# Patient Record
Sex: Male | Born: 1935 | Race: White | Hispanic: No | Smoking: Never smoker
Health system: Southern US, Community
[De-identification: ages and names within clinical notes are randomized; demographics above are authoritative.]

## PROBLEM LIST (undated history)

## (undated) DIAGNOSIS — I499 Cardiac arrhythmia, unspecified: Secondary | ICD-10-CM

## (undated) DIAGNOSIS — K219 Gastro-esophageal reflux disease without esophagitis: Secondary | ICD-10-CM

## (undated) DIAGNOSIS — M1990 Unspecified osteoarthritis, unspecified site: Secondary | ICD-10-CM

## (undated) DIAGNOSIS — H04123 Dry eye syndrome of bilateral lacrimal glands: Secondary | ICD-10-CM

## (undated) DIAGNOSIS — E119 Type 2 diabetes mellitus without complications: Secondary | ICD-10-CM

## (undated) DIAGNOSIS — N390 Urinary tract infection, site not specified: Secondary | ICD-10-CM

## (undated) DIAGNOSIS — R011 Cardiac murmur, unspecified: Secondary | ICD-10-CM

## (undated) DIAGNOSIS — Z9221 Personal history of antineoplastic chemotherapy: Secondary | ICD-10-CM

## (undated) DIAGNOSIS — N400 Enlarged prostate without lower urinary tract symptoms: Secondary | ICD-10-CM

## (undated) DIAGNOSIS — R682 Dry mouth, unspecified: Secondary | ICD-10-CM

## (undated) DIAGNOSIS — K4090 Unilateral inguinal hernia, without obstruction or gangrene, not specified as recurrent: Secondary | ICD-10-CM

## (undated) DIAGNOSIS — H9319 Tinnitus, unspecified ear: Secondary | ICD-10-CM

## (undated) DIAGNOSIS — I2510 Atherosclerotic heart disease of native coronary artery without angina pectoris: Secondary | ICD-10-CM

## (undated) DIAGNOSIS — K117 Disturbances of salivary secretion: Secondary | ICD-10-CM

## (undated) DIAGNOSIS — C4490 Unspecified malignant neoplasm of skin, unspecified: Secondary | ICD-10-CM

## (undated) DIAGNOSIS — I4891 Unspecified atrial fibrillation: Secondary | ICD-10-CM

## (undated) DIAGNOSIS — E785 Hyperlipidemia, unspecified: Secondary | ICD-10-CM

## (undated) DIAGNOSIS — T7840XA Allergy, unspecified, initial encounter: Secondary | ICD-10-CM

## (undated) DIAGNOSIS — I639 Cerebral infarction, unspecified: Secondary | ICD-10-CM

## (undated) DIAGNOSIS — H9190 Unspecified hearing loss, unspecified ear: Secondary | ICD-10-CM

## (undated) DIAGNOSIS — I10 Essential (primary) hypertension: Secondary | ICD-10-CM

## (undated) DIAGNOSIS — Z923 Personal history of irradiation: Secondary | ICD-10-CM

## (undated) DIAGNOSIS — C679 Malignant neoplasm of bladder, unspecified: Secondary | ICD-10-CM

## (undated) DIAGNOSIS — Z950 Presence of cardiac pacemaker: Secondary | ICD-10-CM

## (undated) DIAGNOSIS — J189 Pneumonia, unspecified organism: Secondary | ICD-10-CM

## (undated) DIAGNOSIS — N189 Chronic kidney disease, unspecified: Secondary | ICD-10-CM

## (undated) DIAGNOSIS — C801 Malignant (primary) neoplasm, unspecified: Secondary | ICD-10-CM

## (undated) HISTORY — DX: Dry mouth, unspecified: R68.2

## (undated) HISTORY — DX: Allergy, unspecified, initial encounter: T78.40XA

## (undated) HISTORY — DX: Cardiac murmur, unspecified: R01.1

## (undated) HISTORY — DX: Malignant (primary) neoplasm, unspecified: C80.1

## (undated) HISTORY — DX: Personal history of irradiation: Z92.3

## (undated) HISTORY — DX: Unspecified atrial fibrillation: I48.91

## (undated) HISTORY — PX: BACK SURGERY: SHX140

## (undated) HISTORY — DX: Disturbances of salivary secretion: K11.7

## (undated) HISTORY — DX: Personal history of antineoplastic chemotherapy: Z92.21

## (undated) HISTORY — DX: Hyperlipidemia, unspecified: E78.5

## (undated) HISTORY — DX: Unspecified osteoarthritis, unspecified site: M19.90

## (undated) HISTORY — PX: PAROTIDECTOMY: SUR1003

## (undated) HISTORY — DX: Essential (primary) hypertension: I10

## (undated) HISTORY — DX: Malignant neoplasm of bladder, unspecified: C67.9

## (undated) HISTORY — DX: Benign prostatic hyperplasia without lower urinary tract symptoms: N40.0

---

## 1978-06-22 HISTORY — PX: NEUROFIBROMA EXCISION: SHX2089

## 1988-06-22 DIAGNOSIS — I4891 Unspecified atrial fibrillation: Secondary | ICD-10-CM

## 1988-06-22 HISTORY — DX: Unspecified atrial fibrillation: I48.91

## 1988-06-22 HISTORY — PX: PACEMAKER INSERTION: SHX728

## 1997-10-19 ENCOUNTER — Emergency Department (HOSPITAL_COMMUNITY): Admission: EM | Admit: 1997-10-19 | Discharge: 1997-10-19 | Payer: Self-pay | Admitting: Emergency Medicine

## 1998-04-04 ENCOUNTER — Emergency Department (HOSPITAL_COMMUNITY): Admission: EM | Admit: 1998-04-04 | Discharge: 1998-04-04 | Payer: Self-pay | Admitting: *Deleted

## 1998-08-30 ENCOUNTER — Other Ambulatory Visit: Admission: RE | Admit: 1998-08-30 | Discharge: 1998-08-30 | Payer: Self-pay | Admitting: Urology

## 1999-04-11 ENCOUNTER — Encounter: Admission: RE | Admit: 1999-04-11 | Discharge: 1999-04-11 | Payer: Self-pay | Admitting: *Deleted

## 2000-05-04 ENCOUNTER — Ambulatory Visit (HOSPITAL_COMMUNITY): Admission: RE | Admit: 2000-05-04 | Discharge: 2000-05-04 | Payer: Self-pay | Admitting: Gastroenterology

## 2000-10-21 ENCOUNTER — Encounter: Payer: Self-pay | Admitting: Cardiovascular Disease

## 2000-10-21 ENCOUNTER — Ambulatory Visit (HOSPITAL_COMMUNITY): Admission: RE | Admit: 2000-10-21 | Discharge: 2000-10-21 | Payer: Self-pay | Admitting: Cardiovascular Disease

## 2001-06-22 DIAGNOSIS — I639 Cerebral infarction, unspecified: Secondary | ICD-10-CM

## 2001-06-22 HISTORY — DX: Cerebral infarction, unspecified: I63.9

## 2001-08-03 ENCOUNTER — Encounter: Payer: Self-pay | Admitting: Emergency Medicine

## 2001-08-04 ENCOUNTER — Inpatient Hospital Stay (HOSPITAL_COMMUNITY): Admission: EM | Admit: 2001-08-04 | Discharge: 2001-08-05 | Payer: Self-pay | Admitting: Emergency Medicine

## 2001-10-04 ENCOUNTER — Inpatient Hospital Stay (HOSPITAL_COMMUNITY): Admission: EM | Admit: 2001-10-04 | Discharge: 2001-10-07 | Payer: Self-pay | Admitting: Emergency Medicine

## 2001-10-04 ENCOUNTER — Encounter: Payer: Self-pay | Admitting: Emergency Medicine

## 2001-10-18 ENCOUNTER — Ambulatory Visit (HOSPITAL_COMMUNITY): Admission: RE | Admit: 2001-10-18 | Discharge: 2001-10-18 | Payer: Self-pay | Admitting: Neurosurgery

## 2001-11-10 ENCOUNTER — Encounter: Admission: RE | Admit: 2001-11-10 | Discharge: 2001-11-10 | Payer: Self-pay | Admitting: Neurosurgery

## 2002-04-20 ENCOUNTER — Ambulatory Visit (HOSPITAL_COMMUNITY): Admission: RE | Admit: 2002-04-20 | Discharge: 2002-04-20 | Payer: Self-pay | Admitting: Gastroenterology

## 2002-05-23 ENCOUNTER — Ambulatory Visit (HOSPITAL_COMMUNITY): Admission: RE | Admit: 2002-05-23 | Discharge: 2002-05-24 | Payer: Self-pay | Admitting: Cardiovascular Disease

## 2002-05-23 ENCOUNTER — Encounter: Payer: Self-pay | Admitting: Cardiovascular Disease

## 2002-05-23 HISTORY — PX: PACEMAKER GENERATOR CHANGE: SHX5998

## 2002-05-24 ENCOUNTER — Encounter: Payer: Self-pay | Admitting: Cardiovascular Disease

## 2003-10-10 ENCOUNTER — Emergency Department (HOSPITAL_COMMUNITY): Admission: EM | Admit: 2003-10-10 | Discharge: 2003-10-10 | Payer: Self-pay | Admitting: Family Medicine

## 2006-12-08 ENCOUNTER — Encounter: Admission: RE | Admit: 2006-12-08 | Discharge: 2006-12-08 | Payer: Self-pay | Admitting: Otolaryngology

## 2007-01-12 ENCOUNTER — Ambulatory Visit (HOSPITAL_COMMUNITY): Admission: RE | Admit: 2007-01-12 | Discharge: 2007-01-13 | Payer: Self-pay | Admitting: Otolaryngology

## 2007-01-12 ENCOUNTER — Encounter (INDEPENDENT_AMBULATORY_CARE_PROVIDER_SITE_OTHER): Payer: Self-pay | Admitting: Otolaryngology

## 2007-01-26 ENCOUNTER — Ambulatory Visit: Payer: Self-pay | Admitting: Oncology

## 2007-02-08 ENCOUNTER — Ambulatory Visit: Admission: RE | Admit: 2007-02-08 | Discharge: 2007-03-15 | Payer: Self-pay | Admitting: Radiation Oncology

## 2007-02-09 LAB — COMPREHENSIVE METABOLIC PANEL
Albumin: 4.1 g/dL (ref 3.5–5.2)
BUN: 14 mg/dL (ref 6–23)
CO2: 29 mEq/L (ref 19–32)
Calcium: 9.6 mg/dL (ref 8.4–10.5)
Chloride: 100 mEq/L (ref 96–112)
Glucose, Bld: 109 mg/dL — ABNORMAL HIGH (ref 70–99)
Potassium: 4.9 mEq/L (ref 3.5–5.3)

## 2007-02-09 LAB — CBC WITH DIFFERENTIAL/PLATELET
Basophils Absolute: 0 10*3/uL (ref 0.0–0.1)
Eosinophils Absolute: 0.1 10*3/uL (ref 0.0–0.5)
HGB: 14.2 g/dL (ref 13.0–17.1)
MCV: 99.3 fL — ABNORMAL HIGH (ref 81.6–98.0)
MONO#: 0.7 10*3/uL (ref 0.1–0.9)
NEUT#: 4.6 10*3/uL (ref 1.5–6.5)
RDW: 15.1 % — ABNORMAL HIGH (ref 11.2–14.6)
lymph#: 1.7 10*3/uL (ref 0.9–3.3)

## 2007-02-11 ENCOUNTER — Ambulatory Visit (HOSPITAL_COMMUNITY): Admission: RE | Admit: 2007-02-11 | Discharge: 2007-02-11 | Payer: Self-pay | Admitting: Oncology

## 2007-02-14 ENCOUNTER — Encounter: Admission: RE | Admit: 2007-02-14 | Discharge: 2007-02-14 | Payer: Self-pay | Admitting: Dentistry

## 2007-02-14 ENCOUNTER — Ambulatory Visit: Payer: Self-pay | Admitting: Dentistry

## 2007-02-16 ENCOUNTER — Ambulatory Visit: Payer: Self-pay | Admitting: Dentistry

## 2007-02-17 ENCOUNTER — Ambulatory Visit (HOSPITAL_COMMUNITY): Admission: RE | Admit: 2007-02-17 | Discharge: 2007-02-17 | Payer: Self-pay | Admitting: Oncology

## 2007-02-18 ENCOUNTER — Ambulatory Visit (HOSPITAL_COMMUNITY): Admission: RE | Admit: 2007-02-18 | Discharge: 2007-02-18 | Payer: Self-pay | Admitting: Oncology

## 2007-02-18 LAB — CBC WITH DIFFERENTIAL/PLATELET
BASO%: 0.5 % (ref 0.0–2.0)
Eosinophils Absolute: 0.1 10*3/uL (ref 0.0–0.5)
HCT: 36.7 % — ABNORMAL LOW (ref 38.7–49.9)
LYMPH%: 27.2 % (ref 14.0–48.0)
MCHC: 35.1 g/dL (ref 32.0–35.9)
MONO#: 0.3 10*3/uL (ref 0.1–0.9)
NEUT%: 64.2 % (ref 40.0–75.0)
Platelets: 165 10*3/uL (ref 145–400)
WBC: 5.3 10*3/uL (ref 4.0–10.0)

## 2007-02-25 LAB — CBC WITH DIFFERENTIAL/PLATELET
Basophils Absolute: 0 10*3/uL (ref 0.0–0.1)
Eosinophils Absolute: 0 10*3/uL (ref 0.0–0.5)
HGB: 13.9 g/dL (ref 13.0–17.1)
MCV: 98.5 fL — ABNORMAL HIGH (ref 81.6–98.0)
MONO#: 0.6 10*3/uL (ref 0.1–0.9)
MONO%: 6.3 % (ref 0.0–13.0)
NEUT#: 7.7 10*3/uL — ABNORMAL HIGH (ref 1.5–6.5)
RBC: 3.95 10*6/uL — ABNORMAL LOW (ref 4.20–5.71)
RDW: 14.2 % (ref 11.2–14.6)
WBC: 10.2 10*3/uL — ABNORMAL HIGH (ref 4.0–10.0)
lymph#: 1.8 10*3/uL (ref 0.9–3.3)

## 2007-03-04 LAB — CBC WITH DIFFERENTIAL/PLATELET
Basophils Absolute: 0 10*3/uL (ref 0.0–0.1)
Eosinophils Absolute: 0 10*3/uL (ref 0.0–0.5)
HCT: 37.5 % — ABNORMAL LOW (ref 38.7–49.9)
HGB: 13.4 g/dL (ref 13.0–17.1)
LYMPH%: 23.7 % (ref 14.0–48.0)
MCV: 97.2 fL (ref 81.6–98.0)
MONO#: 0.5 10*3/uL (ref 0.1–0.9)
MONO%: 6.1 % (ref 0.0–13.0)
NEUT#: 5.9 10*3/uL (ref 1.5–6.5)
NEUT%: 70.2 % (ref 40.0–75.0)
Platelets: 163 10*3/uL (ref 145–400)
RBC: 3.85 10*6/uL — ABNORMAL LOW (ref 4.20–5.71)
WBC: 8.4 10*3/uL (ref 4.0–10.0)

## 2007-03-10 LAB — CBC WITH DIFFERENTIAL/PLATELET
BASO%: 0.2 % (ref 0.0–2.0)
HCT: 36.9 % — ABNORMAL LOW (ref 38.7–49.9)
LYMPH%: 32.6 % (ref 14.0–48.0)
MCHC: 35.1 g/dL (ref 32.0–35.9)
MCV: 98.5 fL — ABNORMAL HIGH (ref 81.6–98.0)
MONO#: 0.7 10*3/uL (ref 0.1–0.9)
MONO%: 12.1 % (ref 0.0–13.0)
NEUT%: 55 % (ref 40.0–75.0)
Platelets: 233 10*3/uL (ref 145–400)
RBC: 3.75 10*6/uL — ABNORMAL LOW (ref 4.20–5.71)
WBC: 6 10*3/uL (ref 4.0–10.0)

## 2007-03-10 LAB — COMPREHENSIVE METABOLIC PANEL
Alkaline Phosphatase: 83 U/L (ref 39–117)
BUN: 12 mg/dL (ref 6–23)
CO2: 26 mEq/L (ref 19–32)
Creatinine, Ser: 0.79 mg/dL (ref 0.40–1.50)
Glucose, Bld: 104 mg/dL — ABNORMAL HIGH (ref 70–99)
Total Bilirubin: 0.5 mg/dL (ref 0.3–1.2)

## 2007-03-10 LAB — LACTATE DEHYDROGENASE: LDH: 153 U/L (ref 94–250)

## 2007-03-13 ENCOUNTER — Ambulatory Visit: Payer: Self-pay | Admitting: Oncology

## 2007-03-17 LAB — CBC WITH DIFFERENTIAL/PLATELET
BASO%: 0 % (ref 0.0–2.0)
Eosinophils Absolute: 0.1 10*3/uL (ref 0.0–0.5)
HCT: 35.6 % — ABNORMAL LOW (ref 38.7–49.9)
HGB: 12.6 g/dL — ABNORMAL LOW (ref 13.0–17.1)
MCHC: 35.4 g/dL (ref 32.0–35.9)
MONO#: 0.9 10*3/uL (ref 0.1–0.9)
NEUT#: 15.5 10*3/uL — ABNORMAL HIGH (ref 1.5–6.5)
NEUT%: 83.4 % — ABNORMAL HIGH (ref 40.0–75.0)
WBC: 18.6 10*3/uL — ABNORMAL HIGH (ref 4.0–10.0)
lymph#: 2.1 10*3/uL (ref 0.9–3.3)

## 2007-03-24 LAB — CBC WITH DIFFERENTIAL/PLATELET
Basophils Absolute: 0.1 10*3/uL (ref 0.0–0.1)
EOS%: 1 % (ref 0.0–7.0)
HCT: 34.1 % — ABNORMAL LOW (ref 38.7–49.9)
HGB: 12.2 g/dL — ABNORMAL LOW (ref 13.0–17.1)
MCH: 34.8 pg — ABNORMAL HIGH (ref 28.0–33.4)
MONO#: 0.4 10*3/uL (ref 0.1–0.9)
NEUT%: 62.6 % (ref 40.0–75.0)
Platelets: 131 10*3/uL — ABNORMAL LOW (ref 145–400)
lymph#: 1.8 10*3/uL (ref 0.9–3.3)

## 2007-04-01 LAB — CBC WITH DIFFERENTIAL/PLATELET
BASO%: 0.1 % (ref 0.0–2.0)
EOS%: 0.6 % (ref 0.0–7.0)
HCT: 33.9 % — ABNORMAL LOW (ref 38.7–49.9)
LYMPH%: 26.8 % (ref 14.0–48.0)
MCH: 35.3 pg — ABNORMAL HIGH (ref 28.0–33.4)
MCHC: 35.3 g/dL (ref 32.0–35.9)
MONO%: 8.5 % (ref 0.0–13.0)
NEUT%: 64 % (ref 40.0–75.0)
Platelets: 177 10*3/uL (ref 145–400)
RBC: 3.39 10*6/uL — ABNORMAL LOW (ref 4.20–5.71)
WBC: 5.7 10*3/uL (ref 4.0–10.0)

## 2007-04-01 LAB — COMPREHENSIVE METABOLIC PANEL
ALT: 13 U/L (ref 0–53)
AST: 18 U/L (ref 0–37)
Alkaline Phosphatase: 88 U/L (ref 39–117)
Creatinine, Ser: 0.8 mg/dL (ref 0.40–1.50)
Sodium: 140 mEq/L (ref 135–145)
Total Bilirubin: 0.8 mg/dL (ref 0.3–1.2)
Total Protein: 6.7 g/dL (ref 6.0–8.3)

## 2007-04-01 LAB — LACTATE DEHYDROGENASE: LDH: 159 U/L (ref 94–250)

## 2007-04-05 ENCOUNTER — Ambulatory Visit: Admission: RE | Admit: 2007-04-05 | Discharge: 2007-06-21 | Payer: Self-pay | Admitting: Radiation Oncology

## 2007-04-07 LAB — CBC WITH DIFFERENTIAL/PLATELET
Basophils Absolute: 0 10*3/uL (ref 0.0–0.1)
EOS%: 0.3 % (ref 0.0–7.0)
HCT: 32.9 % — ABNORMAL LOW (ref 38.7–49.9)
HGB: 11.6 g/dL — ABNORMAL LOW (ref 13.0–17.1)
MCH: 35.3 pg — ABNORMAL HIGH (ref 28.0–33.4)
MCV: 99.9 fL — ABNORMAL HIGH (ref 81.6–98.0)
MONO%: 1 % (ref 0.0–13.0)
NEUT%: 83.1 % — ABNORMAL HIGH (ref 40.0–75.0)

## 2007-04-14 LAB — CBC WITH DIFFERENTIAL/PLATELET
Eosinophils Absolute: 0 10*3/uL (ref 0.0–0.5)
HCT: 31.7 % — ABNORMAL LOW (ref 38.7–49.9)
LYMPH%: 36.9 % (ref 14.0–48.0)
MCHC: 34.9 g/dL (ref 32.0–35.9)
MCV: 100.3 fL — ABNORMAL HIGH (ref 81.6–98.0)
MONO#: 0.3 10*3/uL (ref 0.1–0.9)
MONO%: 7 % (ref 0.0–13.0)
NEUT%: 55.8 % (ref 40.0–75.0)
Platelets: 147 10*3/uL (ref 145–400)
RBC: 3.16 10*6/uL — ABNORMAL LOW (ref 4.20–5.71)
WBC: 5 10*3/uL (ref 4.0–10.0)

## 2007-04-22 LAB — CBC WITH DIFFERENTIAL/PLATELET
Basophils Absolute: 0 10*3/uL (ref 0.0–0.1)
EOS%: 0.3 % (ref 0.0–7.0)
HCT: 30.1 % — ABNORMAL LOW (ref 38.7–49.9)
HGB: 10.9 g/dL — ABNORMAL LOW (ref 13.0–17.1)
LYMPH%: 39.5 % (ref 14.0–48.0)
MCH: 36.7 pg — ABNORMAL HIGH (ref 28.0–33.4)
MCV: 101.6 fL — ABNORMAL HIGH (ref 81.6–98.0)
NEUT%: 52.7 % (ref 40.0–75.0)
Platelets: 107 10*3/uL — ABNORMAL LOW (ref 145–400)
lymph#: 1.9 10*3/uL (ref 0.9–3.3)

## 2007-04-22 LAB — COMPREHENSIVE METABOLIC PANEL
AST: 17 U/L (ref 0–37)
BUN: 15 mg/dL (ref 6–23)
Calcium: 8.9 mg/dL (ref 8.4–10.5)
Chloride: 104 mEq/L (ref 96–112)
Creatinine, Ser: 0.82 mg/dL (ref 0.40–1.50)
Total Bilirubin: 0.7 mg/dL (ref 0.3–1.2)

## 2007-04-28 ENCOUNTER — Ambulatory Visit: Payer: Self-pay | Admitting: Oncology

## 2007-04-28 LAB — CBC WITH DIFFERENTIAL/PLATELET
Basophils Absolute: 0 10*3/uL (ref 0.0–0.1)
Eosinophils Absolute: 0 10*3/uL (ref 0.0–0.5)
HCT: 31.2 % — ABNORMAL LOW (ref 38.7–49.9)
HGB: 11 g/dL — ABNORMAL LOW (ref 13.0–17.1)
LYMPH%: 30.9 % (ref 14.0–48.0)
MCV: 103 fL — ABNORMAL HIGH (ref 81.6–98.0)
MONO#: 0.5 10*3/uL (ref 0.1–0.9)
NEUT#: 2.7 10*3/uL (ref 1.5–6.5)
Platelets: 312 10*3/uL (ref 145–400)
RBC: 3.03 10*6/uL — ABNORMAL LOW (ref 4.20–5.71)
WBC: 4.7 10*3/uL (ref 4.0–10.0)

## 2007-05-04 LAB — COMPREHENSIVE METABOLIC PANEL
CO2: 27 mEq/L (ref 19–32)
Creatinine, Ser: 0.76 mg/dL (ref 0.40–1.50)
Glucose, Bld: 95 mg/dL (ref 70–99)
Total Bilirubin: 1.3 mg/dL — ABNORMAL HIGH (ref 0.3–1.2)

## 2007-05-04 LAB — CBC WITH DIFFERENTIAL/PLATELET
BASO%: 0.3 % (ref 0.0–2.0)
Eosinophils Absolute: 0 10*3/uL (ref 0.0–0.5)
HCT: 30.9 % — ABNORMAL LOW (ref 38.7–49.9)
LYMPH%: 21.1 % (ref 14.0–48.0)
MCHC: 35.4 g/dL (ref 32.0–35.9)
MCV: 105 fL — ABNORMAL HIGH (ref 81.6–98.0)
MONO#: 0.5 10*3/uL (ref 0.1–0.9)
MONO%: 11.2 % (ref 0.0–13.0)
NEUT%: 67 % (ref 40.0–75.0)
Platelets: 310 10*3/uL (ref 145–400)
WBC: 4.9 10*3/uL (ref 4.0–10.0)

## 2007-05-04 LAB — LACTATE DEHYDROGENASE: LDH: 172 U/L (ref 94–250)

## 2007-05-04 LAB — VITAMIN B12: Vitamin B-12: 1365 pg/mL — ABNORMAL HIGH (ref 211–911)

## 2007-05-12 LAB — CBC WITH DIFFERENTIAL/PLATELET
BASO%: 0.5 % (ref 0.0–2.0)
HCT: 33 % — ABNORMAL LOW (ref 38.7–49.9)
HGB: 11.8 g/dL — ABNORMAL LOW (ref 13.0–17.1)
MCHC: 35.7 g/dL (ref 32.0–35.9)
MONO#: 0.5 10*3/uL (ref 0.1–0.9)
NEUT%: 65.5 % (ref 40.0–75.0)
RDW: 20.5 % — ABNORMAL HIGH (ref 11.2–14.6)
WBC: 4.1 10*3/uL (ref 4.0–10.0)
lymph#: 0.9 10*3/uL (ref 0.9–3.3)

## 2007-05-23 LAB — COMPREHENSIVE METABOLIC PANEL
Albumin: 4.2 g/dL (ref 3.5–5.2)
BUN: 16 mg/dL (ref 6–23)
CO2: 26 mEq/L (ref 19–32)
Calcium: 9.6 mg/dL (ref 8.4–10.5)
Chloride: 102 mEq/L (ref 96–112)
Creatinine, Ser: 0.78 mg/dL (ref 0.40–1.50)
Glucose, Bld: 107 mg/dL — ABNORMAL HIGH (ref 70–99)
Potassium: 4.4 mEq/L (ref 3.5–5.3)

## 2007-05-23 LAB — CBC WITH DIFFERENTIAL/PLATELET
Basophils Absolute: 0 10*3/uL (ref 0.0–0.1)
EOS%: 0.4 % (ref 0.0–7.0)
Eosinophils Absolute: 0 10*3/uL (ref 0.0–0.5)
HCT: 31.3 % — ABNORMAL LOW (ref 38.7–49.9)
HGB: 11.2 g/dL — ABNORMAL LOW (ref 13.0–17.1)
MCH: 38.8 pg — ABNORMAL HIGH (ref 28.0–33.4)
MONO#: 0.4 10*3/uL (ref 0.1–0.9)
NEUT#: 2.8 10*3/uL (ref 1.5–6.5)
NEUT%: 70.2 % (ref 40.0–75.0)
lymph#: 0.7 10*3/uL — ABNORMAL LOW (ref 0.9–3.3)

## 2007-05-23 LAB — LACTATE DEHYDROGENASE: LDH: 157 U/L (ref 94–250)

## 2007-05-31 LAB — CBC WITH DIFFERENTIAL/PLATELET
Basophils Absolute: 0 10*3/uL (ref 0.0–0.1)
Eosinophils Absolute: 0 10*3/uL (ref 0.0–0.5)
HGB: 11.3 g/dL — ABNORMAL LOW (ref 13.0–17.1)
NEUT#: 2.4 10*3/uL (ref 1.5–6.5)
RBC: 2.85 10*6/uL — ABNORMAL LOW (ref 4.20–5.71)
RDW: 17.2 % — ABNORMAL HIGH (ref 11.2–14.6)
WBC: 3.5 10*3/uL — ABNORMAL LOW (ref 4.0–10.0)
lymph#: 0.7 10*3/uL — ABNORMAL LOW (ref 0.9–3.3)

## 2007-06-08 LAB — CBC WITH DIFFERENTIAL/PLATELET
BASO%: 0.2 % (ref 0.0–2.0)
EOS%: 1.2 % (ref 0.0–7.0)
HCT: 30 % — ABNORMAL LOW (ref 38.7–49.9)
HGB: 10.7 g/dL — ABNORMAL LOW (ref 13.0–17.1)
MCH: 39.8 pg — ABNORMAL HIGH (ref 28.0–33.4)
MCHC: 35.8 g/dL (ref 32.0–35.9)
MONO#: 0.2 10*3/uL (ref 0.1–0.9)
NEUT%: 66.7 % (ref 40.0–75.0)
RDW: 15.9 % — ABNORMAL HIGH (ref 11.2–14.6)
WBC: 2.7 10*3/uL — ABNORMAL LOW (ref 4.0–10.0)
lymph#: 0.6 10*3/uL — ABNORMAL LOW (ref 0.9–3.3)

## 2007-06-15 LAB — CBC WITH DIFFERENTIAL/PLATELET
Basophils Absolute: 0 10*3/uL (ref 0.0–0.1)
EOS%: 0.7 % (ref 0.0–7.0)
Eosinophils Absolute: 0 10*3/uL (ref 0.0–0.5)
HCT: 31.9 % — ABNORMAL LOW (ref 38.7–49.9)
HGB: 11.4 g/dL — ABNORMAL LOW (ref 13.0–17.1)
MCH: 39.7 pg — ABNORMAL HIGH (ref 28.0–33.4)
MONO#: 0.3 10*3/uL (ref 0.1–0.9)
NEUT#: 2 10*3/uL (ref 1.5–6.5)
NEUT%: 64.2 % (ref 40.0–75.0)
RDW: 15.9 % — ABNORMAL HIGH (ref 11.2–14.6)
lymph#: 0.7 10*3/uL — ABNORMAL LOW (ref 0.9–3.3)

## 2007-07-06 ENCOUNTER — Ambulatory Visit: Payer: Self-pay | Admitting: Oncology

## 2007-07-08 LAB — CBC WITH DIFFERENTIAL/PLATELET
Basophils Absolute: 0 10*3/uL (ref 0.0–0.1)
EOS%: 1.1 % (ref 0.0–7.0)
Eosinophils Absolute: 0 10*3/uL (ref 0.0–0.5)
HCT: 31.5 % — ABNORMAL LOW (ref 38.7–49.9)
HGB: 10.7 g/dL — ABNORMAL LOW (ref 13.0–17.1)
LYMPH%: 20.8 % (ref 14.0–48.0)
MCH: 37.7 pg — ABNORMAL HIGH (ref 28.0–33.4)
MCV: 110.9 fL — ABNORMAL HIGH (ref 81.6–98.0)
MONO%: 13 % (ref 0.0–13.0)
NEUT#: 2.4 10*3/uL (ref 1.5–6.5)
NEUT%: 64.9 % (ref 40.0–75.0)
Platelets: 127 10*3/uL — ABNORMAL LOW (ref 145–400)
RDW: 15.1 % — ABNORMAL HIGH (ref 11.2–14.6)

## 2007-07-08 LAB — COMPREHENSIVE METABOLIC PANEL
AST: 17 U/L (ref 0–37)
Albumin: 3.9 g/dL (ref 3.5–5.2)
Alkaline Phosphatase: 69 U/L (ref 39–117)
BUN: 15 mg/dL (ref 6–23)
Creatinine, Ser: 0.86 mg/dL (ref 0.40–1.50)
Glucose, Bld: 103 mg/dL — ABNORMAL HIGH (ref 70–99)
Potassium: 4.2 mEq/L (ref 3.5–5.3)

## 2007-08-03 ENCOUNTER — Ambulatory Visit (HOSPITAL_COMMUNITY): Admission: RE | Admit: 2007-08-03 | Discharge: 2007-08-03 | Payer: Self-pay | Admitting: Oncology

## 2007-08-08 LAB — COMPREHENSIVE METABOLIC PANEL
AST: 18 U/L (ref 0–37)
BUN: 16 mg/dL (ref 6–23)
Calcium: 9.3 mg/dL (ref 8.4–10.5)
Chloride: 102 mEq/L (ref 96–112)
Creatinine, Ser: 0.86 mg/dL (ref 0.40–1.50)

## 2007-08-08 LAB — CBC WITH DIFFERENTIAL/PLATELET
Basophils Absolute: 0 10*3/uL (ref 0.0–0.1)
EOS%: 1.2 % (ref 0.0–7.0)
HCT: 36.1 % — ABNORMAL LOW (ref 38.7–49.9)
HGB: 11.6 g/dL — ABNORMAL LOW (ref 13.0–17.1)
MCH: 35.3 pg — ABNORMAL HIGH (ref 28.0–33.4)
MCV: 109.8 fL — ABNORMAL HIGH (ref 81.6–98.0)
NEUT%: 66.5 % (ref 40.0–75.0)
Platelets: 186 10*3/uL (ref 145–400)
lymph#: 0.9 10*3/uL (ref 0.9–3.3)

## 2007-08-08 LAB — LACTATE DEHYDROGENASE: LDH: 163 U/L (ref 94–250)

## 2007-09-28 ENCOUNTER — Ambulatory Visit: Payer: Self-pay | Admitting: Oncology

## 2007-10-03 LAB — CBC WITH DIFFERENTIAL/PLATELET
BASO%: 0.1 % (ref 0.0–2.0)
Basophils Absolute: 0 10*3/uL (ref 0.0–0.1)
EOS%: 2 % (ref 0.0–7.0)
HCT: 34.1 % — ABNORMAL LOW (ref 38.7–49.9)
LYMPH%: 34 % (ref 14.0–48.0)
MCH: 37.8 pg — ABNORMAL HIGH (ref 28.0–33.4)
MCHC: 35.6 g/dL (ref 32.0–35.9)
MCV: 106.1 fL — ABNORMAL HIGH (ref 81.6–98.0)
MONO%: 8.3 % (ref 0.0–13.0)
NEUT%: 55.6 % (ref 40.0–75.0)
Platelets: 152 10*3/uL (ref 145–400)
lymph#: 1.2 10*3/uL (ref 0.9–3.3)

## 2007-10-03 LAB — COMPREHENSIVE METABOLIC PANEL
ALT: 13 U/L (ref 0–53)
AST: 16 U/L (ref 0–37)
BUN: 11 mg/dL (ref 6–23)
Creatinine, Ser: 0.78 mg/dL (ref 0.40–1.50)
Total Bilirubin: 0.6 mg/dL (ref 0.3–1.2)

## 2007-11-23 ENCOUNTER — Ambulatory Visit: Payer: Self-pay | Admitting: Oncology

## 2007-11-25 LAB — LACTATE DEHYDROGENASE: LDH: 154 U/L (ref 94–250)

## 2007-11-25 LAB — CBC WITH DIFFERENTIAL/PLATELET
BASO%: 0.2 % (ref 0.0–2.0)
HCT: 34.4 % — ABNORMAL LOW (ref 38.7–49.9)
LYMPH%: 27.4 % (ref 14.0–48.0)
MCH: 36.6 pg — ABNORMAL HIGH (ref 28.0–33.4)
MCHC: 35.3 g/dL (ref 32.0–35.9)
MONO#: 0.3 10*3/uL (ref 0.1–0.9)
NEUT%: 62.8 % (ref 40.0–75.0)
Platelets: 147 10*3/uL (ref 145–400)
WBC: 3.8 10*3/uL — ABNORMAL LOW (ref 4.0–10.0)

## 2007-11-25 LAB — COMPREHENSIVE METABOLIC PANEL
ALT: 12 U/L (ref 0–53)
AST: 21 U/L (ref 0–37)
CO2: 27 mEq/L (ref 19–32)
Sodium: 141 mEq/L (ref 135–145)
Total Bilirubin: 1 mg/dL (ref 0.3–1.2)
Total Protein: 7 g/dL (ref 6.0–8.3)

## 2008-01-26 ENCOUNTER — Ambulatory Visit: Payer: Self-pay | Admitting: Oncology

## 2008-01-30 LAB — CBC WITH DIFFERENTIAL/PLATELET
BASO%: 0.2 % (ref 0.0–2.0)
EOS%: 1.8 % (ref 0.0–7.0)
LYMPH%: 29 % (ref 14.0–48.0)
MCHC: 35 g/dL (ref 32.0–35.9)
MCV: 105 fL — ABNORMAL HIGH (ref 81.6–98.0)
MONO%: 11.6 % (ref 0.0–13.0)
Platelets: 152 10*3/uL (ref 145–400)
RBC: 3.57 10*6/uL — ABNORMAL LOW (ref 4.20–5.71)

## 2008-01-30 LAB — COMPREHENSIVE METABOLIC PANEL
ALT: 15 U/L (ref 0–53)
AST: 19 U/L (ref 0–37)
Alkaline Phosphatase: 72 U/L (ref 39–117)
Sodium: 140 mEq/L (ref 135–145)
Total Bilirubin: 1 mg/dL (ref 0.3–1.2)
Total Protein: 7 g/dL (ref 6.0–8.3)

## 2008-03-28 ENCOUNTER — Ambulatory Visit: Payer: Self-pay | Admitting: Oncology

## 2008-03-30 LAB — COMPREHENSIVE METABOLIC PANEL
Albumin: 4.2 g/dL (ref 3.5–5.2)
CO2: 23 mEq/L (ref 19–32)
Calcium: 9.1 mg/dL (ref 8.4–10.5)
Glucose, Bld: 95 mg/dL (ref 70–99)
Potassium: 3.8 mEq/L (ref 3.5–5.3)
Sodium: 138 mEq/L (ref 135–145)
Total Protein: 7 g/dL (ref 6.0–8.3)

## 2008-03-30 LAB — CBC WITH DIFFERENTIAL/PLATELET
Eosinophils Absolute: 0.1 10*3/uL (ref 0.0–0.5)
HGB: 12.9 g/dL — ABNORMAL LOW (ref 13.0–17.1)
MONO#: 0.5 10*3/uL (ref 0.1–0.9)
NEUT#: 4.5 10*3/uL (ref 1.5–6.5)
Platelets: 162 10*3/uL (ref 145–400)
RBC: 3.57 10*6/uL — ABNORMAL LOW (ref 4.20–5.71)
RDW: 14.2 % (ref 11.2–14.6)
WBC: 6.4 10*3/uL (ref 4.0–10.0)

## 2008-05-10 ENCOUNTER — Ambulatory Visit: Admission: RE | Admit: 2008-05-10 | Discharge: 2008-05-10 | Payer: Self-pay | Admitting: Radiation Oncology

## 2008-05-14 ENCOUNTER — Ambulatory Visit (HOSPITAL_BASED_OUTPATIENT_CLINIC_OR_DEPARTMENT_OTHER): Admission: RE | Admit: 2008-05-14 | Discharge: 2008-05-15 | Payer: Self-pay | Admitting: Urology

## 2008-05-14 ENCOUNTER — Encounter (INDEPENDENT_AMBULATORY_CARE_PROVIDER_SITE_OTHER): Payer: Self-pay | Admitting: Urology

## 2008-05-17 ENCOUNTER — Emergency Department (HOSPITAL_COMMUNITY): Admission: EM | Admit: 2008-05-17 | Discharge: 2008-05-17 | Payer: Self-pay | Admitting: *Deleted

## 2008-05-17 ENCOUNTER — Emergency Department (HOSPITAL_COMMUNITY): Admission: EM | Admit: 2008-05-17 | Discharge: 2008-05-17 | Payer: Self-pay | Admitting: Emergency Medicine

## 2008-05-24 ENCOUNTER — Ambulatory Visit: Payer: Self-pay | Admitting: Oncology

## 2008-06-18 ENCOUNTER — Encounter (INDEPENDENT_AMBULATORY_CARE_PROVIDER_SITE_OTHER): Payer: Self-pay | Admitting: Urology

## 2008-06-18 ENCOUNTER — Ambulatory Visit (HOSPITAL_BASED_OUTPATIENT_CLINIC_OR_DEPARTMENT_OTHER): Admission: RE | Admit: 2008-06-18 | Discharge: 2008-06-19 | Payer: Self-pay | Admitting: Urology

## 2008-06-22 DIAGNOSIS — C679 Malignant neoplasm of bladder, unspecified: Secondary | ICD-10-CM

## 2008-06-22 HISTORY — DX: Malignant neoplasm of bladder, unspecified: C67.9

## 2008-08-01 ENCOUNTER — Ambulatory Visit: Payer: Self-pay | Admitting: Oncology

## 2008-08-03 LAB — CBC WITH DIFFERENTIAL/PLATELET
Basophils Absolute: 0 10*3/uL (ref 0.0–0.1)
Eosinophils Absolute: 0.1 10*3/uL (ref 0.0–0.5)
HCT: 37.3 % — ABNORMAL LOW (ref 38.7–49.9)
HGB: 12.6 g/dL — ABNORMAL LOW (ref 13.0–17.1)
MCH: 33.9 pg — ABNORMAL HIGH (ref 28.0–33.4)
MCV: 100.2 fL — ABNORMAL HIGH (ref 81.6–98.0)
MONO%: 9.1 % (ref 0.0–13.0)
NEUT#: 3.1 10*3/uL (ref 1.5–6.5)
NEUT%: 61.4 % (ref 40.0–75.0)
Platelets: 175 10*3/uL (ref 145–400)
RDW: 14.9 % — ABNORMAL HIGH (ref 11.2–14.6)

## 2008-08-03 LAB — COMPREHENSIVE METABOLIC PANEL
Albumin: 4.4 g/dL (ref 3.5–5.2)
Alkaline Phosphatase: 97 U/L (ref 39–117)
BUN: 17 mg/dL (ref 6–23)
Calcium: 9.7 mg/dL (ref 8.4–10.5)
Creatinine, Ser: 0.96 mg/dL (ref 0.40–1.50)
Glucose, Bld: 90 mg/dL (ref 70–99)
Potassium: 4.6 mEq/L (ref 3.5–5.3)

## 2008-08-08 ENCOUNTER — Ambulatory Visit (HOSPITAL_COMMUNITY): Admission: RE | Admit: 2008-08-08 | Discharge: 2008-08-08 | Payer: Self-pay | Admitting: Oncology

## 2008-09-27 ENCOUNTER — Ambulatory Visit: Payer: Self-pay | Admitting: Oncology

## 2008-10-01 LAB — COMPREHENSIVE METABOLIC PANEL
AST: 20 U/L (ref 0–37)
Albumin: 4.1 g/dL (ref 3.5–5.2)
Alkaline Phosphatase: 116 U/L (ref 39–117)
BUN: 18 mg/dL (ref 6–23)
Glucose, Bld: 97 mg/dL (ref 70–99)
Potassium: 4.8 mEq/L (ref 3.5–5.3)
Total Bilirubin: 0.9 mg/dL (ref 0.3–1.2)

## 2008-10-01 LAB — CBC WITH DIFFERENTIAL/PLATELET
Basophils Absolute: 0 10*3/uL (ref 0.0–0.1)
EOS%: 3 % (ref 0.0–7.0)
Eosinophils Absolute: 0.2 10*3/uL (ref 0.0–0.5)
HGB: 13 g/dL (ref 13.0–17.1)
LYMPH%: 26 % (ref 14.0–49.0)
MCH: 33.1 pg (ref 27.2–33.4)
MCV: 97.6 fL (ref 79.3–98.0)
MONO%: 10.2 % (ref 0.0–14.0)
Platelets: 180 10*3/uL (ref 140–400)
RBC: 3.94 10*6/uL — ABNORMAL LOW (ref 4.20–5.82)
RDW: 16.9 % — ABNORMAL HIGH (ref 11.0–14.6)

## 2008-11-02 ENCOUNTER — Encounter (INDEPENDENT_AMBULATORY_CARE_PROVIDER_SITE_OTHER): Payer: Self-pay | Admitting: Urology

## 2008-11-02 ENCOUNTER — Ambulatory Visit (HOSPITAL_BASED_OUTPATIENT_CLINIC_OR_DEPARTMENT_OTHER): Admission: RE | Admit: 2008-11-02 | Discharge: 2008-11-02 | Payer: Self-pay | Admitting: Urology

## 2008-11-22 ENCOUNTER — Ambulatory Visit: Payer: Self-pay | Admitting: Oncology

## 2008-11-26 LAB — COMPREHENSIVE METABOLIC PANEL
ALT: 15 U/L (ref 0–53)
AST: 22 U/L (ref 0–37)
Albumin: 4.1 g/dL (ref 3.5–5.2)
Alkaline Phosphatase: 97 U/L (ref 39–117)
Calcium: 9.6 mg/dL (ref 8.4–10.5)
Chloride: 103 mEq/L (ref 96–112)
Potassium: 4.3 mEq/L (ref 3.5–5.3)

## 2008-11-26 LAB — CBC WITH DIFFERENTIAL/PLATELET
BASO%: 0.5 % (ref 0.0–2.0)
EOS%: 4.3 % (ref 0.0–7.0)
MCH: 33.6 pg — ABNORMAL HIGH (ref 27.2–33.4)
MCHC: 34.7 g/dL (ref 32.0–36.0)
MONO%: 12.4 % (ref 0.0–14.0)
RBC: 3.96 10*6/uL — ABNORMAL LOW (ref 4.20–5.82)
RDW: 15.5 % — ABNORMAL HIGH (ref 11.0–14.6)
lymph#: 1.5 10*3/uL (ref 0.9–3.3)

## 2009-01-23 ENCOUNTER — Ambulatory Visit: Payer: Self-pay | Admitting: Oncology

## 2009-03-21 ENCOUNTER — Ambulatory Visit: Payer: Self-pay | Admitting: Oncology

## 2009-03-25 LAB — COMPREHENSIVE METABOLIC PANEL
ALT: 9 U/L (ref 0–53)
Albumin: 4.1 g/dL (ref 3.5–5.2)
CO2: 27 mEq/L (ref 19–32)
Calcium: 9.4 mg/dL (ref 8.4–10.5)
Chloride: 101 mEq/L (ref 96–112)
Creatinine, Ser: 0.96 mg/dL (ref 0.40–1.50)
Potassium: 4.6 mEq/L (ref 3.5–5.3)
Sodium: 139 mEq/L (ref 135–145)
Total Protein: 6.5 g/dL (ref 6.0–8.3)

## 2009-03-25 LAB — CBC WITH DIFFERENTIAL/PLATELET
BASO%: 0.3 % (ref 0.0–2.0)
HCT: 38.2 % — ABNORMAL LOW (ref 38.4–49.9)
HGB: 13.2 g/dL (ref 13.0–17.1)
MCHC: 34.6 g/dL (ref 32.0–36.0)
MONO#: 0.6 10*3/uL (ref 0.1–0.9)
NEUT%: 65 % (ref 39.0–75.0)
WBC: 5.4 10*3/uL (ref 4.0–10.3)
lymph#: 1.2 10*3/uL (ref 0.9–3.3)

## 2009-03-25 LAB — LACTATE DEHYDROGENASE: LDH: 146 U/L (ref 94–250)

## 2009-05-14 ENCOUNTER — Ambulatory Visit: Payer: Self-pay | Admitting: Oncology

## 2009-06-05 ENCOUNTER — Ambulatory Visit: Admission: RE | Admit: 2009-06-05 | Discharge: 2009-06-05 | Payer: Self-pay | Admitting: Radiation Oncology

## 2009-06-05 LAB — TSH: TSH: 7.092 u[IU]/mL — ABNORMAL HIGH (ref 0.350–4.500)

## 2009-07-11 ENCOUNTER — Ambulatory Visit: Payer: Self-pay | Admitting: Oncology

## 2009-07-15 LAB — COMPREHENSIVE METABOLIC PANEL
ALT: 17 U/L (ref 0–53)
AST: 24 U/L (ref 0–37)
CO2: 25 mEq/L (ref 19–32)
Creatinine, Ser: 0.84 mg/dL (ref 0.40–1.50)
Total Bilirubin: 1.2 mg/dL (ref 0.3–1.2)

## 2009-07-15 LAB — CBC WITH DIFFERENTIAL/PLATELET
BASO%: 0.2 % (ref 0.0–2.0)
EOS%: 2 % (ref 0.0–7.0)
HCT: 38.8 % (ref 38.4–49.9)
LYMPH%: 29.2 % (ref 14.0–49.0)
MCH: 34.8 pg — ABNORMAL HIGH (ref 27.2–33.4)
MCHC: 33.9 g/dL (ref 32.0–36.0)
MCV: 102.6 fL — ABNORMAL HIGH (ref 79.3–98.0)
NEUT%: 58.3 % (ref 39.0–75.0)
Platelets: 168 10*3/uL (ref 140–400)

## 2009-07-15 LAB — LACTATE DEHYDROGENASE: LDH: 179 U/L (ref 94–250)

## 2009-09-05 HISTORY — PX: CARDIOVASCULAR STRESS TEST: SHX262

## 2009-09-09 ENCOUNTER — Ambulatory Visit (HOSPITAL_COMMUNITY): Admission: RE | Admit: 2009-09-09 | Discharge: 2009-09-09 | Payer: Self-pay | Admitting: Oncology

## 2009-09-10 ENCOUNTER — Ambulatory Visit: Payer: Self-pay | Admitting: Oncology

## 2009-09-12 LAB — CBC WITH DIFFERENTIAL/PLATELET
BASO%: 0.1 % (ref 0.0–2.0)
EOS%: 2 % (ref 0.0–7.0)
MCH: 34.8 pg — ABNORMAL HIGH (ref 27.2–33.4)
MCHC: 34.3 g/dL (ref 32.0–36.0)
MCV: 101.5 fL — ABNORMAL HIGH (ref 79.3–98.0)
MONO%: 9.6 % (ref 0.0–14.0)
RDW: 15 % — ABNORMAL HIGH (ref 11.0–14.6)
lymph#: 1.6 10*3/uL (ref 0.9–3.3)

## 2009-09-12 LAB — COMPREHENSIVE METABOLIC PANEL
AST: 21 U/L (ref 0–37)
Albumin: 4.3 g/dL (ref 3.5–5.2)
Alkaline Phosphatase: 133 U/L — ABNORMAL HIGH (ref 39–117)
BUN: 13 mg/dL (ref 6–23)
Potassium: 4.1 mEq/L (ref 3.5–5.3)
Sodium: 141 mEq/L (ref 135–145)
Total Bilirubin: 1.2 mg/dL (ref 0.3–1.2)
Total Protein: 7.3 g/dL (ref 6.0–8.3)

## 2009-11-06 ENCOUNTER — Ambulatory Visit: Payer: Self-pay | Admitting: Oncology

## 2009-12-31 ENCOUNTER — Ambulatory Visit: Payer: Self-pay | Admitting: Oncology

## 2010-01-02 LAB — COMPREHENSIVE METABOLIC PANEL
BUN: 12 mg/dL (ref 6–23)
CO2: 27 mEq/L (ref 19–32)
Calcium: 9 mg/dL (ref 8.4–10.5)
Chloride: 102 mEq/L (ref 96–112)
Creatinine, Ser: 0.96 mg/dL (ref 0.40–1.50)
Glucose, Bld: 94 mg/dL (ref 70–99)

## 2010-01-02 LAB — CBC WITH DIFFERENTIAL/PLATELET
Basophils Absolute: 0 10*3/uL (ref 0.0–0.1)
EOS%: 3 % (ref 0.0–7.0)
Eosinophils Absolute: 0.1 10*3/uL (ref 0.0–0.5)
HCT: 37.2 % — ABNORMAL LOW (ref 38.4–49.9)
HGB: 12.7 g/dL — ABNORMAL LOW (ref 13.0–17.1)
MCH: 34.6 pg — ABNORMAL HIGH (ref 27.2–33.4)
NEUT%: 56.9 % (ref 39.0–75.0)
lymph#: 1.2 10*3/uL (ref 0.9–3.3)

## 2010-01-02 LAB — T4: T4, Total: 5.3 ug/dL (ref 5.0–12.5)

## 2010-01-02 LAB — TSH: TSH: 4.181 u[IU]/mL (ref 0.350–4.500)

## 2010-01-02 LAB — LACTATE DEHYDROGENASE: LDH: 165 U/L (ref 94–250)

## 2010-01-02 LAB — T4, FREE: Free T4: 1.04 ng/dL (ref 0.80–1.80)

## 2010-02-28 ENCOUNTER — Ambulatory Visit: Payer: Self-pay | Admitting: Oncology

## 2010-05-01 ENCOUNTER — Ambulatory Visit: Payer: Self-pay | Admitting: Oncology

## 2010-05-05 LAB — COMPREHENSIVE METABOLIC PANEL
Alkaline Phosphatase: 103 U/L (ref 39–117)
BUN: 17 mg/dL (ref 6–23)
CO2: 26 mEq/L (ref 19–32)
Creatinine, Ser: 0.99 mg/dL (ref 0.40–1.50)
Glucose, Bld: 93 mg/dL (ref 70–99)
Sodium: 140 mEq/L (ref 135–145)
Total Bilirubin: 1.2 mg/dL (ref 0.3–1.2)

## 2010-05-05 LAB — CBC WITH DIFFERENTIAL/PLATELET
Basophils Absolute: 0 10*3/uL (ref 0.0–0.1)
EOS%: 2.7 % (ref 0.0–7.0)
Eosinophils Absolute: 0.1 10*3/uL (ref 0.0–0.5)
HGB: 13.1 g/dL (ref 13.0–17.1)
LYMPH%: 29.1 % (ref 14.0–49.0)
MCH: 34.7 pg — ABNORMAL HIGH (ref 27.2–33.4)
MCV: 100.3 fL — ABNORMAL HIGH (ref 79.3–98.0)
MONO%: 11 % (ref 0.0–14.0)
NEUT#: 2.3 10*3/uL (ref 1.5–6.5)
Platelets: 148 10*3/uL (ref 140–400)
RDW: 15.6 % — ABNORMAL HIGH (ref 11.0–14.6)

## 2010-05-05 LAB — TSH: TSH: 3.787 u[IU]/mL (ref 0.350–4.500)

## 2010-06-26 ENCOUNTER — Ambulatory Visit: Payer: Self-pay | Admitting: Oncology

## 2010-07-24 ENCOUNTER — Ambulatory Visit: Payer: Self-pay | Admitting: Radiation Oncology

## 2010-07-24 ENCOUNTER — Ambulatory Visit: Payer: Medicare Other | Attending: Radiation Oncology | Admitting: Radiation Oncology

## 2010-08-25 ENCOUNTER — Other Ambulatory Visit (HOSPITAL_COMMUNITY): Payer: Self-pay | Admitting: Oncology

## 2010-08-25 ENCOUNTER — Encounter (HOSPITAL_BASED_OUTPATIENT_CLINIC_OR_DEPARTMENT_OTHER): Payer: Medicare Other | Admitting: Oncology

## 2010-08-25 DIAGNOSIS — Z452 Encounter for adjustment and management of vascular access device: Secondary | ICD-10-CM

## 2010-08-25 DIAGNOSIS — C07 Malignant neoplasm of parotid gland: Secondary | ICD-10-CM

## 2010-08-25 LAB — CBC WITH DIFFERENTIAL/PLATELET
BASO%: 0.9 % (ref 0.0–2.0)
Eosinophils Absolute: 0.1 10*3/uL (ref 0.0–0.5)
HCT: 40.7 % (ref 38.4–49.9)
HGB: 13.9 g/dL (ref 13.0–17.1)
LYMPH%: 37.9 % (ref 14.0–49.0)
MCHC: 34.2 g/dL (ref 32.0–36.0)
MONO#: 0.5 10*3/uL (ref 0.1–0.9)
NEUT#: 2.2 10*3/uL (ref 1.5–6.5)
NEUT%: 47.5 % (ref 39.0–75.0)
Platelets: 144 10*3/uL (ref 140–400)
WBC: 4.6 10*3/uL (ref 4.0–10.3)
lymph#: 1.7 10*3/uL (ref 0.9–3.3)

## 2010-08-25 LAB — COMPREHENSIVE METABOLIC PANEL
ALT: 15 U/L (ref 0–53)
AST: 31 U/L (ref 0–37)
Albumin: 4.4 g/dL (ref 3.5–5.2)
BUN: 16 mg/dL (ref 6–23)
CO2: 21 mEq/L (ref 19–32)
Calcium: 9.2 mg/dL (ref 8.4–10.5)
Chloride: 100 mEq/L (ref 96–112)
Potassium: 3.9 mEq/L (ref 3.5–5.3)

## 2010-08-25 LAB — LACTATE DEHYDROGENASE: LDH: 226 U/L (ref 94–250)

## 2010-09-03 ENCOUNTER — Ambulatory Visit (HOSPITAL_COMMUNITY)
Admission: RE | Admit: 2010-09-03 | Discharge: 2010-09-03 | Disposition: A | Discharge status: Home / Self Care | Payer: Medicare Other | Source: Ambulatory Visit | Attending: Oncology | Admitting: Oncology

## 2010-09-03 ENCOUNTER — Other Ambulatory Visit (HOSPITAL_COMMUNITY): Payer: Self-pay | Admitting: Oncology

## 2010-09-03 DIAGNOSIS — Z950 Presence of cardiac pacemaker: Secondary | ICD-10-CM | POA: Insufficient documentation

## 2010-09-03 DIAGNOSIS — Z85819 Personal history of malignant neoplasm of unspecified site of lip, oral cavity, and pharynx: Secondary | ICD-10-CM | POA: Insufficient documentation

## 2010-09-03 DIAGNOSIS — K118 Other diseases of salivary glands: Secondary | ICD-10-CM

## 2010-09-03 DIAGNOSIS — I517 Cardiomegaly: Secondary | ICD-10-CM | POA: Insufficient documentation

## 2010-09-15 LAB — GLUCOSE, CAPILLARY: Glucose-Capillary: 119 mg/dL — ABNORMAL HIGH (ref 70–99)

## 2010-09-30 LAB — BASIC METABOLIC PANEL
BUN: 14 mg/dL (ref 6–23)
CO2: 30 mEq/L (ref 19–32)
Calcium: 9.5 mg/dL (ref 8.4–10.5)
Chloride: 102 mEq/L (ref 96–112)
Creatinine, Ser: 1 mg/dL (ref 0.4–1.5)
GFR calc Af Amer: >60 mL/min (ref >60)
GFR calc non Af Amer: >60 mL/min (ref >60)
Glucose, Bld: 99 mg/dL (ref 70–99)
Potassium: 4.5 mEq/L (ref 3.5–5.1)
Sodium: 139 mEq/L (ref 135–145)

## 2010-09-30 LAB — GLUCOSE, CAPILLARY
Glucose-Capillary: 80 mg/dL (ref 70–99)
Glucose-Capillary: 98 mg/dL (ref 70–99)

## 2010-09-30 LAB — POCT HEMOGLOBIN-HEMACUE: Hemoglobin: 14 g/dL (ref 13.0–17.0)

## 2010-10-07 LAB — GLUCOSE, CAPILLARY: Glucose-Capillary: 105 mg/dL — ABNORMAL HIGH (ref 70–99)

## 2010-10-30 ENCOUNTER — Encounter (HOSPITAL_BASED_OUTPATIENT_CLINIC_OR_DEPARTMENT_OTHER): Payer: Medicare Other | Admitting: Oncology

## 2010-10-30 DIAGNOSIS — Z452 Encounter for adjustment and management of vascular access device: Secondary | ICD-10-CM

## 2010-10-30 DIAGNOSIS — C07 Malignant neoplasm of parotid gland: Secondary | ICD-10-CM

## 2010-11-04 NOTE — Op Note (Signed)
NAMESAQUAN, [name]              ACCOUNT NO.:  [id number]   MEDICAL RECORD NO.:  [id number]          PATIENT TYPE:  AMB   LOCATION:  [facility]                         FACILITY:  [name]   PHYSICIAN:  [name], M.D.  DATE OF BIRTH:  [birth year]   DATE OF PROCEDURE:  06/18/2008  DATE OF DISCHARGE:                               OPERATIVE REPORT   PREOPERATIVE DIAGNOSES:  1. History of transitional cell carcinoma of the bladder.  2. 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  3. Benign prostatic hypertrophy with bladder outlet obstruction.   POSTOPERATIVE DIAGNOSES:  1. History of transitional cell carcinoma of the bladder.  2. 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  3. Benign prostatic hypertrophy with bladder outlet obstruction.   PROCEDURES:  1. Cystoscopy with bladder biopsy.  2. Transurethral resection of prostate.   SURGEON:  [name], M.D.   ANESTHESIA:  General.   DRAINS:  A 24-French three-way Foley catheter.   SPECIMENS:  1. Cold cup biopsy of left lateral wall of the bladder.  2. Resectional biopsy of the entire prostatic urethral mucosa.  3. The remainder of the resected prostate tissue all to pathology.   BLOOD LOSS:  Approximately 100 mL.   COMPLICATIONS:  None.   INDICATIONS:  The patient is a 77 year old male who I initially found  transitional cell carcinoma of the bladder and he underwent resection of  that in November 2009.  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  Because of the presence of transitional  cell carcinoma of the prostatic urethra, I discussed with the patient  the need to resect the prostatic urethra completely for both diagnostic  and therapeutic reasons which would also allow BCG entrance into the  prostatic urethral region if necessary.  We discussed the risks,  complications and alternatives.  He understands and has elected to  proceed.  He stopped his Plavix 10 days prior to the procedure.   DESCRIPTION OF OPERATION:  After informed consent, the patient brought  to the major OR, placed on the table, administered general anesthesia  and then moved to the dorsal lithotomy position.  His genitalia was  sterilely prepped and draped and I dilated the urethral meatus with Sissy Hoff sounds to 30-French.  I then passed a 28-French resectoscope  sheath with Timberlake obturator per urethra down into the bulbar  urethral region and removed the obturator and inserted the 12 degree  lens with the resectoscope element.  I noted the bulbar urethra appeared  normal, the sphincter intact and the prostatic urethra revealed  elongation with bilobar hypertrophy.  I then entered the bladder and  noted previous area of resection on the left wall.  The ureteral  orifices were of normal position and configuration and well away from  the bladder neck.  I began resecting first at the bladder neck at the 6  o'clock position and resected the prostatic urethral epithelium  circumferentially around at the level of the bladder  neck.  I then  resected from that level on back to the level of the veru and after  visually confirming that all the prostatic urethral mucosa had been  completely resected, I used the Ellik evacuator to then remove these  prostatic chips from the bladder and these were sent separately labeled  as prostatic urethral mucosa.   I then proceeded to resect the prostate in the standard fashion for a  TURP starting at the 6 o'clock position and resecting back to the veru,  first resecting the left lobe down to the prostatic capsule at the 6  o'clock position and then proceeding in a counterclockwise fashion from  the bladder neck back to the veru resecting all adenomatous tissue.  This was then performed on the right side of the prostate and then some   apical tissue was resected as well.  I then fulgurated all areas of  bleeding and used the Ellik evacuator to remove the remaining prostatic  chips.   I then inserted the cold cup biopsy forceps with the 12 degrees lens and  took a cold cup biopsy of the left wall of the bladder and sent this to  pathology separately.   I then reinserted the resectoscope and fulgurated the area of the biopsy  on the left wall of the bladder and then fulgurated any further bleeding  areas of the prostatic urethra which was now noted to be widely patent.  I therefore left the bladder full and removed the resectoscope and  inserted the Foley catheter filling the balloon to 30 mL and connecting  this to closed system drainage, as well as continuous irrigation.  The  patient was awakened and taken to the recovery room in stable  satisfactory condition.  He tolerated the procedure well.  There were no  intraoperative complications.  He will be observed overnight and  discharged home in the morning after his Foley catheter is removed and  he has voided.      [name], M.D.  Electronically Signed     MCO/MEDQ  D:  06/18/2008  T:  06/18/2008  Job:  [id number]

## 2010-11-04 NOTE — Op Note (Signed)
[name], [name]              ACCOUNT NO.:  [id number]   MEDICAL RECORD NO.:  [id number]          PATIENT TYPE:  AMB   LOCATION:  [facility]                          FACILITY:  [facility]   PHYSICIAN:  [name]. [name], M.D.DATE OF BIRTH:  05-10-1930   DATE OF PROCEDURE:  01/12/2007  DATE OF DISCHARGE:                               OPERATIVE REPORT   PRE AND POSTOPERATIVE DIAGNOSIS AND INDICATIONS FOR SURGERY:  Right  parotid mass.   SURGICAL PROCEDURES:  Right parotidectomy with nerve integrity  monitoring (NIMS)   SURGEON:  [name]. [name], M.D.   ASSISTANT:  Dr. Brynda Peon.   ANESTHESIA:  General endotracheal.   COMPLICATIONS:  No complications.   BLOOD LOSS:  Approximately 100 mL.   The patient transferred from the operating room to recovery room in  stable condition.   BRIEF HISTORY:  [name] is a 77 year old white male who is referred  by Dr. Barbette Or, an oral surgeon in [place] for evaluation of a  right parotid mass.  The patient had an approximately 47-month history of  swelling in the right periparotid area with pain and discomfort along  the jaw line was referred to Dr. Raiford Noble for evaluation and biopsy was  undertaken in the office under local anesthesia and a pathologic  diagnosis showed normal appearing parotid tissue.  Patient was referred  to our office for evaluation and CT scan was obtained which showed a  large intraparotid mass involving the superior aspect of the parotid  gland.  It was adjacent to the zygoma and TMJ and was heterogeneous in  nature.  Given the patient's history and examination, I recommended  right parotidectomy.  Previous biopsy most likely missed the actual  pathologic aspect of the tumor and no further biopsy was undertaken at  the patient's request.  Given his history and examination, I recommended  that we undertake parotidectomy under general anesthesia.  Risks,  benefits and possible complications of procedure discussed  in detail,  particularly risks of recurrent tumor, infection and possible injury to  the facial nerve on the right-hand side.  The patient understood and  concurred with our plan for surgery which scheduled as above.   PROCEDURE:  The patient brought to the operating room on 01/12/2007,  placed in supine position on the operating table.  General endotracheal  anesthesia was established without difficulty.  The patient adequately  anesthetized he was positioned on the operating table, prepped and  draped in sterile fashion and injected with 5 mL of 1% lidocaine  1:100,000 solution epinephrine injected along the proposed incision site  in the preauricular sulcus.  The [facility] monitor was placed at the  orbicularis oris and orbicularis oculi muscles for intraoperative  monitoring and was used throughout the case.  With the patient prepped  and draped in position, surgical procedure begun by creating a  curvilinear incision in the anterior preauricular sulcus.  This was  carried around the earlobe and the immediate postauricular area and the  upper neck in a preexisting skin crease.  Incision was carried through  the skin underlying subcutaneous tissue and  a periparotid subcutaneous  flap was then elevated along the superficial aspect of the parotid  gland.  The overlying skin was densely adherent to the parotid gland and  dissection was carried out with both monopolar and bipolar  electrocautery and blunt and sharp dissection.  The anterior aspect of  the sternocleidomastoid muscle was the identified.  Dissection was  carried from superior to inferior, elevating the parotid gland  anteriorly.  The preauricular incision was followed from superficial to  deep along the cartilaginous tragus.  The main trunk of the facial nerve  was identified as it exited the stylomastoid foramen and dissection was  carried out through the substance of the parotid gland, identifying the  upper lower  divisions and then dissect each of the individual branches.  The patient had a very fibrous parotid gland with a moderate amount of  scar tissue and bleeding inferiorly and superiorly, the patient had a  large deep mass which was adherent to the zygoma and dissection was  carried out along the superior branch preserving the individual branches  using bipolar cautery and dissecting the mass from the parotid wound  bed. The parotid specimen was sent to pathology for gross and  microscopic evaluation.  The patient's wound was thoroughly irrigated,  several areas of hemorrhage were cauterized with bipolar cautery.  At  conclusion of procedure the NIMS monitor was used to stimulate the nerve  branches, the inferior division stimulated very well, superior division  had minimal response at 0.50 mA.  The patient's wound was again  irrigated and a 7-mm Blake drain was placed at the depth of the incision  through a separate stab incision and sutured in position with 3-0  Ethilon suture.  The patient's wound was then closed with 4-0 and 5-0  Vicryl sutures in interrupted fashion and superficial skin closure  achieved with 5-0 with 6-0 Ethilon suture in running locked fashion.  The patient's wound was cleaned and dressed with bacitracin ointment.  He was awakened from his anesthetic, extubated, was transferred to the  operating room to recovery in stable condition.  No complications.  Blood loss less than 100 mL.           ______________________________  [name]. [name], M.D.     DLS/MEDQ  D:  16/03/9603  T:  01/13/2007  Job:  [id number]

## 2010-11-04 NOTE — Op Note (Signed)
NAMESEMAJ, COBURN              ACCOUNT NO.:  [id number]   MEDICAL RECORD NO.:  [id number]          PATIENT TYPE:  AMB   LOCATION:  [facility]                         FACILITY:  [facility]   PHYSICIAN:  Mark C. [name], M.D.  DATE OF BIRTH:  December 04, 1929   DATE OF PROCEDURE:  11/02/2008  DATE OF DISCHARGE:                               OPERATIVE REPORT   PREOPERATIVE DIAGNOSES:  History of transitional cell carcinoma of the  bladder, as well as carcinoma in situ.   POSTOPERATIVE DIAGNOSES:  History of transitional cell carcinoma of the  bladder, as well as carcinoma in situ.   PROCEDURES:  Cystoscopy and bladder biopsy.   ANESTHESIA:  General.   BLOOD LOSS:  Minimal.   SPECIMENS:  Cold cup bladder biopsies from the right wall, left wall,  posterior wall and bladder base to pathology.   DRAINS:  None.   COMPLICATIONS:  None.   INDICATIONS:  The patient is a 77 year old male with a history of  transitional cell carcinoma of the bladder, as well as carcinoma in  situ.  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  He underwent 6 weeks of intravesical BCG, and now has returned for  repeat bladder biopsy.  The risks, complications, alternatives and  limitations have been discussed.  The patient understands and has  elected to proceed.   DESCRIPTION OF OPERATION:  After informed consent, the patient was  brought to the major OR and placed on the table and administered general  anesthesia.  He was then moved to the  dorsal lithotomy position, and  his genitalia was sterilely prepped and draped.  An official time-out  was then performed.  A 21-French rigid cystoscope with 12-degree lens  was then passed per urethra under direct vision, and the urethra was  noted to be normal.  The prostatic urethra was entered, and had  evidence  of previous resectional biopsy; but had no lesions.  I then entered the  bladder and found no papillary tumors present within the bladder.  Ureteral orifices were of normal configuration and position.  The  bladder wall did have some mild, diffuse erythema; although I did not  appreciate any focal areas of worrisome erythema, compared to the  remainder of the bladder mucosa.   Cold cup biopsies were obtained at the above-noted locations, and then  these were fulgurated with the Bugbee electrode.  I then reinspected the  bladder and noted no perforation or bleeding.  Therefore, I drained the  bladder and the cystoscope was removed.  The specimens were sent to  pathology, and the patient was awakened and taken to recovery room in  stable and satisfactory condition.  He tolerated the procedure well,  with no intraoperative complications.   He will be given a prescription for 200 mg Pyridium, and follow-up in my  office in one week to discuss the biopsy results.  I told him he could  restart his  Plavix and aspirin in 2 days.      Mark C. [name], M.D.  Electronically Signed     MCO/MEDQ  D:  11/02/2008  T:  11/02/2008  Job:  [id number]

## 2010-11-04 NOTE — Op Note (Signed)
[name], [name]              ACCOUNT NO.:  [id number]   MEDICAL RECORD NO.:  [id number]          PATIENT TYPE:  AMB   LOCATION:  [facility]                         [facility]ACILITY:  [facility]   PHYSICIAN:  [name], M.D.  DATE OF BIRTH:  April 19, 1930   DATE OF PROCEDURE:  05/14/2008  DATE OF DISCHARGE:                               OPERATIVE REPORT   PREOPERATIVE DIAGNOSES:  1. Benign prostatic hypertrophy with bladder outlet obstruction.  2. Bladder tumors.   POSTOPERATIVE DIAGNOSES:  1. Benign prostatic hypertrophy with bladder outlet obstruction.  2. Bladder tumors.   PROCEDURE:  1. Transurethral resection of bladder tumors (aggregate size      approximately 1.5 cm).  2. Transurethral incision of the prostate.  3. Bladder biopsy.   SURGEON:  [name], M.D.   ANESTHESIA:  General.   BLOOD LOSS:  Minimal.   DRAINS:  22-French Foley catheter.   SPECIMENS:  1. Cold cup biopsy of the posterior left wall of the bladder.  2. Prostate and anterior bladder neck as well as resectional biopsies      from the left wall of the bladder all to pathology.   COMPLICATIONS:  None.   INDICATIONS:  The patient is a 77 year old who had noted gross hematuria  and passage of clots for several weeks.  He was also having some  dysuria, had been on some Cipro, but the hematuria persisted.  He was  found by cystoscopy to have bladder tumors within the bladder mainly on  the left wall but also when I retroflexed the flexible scope I noted  multiple bladder tumors involving the area near the bladder neck at the  12 o'clock position that extended up the bladder wall anteriorly and  laterally to the 2 and 11 o'clock positions.  None of these could be  seen easily as the scope was passed into the bladder initially.  There  was a lot of nodular inflammatory appearing tissue also involving the  left wall of the bladder.  I discussed transurethral resection of the  lesions with the patient as well as  some form of resection of the  prostate at the time and have gone over the risks and complications.  He  understands and has elected to proceed.   DESCRIPTION OF OPERATION:  The patient has been off his Plavix for 7  days and received preoperative antibiotics.  He was taken to the major  operating room where he was placed on the table and administered general  anesthesia.  He was then moved to the dorsal lithotomy position and his  genitalia was sterilely prepped and draped and an official time-out was  then performed.   Initially I passed the 17-French flexible scope per urethra and noted  the urethra to be normal, the sphincter to be intact and the prostatic  urethra to be elongated with bilobar hypertrophy.  No intraprostatic  lesions were identified.  As I passed the scope into the bladder, I  again noted over on the left wall of the bladder what appeared to be  superficial papillary lesions consistent with transitional cell  carcinoma.  There were also some inflammatory changes of the left wall  of the bladder that included the area of the left ureteral orifice and  extended posteriorly to the posterior wall of the bladder to the left of  midline.  Initially I could not see the bladder tumors at the bladder  neck anteriorly and I retroflexed the scope and was again able to  identify these.  I therefore removed the flexible cystoscope and  inserted the resectoscope.   The resectoscope with Timberlake obturator was then inserted into the  bladder and the obturator removed.  The 12 degree lens with resectoscope  loop element were inserted into the bladder.  I first again reinspected  the bladder.  I noted the area on the posterior left wall appeared to be  papillary and therefore wanted to obtain a biopsy of this and did so by  removing the resectoscope element and inserting the cold cup biopsy  forceps.  I was able to obtain a cold cup biopsy from this location.  This was sent to  pathology.  I then reinserted the resectoscope element  and fulgurated that area.  I then resected the bladder tumors that were  identified on the left wall and posterior wall of the bladder and then  also fulgurated areas around these.  This was performed until no further  bladder tumor could be identified.  I then resected some of what  appeared to be inflammatory tissue.  The bladder tumor appeared to  involve the area of the left ureteral orifice and I resected through  this area with pure cut in order to prevent scarring.  I then turned my  attention to the bladder neck and resected at the 12 o'clock position  some prostate tissue and bladder neck tissue.  It was very difficult to  visualize the tumors and what I ended up having to do was nearly empty  the bladder and apply suprapubic pressure which allowed me to visualize  the bladder neck and anterior bladder at the 12 o'clock position.  I was  then able to resect away and fulgurate all visible bladder tumors.  Once  completing this, I turned my attention to his obstructing prostate.   Using the loop at the 12 o'clock position, I incised from the bladder  neck back to near the veru and resected away some of the lateral lobe  tissue as well.  The area was fulgurated and all prostate and bladder  tumor specimens were removed from the bladder with the Microvasive  evacuator.  I then reinspected the bladder and noted no further lesions  present within the bladder, no specimens in the bladder and no  perforation of the bladder or other abnormality.  The prostate had a  nice incision decreasing his obstruction.  I left the bladder full and  removed the resectoscope and inserted the 22-French Foley catheter.  This was connected to closed system drainage.  The patient was awakened  and taken to the recovery room in stable satisfactory condition.  He  tolerated the procedure well.  There were no intraoperative  complications.  He will be  given a prescription for 36 Pyridium 200 mg,  Cipro 500 mg b.i.d. #5 and Vicodin #28.  He will be observed in the  recovery room and as long as his urine remains clear enough he will be  discharged home.  Otherwise I will keep an eye on him overnight and let  go home in the morning.  [name], M.D.  Electronically Signed     MCO/MEDQ  D:  05/14/2008  T:  05/14/2008  Job:  [id number]

## 2010-11-07 NOTE — H&P (Signed)
. [facility]  Patient:    [name], [name] Visit Number: [id number] MRN: [id number]          Service Type: MED Location: [phone] Attending Physician:  Cristi Loron Dictated by:   Cristi Loron, M.D. Admit Date:  10/04/2001 Discharge Date: 10/07/2001   CC:         [name], M.D.  Genene Churn. Love, M.D.   History and Physical  CHIEF COMPLAINT:  Numbness.  HISTORY OF PRESENT ILLNESS:  The patient is a 77 year old white male who was in his usual state of health until yesterday when he noticed some numbness in his right arm after lifting up a lawnmower.  The patient ignored this and had a couple more episodes today in which he had some numbness, sometimes in his right hand and sometimes in his left hand.  In any event, he mentioned this to his daughter and was brought to the emergency department where he was evaluated by Dr. Bruce Donath including a cranial CT scan as the patient had a history of TIAs.  The cranial CT scan demonstrated a left subdural hematoma and a neurosurgical consultation was requested.  Presently, the patient denies nausea, vomiting, seizures, numbness, tingling, weakness.  He does admit to some mild headache.  He did have some difficulty with speaking today which was brief as well.  PAST MEDICAL HISTORY:  Positive for a cardiac arrhythmia.  He had a pacemaker placed and his cardiologist is Dr. Gerlene Burdock A. Alanda Amass.  The patient has a history of transient ischemic attack in 1996 and saw Dr. Genene Churn. Love and he has been placed on Coumadin since that.  He also has a history of thoracic surgery by Dr. Alanson Aly. 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  History of hypercholesterolemia.  PAST SURGICAL HISTORY:  Placement of pacemaker.  MEDICATIONS: 1. Toprol XL 50 mg p.o. q.d. 2. Lanoxin 0.125 mg p.o. q.d. 3. Altace 10 mg p.o. b.i.d. 4. Lipitor 20 mg p.o. q.d. 5. Coumadin 5 mg p.o.  Monday, Wednesday, Friday and 2.5 mg on the other days.  ALLERGIES:  CODEINE causes blisters.  FAMILY HISTORY:  The patients mother died at age 29 of unclear causes.  The patients father died at age 51 of lung cancer.  SOCIAL HISTORY:  The patient is a widow.  He has three children.  He lives in [place].  He is retired.  He denies tobacco and drug use.  Drinks three to five alcohol drinks per day.  REVIEW OF SYSTEMS:  Negative except as above.  PHYSICAL EXAMINATION:  GENERAL:  A pleasant, mildly obese 77 year old white male in no apparent distress.  VITAL SIGNS:  Temperature 97.4 orally, heart rate 82, respiratory rate 20, blood pressure 127/69.  Oxygen saturation 97% on room air.  HEENT:  Normocephalic and atraumatic.  Pupils are equal, round and reactive to light.  Extraocular movements are intact.  Oropharynx benign.  Uvula midline.  NECK:  Supple.  There are no masses, deformities, tracheal deviation, jugular venous distension.  He has a normal cervical range of motion.  Spurlings test is negative.  Lhermittes sign was not present.  MUSCULOSKELETAL:  Thorax is symmetric.  LUNGS:  Clear to auscultation.  HEART:  Irregular rhythm.  ABDOMEN:  Soft and nontender.  Obese.  EXTREMITIES:  No obvious deformities.  SKIN:  Back excision is well healed.  No signs of infection.  NEUROLOGICAL:  The patient is alert and oriented x  3.  Glasgow coma scale of 15.  Cranial nerves II-XII are grossly intact bilaterally.  Vision and hearing are grossly normal bilaterally except for some mild presbyopia and presbycusis.  Motor strength is 5/5 in his deltoid, biceps, triceps, hand grip, wrist extensor, interosseous psoas, quadriceps, gastrocnemius, extensor hallucis longus.  Deep tendon reflexes are trace in his bilateral biceps, triceps, quadriceps.  There is no ankle clonus.  Sensory exam is intact to light touch in all tested dermatomes bilaterally.  Cerebellar exam is intact with  rapid alternating movements to the upper extremities bilaterally.  LABORATORY DATA:  I have reviewed the patients cranial CT scan performed without contrast at [facility] today.  It demonstrates a small left frontal subdural hematoma with both acute and chronic blood with some mild mass-effect.  ASSESSMENT/PLAN: 1. Left subdural hematoma:  I have discussed the situation with the patient    and his two daughters of the small subdural hematoma.  This will    necessitate him being admitted to the hospital.  We will have to give him    vitamin K to reverse his Coumadin and I will repeat his cranial CT scan in    the morning to monitor the subdural hematoma.  If it gets bigger, he may    need surgery. 2. History of transient ischemic attack, cardiac arrhythmia, etc. noted:  I am    going to put him in the ACU. Dictated by:   Cristi Loron, M.D. Attending Physician:  Tressie Stalker D DD:  10/04/01 TD:  10/04/01 Job: [id number]

## 2010-11-07 NOTE — Cardiovascular Report (Signed)
NAME:  JAMESROBERT, OHANESIAN                        ACCOUNT NO.:  [id number]   MEDICAL RECORD NO.:  [id number]                   PATIENT TYPE:  OIB   LOCATION:  NA                                   FACILITY:  [facility]   PHYSICIAN:  [name], M.D.          DATE OF BIRTH:  1930/06/19   DATE OF PROCEDURE:  05/23/2002  DATE OF DISCHARGE:                              CARDIAC CATHETERIZATION   PROCEDURES:  Explantation of end-of-life (EOL) Intermedics Number 294-03  pulse generator (implanted 08/19/1994), Serial Number [id number].  Implantation of  new VVIR Guidant Insignia One Plus SR, Model Number [id number]; Serial Number  [id number].  Using existing ventricular electrode, Intermedics Number 430-02;  Serial Number [id number] (Implant 09/26/1988).  Old atrial electrode Pacesetter  Number 1142T; Serial Number [id number] capped.   IMPLANTING PHYSICIAN:  [name], M.D.   COMPLICATIONS:  None.   ESTIMATED BLOOD LOSS:  Approximately 20 cc.   ANESTHESIA:  5 mg Valium p.o. premedication, 1% local Xylocaine, 4 mg Nubain  IV in divided doses.   Hp   PREOPERATIVE AND POSTOPERATIVE DIAGNOSES:  1. End-of-life pulse generator, Intermedics Relay DDDR implanted 08/19/1994.  2. Initial Pacemaker implant 09/26/1988, Cosmos II Intermedics for symptomatic     bradycardia with recurrent presyncope.  3. Intermittent CHB.  4. Remote normal coronary arteries in 1990.  5. Negative Cardiolite for ischemia in 1999.  6. History of transient ischemic attack x 2 in 1997 and 03/1998.  7. Spontaneous subdural hematoma on Coumadin 09/2001, treated conservatively     with resolution.  On chronic Plavix and aspirin.  8. Paroxysmal atrial fibrillation past direct current cardioversion 10/2000,     could not hold on Betapace therapy or amiodarone.  Amiodarone     discontinued as outpatient after episode of pneumonia.  9. Permanent atrial fibrillation.  10.      Normal left ventricular function.  11.      Recent blood in  stool with endoscopy by Dr. Randa Evens with hiatal     hernia, reflux, and small mucosal tear.  Colonoscopy and diverticulosis,     no recurrence.  12.      Defective atrial lead extraction with new atrial lead implantation     08/19/1994.   DESCRIPTION OF PROCEDURE:  The patient was brought to the second floor CP  lab in the postabsorptive state after 5 mg Valium p.o. medication and 1 g of  Ancef IV prophylactic antibiotic.  He was given 4 mg of Nubain during the  procedure in divided doses for sedation.  The left anterior chest was  prepped, draped in the usual manner, and 1% Xylocaine was used for local  anesthesia.  A transverse incision was performed above the level of the  previously placed incision.  Fluoroscopy showed good position of the  electrodes, no insulation defects, and the electrodes looped behind the end-  of-life generator.  Blunt dissection was used with careful electrocautery to  control hemostasis.  The fibrous capsule was opened, and the generator freed  up using blunt dissection.  The generator was freed up and delivered from  the pocket.  The ventricular electrodes were removed from the generator  after opening the slide lock on the header.  The atrial electrode was  identified as a Careers information officer [id number], Serial Number [id number].  This was  capped with a silicone cap and two #1 silk sutures.  It was then positioned  in the posterior aspect of the pocket.  Ventricular electrode was identified  and was an Intermedics Number 430-02 VS1, Serial Number [id number].  It  appeared intact with no insulation defects on inspection.  No blood on the  lead. Threshold testing was then performed bipolar and unipolar.   Bipolar threshold for capture 1.1 volts, impedance 704 ohms, R wave 26.7 mV.   Unipolar ventricular: Threshold 0.8 volts, resistance 608 ohms, R wave 25.6  mV.  R waves were biphasic on PSA testing.   There was no diaphragmatic stimulation with 10 volt output testing  through  the PSA.  The electrode was inserted into the generator header.  The two hex  nuts were tightened.  The generator was delivered into the pocket with the  electrode behind.   The subcutaneous tissue was closed with two separate running layers of Dexon  suture after irrigating the pocket with 50 cc of kanamycin solution.  The  pocket was dry.  Sponge count was correct.  The skin was closed with 5-0  subcuticular suture, and Steri-Strips were applied.  Fluoroscopy showed good  position of the electrodes and full insertion into the header.  The patient  was VVI pacing at this time and was transferred to the holding area for  pacer reprogramming post implant.  He tolerated the procedure well.   Magnet rate BOL equals 100, at ERI, magnet rate drops to 85 per minute.                                                [name], M.D.    RAW/MEDQ  D:  05/23/2002  T:  05/23/2002  Job:  [id number]   cc:   CP Lab   Genene Churn. Love, M.D.  [address]  [address]  [place]  [place] [address]  Fax: [phone]   Juluis Mire, M.D.  [address]  [place]  [place] [address]  Fax: [phone]   Stacie Glaze, M.D. [facility]   Mark C. [name], M.D.  [address]  [place]  [place] [address]  Fax: [phone]

## 2010-11-07 NOTE — Consult Note (Signed)
[place]. [facility]  Patient:    [name], [name] Visit Number: [id number] MRN: [id number]          Service Type: MED Location: [phone] Attending Physician:  [name] D Dictated by:   [name], M.D. Proc. Date: 10/06/01 Admit Date:  10/04/2001 Discharge Date: 10/07/2001   CC:         [name], M.D.  [name], M.D.   Consultation Report  DATE OF BIRTH:  March 16, 1930  CHIEF COMPLAINT:  TIAs, right and left arm.  I was asked by Dr. Delma [name] to see the patient. This is a 77 year old right-handed Caucasian gentleman who had 2 episodes of TIA-like behavior on Monday and Tuesday of this week. It first occurred when he was pulling a lawnmower into a shed. He experienced paresthesias involving his right arm, and the right arm felt heavy. The symptoms cleared within about 5 minutes or so.  The next day he was engaged in minimal physical activity and went to pick up something with his left hand and was unable to do so because there was a left wrist drop. The entire arm felt heavy. Symptoms again persisted for under 15 minutes. He was brought to the [facility] Emergency Room where he was evaluated by Dr. Bruce [name] and admitted by Dr. Tressie [name] for further evaluation and treatment.  The patient had a CT scan of the brain which showed evidence of a left frontal parietal subdural hematoma that had both chronic and acute characteristics and was no greater than 2 cm in thickness. There was minimal mass effect largely because the patient has moderate diffuse cortical atrophy and was able to take up some of the slack of the subdural which otherwise would not have been the case.  The patient has chronic atrial fibrillation and had a TIA event several years ago. At that time, the decision was made to place him on Coumadin. He was seen by my partner, Dr. Avie [name]. The patient has also had problems with  lumbar and cervical spondylosis. He was seen by Dr. Corlis [name] for the lumbar spondylosis and advised to not undergo a procedure because of his cardiovascular status. He was last seen in our office with complaints of his left arm which turned out to be a radiculopathy or pseudoradiculopathy which was evaluated by EMG nerve conduction. Plans again remain to manage this conservatively. The patient has been "living with these symptoms" and has been able to avoid significant discomfort largely by avoiding lifting heavy objects and riding in vehicles that would cause significant buffeting of his body.  We were unaware of any trauma that produced the patients subdural lesions on the left. The patient has had no known seizure activity. We were unaware of any prior stroke although most recent CT scan shows what appears to be a remote stroke in the left inferior frontal region which was not seen on the October 04, 2001, scan. Whether this was a matter of volume averaging, the angle at which the scan was taken is unclear to me. It is clear, however, that the lesion is a remote one rather than a subacute based on CT criteria.  PAST MEDICAL HISTORY:  Remarkable for hypertension, compensated congestive heart failure, dyslipidemia, and moderate daily alcohol use (3-4 shots of bourbon per day, sometimes more on weekends).  REVIEW OF SYSTEMS:  Remarkable for headache. Review of systems is otherwise unremarkable for intercurrent infection of the head, neck,  lungs, GI, GU, for fever, rash, anemia, bruisability, diabetes, or thyroid disease. No new neurological or psychiatric problems. The patient has not had problems with nausea, vomiting, or diarrhea, with other bleeding dyscrasias. He has had problems with spondylosis as noted above and with his heart which is also noted above.  PAST SURGICAL HISTORY:  Implanted pacemaker that is about 75 years old and has been replaced at least once. 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 I am unaware of any other surgical procedures that the patient has had.  CURRENT MEDICATIONS: 1. Toprol XL 50 mg per day. 2. Lanoxin 0.125 mg per day. 3. Altace 10 mg q.12h. 4. Zocor 40 mg at 6 p.m. 5. Thiamine 100 mg q.d. 6. Multivitamin 1 per day.  P.R.N. MEDICATIONS: 1. Tylenol. 2. Laxative of choice. 3. Ativan. 4. Hydrocodone.  ALLERGIES TO MEDICINES:  ________ causes blisters.  FAMILY HISTORY:  The patients mother died at age 53 of unknown causes. Father died at age 51 of lung cancer.  SOCIAL HISTORY:  The patient is widowed. He has 3 children. He does not use tobacco. His alcohol use is noted above. He does not use drugs. He has a girlfriend. He is a retired Warehouse manager from __________.  PHYSICAL EXAMINATION:  GENERAL:  This is a pleasant, well-developed, obese right-handed gentleman in no acute distress.  VITAL SIGNS:  Temperature 96.9, blood pressure 126/66, resting pulse 76, respirations 16, pulse oximetry 99%.  ENT:  No signs of infection.  NECK:  Supple. Full range of motion. No cranial or cervical bruits.  LUNGS:  Clear to auscultation.  HEART:  No murmurs. Pulse is normal.  ABDOMEN:  Soft, nontender. Bowel sounds are normal. No hepatosplenomegaly.  EXTREMITIES:  Normal.  NEUROLOGIC:  Examination of mental status:  Awake, alert, attentive, appropriate, no dysphasia or dystaxia. Cranial nerve examination:  Round, reactive pupils. Fundi were normal. Visual fields full to double simultaneous stimuli. Okay and responses equal. Symmetric facial strength. Midline tongue and uvula. Air conduction greater than bone conduction bilaterally. Motor examination:  Normal strength and tone. Good fine motor movements. No pronator drift. Sensory examination intact. Cold vibrations stereoagnosis and  proprioception. Cerebellar:  Good finger-to-nose, rapid repetitive movements. Gait:  Slightly  broadbased. The patient cannot tandem without losing his balance. He has shaky Romberg. He needs support to get up on his heels and toes but then has the strength to walk on heels and toes. Deep tendon reflexes are diminished and equal. The patient had bilateral flexor plantar responses.  IMPRESSION: 1. TIA events, right and left arm. Differential diagnosis would    include cardioembolic or aortic artery-to-artery embolism.    It is certainly possible that the patient could have significant    atherosclerotic lesions of both carotids or intracranial carotids or    distal intracranial vessel vascular disease. 2. The possibility of __________ seizures needs to be considered, at    least for the right-sided tingling events. I do not believe that a    seizure would cause the left wrist drop. Seizures _________ lesion    from the subdural would not be at all unusual. 4. The patient had a remote anterior/inferior cerebrovascular accident    that was seen on the October 05, 2001, study but not on the October 04 2001,    study. The characteristics of this make this appear to be old, and I    wonder if it did not occur at the time of the patients first  set    of "TIAs" several years ago. 5. The patient has chronic atrial fibrillation and is on a pacemaker.    Taking him off of Coumadin places him at great risk for recurrent    stroke. 6. Left frontal parietal subdural hematoma, both acute and chronic with    no precipitating factor.  PLAN: 1. Electroencephalogram. 2. Increase the activity to prevent deep venous thrombosis. 3. Carotid Doppler studies. 4. 2-D echocardiogram. ______ to read. 5. Serum fasting lipid panel, serum fasting homocysteine to look    for treatable causes of stroke. 6. Start aspirin. I would like to add Plavix as well.  COMMENT:  The patient is at risk for further strokes off of Coumadin, but he cannot remain on it since he is subdural. We cannot perform an MRI because  of his pacemaker. I see no reason at this time to recheck a CT scan unless he has clinical progression. I agree with conservative management of his subdural. I would not place him on any epileptic drugs at this time. If you have any questions about this or I can be of assistance, please do not hesitate to contact me. Dictated by:   [name], M.D. Attending Physician:  [name] D DD:  10/06/01 TD:  10/07/01 Job: [id number]

## 2010-11-07 NOTE — Discharge Summary (Signed)
[place]. [facility]  Patient:    [name], [name] Visit Number: [id number] MRN: [id number]          Service Type: MED Location: [phone] Attending Physician:  Cristi Loron Dictated by:   Mena Goes. Franky Macho, M.D. Admit Date:  10/04/2001 Discharge Date: 10/07/2001                             Discharge Summary  ADMITTING DIAGNOSIS:  Numbness and subdural hematoma.  INDICATIONS:  The patient is a 77 year old gentleman who presented to the hospital when he noticed numbness in his right arm after lifting a lawn mower. He came to the hospital and had a cranial CT scan which showed a left subdural hematoma which was small and did not need surgical decompression.  He had been taking Coumadin secondary to a cardiac arrhythmia.  He has a pacemaker and an active cardiologist.  He also has had a history of transvenous ischemic attacks.  He has been on Coumadin since 1996.  Secondary to the subdural hematoma he was given vitamin K and taken off the Coumadin.  He has had a full work-up while here in the hospital which has included Doppler studies of the carotid arteries which were negative.  He also had antegrade flow in the vertebral artery.  He also had a 2-D echocardiogram done which did not show any abnormalities.  He is being sent home with aspirin 325 mg today and plavix along with his admitting medications.  PHYSICAL EXAMINATION:  GENERAL:  Normal strength, no drift.  He has normal language abilities and in essence a normal neurologic examination.  He will be discharged home for repeat follow up study with Dr. Lovell Sheehan to assess his subdural.  Dictated by: Mena Goes. Franky Macho, M.D. Attending Physician:  Tressie Stalker D DD:  10/07/01 TD:  10/08/01 Job: [id number]

## 2010-11-07 NOTE — Procedures (Signed)
[facility]  Patient:    [name], [name]                     MRN: [id number] Proc. Date: 05/04/00 Adm. Date:  [id number] Attending:  Orland Mustard CC:         Juluis Mire, M.D.  [name], M.D.   Procedure Report  PROCEDURE:  Colonoscopy.  MEDICATIONS:  Fentanyl 100 mcg, Versed 7 mg IV.  SCOPE:  Olympus adult video colonoscope.  INDICATIONS FOR PROCEDURE:  A nice 77 year old gentleman with a previous history of adenomatous colon polyps.  DESCRIPTION OF PROCEDURE:  The procedure had been explained to the patient and consent obtained. With the patient in the left lateral decubitus position, the adult Olympus video colonoscope was inserted following the digital exam and advanced under direct vision. The prep was excellent. The scope was advanced. He did have diverticulosis and took some time to get to the sigmoid after that using abdominal pressure and position changes, we were able to advance to the ascending colon and finally down to the tip of the cecum and the appendiceal orifice and the ileocecal valve were seen. The scope was withdrawn and the cecum, ascending colon, hepatic flexure, transverse colon, splenic flexure, descending and sigmoid colon were seen well upon removal. No polyps or other lesions were seen other than diverticular disease of the sigmoid colon which had been noted on previous exams. The scope continued to be withdrawn. No other findings were seen and the rectum was free of lesions and small hemorrhoids. The patient tolerated the procedure well and was maintained on low flow oxygen and pulse oximeter throughout the procedure.  ASSESSMENT: 1. No evidence of further polyps. 2. Diverticulosis.  PLAN:  Will recommend repeating procedure in 3 years. He will resume his Coumadin today. DD:  05/04/00 TD:  05/04/00 Job: [id number]

## 2010-12-12 ENCOUNTER — Other Ambulatory Visit: Payer: Self-pay | Admitting: Urology

## 2010-12-12 ENCOUNTER — Ambulatory Visit (HOSPITAL_BASED_OUTPATIENT_CLINIC_OR_DEPARTMENT_OTHER)
Admission: RE | Admit: 2010-12-12 | Discharge: 2010-12-12 | Disposition: A | Discharge status: Home / Self Care | Payer: Medicare Other | Source: Ambulatory Visit | Attending: Urology | Admitting: Urology

## 2010-12-12 DIAGNOSIS — Z01812 Encounter for preprocedural laboratory examination: Secondary | ICD-10-CM | POA: Insufficient documentation

## 2010-12-12 DIAGNOSIS — Z8673 Personal history of transient ischemic attack (TIA), and cerebral infarction without residual deficits: Secondary | ICD-10-CM | POA: Insufficient documentation

## 2010-12-12 DIAGNOSIS — Z8551 Personal history of malignant neoplasm of bladder: Secondary | ICD-10-CM | POA: Insufficient documentation

## 2010-12-12 DIAGNOSIS — R82998 Other abnormal findings in urine: Secondary | ICD-10-CM | POA: Insufficient documentation

## 2010-12-12 DIAGNOSIS — I447 Left bundle-branch block, unspecified: Secondary | ICD-10-CM | POA: Insufficient documentation

## 2010-12-12 DIAGNOSIS — I10 Essential (primary) hypertension: Secondary | ICD-10-CM | POA: Insufficient documentation

## 2010-12-12 DIAGNOSIS — Z950 Presence of cardiac pacemaker: Secondary | ICD-10-CM | POA: Insufficient documentation

## 2010-12-12 DIAGNOSIS — I517 Cardiomegaly: Secondary | ICD-10-CM | POA: Insufficient documentation

## 2010-12-12 LAB — POCT I-STAT 4, (NA,K, GLUC, HGB,HCT)
Glucose, Bld: 83 mg/dL (ref 70–99)
HCT: 43 % (ref 39.0–52.0)
Hemoglobin: 14.6 g/dL (ref 13.0–17.0)
Potassium: 4.1 mEq/L (ref 3.5–5.1)
Sodium: 143 mEq/L (ref 135–145)

## 2010-12-17 NOTE — Op Note (Signed)
NAMEJAKEN, FREGIA              ACCOUNT NO.:  [id number]  MEDICAL RECORD NO.:  [id number]  LOCATION:                                 FACILITY:  PHYSICIAN:  Malakye Nolden C. [name], M.D.  DATE OF BIRTH:  [birth year]  DATE OF PROCEDURE:  12/12/2010 DATE OF DISCHARGE:                              OPERATIVE REPORT   PREOPERATIVE DIAGNOSIS:  History of transitional cell carcinoma of the bladder, now with positive cytology.  POSTOPERATIVE DIAGNOSIS:  History of transitional cell carcinoma of the bladder, now with positive cytology.  PROCEDURE: 1. Cystoscopy. 2. Barbotage of bladder. 3. Bladder biopsies. 4. Bilateral retrograde pyelograms with interpretation. 5. Bilateral ureteral catheterization with bilateral renal pelvis     barbotage for cytology.  SURGEON:  Florencia Zaccaro C. [name], M.D.  ANESTHESIA:  General.  SPECIMENS: 1. Bladder biopsies to pathology. 2. Barbotage urine from the bladder for cytology. 3. Barbotage specimen from right and left renal pelvis for cytology.  BLOOD LOSS:  Less than 5 mL.  COMPLICATIONS:  None.  INDICATIONS:  The patient is a 77 year old male with a history of transitional cell carcinoma with associated CIS, initially diagnosed in December 2009.  He was treated with BCG after CIS was identified within the bladder and this was completed in April 10.  Repeat cystoscopy with random bladder biopsies in May 10 was found to be negative for CIS or other abnormality and he has been followed with surveillance cystoscopy. I found no abnormality cystoscopically recently but urine was sent for cytology and the cytology returned positive.  I contacted the patient and discussed the full and thorough evaluation outlined below.  He understands and has elected to proceed with that.  DESCRIPTION OF OPERATION:  After informed consent, the patient was brought to the major OR, placed on table and administered general anesthesia, then moved to the dorsal lithotomy  position.  The genitalia was sterilely prepped and draped.  An official time-out was then performed.  The 22-French cystoscope was then passed under direct vision down the urethra which was noted to be normal.  The prostatic urethra was resected.  There was no obvious tumors or worrisome lesions within the prostatic urethra.  The bladder neck was widely patent.  The trigone appeared normal and ureteral orifices were of normal configuration and position.  The bladder was fully and systematically inspected and appeared to be absolutely normal, except for one area on the posterior wall that appeared slightly erythematous but this could possibly have been from cystoscope trauma, as I did obtain a barbotage specimen from the bladder prior to full inspection of the bladder.  The barbotage specimen from the bladder was sent for cytology.  The cold cup biopsy forceps were then used to obtain biopsies of the posterior wall of the bladder as well as from the prostatic urethra. This was sent to pathology.  I then used the Bugbee electrode to fulgurate these locations.  The slightly erythematous area in the posterior wall of the bladder also was completely fulgurated with the Bugbee.  I then performed retrograde pyelography.  First on right and then left sides.  This was performed by passing a 6-French open-ended catheter through the cystoscope and into  the right orifice initially and injecting full strength contrast under direct fluoroscopy.  I note the ureter is entirely normal throughout its course with no filling defects or mass effect and the intrarenal collecting system was completely normal.  This was photographed.  Identical procedure was then performed on the left-hand side with no abnormalities being noted there either.  I let of the contrast wash out of the collecting system and then catheterize the right ureter with a 6-French open-ended catheter and passed this all the way to the  level of the renal pelvis.  I then used sterile saline to barbotage the renal pelvis and this was sent to pathology.  I then removed the open-ended catheter, flushed it with saline and then passed it up the left ureter and barbotage the left renal pelvis in an identical fashion and sent this for cytology as well. The open-ended stent was then removed and the bladder was drained and the patient was awakened and taken to recovery room in stable and satisfactory condition.  He tolerated the procedure well with no intraoperative complications.     Nandita Mathenia C. [name], M.D.     MCO/MEDQ  D:  12/12/2010  T:  12/12/2010  Job:  [id number]  Electronically Signed by Ihor Gully M.D. on 12/17/2010 05:35:22 PM

## 2010-12-22 ENCOUNTER — Other Ambulatory Visit (HOSPITAL_COMMUNITY): Payer: Self-pay | Admitting: Oncology

## 2010-12-22 ENCOUNTER — Encounter (HOSPITAL_BASED_OUTPATIENT_CLINIC_OR_DEPARTMENT_OTHER): Payer: Medicare Other | Admitting: Oncology

## 2010-12-22 DIAGNOSIS — C07 Malignant neoplasm of parotid gland: Secondary | ICD-10-CM

## 2010-12-22 DIAGNOSIS — D649 Anemia, unspecified: Secondary | ICD-10-CM

## 2010-12-22 DIAGNOSIS — D090 Carcinoma in situ of bladder: Secondary | ICD-10-CM

## 2010-12-22 LAB — CBC WITH DIFFERENTIAL/PLATELET
Basophils Absolute: 0 10*3/uL (ref 0.0–0.1)
EOS%: 2.7 % (ref 0.0–7.0)
HCT: 38.6 % (ref 38.4–49.9)
HGB: 13.3 g/dL (ref 13.0–17.1)
LYMPH%: 25.7 % (ref 14.0–49.0)
MCH: 36.1 pg — ABNORMAL HIGH (ref 27.2–33.4)
MCV: 104.8 fL — ABNORMAL HIGH (ref 79.3–98.0)
MONO%: 7 % (ref 0.0–14.0)
NEUT%: 64.4 % (ref 39.0–75.0)

## 2010-12-22 LAB — COMPREHENSIVE METABOLIC PANEL
AST: 23 U/L (ref 0–37)
Alkaline Phosphatase: 90 U/L (ref 39–117)
BUN: 17 mg/dL (ref 6–23)
Calcium: 9.4 mg/dL (ref 8.4–10.5)
Creatinine, Ser: 0.93 mg/dL (ref 0.50–1.35)

## 2010-12-25 ENCOUNTER — Ambulatory Visit
Admission: RE | Admit: 2010-12-25 | Discharge: 2010-12-25 | Disposition: A | Discharge status: Home / Self Care | Payer: Medicare Other | Source: Ambulatory Visit | Attending: Radiation Oncology | Admitting: Radiation Oncology

## 2011-01-21 HISTORY — PX: CYSTOSCOPY: SUR368

## 2011-02-16 ENCOUNTER — Encounter (HOSPITAL_BASED_OUTPATIENT_CLINIC_OR_DEPARTMENT_OTHER): Payer: Medicare Other | Admitting: Oncology

## 2011-02-16 DIAGNOSIS — C07 Malignant neoplasm of parotid gland: Secondary | ICD-10-CM

## 2011-02-16 DIAGNOSIS — Z452 Encounter for adjustment and management of vascular access device: Secondary | ICD-10-CM

## 2011-03-24 LAB — POCT I-STAT, CHEM 8
Creatinine, Ser: 1.2
HCT: 33 — ABNORMAL LOW
Hemoglobin: 11.2 — ABNORMAL LOW
Potassium: 3.8
Sodium: 132 — ABNORMAL LOW
TCO2: 27

## 2011-03-24 LAB — POCT I-STAT 4, (NA,K, GLUC, HGB,HCT)
Glucose, Bld: 92
HCT: 39
Hemoglobin: 13.3
Potassium: 4
Sodium: 139

## 2011-03-24 LAB — URINE CULTURE
Colony Count: NO GROWTH
Culture: NO GROWTH

## 2011-03-24 LAB — DIFFERENTIAL
Basophils Absolute: 0
Basophils Relative: 0
Eosinophils Absolute: 0
Monocytes Relative: 7
Neutro Abs: 7.1
Neutrophils Relative %: 82 — ABNORMAL HIGH

## 2011-03-24 LAB — GLUCOSE, CAPILLARY
Glucose-Capillary: 124 — ABNORMAL HIGH
Glucose-Capillary: 145 — ABNORMAL HIGH
Glucose-Capillary: 170 — ABNORMAL HIGH
Glucose-Capillary: 192 — ABNORMAL HIGH

## 2011-03-24 LAB — URINALYSIS, ROUTINE W REFLEX MICROSCOPIC
Glucose, UA: NEGATIVE
Ketones, ur: 15 — AB
Nitrite: POSITIVE — AB
Protein, ur: 100 — AB
Urobilinogen, UA: 1

## 2011-03-24 LAB — CBC
MCHC: 33.3
MCV: 107.5 — ABNORMAL HIGH
Platelets: 161
RBC: 2.98 — ABNORMAL LOW

## 2011-03-24 LAB — URINE MICROSCOPIC-ADD ON

## 2011-03-24 LAB — PATHOLOGIST SMEAR REVIEW

## 2011-03-27 LAB — GLUCOSE, CAPILLARY
Glucose-Capillary: 104 mg/dL — ABNORMAL HIGH (ref 70–99)
Glucose-Capillary: 107 mg/dL — ABNORMAL HIGH (ref 70–99)
Glucose-Capillary: 88 mg/dL (ref 70–99)

## 2011-03-27 LAB — POCT I-STAT 4, (NA,K, GLUC, HGB,HCT)
Glucose, Bld: 105 mg/dL — ABNORMAL HIGH (ref 70–99)
HCT: 41 % (ref 39.0–52.0)
Hemoglobin: 13.9 g/dL (ref 13.0–17.0)
Potassium: 3.8 mEq/L (ref 3.5–5.1)
Sodium: 140 mEq/L (ref 135–145)

## 2011-04-06 LAB — HEPATIC FUNCTION PANEL
ALT: 16
Albumin: 3.7
Alkaline Phosphatase: 149 — ABNORMAL HIGH
Total Bilirubin: 1.2
Total Protein: 7.5

## 2011-04-06 LAB — BASIC METABOLIC PANEL
CO2: 29
Calcium: 9.3
Chloride: 103
Creatinine, Ser: 0.7
GFR calc Af Amer: >60
Sodium: 138

## 2011-04-06 LAB — CBC
Hemoglobin: 13.4
RBC: 3.98 — ABNORMAL LOW
WBC: 6.4

## 2011-04-30 ENCOUNTER — Other Ambulatory Visit: Payer: Self-pay | Admitting: Medical Oncology

## 2011-04-30 DIAGNOSIS — C07 Malignant neoplasm of parotid gland: Secondary | ICD-10-CM

## 2011-05-01 ENCOUNTER — Telehealth: Payer: Self-pay | Admitting: Oncology

## 2011-05-01 NOTE — Telephone Encounter (Signed)
gv pt appt schedule for 11/27 @ 4 pm.

## 2011-05-15 ENCOUNTER — Encounter: Payer: Self-pay | Admitting: Medical Oncology

## 2011-05-19 ENCOUNTER — Other Ambulatory Visit (HOSPITAL_BASED_OUTPATIENT_CLINIC_OR_DEPARTMENT_OTHER): Payer: Medicare Other

## 2011-05-19 ENCOUNTER — Encounter: Payer: Self-pay | Admitting: Oncology

## 2011-05-19 ENCOUNTER — Ambulatory Visit (HOSPITAL_BASED_OUTPATIENT_CLINIC_OR_DEPARTMENT_OTHER): Payer: Medicare Other | Admitting: Oncology

## 2011-05-19 VITALS — BP 188/86 | HR 72 | Temp 97.9°F | Ht 68.5 in | Wt 190.1 lb

## 2011-05-19 DIAGNOSIS — D0590 Unspecified type of carcinoma in situ of unspecified breast: Secondary | ICD-10-CM

## 2011-05-19 DIAGNOSIS — C07 Malignant neoplasm of parotid gland: Secondary | ICD-10-CM

## 2011-05-19 DIAGNOSIS — C677 Malignant neoplasm of urachus: Secondary | ICD-10-CM

## 2011-05-19 DIAGNOSIS — Z950 Presence of cardiac pacemaker: Secondary | ICD-10-CM

## 2011-05-19 DIAGNOSIS — E785 Hyperlipidemia, unspecified: Secondary | ICD-10-CM | POA: Insufficient documentation

## 2011-05-19 DIAGNOSIS — R972 Elevated prostate specific antigen [PSA]: Secondary | ICD-10-CM

## 2011-05-19 DIAGNOSIS — E119 Type 2 diabetes mellitus without complications: Secondary | ICD-10-CM | POA: Insufficient documentation

## 2011-05-19 DIAGNOSIS — C679 Malignant neoplasm of bladder, unspecified: Secondary | ICD-10-CM

## 2011-05-19 DIAGNOSIS — I10 Essential (primary) hypertension: Secondary | ICD-10-CM | POA: Insufficient documentation

## 2011-05-19 LAB — COMPREHENSIVE METABOLIC PANEL
AST: 52 U/L — ABNORMAL HIGH (ref 0–37)
Albumin: 3.6 g/dL (ref 3.5–5.2)
Alkaline Phosphatase: 91 U/L (ref 39–117)
BUN: 9 mg/dL (ref 6–23)
Glucose, Bld: 96 mg/dL (ref 70–99)
Potassium: 3.3 mEq/L — ABNORMAL LOW (ref 3.5–5.3)
Sodium: 137 mEq/L (ref 135–145)
Total Bilirubin: 0.8 mg/dL (ref 0.3–1.2)
Total Protein: 7 g/dL (ref 6.0–8.3)

## 2011-05-19 LAB — CBC WITH DIFFERENTIAL/PLATELET
EOS%: 1.4 % (ref 0.0–7.0)
LYMPH%: 28.2 % (ref 14.0–49.0)
MCH: 34.8 pg — ABNORMAL HIGH (ref 27.2–33.4)
MCV: 98.8 fL — ABNORMAL HIGH (ref 79.3–98.0)
MONO%: 9.9 % (ref 0.0–14.0)
Platelets: 180 10*3/uL (ref 140–400)
RBC: 4.17 10*6/uL — ABNORMAL LOW (ref 4.20–5.82)
RDW: 14.3 % (ref 11.0–14.6)

## 2011-05-19 MED ORDER — HEPARIN SOD (PORK) LOCK FLUSH 100 UNIT/ML IV SOLN
500.0000 [IU] | Freq: Once | INTRAVENOUS | Status: AC
  Administered 2011-05-19: 500 [IU] via INTRAVENOUS
  Filled 2011-05-19: qty 5

## 2011-05-19 MED ORDER — SODIUM CHLORIDE 0.9 % IJ SOLN
10.0000 mL | Freq: Once | INTRAMUSCULAR | Status: AC
  Administered 2011-05-19: 10 mL via INTRAVENOUS
  Filled 2011-05-19: qty 10

## 2011-05-19 NOTE — Progress Notes (Signed)
This office note has been dictated.  #[id number]

## 2011-05-19 NOTE — Progress Notes (Signed)
CC:   [name]. [name], M.D. [name], M.D. [name], M.D. [name]. [name], M.D. [name], M.D.  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  Details of the patient's treatment will be outlined below and have been outlined in previous notes.  In addition, the patient also is under treatment at the present time for bladder cancer. His original diagnosis of an invasive urothelial high-grade cancer of the bladder was in November of 2009.  The patient was treated with intravesical BCG by Dr. Ihor [name] during the spring of 2010.  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  I believe his last cystoscopy was carried out on 02/27/2011 and was negative.  Dr. Vernie [name] also follows [name] for an elevated PSA.  [name] has maintained his Port-A-Cath with heparin flushes every 2 months.  He has multiple medical problems, but so far he seems to be getting along fairly well. He is now 77 years old.  He is without any complaints today. Specifically no symptoms to suggest recurrence of any of his cancers. He denies any pain, shortness of breath, blood in his urine.  His worst problem is degenerative disease in his knees.  He has chronic edema of his legs.  PROBLEM LIST:  Reads as follows: 1. Primary mucoepidermoid carcinoma of the parotid gland, high-grade     T2 Nx status post right parotidectomy with nerve integrity     monitoring on 01/12/2007 by Dr. Osborn [name].  Margin was     positive.  There was perineural invasion.  The patient received     initial chemotherapy.  He initially received carboplatin, Taxotere     and Neulasta on 02/18/2007.  He developed a rash which we felt was     due to Taxotere.  Thereafter he was treated with carboplatin, 5-FU     and  Neulasta 2 cycles in mid September and in mid October.     Thereafter he received weekly carboplatin 6 treatments in     conjunction with radiation treatments to the right parotid gland     area from 04/28/2007 through 06/08/2007.  The patient has remained     in complete remission since that time.  As stated, we have     maintained his Port-A-Cath. 2. Invasive high-grade urothelial bladder cancer diagnosed November     2009 treated with intravesical BCG in the spring of 2010,     subsequently diagnosed with carcinoma in situ on 12/12/2010 by Dr.     Ihor [name] treated with 6 weekly treatments of intravesical BCG,     with most recent cystoscopy on 02/27/2011. 3. Elevated PSA. 4. Transvenous pacemaker in the left chest. 5. Diabetes mellitus. 6. Hypertension. 7. Dyslipidemia. 8. History of atrial fibrillation. 9. BPH. 10.Degenerative arthritis of the knees. 11.History of TIAs. 12.Palpable left axillary masses.  MEDICATIONS:  Reviewed and recorded.  The patient is on aspirin, Lipitor, Plavix, Lanoxin, multivitamins, Protonix, Altace and Ambien.  PHYSICAL EXAMINATION:  General:  [name] shows little change.  He is somewhat frail in appearance, but he is 77 years old, holding up fairly well.  Vital signs:  His weight today is 190 pounds, down 10 pounds since the spring of this year.  Height 5 feet 8-1/2 inches, body surface area 2.04 meters squared.  Blood  pressure 180/86.  The patient has not taken his blood pressure medicine in the last couple of days because he ran out.  He is going to get that refilled today.  Other vital signs are normal.  HEENT:  There is no scleral icterus.  Mouth and pharynx are benign.  No evidence for recurrent tumor in the region of the right parotid gland and neck.  No pain or tenderness.  The patient has a right- sided Port-A-Cath which was flushed with heparin today.  We are flushing this every 2 months.  He has a pacemaker on the left.  Lungs:   Clear to percussion and auscultation.  Cardiac:  Regular rhythm with systolic ejection murmur.  Left axilla continues to show some masses which could be lymphadenopathy.  We have not pursued this.  Of note is the fact that the patient did have a PET scan on 09/10/2009 and that exam was negative.  There were no hypermetabolic axillary lymph nodes.  The patient does have gallstones.  Abdomen:  Benign with no organomegaly, masses palpable.  Extremities:  1 to 2+ edema of both lower legs, right greater than left.  LABORATORY DATA:  Today white count is 5.9, ANC 3.5, hemoglobin 14.5, hematocrit 41.2 and platelets 180,000.  Chemistries from today were notable for a potassium of 3.3 and an AST of 52.  TSH on 12/22/2010 was 2.17.  I believe a TSH is pending today.  The patient did have a chest x- ray on 09/03/2010 that showed cardiomegaly without any acute disease.  IMPRESSION AND PLAN:  [name] appears to be disease-free from his right parotid tumor high-grade mucoepidermoid carcinoma which dates back now 4-1/2 years.  He remains under treatment and careful surveillance regarding his urothelial cancer of the bladder.  I believe he sees Dr. Vernie [name] every 3 months and has repeat cystoscopies.  The patient's other medical problems, particularly his cardiac problems appear to be under stable control and being monitored by Dr. Susa [name].  I asked [name] when he was last seen by Dr. Annalee [name].  Apparently he has not seen him since the beginning of the year.  I urged him to make an appointment with Dr. Annalee [name].  Patient's potassium today is 3.3 and we will forward those results to his primary physician, Dr. Guerry [name].  As stated above, the patient's last PET scan was carried out on 09/10/2009.  We will continue to flush the Port-A-Cath with heparin every 2 months. We will plan to see [name] again in 4 months at which time we will check CBC and chemistries.  Mr.  [name] will be due for a chest x-ray at that time as well.    ______________________________ [name], M.D. DSM/MEDQ  D:  05/19/2011  T:  05/19/2011  Job:  [id number]

## 2011-05-20 ENCOUNTER — Encounter: Payer: Self-pay | Admitting: Medical Oncology

## 2011-05-20 NOTE — Progress Notes (Signed)
Labs faxed from 05/19/11 faxed to Dr. Wylene Simmer

## 2011-06-12 ENCOUNTER — Encounter: Payer: Self-pay | Admitting: *Deleted

## 2011-06-12 DIAGNOSIS — C801 Malignant (primary) neoplasm, unspecified: Secondary | ICD-10-CM | POA: Insufficient documentation

## 2011-06-12 NOTE — Progress Notes (Signed)
Follow up  Mucoepidermoid carcinoma of right parotid gland treated with resection and adjuvant radiotherapy Radiation Therapy= 05/05/07-06/15/07   Hx chemotherapy carboplatin/67fu

## 2011-06-25 ENCOUNTER — Ambulatory Visit
Admission: RE | Admit: 2011-06-25 | Discharge: 2011-06-25 | Disposition: A | Discharge status: Home / Self Care | Payer: Medicare Other | Source: Ambulatory Visit | Attending: Radiation Oncology | Admitting: Radiation Oncology

## 2011-06-25 ENCOUNTER — Ambulatory Visit: Payer: Medicare Other | Admitting: Radiation Oncology

## 2011-06-25 ENCOUNTER — Encounter: Payer: Self-pay | Admitting: Radiation Oncology

## 2011-06-25 VITALS — BP 187/92 | HR 90 | Temp 97.6°F | Resp 20 | Wt 188.6 lb

## 2011-06-25 DIAGNOSIS — C07 Malignant neoplasm of parotid gland: Secondary | ICD-10-CM

## 2011-06-25 DIAGNOSIS — R52 Pain, unspecified: Secondary | ICD-10-CM

## 2011-06-25 MED ORDER — HYDROCODONE-ACETAMINOPHEN 7.5-500 MG PO TABS: 1.0000 | ORAL_TABLET | Freq: Four times a day (QID) | ORAL | Status: DC | PRN

## 2011-06-25 MED ORDER — HYDROCODONE-ACETAMINOPHEN 7.5-500 MG PO TABS: 1.0000 | ORAL_TABLET | Freq: Four times a day (QID) | ORAL | Status: AC | PRN

## 2011-06-25 NOTE — Progress Notes (Addendum)
   Department of Radiation Oncology  Phone:  [phone] Fax:        [phone]   CC: Samul Dada, M.D.  [name]. [name], M.D.  [name], M.D.  [name]. [name], M.D.   DIAGNOSIS:  This is a 78 year old gentleman with a history of high-grade 3.2-cm mucoepidermoid carcinoma of the right parotid gland treated with resection and adjuvant radiotherapy.  INTERVAL SINCE LAST RADIATION:  4 years.  NARRATIVE:  [name] returns today for routine followup assessment.  He is essentially without complaint related to his parotid/neck.  He reports very limited xerostomia.  Denies any TMJ symptoms on the right.  He fell and twisted his right ankle on 12/24, and has had pain/swelling there since.  PHYSICAL EXAMINATION:  The patient is in no acute distress today.  He is alert and oriented.   Filed Vitals:   06/25/11 1417  BP: 187/92  Pulse: 90  Temp: 97.6 F (36.4 C)  Resp: 20  The right angle of the mandible and zygomatic arch are prominent due to a soft tissue defect at the site of the parotid resection and radiotherapy.  Palpation of this area reveals no distinct nodularity.  The right neck and left neck are free of any appreciable lymphadenopathy.  There is some tenderness in the mid-back related to radiation fibrosis. The patient has ankle edema bilaterally with stasis skin changes/discoloration.  It is slightly asymetric with more edema on the right.  There are no palpable cords, negative homans sign.  IMPRESSION:  [name] currently exhibits no evidence of recurrence.  He does have some right ankle pain/swelling following a fall, but is able to bear weight.  I suspect this is a sprain.  It is improving.  PLAN:  The patient will follow up in our office for routine evaluation in 6 months.  He will contact Dr. Onnie Boer office if ankle swelling worsens.  ------------------------------------------------  Artist Pais Kathrynn Running, M.D.

## 2011-06-25 NOTE — Progress Notes (Signed)
Pt here for f/u, rad therapy txs=05/05/07-06/15/07 right parotid rith resection   Widowed Allergies:Codeine=blisters Right foot bluish tinted,swollen,tight, scaly skin, warm to touch on calf, c/o throbbing, no meds taken pt fell and   Twisted 06/15/11,

## 2011-06-25 NOTE — Progress Notes (Signed)
Encounter addended by: [name], MD on: 06/25/2011  2:43 PM<BR>     Documentation filed: Visit Diagnoses, Orders

## 2011-08-22 ENCOUNTER — Emergency Department (HOSPITAL_COMMUNITY): Payer: Medicare Other

## 2011-08-22 ENCOUNTER — Other Ambulatory Visit: Payer: Self-pay

## 2011-08-22 ENCOUNTER — Emergency Department (HOSPITAL_COMMUNITY)
Admission: EM | Admit: 2011-08-22 | Discharge: 2011-08-22 | Disposition: A | Discharge status: Home / Self Care | Payer: Medicare Other | Attending: Emergency Medicine | Admitting: Emergency Medicine

## 2011-08-22 ENCOUNTER — Encounter (HOSPITAL_COMMUNITY): Payer: Self-pay | Admitting: *Deleted

## 2011-08-22 DIAGNOSIS — M129 Arthropathy, unspecified: Secondary | ICD-10-CM | POA: Insufficient documentation

## 2011-08-22 DIAGNOSIS — E785 Hyperlipidemia, unspecified: Secondary | ICD-10-CM | POA: Insufficient documentation

## 2011-08-22 DIAGNOSIS — S0990XA Unspecified injury of head, initial encounter: Secondary | ICD-10-CM | POA: Insufficient documentation

## 2011-08-22 DIAGNOSIS — R296 Repeated falls: Secondary | ICD-10-CM | POA: Insufficient documentation

## 2011-08-22 DIAGNOSIS — Z7982 Long term (current) use of aspirin: Secondary | ICD-10-CM | POA: Insufficient documentation

## 2011-08-22 DIAGNOSIS — E119 Type 2 diabetes mellitus without complications: Secondary | ICD-10-CM | POA: Insufficient documentation

## 2011-08-22 DIAGNOSIS — I10 Essential (primary) hypertension: Secondary | ICD-10-CM | POA: Insufficient documentation

## 2011-08-22 DIAGNOSIS — R609 Edema, unspecified: Secondary | ICD-10-CM | POA: Insufficient documentation

## 2011-08-22 DIAGNOSIS — Z8551 Personal history of malignant neoplasm of bladder: Secondary | ICD-10-CM | POA: Insufficient documentation

## 2011-08-22 DIAGNOSIS — Z79899 Other long term (current) drug therapy: Secondary | ICD-10-CM | POA: Insufficient documentation

## 2011-08-22 DIAGNOSIS — S0101XA Laceration without foreign body of scalp, initial encounter: Secondary | ICD-10-CM

## 2011-08-22 DIAGNOSIS — F1010 Alcohol abuse, uncomplicated: Secondary | ICD-10-CM | POA: Insufficient documentation

## 2011-08-22 DIAGNOSIS — Y92009 Unspecified place in unspecified non-institutional (private) residence as the place of occurrence of the external cause: Secondary | ICD-10-CM | POA: Insufficient documentation

## 2011-08-22 DIAGNOSIS — S0100XA Unspecified open wound of scalp, initial encounter: Secondary | ICD-10-CM | POA: Insufficient documentation

## 2011-08-22 DIAGNOSIS — I4891 Unspecified atrial fibrillation: Secondary | ICD-10-CM | POA: Insufficient documentation

## 2011-08-22 DIAGNOSIS — I872 Venous insufficiency (chronic) (peripheral): Secondary | ICD-10-CM | POA: Insufficient documentation

## 2011-08-22 DIAGNOSIS — W19XXXA Unspecified fall, initial encounter: Secondary | ICD-10-CM

## 2011-08-22 DIAGNOSIS — Z950 Presence of cardiac pacemaker: Secondary | ICD-10-CM | POA: Insufficient documentation

## 2011-08-22 LAB — CBC
MCH: 36.8 pg — ABNORMAL HIGH (ref 26.0–34.0)
MCHC: 36.5 g/dL — ABNORMAL HIGH (ref 30.0–36.0)
Platelets: 142 10*3/uL — ABNORMAL LOW (ref 150–400)

## 2011-08-22 LAB — BASIC METABOLIC PANEL WITH GFR
BUN: 9 mg/dL (ref 6–23)
CO2: 26 meq/L (ref 19–32)
Calcium: 9.4 mg/dL (ref 8.4–10.5)
Chloride: 98 meq/L (ref 96–112)
Creatinine, Ser: 0.64 mg/dL (ref 0.50–1.35)
GFR calc Af Amer: >90 mL/min
GFR calc non Af Amer: >90 mL/min
Glucose, Bld: 113 mg/dL — ABNORMAL HIGH (ref 70–99)
Potassium: 3.3 meq/L — ABNORMAL LOW (ref 3.5–5.1)
Sodium: 140 meq/L (ref 135–145)

## 2011-08-22 LAB — ETHANOL: Alcohol, Ethyl (B): 171 mg/dL — ABNORMAL HIGH (ref 0–11)

## 2011-08-22 MED ORDER — TETANUS-DIPHTH-ACELL PERTUSSIS 5-2.5-18.5 LF-MCG/0.5 IM SUSP
0.5000 mL | Freq: Once | INTRAMUSCULAR | Status: AC
  Administered 2011-08-22: 0.5 mL via INTRAMUSCULAR
  Filled 2011-08-22: qty 0.5

## 2011-08-22 NOTE — ED Notes (Signed)
Pt has been drinking and has taken Ambien, pt does not remember falling. Lac to the back of his head. Immobilized on arrival. Denies neck/back pain. Pt has demand pacemaker, 100% paced at this time.

## 2011-08-22 NOTE — ED Provider Notes (Signed)
Medical screening examination/treatment/procedure(s) were conducted as a shared visit with non-physician practitioner(s) and myself.  I personally evaluated the patient during the encounter  Ethelda Chick, MD 08/22/11 [phone]

## 2011-08-22 NOTE — ED Provider Notes (Signed)
History     CSN: [id number]  Arrival date & time 08/22/11  0441   First MD Initiated Contact with Patient 08/22/11 [phone]      Chief Complaint  Patient presents with  . Fall    (Consider location/radiation/quality/duration/timing/severity/associated sxs/prior treatment) HPI Patient presents after a fall at his home. He was brought in by EMS after daughter called them do to patient falling. Patient endorses drinking alcohol tonight and has also taken Ambien for sleep. He remembers the fall and states he fell across a table. He denies loss of consciousness. He has a laceration to his posterior scalp. Bleeding was controlled by EMS prior to arrival. He does take Plavix. He denies vomiting or seizure activity. He denies pain in his chest abdomen back or extremities. He denies any palpitations or other systemic symptoms. There are no alleviating or modifying factors.  Past Medical History  Diagnosis Date  . Atrial fibrillation 1990  . Dyslipidemia   . Diabetes mellitus   . Prostate hypertrophy   . Arthritis     degenerative-knees  . Bladder cancer 2010  . 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 History of radiation therapy 05/05/07-06/15/07    right parotid through subclavicular region/ mid jugularlymph node chain  . Allergy   . Hypertension     labile  . Heart murmur     2/6 systolic  . History of chemotherapy      Hx carboplatin/79fu  . Xerostomia     limited    Past Surgical History  Procedure Date  . Pacemaker insertion 1990  . Cystoscopy 01/2011    neg    Family History  Problem Relation Age of Onset  . Heart disease Mother   . Cancer Father   . Emphysema Brother   . Coronary artery disease Son     History  Substance Use Topics  . Smoking status: Never Smoker   . Smokeless tobacco: Not on file  . Alcohol Use: 2.0 oz/week    4 drink(s) per week      Review of Systems ROS reviewed and otherwise negative except for mentioned in HPI  Allergies  Codeine and  Docetaxel  Home Medications   Current Outpatient Rx  Name Route Sig Dispense Refill  . ASPIRIN 81 MG PO TABS Oral Take 81 mg by mouth daily.     . ATORVASTATIN CALCIUM 40 MG PO TABS Oral Take 40 mg by mouth daily.     Marland Kitchen CLOPIDOGREL BISULFATE 75 MG PO TABS Oral Take 75 mg by mouth daily.     Marland Kitchen DIGOXIN 0.25 MG PO TABS Oral Take 125 mcg by mouth daily.    Marland Kitchen METOPROLOL SUCCINATE ER 25 MG PO TB24 Oral Take 25 mg by mouth daily.     . ADULT MULTIVITAMIN W/MINERALS CH Oral Take 1 tablet by mouth daily.    Marland Kitchen PANTOPRAZOLE SODIUM 40 MG PO TBEC Oral Take 40 mg by mouth daily.     Marland Kitchen PIOGLITAZONE HCL 15 MG PO TABS Oral Take 15 mg by mouth daily.    Marland Kitchen RAMIPRIL 10 MG PO TABS Oral Take 10 mg by mouth daily.     Marland Kitchen ZOLPIDEM TARTRATE 10 MG PO TABS Oral Take 10 mg by mouth at bedtime as needed. For sleep      BP 140/82  Pulse 90  Temp(Src) 98 F (36.7 C) (Oral)  Resp 18  SpO2 99% Vitals reviewed Physical Exam Physical Examination: General appearance - alert, well appearing, and in no  distress Mental status - alert, oriented to person, place, not to time Eyes - pupils equal and reactive, extraocular eye movements intact Ears - no hemotympanum Neck - c-collar in place- no midline cspine tenderness Chest - clear to auscultation, no wheezes, rales or rhonchi, symmetric air entry, no crepitus, nontender Heart - normal rate, regular rhythm, normal S1, S2, no murmurs, rubs, clicks or gallops Abdomen - soft, nontender, nondistended, no masses or organomegaly Back exam - no midline tenderness of c/t/l spine Neurological - alert, oriented, normal speech, no focal findings, strength/sensation intact in extremities x 4 Musculoskeletal - no joint tenderness, deformity or focal swelling Extremities - peripheral pulses normal, 2+ lower extremity edema with venous stasis changes,  no clubbing or cyanosis Skin - chronic venous stasis changes, multiple scabs/bruises in multiple stages of healing over forearms,  laceration to posterior scalp approx3-4cm- no active bleeding  ED Course  Procedures (including critical care time)  Date: 08/22/2011  Rate: 69  Rhythm: paced rhythm  QRS Axis: left  Intervals: paced rhythm  ST/T Wave abnormalities: indeterminate  Conduction Disutrbances:paced rhythm  Narrative Interpretation:   Old EKG Reviewed: none available    Labs Reviewed  CBC - Abnormal; Notable for the following:    RBC 4.00 (*)    MCV 100.8 (*)    MCH 36.8 (*)    MCHC 36.5 (*)    Platelets 142 (*)    All other components within normal limits  BASIC METABOLIC PANEL - Abnormal; Notable for the following:    Potassium 3.3 (*)    Glucose, Bld 113 (*)    All other components within normal limits  ETHANOL - Abnormal; Notable for the following:    Alcohol, Ethyl (B) 171 (*)    All other components within normal limits   Ct Head Wo Contrast  08/22/2011  *RADIOLOGY REPORT*  Clinical Data:  Status post fall; laceration to the back of the head.  Discern for cervical spine injury.  The patient does not remember falling.  On Plavix.  CT HEAD WITHOUT CONTRAST AND CT CERVICAL SPINE WITHOUT CONTRAST  Technique:  Multidetector CT imaging of the head and cervical spine was performed following the standard protocol without intravenous contrast.  Multiplanar CT image reconstructions of the cervical spine were also generated.  Comparison: CT of the neck performed 12/08/2006  CT HEAD  Findings: There is no evidence of acute infarction, mass lesion, or intra- or extra-axial hemorrhage on CT.  Diffuse prominence of the ventricles and sulci reflects moderate cortical volume loss.  Cerebellar atrophy is noted.  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  Scattered periventricular and subcortical white matter change likely reflects small vessel ischemic microangiopathy.  The brainstem and fourth ventricle are within normal limits.  The basal ganglia are unremarkable in  appearance.  The cerebral hemispheres demonstrate grossly normal gray-white differentiation. No mass effect or midline shift is seen.  There is no evidence of fracture; visualized osseous structures are unremarkable in appearance.  The orbits are within normal limits. There is complete opacification of the right mastoid air cells and diminutive sphenoid sinus, and partial opacification of the ethmoid air cells.  The remaining paranasal sinuses and mastoid air cells are well-aerated.  A prominent soft tissue hematoma and laceration is noted along the occiput, with scattered soft tissue air.  IMPRESSION:  1.  No evidence of traumatic intracranial injury or fracture. 2.  Prominent soft tissue hematoma and laceration along the occiput, with scattered soft tissue  air. 3.  Moderate cortical volume loss and scattered small vessel ischemic microangiopathy. 4.  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  CT CERVICAL SPINE  Findings: There is no evidence of fracture or subluxation. Vertebral bodies demonstrate normal height and alignment.  There is intervertebral disc space narrowing along the lower cervical spine, with associated anterior and posterior disc osteophyte complexes. Mild degenerative change is noted about the dens.  Facet disease is noted along the cervical spine.  Prevertebral soft tissues are within normal limits.  The visualized portions of the thyroid gland are unremarkable in appearance.  The minimally visualized lung apices are clear. Calcification is noted at the carotid bifurcations bilaterally.  IMPRESSION:  1.  No evidence of fracture or subluxation along the cervical spine. 2.  Mild degenerative change along the lower cervical spine. 3.  Calcification at the carotid bifurcations bilaterally.  Original Report Authenticated By: Tonia Ghent, M.D.   Ct Cervical Spine Wo Contrast  08/22/2011  *RADIOLOGY REPORT*  Clinical Data:  Status post fall; laceration to the back  of the head.  Discern for cervical spine injury.  The patient does not remember falling.  On Plavix.  CT HEAD WITHOUT CONTRAST AND CT CERVICAL SPINE WITHOUT CONTRAST  Technique:  Multidetector CT imaging of the head and cervical spine was performed following the standard protocol without intravenous contrast.  Multiplanar CT image reconstructions of the cervical spine were also generated.  Comparison: CT of the neck performed 12/08/2006  CT HEAD  Findings: There is no evidence of acute infarction, mass lesion, or intra- or extra-axial hemorrhage on CT.  Diffuse prominence of the ventricles and sulci reflects moderate cortical volume loss.  Cerebellar atrophy is noted.  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  Scattered periventricular and subcortical white matter change likely reflects small vessel ischemic microangiopathy.  The brainstem and fourth ventricle are within normal limits.  The basal ganglia are unremarkable in appearance.  The cerebral hemispheres demonstrate grossly normal gray-white differentiation. No mass effect or midline shift is seen.  There is no evidence of fracture; visualized osseous structures are unremarkable in appearance.  The orbits are within normal limits. There is complete opacification of the right mastoid air cells and diminutive sphenoid sinus, and partial opacification of the ethmoid air cells.  The remaining paranasal sinuses and mastoid air cells are well-aerated.  A prominent soft tissue hematoma and laceration is noted along the occiput, with scattered soft tissue air.  IMPRESSION:  1.  No evidence of traumatic intracranial injury or fracture. 2.  Prominent soft tissue hematoma and laceration along the occiput, with scattered soft tissue air. 3.  Moderate cortical volume loss and scattered small vessel ischemic microangiopathy. 4.  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  CT CERVICAL SPINE   Findings: There is no evidence of fracture or subluxation. Vertebral bodies demonstrate normal height and alignment.  There is intervertebral disc space narrowing along the lower cervical spine, with associated anterior and posterior disc osteophyte complexes. Mild degenerative change is noted about the dens.  Facet disease is noted along the cervical spine.  Prevertebral soft tissues are within normal limits.  The visualized portions of the thyroid gland are unremarkable in appearance.  The minimally visualized lung apices are clear. Calcification is noted at the carotid bifurcations bilaterally.  IMPRESSION:  1.  No evidence of fracture or subluxation along the cervical spine. 2.  Mild degenerative change along the lower cervical  spine. 3.  Calcification at the carotid bifurcations bilaterally.  Original Report Authenticated By: Tonia Ghent, M.D.     1. Fall   2. Alcohol abuse   3. Head injury   4. Scalp laceration       MDM  Patient presents after a fall at home tonight. He endorses drinking alcohol. He does have a pacemaker and his EKG shows a paced rhythm. He has a scalp laceration. His CT scan of his head and cervical spine were both reassuring with chronic changes on his head CT. His c-collar was cleared by me.  His tetanus was updated.  Scalp laceration repaired by PA.          Ethelda Chick, MD 08/22/11 [phone]

## 2011-08-22 NOTE — ED Notes (Signed)
Pt from home.  Lives with his daughter.  States that he had 3-4 drinks of [place] tonight.  Also reports taking an [employer].  Pt fell earlier tonight at his home, EMS was called and pt refused transport.  Pt fell again in the kitchen-hit head on kitchen table.  Pt does not remember fall.  Unknown LOC.  Pt has a 2 inch lac on the back of his head.  Bleeding controlled by EMS PTA.  Pt A/O x 4.  No distress noted. PERRLA

## 2011-08-22 NOTE — ED Notes (Signed)
Pt moving all extremities without difficulty.  Resting comfortably.  A/O x 4.

## 2011-08-22 NOTE — ED Notes (Signed)
I perforemed wound care on pt. Pt has 1 large lac on back of head and 1 small lac on back of pt's head. 7:31 am JG.

## 2011-08-22 NOTE — ED Notes (Signed)
Pt discharged home. Family member at bedside. Had no further questions at the time. Wheelchair to triage. No signs of distress. Pt and family member given resources for alcohol detox.

## 2011-08-22 NOTE — ED Notes (Signed)
EDP aware of need to remove pt from backboard to fully access laceration.  Pt is on Plavix.  Bleeding does appear to be controlled although it cannot be seen fully.

## 2011-08-22 NOTE — Discharge Instructions (Signed)
Return to the ED with any concerns including vomiting, seizure activity, increased pain redness or pus draining from her wound, palpitations, chest pain, decreased level of alertness or lethargy, or any other alarming symptoms.

## 2011-08-22 NOTE — ED Notes (Signed)
Pt resting quietly at the time. Wound care performed, pt tolerated without difficulty. Denies pain. Vital signs stable. No signs of distress noted. He remains alert and oriented x4.

## 2011-08-22 NOTE — ED Provider Notes (Signed)
Per Dr. Dellie Burns request, scalp laceration repair performed by me.    LACERATION REPAIR Performed by: Fayrene Helper Authorized byFayrene Helper Consent: Verbal consent obtained. Risks and benefits: risks, benefits and alternatives were discussed Consent given by: patient Patient identity confirmed: provided demographic data Prepped and Draped in normal sterile fashion Wound explored  Laceration Location: posterior scalp  Laceration Length: 3cm  No Foreign Bodies seen or palpated  Anesthesia: local infiltration  Local anesthetic: none  Anesthetic total: n/a  Irrigation method: syringe Amount of cleaning: standard  Skin closure: staple  Number of sutures: 3  Technique: application of staples  Patient tolerance: Patient tolerated the procedure well with no immediate complications.  Fayrene Helper, PA-C 08/22/11 0740

## 2011-09-17 ENCOUNTER — Other Ambulatory Visit: Payer: Medicare Other | Admitting: Lab

## 2011-09-17 ENCOUNTER — Ambulatory Visit: Payer: Medicare Other | Admitting: Oncology

## 2011-09-28 HISTORY — PX: OTHER SURGICAL HISTORY: SHX169

## 2011-10-02 ENCOUNTER — Emergency Department (HOSPITAL_COMMUNITY): Payer: Medicare Other

## 2011-10-02 ENCOUNTER — Encounter (HOSPITAL_COMMUNITY): Payer: Self-pay

## 2011-10-02 ENCOUNTER — Inpatient Hospital Stay (HOSPITAL_COMMUNITY)
Admission: EM | Admit: 2011-10-02 | Discharge: 2011-10-09 | DRG: 064 | Disposition: A | Discharge status: Skilled Nursing Facility | Payer: Medicare Other | Attending: Family Medicine | Admitting: Family Medicine

## 2011-10-02 DIAGNOSIS — Z8673 Personal history of transient ischemic attack (TIA), and cerebral infarction without residual deficits: Secondary | ICD-10-CM

## 2011-10-02 DIAGNOSIS — Z79899 Other long term (current) drug therapy: Secondary | ICD-10-CM

## 2011-10-02 DIAGNOSIS — M129 Arthropathy, unspecified: Secondary | ICD-10-CM | POA: Diagnosis present

## 2011-10-02 DIAGNOSIS — G459 Transient cerebral ischemic attack, unspecified: Secondary | ICD-10-CM

## 2011-10-02 DIAGNOSIS — Z8551 Personal history of malignant neoplasm of bladder: Secondary | ICD-10-CM

## 2011-10-02 DIAGNOSIS — I4821 Permanent atrial fibrillation: Secondary | ICD-10-CM | POA: Diagnosis present

## 2011-10-02 DIAGNOSIS — Z9181 History of falling: Secondary | ICD-10-CM

## 2011-10-02 DIAGNOSIS — N390 Urinary tract infection, site not specified: Secondary | ICD-10-CM | POA: Diagnosis present

## 2011-10-02 DIAGNOSIS — R7401 Elevation of levels of liver transaminase levels: Secondary | ICD-10-CM | POA: Diagnosis present

## 2011-10-02 DIAGNOSIS — Z7982 Long term (current) use of aspirin: Secondary | ICD-10-CM

## 2011-10-02 DIAGNOSIS — S065XAA Traumatic subdural hemorrhage with loss of consciousness status unknown, initial encounter: Secondary | ICD-10-CM

## 2011-10-02 DIAGNOSIS — R7402 Elevation of levels of lactic acid dehydrogenase (LDH): Secondary | ICD-10-CM | POA: Diagnosis present

## 2011-10-02 DIAGNOSIS — Z888 Allergy status to other drugs, medicaments and biological substances status: Secondary | ICD-10-CM

## 2011-10-02 DIAGNOSIS — I6200 Nontraumatic subdural hemorrhage, unspecified: Principal | ICD-10-CM | POA: Diagnosis present

## 2011-10-02 DIAGNOSIS — F1020 Alcohol dependence, uncomplicated: Secondary | ICD-10-CM | POA: Diagnosis present

## 2011-10-02 DIAGNOSIS — J810 Acute pulmonary edema: Secondary | ICD-10-CM

## 2011-10-02 DIAGNOSIS — Z950 Presence of cardiac pacemaker: Secondary | ICD-10-CM

## 2011-10-02 DIAGNOSIS — R4701 Aphasia: Secondary | ICD-10-CM | POA: Diagnosis present

## 2011-10-02 DIAGNOSIS — Z7902 Long term (current) use of antithrombotics/antiplatelets: Secondary | ICD-10-CM

## 2011-10-02 DIAGNOSIS — D696 Thrombocytopenia, unspecified: Secondary | ICD-10-CM | POA: Diagnosis present

## 2011-10-02 DIAGNOSIS — I4891 Unspecified atrial fibrillation: Secondary | ICD-10-CM

## 2011-10-02 DIAGNOSIS — E785 Hyperlipidemia, unspecified: Secondary | ICD-10-CM | POA: Diagnosis present

## 2011-10-02 DIAGNOSIS — I10 Essential (primary) hypertension: Secondary | ICD-10-CM

## 2011-10-02 DIAGNOSIS — E119 Type 2 diabetes mellitus without complications: Secondary | ICD-10-CM | POA: Diagnosis present

## 2011-10-02 DIAGNOSIS — F1010 Alcohol abuse, uncomplicated: Secondary | ICD-10-CM | POA: Diagnosis present

## 2011-10-02 DIAGNOSIS — G4700 Insomnia, unspecified: Secondary | ICD-10-CM | POA: Diagnosis not present

## 2011-10-02 DIAGNOSIS — E876 Hypokalemia: Secondary | ICD-10-CM | POA: Diagnosis not present

## 2011-10-02 DIAGNOSIS — S065X9A Traumatic subdural hemorrhage with loss of consciousness of unspecified duration, initial encounter: Secondary | ICD-10-CM

## 2011-10-02 DIAGNOSIS — M25569 Pain in unspecified knee: Secondary | ICD-10-CM | POA: Diagnosis not present

## 2011-10-02 DIAGNOSIS — R479 Unspecified speech disturbances: Secondary | ICD-10-CM

## 2011-10-02 DIAGNOSIS — I609 Nontraumatic subarachnoid hemorrhage, unspecified: Secondary | ICD-10-CM | POA: Diagnosis present

## 2011-10-02 DIAGNOSIS — F10931 Alcohol use, unspecified with withdrawal delirium: Secondary | ICD-10-CM

## 2011-10-02 DIAGNOSIS — F10231 Alcohol dependence with withdrawal delirium: Secondary | ICD-10-CM | POA: Diagnosis not present

## 2011-10-02 LAB — URINALYSIS, ROUTINE W REFLEX MICROSCOPIC
Nitrite: POSITIVE — AB
Protein, ur: 30 mg/dL — AB
Specific Gravity, Urine: 1.02 (ref 1.005–1.030)
Urobilinogen, UA: >8 mg/dL — ABNORMAL HIGH (ref 0.0–1.0)

## 2011-10-02 LAB — DIFFERENTIAL
Basophils Absolute: 0 10*3/uL (ref 0.0–0.1)
Basophils Relative: 0 % (ref 0–1)
Eosinophils Relative: 0 % (ref 0–5)
Lymphocytes Relative: 10 % — ABNORMAL LOW (ref 12–46)

## 2011-10-02 LAB — COMPREHENSIVE METABOLIC PANEL
ALT: 21 U/L (ref 0–53)
AST: 48 U/L — ABNORMAL HIGH (ref 0–37)
Albumin: 3.6 g/dL (ref 3.5–5.2)
CO2: 25 mEq/L (ref 19–32)
Calcium: 8.4 mg/dL (ref 8.4–10.5)
Creatinine, Ser: 0.74 mg/dL (ref 0.50–1.35)
GFR calc non Af Amer: 85 mL/min — ABNORMAL LOW (ref >90)
Sodium: 135 mEq/L (ref 135–145)
Total Protein: 6.8 g/dL (ref 6.0–8.3)

## 2011-10-02 LAB — CBC
MCHC: 35.6 g/dL (ref 30.0–36.0)
MCV: 100.8 fL — ABNORMAL HIGH (ref 78.0–100.0)
Platelets: 125 10*3/uL — ABNORMAL LOW (ref 150–400)
RDW: 13.4 % (ref 11.5–15.5)
WBC: 5.5 10*3/uL (ref 4.0–10.5)

## 2011-10-02 LAB — LACTIC ACID, PLASMA: Lactic Acid, Venous: 1.6 mmol/L (ref 0.5–2.2)

## 2011-10-02 LAB — SALICYLATE LEVEL: Salicylate Lvl: <2 mg/dL — ABNORMAL LOW (ref 2.8–20.0)

## 2011-10-02 LAB — RAPID URINE DRUG SCREEN, HOSP PERFORMED
Amphetamines: NOT DETECTED
Barbiturates: NOT DETECTED
Benzodiazepines: NOT DETECTED
Cocaine: NOT DETECTED

## 2011-10-02 LAB — URINE MICROSCOPIC-ADD ON

## 2011-10-02 LAB — ETHANOL: Alcohol, Ethyl (B): <11 mg/dL (ref 0–11)

## 2011-10-02 LAB — PROTIME-INR: INR: 1.13 (ref 0.00–1.49)

## 2011-10-02 MED ORDER — LORAZEPAM 2 MG/ML IJ SOLN
INTRAMUSCULAR | Status: AC
  Administered 2011-10-02: 1 mg via INTRAVENOUS
  Filled 2011-10-02: qty 1

## 2011-10-02 MED ORDER — LORAZEPAM 2 MG/ML IJ SOLN
1.0000 mg | Freq: Once | INTRAMUSCULAR | Status: AC
  Administered 2011-10-02: 1 mg via INTRAVENOUS

## 2011-10-02 MED ORDER — DEXTROSE 5 % IV SOLN
1.0000 g | Freq: Once | INTRAVENOUS | Status: AC
  Administered 2011-10-02: 1 g via INTRAVENOUS
  Filled 2011-10-02: qty 10

## 2011-10-02 MED ORDER — SODIUM CHLORIDE 0.9 % IV BOLUS (SEPSIS)
1000.0000 mL | Freq: Once | INTRAVENOUS | Status: AC
  Administered 2011-10-02: 1000 mL via INTRAVENOUS

## 2011-10-02 MED ORDER — SODIUM CHLORIDE 0.9 % IV SOLN
Freq: Once | INTRAVENOUS | Status: AC
  Administered 2011-10-02: 23:00:00 via INTRAVENOUS

## 2011-10-02 NOTE — ED Notes (Signed)
Admitting MD at bedside.

## 2011-10-02 NOTE — ED Provider Notes (Signed)
History     CSN: [id number]  Arrival date & time 10/02/11  2952   First MD Initiated Contact with Patient 10/02/11 1942      Chief Complaint  Patient presents with  . Altered Mental Status   Patient is a 78 y.o. male presenting with altered mental status. The history is provided by a relative and the patient. No language interpreter was used.  Altered Mental Status This is a new problem. The current episode started today. The problem occurs rarely. The problem has been unchanged. Pertinent negatives include no abdominal pain, chest pain, chills, coughing, fever, headaches, nausea, neck pain, numbness, rash, vomiting or weakness. Associated symptoms comments: Pt denies all complaints. Exacerbated by: Pt unable to give me answer. He has tried nothing for the symptoms.    Past Medical History  Diagnosis Date  . Atrial fibrillation 1990  . Dyslipidemia   . Diabetes mellitus   . Prostate hypertrophy   . Arthritis     degenerative-knees  . Bladder cancer 2010  . 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 History of radiation therapy 05/05/07-06/15/07    right parotid through subclavicular region/ mid jugularlymph node chain  . Allergy   . Hypertension     labile  . Heart murmur     2/6 systolic  . History of chemotherapy      Hx carboplatin/41fu  . Xerostomia     limited    Past Surgical History  Procedure Date  . Pacemaker insertion 1990  . Cystoscopy 01/2011    neg    Family History  Problem Relation Age of Onset  . Heart disease Mother   . Cancer Father   . Emphysema Brother   . Coronary artery disease Son     History  Substance Use Topics  . Smoking status: Never Smoker   . Smokeless tobacco: Not on file  . Alcohol Use: 2.0 oz/week    4 drink(s) per week      Review of Systems  Constitutional: Negative for fever, chills, activity change and appetite change.  HENT: Negative for nosebleeds, neck pain and neck stiffness.   Eyes: Negative for visual disturbance.    Respiratory: Negative for cough, chest tightness and shortness of breath.   Cardiovascular: Negative for chest pain, palpitations and leg swelling.       Chronic and unchanged RLE swelling.   Gastrointestinal: Negative for nausea, vomiting, abdominal pain, diarrhea, constipation, blood in stool and abdominal distention.  Genitourinary: Positive for frequency. Negative for dysuria, urgency, hematuria and difficulty urinating.  Musculoskeletal: Positive for gait problem. Negative for back pain.       Chronic difficulty ambulating-unchanged.   Skin: Negative for rash.  Neurological: Positive for speech difficulty. Negative for dizziness, tremors, seizures, syncope, facial asymmetry, weakness, light-headedness, numbness and headaches.  Hematological: Negative for adenopathy. Does not bruise/bleed easily.  Psychiatric/Behavioral: Positive for confusion and altered mental status. Negative for suicidal ideas, hallucinations, behavioral problems, self-injury and agitation.    Allergies  Codeine and Docetaxel  Home Medications   Current Outpatient Rx  Name Route Sig Dispense Refill  . ASPIRIN 81 MG PO TABS Oral Take 81 mg by mouth daily.     . ATORVASTATIN CALCIUM 40 MG PO TABS Oral Take 40 mg by mouth daily.     Marland Kitchen CLOPIDOGREL BISULFATE 75 MG PO TABS Oral Take 75 mg by mouth daily.     Marland Kitchen DIGOXIN 0.25 MG PO TABS Oral Take 125 mcg by mouth daily.    Marland Kitchen  METOPROLOL SUCCINATE ER 25 MG PO TB24 Oral Take 25 mg by mouth daily.     . ADULT MULTIVITAMIN W/MINERALS CH Oral Take 1 tablet by mouth daily.    Marland Kitchen PANTOPRAZOLE SODIUM 40 MG PO TBEC Oral Take 40 mg by mouth daily.     Marland Kitchen PIOGLITAZONE HCL 15 MG PO TABS Oral Take 15 mg by mouth daily.    Marland Kitchen RAMIPRIL 10 MG PO TABS Oral Take 10 mg by mouth daily.     Marland Kitchen ZOLPIDEM TARTRATE 10 MG PO TABS Oral Take 10 mg by mouth at bedtime as needed. For sleep      BP 174/130  Pulse 76  Temp(Src) 99.2 F (37.3 C) (Oral)  Resp 20  SpO2 100%  Physical Exam   Constitutional: He appears well-developed and well-nourished. No distress.  HENT:  Head: Normocephalic and atraumatic.  Eyes: Conjunctivae and EOM are normal. Pupils are equal, round, and reactive to light. Right eye exhibits no discharge. Left eye exhibits no discharge.  Neck: Normal range of motion. Neck supple.  Cardiovascular: Normal rate, regular rhythm, normal heart sounds and intact distal pulses.   No murmur heard. Pulmonary/Chest: Effort normal and breath sounds normal. No stridor. No respiratory distress. He has no wheezes. He has no rales. He exhibits no tenderness.  Abdominal: Soft. Bowel sounds are normal. He exhibits no distension and no mass. There is no tenderness. There is no rebound and no guarding.  Musculoskeletal: Normal range of motion. He exhibits edema. He exhibits no tenderness.       RLE 1+ pitting edema which family states is chronic and unchanged.   Neurological: He is alert. He has normal strength. He is disoriented. No cranial nerve deficit or sensory deficit. Coordination normal. GCS eye subscore is 4. GCS verbal subscore is 5. GCS motor subscore is 6.  Skin: Skin is warm, dry and intact. Bruising noted. No abrasion and no laceration noted. He is not diaphoretic.          Numerous areas of ecchymosis over R FA without signs of pain on exam.   Psychiatric: He has a normal mood and affect.    ED Course  Procedures (including critical care time)  Labs Reviewed  CBC - Abnormal; Notable for the following:    RBC 3.71 (*)    HCT 37.4 (*)    MCV 100.8 (*)    MCH 35.8 (*)    Platelets 125 (*)    All other components within normal limits  DIFFERENTIAL - Abnormal; Notable for the following:    Neutrophils Relative 85 (*)    Lymphocytes Relative 10 (*)    Lymphs Abs 0.6 (*)    All other components within normal limits  COMPREHENSIVE METABOLIC PANEL - Abnormal; Notable for the following:    Chloride 93 (*)    Glucose, Bld 125 (*)    AST 48 (*)    Total  Bilirubin 2.5 (*)    GFR calc non Af Amer 85 (*)    All other components within normal limits  URINALYSIS, ROUTINE W REFLEX MICROSCOPIC - Abnormal; Notable for the following:    Color, Urine AMBER (*) BIOCHEMICALS MAY BE AFFECTED BY COLOR   Bilirubin Urine MODERATE (*)    Ketones, ur 40 (*)    Protein, ur 30 (*)    Urobilinogen, UA >8.0 (*)    Nitrite POSITIVE (*)    Leukocytes, UA SMALL (*)    All other components within normal limits  URINE RAPID DRUG SCREEN (  HOSP PERFORMED) - Abnormal; Notable for the following:    Opiates POSITIVE (*)    All other components within normal limits  SALICYLATE LEVEL - Abnormal; Notable for the following:    Salicylate Lvl <2.0 (*)    All other components within normal limits  GLUCOSE, CAPILLARY - Abnormal; Notable for the following:    Glucose-Capillary 128 (*)    All other components within normal limits  URINE MICROSCOPIC-ADD ON - Abnormal; Notable for the following:    Bacteria, UA MANY (*)    All other components within normal limits  PROTIME-INR  ETHANOL  ACETAMINOPHEN LEVEL  TROPONIN I  LACTIC ACID, PLASMA  TSH  URINE CULTURE  CULTURE, BLOOD (ROUTINE X 2)  CULTURE, BLOOD (ROUTINE X 2)  GLUCOSE, POCT (MANUAL RESULT ENTRY)  GLUCOSE, POCT (MANUAL RESULT ENTRY)  GLUCOSE, POCT (MANUAL RESULT ENTRY)  GLUCOSE, POCT (MANUAL RESULT ENTRY)  HEMOGLOBIN A1C  CARDIAC PANEL(CRET KIN+CKTOT+MB+TROPI)  LIPID PANEL  DIGOXIN LEVEL   No results found.   1. Speech abnormality     Date: 10/02/2011  Rate: 73  Rhythm: Paced  QRS Axis: left  Intervals: QT prolonged  ST/T Wave abnormalities: nonspecific T wave changes  Conduction Disutrbances:none  Narrative Interpretation: V-paced rhythm  Old EKG Reviewed: Native beats in present EKG show TWI. No native beats present on past EKG's for comparison.  CT Head Wo Contrast (Final result)   Result time:10/02/11 2059    Final result by Rad Results In Interface (10/02/11 20:59:51)    Narrative:    *RADIOLOGY REPORT*  Clinical Data: 78 year old male with multiple falls and altered mental status.  CT HEAD WITHOUT CONTRAST  Technique: Contiguous axial images were obtained from the base of the skull through the vertex without contrast.  Comparison: 08/22/2011  Findings: A left frontoparietal subdural hematoma is identified, measuring 6 mm in the frontal region and 4 mm in the posterior parietal region.  There is no evidence of midline shift or mass effect.  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  There is no evidence of hydrocephalus, acute infarction or mass lesion.  There is no evidence of acute fracture. A right mastoid effusion and fluid in the right middle ear again noted.  IMPRESSION: Left frontoparietal subdural hematoma without evidence of mass effect or midline shift. This measures 6 mm maximally in the frontal region.  Atrophy, chronic small vessel white matter ischemic changes and remote left frontal infarct.  Unchanged right mastoid effusion and fluid in the right middle ear.  Critical Value/emergent results were called by telephone at the time of interpretation on 10/02/2011 at 8:55 p.m. to Dr. Radford Pax, who verbally acknowledged these results.  Original Report Authenticated By: Rosendo Gros, M.D.            DG Chest 1 View (Final result)   Result time:10/02/11 2033    Final result by Rad Results In Interface (10/02/11 20:33:21)    Narrative:   *RADIOLOGY REPORT*  Clinical Data: Altered mental status  CHEST - 1 VIEW  Comparison: Chest radiograph 08/24/2010  Findings: Left-sided pacemaker overlies stable cardiac silhouette. Right port in place. There is increased central venous congestion compared to prior. No focal consolidation. No pneumothorax. Degenerative osteophytosis of the thoracic spine.  IMPRESSION: Increase central venous pulmonary congestion. Mild  interstitial edema.  Original Report Authenticated By: Genevive Bi, M.D.   MDM  Pt is a 78yo heavy alcoholic M on plavix/aspirin with hx of numerous falls  who presents with altered mental status since this am. Family at [facility] providing hx. Daughter stays with pt but left pt alone last night and when she returned this am his speech was garbled. Per RN and EMS pt has had intermittent garbled speech since arriving in ED. Garbled speech present now. The only words I can understand are yes and no. No obvious s/s of trauma on head to toe exam. No focal deficits on exam other than speech. VSS. AF. NAD. Pt denies complaints now. Blood glucose 109 by EMS. AMS w/u pending.   CT head shows small L SDH deemed non-op by [facility]. Neuro consulted for garbled speech and recommends admission. Unable to MRI given PPM. Possible UTI-rocephin given. IVF given. Blood cultures pending. Given head bleed on plavix the intensivist was consulted who recommends admission to [facility]. Admitted to [facility] in stable condition.        Consuello Masse, MD 10/04/11 1610  Consuello Masse, MD 10/15/11 [phone]

## 2011-10-02 NOTE — Consult Note (Signed)
Referring Physician: Dr Radford Pax    Chief Complaint:  Acute expressive aphasia; acute small left subdural hematoma.  HPI: [name] is an 78 y.o. male history of alcohol abuse, atrial fibrillation on Coumadin diabetes mellitus, hypertension, hyperlipidemia and bladder cancer, presenting with new onset marked speech difficulty with nonsensical speech output with frequent errors. Patient has not demonstrated any difficulty with understanding what was being said to him. His previous history of left frontal stroke. CT scan of his head showed a 6 mm left frontal subdural hematoma without mass effect. Patient has a pacemaker and could not undergo evaluation with MRI. His NIH stroke score was 6. Patient was not a candidate for TPA because he was out of it time window for treatment at the time he presented to the emergency room.  LSN: Approximately 8 PM on 10/01/2011 tPA Given: No: Beyond time window for treatment MRankin: 2  Past Medical History  Diagnosis Date  . Atrial fibrillation 1990  . Dyslipidemia   . Diabetes mellitus   . Prostate hypertrophy   . Arthritis     degenerative-knees  . Bladder cancer 2010  . 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 History of radiation therapy 05/05/07-06/15/07    right parotid through subclavicular region/ mid jugularlymph node chain  . Allergy   . Hypertension     labile  . Heart murmur     2/6 systolic  . History of chemotherapy      Hx carboplatin/71fu  . Xerostomia     limited    Family History  Problem Relation Age of Onset  . Heart disease Mother   . Cancer Father   . Emphysema Brother   . Coronary artery disease Son      Medications: Prior to Admission:  Aspirin 81 mg per day Lipitor 40 mg per day Plavix 75 mg per day Lanoxin 0.25 mg one half tablet per day Toprol-XL 25 mg per day Multivitamin 1 per day Protonix 40 mg per day Actos 15 mg per day Altace 10 mg per day Ambien 10 mg at bedtime when necessary  Physical  Examination: Blood pressure 140/59, pulse 69, temperature 100.5 F (38.1 C), temperature source Rectal, resp. rate 18, SpO2 98.00%.  Neurologic Examination: Mental Status: Alert, oriented to person and place but not to time.  Marked word finding difficulty was noted along with frequent paraphasic errors. Able to follow commands without difficulty. Cranial Nerves: II-Visual fields testing showed dense right homonymous hemianopsia. III/IV/VI-Pupils were equal and reacted. Extraocular movements were full and conjugate.    V/VII-no facial numbness and no facial weakness. VIII-normal. X-moderately dysarthric speech and symmetrical palatal movement. XII-midline tongue extension Motor: 5/5 bilaterally in upper extremities and slight proximal weakness of both lower extremities which was symmetrical. Muscle tone was flaccid throughout. Sensory: Normal throughout. Deep Tendon Reflexes: Hypoactive and symmetric. Plantars: Flexor bilaterally Cerebellar: Normal finger-to-nose testing. Carotid auscultation: Normal   Dg Chest 1 View  10/02/2011  *RADIOLOGY REPORT*  Clinical Data: Altered mental status  CHEST - 1 VIEW  Comparison:  Chest radiograph 08/24/2010  Findings: Left-sided pacemaker overlies stable cardiac silhouette. Right port in place.  There is increased central venous congestion compared to prior.  No focal consolidation.  No pneumothorax. Degenerative osteophytosis of the thoracic spine.  IMPRESSION: Increase central venous pulmonary congestion.  Mild interstitial edema.  Original Report Authenticated By: Genevive Bi, M.D.   Ct Head Wo Contrast  10/02/2011  *RADIOLOGY REPORT*  Clinical Data: 78 year old male with multiple falls and  altered mental status.  CT HEAD WITHOUT CONTRAST  Technique:  Contiguous axial images were obtained from the base of the skull through the vertex without contrast.  Comparison: 08/22/2011  Findings: A left frontoparietal subdural hematoma is identified,  measuring 6 mm in the frontal region and 4 mm in the posterior parietal region.  There is no evidence of midline shift or mass effect.  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  There is no evidence of hydrocephalus, acute infarction or mass lesion.  There is no evidence of acute fracture. A right mastoid effusion and fluid in the right middle ear again noted.  IMPRESSION: Left frontoparietal subdural hematoma without evidence of mass effect or midline shift.  This measures 6 mm maximally in the frontal region.  Atrophy, chronic small vessel white matter ischemic changes and remote left frontal infarct.  Unchanged right mastoid effusion and fluid in the right middle ear.  Critical Value/emergent results were called by telephone at the time of interpretation on 10/02/2011  at 8:55 p.m.  to  Dr. Radford Pax, who verbally acknowledged these results.  Original Report Authenticated By: Rosendo Gros, M.D.    Assessment: 78 y.o. male presenting with new onset moderately severe expressive aphasia as well as right visual field defect. Although patient has a small left frontal subdural hematoma, it is likely that he has experienced a recurrent acute stroke involving the left middle cerebral artery territory.  Stroke Risk Factors - atrial fibrillation, diabetes mellitus, hyperlipidemia and hypertension  Plan: 1. HgbA1c, fasting lipid panel 2. PT consult, OT consult, Speech consult 3. Echocardiogram 4. CT angiogram of the head and neck with contrast 6. Prophylactic therapy-None (acute subdural hematoma) 7. Risk factor modification 8. Telemetry monitoring  C.R. Roseanne Reno, MD [facility] [phone]  10/02/2011, 9:33 PM

## 2011-10-02 NOTE — ED Notes (Signed)
Patient was found by daughter to be confused with garbled speech. Unknown for how long. Per EMS, stroke exam negative. CBG 109. Patient denies pain and has no complaints. Speech clear at the moment with intermittent confusion. 20g LAC-SL. VSS. Paced rhythm on monitor.

## 2011-10-02 NOTE — ED Notes (Signed)
Dr. Lovell Sheehan, neurosurgery, at bedside.

## 2011-10-02 NOTE — ED Notes (Signed)
One family member let back to see patient; two family members now in room.

## 2011-10-02 NOTE — ED Notes (Signed)
Patient returned from X-ray 

## 2011-10-02 NOTE — H&P (Signed)
PCP: Dr. Guerry Bruin.  ONC:  [facility]: Dr. [name] Cardiology: Dr. Susa Griffins.   Chief Complaint:   Garbled speech  HPI: [name] is a 78 y.o. male   has a past medical history of Atrial fibrillation (1990); Dyslipidemia; Diabetes mellitus; Prostate hypertrophy; Arthritis; Bladder cancer (2010); Cancer (2008); History of radiation therapy (05/05/07-06/15/07); Allergy; Hypertension; Heart murmur; History of chemotherapy; and Xerostomia.   Presented with  Unknown duration of difficult to understand speech. The patient has history of alcohol abuse and was here in emergency department about 2 weeks ago with head injury and laceration sutured. He is taking aspirin and Plavix. He is likely suffered repeated falls due to alcohol and had one yesterday. His daughter this morning and found him with garbled speech. This afternoon he was brought in to emergency department. CT scan of his head showed small left subdural hematoma. He was seen in consult by neurology who recommended admission to medicine. He was also seen by neurosurgery that he was not an operative case but they did feel that he probably should be monitored in a unit. Of note patient has history of TIAs which is likely why he is on Plavix he also has history of atrial fibrillation and in the past was on Coumadin but this has been discontinued his INR within normal limits. Currently no family at bedside and patient is unable to provide any history. History is obtained from the chart.   Review of Systems:  Unable to obtain   Past Medical History: Past Medical History  Diagnosis Date  . Atrial fibrillation 1990  . Dyslipidemia   . Diabetes mellitus   . Prostate hypertrophy   . Arthritis     degenerative-knees  . Bladder cancer 2010  . 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 History of radiation therapy 05/05/07-06/15/07    right parotid through subclavicular region/ mid jugularlymph node chain  . Allergy   .  Hypertension     labile  . Heart murmur     2/6 systolic  . History of chemotherapy      Hx carboplatin/54fu  . Xerostomia     limited   Past Surgical History  Procedure Date  . Pacemaker insertion 1990  . Cystoscopy 01/2011    neg     Medications: Prior to Admission medications   Medication Sig Start Date End Date Taking? Authorizing Provider  aspirin 81 MG tablet Take 81 mg by mouth daily.    Yes Historical Provider, MD  atorvastatin (LIPITOR) 40 MG tablet Take 40 mg by mouth daily.    Yes Historical Provider, MD  clopidogrel (PLAVIX) 75 MG tablet Take 75 mg by mouth daily.    Yes Historical Provider, MD  digoxin (LANOXIN) 0.25 MG tablet Take 125 mcg by mouth daily.   Yes Historical Provider, MD  metoprolol succinate (TOPROL-XL) 25 MG 24 hr tablet Take 25 mg by mouth daily.    Yes Historical Provider, MD  Multiple Vitamin (MULITIVITAMIN WITH MINERALS) TABS Take 1 tablet by mouth daily.   Yes Historical Provider, MD  pantoprazole (PROTONIX) 40 MG tablet Take 40 mg by mouth daily.    Yes Historical Provider, MD  pioglitazone (ACTOS) 15 MG tablet Take 15 mg by mouth daily.   Yes Historical Provider, MD  ramipril (ALTACE) 10 MG tablet Take 10 mg by mouth daily.    Yes Historical Provider, MD  zolpidem (AMBIEN) 10 MG tablet Take 10 mg by mouth at bedtime as needed. For sleep  Yes Historical Provider, MD    Allergies:   Allergies  Allergen Reactions  . Codeine     Blisters between fingers  . Docetaxel Rash    Social History:  Ambulatory independently  Lives at  Home with family    reports that he has never smoked. He does not have any smokeless tobacco history on file. He reports that he drinks about 2 ounces of alcohol per week.   Family History: family history includes Cancer in his father; Coronary artery disease in his son; Emphysema in his brother; and Heart disease in his mother.    Physical Exam: Patient Vitals for the past 24 hrs:  BP Temp Temp src Pulse Resp  SpO2  10/02/11 2140 - 99.1 F (37.3 C) - - - -  10/02/11 2120 140/59 mmHg - - 69  18  98 %  10/02/11 2011 - 100.5 F (38.1 C) Rectal - - -  10/02/11 2002 174/130 mmHg - - 76  - 100 %  10/02/11 1928 140/120 mmHg 99.2 F (37.3 C) Oral 91  20  100 %  10/02/11 1923 - - - - - 98 %    1. General:  in No Acute distress 2. Psychological: Alert but not Oriented 3. Head/ENT:    Dry Mucous Membranes                          Head Non traumatic, neck supple                           Poor Dentition 4. SKIN:decreased Skin turgor,  Skin clean Dry and intact no rash 5. Heart: Regular rate and rhythm with an ejection Murmur, Rub or gallop 6. Lungs: Clear to auscultation bilaterally, no wheezes or crackles   7. Abdomen: Soft, non-tender, Non distended 8. Lower extremities: no clubbing, cyanosis, or edema 9. Neurologically Grossly intact, moving all 4 extremities equally. Strength 5 out of 5 in all 4 extremities. Speech difficult to understand 10. MSK: Normal range of motion  body mass index is unknown because there is no height or weight on file.   Labs on Admission:   [facility] 10/02/11 2044  NA 135  K 3.5  CL 93*  CO2 25  GLUCOSE 125*  BUN 13  CREATININE 0.74  CALCIUM 8.4  MG --  PHOS --    Basename 10/02/11 2044  AST 48*  ALT 21  ALKPHOS 113  BILITOT 2.5*  PROT 6.8  ALBUMIN 3.6   No results found for this basename: LIPASE:2,AMYLASE:2 in the last 72 hours  Basename 10/02/11 2044  WBC 5.5  NEUTROABS 4.7  HGB 13.3  HCT 37.4*  MCV 100.8*  PLT 125*    Basename 10/02/11 2044  CKTOTAL --  CKMB --  CKMBINDEX --  TROPONINI <0.30   No results found for this basename: TSH,T4TOTAL,FREET3,T3FREE,THYROIDAB in the last 72 hours No results found for this basename: VITAMINB12:2,FOLATE:2,FERRITIN:2,TIBC:2,IRON:2,RETICCTPCT:2 in the last 72 hours No results found for this basename: HGBA1C    The CrCl is unknown because both a height and weight (above a minimum accepted value) are  required for this calculation. ABG    Component Value Date/Time   TCO2 27 05/17/2008 0226     No results found for this basename: DDIMER     Other results:  I have pearsonaly reviewed this: ECG REPORT  Rate: 73  Rhythm: Paced  ST&T Change N/A  UA WBC 7-10  Cultures:    Component Value Date/Time   SDES URINE, RANDOM 05/17/2008 0203   SPECREQUEST NONE 05/17/2008 0203   CULT NO GROWTH 05/17/2008 0203   REPTSTATUS 05/18/2008 FINAL 05/17/2008 0203       Radiological Exams on Admission: Dg Chest 1 View  10/02/2011  *RADIOLOGY REPORT*  Clinical Data: Altered mental status  CHEST - 1 VIEW  Comparison:  Chest radiograph 08/24/2010  Findings: Left-sided pacemaker overlies stable cardiac silhouette. Right port in place.  There is increased central venous congestion compared to prior.  No focal consolidation.  No pneumothorax. Degenerative osteophytosis of the thoracic spine.  IMPRESSION: Increase central venous pulmonary congestion.  Mild interstitial edema.  Original Report Authenticated By: Genevive Bi, M.D.   Ct Head Wo Contrast  10/02/2011  *RADIOLOGY REPORT*  Clinical Data: 78 year old male with multiple falls and altered mental status.  CT HEAD WITHOUT CONTRAST  Technique:  Contiguous axial images were obtained from the base of the skull through the vertex without contrast.  Comparison: 08/22/2011  Findings: A left frontoparietal subdural hematoma is identified, measuring 6 mm in the frontal region and 4 mm in the posterior parietal region.  There is no evidence of midline shift or mass effect.  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  There is no evidence of hydrocephalus, acute infarction or mass lesion.  There is no evidence of acute fracture. A right mastoid effusion and fluid in the right middle ear again noted.  IMPRESSION: Left frontoparietal subdural hematoma without evidence of mass effect or midline  shift.  This measures 6 mm maximally in the frontal region.  Atrophy, chronic small vessel white matter ischemic changes and remote left frontal infarct.  Unchanged right mastoid effusion and fluid in the right middle ear.  Critical Value/emergent results were called by telephone at the time of interpretation on 10/02/2011  at 8:55 p.m.  to  Dr. Radford Pax, who verbally acknowledged these results.  Original Report Authenticated By: Rosendo Gros, M.D.    Assessment/Plan   78 year old gentleman small subdural hematoma in the setting of repeated falls due to alcohol use currently on aspirin and Plavix for history of TIAs   Present on Admission:  .Subdural hematoma - will watch carefully and step down with frequent neuro checks. Stop aspirin and Plavix. During her surgery patient is not a operative candidate secondary to small lesion is any evidence of midline shift  .Diabetes mellitus - hold by mouth medications put on sliding scale insulin  .Hypertension - continue home medication   .ETOH abuse - watch for signs of withdrawal initiate CIWA  Protocol, patient per family drink very large amount of EtOH per day. Will write for SW consult.  .Atrial fibrillation  - not a Coumadin candidate currently appears to be paced  .UTI (lower urinary tract infection) - will treat with Rocephin await results of urine culture  .Speech abnormality - speech pathology evaluation    Prophylaxis: SCD  Protonix  CODE STATUS: Family is not decided that at this point did state that if he required intubation to "go for it" for now patient is to be full code patient family will discuss this further   I have spent a total of 65 min on this admission, time spent with the family discussion   Lowen Barringer 10/02/2011, 10:56 PM

## 2011-10-02 NOTE — Consult Note (Signed)
Reason for Consult: Subdural hematoma, aphasia Referring Physician: The ER doctor  [name] is an 78 y.o. male.  HPI: The patient is a 78 year old alcoholic white male who has a history of TIAs. The patient lives with his handicapped daughter and evidently the patient was in his usual state of health yesterday. The patient's daughter when out for the evening. She returned this evening and found the patient to be dysphasic. The patient was brought to [facility] the EMS was evaluated by the emergency room physician. Evaluation included a head scan which demonstrated a small left subdural hematoma. A neurosurgical consultation was requested.  Presently the patient is accompanied by his son and daughter. He is alert but has a motor aphasia. There is no history of seizures. The patient takes frequent falls and less fell yesterday. He is on Plavix for his TIAs and cardiac history. The patient denies neck pain and back pain. According to the patient's daughter the patient is a very heavy consumer of alcohol.  Past Medical History  Diagnosis Date  . Atrial fibrillation 1990  . Dyslipidemia   . Diabetes mellitus   . Prostate hypertrophy   . Arthritis     degenerative-knees  . Bladder cancer 2010  . 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 History of radiation therapy 05/05/07-06/15/07    right parotid through subclavicular region/ mid jugularlymph node chain  . Allergy   . Hypertension     labile  . Heart murmur     2/6 systolic  . History of chemotherapy      Hx carboplatin/81fu  . Xerostomia     limited    Past Surgical History  Procedure Date  . Pacemaker insertion 1990  . Cystoscopy 01/2011    neg    Family History  Problem Relation Age of Onset  . Heart disease Mother   . Cancer Father   . Emphysema Brother   . Coronary artery disease Son     Social History:  reports that he has never smoked. He does not have any smokeless tobacco history on file. He reports that he  drinks about 2 ounces of alcohol per week. His drug history not on file.  Allergies:  Allergies  Allergen Reactions  . Codeine     Blisters between fingers  . Docetaxel Rash    Medications:  Prior to Admission:  (Not in a hospital admission) Scheduled:   Continuous:   PRN:   Anti-infectives    None      Results for orders placed during the hospital encounter of 10/02/11 (from the past 48 hour(s))  URINALYSIS, ROUTINE W REFLEX MICROSCOPIC     Status: Abnormal   Collection Time   10/02/11  8:07 PM      Component Value Range Comment   Color, Urine AMBER (*) YELLOW  BIOCHEMICALS MAY BE AFFECTED BY COLOR   APPearance CLEAR  CLEAR     Specific Gravity, Urine 1.020  1.005 - 1.030     pH 7.0  5.0 - 8.0     Glucose, UA NEGATIVE  NEGATIVE (mg/dL)    Hgb urine dipstick NEGATIVE  NEGATIVE     Bilirubin Urine MODERATE (*) NEGATIVE     Ketones, ur 40 (*) NEGATIVE (mg/dL)    Protein, ur 30 (*) NEGATIVE (mg/dL)    Urobilinogen, UA >1.6 (*) 0.0 - 1.0 (mg/dL)    Nitrite POSITIVE (*) NEGATIVE     Leukocytes, UA SMALL (*) NEGATIVE    URINE  RAPID DRUG SCREEN (HOSP PERFORMED)     Status: Abnormal   Collection Time   10/02/11  8:07 PM      Component Value Range Comment   Opiates POSITIVE (*) NONE DETECTED     Cocaine NONE DETECTED  NONE DETECTED     Benzodiazepines NONE DETECTED  NONE DETECTED     Amphetamines NONE DETECTED  NONE DETECTED     Tetrahydrocannabinol NONE DETECTED  NONE DETECTED     Barbiturates NONE DETECTED  NONE DETECTED    URINE MICROSCOPIC-ADD ON     Status: Abnormal   Collection Time   10/02/11  8:07 PM      Component Value Range Comment   Squamous Epithelial / LPF RARE  RARE     WBC, UA 7-10  <3 (WBC/hpf)    RBC / HPF 0-2  <3 (RBC/hpf)    Bacteria, UA MANY (*) RARE    GLUCOSE, CAPILLARY     Status: Abnormal   Collection Time   10/02/11  8:14 PM      Component Value Range Comment   Glucose-Capillary 128 (*) 70 - 99 (mg/dL)   CBC     Status: Abnormal    Collection Time   10/02/11  8:44 PM      Component Value Range Comment   WBC 5.5  4.0 - 10.5 (K/uL)    RBC 3.71 (*) 4.22 - 5.81 (MIL/uL)    Hemoglobin 13.3  13.0 - 17.0 (g/dL)    HCT 19.1 (*) 47.8 - 52.0 (%)    MCV 100.8 (*) 78.0 - 100.0 (fL)    MCH 35.8 (*) 26.0 - 34.0 (pg)    MCHC 35.6  30.0 - 36.0 (g/dL)    RDW 29.5  62.1 - 30.8 (%)    Platelets 125 (*) 150 - 400 (K/uL)   DIFFERENTIAL     Status: Abnormal   Collection Time   10/02/11  8:44 PM      Component Value Range Comment   Neutrophils Relative 85 (*) 43 - 77 (%)    Neutro Abs 4.7  1.7 - 7.7 (K/uL)    Lymphocytes Relative 10 (*) 12 - 46 (%)    Lymphs Abs 0.6 (*) 0.7 - 4.0 (K/uL)    Monocytes Relative 5  3 - 12 (%)    Monocytes Absolute 0.3  0.1 - 1.0 (K/uL)    Eosinophils Relative 0  0 - 5 (%)    Eosinophils Absolute 0.0  0.0 - 0.7 (K/uL)    Basophils Relative 0  0 - 1 (%)    Basophils Absolute 0.0  0.0 - 0.1 (K/uL)   COMPREHENSIVE METABOLIC PANEL     Status: Abnormal   Collection Time   10/02/11  8:44 PM      Component Value Range Comment   Sodium 135  135 - 145 (mEq/L)    Potassium 3.5  3.5 - 5.1 (mEq/L)    Chloride 93 (*) 96 - 112 (mEq/L)    CO2 25  19 - 32 (mEq/L)    Glucose, Bld 125 (*) 70 - 99 (mg/dL)    BUN 13  6 - 23 (mg/dL)    Creatinine, Ser 6.57  0.50 - 1.35 (mg/dL)    Calcium 8.4  8.4 - 10.5 (mg/dL)    Total Protein 6.8  6.0 - 8.3 (g/dL)    Albumin 3.6  3.5 - 5.2 (g/dL)    AST 48 (*) 0 - 37 (U/L)    ALT 21  0 - 53 (  U/L)    Alkaline Phosphatase 113  39 - 117 (U/L)    Total Bilirubin 2.5 (*) 0.3 - 1.2 (mg/dL)    GFR calc non Af Amer 85 (*) >90 (mL/min)    GFR calc Af Amer >90  >90 (mL/min)   PROTIME-INR     Status: Normal   Collection Time   10/02/11  8:44 PM      Component Value Range Comment   Prothrombin Time 14.7  11.6 - 15.2 (seconds)    INR 1.13  0.00 - 1.49    ETHANOL     Status: Normal   Collection Time   10/02/11  8:44 PM      Component Value Range Comment   Alcohol, Ethyl (B) <11  0 - 11  (mg/dL)   SALICYLATE LEVEL     Status: Abnormal   Collection Time   10/02/11  8:44 PM      Component Value Range Comment   Salicylate Lvl <2.0 (*) 2.8 - 20.0 (mg/dL)   ACETAMINOPHEN LEVEL     Status: Normal   Collection Time   10/02/11  8:44 PM      Component Value Range Comment   Acetaminophen (Tylenol), Serum <15.0  10 - 30 (ug/mL)   TROPONIN I     Status: Normal   Collection Time   10/02/11  8:44 PM      Component Value Range Comment   Troponin I <0.30  <0.30 (ng/mL)     Dg Chest 1 View  10/02/2011  *RADIOLOGY REPORT*  Clinical Data: Altered mental status  CHEST - 1 VIEW  Comparison:  Chest radiograph 08/24/2010  Findings: Left-sided pacemaker overlies stable cardiac silhouette. Right port in place.  There is increased central venous congestion compared to prior.  No focal consolidation.  No pneumothorax. Degenerative osteophytosis of the thoracic spine.  IMPRESSION: Increase central venous pulmonary congestion.  Mild interstitial edema.  Original Report Authenticated By: Genevive Bi, M.D.   Ct Head Wo Contrast  10/02/2011  *RADIOLOGY REPORT*  Clinical Data: 78 year old male with multiple falls and altered mental status.  CT HEAD WITHOUT CONTRAST  Technique:  Contiguous axial images were obtained from the base of the skull through the vertex without contrast.  Comparison: 08/22/2011  Findings: A left frontoparietal subdural hematoma is identified, measuring 6 mm in the frontal region and 4 mm in the posterior parietal region.  There is no evidence of midline shift or mass effect.  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  There is no evidence of hydrocephalus, acute infarction or mass lesion.  There is no evidence of acute fracture. A right mastoid effusion and fluid in the right middle ear again noted.  IMPRESSION: Left frontoparietal subdural hematoma without evidence of mass effect or midline shift.  This measures 6  mm maximally in the frontal region.  Atrophy, chronic small vessel white matter ischemic changes and remote left frontal infarct.  Unchanged right mastoid effusion and fluid in the right middle ear.  Critical Value/emergent results were called by telephone at the time of interpretation on 10/02/2011  at 8:55 p.m.  to  Dr. Radford Pax, who verbally acknowledged these results.  Original Report Authenticated By: Rosendo Gros, M.D.    Review of systems is presently unobtainable. However the patient is able to answer some simple questions reliably with yes or no.  Blood pressure 140/59, pulse 69, temperature 99.1 F (37.3 C), temperature source Rectal, resp. rate 18,  SpO2 98.00%. Physical exam:  Gen.: An alert 78 year old white male in no apparent stress without obvious signs of trauma. He does have diffuse ecchymosis particularly in his arms.  Neck: Supple without masses or deformities he has limited cervical range of motion which is age-appropriate.  Thorax: Symmetric  Heart: Irregular rhythm  Abdomen: Soft  Extremities: No obvious deformities  Back exam: Unremarkable  Neurologic exam: The patient is alert but has a motor aphasia. Glasgow Coma Scale 12([id number]) cranial nerves II through XII were examined bilaterally and grossly normal. The patient's motor strength is 5 over 5 his bilateral hand grip bicep tricep gastrocnemius and dorsiflexors . Sensory functions intact to light touch sensation all tested dermatomes bilaterally. Cerebellar functions intact to rapid alternating movements of the upper extremities bilaterally. Deep tendon reflexes are diminished but symmetric  Imaging studies: I reviewed the patient's head CT performed without contrast at [facility] tonight. It demonstrates the patient has bilateral subdural hygromas. He has a small left frontal and occipital acute subdural hematoma. There is no significant mass effect. He said and old left frontal  stroke.  Assessment/Plan: Small left subdural hematoma: This is not creating any significant mass effect. I recommend ICU observation given the fact that the patient is on Plavix. I will plan to repeat his CAT scan tomorrow. This hematoma is unlikely to require surgery. Obviously we are going to need to stop the anticoagulation/Plavix. He is going to be admitted by the medical service given his medical issues.  Aphasia: This could be secondary to a seizure/Todd's paralysis. Alternatively, the patient may have had another TIA. I've spoken to the ER doctor and recommend that neurology be consult. Unfortunately we can't get an MRI scan of his brain because he has a pacemaker.  Alcoholism: I discussed this with the patient and his children. I recommended the patient quit drinking alcohol. He will need DT prophylaxis.    Janmarie Smoot D 10/02/2011, 10:12 PM

## 2011-10-03 ENCOUNTER — Inpatient Hospital Stay (HOSPITAL_COMMUNITY): Payer: Medicare Other

## 2011-10-03 DIAGNOSIS — G459 Transient cerebral ischemic attack, unspecified: Secondary | ICD-10-CM | POA: Diagnosis present

## 2011-10-03 LAB — LIPID PANEL
HDL: 55 mg/dL (ref >39)
LDL Cholesterol: 47 mg/dL (ref 0–99)
Total CHOL/HDL Ratio: 2.1 RATIO
VLDL: 13 mg/dL (ref 0–40)

## 2011-10-03 LAB — GLUCOSE, CAPILLARY
Glucose-Capillary: 91 mg/dL (ref 70–99)
Glucose-Capillary: 104 mg/dL — ABNORMAL HIGH (ref 70–99)

## 2011-10-03 LAB — CARDIAC PANEL(CRET KIN+CKTOT+MB+TROPI)
Relative Index: 0.6 (ref 0.0–2.5)
Total CK: 469 U/L — ABNORMAL HIGH (ref 7–232)

## 2011-10-03 LAB — TSH: TSH: 2.075 u[IU]/mL (ref 0.350–4.500)

## 2011-10-03 LAB — HEMOGLOBIN A1C
Hgb A1c MFr Bld: 5.6 % (ref <5.7)
Mean Plasma Glucose: 114 mg/dL (ref <117)

## 2011-10-03 MED ORDER — ACETAMINOPHEN 325 MG PO TABS
650.0000 mg | ORAL_TABLET | ORAL | Status: DC | PRN
  Administered 2011-10-06 – 2011-10-09 (×6): 650 mg via ORAL
  Filled 2011-10-03 (×6): qty 2

## 2011-10-03 MED ORDER — RAMIPRIL 10 MG PO CAPS
10.0000 mg | ORAL_CAPSULE | Freq: Every day | ORAL | Status: DC
  Administered 2011-10-03: 10 mg via ORAL
  Filled 2011-10-03 (×2): qty 1

## 2011-10-03 MED ORDER — VITAMIN B-1 100 MG PO TABS
100.0000 mg | ORAL_TABLET | Freq: Every day | ORAL | Status: DC
  Administered 2011-10-03: 100 mg via ORAL
  Filled 2011-10-03 (×2): qty 1

## 2011-10-03 MED ORDER — DEXTROSE 5 % IV SOLN
1.0000 g | INTRAVENOUS | Status: DC
  Administered 2011-10-03: 1 g via INTRAVENOUS
  Filled 2011-10-03 (×2): qty 10

## 2011-10-03 MED ORDER — DEXTROSE 5 % IV SOLN
1.0000 g | INTRAVENOUS | Status: DC
  Filled 2011-10-03: qty 10

## 2011-10-03 MED ORDER — LORAZEPAM 1 MG PO TABS
1.0000 mg | ORAL_TABLET | Freq: Four times a day (QID) | ORAL | Status: DC | PRN
  Administered 2011-10-03: 1 mg via ORAL
  Filled 2011-10-03: qty 1

## 2011-10-03 MED ORDER — METOPROLOL SUCCINATE ER 25 MG PO TB24
25.0000 mg | ORAL_TABLET | Freq: Every day | ORAL | Status: DC
  Administered 2011-10-03 – 2011-10-04 (×2): 25 mg via ORAL
  Filled 2011-10-03 (×2): qty 1

## 2011-10-03 MED ORDER — ADULT MULTIVITAMIN W/MINERALS CH
1.0000 | ORAL_TABLET | Freq: Every day | ORAL | Status: DC
  Administered 2011-10-03 – 2011-10-09 (×6): 1 via ORAL
  Filled 2011-10-03 (×7): qty 1

## 2011-10-03 MED ORDER — ATORVASTATIN CALCIUM 40 MG PO TABS
40.0000 mg | ORAL_TABLET | Freq: Every day | ORAL | Status: DC
  Administered 2011-10-03 – 2011-10-09 (×6): 40 mg via ORAL
  Filled 2011-10-03 (×7): qty 1

## 2011-10-03 MED ORDER — LORAZEPAM 2 MG/ML IJ SOLN
1.0000 mg | Freq: Four times a day (QID) | INTRAMUSCULAR | Status: DC | PRN
  Filled 2011-10-03: qty 1

## 2011-10-03 MED ORDER — PANTOPRAZOLE SODIUM 40 MG PO TBEC
40.0000 mg | DELAYED_RELEASE_TABLET | Freq: Every day | ORAL | Status: DC
  Administered 2011-10-03 – 2011-10-09 (×6): 40 mg via ORAL
  Filled 2011-10-03 (×6): qty 1

## 2011-10-03 MED ORDER — INSULIN ASPART 100 UNIT/ML ~~LOC~~ SOLN
0.0000 [IU] | Freq: Three times a day (TID) | SUBCUTANEOUS | Status: DC
  Administered 2011-10-04: 1 [IU] via SUBCUTANEOUS

## 2011-10-03 MED ORDER — INSULIN ASPART 100 UNIT/ML ~~LOC~~ SOLN: 0.0000 [IU] | SUBCUTANEOUS | Status: DC

## 2011-10-03 MED ORDER — LORAZEPAM 2 MG/ML IJ SOLN
0.0000 mg | Freq: Four times a day (QID) | INTRAMUSCULAR | Status: DC
  Administered 2011-10-03 (×4): 1 mg via INTRAVENOUS
  Administered 2011-10-04: 4 mg via INTRAVENOUS
  Filled 2011-10-03 (×3): qty 1
  Filled 2011-10-03: qty 2

## 2011-10-03 MED ORDER — DIGOXIN 125 MCG PO TABS
125.0000 ug | ORAL_TABLET | Freq: Every day | ORAL | Status: DC
  Administered 2011-10-03 – 2011-10-09 (×7): 125 ug via ORAL
  Filled 2011-10-03 (×7): qty 1

## 2011-10-03 MED ORDER — LORAZEPAM 2 MG/ML IJ SOLN
0.0000 mg | Freq: Two times a day (BID) | INTRAMUSCULAR | Status: DC
  Filled 2011-10-03 (×2): qty 1

## 2011-10-03 MED ORDER — THIAMINE HCL 100 MG/ML IJ SOLN
100.0000 mg | Freq: Every day | INTRAMUSCULAR | Status: DC
Start: 2011-10-03 — End: 2011-10-03
  Filled 2011-10-03: qty 1

## 2011-10-03 MED ORDER — FOLIC ACID 1 MG PO TABS
1.0000 mg | ORAL_TABLET | Freq: Every day | ORAL | Status: DC
  Administered 2011-10-03: 1 mg via ORAL
  Filled 2011-10-03 (×2): qty 1

## 2011-10-03 MED ORDER — SODIUM CHLORIDE 0.9 % IV SOLN
INTRAVENOUS | Status: AC
  Administered 2011-10-03: 01:00:00 via INTRAVENOUS

## 2011-10-03 NOTE — Progress Notes (Signed)
[facility]  Subjective: 78 y/o who presented to the ER for slurred speech which has resolved. He has a h/o frequent falls and was in the ER 2 wks ago with a head injury and was found incidentally on CT head to have a subdural hematoma.  We are working up a TIA. Neuro is following.   The patient's speech is back to normal. He has no complaints. He admits to having 3-4 drinks of [place] daily  Objective: Blood pressure 125/62, pulse 70, temperature 99.3 F (37.4 C), temperature source Oral, resp. rate 20, height 6\' 1"  (1.854 m), weight 80.5 kg (177 lb 7.5 oz), SpO2 92.00%. Weight change:   Intake/Output Summary (Last 24 hours) at 10/03/11 1607 Last data filed at 10/03/11 1030  Gross per 24 hour  Intake    525 ml  Output    525 ml  Net      0 ml    Physical Exam: General appearance: alert and cooperative Throat: lips, mucosa, and tongue normal Lungs: clear to auscultation bilaterally Heart: regular rate and rhythm, S1, S2 normal, no murmur, click, rub or gallop Abdomen: soft, non-tender; bowel sounds normal; no masses,  no organomegaly Extremities: extremities normal, atraumatic, no cyanosis or edema Neurologic: Grossly normal  Lab Results:  [facility] 10/02/11 2044  NA 135  K 3.5  CL 93*  CO2 25  GLUCOSE 125*  BUN 13  CREATININE 0.74  CALCIUM 8.4  MG --  PHOS --    Basename 10/02/11 2044  AST 48*  ALT 21  ALKPHOS 113  BILITOT 2.5*  PROT 6.8  ALBUMIN 3.6   No results found for this basename: LIPASE:2,AMYLASE:2 in the last 72 hours  Basename 10/02/11 2044  WBC 5.5  NEUTROABS 4.7  HGB 13.3  HCT 37.4*  MCV 100.8*  PLT 125*    Basename 10/03/11 0031 10/02/11 2044  CKTOTAL 469* --  CKMB 3.0 --  CKMBINDEX -- --  TROPONINI <0.30 <0.30   No components found with this basename: POCBNP:3 No results found for this basename: DDIMER:2 in the last 72 hours  Basename 10/03/11 0031  HGBA1C 5.6    Basename 10/03/11 0600  CHOL 115  HDL 55  LDLCALC 47    TRIG 63  CHOLHDL 2.1  LDLDIRECT --    Basename 10/02/11 2044  TSH 2.075  T4TOTAL --  T3FREE --  THYROIDAB --   No results found for this basename: VITAMINB12:2,FOLATE:2,FERRITIN:2,TIBC:2,IRON:2,RETICCTPCT:2 in the last 72 hours  Micro Results: No results found for this or any previous visit (from the past 240 hour(s)).  Studies/Results: Dg Chest 1 View  10/02/2011  *RADIOLOGY REPORT*  Clinical Data: Altered mental status  CHEST - 1 VIEW  Comparison:  Chest radiograph 08/24/2010  Findings: Left-sided pacemaker overlies stable cardiac silhouette. Right port in place.  There is increased central venous congestion compared to prior.  No focal consolidation.  No pneumothorax. Degenerative osteophytosis of the thoracic spine.  IMPRESSION: Increase central venous pulmonary congestion.  Mild interstitial edema.  Original Report Authenticated By: Genevive Bi, M.D.   Ct Head Wo Contrast  10/03/2011  *RADIOLOGY REPORT*  Clinical Data: Follow-up subdural hematoma.  CT HEAD WITHOUT CONTRAST  Technique:  Contiguous axial images were obtained from the base of the skull through the vertex without contrast.  Comparison: 10/02/2011  Findings: Again noted is a left frontal subdural hematoma and a left parietal-occipital subdural hematoma.  The size and distribution of the hematomas have not significantly changed. There is probably a small amount of blood  along the left posterior falx which is unchanged.  Again noted is cerebral atrophy and evidence for encephalomalacia in the lower left frontal lobe. There is no significant midline shift or hydrocephalus.  Stable opacification and probably fluid involving the right mastoid air cells.  There is mild mucosal disease in the left ethmoid air cells.  Mild mucosal disease in the left maxillary sinus.  No acute bony abnormality.  IMPRESSION: Stable size and appearance of the left subdural hematomas.  No significant mass effect or hydrocephalus.  Stable  opacification of the right mastoid air cells as described.  Cerebral atrophy and evidence for encephalomalacia in the left frontal lobe.  Original Report Authenticated By: Richarda Overlie, M.D.   Ct Head Wo Contrast  10/02/2011  *RADIOLOGY REPORT*  Clinical Data: 78 year old male with multiple falls and altered mental status.  CT HEAD WITHOUT CONTRAST  Technique:  Contiguous axial images were obtained from the base of the skull through the vertex without contrast.  Comparison: 08/22/2011  Findings: A left frontoparietal subdural hematoma is identified, measuring 6 mm in the frontal region and 4 mm in the posterior parietal region.  There is no evidence of midline shift or mass effect.  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  There is no evidence of hydrocephalus, acute infarction or mass lesion.  There is no evidence of acute fracture. A right mastoid effusion and fluid in the right middle ear again noted.  IMPRESSION: Left frontoparietal subdural hematoma without evidence of mass effect or midline shift.  This measures 6 mm maximally in the frontal region.  Atrophy, chronic small vessel white matter ischemic changes and remote left frontal infarct.  Unchanged right mastoid effusion and fluid in the right middle ear.  Critical Value/emergent results were called by telephone at the time of interpretation on 10/02/2011  at 8:55 p.m.  to  Dr. Radford Pax, who verbally acknowledged these results.  Original Report Authenticated By: Rosendo Gros, M.D.    Medications: Scheduled Meds:    . sodium chloride   Intravenous Once  . atorvastatin  40 mg Oral Daily  . cefTRIAXone (ROCEPHIN)  IV  1 g Intravenous Once  . cefTRIAXone (ROCEPHIN) IVPB 1 gram/50 mL D5W  1 g Intravenous Q24H  . digoxin  125 mcg Oral Daily  . folic acid  1 mg Oral Daily  . insulin aspart  0-9 Units Subcutaneous TID WC  . LORazepam  0-4 mg Intravenous Q6H   Followed by  .  LORazepam  0-4 mg Intravenous Q12H  . LORazepam  1 mg Intravenous Once  . metoprolol succinate  25 mg Oral Daily  . mulitivitamin with minerals  1 tablet Oral Daily  . pantoprazole  40 mg Oral Daily  . ramipril  10 mg Oral Daily  . sodium chloride  1,000 mL Intravenous Once  . thiamine  100 mg Oral Daily  . DISCONTD: cefTRIAXone (ROCEPHIN) IVPB 1 gram/50 mL D5W  1 g Intravenous Q24H  . DISCONTD: insulin aspart  0-9 Units Subcutaneous Q4H  . DISCONTD: thiamine  100 mg Intravenous Daily   Continuous Infusions:    . sodium chloride 75 mL/hr at 10/03/11 0700   PRN Meds:.acetaminophen, LORazepam, LORazepam  Assessment/Plan: Principal Problem:  *TIA (transient ischemic attack)/ CVA? - unfortunately due to subdural hematoma and falls, Plavix has been stopped.  - PT/OT-   Subdural hematoma NS has evaluated- f/u CT is stable. NS signed off.    UTI (lower  urinary tract infection) Rocephin - f/u culture  Frequent falls May need SNF - f/u PT eval    Atrial fibrillation/Pacemaker V- paced   Diabetes mellitus Sugars stable, on sliding scale   Hypertension BP stable   ETOH abuse ETOH level was low on admission; on CIWA for withdrawl    LOS: 1 day   [facility] [phone] 10/03/2011, 4:07 PM

## 2011-10-03 NOTE — Progress Notes (Signed)
Patient ID: [name], male   DOB: [birth year], 78 y.o.   MRN: [id number] Subjective:  The patient is alert and pleasant. His speech is much better today.  Objective: Vital signs in last 24 hours: Temp:  [98.6 F (37 C)-100.5 F (38.1 C)] 99.3 F (37.4 C) (04/13 0730) Pulse Rate:  [69-91] 69  (04/13 0730) Resp:  [16-20] 20  (04/13 0400) BP: (129-174)/(59-130) 154/62 mmHg (04/13 0730) SpO2:  [92 %-100 %] 92 % (04/13 0400) Weight:  [80.5 kg (177 lb 7.5 oz)] 80.5 kg (177 lb 7.5 oz) (04/12 2320)  Intake/Output from previous day: 04/12 0701 - 04/13 0700 In: 525 [I.V.:525] Out: 350 [Urine:350] Intake/Output this shift:    Physical exam the patient is alert and oriented x3. Glasgow Coma Scale 15. He is moving all 4 extremities well. His speech is nearly normal.  Lab Results:  [facility] 10/02/11 2044  WBC 5.5  HGB 13.3  HCT 37.4*  PLT 125*   BMET  Basename 10/02/11 2044  NA 135  K 3.5  CL 93*  CO2 25  GLUCOSE 125*  BUN 13  CREATININE 0.74  CALCIUM 8.4    Studies/Results: Dg Chest 1 View  10/02/2011  *RADIOLOGY REPORT*  Clinical Data: Altered mental status  CHEST - 1 VIEW  Comparison:  Chest radiograph 08/24/2010  Findings: Left-sided pacemaker overlies stable cardiac silhouette. Right port in place.  There is increased central venous congestion compared to prior.  No focal consolidation.  No pneumothorax. Degenerative osteophytosis of the thoracic spine.  IMPRESSION: Increase central venous pulmonary congestion.  Mild interstitial edema.  Original Report Authenticated By: Genevive Bi, M.D.   Ct Head Wo Contrast  10/03/2011  *RADIOLOGY REPORT*  Clinical Data: Follow-up subdural hematoma.  CT HEAD WITHOUT CONTRAST  Technique:  Contiguous axial images were obtained from the base of the skull through the vertex without contrast.  Comparison: 10/02/2011  Findings: Again noted is a left frontal subdural hematoma and a left parietal-occipital subdural hematoma.  The size  and distribution of the hematomas have not significantly changed. There is probably a small amount of blood along the left posterior falx which is unchanged.  Again noted is cerebral atrophy and evidence for encephalomalacia in the lower left frontal lobe. There is no significant midline shift or hydrocephalus.  Stable opacification and probably fluid involving the right mastoid air cells.  There is mild mucosal disease in the left ethmoid air cells.  Mild mucosal disease in the left maxillary sinus.  No acute bony abnormality.  IMPRESSION: Stable size and appearance of the left subdural hematomas.  No significant mass effect or hydrocephalus.  Stable opacification of the right mastoid air cells as described.  Cerebral atrophy and evidence for encephalomalacia in the left frontal lobe.  Original Report Authenticated By: Richarda Overlie, M.D.   Ct Head Wo Contrast  10/02/2011  *RADIOLOGY REPORT*  Clinical Data: 78 year old male with multiple falls and altered mental status.  CT HEAD WITHOUT CONTRAST  Technique:  Contiguous axial images were obtained from the base of the skull through the vertex without contrast.  Comparison: 08/22/2011  Findings: A left frontoparietal subdural hematoma is identified, measuring 6 mm in the frontal region and 4 mm in the posterior parietal region.  There is no evidence of midline shift or mass effect.  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  There is no evidence of hydrocephalus, acute infarction or mass lesion.  There is no evidence  of acute fracture. A right mastoid effusion and fluid in the right middle ear again noted.  IMPRESSION: Left frontoparietal subdural hematoma without evidence of mass effect or midline shift.  This measures 6 mm maximally in the frontal region.  Atrophy, chronic small vessel white matter ischemic changes and remote left frontal infarct.  Unchanged right mastoid effusion and fluid in the  right middle ear.  Critical Value/emergent results were called by telephone at the time of interpretation on 10/02/2011  at 8:55 p.m.  to  Dr. Radford Pax, who verbally acknowledged these results.  Original Report Authenticated By: Rosendo Gros, M.D.    Assessment/Plan: Left small subdural hematomas. The patient's followup scan is stable today. There is no significant mass effect or additional bleeding. He will not need surgery. I'll sign off. Please call if I can help further.  Dysphasia: The patient's speech is much better today. His speech difficulty may have come from a  seizure versus a TIA. I recommend the neurologist see the patient.  LOS: 1 day     Dorleen Kissel D 10/03/2011, 9:24 AM

## 2011-10-03 NOTE — Progress Notes (Signed)
Patient ID: [name], male   DOB: March 13, 1930, 78 y.o.   MRN: [id number] Stroke Team Progress Note  HISTORY [name] is an 78 y.o. male history of alcohol abuse, atrial fibrillation on Coumadin diabetes mellitus, hypertension, hyperlipidemia and bladder cancer, presenting with new onset marked speech difficulty with nonsensical speech output with frequent errors. Patient has not demonstrated any difficulty with understanding what was being said to him. His previous history of left frontal stroke. CT scan of his head showed a 6 mm left frontal subdural hematoma without mass effect. Patient has a pacemaker and could not undergo evaluation with MRI. His NIH stroke score was 6. Patient was not a candidate for TPA because he was out of it time window for treatment at the time he presented to the emergency room.  SUBJECTIVE Patient resting in his room this am. Repeat CT head today demonstrates stable appearance of his SDH. Speech is improved but not back to baseline.  OBJECTIVE Most recent Vital Signs: Temp: 99.3 F (37.4 C) (04/13 0730) Temp src: Oral (04/13 0730) BP: 154/62 mmHg (04/13 0730) Pulse Rate: 69  (04/13 0730) Respiratory Rate: 20 O2 Saturation: 92%  CBG (last 3)  Basename 10/03/11 0815 10/03/11 0446 10/03/11 0030  GLUCAP 91 84 107*   Intake/Output from previous day: 04/12 0701 - 04/13 0700 In: 525 [I.V.:525] Out: 350 [Urine:350]  IV Fluid Intake:     . sodium chloride 75 mL/hr at 10/03/11 0040   Medications    . sodium chloride   Intravenous Once  . atorvastatin  40 mg Oral Daily  . cefTRIAXone (ROCEPHIN)  IV  1 g Intravenous Once  . cefTRIAXone (ROCEPHIN) IVPB 1 gram/50 mL D5W  1 g Intravenous Q24H  . digoxin  125 mcg Oral Daily  . folic acid  1 mg Oral Daily  . insulin aspart  0-9 Units Subcutaneous Q4H  . LORazepam  0-4 mg Intravenous Q6H   Followed by  . LORazepam  0-4 mg Intravenous Q12H  . LORazepam  1 mg Intravenous Once  . metoprolol succinate   25 mg Oral Daily  . mulitivitamin with minerals  1 tablet Oral Daily  . pantoprazole  40 mg Oral Daily  . ramipril  10 mg Oral Daily  . sodium chloride  1,000 mL Intravenous Once  . thiamine  100 mg Oral Daily   Or  . thiamine  100 mg Intravenous Daily  . DISCONTD: cefTRIAXone (ROCEPHIN) IVPB 1 gram/50 mL D5W  1 g Intravenous Q24H  PRN acetaminophen, LORazepam, LORazepam  Diet:    NPO liquids Activity:  Bathroom privileges; Up with assistance; DVT Prophylaxis:  SCDs due to SDH  Studies: CBC    Component Value Date/Time   WBC 5.5 10/02/2011 2044   WBC 5.9 05/19/2011 1548   RBC 3.71* 10/02/2011 2044   RBC 4.17* 05/19/2011 1548   HGB 13.3 10/02/2011 2044   HGB 14.5 05/19/2011 1548   HCT 37.4* 10/02/2011 2044   HCT 41.2 05/19/2011 1548   PLT 125* 10/02/2011 2044   PLT 180 05/19/2011 1548   MCV 100.8* 10/02/2011 2044   MCV 98.8* 05/19/2011 1548   MCH 35.8* 10/02/2011 2044   MCH 34.8* 05/19/2011 1548   MCHC 35.6 10/02/2011 2044   MCHC 35.2 05/19/2011 1548   RDW 13.4 10/02/2011 2044   RDW 14.3 05/19/2011 1548   LYMPHSABS 0.6* 10/02/2011 2044   LYMPHSABS 1.7 05/19/2011 1548   MONOABS 0.3 10/02/2011 2044   MONOABS 0.6 05/19/2011 1548   EOSABS  0.0 10/02/2011 2044   EOSABS 0.1 05/19/2011 1548   BASOSABS 0.0 10/02/2011 2044   BASOSABS 0.0 05/19/2011 1548   CMP    Component Value Date/Time   NA 135 10/02/2011 2044   K 3.5 10/02/2011 2044   CL 93* 10/02/2011 2044   CO2 25 10/02/2011 2044   GLUCOSE 125* 10/02/2011 2044   BUN 13 10/02/2011 2044   CREATININE 0.74 10/02/2011 2044   CALCIUM 8.4 10/02/2011 2044   PROT 6.8 10/02/2011 2044   ALBUMIN 3.6 10/02/2011 2044   AST 48* 10/02/2011 2044   ALT 21 10/02/2011 2044   ALKPHOS 113 10/02/2011 2044   BILITOT 2.5* 10/02/2011 2044   GFRNONAA 85* 10/02/2011 2044   GFRAA >90 10/02/2011 2044   COAGS Lab Results  Component Value Date   INR 1.13 10/02/2011   INR 1.1 01/11/2007   Lipid Panel    Component Value Date/Time   CHOL 115 10/03/2011 0600    TRIG 63 10/03/2011 0600   HDL 55 10/03/2011 0600   CHOLHDL 2.1 10/03/2011 0600   VLDL 13 10/03/2011 0600   LDLCALC 47 10/03/2011 0600   HgbA1C  Lab Results  Component Value Date   HGBA1C 5.6 10/03/2011   Urine Drug Screen    Component Value Date/Time   LABOPIA POSITIVE* 10/02/2011 2007   COCAINSCRNUR NONE DETECTED 10/02/2011 2007   LABBENZ NONE DETECTED 10/02/2011 2007   AMPHETMU NONE DETECTED 10/02/2011 2007   THCU NONE DETECTED 10/02/2011 2007   LABBARB NONE DETECTED 10/02/2011 2007    Alcohol Level    Component Value Date/Time   [facility] <11 10/02/2011 2044     Results for orders placed during the hospital encounter of 10/02/11 (from the past 24 hour(s))  URINALYSIS, ROUTINE W REFLEX MICROSCOPIC     Status: Abnormal   Collection Time   10/02/11  8:07 PM      Component Value Range   Color, Urine AMBER (*) YELLOW    APPearance CLEAR  CLEAR    Specific Gravity, Urine 1.020  1.005 - 1.030    pH 7.0  5.0 - 8.0    Glucose, UA NEGATIVE  NEGATIVE (mg/dL)   Hgb urine dipstick NEGATIVE  NEGATIVE    Bilirubin Urine MODERATE (*) NEGATIVE    Ketones, ur 40 (*) NEGATIVE (mg/dL)   Protein, ur 30 (*) NEGATIVE (mg/dL)   Urobilinogen, UA >1.6 (*) 0.0 - 1.0 (mg/dL)   Nitrite POSITIVE (*) NEGATIVE    Leukocytes, UA SMALL (*) NEGATIVE   URINE RAPID DRUG SCREEN (HOSP PERFORMED)     Status: Abnormal   Collection Time   10/02/11  8:07 PM      Component Value Range   Opiates POSITIVE (*) NONE DETECTED    Cocaine NONE DETECTED  NONE DETECTED    Benzodiazepines NONE DETECTED  NONE DETECTED    Amphetamines NONE DETECTED  NONE DETECTED    Tetrahydrocannabinol NONE DETECTED  NONE DETECTED    Barbiturates NONE DETECTED  NONE DETECTED   URINE MICROSCOPIC-ADD ON     Status: Abnormal   Collection Time   10/02/11  8:07 PM      Component Value Range   Squamous Epithelial / LPF RARE  RARE    WBC, UA 7-10  <3 (WBC/hpf)   RBC / HPF 0-2  <3 (RBC/hpf)   Bacteria, UA MANY (*) RARE   GLUCOSE, CAPILLARY     Status:  Abnormal   Collection Time   10/02/11  8:14 PM      Component Value  Range   Glucose-Capillary 128 (*) 70 - 99 (mg/dL)  CBC     Status: Abnormal   Collection Time   10/02/11  8:44 PM      Component Value Range   WBC 5.5  4.0 - 10.5 (K/uL)   RBC 3.71 (*) 4.22 - 5.81 (MIL/uL)   Hemoglobin 13.3  13.0 - 17.0 (g/dL)   HCT 16.1 (*) 09.6 - 52.0 (%)   MCV 100.8 (*) 78.0 - 100.0 (fL)   MCH 35.8 (*) 26.0 - 34.0 (pg)   MCHC 35.6  30.0 - 36.0 (g/dL)   RDW 04.5  40.9 - 81.1 (%)   Platelets 125 (*) 150 - 400 (K/uL)  DIFFERENTIAL     Status: Abnormal   Collection Time   10/02/11  8:44 PM      Component Value Range   Neutrophils Relative 85 (*) 43 - 77 (%)   Neutro Abs 4.7  1.7 - 7.7 (K/uL)   Lymphocytes Relative 10 (*) 12 - 46 (%)   Lymphs Abs 0.6 (*) 0.7 - 4.0 (K/uL)   Monocytes Relative 5  3 - 12 (%)   Monocytes Absolute 0.3  0.1 - 1.0 (K/uL)   Eosinophils Relative 0  0 - 5 (%)   Eosinophils Absolute 0.0  0.0 - 0.7 (K/uL)   Basophils Relative 0  0 - 1 (%)   Basophils Absolute 0.0  0.0 - 0.1 (K/uL)  COMPREHENSIVE METABOLIC PANEL     Status: Abnormal   Collection Time   10/02/11  8:44 PM      Component Value Range   Sodium 135  135 - 145 (mEq/L)   Potassium 3.5  3.5 - 5.1 (mEq/L)   Chloride 93 (*) 96 - 112 (mEq/L)   CO2 25  19 - 32 (mEq/L)   Glucose, Bld 125 (*) 70 - 99 (mg/dL)   BUN 13  6 - 23 (mg/dL)   Creatinine, Ser 9.14  0.50 - 1.35 (mg/dL)   Calcium 8.4  8.4 - 78.2 (mg/dL)   Total Protein 6.8  6.0 - 8.3 (g/dL)   Albumin 3.6  3.5 - 5.2 (g/dL)   AST 48 (*) 0 - 37 (U/L)   ALT 21  0 - 53 (U/L)   Alkaline Phosphatase 113  39 - 117 (U/L)   Total Bilirubin 2.5 (*) 0.3 - 1.2 (mg/dL)   GFR calc non Af Amer 85 (*) >90 (mL/min)   GFR calc Af Amer >90  >90 (mL/min)  PROTIME-INR     Status: Normal   Collection Time   10/02/11  8:44 PM      Component Value Range   Prothrombin Time 14.7  11.6 - 15.2 (seconds)   INR 1.13  0.00 - 1.49   ETHANOL     Status: Normal   Collection Time    10/02/11  8:44 PM      Component Value Range   Alcohol, Ethyl (B) <11  0 - 11 (mg/dL)  TSH     Status: Normal   Collection Time   10/02/11  8:44 PM      Component Value Range   TSH 2.075  0.350 - 4.500 (uIU/mL)  SALICYLATE LEVEL     Status: Abnormal   Collection Time   10/02/11  8:44 PM      Component Value Range   Salicylate Lvl <2.0 (*) 2.8 - 20.0 (mg/dL)  ACETAMINOPHEN LEVEL     Status: Normal   Collection Time   10/02/11  8:44 PM  Component Value Range   Acetaminophen (Tylenol), Serum <15.0  10 - 30 (ug/mL)  TROPONIN I     Status: Normal   Collection Time   10/02/11  8:44 PM      Component Value Range   Troponin I <0.30  <0.30 (ng/mL)  LACTIC ACID, PLASMA     Status: Normal   Collection Time   10/02/11  9:48 PM      Component Value Range   Lactic Acid, Venous 1.6  0.5 - 2.2 (mmol/L)  GLUCOSE, CAPILLARY     Status: Abnormal   Collection Time   10/03/11 12:30 AM      Component Value Range   Glucose-Capillary 107 (*) 70 - 99 (mg/dL)  HEMOGLOBIN Z3Y     Status: Normal   Collection Time   10/03/11 12:31 AM      Component Value Range   Hemoglobin A1C 5.6  <5.7 (%)   Mean Plasma Glucose 114  <117 (mg/dL)  CARDIAC PANEL(CRET KIN+CKTOT+MB+TROPI)     Status: Abnormal   Collection Time   10/03/11 12:31 AM      Component Value Range   Total CK 469 (*) 7 - 232 (U/L)   CK, MB 3.0  0.3 - 4.0 (ng/mL)   Troponin I <0.30  <0.30 (ng/mL)   Relative Index 0.6  0.0 - 2.5   DIGOXIN LEVEL     Status: Abnormal   Collection Time   10/03/11 12:31 AM      Component Value Range   Digoxin Level 0.5 (*) 0.8 - 2.0 (ng/mL)  GLUCOSE, CAPILLARY     Status: Normal   Collection Time   10/03/11  4:46 AM      Component Value Range   Glucose-Capillary 84  70 - 99 (mg/dL)  LIPID PANEL     Status: Normal   Collection Time   10/03/11  6:00 AM      Component Value Range   Cholesterol 115  0 - 200 (mg/dL)   Triglycerides 63  <865 (mg/dL)   HDL 55  >78 (mg/dL)   Total CHOL/HDL Ratio 2.1     VLDL 13  0  - 40 (mg/dL)   LDL Cholesterol 47  0 - 99 (mg/dL)  GLUCOSE, CAPILLARY     Status: Normal   Collection Time   10/03/11  8:15 AM      Component Value Range   Glucose-Capillary 91  70 - 99 (mg/dL)   Comment 1 Notify RN      Dg Chest 1 View  10/02/2011  *RADIOLOGY REPORT*  Clinical Data: Altered mental status  CHEST - 1 VIEW  Comparison:  Chest radiograph 08/24/2010  Findings: Left-sided pacemaker overlies stable cardiac silhouette. Right port in place.  There is increased central venous congestion compared to prior.  No focal consolidation.  No pneumothorax. Degenerative osteophytosis of the thoracic spine.  IMPRESSION: Increase central venous pulmonary congestion.  Mild interstitial edema.  Original Report Authenticated By: Genevive Bi, M.D.   Ct Head Wo Contrast  10/03/2011  *RADIOLOGY REPORT*  Clinical Data: Follow-up subdural hematoma.  CT HEAD WITHOUT CONTRAST  Technique:  Contiguous axial images were obtained from the base of the skull through the vertex without contrast.  Comparison: 10/02/2011  Findings: Again noted is a left frontal subdural hematoma and a left parietal-occipital subdural hematoma.  The size and distribution of the hematomas have not significantly changed. There is probably a small amount of blood along the left posterior falx which is unchanged.  Again noted is  cerebral atrophy and evidence for encephalomalacia in the lower left frontal lobe. There is no significant midline shift or hydrocephalus.  Stable opacification and probably fluid involving the right mastoid air cells.  There is mild mucosal disease in the left ethmoid air cells.  Mild mucosal disease in the left maxillary sinus.  No acute bony abnormality.  IMPRESSION: Stable size and appearance of the left subdural hematomas.  No significant mass effect or hydrocephalus.  Stable opacification of the right mastoid air cells as described.  Cerebral atrophy and evidence for encephalomalacia in the left frontal lobe.   Original Report Authenticated By: Richarda Overlie, M.D.   Ct Head Wo Contrast  10/02/2011  *RADIOLOGY REPORT*  Clinical Data: 78 year old male with multiple falls and altered mental status.  CT HEAD WITHOUT CONTRAST  Technique:  Contiguous axial images were obtained from the base of the skull through the vertex without contrast.  Comparison: 08/22/2011  Findings: A left frontoparietal subdural hematoma is identified, measuring 6 mm in the frontal region and 4 mm in the posterior parietal region.  There is no evidence of midline shift or mass effect.  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  There is no evidence of hydrocephalus, acute infarction or mass lesion.  There is no evidence of acute fracture. A right mastoid effusion and fluid in the right middle ear again noted.  IMPRESSION: Left frontoparietal subdural hematoma without evidence of mass effect or midline shift.  This measures 6 mm maximally in the frontal region.  Atrophy, chronic small vessel white matter ischemic changes and remote left frontal infarct.  Unchanged right mastoid effusion and fluid in the right middle ear.  Critical Value/emergent results were called by telephone at the time of interpretation on 10/02/2011  at 8:55 p.m.  to  Dr. Radford Pax, who verbally acknowledged these results.  Original Report Authenticated By: Rosendo Gros, M.D.   MRI of the brain  Unable to obtain due pacer.  2D Echocardiogram  Performed with results pending.  Carotid Doppler/TCD  Performed with results pending.  Physical Exam   GENERAL:   Well nourished, well hydrated, no acute distress.   CARDIOVASCULAR:   Irregularly irregular, no thrills or palpable murmurs, S1, S2, no murmur, no rubs or gallops.      Carotid arteries: No carotid bruits.   RESPIRATORY:  Clear to auscultation bilaterally, no wheezes, rhonci or rales  MENTAL STATUS EXAM:    Orientation:  Alert and oriented to person,  place and time.      Memory:  Cooperative, follows commands well.  Recent and remote memory normal.      Attention, concentration:  Attention span and concentration are normal.      Language:  Speech is mildly dysarthric; he has some word finding difficulty but otherwise intact speech now.   CRANIAL NERVES:     CN 2 (Optic):  R homonymous hemianopsia, funduscopic examination without optic disk pallor or edema, retinal vessels are normal.      CN 3,4,6 (EOM):  Pupils equal and reactive to light and near full eye movement without nystagmus.      CN 5 (Trigeminal):  Facial sensation is normal, no weakness of masticatory muscles.      CN 7 (Facial):  No facial weakness or asymmetry.      CN 8 (Auditory):  Auditory acuity grossly normal.      CN 9,10 (Glossophar):  The uvula is midline, the palate elevates symmetrically.  CN 11 (spinal access):  Normal sternocleidomastoid and trapezius strength.      CN 12 (Hypoglossal):  The tongue is midline. No atrophy or fasciculations.   MOTOR:    Deltoids:            (R): 5  (L): 5      Biceps:                       (R): 5  (L): 5      Triceps:                       (R): 5  (L): 5      Wrist Extensors:         (R): 5  (L): 5      Wrist Flexors:      (R): 5  (L): 5      Flexor Pollicis Longus:      (R): 5  (L): 5      Adductor Digiti Minimi:       (R): 5  (L): 5      Abductor Pollicis Brevis:   (R): 5  (L): 5      Hip Flexors:                       (R): 5  (L): 5      Quadriceps:                       (R): 5  (L): 5      Hamstrings:                       (R): 5  (L): 5      Tibialis Anterior:                 (R): 5  (L): 5      Medial Gastrocnemius:     (R): 5  (L): 5      Extensor Hallucis              (R): 5  (L): 5  Muscle Tone: Tone and muscle bulk are normal in the upper and lower extremities.  REFLEXES:     Triceps:                 (R): 2+  (L): 2+      Biceps:                  (R): 2+  (L): 2+      Brachioradialis:     (R): 2+  (L): 2+         Patellar:                 (R): 2+  (L): 2+      Achilles:                 (R): 2+  (L): 2+      Hoffman:    (R): absent  (L): absent      Babinski:    (R): absent  (L): absent   COORDINATION:     Intact finger-to-nose, heel-to-shin, and rapid alternating movements. No tremor.   SENSATION:     Intact to light touch.   GAIT:     Deferred.  ASSESSMENT 78 y.o. male presenting with new onset moderately severe expressive aphasia as well as right visual field defect.  Although patient has a small left frontal subdural hematoma, it is likely that he has experienced a recurrent acute stroke involving the left middle cerebral artery territory. He is unable to get an MRI due to his pacer. Symptoms appear improved today but still not back to baseline.  Stroke Risk Factors - atrial fibrillation, diabetes mellitus, hyperlipidemia and hypertension  Hospital day # 1  TREATMENT/PLAN -Would recommend long-term treatment with aspirin and Plavix. Will hold these due to acute SDH until this appears isodense. -Patient is not a good candidate for anticoagulation due to his excessive EtOH use and frequent falls. -Awaiting final results from TTE and CUS. -DVT ppx with SCDs due to SDH.  Kipp Laurence, MD [facility] Pager: [phone] 10/03/2011 9:50 AM

## 2011-10-03 NOTE — Evaluation (Signed)
Physical Therapy Evaluation Patient Details Name: [name] MRN: [id number] DOB: Feb 03, 1930 Today's Date: 10/03/2011  Problem List:  Patient Active Problem List  Diagnoses  . Primary mucoepidermoid carcinoma of parotid gland  . Bladder cancer  . Elevated PSA  . Pacemaker  . Diabetes mellitus  . Hypertension  . Dyslipidemia  . Cancer  . Subdural hematoma  . ETOH abuse  . Atrial fibrillation  . UTI (lower urinary tract infection)  . TIA (transient ischemic attack)    Past Medical History:  Past Medical History  Diagnosis Date  . Atrial fibrillation 1990  . Dyslipidemia   . Diabetes mellitus   . Prostate hypertrophy   . Arthritis     degenerative-knees  . Bladder cancer 2010  . 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 History of radiation therapy 05/05/07-06/15/07    right parotid through subclavicular region/ mid jugularlymph node chain  . Allergy   . Hypertension     labile  . Heart murmur     2/6 systolic  . History of chemotherapy      Hx carboplatin/5fu  . Xerostomia     limited   Past Surgical History:  Past Surgical History  Procedure Date  . Pacemaker insertion 1990  . Cystoscopy 01/2011    neg    PT Assessment/Plan/Recommendation PT Assessment Clinical Impression Statement: Pt presents with a medical diagnosis of SDH s/p fall. Pt has had numerous falls secondary to alcohol. Pt presents with decreased balance and stability during ambulation requiring increased assistance as well as decreased cognition with slow processing. Pt will benefit from skilled PT in the acute care setting in order to maximize functional mobility and safety prior to d/c PT Recommendation/Assessment: Patient will need skilled PT in the acute care venue PT Problem List: Decreased activity tolerance;Decreased balance;Decreased mobility;Decreased knowledge of use of DME;Decreased safety awareness PT Therapy Diagnosis : Difficulty walking PT Plan PT Frequency: Min 4X/week PT  Treatment/Interventions: DME instruction;Gait training;Stair training;Functional mobility training;Therapeutic activities;Therapeutic exercise;Balance training;Neuromuscular re-education;Patient/family education PT Recommendation Follow Up Recommendations: Home health PT;Supervision/Assistance - 24 hour (HH TBD pending progress) Equipment Recommended: Rolling walker with 5" wheels (pending progress) PT Goals  Acute Rehab PT Goals PT Goal Formulation: With patient/family Time For Goal Achievement: 2 weeks Pt will go Sit to Stand: with modified independence PT Goal: Sit to Stand - Progress: Goal set today Pt will go Stand to Sit: with modified independence PT Goal: Stand to Sit - Progress: Goal set today Pt will Transfer Bed to Chair/Chair to Bed: with supervision PT Transfer Goal: Bed to Chair/Chair to Bed - Progress: Goal set today Pt will Ambulate: >150 feet;with supervision;with least restrictive assistive device PT Goal: Ambulate - Progress: Goal set today Pt will Go Up / Down Stairs: 1-2 stairs;with supervision;with rail(s) PT Goal: Up/Down Stairs - Progress: Goal set today  PT Evaluation Precautions/Restrictions  Precautions Precautions: Fall Restrictions Weight Bearing Restrictions: No Prior Functioning  Home Living Lives With: Daughter Available Help at Discharge: Family Type of Home: House Home Access: Stairs to enter Secretary/administrator of Steps: 2 Entrance Stairs-Rails: Right Home Layout: One level Bathroom Shower/Tub: Forensic scientist: Handicapped height Bathroom Accessibility: Yes How Accessible: Accessible via walker Home Adaptive Equipment: Walker - rolling;Straight cane Prior Function Level of Independence: Independent Able to Take Stairs?: Yes Driving: Yes Vocation: Retired Financial risk analyst Arousal/Alertness: Awake/alert Overall Cognitive Status: Appears within functional limits for tasks assessed Orientation Level: Oriented  to person;Oriented to place;Oriented to time Sensation/Coordination Educational psychologist  Movements are Fluid and Coordinated: Yes Extremity Assessment RLE Assessment RLE Assessment: Within Functional Limits LLE Assessment LLE Assessment: Within Functional Limits Mobility (including Balance) Bed Mobility Bed Mobility: Yes Supine to Sit: 5: Supervision Supine to Sit Details (indicate cue type and reason): VC for sequencing, pt required increased time to complete Sitting - Scoot to Edge of Bed: 6: Modified independent (Device/Increase time) Transfers Transfers: Yes Sit to Stand: 4: Min assist;With upper extremity assist;From bed;From toilet Sit to Stand Details (indicate cue type and reason): Min assist for stability. VC for hand placement for safety and anterior translation Stand to Sit: 4: Min assist;With upper extremity assist;To toilet;To chair/3-in-1 Stand to Sit Details: VC for hand placement. Pt able to control descent with min assist Ambulation/Gait Ambulation/Gait: Yes Ambulation/Gait Assistance: 3: Mod assist Ambulation/Gait Assistance Details (indicate cue type and reason): Mod assist for stability secondary to imbalance during ambulation. Attempted 1 hand held assist, pt reaching out for objects. Recommend RW next session for safety and to assess ambulation to gain independence Ambulation Distance (Feet): 50 Feet Assistive device: 2 person hand held assist Gait Pattern: Step-to pattern;Decreased stride length;Decreased hip/knee flexion - right;Decreased hip/knee flexion - left;Trunk flexed Gait velocity: decreased gait speed Stairs: No    Exercise    End of Session PT - End of Session Equipment Utilized During Treatment: Gait belt Activity Tolerance: Patient tolerated treatment well Patient left: in chair;with call bell in reach;with family/visitor present Nurse Communication: Mobility status for transfers;Mobility status for ambulation General Behavior During  Session: [facility] for tasks performed Cognition: Impaired Cognitive Impairment: decreased safety awareness, awareness of deficits as well as slow to process information and cueing Patient Class: Inpatient Milana Kidney 10/03/2011, 5:24 PM  10/03/2011 Milana Kidney DPT PAGER: [phone] OFFICE: [phone]

## 2011-10-03 NOTE — Progress Notes (Signed)
VASCULAR LAB PRELIMINARY  PRELIMINARY  PRELIMINARY  PRELIMINARY  Carotid duplex  completed.    Preliminary report:  Bilateral:  No evidence of hemodynamically significant internal carotid artery stenosis.   Vertebral artery flow is antegrade.      Terance Hart, RVT 10/03/2011, 10:11 AM

## 2011-10-04 ENCOUNTER — Inpatient Hospital Stay (HOSPITAL_COMMUNITY): Payer: Medicare Other

## 2011-10-04 DIAGNOSIS — J810 Acute pulmonary edema: Secondary | ICD-10-CM

## 2011-10-04 DIAGNOSIS — F10931 Alcohol use, unspecified with withdrawal delirium: Secondary | ICD-10-CM

## 2011-10-04 DIAGNOSIS — I6200 Nontraumatic subdural hemorrhage, unspecified: Principal | ICD-10-CM

## 2011-10-04 DIAGNOSIS — G459 Transient cerebral ischemic attack, unspecified: Secondary | ICD-10-CM

## 2011-10-04 DIAGNOSIS — I4891 Unspecified atrial fibrillation: Secondary | ICD-10-CM

## 2011-10-04 DIAGNOSIS — F10231 Alcohol dependence with withdrawal delirium: Secondary | ICD-10-CM

## 2011-10-04 LAB — URINE CULTURE: Culture  Setup Time: 201304122112

## 2011-10-04 LAB — GLUCOSE, CAPILLARY
Glucose-Capillary: 52 mg/dL — ABNORMAL LOW (ref 70–99)
Glucose-Capillary: 158 mg/dL — ABNORMAL HIGH (ref 70–99)

## 2011-10-04 MED ORDER — METOPROLOL SUCCINATE ER 50 MG PO TB24
50.0000 mg | ORAL_TABLET | Freq: Every day | ORAL | Status: DC
  Administered 2011-10-05 – 2011-10-09 (×5): 50 mg via ORAL
  Filled 2011-10-04 (×5): qty 1

## 2011-10-04 MED ORDER — METOPROLOL TARTRATE 1 MG/ML IV SOLN
5.0000 mg | Freq: Once | INTRAVENOUS | Status: AC
  Administered 2011-10-04: 5 mg via INTRAVENOUS

## 2011-10-04 MED ORDER — CHLORDIAZEPOXIDE HCL 5 MG PO CAPS: 25.0000 mg | ORAL_CAPSULE | Freq: Three times a day (TID) | ORAL | Status: DC

## 2011-10-04 MED ORDER — INSULIN ASPART 100 UNIT/ML ~~LOC~~ SOLN
0.0000 [IU] | SUBCUTANEOUS | Status: DC
  Administered 2011-10-04: 1 [IU] via SUBCUTANEOUS

## 2011-10-04 MED ORDER — FUROSEMIDE 10 MG/ML IJ SOLN
40.0000 mg | Freq: Once | INTRAMUSCULAR | Status: AC
  Administered 2011-10-04: 40 mg via INTRAVENOUS
  Filled 2011-10-04: qty 4

## 2011-10-04 MED ORDER — LORAZEPAM 1 MG PO TABS: 1.0000 mg | ORAL_TABLET | ORAL | Status: DC | PRN

## 2011-10-04 MED ORDER — THIAMINE HCL 100 MG/ML IJ SOLN
100.0000 mg | Freq: Every day | INTRAMUSCULAR | Status: AC
  Administered 2011-10-04: 100 mg via INTRAVENOUS
  Filled 2011-10-04 (×2): qty 1

## 2011-10-04 MED ORDER — LORAZEPAM 2 MG/ML IJ SOLN
1.0000 mg | INTRAMUSCULAR | Status: DC | PRN
  Filled 2011-10-04: qty 1

## 2011-10-04 MED ORDER — SODIUM CHLORIDE 0.9 % IV SOLN
INTRAVENOUS | Status: DC
  Administered 2011-10-04 – 2011-10-08 (×2): 20 mL/h via INTRAVENOUS

## 2011-10-04 MED ORDER — BIOTENE DRY MOUTH MT LIQD
15.0000 mL | Freq: Two times a day (BID) | OROMUCOSAL | Status: DC
  Administered 2011-10-04 – 2011-10-09 (×7): 15 mL via OROMUCOSAL

## 2011-10-04 MED ORDER — LORAZEPAM 2 MG/ML IJ SOLN
2.0000 mg | Freq: Once | INTRAMUSCULAR | Status: AC
Start: 2011-10-04 — End: 2011-10-04
  Administered 2011-10-04: 2 mg via INTRAVENOUS

## 2011-10-04 MED ORDER — VITAMIN B-1 100 MG PO TABS
100.0000 mg | ORAL_TABLET | Freq: Every day | ORAL | Status: DC
  Administered 2011-10-05 – 2011-10-09 (×5): 100 mg via ORAL
  Filled 2011-10-04 (×5): qty 1

## 2011-10-04 MED ORDER — SODIUM CHLORIDE 0.9 % IV SOLN: 1.0000 mg | Freq: Once | INTRAVENOUS | Status: DC

## 2011-10-04 MED ORDER — LORAZEPAM 2 MG/ML IJ SOLN
1.0000 mg | INTRAMUSCULAR | Status: DC | PRN
  Administered 2011-10-04: 1 mg via INTRAVENOUS
  Filled 2011-10-04: qty 1

## 2011-10-04 MED ORDER — METOPROLOL TARTRATE 1 MG/ML IV SOLN
INTRAVENOUS | Status: AC
  Administered 2011-10-04: 5 mg via INTRAVENOUS
  Filled 2011-10-04: qty 5

## 2011-10-04 MED ORDER — LORAZEPAM 2 MG/ML IJ SOLN
1.0000 mg | INTRAMUSCULAR | Status: DC | PRN
  Administered 2011-10-06: 1 mg via INTRAVENOUS
  Filled 2011-10-04: qty 1

## 2011-10-04 MED ORDER — LORAZEPAM 2 MG/ML IJ SOLN
2.0000 mg | Freq: Once | INTRAMUSCULAR | Status: AC
  Administered 2011-10-04: 2 mg via INTRAVENOUS

## 2011-10-04 MED ORDER — SODIUM CHLORIDE 0.9 % IV SOLN
0.2000 ug/kg/h | INTRAVENOUS | Status: DC
  Administered 2011-10-04: 0.2 ug/kg/h via INTRAVENOUS
  Administered 2011-10-04: 0.3 ug/kg/h via INTRAVENOUS
  Filled 2011-10-04 (×4): qty 2

## 2011-10-04 MED ORDER — FOLIC ACID 5 MG/ML IJ SOLN
1.0000 mg | Freq: Once | INTRAMUSCULAR | Status: AC
  Administered 2011-10-04: 1 mg via INTRAVENOUS
  Filled 2011-10-04 (×2): qty 0.2

## 2011-10-04 MED ORDER — DEXTROSE 50 % IV SOLN
25.0000 mL | Freq: Once | INTRAVENOUS | Status: AC | PRN
  Administered 2011-10-04: 25 mL via INTRAVENOUS

## 2011-10-04 MED ORDER — LORAZEPAM 2 MG/ML IJ SOLN
2.0000 mg | INTRAMUSCULAR | Status: DC | PRN
  Administered 2011-10-04: 2 mg via INTRAVENOUS
  Filled 2011-10-04: qty 1

## 2011-10-04 MED ORDER — LORAZEPAM 2 MG/ML IJ SOLN
1.0000 mg | Freq: Once | INTRAMUSCULAR | Status: AC
  Administered 2011-10-04: 1 mg via INTRAVENOUS

## 2011-10-04 NOTE — Progress Notes (Signed)
Have notified Dr. Reuben Likes in regards to patient's CIWA levell which is 26. Pt. Being transferred to 2108 in bed. Phone report has been called to [place], Charity fundraiser. Pt. Did receive 5mg  IV Lopressor per CCM9 Dr. Delford Field). Will notify family of transfer.

## 2011-10-04 NOTE — Progress Notes (Signed)
Pt verbally abusive, trying to get out of bed, pulling at lines and interfering with care; Daphane Shepherd, NP notified; new orders for soft restraints received; will continue to monitor

## 2011-10-04 NOTE — Progress Notes (Signed)
Accessed Rt power port with 19ga HPN two attempts.  Unable to get blood return.  Flushed multiple times without difficulties.  No infiltrate or swelling.  Needle feels to be in port.  IV fluids infusing without difficulties.  Will continue to monitor.

## 2011-10-04 NOTE — Progress Notes (Signed)
[facility]  Subjective: 78 y/o who presented to the ER for slurred speech which has resolved. He has a h/o frequent falls and was in the ER 2 wks ago with a head injury and was found incidentally on CT head to have a subdural hematoma.  We are working up a TIA. Neuro is following.   Patient evaluated this AM- quite confused and agitated. I asked RN to give 2 more mg Ativan after which he was still alert but not as agitated. I increased his PRn ativan from 1 mg Q2 to 2mg  Q2. However, about 30 min later i was called by Rn that he was very agitated again- yelling, HR and BP high. I asked for a PCCm consult at this point and gave Dr Delford Field a brief explanation on this patient.   Objective: Blood pressure 149/112, pulse 100, temperature 97.9 F (36.6 C), temperature source Oral, resp. rate 49, height 6\' 1"  (1.854 m), weight 80.5 kg (177 lb 7.5 oz), SpO2 97.00%. Weight change:   Intake/Output Summary (Last 24 hours) at 10/04/11 1132 Last data filed at 10/04/11 0800  Gross per 24 hour  Intake      0 ml  Output   1050 ml  Net  -1050 ml    Physical Exam: General appearance: alert, confused Lungs: clear to auscultation bilaterally Heart: RRR, tachycardic Abdomen: soft, non-tender; bowel sounds normal; no masses,  no organomegaly Extremities: extremities normal, atraumatic, no cyanosis or edema Neurologic: Grossly normal  Lab Results:  [facility] 10/02/11 2044  NA 135  K 3.5  CL 93*  CO2 25  GLUCOSE 125*  BUN 13  CREATININE 0.74  CALCIUM 8.4  MG --  PHOS --    Basename 10/02/11 2044  AST 48*  ALT 21  ALKPHOS 113  BILITOT 2.5*  PROT 6.8  ALBUMIN 3.6   No results found for this basename: LIPASE:2,AMYLASE:2 in the last 72 hours  Basename 10/02/11 2044  WBC 5.5  NEUTROABS 4.7  HGB 13.3  HCT 37.4*  MCV 100.8*  PLT 125*    Basename 10/03/11 0031 10/02/11 2044  CKTOTAL 469* --  CKMB 3.0 --  CKMBINDEX -- --  TROPONINI <0.30 <0.30   No components found with this  basename: POCBNP:3 No results found for this basename: DDIMER:2 in the last 72 hours  Basename 10/03/11 0031  HGBA1C 5.6    Basename 10/03/11 0600  CHOL 115  HDL 55  LDLCALC 47  TRIG 63  CHOLHDL 2.1  LDLDIRECT --    Basename 10/02/11 2044  TSH 2.075  T4TOTAL --  T3FREE --  THYROIDAB --   No results found for this basename: VITAMINB12:2,FOLATE:2,FERRITIN:2,TIBC:2,IRON:2,RETICCTPCT:2 in the last 72 hours  Micro Results: Recent Results (from the past 240 hour(s))  URINE CULTURE     Status: Normal   Collection Time   10/02/11  8:07 PM      Component Value Range Status Comment   Specimen Description URINE, RANDOM   Final    Special Requests NONE   Final    Culture  Setup Time [id number]   Final    Colony Count 20,OOO COLONIES/ML   Final    Culture     Final    Value: Multiple bacterial morphotypes present, none predominant. Suggest appropriate recollection if clinically indicated.   Report Status 10/04/2011 FINAL   Final   CULTURE, BLOOD (ROUTINE X 2)     Status: Normal (Preliminary result)   Collection Time   10/03/11 12:25 AM  Component Value Range Status Comment   Specimen Description BLOOD RIGHT ARM   Final    Special Requests BOTTLES DRAWN AEROBIC AND ANAEROBIC 10CC EACH   Final    Culture  Setup Time [id number]   Final    Culture     Final    Value:        BLOOD CULTURE RECEIVED NO GROWTH TO DATE CULTURE WILL BE HELD FOR 5 DAYS BEFORE ISSUING A FINAL NEGATIVE REPORT   Report Status PENDING   Incomplete   CULTURE, BLOOD (ROUTINE X 2)     Status: Normal (Preliminary result)   Collection Time   10/03/11 12:35 AM      Component Value Range Status Comment   Specimen Description BLOOD LEFT HAND   Final    Special Requests BOTTLES DRAWN AEROBIC AND ANAEROBIC 10CC EACH   Final    Culture  Setup Time [id number]   Final    Culture     Final    Value:        BLOOD CULTURE RECEIVED NO GROWTH TO DATE CULTURE WILL BE HELD FOR 5 DAYS BEFORE ISSUING A FINAL NEGATIVE  REPORT   Report Status PENDING   Incomplete     Studies/Results: Dg Chest 1 View  10/02/2011  *RADIOLOGY REPORT*  Clinical Data: Altered mental status  CHEST - 1 VIEW  Comparison:  Chest radiograph 08/24/2010  Findings: Left-sided pacemaker overlies stable cardiac silhouette. Right port in place.  There is increased central venous congestion compared to prior.  No focal consolidation.  No pneumothorax. Degenerative osteophytosis of the thoracic spine.  IMPRESSION: Increase central venous pulmonary congestion.  Mild interstitial edema.  Original Report Authenticated By: Genevive Bi, M.D.   Ct Head Wo Contrast  10/03/2011  *RADIOLOGY REPORT*  Clinical Data: Follow-up subdural hematoma.  CT HEAD WITHOUT CONTRAST  Technique:  Contiguous axial images were obtained from the base of the skull through the vertex without contrast.  Comparison: 10/02/2011  Findings: Again noted is a left frontal subdural hematoma and a left parietal-occipital subdural hematoma.  The size and distribution of the hematomas have not significantly changed. There is probably a small amount of blood along the left posterior falx which is unchanged.  Again noted is cerebral atrophy and evidence for encephalomalacia in the lower left frontal lobe. There is no significant midline shift or hydrocephalus.  Stable opacification and probably fluid involving the right mastoid air cells.  There is mild mucosal disease in the left ethmoid air cells.  Mild mucosal disease in the left maxillary sinus.  No acute bony abnormality.  IMPRESSION: Stable size and appearance of the left subdural hematomas.  No significant mass effect or hydrocephalus.  Stable opacification of the right mastoid air cells as described.  Cerebral atrophy and evidence for encephalomalacia in the left frontal lobe.  Original Report Authenticated By: Richarda Overlie, M.D.   Ct Head Wo Contrast  10/02/2011  *RADIOLOGY REPORT*  Clinical Data: 78 year old male with multiple falls  and altered mental status.  CT HEAD WITHOUT CONTRAST  Technique:  Contiguous axial images were obtained from the base of the skull through the vertex without contrast.  Comparison: 08/22/2011  Findings: A left frontoparietal subdural hematoma is identified, measuring 6 mm in the frontal region and 4 mm in the posterior parietal region.  There is no evidence of midline shift or mass effect.  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  There is no  evidence of hydrocephalus, acute infarction or mass lesion.  There is no evidence of acute fracture. A right mastoid effusion and fluid in the right middle ear again noted.  IMPRESSION: Left frontoparietal subdural hematoma without evidence of mass effect or midline shift.  This measures 6 mm maximally in the frontal region.  Atrophy, chronic small vessel white matter ischemic changes and remote left frontal infarct.  Unchanged right mastoid effusion and fluid in the right middle ear.  Critical Value/emergent results were called by telephone at the time of interpretation on 10/02/2011  at 8:55 p.m.  to  Dr. Radford Pax, who verbally acknowledged these results.  Original Report Authenticated By: Rosendo Gros, M.D.    Medications: Scheduled Meds:    . atorvastatin  40 mg Oral Daily  . cefTRIAXone (ROCEPHIN) IVPB 1 gram/50 mL D5W  1 g Intravenous Q24H  . digoxin  125 mcg Oral Daily  . folic acid  1 mg Intravenous Once  . furosemide  40 mg Intravenous Once  . insulin aspart  0-9 Units Subcutaneous Q4H  . LORazepam  1 mg Intravenous Once  . LORazepam  2 mg Intravenous Once  . LORazepam  2 mg Intravenous Once  . LORazepam  2 mg Intravenous Once  . metoprolol  5 mg Intravenous Once  . metoprolol succinate  50 mg Oral Daily  . mulitivitamin with minerals  1 tablet Oral Daily  . pantoprazole  40 mg Oral Daily  . thiamine  100 mg Intravenous Daily  . thiamine  100 mg Oral Daily  . DISCONTD:  chlordiazePOXIDE  25 mg Oral TID  . DISCONTD: folic acid  1 mg Oral Daily  . DISCONTD: folic acid (FOLVITE) IVPB  1 mg Intravenous Once  . DISCONTD: insulin aspart  0-9 Units Subcutaneous Q4H  . DISCONTD: insulin aspart  0-9 Units Subcutaneous TID WC  . DISCONTD: LORazepam  0-4 mg Intravenous Q6H  . DISCONTD: LORazepam  0-4 mg Intravenous Q12H  . DISCONTD: metoprolol succinate  25 mg Oral Daily  . DISCONTD: ramipril  10 mg Oral Daily  . DISCONTD: thiamine  100 mg Intravenous Daily  . DISCONTD: thiamine  100 mg Oral Daily   Continuous Infusions:    . sodium chloride 100 mL/hr at 10/04/11 0900  . dexmedetomidine (PRECEDEX) IV infusion     PRN Meds:.acetaminophen, LORazepam, DISCONTD: LORazepam, DISCONTD: LORazepam, DISCONTD: LORazepam, DISCONTD: LORazepam, DISCONTD: LORazepam, DISCONTD: LORazepam, DISCONTD: LORazepam  Assessment/Plan: Principal Problem:  *TIA (transient ischemic attack)/ CVA? - unfortunately due to subdural hematoma and falls, Plavix has been stopped.  - carotid doppler wo significant stenosis ECHO-Mild dyskinetic septal motiofn. 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 Underlying AF w VVIR pacing. EF approx. 50%. No significant valvular disease. No LA or LV clots seen. If indicated TEE may be helpful.    Subdural hematoma NS has evaluated- f/u CT is stable. NS signed off.    UTI (lower urinary tract infection) Rocephin - f/u culture has < 20,000 colonies of multiple mophotypes D/c Rocephin  Frequent falls May need SNF - f/u PT eval    Atrial fibrillation/Pacemaker V- paced- cont  BB and Dig - can change to IV if too somnolent from Precedex.    Diabetes mellitus Sugars stable, on sliding scale- holding actos   Hypertension BP stable   ETOH abuse in severe withdrawal  Appreciate PCCM taking over care of this patient for his DTs. We will pick back up when DTs resolved.     LOS: 2 days   [facility] [phone]  10/04/2011, 11:32 AM

## 2011-10-04 NOTE — Consult Note (Addendum)
Name: [name] MRN: [id number] DOB: 11/04/1929    LOS: 2  PCCM CONSULT  NOTE  History of Present Illness: 78 y/o who presented to the ER for slurred speech which has resolved. He has a h/o frequent falls and was in the ER 2 wks ago with a head injury and was found incidentally on CT head to have a subdural hematoma.  PCCM called 10/03/12 for worsening agitation and ETOH WD. SDH are stable on CT 10/02/12.   Lines / Drains: none  Cultures: BCx 2 4/13>>> UC 4/13>>>mult org  Antibiotics: Rocephin 4/12 (UTI)>>  Tests / Events: CT HEad 4/13>>>SDH stable    Past Medical History  Diagnosis Date  . Atrial fibrillation 1990  . Dyslipidemia   . Diabetes mellitus   . Prostate hypertrophy   . Arthritis     degenerative-knees  . Bladder cancer 2010  . 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 History of radiation therapy 05/05/07-06/15/07    right parotid through subclavicular region/ mid jugularlymph node chain  . Allergy   . Hypertension     labile  . Heart murmur     2/6 systolic  . History of chemotherapy      Hx carboplatin/58fu  . Xerostomia     limited   Past Surgical History  Procedure Date  . Pacemaker insertion 1990  . Cystoscopy 01/2011    neg   Prior to Admission medications   Medication Sig Start Date End Date Taking? Authorizing Provider  aspirin 81 MG tablet Take 81 mg by mouth daily.    Yes Historical Provider, MD  atorvastatin (LIPITOR) 40 MG tablet Take 40 mg by mouth daily.    Yes Historical Provider, MD  clopidogrel (PLAVIX) 75 MG tablet Take 75 mg by mouth daily.    Yes Historical Provider, MD  digoxin (LANOXIN) 0.25 MG tablet Take 125 mcg by mouth daily.   Yes Historical Provider, MD  metoprolol succinate (TOPROL-XL) 25 MG 24 hr tablet Take 25 mg by mouth daily.    Yes Historical Provider, MD  Multiple Vitamin (MULITIVITAMIN WITH MINERALS) TABS Take 1 tablet by mouth daily.   Yes Historical Provider, MD  pantoprazole (PROTONIX) 40 MG tablet Take 40 mg  by mouth daily.    Yes Historical Provider, MD  pioglitazone (ACTOS) 15 MG tablet Take 15 mg by mouth daily.   Yes Historical Provider, MD  ramipril (ALTACE) 10 MG tablet Take 10 mg by mouth daily.    Yes Historical Provider, MD  zolpidem (AMBIEN) 10 MG tablet Take 10 mg by mouth at bedtime as needed. For sleep   Yes Historical Provider, MD   Allergies Allergies  Allergen Reactions  . Codeine     Blisters between fingers  . Docetaxel Rash    Family History Family History  Problem Relation Age of Onset  . Heart disease Mother   . Cancer Father   . Emphysema Brother   . Coronary artery disease Son     Social History  reports that he has never smoked. He does not have any smokeless tobacco history on file. He reports that he drinks about 2 ounces of alcohol per week. He reports that he does not use illicit drugs.  Review Of Systems  Unable to obtain d/t agitation   Vital Signs: Temp:  [97.9 F (36.6 C)-99 F (37.2 C)] 97.9 F (36.6 C) (04/14 0700) Pulse Rate:  [70-113] 100  (04/14 0941) Resp:  [18-49] 49  (04/14 0300) BP: (104-149)/(60-112) 149/112  mmHg (04/14 0939) SpO2:  [97 %-99 %] 97 % (04/13 2343) I/O last 3 completed shifts: In: 525 [I.V.:525] Out: 1275 [Urine:1275]  Physical Examination: General:  Agitated delirium Neuro:  Moves all 4s,  agitation   HEENT:  Dry mucus membranes Neck:  Supple, no jvd.  No tmg   Cardiovascular:  Tachy, nl s1/s2  No s3/s4  No m r h g Lungs:  rales Abdomen:  Soft NT bsa Musculoskeletal:  from Skin:  Mult skin tears, bruising  Ventilator settings:  On nasal oxygen, not intubated    Labs and Imaging:  Reviewed.  Please refer to the Assessment and Plan section for relevant results. Dg Chest 1 View  10/02/2011  *RADIOLOGY REPORT*  Clinical Data: Altered mental status  CHEST - 1 VIEW  Comparison:  Chest radiograph 08/24/2010  Findings: Left-sided pacemaker overlies stable cardiac silhouette. Right port in place.  There is increased  central venous congestion compared to prior.  No focal consolidation.  No pneumothorax. Degenerative osteophytosis of the thoracic spine.  IMPRESSION: Increase central venous pulmonary congestion.  Mild interstitial edema.  Original Report Authenticated By: Genevive Bi, M.D.   Ct Head Wo Contrast  10/03/2011  *RADIOLOGY REPORT*  Clinical Data: Follow-up subdural hematoma.  CT HEAD WITHOUT CONTRAST  Technique:  Contiguous axial images were obtained from the base of the skull through the vertex without contrast.  Comparison: 10/02/2011  Findings: Again noted is a left frontal subdural hematoma and a left parietal-occipital subdural hematoma.  The size and distribution of the hematomas have not significantly changed. There is probably a small amount of blood along the left posterior falx which is unchanged.  Again noted is cerebral atrophy and evidence for encephalomalacia in the lower left frontal lobe. There is no significant midline shift or hydrocephalus.  Stable opacification and probably fluid involving the right mastoid air cells.  There is mild mucosal disease in the left ethmoid air cells.  Mild mucosal disease in the left maxillary sinus.  No acute bony abnormality.  IMPRESSION: Stable size and appearance of the left subdural hematomas.  No significant mass effect or hydrocephalus.  Stable opacification of the right mastoid air cells as described.  Cerebral atrophy and evidence for encephalomalacia in the left frontal lobe.  Original Report Authenticated By: Richarda Overlie, M.D.   Ct Head Wo Contrast  10/02/2011  *RADIOLOGY REPORT*  Clinical Data: 78 year old male with multiple falls and altered mental status.  CT HEAD WITHOUT CONTRAST  Technique:  Contiguous axial images were obtained from the base of the skull through the vertex without contrast.  Comparison: 08/22/2011  Findings: A left frontoparietal subdural hematoma is identified, measuring 6 mm in the frontal region and 4 mm in the posterior  parietal region.  There is no evidence of midline shift or mass effect.  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  There is no evidence of hydrocephalus, acute infarction or mass lesion.  There is no evidence of acute fracture. A right mastoid effusion and fluid in the right middle ear again noted.  IMPRESSION: Left frontoparietal subdural hematoma without evidence of mass effect or midline shift.  This measures 6 mm maximally in the frontal region.  Atrophy, chronic small vessel white matter ischemic changes and remote left frontal infarct.  Unchanged right mastoid effusion and fluid in the right middle ear.  Critical Value/emergent results were called by telephone at the time of interpretation on 10/02/2011  at 8:55 p.m.  to  Dr. Radford Pax, who verbally acknowledged these results.  Original Report Authenticated By: Rosendo Gros, M.D.    Lab 10/02/11 2044  NA 135  K 3.5  CL 93*  CO2 25  GLUCOSE 125*  BUN 13  CREATININE 0.74  CALCIUM 8.4  MG --  PHOS --    Lab 10/02/11 2044  HGB 13.3  HCT 37.4*  WBC 5.5  PLT 125*    Lab 10/02/11 2044  AST 48*  ALT 21  ALKPHOS 113  BILITOT 2.5*  PROT 6.8  ALBUMIN 3.6  INR 1.13    Assessment and Plan: Patient Active Hospital Problem List: TIA (transient ischemic attack) (10/03/2011)   Assessment: Will need neuro assessment per neurosurgery   Plan: call neurology  Pacemaker (05/19/2011)   Assessment: stable   Plan: monitor  Diabetes mellitus (05/19/2011)   Assessment: CBG stable  Lab 10/04/11 0751 10/03/11 2201 10/03/11 1655 10/03/11 1211 10/03/11 0815  GLUCAP 121* 117* 104* 93 91   Plan:  Cont SSI  Hypertension (05/19/2011)   Assessment: htnive   Plan: IV lopressor, lasix  Subdural hematoma (10/02/2011)   Assessment: stable on CT 4/13   Plan: per neuro  ETOH abuse (10/02/2011)   Assessment: agitated delirium, CIWA 24.   Plan: start dexametomidine  Atrial  fibrillation (10/02/2011)   Assessment: HR elevated   Plan: give more BB and lasix  UTI (lower urinary tract infection) (10/02/2011)   Assessment: on rocephin   Plan: cont same abx   Acute pulmonary edema Assessment: d/t to IVF and cardiac dysfunction Plan F/u bnp, echo, give lasix   Best practices / Disposition: -->ICU status under PCCM -->full code -->Heparin for DVT Px -->Protonix for GI Px -->diet  NPO   The patient is critically ill with multiple organ systems failure and requires high complexity decision making for assessment and support, frequent evaluation and titration of therapies, application of advanced monitoring technologies and extensive interpretation of multiple databases. Critical Care Time devoted to patient care services described in this note is 40 minutes.  Shan Levans  M.D. Pulmonary and Critical Care Medicine [facility] Cell: [phone]   Pager: [phone] 10/04/2011, 10:44 AM

## 2011-10-04 NOTE — Progress Notes (Signed)
Patient ID: [name], male   DOB: [birth year], 78 y.o.   MRN: [id number] Stroke Team Progress Note  HISTORY [name] is an 78 y.o. male history of alcohol abuse, atrial fibrillation on Coumadin diabetes mellitus, hypertension, hyperlipidemia and bladder cancer, presenting with new onset marked speech difficulty with nonsensical speech output with frequent errors. Patient has not demonstrated any difficulty with understanding what was being said to him. His previous history of left frontal stroke. CT scan of his head showed a 6 mm left frontal subdural hematoma without mass effect. Patient has a pacemaker and could not undergo evaluation with MRI. His NIH stroke score was 6. Patient was not a candidate for TPA because he was out of it time window for treatment at the time he presented to the emergency room.  SUBJECTIVE Patient with some agitation overnight, now restrained with sitter at bedside. Speech is unchanged today.  OBJECTIVE Most recent Vital Signs: Temp: 97.9 F (36.6 C) (04/14 0700) Temp src: Oral (04/14 0700) BP: 104/61 mmHg (04/14 0700) Pulse Rate: 94  (04/14 0600) Respiratory Rate: 49 O2 Saturation: 97%  CBG (last 3)   Basename 10/04/11 0751 10/03/11 2201 10/03/11 1655  GLUCAP 121* 117* 104*   Intake/Output from previous day: 04/13 0701 - 04/14 0700 In: -  Out: 925 [Urine:925]  IV Fluid Intake:      . sodium chloride 75 mL/hr at 10/03/11 0700   Medications     . atorvastatin  40 mg Oral Daily  . cefTRIAXone (ROCEPHIN) IVPB 1 gram/50 mL D5W  1 g Intravenous Q24H  . chlordiazePOXIDE  25 mg Oral TID  . digoxin  125 mcg Oral Daily  . folic acid  1 mg Oral Daily  . insulin aspart  0-9 Units Subcutaneous TID WC  . LORazepam  0-4 mg Intravenous Q6H   Followed by  . LORazepam  0-4 mg Intravenous Q12H  . LORazepam  1 mg Intravenous Once  . LORazepam  2 mg Intravenous Once  . LORazepam  2 mg Intravenous Once  . metoprolol succinate  25 mg Oral Daily  .  mulitivitamin with minerals  1 tablet Oral Daily  . pantoprazole  40 mg Oral Daily  . ramipril  10 mg Oral Daily  . thiamine  100 mg Oral Daily  . DISCONTD: insulin aspart  0-9 Units Subcutaneous Q4H  . DISCONTD: thiamine  100 mg Intravenous Daily  PRN acetaminophen, LORazepam, LORazepam, DISCONTD: LORazepam, DISCONTD: LORazepam, DISCONTD: LORazepam, DISCONTD: LORazepam  Diet:  Carb Control  Activity:  Bathroom privileges; Up with assistance; DVT Prophylaxis:  SCDs due to SDH  Studies: CBC    Component Value Date/Time   WBC 5.5 10/02/2011 2044   WBC 5.9 05/19/2011 1548   RBC 3.71* 10/02/2011 2044   RBC 4.17* 05/19/2011 1548   HGB 13.3 10/02/2011 2044   HGB 14.5 05/19/2011 1548   HCT 37.4* 10/02/2011 2044   HCT 41.2 05/19/2011 1548   PLT 125* 10/02/2011 2044   PLT 180 05/19/2011 1548   MCV 100.8* 10/02/2011 2044   MCV 98.8* 05/19/2011 1548   MCH 35.8* 10/02/2011 2044   MCH 34.8* 05/19/2011 1548   MCHC 35.6 10/02/2011 2044   MCHC 35.2 05/19/2011 1548   RDW 13.4 10/02/2011 2044   RDW 14.3 05/19/2011 1548   LYMPHSABS 0.6* 10/02/2011 2044   LYMPHSABS 1.7 05/19/2011 1548   MONOABS 0.3 10/02/2011 2044   MONOABS 0.6 05/19/2011 1548   EOSABS 0.0 10/02/2011 2044   EOSABS 0.1 05/19/2011 1548  BASOSABS 0.0 10/02/2011 2044   BASOSABS 0.0 05/19/2011 1548   CMP    Component Value Date/Time   NA 135 10/02/2011 2044   K 3.5 10/02/2011 2044   CL 93* 10/02/2011 2044   CO2 25 10/02/2011 2044   GLUCOSE 125* 10/02/2011 2044   BUN 13 10/02/2011 2044   CREATININE 0.74 10/02/2011 2044   CALCIUM 8.4 10/02/2011 2044   PROT 6.8 10/02/2011 2044   ALBUMIN 3.6 10/02/2011 2044   AST 48* 10/02/2011 2044   ALT 21 10/02/2011 2044   ALKPHOS 113 10/02/2011 2044   BILITOT 2.5* 10/02/2011 2044   GFRNONAA 85* 10/02/2011 2044   GFRAA >90 10/02/2011 2044   COAGS Lab Results  Component Value Date   INR 1.13 10/02/2011   INR 1.1 01/11/2007   Lipid Panel    Component Value Date/Time   CHOL 115 10/03/2011 0600   TRIG 63  10/03/2011 0600   HDL 55 10/03/2011 0600   CHOLHDL 2.1 10/03/2011 0600   VLDL 13 10/03/2011 0600   LDLCALC 47 10/03/2011 0600   HgbA1C  Lab Results  Component Value Date   HGBA1C 5.6 10/03/2011   Urine Drug Screen    Component Value Date/Time   LABOPIA POSITIVE* 10/02/2011 2007   COCAINSCRNUR NONE DETECTED 10/02/2011 2007   LABBENZ NONE DETECTED 10/02/2011 2007   AMPHETMU NONE DETECTED 10/02/2011 2007   THCU NONE DETECTED 10/02/2011 2007   LABBARB NONE DETECTED 10/02/2011 2007    Alcohol Level    Component Value Date/Time   [facility] <11 10/02/2011 2044     Results for orders placed during the hospital encounter of 10/02/11 (from the past 24 hour(s))  GLUCOSE, CAPILLARY     Status: Normal   Collection Time   10/03/11  8:15 AM      Component Value Range   Glucose-Capillary 91  70 - 99 (mg/dL)   Comment 1 Notify RN    GLUCOSE, CAPILLARY     Status: Normal   Collection Time   10/03/11 12:11 PM      Component Value Range   Glucose-Capillary 93  70 - 99 (mg/dL)   Comment 1 Notify RN    GLUCOSE, CAPILLARY     Status: Abnormal   Collection Time   10/03/11  4:55 PM      Component Value Range   Glucose-Capillary 104 (*) 70 - 99 (mg/dL)  GLUCOSE, CAPILLARY     Status: Abnormal   Collection Time   10/03/11 10:01 PM      Component Value Range   Glucose-Capillary 117 (*) 70 - 99 (mg/dL)   Comment 1 Documented in Chart     Comment 2 Notify RN    GLUCOSE, CAPILLARY     Status: Abnormal   Collection Time   10/04/11  7:51 AM      Component Value Range   Glucose-Capillary 121 (*) 70 - 99 (mg/dL)   Comment 1 Notify RN      Dg Chest 1 View  10/02/2011  *RADIOLOGY REPORT*  Clinical Data: Altered mental status  CHEST - 1 VIEW  Comparison:  Chest radiograph 08/24/2010  Findings: Left-sided pacemaker overlies stable cardiac silhouette. Right port in place.  There is increased central venous congestion compared to prior.  No focal consolidation.  No pneumothorax. Degenerative osteophytosis of the thoracic  spine.  IMPRESSION: Increase central venous pulmonary congestion.  Mild interstitial edema.  Original Report Authenticated By: Genevive Bi, M.D.   Ct Head Wo Contrast  10/03/2011  *RADIOLOGY REPORT*  Clinical  Data: Follow-up subdural hematoma.  CT HEAD WITHOUT CONTRAST  Technique:  Contiguous axial images were obtained from the base of the skull through the vertex without contrast.  Comparison: 10/02/2011  Findings: Again noted is a left frontal subdural hematoma and a left parietal-occipital subdural hematoma.  The size and distribution of the hematomas have not significantly changed. There is probably a small amount of blood along the left posterior falx which is unchanged.  Again noted is cerebral atrophy and evidence for encephalomalacia in the lower left frontal lobe. There is no significant midline shift or hydrocephalus.  Stable opacification and probably fluid involving the right mastoid air cells.  There is mild mucosal disease in the left ethmoid air cells.  Mild mucosal disease in the left maxillary sinus.  No acute bony abnormality.  IMPRESSION: Stable size and appearance of the left subdural hematomas.  No significant mass effect or hydrocephalus.  Stable opacification of the right mastoid air cells as described.  Cerebral atrophy and evidence for encephalomalacia in the left frontal lobe.  Original Report Authenticated By: Richarda Overlie, M.D.   Ct Head Wo Contrast  10/02/2011  *RADIOLOGY REPORT*  Clinical Data: 77 year old male with multiple falls and altered mental status.  CT HEAD WITHOUT CONTRAST  Technique:  Contiguous axial images were obtained from the base of the skull through the vertex without contrast.  Comparison: 08/22/2011  Findings: A left frontoparietal subdural hematoma is identified, measuring 6 mm in the frontal region and 4 mm in the posterior parietal region.  There is no evidence of midline shift or mass effect.  Atrophy, mild chronic small vessel white matter ischemic  changes and a remote left frontal infarct are present.  There is no evidence of hydrocephalus, acute infarction or mass lesion.  There is no evidence of acute fracture. A right mastoid effusion and fluid in the right middle ear again noted.  IMPRESSION: Left frontoparietal subdural hematoma without evidence of mass effect or midline shift.  This measures 6 mm maximally in the frontal region.  Atrophy, chronic small vessel white matter ischemic changes and remote left frontal infarct.  Unchanged right mastoid effusion and fluid in the right middle ear.  Critical Value/emergent results were called by telephone at the time of interpretation on 10/02/2011  at 8:55 p.m.  to  Dr. Radford Pax, who verbally acknowledged these results.  Original Report Authenticated By: Rosendo Gros, M.D.   MRI of the brain  Unable to obtain due pacer.  2D Echocardiogram  Performed with results pending.  Carotid Doppler/TCD  Prelim with normal appearance of carotids and good flow in verts.  Physical Exam   GENERAL:   Well nourished, well hydrated, no acute distress.   CARDIOVASCULAR:   Irregularly irregular, no thrills or palpable murmurs, S1, S2, no murmur, no rubs or gallops.      Carotid arteries: No carotid bruits.   MENTAL STATUS EXAM:    Orientation:  Alert, mildly agitated.      Language:  Speech is mildly dysarthric; he has some word finding difficulty but otherwise intact speech now.   CRANIAL NERVES:     CN 2 (Optic):  R homonymous hemianopsia.     CN 3,4,6 (EOM):  Pupils equal and reactive to light and near full eye movement without nystagmus.      CN 7 (Facial):  No facial weakness or asymmetry.      CN 8 (Auditory):  Auditory acuity grossly normal.      CN 12 (Hypoglossal):  The tongue  is midline. No atrophy or fasciculations.   MOTOR:    Deltoids:            (R): 5  (L): 5      Biceps:                       (R): 5  (L): 5      Triceps:                       (R): 5  (L): 5      Wrist Extensors:         (R):  5  (L): 5      Wrist Flexors:      (R): 5  (L): 5      Hip Flexors:                       (R): 5  (L): 5      Quadriceps:                       (R): 5  (L): 5      Hamstrings:                       (R): 5  (L): 5      Tibialis Anterior:                 (R): 5  (L): 5      Medial Gastrocnemius:     (R): 5  (L): 5  Muscle Tone: Tone and muscle bulk are normal in the upper and lower extremities.  REFLEXES:     Triceps:                 (R): 2+  (L): 2+      Biceps:                  (R): 2+  (L): 2+      Brachioradialis:     (R): 2+  (L): 2+      Patellar:                 (R): 2+  (L): 2+      Achilles:                 (R): 2+  (L): 2+      Babinski:    (R): absent  (L): absent   COORDINATION:     Not following instructions today.  SENSATION:     Intact to light touch.   GAIT:     Deferred due to restraints and agitation.  ASSESSMENT 78 y.o. male presenting with new onset moderately severe expressive aphasia as well as right visual field defect. Although patient has a small left frontal subdural hematoma, it is likely that he has experienced a recurrent acute stroke involving the left middle cerebral artery territory. He is unable to get an MRI due to his pacer. Symptoms appear improved today but still not back to baseline.  Stroke Risk Factors - atrial fibrillation, diabetes mellitus, hyperlipidemia and hypertension  Hospital day # 2  TREATMENT/PLAN -Would recommend long-term treatment with aspirin and Plavix. Will hold these due to acute SDH until this appears isodense. -Patient is not a good candidate for anticoagulation due to his excessive EtOH use and frequent falls. -Awaiting final results from TTE and CUS. -DVT ppx with SCDs due  to SDH. -Primary team treating for EtOH withdrawal.  Kipp Laurence, MD [facility] Pager: [phone] 10/04/2011 8:04 AM

## 2011-10-04 NOTE — Progress Notes (Signed)
CSW received referral.  Will f/u when Pt is less agitated and able to participate in interview. Milus Banister MSW,LCSW w/e Coverage [phone]

## 2011-10-04 NOTE — Significant Event (Signed)
Subjective: Interval History: continues agitated after scheduled Ativan and extra prn doses.  Objective: Vital signs in last 24 hours: Temp:  [98.1 F (36.7 C)-99.3 F (37.4 C)] 98.7 F (37.1 C) (04/13 2343) Pulse Rate:  [69-113] 113  (04/14 0430) Resp:  [18-49] 49  (04/14 0300) BP: (113-154)/(60-92) 113/92 mmHg (04/14 0300) SpO2:  [97 %-99 %] 97 % (04/13 2343)  Intake/Output from previous day: 04/13 0701 - 04/14 0700 In: -  Out: 925 [Urine:925] Intake/Output this shift: Total I/O In: -  Out: 750 [Urine:750]  BP 113/92  Pulse 94  Temp(Src) 98.7 F (37.1 C) (Oral)  Resp 49  Ht 6\' 1"  (1.854 m)  Wt 80.5 kg (177 lb 7.5 oz)  BMI 23.41 kg/m2  SpO2 97% General appearance: combative and uncooperative Lungs: clear to auscultation bilaterally Heart: tachycardic Neurologic: Mental status: agitated, confused, trying to get out of bed  Results for orders placed during the hospital encounter of 10/02/11 (from the past 24 hour(s))  GLUCOSE, CAPILLARY     Status: Normal   Collection Time   10/03/11  8:15 AM      Component Value Range   Glucose-Capillary 91  70 - 99 (mg/dL)   Comment 1 Notify RN    GLUCOSE, CAPILLARY     Status: Normal   Collection Time   10/03/11 12:11 PM      Component Value Range   Glucose-Capillary 93  70 - 99 (mg/dL)   Comment 1 Notify RN    GLUCOSE, CAPILLARY     Status: Abnormal   Collection Time   10/03/11  4:55 PM      Component Value Range   Glucose-Capillary 104 (*) 70 - 99 (mg/dL)  GLUCOSE, CAPILLARY     Status: Abnormal   Collection Time   10/03/11 10:01 PM      Component Value Range   Glucose-Capillary 117 (*) 70 - 99 (mg/dL)   Comment 1 Documented in Chart     Comment 2 Notify RN      Studies/Results: Dg Chest 1 View  10/02/2011  *RADIOLOGY REPORT*  Clinical Data: Altered mental status  CHEST - 1 VIEW  Comparison:  Chest radiograph 08/24/2010  Findings: Left-sided pacemaker overlies stable cardiac silhouette. Right port in place.  There is  increased central venous congestion compared to prior.  No focal consolidation.  No pneumothorax. Degenerative osteophytosis of the thoracic spine.  IMPRESSION: Increase central venous pulmonary congestion.  Mild interstitial edema.  Original Report Authenticated By: Genevive Bi, M.D.   Ct Head Wo Contrast  10/03/2011  *RADIOLOGY REPORT*  Clinical Data: Follow-up subdural hematoma.  CT HEAD WITHOUT CONTRAST  Technique:  Contiguous axial images were obtained from the base of the skull through the vertex without contrast.  Comparison: 10/02/2011  Findings: Again noted is a left frontal subdural hematoma and a left parietal-occipital subdural hematoma.  The size and distribution of the hematomas have not significantly changed. There is probably a small amount of blood along the left posterior falx which is unchanged.  Again noted is cerebral atrophy and evidence for encephalomalacia in the lower left frontal lobe. There is no significant midline shift or hydrocephalus.  Stable opacification and probably fluid involving the right mastoid air cells.  There is mild mucosal disease in the left ethmoid air cells.  Mild mucosal disease in the left maxillary sinus.  No acute bony abnormality.  IMPRESSION: Stable size and appearance of the left subdural hematomas.  No significant mass effect or hydrocephalus.  Stable opacification of  the right mastoid air cells as described.  Cerebral atrophy and evidence for encephalomalacia in the left frontal lobe.  Original Report Authenticated By: Richarda Overlie, M.D.   Ct Head Wo Contrast  10/02/2011  *RADIOLOGY REPORT*  Clinical Data: 78 year old male with multiple falls and altered mental status.  CT HEAD WITHOUT CONTRAST  Technique:  Contiguous axial images were obtained from the base of the skull through the vertex without contrast.  Comparison: 08/22/2011  Findings: A left frontoparietal subdural hematoma is identified, measuring 6 mm in the frontal region and 4 mm in the  posterior parietal region.  There is no evidence of midline shift or mass effect.  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  There is no evidence of hydrocephalus, acute infarction or mass lesion.  There is no evidence of acute fracture. A right mastoid effusion and fluid in the right middle ear again noted.  IMPRESSION: Left frontoparietal subdural hematoma without evidence of mass effect or midline shift.  This measures 6 mm maximally in the frontal region.  Atrophy, chronic small vessel white matter ischemic changes and remote left frontal infarct.  Unchanged right mastoid effusion and fluid in the right middle ear.  Critical Value/emergent results were called by telephone at the time of interpretation on 10/02/2011  at 8:55 p.m.  to  Dr. Radford Pax, who verbally acknowledged these results.  Original Report Authenticated By: Rosendo Gros, M.D.    Scheduled Meds:   . atorvastatin  40 mg Oral Daily  . cefTRIAXone (ROCEPHIN) IVPB 1 gram/50 mL D5W  1 g Intravenous Q24H  . digoxin  125 mcg Oral Daily  . folic acid  1 mg Oral Daily  . insulin aspart  0-9 Units Subcutaneous TID WC  . LORazepam  0-4 mg Intravenous Q6H   Followed by  . LORazepam  0-4 mg Intravenous Q12H  . LORazepam  1 mg Intravenous Once  . LORazepam  2 mg Intravenous Once  . LORazepam  2 mg Intravenous Once  . metoprolol succinate  25 mg Oral Daily  . mulitivitamin with minerals  1 tablet Oral Daily  . pantoprazole  40 mg Oral Daily  . ramipril  10 mg Oral Daily  . thiamine  100 mg Oral Daily  . DISCONTD: insulin aspart  0-9 Units Subcutaneous Q4H  . DISCONTD: thiamine  100 mg Intravenous Daily   Continuous Infusions:   . sodium chloride 75 mL/hr at 10/03/11 0700   PRN Meds:acetaminophen, LORazepam, LORazepam, DISCONTD: LORazepam, DISCONTD: LORazepam, DISCONTD: LORazepam, DISCONTD: LORazepam  Assessment/Plan: Alcohol withdrawal: patient restless, trying to  get out of bed. He has had at total of 10 mg of Ativan since 8pm on 10/03/2011. Continue Ativan per CIWA protocol with prn every 2 hours as needed. Added schedule Librium if patient will take po medications.   LOS: 2 days   Olukemi Panchal A.

## 2011-10-04 NOTE — Progress Notes (Signed)
Pt verbally abusive, agitated, CIWA score 24; M. Lynch, NP notified; scheduled Lorazepam of 4 mg given per verbal order; will continue to monitor

## 2011-10-05 ENCOUNTER — Inpatient Hospital Stay (HOSPITAL_COMMUNITY): Payer: Medicare Other

## 2011-10-05 DIAGNOSIS — R4789 Other speech disturbances: Secondary | ICD-10-CM

## 2011-10-05 LAB — GLUCOSE, CAPILLARY

## 2011-10-05 LAB — CBC
MCH: 35.2 pg — ABNORMAL HIGH (ref 26.0–34.0)
MCHC: 35 g/dL (ref 30.0–36.0)
Platelets: 106 10*3/uL — ABNORMAL LOW (ref 150–400)
RBC: 3.35 MIL/uL — ABNORMAL LOW (ref 4.22–5.81)
RDW: 13.3 % (ref 11.5–15.5)

## 2011-10-05 LAB — COMPREHENSIVE METABOLIC PANEL
ALT: 104 U/L — ABNORMAL HIGH (ref 0–53)
AST: 338 U/L — ABNORMAL HIGH (ref 0–37)
Alkaline Phosphatase: 91 U/L (ref 39–117)
CO2: 25 mEq/L (ref 19–32)
Chloride: 99 mEq/L (ref 96–112)
Creatinine, Ser: 0.99 mg/dL (ref 0.50–1.35)
GFR calc non Af Amer: 76 mL/min — ABNORMAL LOW (ref >90)
Potassium: 3 mEq/L — ABNORMAL LOW (ref 3.5–5.1)
Total Bilirubin: 1.8 mg/dL — ABNORMAL HIGH (ref 0.3–1.2)

## 2011-10-05 MED ORDER — IOHEXOL 350 MG/ML SOLN
80.0000 mL | Freq: Once | INTRAVENOUS | Status: AC | PRN
  Administered 2011-10-05: 80 mL via INTRAVENOUS

## 2011-10-05 MED ORDER — POTASSIUM CHLORIDE 10 MEQ/100ML IV SOLN
10.0000 meq | INTRAVENOUS | Status: DC
  Filled 2011-10-05: qty 100

## 2011-10-05 MED ORDER — POTASSIUM CHLORIDE 10 MEQ/50ML IV SOLN
INTRAVENOUS | Status: AC
  Administered 2011-10-05: 10 meq via INTRAVENOUS
  Filled 2011-10-05: qty 50

## 2011-10-05 MED ORDER — POTASSIUM CHLORIDE CRYS ER 20 MEQ PO TBCR
EXTENDED_RELEASE_TABLET | ORAL | Status: AC
  Administered 2011-10-05: 40 meq
  Filled 2011-10-05: qty 2

## 2011-10-05 MED ORDER — PHENOL 1.4 % MT LIQD
1.0000 | OROMUCOSAL | Status: DC | PRN
  Filled 2011-10-05: qty 177

## 2011-10-05 MED ORDER — INSULIN ASPART 100 UNIT/ML ~~LOC~~ SOLN
0.0000 [IU] | Freq: Three times a day (TID) | SUBCUTANEOUS | Status: DC
  Administered 2011-10-05: 1 [IU] via SUBCUTANEOUS
  Administered 2011-10-06: 2 [IU] via SUBCUTANEOUS
  Administered 2011-10-07: 1 [IU] via SUBCUTANEOUS
  Administered 2011-10-08: 2 [IU] via SUBCUTANEOUS
  Administered 2011-10-08 – 2011-10-09 (×2): 1 [IU] via SUBCUTANEOUS

## 2011-10-05 MED ORDER — POTASSIUM CHLORIDE 10 MEQ/50ML IV SOLN
10.0000 meq | INTRAVENOUS | Status: AC
  Administered 2011-10-05 (×3): 10 meq via INTRAVENOUS
  Filled 2011-10-05 (×3): qty 50

## 2011-10-05 NOTE — Progress Notes (Signed)
  Echocardiogram 2D Echocardiogram has been performed.  Cathie Beams Deneen 10/05/2011, 1:06 PM

## 2011-10-05 NOTE — Evaluation (Signed)
Speech Language Pathology Evaluation Patient Details Name: ZERIC BARANOWSKI MRN: [id number] DOB: 12-03-1929 Today's Date: 10/05/2011  Problem List:  Patient Active Problem List  Diagnoses  . Primary mucoepidermoid carcinoma of parotid gland  . Bladder cancer  . Elevated PSA  . Pacemaker  . Diabetes mellitus  . Hypertension  . Dyslipidemia  . Cancer  . Subdural hematoma  . ETOH abuse  . Atrial fibrillation  . UTI (lower urinary tract infection)  . TIA (transient ischemic attack)  . Acute pulmonary edema  . Alcohol withdrawal delirium   Past Medical History:  Past Medical History  Diagnosis Date  . Atrial fibrillation 1990  . Dyslipidemia   . Diabetes mellitus   . Prostate hypertrophy   . Arthritis     degenerative-knees  . Bladder cancer 2010  . 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 History of radiation therapy 05/05/07-06/15/07    right parotid through subclavicular region/ mid jugularlymph node chain  . Allergy   . Hypertension     labile  . Heart murmur     2/6 systolic  . History of chemotherapy      Hx carboplatin/3fu  . Xerostomia     limited   Past Surgical History:  Past Surgical History  Procedure Date  . Pacemaker insertion 1990  . Cystoscopy 01/2011    neg    SLP Assessment/Plan/Recommendation  Patient presents with cognitive deficts primarily impacting higher level problem solving, judgement, and safety awareness. Suspect that cognition has improved some since evaluation initially ordered. SLP will continue to f/u for improvements in cognitive function and potential needs after d/c.   SLP Recommendation/Assessment: Patient will need skilled Speech Lanaguage Pathology Services in the acute care venue to address identified deficits Problem List: Attention;Problem Solving;Reasoning;Executive Functioning Therapy Diagnosis: Cognitive Impairments Plan Speech Therapy Frequency: min 2x/week Duration: 2 weeks Treatment/Interventions: Environmental  controls;Cueing hierarchy;Cognitive reorganization;Internal/external aids;Functional tasks;SLP instruction and feedback;Compensatory strategies;Patient/family education Potential to Achieve Goals: Good SLP Recommendations Follow up Recommendations: Other (comment) (none for swallowing function. to be determined for cognition) Equipment Recommended: Defer to next venue  SLP Goals  SLP Goals Potential to Achieve Goals: Good SLP Goal #1: Pt will demonstrate intact problem solving for mildly complex situations with min assist SLP Goal #1 - Progress: Met SLP Goal #2: Patient will demonstrate intact safety awareness during functional ADLs with min assist.  SLP Goal #2 - Progress: Not met  Ferdinand Lango MA, CCC-SLP [phone]  Ferdinand Lango Meryl 10/05/2011, 4:22 PM

## 2011-10-05 NOTE — Progress Notes (Signed)
Physical Therapy Treatment Patient Details Name: [name] MRN: [id number] DOB: Feb 07, 1930 Today's Date: 10/05/2011  PT Assessment/Plan  PT - Assessment/Plan Comments on Treatment Session: Pt ambulated about 30 feet with VC to stand closer to walker.  At that time, he began to have an abnormal cardiac rhythm, as per nursing.  Therefore, we had him sit in the chair to return to the room. PT Plan: Discharge plan needs to be updated;Frequency needs to be updated PT Frequency: Min 3X/week Follow Up Recommendations: Skilled nursing facility;Supervision/Assistance - 24 hour Equipment Recommended: Defer to next venue PT Goals  Acute Rehab PT Goals PT Goal Formulation: With patient/family Time For Goal Achievement: 2 weeks Pt will go Supine/Side to Sit: with supervision PT Goal: Supine/Side to Sit - Progress: Goal set today Pt will go Sit to Supine/Side: with modified independence PT Goal: Sit to Supine/Side - Progress: Goal set today Pt will go Sit to Stand: with modified independence PT Goal: Sit to Stand - Progress: Progressing toward goal Pt will go Stand to Sit: with modified independence PT Goal: Stand to Sit - Progress: Progressing toward goal Pt will Ambulate: >150 feet;with supervision;with least restrictive assistive device PT Goal: Ambulate - Progress: Progressing toward goal  PT Treatment Precautions/Restrictions  Precautions Precautions: Fall Restrictions Weight Bearing Restrictions: No Mobility (including Balance) Bed Mobility Supine to Sit: 1: +2 Total assist;Patient percentage (comment) (pt = 50%) Supine to Sit Details (indicate cue type and reason): Pt was able to attain sidelying with min assist.  However he required hand held assist and assist at the shoulders to attain sitting at EOB. Sitting - Scoot to Edge of Bed: 4: Min assist Sitting - Scoot to [place] of Bed Details (indicate cue type and reason): Pt required min assist to maintain balance and scoot R hip to  EOB. Transfers Transfers: Yes Sit to Stand: 1: +2 Total assist;Patient percentage (comment);From bed;With upper extremity assist (Pt = 40%) Sit to Stand Details (indicate cue type and reason): Pt required one person assist to initiate trasnfer but then required additional assistance to ontain upright standing position due to LE weakness.  Pt required verbal and manual cues for hand placement with RW. Stand to Sit: 1: +2 Total assist;Patient percentage (comment);To chair/3-in-1;With upper extremity assist;With armrests (Pt = 50%) Stand to Sit Details: Pt required +2 assist in order to control descent into chair.  Pt required verbal and manual cues for hand placement with RW. Ambulation/Gait Ambulation/Gait: Yes Ambulation/Gait Assistance: 1: +2 Total assist;Patient percentage (comment) (pt = 75%) Ambulation/Gait Assistance Details (indicate cue type and reason): Pt required +2 assist for safety due to LE weakness and imbalance.   Ambulation Distance (Feet): 30 Feet Assistive device: Rolling walker Gait Pattern: Step-to pattern;Decreased stride length;Decreased hip/knee flexion - right;Decreased hip/knee flexion - left;Trunk flexed Gait velocity: decreased gait speed Stairs: No  Posture/Postural Control Posture/Postural Control: No significant limitations Balance Balance Assessed: No Exercise    End of Session PT - End of Session Equipment Utilized During Treatment: Gait belt Activity Tolerance: Treatment limited secondary to medical complications (Comment) (Abnormal cardiac rhythm) Patient left: in chair;with call bell in reach;Other (comment) (With sitter) General Behavior During Session: [facility] for tasks performed Cognitive Impairment: Decreased safety awareness and awareness of deficits.  Slow to process information and cueing.  Ezzard Standing SPT 10/05/2011, 12:48 PM

## 2011-10-05 NOTE — Evaluation (Signed)
Occupational Therapy Evaluation Patient Details Name: [name] MRN: [id number] DOB: 1930-03-05 Today's Date: 10/05/2011  Problem List:  Patient Active Problem List  Diagnoses  . Primary mucoepidermoid carcinoma of parotid gland  . Bladder cancer  . Elevated PSA  . Pacemaker  . Diabetes mellitus  . Hypertension  . Dyslipidemia  . Cancer  . Subdural hematoma  . ETOH abuse  . Atrial fibrillation  . UTI (lower urinary tract infection)  . TIA (transient ischemic attack)  . Acute pulmonary edema  . Alcohol withdrawal delirium    Past Medical History:  Past Medical History  Diagnosis Date  . Atrial fibrillation 1990  . Dyslipidemia   . Diabetes mellitus   . Prostate hypertrophy   . Arthritis     degenerative-knees  . Bladder cancer 2010  . 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 History of radiation therapy 05/05/07-06/15/07    right parotid through subclavicular region/ mid jugularlymph node chain  . Allergy   . Hypertension     labile  . Heart murmur     2/6 systolic  . History of chemotherapy      Hx carboplatin/45fu  . Xerostomia     limited   Past Surgical History:  Past Surgical History  Procedure Date  . Pacemaker insertion 1990  . Cystoscopy 01/2011    neg    OT Assessment/Plan/Recommendation OT Assessment Clinical Impression Statement: Pt admitted with fall and SDH and presents with below problem list. Will benefit from skilled OT in the acute setting to maximize I with ADL and ADL mobility to Min-I level upon d/c OT Recommendation/Assessment: Patient will need skilled OT in the acute care venue OT Problem List: Decreased strength;Decreased activity tolerance;Impaired balance (sitting and/or standing);Decreased knowledge of use of DME or AE;Cardiopulmonary status limiting activity OT Therapy Diagnosis : Generalized weakness OT Plan OT Frequency: Min 1X/week OT Treatment/Interventions: Self-care/ADL training;DME and/or AE instruction;Balance  training;Patient/family education;Therapeutic activities OT Recommendation Follow Up Recommendations: Skilled nursing facility Equipment Recommended: Defer to next venue Individuals Consulted Consulted and Agree with Results and Recommendations: Patient OT Goals Acute Rehab OT Goals OT Goal Formulation: With patient Time For Goal Achievement: 2 weeks ADL Goals Pt Will Perform Grooming: with modified independence;Standing at sink ADL Goal: Grooming - Progress: Goal set today Pt Will Perform Upper Body Dressing: with modified independence;Independently;Sitting, bed;Sitting, chair ADL Goal: Upper Body Dressing - Progress: Goal set today Pt Will Perform Lower Body Dressing: with supervision;Sit to stand from bed;Sit to stand from chair ADL Goal: Lower Body Dressing - Progress: Goal set today Pt Will Transfer to Toilet: with modified independence;Ambulation;with DME;3-in-1 ADL Goal: Toilet Transfer - Progress: Goal set today Pt Will Perform Toileting - Clothing Manipulation: Standing;with modified independence ADL Goal: Toileting - Clothing Manipulation - Progress: Goal set today Pt Will Perform Toileting - Hygiene: with modified independence;Standing at 3-in-1/toilet;Sit to stand from 3-in-1/toilet ADL Goal: Toileting - Hygiene - Progress: Goal set today Pt Will Perform Tub/Shower Transfer: Tub transfer;with min assist;Ambulation;with DME ADL Goal: Tub/Shower Transfer - Progress: Goal set today Additional ADL Goal #1: Pt will be I with bed mobility as precursor to ADLs ADL Goal: Additional Goal #1 - Progress: Goal set today  OT Evaluation Precautions/Restrictions  Precautions Precautions: Fall Restrictions Weight Bearing Restrictions: No Prior Functioning Home Living Lives With: Daughter Available Help at Discharge: Family Type of Home: House Home Access: Stairs to enter Secretary/administrator of Steps: 2 Entrance Stairs-Rails: Right Home Layout: One level Bathroom Shower/Tub:  Tub/shower unit;Curtain Bathroom  Toilet: Handicapped height Bathroom Accessibility: Yes How Accessible: Accessible via walker Home Adaptive Equipment: Walker - rolling;Straight cane Prior Function Level of Independence: Independent Able to Take Stairs?: Yes Driving: Yes Vocation: Retired  ADL ADL Eating/Feeding: Performed;Set up Where Assessed - Eating/Feeding: Chair Grooming: Simulated;Minimal assistance Where Assessed - Grooming: Standing at sink Upper Body Bathing: Simulated;Minimal assistance Where Assessed - Upper Body Bathing: Sitting, bed Lower Body Bathing: Simulated;Moderate assistance Where Assessed - Lower Body Bathing: Sit to stand from bed Upper Body Dressing: Minimal assistance;Performed Upper Body Dressing Details (indicate cue type and reason): gown Where Assessed - Upper Body Dressing: Sitting, bed Lower Body Dressing: Simulated;Moderate assistance Where Assessed - Lower Body Dressing: Sit to stand from bed Toilet Transfer: Simulated;+2 Total assistance (pt=50%) Toilet Transfer Details (indicate cue type and reason): simulated EOB to chair- sit to stand from EOB Toileting - Clothing Manipulation: Simulated;Moderate assistance Where Assessed - Toileting Clothing Manipulation: Standing Toileting - Hygiene: Simulated;Maximal assistance Where Assessed - Toileting Hygiene: Standing Tub/Shower Transfer: Not assessed Equipment Used: Rolling walker Ambulation Related to ADLs: +2total (pt=75%) RW ambulation. Pt has pacemaker, but (per RN) pt with possible runs of V-tach with extra beats vs. artifact. Pt asymptomatic, but sat pt down and returned to room via chair.  Vision/Perception  Vision - History Patient Visual Report: No change from baseline Cognition Cognition Overall Cognitive Status: Appears within functional limits for tasks assessed Orientation Level: Oriented to person;Oriented to place Sensation/Coordination Sensation Light Touch: Appears  Intact Coordination Gross Motor Movements are Fluid and Coordinated: Yes Fine Motor Movements are Fluid and Coordinated: Yes Extremity Assessment RUE Assessment RUE Assessment: Within Functional Limits LUE Assessment LUE Assessment: Within Functional Limits Mobility  Bed Mobility Supine to Sit: 1: +2 Total assist (pt=50%) Supine to Sit Details (indicate cue type and reason): Pt was able to attain sidelying with min assist.  However he required hand held assist and assist at the shoulders to attain sitting at EOB. Sitting - Scoot to Edge of Bed: 4: Min assist Sitting - Scoot to [place] of Bed Details (indicate cue type and reason): Pt required min assist to maintain balance and scoot R hip to EOB. Transfers Sit to Stand: 1: +2 Total assist (pt=40%) Sit to Stand Details (indicate cue type and reason): Pt required one person assist to initiate trasnfer but then required additional assistance to ontain upright standing position due to LE weakness.  Pt required verbal and manual cues for hand placement with RW. Stand to Sit: 1: +2 Total assist (pt=50%) Stand to Sit Details: Pt required +2 assist in order to control descent into chair.  Pt required verbal and manual cues for hand placement with RW. End of Session OT - End of Session Equipment Utilized During Treatment: Gait belt Activity Tolerance: Patient tolerated treatment well (limited by cardio status) General Behavior During Session: [facility] for tasks performed Cognitive Impairment: Decreased safety awareness and awareness of deficits.  Slow to process information and cueing.   Tawsha Terrero 10/05/2011, 1:05 PM

## 2011-10-05 NOTE — Progress Notes (Signed)
[name], PT [phone]  

## 2011-10-05 NOTE — Discharge Instructions (Signed)
STROKE/TIA DISCHARGE INSTRUCTIONS SMOKING Cigarette smoking nearly doubles your risk of having a stroke & is the single most alterable risk factor  If you smoke or have smoked in the last 12 months, you are advised to quit smoking for your health.  Most of the excess cardiovascular risk related to smoking disappears within a year of stopping.  Ask you doctor about anti-smoking medications  [place] Quit Line: 1-800-QUIT NOW  Free Smoking Cessation Classes ([phone]  CHOLESTEROL Know your levels; limit fat & cholesterol in your diet  Lipid Panel     Component Value Date/Time   CHOL 115 10/03/2011 0600   TRIG 63 10/03/2011 0600   HDL 55 10/03/2011 0600   CHOLHDL 2.1 10/03/2011 0600   VLDL 13 10/03/2011 0600   LDLCALC 47 10/03/2011 0600      Many patients benefit from treatment even if their cholesterol is at goal.  Goal: Total Cholesterol (CHOL) less than 160  Goal:  Triglycerides (TRIG) less than 150  Goal:  HDL greater than 40  Goal:  LDL (LDLCALC) less than 100   BLOOD PRESSURE American Stroke Association blood pressure target is less that 120/80 mm/Hg  Your discharge blood pressure is:  BP: 143/83 mmHg  Monitor your blood pressure  Limit your salt and alcohol intake  Many individuals will require more than one medication for high blood pressure  DIABETES (A1c is a blood sugar average for last 3 months) Goal HGBA1c is under 7% (HBGA1c is blood sugar average for last 3 months)  Diabetes: {STROKE DC DIABETES:22357}    Lab Results  Component Value Date   HGBA1C 5.6 10/03/2011     Your HGBA1c can be lowered with medications, healthy diet, and exercise.  Check your blood sugar as directed by your physician  Call your physician if you experience unexplained or low blood sugars.  PHYSICAL ACTIVITY/REHABILITATION Goal is 30 minutes at least 4 days per week    {STROKE DC ACTIVITY/REHAB:22359}  Activity decreases your risk of heart attack and stroke and makes your heart  stronger.  It helps control your weight and blood pressure; helps you relax and can improve your mood.  Participate in a regular exercise program.  Talk with your doctor about the best form of exercise for you (dancing, walking, swimming, cycling).  DIET/WEIGHT Goal is to maintain a healthy weight  Your discharge diet is: Full Liquid *** liquids Your height is:  Height: 6\' 1"  (185.4 cm) Your current weight is: Weight: 81.2 kg (179 lb 0.2 oz) Your Body Mass Index (BMI) is:  BMI (Calculated): 23.5   Following the type of diet specifically designed for you will help prevent another stroke.  Your goal weight range is:  ***  Your goal Body Mass Index (BMI) is 19-24.  Healthy food habits can help reduce 3 risk factors for stroke:  High cholesterol, hypertension, and excess weight.  RESOURCES Stroke/Support Group:  Call [phone]  they meet the 3rd Sunday of the month on the Rehab Unit at [facility], [place] ( no meetings June, July & Aug).  STROKE EDUCATION PROVIDED/REVIEWED AND GIVEN TO PATIENT Stroke warning signs and symptoms How to activate emergency medical system (call 911). Medications prescribed at discharge. Need for follow-up after discharge. Personal risk factors for stroke. Pneumonia vaccine given:   {STROKE DC YES/NO/DATE:22363} Flu vaccine given:   {STROKE DC YES/NO/DATE:22363} My questions have been answered, the writing is legible, and I understand these instructions.  I will adhere to these goals & educational materials that  have been provided to me after my discharge from the hospital.

## 2011-10-05 NOTE — Progress Notes (Signed)
UR Completed.  Lissa Rowles Jane [phone] 10/05/2011  

## 2011-10-05 NOTE — Progress Notes (Signed)
Name: [name] MRN: [id number] DOB: [birth year]    LOS: 3  PCCM ADMISSION NOTE  History of Present Illness: 78 y/o who presented to the ER for slurred speech which has resolved. He has a h/o frequent falls and was in the ER 2 wks ago with a head injury and was found incidentally on CT head to have a subdural hematoma.  PCCM called 10/03/12 for worsening agitation and ETOH WD. SDH are stable on CT 10/02/12.  Lines / Drains: none  Cultures: 4/13: blood culture: no growth so far 4/12:  urine culture: 20, 000 colonies/ml  Antibiotics: Rocephin  Tests / Events: 4/14: CXR showed Central vascular congestion without convincing pulmonary edema.   4/13: CT-head: stable SDH 4/15: ambulated   Vital Signs: Temp:  [97.1 F (36.2 C)-97.7 F (36.5 C)] 97.7 F (36.5 C) (04/15 0839) Pulse Rate:  [69-78] 69  (04/15 1000) Resp:  [15-44] 16  (04/15 1000) BP: (84-163)/(50-101) 123/73 mmHg (04/15 1000) SpO2:  [89 %-100 %] 94 % (04/15 1000) Weight:  [179 lb 0.2 oz (81.2 kg)] 179 lb 0.2 oz (81.2 kg) (04/15 0400) I/O last 3 completed shifts: In: 424 [I.V.:424] Out: 2410 [Urine:2410]  Physical Examination:  General: resting in bed, not in acute distress HEENT: PERRL, EOMI, no scleral icterus Cardiac: S1/S2, RRR, No murmurs, gallops or rubs Pulm: Good air movement bilaterally, Clear to auscultation bilaterally, No rales, wheezing, rhonchi or rubs. Abd: Soft,  nondistended, nontender, no rebound pain, no organomegaly, BS present Ext: No rashes or edema, 2+DP/PT pulse bilaterally Neuro: alert and oriented X3, cranial nerves II-XII grossly intact, muscle strength 5/5 in all extremeties,  sensation to light touch intact.   Ventilator settings: not on ventilator   Labs and Imaging:  Reviewed.  Please refer to the Assessment and Plan section for relevant results.  ASSESSMENT AND PLAN  NEUROLOGIC A: patient had TIA (transient ischemic attack) (10/03/2011). Neurology was consulted, suggested  Stroke Risk Factors - atrial fibrillation, diabetes mellitus, hyperlipidemia and hypertension. recommended ASA plavix, and EEG. Patient's LDL is normal. Patient is not the candidate for MRI due to pacemaker. His SDH is stable on CT  P: --> Appreciate neurology's help in managing our patient.  --> pending CTA-head and neck --> consider ASA and Plavix after further assessment on fall risk, could start with asa in time --> consider EEG -Will avoid CIWA if remains in ICU thiamine , folic, MVI Sitter  PULMONARY A: Pulmonary congestion on CXR. likely due to IVF vs. and cardiac dysfunction. currently his O2Sat is 99%.   P:   -F/u bnp, echo, follow up rt base hazzines, at risk asp  CARDIOVASCULAR  Lab 10/04/11 1331 10/03/11 0031 10/02/11 2148 10/02/11 2044  TROPONINI -- <0.30 -- <0.30  LATICACIDVEN -- -- 1.6 --  PROBNP 10316.0* -- -- --   A:  HTN. Current bp is 143/83.  On Metoprolol for A. Fib. Also on digoxin for A. Fib. Heat rate is well controled.  P: --> will observe closely. No anticoagulation with fall risk  RENAL  Lab 10/05/11 0452 10/02/11 2044  NA 137 135  K 3.0* 3.5  CL 99 93*  CO2 25 25  BUN 18 13  CREATININE 0.99 0.74  CALCIUM 7.5* 8.4  MG -- --  PHOS 4.7* --   A:  Hypokalemia.  P: -->  Replete K, lyts in am   GASTROINTESTINAL  Lab 10/05/11 0452 10/02/11 2044  AST 338* 48*  ALT 104* 21  ALKPHOS 91 113  BILITOT 1.8*  2.5*  PROT 5.8* 6.8  ALBUMIN 2.9* 3.6   A:  No issue P: advance diet, assess swallow fxn, rt base infiltrate, bilat sdh, high risk dyspahgia  HEMATOLOGIC  Lab 10/05/11 0452 10/02/11 2044  HGB 11.8* 13.3  HCT 33.7* 37.4*  PLT 106* 125*  INR -- 1.13  APTT -- --   A:  slightly decreased Hgb, likely ICU-related. P: -->  Follow up CBC in AM, scd  INFECTIOUS  Lab 10/05/11 0452 10/02/11 2044  WBC 8.7 5.5  PROCALCITON -- --   A:  Afebrile, no leukocytosis.  P: -->  No abx currently, UA unimpressive, Urine culture unimpressive,  follow clinically  ENDOCRINE  Lab 10/05/11 0346 10/04/11 2357 10/04/11 1939 10/04/11 1747 10/04/11 1543  GLUCAP 80 92 141* 158* 52*   A:  P: -->  CBG checks / SSI, to AC, HS   BEST PRACTICE / DISPOSITION -->  ICU status under PCCM -->  Full code -->  NPO -->  Heparin [place] for DVT Px -->  Protonix IV for GI Px  Lorretta Harp, MD PGY1, Internal Medicine Teaching Service Pager: [phone]   10/05/2011, 11:06 AM  To tele, back to traid  I have fully examined this patient and agree with above findings.    And edited.  Mcarthur Rossetti. Tyson Alias, MD, FACP Pgr: [phone] [facility]

## 2011-10-05 NOTE — Progress Notes (Signed)
Patient ID: [name], male   DOB: 03-28-1930, 78 y.o.   MRN: [id number] Stroke Team Progress Note  HISTORY [name] is an 78 y.o. male history of alcohol abuse, atrial fibrillation on Coumadin diabetes mellitus, hypertension, hyperlipidemia and bladder cancer, presenting with new onset marked speech difficulty with nonsensical speech output with frequent errors. Patient has not demonstrated any difficulty with understanding what was being said to him. His previous history of left frontal stroke. CT scan of his head showed a 6 mm left frontal subdural hematoma without mass effect. Patient has a pacemaker and could not undergo evaluation with MRI. His NIH stroke score was 6. Patient was not a candidate for TPA because he was out of it time window for treatment at the time he presented to the emergency room.  SUBJECTIVE Patient working with PT currently. Reported stroke sx are improved.Speech is back to baseline per patient. Denies any prior h/o seizures.  OBJECTIVE Most recent Vital Signs: Temp: 97.3 F (36.3 C) (04/15 0400) Temp src: Oral (04/15 0400) BP: 140/100 mmHg (04/15 0800) Pulse Rate: 69  (04/15 0800) Respiratory Rate: 18 O2 Saturation: 100%  CBG (last 3)  Basename 10/05/11 0346 10/04/11 2357 10/04/11 1939  GLUCAP 80 92 141*   Intake/Output from previous day: 04/14 0701 - 04/15 0700 In: 424 [I.V.:424] Out: 1660 [Urine:1660]  IV Fluid Intake:     . sodium chloride 20 mL/hr (10/04/11 1200)  . dexmedetomidine (PRECEDEX) IV infusion 0.1 mcg/kg/hr (10/05/11 0800)   Medications    . antiseptic oral rinse  15 mL Mouth Rinse BID  . atorvastatin  40 mg Oral Daily  . digoxin  125 mcg Oral Daily  . folic acid  1 mg Intravenous Once  . furosemide  40 mg Intravenous Once  . insulin aspart  0-9 Units Subcutaneous Q4H  . metoprolol  5 mg Intravenous Once  . metoprolol succinate  50 mg Oral Daily  . mulitivitamin with minerals  1 tablet Oral Daily  . pantoprazole  40  mg Oral Daily  . potassium chloride  10 mEq Intravenous Q1 Hr x 5  . thiamine  100 mg Intravenous Daily  . thiamine  100 mg Oral Daily  . DISCONTD: cefTRIAXone (ROCEPHIN) IVPB 1 gram/50 mL D5W  1 g Intravenous Q24H  . DISCONTD: folic acid  1 mg Oral Daily  . DISCONTD: folic acid (FOLVITE) IVPB  1 mg Intravenous Once  . DISCONTD: LORazepam  0-4 mg Intravenous Q6H  . DISCONTD: LORazepam  0-4 mg Intravenous Q12H  . DISCONTD: metoprolol succinate  25 mg Oral Daily  . DISCONTD: potassium chloride  10 mEq Intravenous Q1 Hr x 5  . DISCONTD: thiamine  100 mg Oral Daily  PRN acetaminophen, dextrose, LORazepam, DISCONTD: LORazepam  Diet:  Full Liquid thin Activity:  Bathroom privileges; Up with assistance; DVT Prophylaxis:  SCDs due to SDH  Studies: CBC    Component Value Date/Time   WBC 8.7 10/05/2011 0452   WBC 5.9 05/19/2011 1548   RBC 3.35* 10/05/2011 0452   RBC 4.17* 05/19/2011 1548   HGB 11.8* 10/05/2011 0452   HGB 14.5 05/19/2011 1548   HCT 33.7* 10/05/2011 0452   HCT 41.2 05/19/2011 1548   PLT 106* 10/05/2011 0452   PLT 180 05/19/2011 1548   MCV 100.6* 10/05/2011 0452   MCV 98.8* 05/19/2011 1548   MCH 35.2* 10/05/2011 0452   MCH 34.8* 05/19/2011 1548   MCHC 35.0 10/05/2011 0452   MCHC 35.2 05/19/2011 1548   RDW  13.3 10/05/2011 0452   RDW 14.3 05/19/2011 1548   LYMPHSABS 0.6* 10/02/2011 2044   LYMPHSABS 1.7 05/19/2011 1548   MONOABS 0.3 10/02/2011 2044   MONOABS 0.6 05/19/2011 1548   EOSABS 0.0 10/02/2011 2044   EOSABS 0.1 05/19/2011 1548   BASOSABS 0.0 10/02/2011 2044   BASOSABS 0.0 05/19/2011 1548   CMP    Component Value Date/Time   NA 137 10/05/2011 0452   K 3.0* 10/05/2011 0452   CL 99 10/05/2011 0452   CO2 25 10/05/2011 0452   GLUCOSE 85 10/05/2011 0452   BUN 18 10/05/2011 0452   CREATININE 0.99 10/05/2011 0452   CALCIUM 7.5* 10/05/2011 0452   PROT 5.8* 10/05/2011 0452   ALBUMIN 2.9* 10/05/2011 0452   AST 338* 10/05/2011 0452   ALT 104* 10/05/2011 0452   ALKPHOS 91 10/05/2011  0452   BILITOT 1.8* 10/05/2011 0452   GFRNONAA 76* 10/05/2011 0452   GFRAA 88* 10/05/2011 0452   COAGS Lab Results  Component Value Date   INR 1.13 10/02/2011   INR 1.1 01/11/2007   Lipid Panel    Component Value Date/Time   CHOL 115 10/03/2011 0600   TRIG 63 10/03/2011 0600   HDL 55 10/03/2011 0600   CHOLHDL 2.1 10/03/2011 0600   VLDL 13 10/03/2011 0600   LDLCALC 47 10/03/2011 0600   HgbA1C  Lab Results  Component Value Date   HGBA1C 5.6 10/03/2011   Urine Drug Screen    Component Value Date/Time   LABOPIA POSITIVE* 10/02/2011 2007   COCAINSCRNUR NONE DETECTED 10/02/2011 2007   LABBENZ NONE DETECTED 10/02/2011 2007   AMPHETMU NONE DETECTED 10/02/2011 2007   THCU NONE DETECTED 10/02/2011 2007   LABBARB NONE DETECTED 10/02/2011 2007    Alcohol Level    Component Value Date/Time   [facility] <11 10/02/2011 2044   CT of the head 10/03/2011 Stable size and appearance of the left subdural hematomas. No significant mass effect or hydrocephalus. Stable opacification of the right mastoid air cells as described. Cerebral atrophy and evidence for encephalomalacia in the left frontal lobe.  10/02/2011 Left frontoparietal subdural hematoma without evidence of mass effect or midline shift. This measures 6  mm maximally in the frontal region. Atrophy, chronic small vessel white matter ischemic changes and remote left frontal infarct. Unchanged right mastoid effusion and fluid in the right middle ear.  MRI of the brain  Unable to obtain due pacer.  2D Echocardiogram    Carotid Doppler  Prelim Bilateral: No evidence of hemodynamically significant internal carotid artery stenosis. Vertebral artery flow is antegrade.   CXR  10/05/2011   Central vascular congestion without convincing pulmonary edema. Stable right Port-A-Cath position.  Hazy right basilar atelectasis or early infiltrate.  10/04/2011   Cardiomegaly and mild pulmonary vascular congestion without edema.  10/02/2011 Increase central venous pulmonary  congestion. Mild interstitial  edema.  Physical Exam    Awake alert. Afebrile. Head is nontraumatic. Neck is supple without bruit. Hearing is normal. Cardiac exam no murmur or gallop. Lungs are clear to auscultation. Distal pulses are well felt.   Neurological exam ; Awake  Alert oriented x 3. Normal speech and language except mild increase reaction time to answers..eye movements full without nystagmus.Full visual fields to bedside confrontational testing. Face symmetric. Tongue midline. Normal strength, tone, reflexes and coordination. Normal sensation. Gait deferred.  ASSESSMENT 78 y.o. male presenting with new onset moderately severe expressive aphasia as well as right visual field defect. Although patient has a small left frontal subdural hematoma,  he may have exprienced a recurrent acute stroke involving the left middle cerebral artery territory or more likely seizure. He is unable to get an MRI due to his pacer. Symptoms appear improved back to baseline now.  Stroke Risk Factors - atrial fibrillation, diabetes mellitus, hyperlipidemia and hypertension  Hospital day # 3  TREATMENT/PLAN -Would recommend long-term treatment with aspirin and Plavix for atrial fibrillation as not a candidate for anticoagulation due to his excessive EtOH use and frequent falls. Will hold these due to acute SDH until blood appears isodense. -check EEG -follow up 2D  -Transfer out of ICU to floor bed.  Joaquin Music, ANP-BC, GNP-BC [facility] Pager: [phone] 10/05/2011 8:29 AM  Dr. Delia Heady, Stroke Center Medical Director, has personally reviewed chart, pertinent data, examined the patient and developed the plan of care.

## 2011-10-05 NOTE — Evaluation (Signed)
Clinical/Bedside Swallow Evaluation Patient Details  Name: [name] MRN: [id number] DOB: 10/04/1929 Today's Date: 10/05/2011  Past Medical History:  Past Medical History  Diagnosis Date  . Atrial fibrillation 1990  . Dyslipidemia   . Diabetes mellitus   . Prostate hypertrophy   . Arthritis     degenerative-knees  . Bladder cancer 2010  . 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 History of radiation therapy 05/05/07-06/15/07    right parotid through subclavicular region/ mid jugularlymph node chain  . Allergy   . Hypertension     labile  . Heart murmur     2/6 systolic  . History of chemotherapy      Hx carboplatin/80fu  . Xerostomia     limited   Past Surgical History:  Past Surgical History  Procedure Date  . Pacemaker insertion 1990  . Cystoscopy 01/2011    neg   HPI:  78 y/o who presented to the ER for slurred speech which has resolved. He has a h/o frequent falls and was in the ER 2 wks ago with a head injury and was found incidentally on CT head to have a subdural hematoma.  SLP consulted for swallow evaluation given new RLL hazy infiltrate on CXR   Assessment/Recommendations/Treatment Plan    Patient presents with what appears to be a safe and functional oropharyngeal swallow at bedside without indication of a significant dysphagia or s/s of aspiration. Given neuro diagnosis as well as new possible RLL infiltrate on CXR, will continue to monitor diet tolerance at bedside. If worsening in lung sounds or indication of infection, may need to proceed with objective evaluation to r/o silent aspiration.  Risk for Aspiration: Mild  Swallow Evaluation Recommendations Diet Recommendations: Regular;Thin liquid Liquid Administration via: Cup;Straw Medication Administration: Whole meds with liquid Supervision: Patient able to self feed;Intermittent supervision to cue for compensatory strategies Compensations: Slow rate;Small sips/bites Postural Changes and/or Swallow  Maneuvers: Seated upright 90 degrees Oral Care Recommendations: Oral care BID   Swallowing Goals  SLP Swallowing Goals Patient will consume recommended diet without observed clinical signs of aspiration with: Supervision/safety Swallow Study Goal #1 - Progress: Not Met Patient will utilize recommended strategies during swallow to increase swallowing safety with: Supervision/safety Swallow Study Goal #2 - Progress: Not met   Ferdinand Lango MA, CCC-SLP [phone]  Juanetta Negash Meryl 10/05/2011,4:15 PM

## 2011-10-06 ENCOUNTER — Inpatient Hospital Stay (HOSPITAL_COMMUNITY): Payer: Medicare Other

## 2011-10-06 LAB — GLUCOSE, CAPILLARY
Glucose-Capillary: 108 mg/dL — ABNORMAL HIGH (ref 70–99)
Glucose-Capillary: 136 mg/dL — ABNORMAL HIGH (ref 70–99)

## 2011-10-06 LAB — CBC
HCT: 33.2 % — ABNORMAL LOW (ref 39.0–52.0)
Hemoglobin: 11.5 g/dL — ABNORMAL LOW (ref 13.0–17.0)
MCH: 35.8 pg — ABNORMAL HIGH (ref 26.0–34.0)
MCV: 103.4 fL — ABNORMAL HIGH (ref 78.0–100.0)
RBC: 3.21 MIL/uL — ABNORMAL LOW (ref 4.22–5.81)
WBC: 7.8 10*3/uL (ref 4.0–10.5)

## 2011-10-06 LAB — COMPREHENSIVE METABOLIC PANEL
BUN: 18 mg/dL (ref 6–23)
CO2: 26 mEq/L (ref 19–32)
Calcium: 7.5 mg/dL — ABNORMAL LOW (ref 8.4–10.5)
Chloride: 102 mEq/L (ref 96–112)
Creatinine, Ser: 0.79 mg/dL (ref 0.50–1.35)
GFR calc Af Amer: >90 mL/min (ref >90)
GFR calc non Af Amer: 83 mL/min — ABNORMAL LOW (ref >90)
Glucose, Bld: 87 mg/dL (ref 70–99)
Total Bilirubin: 1.8 mg/dL — ABNORMAL HIGH (ref 0.3–1.2)

## 2011-10-06 LAB — MAGNESIUM: Magnesium: 0.9 mg/dL — CL (ref 1.5–2.5)

## 2011-10-06 MED ORDER — MAGNESIUM SULFATE IN D5W 10-5 MG/ML-% IV SOLN
1.0000 g | Freq: Once | INTRAVENOUS | Status: AC
  Administered 2011-10-06: 1 g via INTRAVENOUS
  Filled 2011-10-06: qty 100

## 2011-10-06 MED ORDER — POTASSIUM CHLORIDE CRYS ER 20 MEQ PO TBCR
20.0000 meq | EXTENDED_RELEASE_TABLET | Freq: Once | ORAL | Status: AC
  Administered 2011-10-06: 20 meq via ORAL
  Filled 2011-10-06: qty 1

## 2011-10-06 MED ORDER — RAMIPRIL 1.25 MG PO CAPS
1.2500 mg | ORAL_CAPSULE | Freq: Every day | ORAL | Status: DC
  Administered 2011-10-06 – 2011-10-09 (×4): 1.25 mg via ORAL
  Filled 2011-10-06 (×4): qty 1

## 2011-10-06 MED ORDER — ZOLPIDEM TARTRATE 5 MG PO TABS
5.0000 mg | ORAL_TABLET | Freq: Every evening | ORAL | Status: DC | PRN
  Administered 2011-10-06: 5 mg via ORAL
  Filled 2011-10-06: qty 1

## 2011-10-06 MED ORDER — SODIUM CHLORIDE 0.9 % IJ SOLN
10.0000 mL | INTRAMUSCULAR | Status: DC | PRN
  Administered 2011-10-06 – 2011-10-09 (×2): 10 mL

## 2011-10-06 MED ORDER — RAMIPRIL 10 MG PO CAPS: 10.0000 mg | ORAL_CAPSULE | Freq: Every day | ORAL | Status: DC

## 2011-10-06 NOTE — Progress Notes (Signed)
Patient ID: DODD SCHMID, male   DOB: Feb 23, 1930, 78 y.o.   MRN: [id number] Stroke Team Progress Note  HISTORY [name] is an 78 y.o. male history of alcohol abuse, atrial fibrillation on Coumadin diabetes mellitus, hypertension, hyperlipidemia and bladder cancer, presenting with new onset marked speech difficulty with nonsensical speech output with frequent errors. Patient has not demonstrated any difficulty with understanding what was being said to him. His previous history of left frontal stroke. CT scan of his head showed a 6 mm left frontal subdural hematoma without mass effect. Patient has a pacemaker and could not undergo evaluation with MRI. His NIH stroke score was 6. Patient was not a candidate for TPA because he was out of it time window for treatment at the time he presented to the emergency room.  SUBJECTIVE Complains of throbbing knee pain. Also reports insomnia. Requests ambien.  OBJECTIVE Most recent Vital Signs: Temp: 98.4 F (36.9 C) (04/16 0409) Temp src: Oral (04/16 0409) BP: 142/85 mmHg (04/16 0409) Pulse Rate: 69  (04/16 0409) Respiratory Rate: 20 O2 Saturation: 99%  CBG (last 3)  Basename 10/06/11 0628 10/05/11 2059 10/05/11 1620  GLUCAP 87 113* 144*   Intake/Output from previous day: 04/15 0701 - 04/16 0700 In: 1794 [P.O.:1420; I.V.:224; IV Piggyback:150] Out: 720 [Urine:720]  IV Fluid Intake:     . sodium chloride 20 mL/hr (10/04/11 1200)  . DISCONTD: dexmedetomidine (PRECEDEX) IV infusion Stopped (10/05/11 0835)   Medications    . antiseptic oral rinse  15 mL Mouth Rinse BID  . atorvastatin  40 mg Oral Daily  . digoxin  125 mcg Oral Daily  . insulin aspart  0-9 Units Subcutaneous TID WC  . magnesium sulfate 1 - 4 g bolus IVPB  1 g Intravenous Once  . metoprolol succinate  50 mg Oral Daily  . mulitivitamin with minerals  1 tablet Oral Daily  . pantoprazole  40 mg Oral Daily  . potassium chloride  10 mEq Intravenous Q1 Hr x 5  . potassium  chloride SA      . potassium chloride  20 mEq Oral Once  . ramipril  1.25 mg Oral Daily  . thiamine  100 mg Oral Daily  . DISCONTD: insulin aspart  0-9 Units Subcutaneous Q4H  . DISCONTD: ramipril  10 mg Oral Daily  PRN acetaminophen, iohexol, LORazepam, phenol, sodium chloride  Diet:  Full Liquid thin Activity:  Bathroom privileges; Up with assistance; DVT Prophylaxis:  SCDs due to SDH  Studies: CBC    Component Value Date/Time   WBC 7.8 10/06/2011 0500   WBC 5.9 05/19/2011 1548   RBC 3.21* 10/06/2011 0500   RBC 4.17* 05/19/2011 1548   HGB 11.5* 10/06/2011 0500   HGB 14.5 05/19/2011 1548   HCT 33.2* 10/06/2011 0500   HCT 41.2 05/19/2011 1548   PLT 118* 10/06/2011 0500   PLT 180 05/19/2011 1548   MCV 103.4* 10/06/2011 0500   MCV 98.8* 05/19/2011 1548   MCH 35.8* 10/06/2011 0500   MCH 34.8* 05/19/2011 1548   MCHC 34.6 10/06/2011 0500   MCHC 35.2 05/19/2011 1548   RDW 13.9 10/06/2011 0500   RDW 14.3 05/19/2011 1548   LYMPHSABS 0.6* 10/02/2011 2044   LYMPHSABS 1.7 05/19/2011 1548   MONOABS 0.3 10/02/2011 2044   MONOABS 0.6 05/19/2011 1548   EOSABS 0.0 10/02/2011 2044   EOSABS 0.1 05/19/2011 1548   BASOSABS 0.0 10/02/2011 2044   BASOSABS 0.0 05/19/2011 1548   CMP    Component Value Date/Time  NA 138 10/06/2011 0500   K 3.3* 10/06/2011 0500   CL 102 10/06/2011 0500   CO2 26 10/06/2011 0500   GLUCOSE 87 10/06/2011 0500   BUN 18 10/06/2011 0500   CREATININE 0.79 10/06/2011 0500   CALCIUM 7.5* 10/06/2011 0500   PROT 5.3* 10/06/2011 0500   ALBUMIN 2.6* 10/06/2011 0500   AST 247* 10/06/2011 0500   ALT 124* 10/06/2011 0500   ALKPHOS 96 10/06/2011 0500   BILITOT 1.8* 10/06/2011 0500   GFRNONAA 83* 10/06/2011 0500   GFRAA >90 10/06/2011 0500   COAGS Lab Results  Component Value Date   INR 1.13 10/02/2011   INR 1.1 01/11/2007   Lipid Panel    Component Value Date/Time   CHOL 115 10/03/2011 0600   TRIG 63 10/03/2011 0600   HDL 55 10/03/2011 0600   CHOLHDL 2.1 10/03/2011 0600   VLDL 13  10/03/2011 0600   LDLCALC 47 10/03/2011 0600   HgbA1C  Lab Results  Component Value Date   HGBA1C 5.6 10/03/2011   Urine Drug Screen    Component Value Date/Time   LABOPIA POSITIVE* 10/02/2011 2007   COCAINSCRNUR NONE DETECTED 10/02/2011 2007   LABBENZ NONE DETECTED 10/02/2011 2007   AMPHETMU NONE DETECTED 10/02/2011 2007   THCU NONE DETECTED 10/02/2011 2007   LABBARB NONE DETECTED 10/02/2011 2007    Alcohol Level    Component Value Date/Time   [facility] <11 10/02/2011 2044   CT of the head 10/03/2011 Stable size and appearance of the left subdural hematomas. No significant mass effect or hydrocephalus. Stable opacification of the right mastoid air cells as described. Cerebral atrophy and evidence for encephalomalacia in the left frontal lobe.  10/02/2011 Left frontoparietal subdural hematoma without evidence of mass effect or midline shift. This measures 6  mm maximally in the frontal region. Atrophy, chronic small vessel white matter ischemic changes and remote left frontal infarct. Unchanged right mastoid effusion and fluid in the right middle ear.  CT angio of the head  1.  Mixed density left subdural hematoma with slight increased size and progression of rightward midline shift (3 mm) since 10/03/2011. Trace left subarachnoid hemorrhage. 2.  Low density right subdural hematoma/hygroma is stable since 08/22/2011. 3.  Negative intracranial CTA except for ICA siphon and right MCA bifurcation atherosclerosis.    CT angio of the neck 1.  Mild for age neck atherosclerosis.  No significant arterial stenosis in the neck. 2.  Intracranial findings are below. 3.  Inflammatory changes in the right tympanic cavity and mastoids with only minimal improvement since 08/22/2011.  ENT follow-up may be valuable. 4.  Layering pleural effusions.   MRI of the brain  Unable to obtain due pacer.  2D Echocardiogram  EF 55-60% with no source of embolus. pacer wire right ventricle  Carotid Doppler  Prelim Bilateral: No  evidence of hemodynamically significant internal carotid artery stenosis. Vertebral artery flow is antegrade.   CXR  10/05/2011   Central vascular congestion without convincing pulmonary edema. Stable right Port-A-Cath position.  Hazy right basilar atelectasis or early infiltrate.  10/04/2011   Cardiomegaly and mild pulmonary vascular congestion without edema.  10/02/2011 Increase central venous pulmonary congestion. Mild interstitial  Edema.  EEG  Physical Exam    Awake alert. Afebrile. Head is nontraumatic. Neck is supple without bruit. Hearing is normal. Cardiac exam no murmur or gallop. Lungs are clear to auscultation. Distal pulses are well felt.   Neurological exam ; Awake  Alert oriented x 3. Normal speech and language except  mild increase reaction time to answers..eye movements full without nystagmus.Full visual fields to bedside confrontational testing. Face symmetric. Tongue midline. Normal strength, tone, reflexes and coordination. Normal sensation. Gait deferred.   ASSESSMENT 78 y.o. male presenting with new onset moderately severe expressive aphasia as well as right visual field defect. Patient has a small left frontal subdural hematoma. He is not felt to have had an acute stroke.. Most likely had a seizure. He is unable to get an MRI due to his pacer. Symptoms appear improved back to baseline.  -knee pain. Requests tylenol. -insomnia. Requests ambien.  Stroke Risk Factors - atrial fibrillation, diabetes mellitus, hyperlipidemia and hypertension  Hospital day # 4  TREATMENT/PLAN -Would recommend long-term treatment with aspirin and Plavix for atrial fibrillation as not a candidate for anticoagulation due to his excessive EtOH use and frequent falls. Will hold these due to acute SDH until blood appears isodense. -check EEG -ambien ok for sleep. Have added.   Joaquin Music, ANP-BC, GNP-BC [facility] Pager: [phone] 10/06/2011 10:00 AM  Dr. Delia Heady, Stroke Center Medical Director, has personally reviewed chart, pertinent data, examined the patient and developed the plan of care.

## 2011-10-06 NOTE — Progress Notes (Signed)
Physical Therapy Treatment Patient Details Name: [name] MRN: [id number] DOB: 1930/02/03 Today's Date: 10/06/2011  PT Assessment/Plan  PT - Assessment/Plan Comments on Treatment Session: VSS throughout tx today, but pt remains limited with mobility by L knee pain. Pt able to walk 120' with RW and required less physical assist with mobility.  PT Plan: Discharge plan remains appropriate PT Frequency: Min 3X/week Follow Up Recommendations: Skilled nursing facility;Supervision/Assistance - 24 hour Equipment Recommended: Defer to next venue PT Goals  Acute Rehab PT Goals PT Goal: Supine/Side to Sit - Progress: Progressing toward goal PT Goal: Sit to Stand - Progress: Progressing toward goal PT Goal: Stand to Sit - Progress: Progressing toward goal PT Goal: Ambulate - Progress: Progressing toward goal  PT Treatment Precautions/Restrictions  Precautions Precautions: Fall Restrictions Weight Bearing Restrictions: No Mobility (including Balance) Bed Mobility Supine to Sit: 4: Min assist Supine to Sit Details (indicate cue type and reason): Pt required encouragement, increased time, and sequencing cues for upine>sit Sitting - Scoot to Edge of Bed: 4: Min assist Sitting - Scoot to [place] of Bed Details (indicate cue type and reason): Min A for advanging hips, cues for hand placement Transfers Sit to Stand: 3: Mod assist;With upper extremity assist;From elevated surface;From bed Sit to Stand Details (indicate cue type and reason): Mod A for lifting and anterior weight-shift, cues for safe hand placement.  Stand to Sit: 4: Min assist;With upper extremity assist;With armrests;To chair/3-in-1 Stand to Sit Details: Cues for hand placement, but with difficulty controlling descent Ambulation/Gait Ambulation/Gait: Yes Ambulation/Gait Assistance: 4: Min assist Ambulation/Gait Assistance Details (indicate cue type and reason): Min A for steadying only. Pt with decrease pace and tending to  keep RW far ahead with short steps. Pt somewhat able to modify with cues, but difficulty maintaining. Gait limited by L knee pain/arthritis per pt.  Ambulation Distance (Feet): 120 Feet Assistive device: Rolling walker Gait Pattern: Step-to pattern;Decreased stride length;Trunk flexed;Shuffle Gait velocity: 0.29 ft/sec Stairs: No    Exercise  General Exercises - Lower Extremity Ankle Circles/Pumps: Seated;20 reps;Both;Strengthening;AROM Long Arc Quad: Seated;10 reps;Both;Strengthening;AROM (Decr L knee ROM) Hip Flexion/Marching: Seated;10 reps;Both;Strengthening;AROM End of Session PT - End of Session Equipment Utilized During Treatment: Gait belt Activity Tolerance: Patient tolerated treatment well;Patient limited by pain Patient left: in chair;with call bell in reach Nurse Communication: Mobility status for transfers;Mobility status for ambulation General Behavior During Session: [facility] for tasks performed Cognition: Impaired Cognitive Impairment: Decreased safety awareness and awareness of situation  Virl Cagey, [place] [phone] 10/06/2011, 12:42 PM

## 2011-10-06 NOTE — Progress Notes (Signed)
Speech Language Pathology Dysphagia Treatment  Patient Details Name: [name] MRN: [id number] DOB: 1929/06/28 Today's Date: 10/06/2011  SLP Assessment/Plan/Recommendation Assessment / Recommendations / Plan Clinical Impression Statement: Skilled observation of pos revealed what continues to appear like a functional oropharyngeal swallow with no overt s/s of aspiration. Min verbal cues provided to remind patient to take small bite and sips as a general safe swallowing precaution. Overall, patient appears to be tolerating diet however will f/u in 2-3 days to ensure continued tolerance given CXR results indicative of potential RLL infiltrate.  Continue with Current Diet: Regular;Thin liquid Liquids provided via: Cup;Straw Medication Administration: Whole meds with liquid Supervision: Patient able to self feed;Intermittent supervision to cue for compensatory strategies Compensations: Slow rate;Small sips/bites Postural Changes and/or Swallow Maneuvers: Seated upright 90 degrees Oral Care Recommendations: Oral care BID Plan: Continue with current plan of care Swallowing Goals  SLP Swallowing Goals Patient will consume recommended diet without observed clinical signs of aspiration with: Supervision/safety Swallow Study Goal #1 - Progress: Progressing toward goal Patient will utilize recommended strategies during swallow to increase swallowing safety with: Supervision/safety Swallow Study Goal #2 - Progress: Progressing toward goal  General Temperature Spikes Noted: No Respiratory Status: Room air Behavior/Cognition: Alert;Cooperative;Pleasant mood Oral Cavity - Dentition: Adequate natural dentition Patient Positioning: Upright in bed  Oral Cavity - Oral Hygiene Does patient have any of the following "at risk" factors?: None of the above   Dysphagia Treatment Treatment focused on: Skilled observation of diet tolerance;Patient/family/caregiver education Treatment  Methods/Modalities: Skilled observation Patient observed directly with PO's: Yes Type of PO's observed: Regular;Thin liquids Feeding: Able to feed self Liquids provided via: Straw Type of cueing: Verbal Amount of cueing: Minimal   [name] [name] 10/06/2011, 3:56 PM

## 2011-10-06 NOTE — Progress Notes (Signed)
   CARE MANAGEMENT NOTE 10/06/2011  Patient:  [name], [name]   Account Number:  [id number]  Date Initiated:  10/05/2011  Documentation initiated by:  Avie Arenas  Subjective/Objective Assessment:   ETOH abuse - freq faller - SDH, now in ICU on precedex drip for agitation.  Daughter lives with him , [name] [phone].     Action/Plan:   Anticipated DC Date:  10/09/2011   Anticipated DC Plan:  SKILLED NURSING FACILITY  In-house referral  Clinical Social Worker      DC Planning Services  CM consult      Choice offered to / List presented to:             Status of service:  In process, will continue to follow Medicare Important Message given?   (If response is "NO", the following Medicare IM given date fields will be blank) Date Medicare IM given:   Date Additional Medicare IM given:    Discharge Disposition:    Per UR Regulation:  Reviewed for med. necessity/level of care/duration of stay  If discussed at Long Length of Stay Meetings, dates discussed:    Comments:  10/06/11 15:09 Letha Cape RN, BSN [phone] pateint 's daughter, [name], lives with him, she has a broken leg at this time still on cructches.  Per physical therapy recs snf, spoke with patient he states this will be ok , informed him that CSW will come and speak with him tomorrow.  Informed CSW of snf referral.

## 2011-10-06 NOTE — Consult Note (Signed)
EEG = normal awake and drowsy record.   Carmell Austria, MD

## 2011-10-06 NOTE — Progress Notes (Signed)
MD notified of Mg level, told to pass it on to next nurse, so they can tell the doctors coming on at 0700. Will continue to monitor.

## 2011-10-06 NOTE — Progress Notes (Addendum)
78 y/o who presented to the ER for slurred speech which has resolved. He has a h/o frequent falls and was in the ER 2 wks ago with a head injury and was found incidentally on CT head to have a subdural hematoma.  We are working up a TIA. Neuro is following.    Subjective:  -pateint feels much better concern about his walking ability.  Objective: Blood pressure 142/85, pulse 69, temperature 98.4 F (36.9 C), temperature source Oral, resp. rate 20, height 6\' 1"  (1.854 m), weight 81.2 kg (179 lb 0.2 oz), SpO2 99.00%. Weight change:   Intake/Output Summary (Last 24 hours) at 10/06/11 0908 Last data filed at 10/06/11 0100  Gross per 24 hour  Intake   1190 ml  Output    660 ml  Net    530 ml    Physical Exam: General appearance: alert and cooperative Throat: lips, mucosa, and tongue normal Lungs: clear to auscultation bilaterally Heart: regular rate and rhythm, S1, S2 normal, no murmur, click, rub or gallop Abdomen: soft, non-tender; bowel sounds normal; no masses,  no organomegaly Extremities: extremities normal, atraumatic, no cyanosis or edema Neurologic: Grossly normal, except lowe extremity strength which is decrease.  Lab Results:  Basename 10/06/11 0500 10/05/11 0452  NA 138 137  K 3.3* 3.0*  CL 102 99  CO2 26 25  GLUCOSE 87 85  BUN 18 18  CREATININE 0.79 0.99  CALCIUM 7.5* 7.5*  MG 0.9* --  PHOS -- 4.7*    Basename 10/06/11 0500 10/05/11 0452  AST 247* 338*  ALT 124* 104*  ALKPHOS 96 91  BILITOT 1.8* 1.8*  PROT 5.3* 5.8*  ALBUMIN 2.6* 2.9*   No results found for this basename: LIPASE:2,AMYLASE:2 in the last 72 hours  Basename 10/06/11 0500 10/05/11 0452  WBC 7.8 8.7  NEUTROABS -- --  HGB 11.5* 11.8*  HCT 33.2* 33.7*  MCV 103.4* 100.6*  PLT 118* 106*   No results found for this basename: CKTOTAL:3,CKMB:3,CKMBINDEX:3,TROPONINI:3 in the last 72 hours No components found with this basename: POCBNP:3 No results found for this basename: DDIMER:2 in the  last 72 hours No results found for this basename: HGBA1C:2 in the last 72 hours No results found for this basename: CHOL:2,HDL:2,LDLCALC:2,TRIG:2,CHOLHDL:2,LDLDIRECT:2 in the last 72 hours No results found for this basename: TSH,T4TOTAL,FREET3,T3FREE,THYROIDAB in the last 72 hours No results found for this basename: VITAMINB12:2,FOLATE:2,FERRITIN:2,TIBC:2,IRON:2,RETICCTPCT:2 in the last 72 hours  Micro Results: Recent Results (from the past 240 hour(s))  URINE CULTURE     Status: Normal   Collection Time   10/02/11  8:07 PM      Component Value Range Status Comment   Specimen Description URINE, RANDOM   Final    Special Requests NONE   Final    Culture  Setup Time [id number]   Final    Colony Count 20,OOO COLONIES/ML   Final    Culture     Final    Value: Multiple bacterial morphotypes present, none predominant. Suggest appropriate recollection if clinically indicated.   Report Status 10/04/2011 FINAL   Final   CULTURE, BLOOD (ROUTINE X 2)     Status: Normal (Preliminary result)   Collection Time   10/03/11 12:25 AM      Component Value Range Status Comment   Specimen Description BLOOD RIGHT ARM   Final    Special Requests BOTTLES DRAWN AEROBIC AND ANAEROBIC [facility]   Final    Culture  Setup Time [id number]   Final    Culture  Final    Value:        BLOOD CULTURE RECEIVED NO GROWTH TO DATE CULTURE WILL BE HELD FOR 5 DAYS BEFORE ISSUING A FINAL NEGATIVE REPORT   Report Status PENDING   Incomplete   CULTURE, BLOOD (ROUTINE X 2)     Status: Normal (Preliminary result)   Collection Time   10/03/11 12:35 AM      Component Value Range Status Comment   Specimen Description BLOOD LEFT HAND   Final    Special Requests BOTTLES DRAWN AEROBIC AND ANAEROBIC 10CC EACH   Final    Culture  Setup Time [id number]   Final    Culture     Final    Value:        BLOOD CULTURE RECEIVED NO GROWTH TO DATE CULTURE WILL BE HELD FOR 5 DAYS BEFORE ISSUING A FINAL NEGATIVE REPORT   Report Status  PENDING   Incomplete     Studies/Results: Dg Chest 1 View  10/02/2011  *RADIOLOGY REPORT*  Clinical Data: Altered mental status  CHEST - 1 VIEW  Comparison:  Chest radiograph 08/24/2010  Findings: Left-sided pacemaker overlies stable cardiac silhouette. Right port in place.  There is increased central venous congestion compared to prior.  No focal consolidation.  No pneumothorax. Degenerative osteophytosis of the thoracic spine.  IMPRESSION: Increase central venous pulmonary congestion.  Mild interstitial edema.  Original Report Authenticated By: Genevive Bi, M.D.   Ct Head Wo Contrast  10/03/2011  *RADIOLOGY REPORT*  Clinical Data: Follow-up subdural hematoma.  CT HEAD WITHOUT CONTRAST  Technique:  Contiguous axial images were obtained from the base of the skull through the vertex without contrast.  Comparison: 10/02/2011  Findings: Again noted is a left frontal subdural hematoma and a left parietal-occipital subdural hematoma.  The size and distribution of the hematomas have not significantly changed. There is probably a small amount of blood along the left posterior falx which is unchanged.  Again noted is cerebral atrophy and evidence for encephalomalacia in the lower left frontal lobe. There is no significant midline shift or hydrocephalus.  Stable opacification and probably fluid involving the right mastoid air cells.  There is mild mucosal disease in the left ethmoid air cells.  Mild mucosal disease in the left maxillary sinus.  No acute bony abnormality.  IMPRESSION: Stable size and appearance of the left subdural hematomas.  No significant mass effect or hydrocephalus.  Stable opacification of the right mastoid air cells as described.  Cerebral atrophy and evidence for encephalomalacia in the left frontal lobe.  Original Report Authenticated By: Richarda Overlie, M.D.   Ct Head Wo Contrast  10/02/2011  *RADIOLOGY REPORT*  Clinical Data: 78 year old male with multiple falls and altered mental status.   CT HEAD WITHOUT CONTRAST  Technique:  Contiguous axial images were obtained from the base of the skull through the vertex without contrast.  Comparison: 08/22/2011  Findings: A left frontoparietal subdural hematoma is identified, measuring 6 mm in the frontal region and 4 mm in the posterior parietal region.  There is no evidence of midline shift or mass effect.  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  There is no evidence of hydrocephalus, acute infarction or mass lesion.  There is no evidence of acute fracture. A right mastoid effusion and fluid in the right middle ear again noted.  IMPRESSION: Left frontoparietal subdural hematoma without evidence of mass effect or midline shift.  This measures 6 mm maximally  in the frontal region.  Atrophy, chronic small vessel white matter ischemic changes and remote left frontal infarct.  Unchanged right mastoid effusion and fluid in the right middle ear.  Critical Value/emergent results were called by telephone at the time of interpretation on 10/02/2011  at 8:55 p.m.  to  Dr. Radford Pax, who verbally acknowledged these results.  Original Report Authenticated By: Rosendo Gros, M.D.    Medications: Scheduled Meds:    . antiseptic oral rinse  15 mL Mouth Rinse BID  . atorvastatin  40 mg Oral Daily  . digoxin  125 mcg Oral Daily  . insulin aspart  0-9 Units Subcutaneous TID WC  . magnesium sulfate 1 - 4 g bolus IVPB  1 g Intravenous Once  . metoprolol succinate  50 mg Oral Daily  . mulitivitamin with minerals  1 tablet Oral Daily  . pantoprazole  40 mg Oral Daily  . potassium chloride  10 mEq Intravenous Q1 Hr x 5  . potassium chloride SA      . potassium chloride  20 mEq Oral Once  . thiamine  100 mg Oral Daily  . DISCONTD: insulin aspart  0-9 Units Subcutaneous Q4H   Continuous Infusions:    . sodium chloride 20 mL/hr (10/04/11 1200)  . DISCONTD: dexmedetomidine (PRECEDEX) IV  infusion Stopped (10/05/11 0835)   PRN Meds:.acetaminophen, iohexol, LORazepam, phenol, sodium chloride  Assessment/Plan: Principal Problem:  *TIA (transient ischemic attack)/ CVA? -Neuro recommended ASA and plavix for afib will be resume once SDH appears isodense. ? When to repeat imaging, to evaluate for improvement of SDH. -ejection fraction was in the range of 55% to 60% -carodi doppler: Bilateral: No evidence of hemodynamically significant internal carotid artery stenosis   Subdural hematoma -NS has evaluated- f/u CT is stable. NS signed off.  -? When to repeat imaging.   UTI (lower urinary tract infection) Culture negative d/c rocephin  Frequent falls -PT evaluation recommend SNF.    Atrial fibrillation/Pacemaker -V- paced -d/c telemetry   Diabetes mellitus -Sugars stable, on sliding scale   Hypertension -High today resume at lower dose home dose is 10mg    ETOH abuse -ETOH level was low on admission; on CIWA for withdrawl    LOS: 4 days   Marinda Elk [phone] 10/06/2011, 9:08 AM

## 2011-10-06 NOTE — Progress Notes (Signed)
CRITICAL VALUE ALERT  Critical value received:  Magnesium 0.9  Date of notification:  10/06/11  Time of notification:  0635  Critical value read back:yes  Nurse who received alert:  France Ravens  MD notified (1st page):  MD on floor Craige Cotta  Time of first page:  [phone]  MD notified (2nd page):  Time of second page:  Responding MD:  Craige Cotta  Time MD responded:  MD already on floor

## 2011-10-07 LAB — GLUCOSE, CAPILLARY
Glucose-Capillary: 130 mg/dL — ABNORMAL HIGH (ref 70–99)
Glucose-Capillary: 105 mg/dL — ABNORMAL HIGH (ref 70–99)
Glucose-Capillary: 130 mg/dL — ABNORMAL HIGH (ref 70–99)

## 2011-10-07 MED ORDER — MAGNESIUM OXIDE 400 MG PO TABS
400.0000 mg | ORAL_TABLET | Freq: Two times a day (BID) | ORAL | Status: AC
  Administered 2011-10-07 – 2011-10-08 (×3): 400 mg via ORAL
  Filled 2011-10-07 (×3): qty 1

## 2011-10-07 NOTE — Progress Notes (Signed)
Patient ID: [name], male   DOB: January 04, 1930, 78 y.o.   MRN: [id number] Stroke Team Progress Note  HISTORY [name] is an 78 y.o. male history of alcohol abuse, atrial fibrillation on Coumadin diabetes mellitus, hypertension, hyperlipidemia and bladder cancer, presenting with new onset marked speech difficulty with nonsensical speech output with frequent errors. Patient has not demonstrated any difficulty with understanding what was being said to him. His previous history of left frontal stroke. CT scan of his head showed a 6 mm left frontal subdural hematoma without mass effect. Patient has a pacemaker and could not undergo evaluation with MRI. His NIH stroke score was 6. Patient was not a candidate for TPA because he was out of it time window for treatment at the time he presented to the emergency room.  SUBJECTIVE Patient sleeping in bed. Easily awakened.   OBJECTIVE Most recent Vital Signs: Temp: 98.5 F (36.9 C) (04/17 0404) Temp src: Oral (04/17 0404) BP: 152/90 mmHg (04/17 0410) Pulse Rate: 69  (04/17 0404) Respiratory Rate: 19 O2 Saturation: 98%  CBG (last 3)  Basename 10/07/11 0533 10/06/11 2123 10/06/11 1617  GLUCAP 130* 136* 108*   Intake/Output from previous day: 04/16 0701 - 04/17 0700 In: 840 [P.O.:480; I.V.:360] Out: -   IV Fluid Intake:     . sodium chloride 20 mL/hr (10/04/11 1200)   Medications    . antiseptic oral rinse  15 mL Mouth Rinse BID  . atorvastatin  40 mg Oral Daily  . digoxin  125 mcg Oral Daily  . insulin aspart  0-9 Units Subcutaneous TID WC  . metoprolol succinate  50 mg Oral Daily  . mulitivitamin with minerals  1 tablet Oral Daily  . pantoprazole  40 mg Oral Daily  . ramipril  1.25 mg Oral Daily  . thiamine  100 mg Oral Daily  . DISCONTD: ramipril  10 mg Oral Daily  PRN acetaminophen, LORazepam, phenol, sodium chloride, zolpidem  Diet:  Cardiac thin liquids Activity:  Bathroom privileges; Up with assistance; DVT  Prophylaxis:  SCDs due to SDH  Studies: CBC    Component Value Date/Time   WBC 7.8 10/06/2011 0500   WBC 5.9 05/19/2011 1548   RBC 3.21* 10/06/2011 0500   RBC 4.17* 05/19/2011 1548   HGB 11.5* 10/06/2011 0500   HGB 14.5 05/19/2011 1548   HCT 33.2* 10/06/2011 0500   HCT 41.2 05/19/2011 1548   PLT 118* 10/06/2011 0500   PLT 180 05/19/2011 1548   MCV 103.4* 10/06/2011 0500   MCV 98.8* 05/19/2011 1548   MCH 35.8* 10/06/2011 0500   MCH 34.8* 05/19/2011 1548   MCHC 34.6 10/06/2011 0500   MCHC 35.2 05/19/2011 1548   RDW 13.9 10/06/2011 0500   RDW 14.3 05/19/2011 1548   LYMPHSABS 0.6* 10/02/2011 2044   LYMPHSABS 1.7 05/19/2011 1548   MONOABS 0.3 10/02/2011 2044   MONOABS 0.6 05/19/2011 1548   EOSABS 0.0 10/02/2011 2044   EOSABS 0.1 05/19/2011 1548   BASOSABS 0.0 10/02/2011 2044   BASOSABS 0.0 05/19/2011 1548   CMP    Component Value Date/Time   NA 138 10/06/2011 0500   K 3.9 10/06/2011 1211   CL 102 10/06/2011 0500   CO2 26 10/06/2011 0500   GLUCOSE 87 10/06/2011 0500   BUN 18 10/06/2011 0500   CREATININE 0.79 10/06/2011 0500   CALCIUM 7.5* 10/06/2011 0500   PROT 5.3* 10/06/2011 0500   ALBUMIN 2.6* 10/06/2011 0500   AST 247* 10/06/2011 0500   ALT 124*  10/06/2011 0500   ALKPHOS 96 10/06/2011 0500   BILITOT 1.8* 10/06/2011 0500   GFRNONAA 83* 10/06/2011 0500   GFRAA >90 10/06/2011 0500   COAGS Lab Results  Component Value Date   INR 1.13 10/02/2011   INR 1.1 01/11/2007   Lipid Panel    Component Value Date/Time   CHOL 115 10/03/2011 0600   TRIG 63 10/03/2011 0600   HDL 55 10/03/2011 0600   CHOLHDL 2.1 10/03/2011 0600   VLDL 13 10/03/2011 0600   LDLCALC 47 10/03/2011 0600   HgbA1C  Lab Results  Component Value Date   HGBA1C 5.6 10/03/2011   Urine Drug Screen    Component Value Date/Time   LABOPIA POSITIVE* 10/02/2011 2007   COCAINSCRNUR NONE DETECTED 10/02/2011 2007   LABBENZ NONE DETECTED 10/02/2011 2007   AMPHETMU NONE DETECTED 10/02/2011 2007   THCU NONE DETECTED 10/02/2011 2007    LABBARB NONE DETECTED 10/02/2011 2007    Alcohol Level    Component Value Date/Time   [facility] <11 10/02/2011 2044   CT of the head 10/03/2011 Stable size and appearance of the left subdural hematomas. No significant mass effect or hydrocephalus. Stable opacification of the right mastoid air cells as described. Cerebral atrophy and evidence for encephalomalacia in the left frontal lobe.  10/02/2011 Left frontoparietal subdural hematoma without evidence of mass effect or midline shift. This measures 6  mm maximally in the frontal region. Atrophy, chronic small vessel white matter ischemic changes and remote left frontal infarct. Unchanged right mastoid effusion and fluid in the right middle ear.  CT angio of the head  1.  Mixed density left subdural hematoma with slight increased size and progression of rightward midline shift (3 mm) since 10/03/2011. Trace left subarachnoid hemorrhage. 2.  Low density right subdural hematoma/hygroma is stable since 08/22/2011. 3.  Negative intracranial CTA except for ICA siphon and right MCA bifurcation atherosclerosis.    CT angio of the neck 1.  Mild for age neck atherosclerosis.  No significant arterial stenosis in the neck. 2.  Intracranial findings are below. 3.  Inflammatory changes in the right tympanic cavity and mastoids with only minimal improvement since 08/22/2011.  ENT follow-up may be valuable. 4.  Layering pleural effusions.   MRI of the brain  Unable to obtain due pacer.  2D Echocardiogram  EF 55-60% with no source of embolus. pacer wire right ventricle  Carotid Doppler  Prelim Bilateral: No evidence of hemodynamically significant internal carotid artery stenosis. Vertebral artery flow is antegrade.   CXR  10/05/2011   Central vascular congestion without convincing pulmonary edema. Stable right Port-A-Cath position.  Hazy right basilar atelectasis or early infiltrate.  10/04/2011   Cardiomegaly and mild pulmonary vascular congestion without edema.    10/02/2011 Increase central venous pulmonary congestion. Mild interstitial  Edema.  EEG This was a normal awake and drowsy EEG. A normal EEG does not rule out the clinical diagnosis of epilepsy, if clinically warranted, a repeat extended EEG or ambulatory requiring may be obtained for prolonged recording times and sleep capture, which may increase diagnostic yield. Clinical correlation was suggested.  Physical Exam    Awake alert. Afebrile. Head is nontraumatic. Neck is supple without bruit. Hearing is normal. Cardiac exam no murmur or gallop. Lungs are clear to auscultation. Distal pulses are well felt.   Neurological exam ; Awake  Alert oriented x 3. Normal speech and language except mild increase reaction time to answers..eye movements full without nystagmus.Full visual fields to bedside confrontational testing. Face symmetric.  Tongue midline. Normal strength, tone, reflexes and coordination. Normal sensation. Gait deferred.   ASSESSMENT 78 y.o. male presenting with new onset moderately severe expressive aphasia as well as right visual field defect. Patient has a small left frontal subdural hematoma. He is not felt to have had an acute stroke.. Most likely had a seizure. He is unable to get an MRI due to his pacer. Symptoms appear improved back to baseline.  -knee pain. Requests tylenol. -insomnia. Requests ambien.  Stroke Risk Factors - atrial fibrillation, diabetes mellitus, hyperlipidemia and hypertension  Hospital day # 5  TREATMENT/PLAN -Would recommend long-term treatment with aspirin and Plavix for atrial fibrillation as not a candidate for anticoagulation due to his excessive ETOH use and frequent falls. Will hold these due to acute SDH until blood appears isodense. -Stroke Service will sign off. Follow up with neurology as needed.  Joaquin Music, ANP-BC, GNP-BC [facility] Pager: [phone] 10/07/2011 8:57 AM  Dr. Delia Heady, Stroke Center Medical  Director, has personally reviewed chart, pertinent data, examined the patient and developed the plan of care.

## 2011-10-07 NOTE — Procedures (Signed)
EEG NUMBER:  [id number].  HISTORY:  This is a 78 years old man with slurred speech.  MEDICATIONS:  No anticonvulsant medications.  CONDITION OF RECORDING:  This 16-lead EEG was recorded with the patient in awake and drowsy states.  Background rhythm: background patterns in wakefulness were well organized with a well-sustained posterior dominant rhythm of 9.5 Hz, symmetrical and reactive to eye opening and closing. Drowsiness was associated with mild attenuation of voltage and slowing Of frequencies.  Abnormal potentials: no epileptiform activity or focal slowing was noted.  ACTIVATION PROCEDURES:  Hyperventilation was not performed.  Photic stimulation did not activate tracing.  EKG:  Single-channel of EKG monitoring was unremarkable.  IMPRESSION:  This was a normal awake and drowsy EEG.  A normal EEG does not rule out the clinical diagnosis of epilepsy. If clinically warranted, a repeat extended EEG or ambulatory recording may be obtained for prolonged recording times and sleep capture, which may increase the diagnostic yield.  Clinical correlation was suggested.          ______________________________ Carmell Austria, MD    [id number] D:  10/06/2011 14:38:29  T:  10/06/2011 22:16:50  Job #:  [id number]

## 2011-10-07 NOTE — Progress Notes (Signed)
Patient ID: [name], male   DOB: 06-Oct-1929, 78 y.o.   MRN: [id number] Subjective: I was asked by the medical doctors to see the patient again secondary to his CTA demonstrated a slight enlargement of his left subdural hematoma. Presently the patient is alert and pleasant he denies headaches. His speech is normal. He looks much better.  Objective: Vital signs in last 24 hours: Temp:  [97.6 F (36.4 C)-98.5 F (36.9 C)] 97.7 F (36.5 C) (04/17 1307) Pulse Rate:  [69-70] 70  (04/17 1307) Resp:  [18-19] 18  (04/17 1307) BP: (140-165)/(67-90) 165/85 mmHg (04/17 1307) SpO2:  [98 %-99 %] 98 % (04/17 1307)  Intake/Output from previous day: 04/16 0701 - 04/17 0700 In: 840 [P.O.:480; I.V.:360] Out: -  Intake/Output this shift: Total I/O In: 860 [P.O.:480; I.V.:380] Out: 1000 [Urine:1000]  Physical exam the patient is alert and oriented x3. Glasgow Coma Scale 15. His speech is normal. His pupils are equal round reactive light. His motor strength is normal in all 4 extremities.  The patient CTA done yesterday demonstrates his left abnormal, is slightly larger and there is minimal midline shift  Lab Results:  Basename 10/06/11 0500 10/05/11 0452  WBC 7.8 8.7  HGB 11.5* 11.8*  HCT 33.2* 33.7*  PLT 118* 106*   BMET  Basename 10/06/11 1211 10/06/11 0500 10/05/11 0452  NA -- 138 137  K 3.9 3.3* --  CL -- 102 99  CO2 -- 26 25  GLUCOSE -- 87 85  BUN -- 18 18  CREATININE -- 0.79 0.99  CALCIUM -- 7.5* 7.5*    Studies/Results: Ct Angio Head W/cm &/or Wo Cm  10/06/2011  *RADIOLOGY REPORT*  Clinical Data:  78 year old male with altered mental status and multiple falls found have subdural hematoma.  CT ANGIOGRAPHY HEAD AND NECK  Technique:  Multidetector CT imaging of the head and neck was performed using the standard protocol during bolus administration of intravenous contrast.  Multiplanar CT image reconstructions including MIPs were obtained to evaluate the vascular anatomy.  Carotid stenosis measurements (when applicable) are obtained utilizing NASCET criteria, using the distal internal carotid diameter as the denominator.  Contrast: 80mL OMNIPAQUE IOHEXOL 350 MG/ML SOLN  Comparison:  Head CTs 10/03/2011 and earlier.  CTA NECK  Findings:  Left chest transvenous cardiac pacemaker.  Right chest Port-A-Cath.  Bilateral layering pleural effusions are partially visible.  Lung apices otherwise are clear.  Negative thyroid, larynx, pharynx, parapharyngeal spaces, retropharyngeal space, sublingual space and visualized orbit soft tissues.  The right parotid gland may be surgically absent.  The right submandibular gland is fatty atrophied.  The left parotid and submandibular glands have a more normal appearance.  No cervical lymphadenopathy. Advanced degenerative changes throughout the cervical spine.  Mild paranasal sinus mucosal thickening.  Confluent opacity in the right tympanic cavity and mastoid air cells.  No soft tissue inflammatory changes about the mastoids.  Some mastoid septations may have been eroded near the semicircular canals.  Vascular Findings: Visualized major mediastinal vascular structures appear patent.  Three-vessel arch configuration with mild arch atherosclerosis.  Normal right common carotid artery origin.  Calcified plaque at the right carotid bifurcation.  Mild plaque in the right ICA bulb.  No right ICA origin or bolt stenosis occurs.  The cervical right ICA otherwise is normal.  Mild calcified plaque at the right vertebral artery origin with no significant stenosis.  Normal cervical right vertebral artery otherwise.  Normal left common carotid artery origin.  Calcified plaque at the left carotid  bifurcation.  Some involvement of the left ICA origin and bulb, but no left ICA stenosis occurs, and the downstream cervical left ICA is within normal limits.  Calcified plaque at the left vertebral artery origin resulting in mild stenosis.  Mild tortuosity of the distal left  vertebral artery without stenosis.   Review of the MIP images confirms the above findings.  IMPRESSION: 1.  Mild for age neck atherosclerosis.  No significant arterial stenosis in the neck. 2.  Intracranial findings are below. 3.  Inflammatory changes in the right tympanic cavity and mastoids with only minimal improvement since 08/22/2011.  ENT follow-up may be valuable. 4.  Layering pleural effusions.  CTA HEAD  Findings:  Low density right extra-axial collection measures up to 7 mm in thickness and is stable since 08/22/2011.  Extend to the left subdural hematoma has hyperdense blood products posteriorly measuring up to 6 mm in thickness and a predominately low density component laterally measuring up to 9 mm in thickness.  There is a small volume of subdural hemorrhage along the interhemispheric fissure posteriorly.  3 mm of rightward midline shift has slightly increased since 10/02/2011.  A trace amount of subarachnoid hemorrhage may also be present on the left (series 3 image 24).  No intraventricular hemorrhage.  No ventriculomegaly.  Chronic left inferior frontal gyrus encephalomalacia.  Stable posterior fossa structures since 08/22/2011. No evidence of cortically based acute infarction identified.  No abnormal enhancement identified.  Stable visualized osseous structures.  Vascular Findings: Major intracranial venous structures are enhancing.  Distal vertebral arteries are patent with only mild irregularity. Normal bilateral PICA vessels.  Normal vertebrobasilar junction. Mild irregularity and tapering of the distal basilar.  Normal superior cerebellar arteries and PCA origins.  Posterior communicating arteries are diminutive or absent.  Bilateral PCA branches are within normal limits.  Both ICA siphons are calcified.  Despite this, in stenosis of less than 50 % with respect to the distal vessel.  Ophthalmic artery origins are within normal limits.  Carotid termini are patent. Dominant right ACA A1 segment.   Anterior communicating artery is within normal limits.  A dominant median artery of the corpus callosum is present.  Otherwise ACA branches are within normal limits.  There is calcified plaque at the right MCA bifurcation. Otherwise the bilateral MCA branches are within normal limits.   Review of the MIP images confirms the above findings.  IMPRESSION: 1.  Mixed density left subdural hematoma with slight increased size and progression of rightward midline shift (3 mm) since 10/03/2011. Trace left subarachnoid hemorrhage. 2.  Low density right subdural hematoma/hygroma is stable since 08/22/2011. 3.  Negative intracranial CTA except for ICA siphon and right MCA bifurcation atherosclerosis.  Original Report Authenticated By: Harley Hallmark, M.D.   Ct Angio Neck W/cm &/or Wo/cm  10/06/2011  *RADIOLOGY REPORT*  Clinical Data:  78 year old male with altered mental status and multiple falls found have subdural hematoma.  CT ANGIOGRAPHY HEAD AND NECK  Technique:  Multidetector CT imaging of the head and neck was performed using the standard protocol during bolus administration of intravenous contrast.  Multiplanar CT image reconstructions including MIPs were obtained to evaluate the vascular anatomy. Carotid stenosis measurements (when applicable) are obtained utilizing NASCET criteria, using the distal internal carotid diameter as the denominator.  Contrast: 80mL OMNIPAQUE IOHEXOL 350 MG/ML SOLN  Comparison:  Head CTs 10/03/2011 and earlier.  CTA NECK  Findings:  Left chest transvenous cardiac pacemaker.  Right chest Port-A-Cath.  Bilateral layering pleural effusions are  partially visible.  Lung apices otherwise are clear.  Negative thyroid, larynx, pharynx, parapharyngeal spaces, retropharyngeal space, sublingual space and visualized orbit soft tissues.  The right parotid gland may be surgically absent.  The right submandibular gland is fatty atrophied.  The left parotid and submandibular glands have a more normal  appearance.  No cervical lymphadenopathy. Advanced degenerative changes throughout the cervical spine.  Mild paranasal sinus mucosal thickening.  Confluent opacity in the right tympanic cavity and mastoid air cells.  No soft tissue inflammatory changes about the mastoids.  Some mastoid septations may have been eroded near the semicircular canals.  Vascular Findings: Visualized major mediastinal vascular structures appear patent.  Three-vessel arch configuration with mild arch atherosclerosis.  Normal right common carotid artery origin.  Calcified plaque at the right carotid bifurcation.  Mild plaque in the right ICA bulb.  No right ICA origin or bolt stenosis occurs.  The cervical right ICA otherwise is normal.  Mild calcified plaque at the right vertebral artery origin with no significant stenosis.  Normal cervical right vertebral artery otherwise.  Normal left common carotid artery origin.  Calcified plaque at the left carotid bifurcation.  Some involvement of the left ICA origin and bulb, but no left ICA stenosis occurs, and the downstream cervical left ICA is within normal limits.  Calcified plaque at the left vertebral artery origin resulting in mild stenosis.  Mild tortuosity of the distal left vertebral artery without stenosis.   Review of the MIP images confirms the above findings.  IMPRESSION: 1.  Mild for age neck atherosclerosis.  No significant arterial stenosis in the neck. 2.  Intracranial findings are below. 3.  Inflammatory changes in the right tympanic cavity and mastoids with only minimal improvement since 08/22/2011.  ENT follow-up may be valuable. 4.  Layering pleural effusions.  CTA HEAD  Findings:  Low density right extra-axial collection measures up to 7 mm in thickness and is stable since 08/22/2011.  Extend to the left subdural hematoma has hyperdense blood products posteriorly measuring up to 6 mm in thickness and a predominately low density component laterally measuring up to 9 mm in  thickness.  There is a small volume of subdural hemorrhage along the interhemispheric fissure posteriorly.  3 mm of rightward midline shift has slightly increased since 10/02/2011.  A trace amount of subarachnoid hemorrhage may also be present on the left (series 3 image 24).  No intraventricular hemorrhage.  No ventriculomegaly.  Chronic left inferior frontal gyrus encephalomalacia.  Stable posterior fossa structures since 08/22/2011. No evidence of cortically based acute infarction identified.  No abnormal enhancement identified.  Stable visualized osseous structures.  Vascular Findings: Major intracranial venous structures are enhancing.  Distal vertebral arteries are patent with only mild irregularity. Normal bilateral PICA vessels.  Normal vertebrobasilar junction. Mild irregularity and tapering of the distal basilar.  Normal superior cerebellar arteries and PCA origins.  Posterior communicating arteries are diminutive or absent.  Bilateral PCA branches are within normal limits.  Both ICA siphons are calcified.  Despite this, in stenosis of less than 50 % with respect to the distal vessel.  Ophthalmic artery origins are within normal limits.  Carotid termini are patent. Dominant right ACA A1 segment.  Anterior communicating artery is within normal limits.  A dominant median artery of the corpus callosum is present.  Otherwise ACA branches are within normal limits.  There is calcified plaque at the right MCA bifurcation. Otherwise the bilateral MCA branches are within normal limits.   Review of the  MIP images confirms the above findings.  IMPRESSION: 1.  Mixed density left subdural hematoma with slight increased size and progression of rightward midline shift (3 mm) since 10/03/2011. Trace left subarachnoid hemorrhage. 2.  Low density right subdural hematoma/hygroma is stable since 08/22/2011. 3.  Negative intracranial CTA except for ICA siphon and right MCA bifurcation atherosclerosis.  Original Report  Authenticated By: Harley Hallmark, M.D.    Assessment/Plan: Slight enlargement of the patient's left subdural hematoma: The patient is presently asymptomatic from this. He does not require any intervention presently. Please have the patient followup with me office and follow him with serial CT scans. Please call [phone] if I can be of further assistance.  LOS: 5 days     Jesseca Marsch D 10/07/2011, 2:08 PM

## 2011-10-07 NOTE — Progress Notes (Signed)
[facility] PROGRESS NOTE  ESAW KNIPPEL ZOX:[id number] DOB: [birth year] DOA: 10/02/2011 PCP: No primary provider on file. Oncologist: D. Murinson, MD  Assessment/Plan: 1. Confusion: Resolved.Thought to be secondary to Ambien. Discontinue Ambien. Restraints discontinued. 2. Delirium tremens: Resolved. Treated with Precedex.  3. Subdural hematoma: Observed per neurosurgery. Aspirin and Plavix discontinued. Repeat CT scan April 15 showed shows slight increase in size with progression of rightward midline shift of 3 mm. Neurosurgery will further evaluate today. Discussed with Dr. Lovell Sheehan. History of closed head injury approximately 2 weeks prior to admission and laceration repair. 4. Severe expressive aphasia/right visual field defect: Suspected seizure versus acute stroke left MCA per neurology. Long-term treatment with aspirin and Plavix, currently on hold because his subdural hematoma. 5. Possible UTI: Urine culture on revealing. Empiric Rocephin discontinued. 6. Hypomagnesemia: Replete. 7. Transaminitis: Most likely secondary to alcoholic hepatitis. Followup as outpatient. 8. Thrombocytopenia: Stable. Probably secondary to alcohol. Follow up as an outpatient. 9. Diabetes mellitus type 2: Stable. Continue sliding scale insulin. Hemoglobin A1c 5.6. 10. History of atrial fibrillation: Not a warfarin candidate secondary to alcoholism and frequent falls as well as subdural hematoma. 11. History pacemaker placement 12. Alcoholism: Recommend cessation 13. Frequent falls 14. History of right parotid surgery and inflammatory changes: Tympanic cavity and mastoid inflammatory changes again noted. Follow up with ENT as an outpatient.  Code Status: Full code Family Communication: None at bedside. Disposition Plan: Skilled nursing facility when    Brendia Sacks, MD  [facility] Pager [phone] 8AM-8PM  If 8PM-8AM, please contact  floor/night-coverage [email or web address] Password [email or web address] 10/07/2011, 10:04 AM    LOS: 5 days   Brief narrative: 78 year old man presented to the emergency department with confusion and garbled speech. CT of the head revealed a small subdural hematoma and neurosurgery and neurology consultations were obtained. Subsequent CT demonstrates stability of the subdural hematoma and neurosurgery signed off. No intervention was worth it except for the discontinuation of aspirin and Plavix. This patient per neurology suspected to have had a stroke. He developed alcohol withdrawal approximately 48 hours into admission and was transferred briefly to the critical care service and placed on a Precedex.  Past medical history: Alcoholism, frequent falls, transitional cell carcinoma of the bladder, carcinoma right parotid gland, pacemaker implantation 4 history of intermittent complete heart block, TIA, spontaneous subdural hematoma, paroxysmal atrial fibrillation unable to tolerate Betapace or amiodarone, diabetes mellitus  Consultants:  Neurology  Neurosurgery  Pulmonary critical care medicine  Physical therapy: Skilled nursing facility Rolling walker with 5 inch wheels.  Occupational therapy: Skilled nursing facility  Speech therapy: Regular diet. And liquids.  Procedures:  2-D echocardiogram: Left ventricular ejection fraction 55-60%.  Bilateral carotid ultrasound: No significant internal carotid artery stenosis. Vertebral artery flow antegrade.  EEG: Normal awake and drowsy EEG.  Interim History: Chart reviewed in detail and summarized as above. Required restraints overnight secondary to agitation and combativeness. Afebrile, vital signs stable. Discussed with nurse: No issues this morning. Restraints removed.  Subjective: No complaints.  Objective: Filed Vitals:   10/06/11 2120 10/07/11 0000 10/07/11 0404 10/07/11 0410  BP: 140/83  165/67 152/90  Pulse: 70  69   Temp:  97.6 F (36.4 C)  98.5 F (36.9 C)   TempSrc: Oral Oral Oral   Resp: 19  19   Height:      Weight:      SpO2: 99%  98%     Intake/Output Summary (Last 24 hours) at 10/07/11 1004 Last data filed at 10/07/11 8295  Gross per 24 hour  Intake    600 ml  Output   1000 ml  Net   -400 ml    Exam:   General:  Calm and comfortable.  Eyes: Pupils round and react to light. Left pupil 4 mm. Right pupil 3 mm.  Cardiovascular: Regular rate and rhythm. No murmur, rub, gallop. No lower extremity edema.  Telemetry: Paced rhythm.  Respiratory: Clear to auscultation bilaterally. No wheezes, rales, rhonchi. Normal respiratory effort.  Musculoskeletal: Grossly normal tone and strength upper and lower extremities bilaterally.  Psychiatric: Grossly normal mood and affect. Alert and oriented to self, month, year, present. Confused to location.  Neurologic: Cranial nerves appear to be intact except as noted above. Neurology exam nonfocal.  Data Reviewed: Basic Metabolic Panel:  Lab 10/06/11 1610 10/06/11 0500 10/05/11 0452 10/02/11 2044  NA -- 138 137 135  K 3.9 3.3* -- --  CL -- 102 99 93*  CO2 -- 26 25 25   GLUCOSE -- 87 85 125*  BUN -- 18 18 13   CREATININE -- 0.79 0.99 0.74  CALCIUM -- 7.5* 7.5* 8.4  MG 1.3* 0.9* -- --  PHOS -- -- 4.7* --   Liver Function Tests:  Lab 10/06/11 0500 10/05/11 0452 10/02/11 2044  AST 247* 338* 48*  ALT 124* 104* 21  ALKPHOS 96 91 113  BILITOT 1.8* 1.8* 2.5*  PROT 5.3* 5.8* 6.8  ALBUMIN 2.6* 2.9* 3.6   CBC:  Lab 10/06/11 0500 10/05/11 0452 10/02/11 2044  WBC 7.8 8.7 5.5  NEUTROABS -- -- 4.7  HGB 11.5* 11.8* 13.3  HCT 33.2* 33.7* 37.4*  MCV 103.4* 100.6* 100.8*  PLT 118* 106* 125*   Cardiac Enzymes:  Lab 10/03/11 0031 10/02/11 2044  CKTOTAL 469* --  CKMB 3.0 --  CKMBINDEX -- --  TROPONINI <0.30 <0.30   CBG:  Lab 10/07/11 0533 10/06/11 2123 10/06/11 1617 10/06/11 1110 10/06/11 0628  GLUCAP 130* 136* 108* 161* 87    Recent Results (from the past  240 hour(s))  URINE CULTURE     Status: Normal   Collection Time   10/02/11  8:07 PM      Component Value Range Status Comment   Specimen Description URINE, RANDOM   Final    Special Requests NONE   Final    Culture  Setup Time [id number]   Final    Colony Count 20,OOO COLONIES/ML   Final    Culture     Final    Value: Multiple bacterial morphotypes present, none predominant. Suggest appropriate recollection if clinically indicated.   Report Status 10/04/2011 FINAL   Final   CULTURE, BLOOD (ROUTINE X 2)     Status: Normal (Preliminary result)   Collection Time   10/03/11 12:25 AM      Component Value Range Status Comment   Specimen Description BLOOD RIGHT ARM   Final    Special Requests BOTTLES DRAWN AEROBIC AND ANAEROBIC 10CC EACH   Final    Culture  Setup Time [id number]   Final    Culture     Final    Value:        BLOOD CULTURE RECEIVED NO GROWTH TO DATE CULTURE WILL BE HELD FOR 5 DAYS BEFORE ISSUING A FINAL NEGATIVE REPORT   Report Status PENDING   Incomplete   CULTURE, BLOOD (ROUTINE X 2)     Status: Normal (Preliminary result)   Collection Time   10/03/11 12:35 AM      Component Value Range Status Comment  Specimen Description BLOOD LEFT HAND   Final    Special Requests BOTTLES DRAWN AEROBIC AND ANAEROBIC 10CC EACH   Final    Culture  Setup Time [id number]   Final    Culture     Final    Value:        BLOOD CULTURE RECEIVED NO GROWTH TO DATE CULTURE WILL BE HELD FOR 5 DAYS BEFORE ISSUING A FINAL NEGATIVE REPORT   Report Status PENDING   Incomplete      Studies: Ct Angio Head W/cm &/or Wo Cm  10/06/2011  *RADIOLOGY REPORT*  Clinical Data:  78 year old male with altered mental status and multiple falls found have subdural hematoma.  CT ANGIOGRAPHY HEAD AND NECK   IMPRESSION: 1.  Mild for age neck atherosclerosis.  No significant arterial stenosis in the neck. 2.  Intracranial findings are below. 3.  Inflammatory changes in the right tympanic cavity and mastoids with only  minimal improvement since 08/22/2011.  ENT follow-up may be valuable. 4.  Layering pleural effusions.   CTA HEAD  IMPRESSION: 1.  Mixed density left subdural hematoma with slight increased size and progression of rightward midline shift (3 mm) since 10/03/2011. Trace left subarachnoid hemorrhage. 2.  Low density right subdural hematoma/hygroma is stable since 08/22/2011. 3.  Negative intracranial CTA except for ICA siphon and right MCA bifurcation atherosclerosis.  Original Report Authenticated By: Harley Hallmark, M.D.   Scheduled Meds:   . antiseptic oral rinse  15 mL Mouth Rinse BID  . atorvastatin  40 mg Oral Daily  . digoxin  125 mcg Oral Daily  . insulin aspart  0-9 Units Subcutaneous TID WC  . metoprolol succinate  50 mg Oral Daily  . mulitivitamin with minerals  1 tablet Oral Daily  . pantoprazole  40 mg Oral Daily  . ramipril  1.25 mg Oral Daily  . thiamine  100 mg Oral Daily   Continuous Infusions:   . sodium chloride 20 mL/hr (10/04/11 1200)

## 2011-10-07 NOTE — Progress Notes (Signed)
Pt highly agitated and combative, trying to kick staff. Pt trying to get legs out of bed with wrist restraints on. Took one restraint off to change pt bed, pt stood up and would not sit down. Had to call security, pt having delusions and does not know he is in hospital. Security got pt back in bed, and wrist restraints were applied to patient. Pt now resting in bed. Will continue to monitor.

## 2011-10-07 NOTE — Progress Notes (Signed)
Pt. Trying to get out of bed, and being combative with staff. Tried alternative measures to keep pt saft, but pt would still not calm down. MD called and gave order to place pt in nonviolent medical restraints. Restraints placed at 0015, will continue to monitor pt.

## 2011-10-08 LAB — GLUCOSE, CAPILLARY
Glucose-Capillary: 124 mg/dL — ABNORMAL HIGH (ref 70–99)
Glucose-Capillary: 153 mg/dL — ABNORMAL HIGH (ref 70–99)
Glucose-Capillary: 108 mg/dL — ABNORMAL HIGH (ref 70–99)

## 2011-10-08 NOTE — Progress Notes (Signed)
Utilization review complete 

## 2011-10-08 NOTE — Progress Notes (Signed)
Physical Therapy Treatment Patient Details Name: CAP MASSI MRN: [id number] DOB: 1930/02/06 Today's Date: 10/08/2011  PT Assessment/Plan  PT - Assessment/Plan Comments on Treatment Session: Patient s/p Left SDH with decr mobility secondary to decr balance and decr endurance.  Will benefit from continued PT.  Needs NHP.   PT Plan: Discharge plan remains appropriate;Frequency remains appropriate PT Frequency: Min 3X/week Follow Up Recommendations: Skilled nursing facility;Supervision/Assistance - 24 hour Equipment Recommended: Defer to next venue PT Goals  Acute Rehab PT Goals PT Goal: Sit to Stand - Progress: Progressing toward goal PT Goal: Stand to Sit - Progress: Progressing toward goal PT Transfer Goal: Bed to Chair/Chair to Bed - Progress: Progressing toward goal PT Goal: Ambulate - Progress: Progressing toward goal  PT Treatment Precautions/Restrictions  Precautions Precautions: Fall Restrictions Weight Bearing Restrictions: No Mobility (including Balance) Bed Mobility Supine to Sit: Not tested (comment) Sitting - Scoot to Edge of Bed: Not tested (comment) Transfers Sit to Stand: 1: +2 Total assist;Patient percentage (comment);With upper extremity assist;From chair/3-in-1;With armrests (pt = 50%) Sit to Stand Details (indicate cue type and reason): cues for hand placement Stand to Sit: 4: Min assist;With upper extremity assist;With armrests;To chair/3-in-1 Stand to Sit Details: cues for hand placement and assist to control descent Ambulation/Gait Ambulation/Gait Assistance: 4: Min assist Ambulation/Gait Assistance Details (indicate cue type and reason): min for steadyting assist only.  Needed cues for posture and to stay close to RW.   Ambulation Distance (Feet): 250 Feet Assistive device: Rolling walker Gait Pattern: Step-to pattern;Decreased stride length;Trunk flexed;Shuffle Stairs: No Corporate treasurer: No  Posture/Postural  Control Posture/Postural Control: No significant limitations Balance Balance Assessed: No    End of Session PT - End of Session Equipment Utilized During Treatment: Gait belt Activity Tolerance: Patient tolerated treatment well;Patient limited by fatigue Patient left: in chair;with call bell in reach Nurse Communication: Mobility status for transfers;Mobility status for ambulation General Behavior During Session: [facility] for tasks performed Cognition: Impaired Cognitive Impairment: decr safety awareness  INGOLD,Ova Meegan 10/08/2011, 11:51 AMDawn Ingold,PT Acute Rehabilitation [phone] (pager)

## 2011-10-08 NOTE — Progress Notes (Signed)
[facility] PROGRESS NOTE  [name] ZOX:[id number] DOB: 1929-11-17 DOA: 10/02/2011 PCP: No primary provider on file. Oncologist: D. Murinson, MD  Assessment/Plan: 1. Confusion: Resolved.Thought to be secondary to Ambien. Ambien has been discontinued.  2. Delirium tremens: Resolved. Treated with Precedex.  3. Subdural hematoma: No further inpatient evaluation per neurosurgery. Followup with neurosurgery as an outpatient with serial head CT scans. Aspirin and Plavix discontinued. History of closed head injury approximately 2 weeks prior to admission and laceration repair. 4. Severe expressive aphasia/right visual field defect: Resolved. Suspected seizure versus acute stroke left MCA per neurology. Long-term treatment with aspirin and Plavix, currently on hold because his subdural hematoma. 5. Possible UTI: Urine culture unrevealing. Empiric Rocephin discontinued. 6. Hypomagnesemia: Repleted. 7. Transaminitis: Most likely secondary to alcoholic hepatitis. Followup as outpatient. 8. Thrombocytopenia: Stable. Probably secondary to alcohol. Follow up as an outpatient. 9. Diabetes mellitus type 2: Stable. Continue sliding scale insulin. Hemoglobin A1c 5.6. 10. History of atrial fibrillation: Not a warfarin candidate secondary to alcoholism and frequent falls as well as subdural hematoma. 11. History pacemaker placement 12. Alcoholism: Recommend cessation 13. Frequent falls 14. History of right parotid surgery and inflammatory changes: Tympanic cavity and mastoid inflammatory changes again noted. Follow up with ENT as an outpatient.  Code Status: Full code Family Communication: None at bedside. Disposition Plan: Skilled nursing facility when bed available. Medically stable for discharge.   Brendia Sacks, MD  [facility] Pager [phone] 8AM-8PM  If 8PM-8AM, please contact floor/night-coverage [email or web address] Password [email or web address] 10/08/2011, 1:27 PM  LOS: 6 days     Brief narrative: 78 year old man presented to the emergency department with confusion and garbled speech. CT of the head revealed a small subdural hematoma and neurosurgery and neurology consultations were obtained. Subsequent CT demonstrates stability of the subdural hematoma and neurosurgery signed off. No intervention was worth it except for the discontinuation of aspirin and Plavix. This patient per neurology suspected to have had a stroke. He developed alcohol withdrawal approximately 48 hours into admission and was transferred briefly to the critical care service and placed on a Precedex.  Past medical history: Alcoholism, frequent falls, transitional cell carcinoma of the bladder, carcinoma right parotid gland, pacemaker implantation 4 history of intermittent complete heart block, TIA, spontaneous subdural hematoma, paroxysmal atrial fibrillation unable to tolerate Betapace or amiodarone, diabetes mellitus  Consultants:  Neurology  Neurosurgery  Pulmonary critical care medicine  Physical therapy: Skilled nursing facility Rolling walker with 5 inch wheels.  Occupational therapy: Skilled nursing facility  Speech therapy: Regular diet. And liquids.  Procedures:  2-D echocardiogram: Left ventricular ejection fraction 55-60%.  Bilateral carotid ultrasound: No significant internal carotid artery stenosis. Vertebral artery flow antegrade.  EEG: Normal awake and drowsy EEG.  Interim History: Interval documentation review. Appreciate neurosurgery evaluation and recommendations.  Subjective: No complaints.  Objective: Filed Vitals:   10/07/11 0410 10/07/11 1307 10/07/11 2146 10/08/11 0420  BP: 152/90 165/85 153/86 139/83  Pulse:  70 69 69  Temp:  97.7 F (36.5 C) 98.2 F (36.8 C) 99 F (37.2 C)  TempSrc:  Oral Oral Oral  Resp:  18 18 18   Height:      Weight:      SpO2:  98% 97% 96%    Intake/Output Summary (Last 24 hours) at 10/08/11 1327 Last data filed at  10/08/11 0900  Gross per 24 hour  Intake   1170 ml  Output   2700 ml  Net  -1530 ml    Exam:  General:  Calm and comfortable.  Cardiovascular: Regular rate and rhythm. No murmur, rub, gallop.   Respiratory: Clear to auscultation bilaterally. No wheezes, rales, rhonchi. Normal respiratory effort.  Psychiatric: Grossly normal mood and affect.  Data Reviewed: Basic Metabolic Panel:  Lab 10/06/11 1478 10/06/11 0500 10/05/11 0452 10/02/11 2044  NA -- 138 137 135  K 3.9 3.3* -- --  CL -- 102 99 93*  CO2 -- 26 25 25   GLUCOSE -- 87 85 125*  BUN -- 18 18 13   CREATININE -- 0.79 0.99 0.74  CALCIUM -- 7.5* 7.5* 8.4  MG 1.3* 0.9* -- --  PHOS -- -- 4.7* --   Liver Function Tests:  Lab 10/06/11 0500 10/05/11 0452 10/02/11 2044  AST 247* 338* 48*  ALT 124* 104* 21  ALKPHOS 96 91 113  BILITOT 1.8* 1.8* 2.5*  PROT 5.3* 5.8* 6.8  ALBUMIN 2.6* 2.9* 3.6   CBC:  Lab 10/06/11 0500 10/05/11 0452 10/02/11 2044  WBC 7.8 8.7 5.5  NEUTROABS -- -- 4.7  HGB 11.5* 11.8* 13.3  HCT 33.2* 33.7* 37.4*  MCV 103.4* 100.6* 100.8*  PLT 118* 106* 125*   Cardiac Enzymes:  Lab 10/03/11 0031 10/02/11 2044  CKTOTAL 469* --  CKMB 3.0 --  CKMBINDEX -- --  TROPONINI <0.30 <0.30   CBG:  Lab 10/08/11 1137 10/08/11 0609 10/07/11 2142 10/07/11 1633 10/07/11 1108  GLUCAP 124* 111* 170* 130* 105*    Recent Results (from the past 240 hour(s))  URINE CULTURE     Status: Normal   Collection Time   10/02/11  8:07 PM      Component Value Range Status Comment   Specimen Description URINE, RANDOM   Final    Special Requests NONE   Final    Culture  Setup Time [id number]   Final    Colony Count 20,OOO COLONIES/ML   Final    Culture     Final    Value: Multiple bacterial morphotypes present, none predominant. Suggest appropriate recollection if clinically indicated.   Report Status 10/04/2011 FINAL   Final   CULTURE, BLOOD (ROUTINE X 2)     Status: Normal (Preliminary result)   Collection Time    10/03/11 12:25 AM      Component Value Range Status Comment   Specimen Description BLOOD RIGHT ARM   Final    Special Requests BOTTLES DRAWN AEROBIC AND ANAEROBIC 10CC EACH   Final    Culture  Setup Time [id number]   Final    Culture     Final    Value:        BLOOD CULTURE RECEIVED NO GROWTH TO DATE CULTURE WILL BE HELD FOR 5 DAYS BEFORE ISSUING A FINAL NEGATIVE REPORT   Report Status PENDING   Incomplete   CULTURE, BLOOD (ROUTINE X 2)     Status: Normal (Preliminary result)   Collection Time   10/03/11 12:35 AM      Component Value Range Status Comment   Specimen Description BLOOD LEFT HAND   Final    Special Requests BOTTLES DRAWN AEROBIC AND ANAEROBIC 10CC EACH   Final    Culture  Setup Time [id number]   Final    Culture     Final    Value:        BLOOD CULTURE RECEIVED NO GROWTH TO DATE CULTURE WILL BE HELD FOR 5 DAYS BEFORE ISSUING A FINAL NEGATIVE REPORT   Report Status PENDING   Incomplete      Studies: Ct  Angio Head W/cm &/or Wo Cm  10/06/2011  *RADIOLOGY REPORT*  Clinical Data:  78 year old male with altered mental status and multiple falls found have subdural hematoma.  CT ANGIOGRAPHY HEAD AND NECK   IMPRESSION: 1.  Mild for age neck atherosclerosis.  No significant arterial stenosis in the neck. 2.  Intracranial findings are below. 3.  Inflammatory changes in the right tympanic cavity and mastoids with only minimal improvement since 08/22/2011.  ENT follow-up may be valuable. 4.  Layering pleural effusions.   CTA HEAD  IMPRESSION: 1.  Mixed density left subdural hematoma with slight increased size and progression of rightward midline shift (3 mm) since 10/03/2011. Trace left subarachnoid hemorrhage. 2.  Low density right subdural hematoma/hygroma is stable since 08/22/2011. 3.  Negative intracranial CTA except for ICA siphon and right MCA bifurcation atherosclerosis.  Original Report Authenticated By: Harley Hallmark, M.D.   Scheduled Meds:    . antiseptic oral rinse  15 mL  Mouth Rinse BID  . atorvastatin  40 mg Oral Daily  . digoxin  125 mcg Oral Daily  . insulin aspart  0-9 Units Subcutaneous TID WC  . magnesium oxide  400 mg Oral BID  . metoprolol succinate  50 mg Oral Daily  . mulitivitamin with minerals  1 tablet Oral Daily  . pantoprazole  40 mg Oral Daily  . ramipril  1.25 mg Oral Daily  . thiamine  100 mg Oral Daily   Continuous Infusions:    . sodium chloride 20 mL/hr (10/08/11 1117)

## 2011-10-09 LAB — CULTURE, BLOOD (ROUTINE X 2)
Culture  Setup Time: 201304130351
Culture: NO GROWTH

## 2011-10-09 LAB — GLUCOSE, CAPILLARY
Glucose-Capillary: 132 mg/dL — ABNORMAL HIGH (ref 70–99)
Glucose-Capillary: 99 mg/dL (ref 70–99)

## 2011-10-09 MED ORDER — HEPARIN SOD (PORK) LOCK FLUSH 100 UNIT/ML IV SOLN
500.0000 [IU] | INTRAVENOUS | Status: DC | PRN
  Filled 2011-10-09: qty 5

## 2011-10-09 MED ORDER — RAMIPRIL 1.25 MG PO CAPS: 1.2500 mg | ORAL_CAPSULE | Freq: Every day | ORAL | Status: DC

## 2011-10-09 MED ORDER — METOPROLOL SUCCINATE ER 25 MG PO TB24: 50.0000 mg | ORAL_TABLET | Freq: Every day | ORAL | Status: DC

## 2011-10-09 MED ORDER — HEPARIN SOD (PORK) LOCK FLUSH 100 UNIT/ML IV SOLN
500.0000 [IU] | INTRAVENOUS | Status: DC
  Administered 2011-10-09: 500 [IU]
  Filled 2011-10-09: qty 5

## 2011-10-09 NOTE — Clinical Social Work Note (Addendum)
[name] is medically ready for discharge to [facility] today. Patient's daughter chose facility and has completed admissions paperwork. Discharge information forwarded to [facility]. CSW will facilitate transportation to facility by ambulance. Family aware of discharge.  Genelle Bal, MSW, LCSW [phone]

## 2011-10-09 NOTE — Clinical Social Work Placement (Signed)
Clinical Social Work Department CLINICAL SOCIAL WORK PLACEMENT NOTE 10/09/2011  Patient:  [name], [name]  Account Number:  [id number] Admit date:  10/02/2011  Clinical Social Worker:  Genelle Bal, LCSW  Date/time:  10/09/2011 01:35 PM  Clinical Social Work is seeking post-discharge placement for this patient at the following level of care:   SKILLED NURSING   (*CSW will update this form in Epic as items are completed)   10/08/2011  Patient/family provided with [facility] Department of Clinical Social Work's list of facilities offering this level of care within the geographic area requested by the patient (or if unable, by the patient's family).  10/08/2011  Patient/family informed of their freedom to choose among providers that offer the needed level of care, that participate in Medicare, Medicaid or managed care program needed by the patient, have an available bed and are willing to accept the patient.    Patient/family informed of [facility]' ownership interest in [facility], as well as of the fact that they are under no obligation to receive care at this facility.  PASARR submitted to EDS on 10/08/2011 PASARR number received from [employer] on 10/08/2011  FL2 transmitted to all facilities in geographic area requested by pt/family on  10/08/2011 FL2 transmitted to all facilities within larger geographic area on   Patient informed that his/her managed care company has contracts with or will negotiate with  certain facilities, including the following:     Patient/family informed of bed offers received:  10/09/2011 Patient chooses bed at [facility] Physician recommends and patient chooses bed at    Patient to be transferred to [facility] on  10/09/2011 Patient to be transferred to facility by ambulance  The following physician request were entered in Epic:   Additional Comments: Daughter has completed paperwork at [facility]

## 2011-10-09 NOTE — Progress Notes (Addendum)
Clinical Social Work Department BRIEF PSYCHOSOCIAL ASSESSMENT 10/09/2011  Patient:  [name], [name]     Account Number:  [id number]     Admit date:  10/02/2011  Clinical Social Worker:  Delmer Islam  Date/Time:  10/08/2011 10:00 AM  Referred by:  Physician  Date Referred:  10/07/2011 Referred for  SNF Placement   Other Referral:   Interview type:   Other interview type:   Also talked with daughter Coolidge Breeze ([phone])    PSYCHOSOCIAL DATA Living Status:  FAMILY Admitted from facility:   Level of care:   Primary support name:  Coolidge Breeze Primary support relationship to patient:  CHILD, ADULT Degree of support available:   Patient also has a daughter Selena Batten. Patient has good support from his daughters. He lives with his daughter Cordelia Pen.    CURRENT CONCERNS Current Concerns  Post-Acute Placement   Other Concerns:   ETOH abuse    SOCIAL WORK ASSESSMENT / PLAN On 4/18 CSW talked with patient and later daughter (by phone) regarding SNF placement for short-term rehab. Patient was initially in agreement, then later while talking with daughter was adamant that he was going home. Daughters understand that patient is unsafe to go home at this time and daughter is checking on facilities for patient. Daughter's preference was [facility] and she and sister visited facility on 4/18, but [facility] did not extend a bed offer. Daughter given other bed offers on 4/19 and advised that MD has discharged patient. Daughter is visiting facilities to make a decision.   Assessment/plan status:  Psychosocial Support/Ongoing Assessment of Needs Other assessment/ plan:   Information/referral to community resources:   Patient given SNF listand daughter given facilities by phone.    PATIENT'S/FAMILY'S RESPONSE TO PLAN OF CARE: Patient open to talking with CSW but wants to go home to get paperwork out of his gun safe. Daughter is aware and does not intend to take him home before going to a SNF as  she knows that he will not leave the home once he gets there.

## 2011-10-09 NOTE — Discharge Summary (Addendum)
Physician Discharge Summary  [name]:[id number] DOB: Nov 06, 1929 DOA: 10/02/2011  PCP: [name], MD, MD Neurosurgeon: Tressie Stalker, M.D. Oncologist: Dr. Arline Asp  Cardiologist: Dr. Susa Griffins. ENT: Dr. Kathreen Devoid  Admit date: 10/02/2011 Discharge date: 10/09/2011  Discharge Diagnoses:  1. Subdural hematoma, trace subarachnoid hemorrhage 2. Severe expressive aphasia/right visual field defect, resolved 3. Delirium tremens, resolved  4. Transaminitis 5. Thrombocytopenia 6. Alcoholism 7. Frequent falls as an outpatient 8. Diabetes mellitus type 2, stable  Discharge Condition: Improved  Disposition: Skilled nursing facility for rehabilitation  History of present illness:  78 year old man presented to the emergency department with confusion and garbled speech. CT of the head revealed a small subdural hematoma and neurosurgery and neurology consultations were obtained.   Hospital Course:  Mr. [name] was admitted and followed by neurosurgery and neurology. Subsequent CT demonstrates stability of the subdural hematoma and neurosurgery signed off--recommended outpatient followup with serial head CTs. No intervention recommended except for the discontinuation of aspirin and Plavix. Patient was noted initially to have severe expressive aphasia and a right visual field defect which has subsequently resolved and per neurology the etiology is suspected to be seizure versus acute stroke. In the long run aspirin and Plavix should be considered but at this time are deferred because of his subdural hematoma. This hospitalization was complicated by alcohol withdrawal approximately 48 hours into admission and he was transferred briefly to the critical care service and placed on a Precedex. Condition quickly improved. At this point the patient is stableto transfer to skilled nursing facility for short-term rehabilitation. No anticoagulants or antiplatelet agents for  now. 1. Subdural hematoma: No further inpatient evaluation per neurosurgery. Followup with neurosurgery as an outpatient with serial head CT scans. Aspirin and Plavix discontinued.  2. Severe expressive aphasia/right visual field defect: Resolved. Suspected seizure versus acute stroke left MCA per neurology. Long-term treatment with aspirin and Plavix, currently on hold because his subdural hematoma. Resume once cleared by neurosurgery.  3. Delirium tremens: Resolved. Treated with Precedex.   4. Possible UTI: Urine culture unrevealing. Empiric Rocephin discontinued.  5. Hypomagnesemia: Repleted.  6. Transaminitis: Most likely secondary to alcoholic hepatitis. Followup as outpatient.  7. Thrombocytopenia: Stable. Probably secondary to alcohol. Follow up as an outpatient.  8. Diabetes mellitus type 2: Stable. Continue sliding scale insulin. Hemoglobin A1c 5.6.  9. History of atrial fibrillation: Not a warfarin candidate secondary to alcoholism and frequent falls as well as subdural hematoma. Continue metoprolol.  10. History pacemaker placement  11. Alcoholism: Recommend cessation  12. Frequent falls: Skilled nursing facility for rehabilitation.  13. History of right parotid surgery and inflammatory changes: Tympanic cavity and mastoid inflammatory changes again noted. Follow up with ENT as an outpatient as needed.  Consultants:  Neurology   Neurosurgery   Pulmonary critical care medicine   Physical therapy: Skilled nursing facility Rolling walker with 5 inch wheels.   Occupational therapy: Skilled nursing facility   Speech therapy: Regular diet. Then liquids.  Procedures:  2-D echocardiogram: Left ventricular ejection fraction 55-60%.   Bilateral carotid ultrasound: No significant internal carotid artery stenosis. Vertebral artery flow antegrade.   EEG: Normal awake and drowsy EEG.  Discharge Instructions  Discharge Orders    Future Appointments: Provider: Department: Dept  Phone: Center:   01/07/2012 11:15 AM [name], MD [facility] [phone] None     Future Orders Please Complete By Expires   Diet - low sodium heart healthy      Increase activity slowly  Discharge instructions      Comments:   No anticoagulants or antiplatelet agents until cleared by neurosurgeon.     Medication List  As of 10/09/2011  1:18 PM   STOP taking these medications         aspirin 81 MG tablet      clopidogrel 75 MG tablet      pioglitazone 15 MG tablet      ramipril 10 MG tablet      zolpidem 10 MG tablet         TAKE these medications         atorvastatin 40 MG tablet   Commonly known as: LIPITOR   Take 40 mg by mouth daily.      digoxin 0.25 MG tablet   Commonly known as: LANOXIN   Take 125 mcg by mouth daily.      metoprolol succinate 25 MG 24 hr tablet   Commonly known as: TOPROL-XL   Take 2 tablets (50 mg total) by mouth daily.      mulitivitamin with minerals Tabs   Take 1 tablet by mouth daily.      pantoprazole 40 MG tablet   Commonly known as: PROTONIX   Take 40 mg by mouth daily.      ramipril 1.25 MG capsule   Commonly known as: ALTACE   Take 1 capsule (1.25 mg total) by mouth daily.           Follow-up Information    Follow up with [name], MD in 2 weeks.   Contact information:   [address] [facility] [employer], [place]. [facility] [name] [address] [phone]       Follow up with Cristi Loron, MD. Schedule an appointment as soon as possible for a visit in 1 week.   Contact information:   [address], [address] [name] [address] [phone]       Follow up with [name], DAVID, MD. Schedule an appointment as soon as possible for a visit in 2 weeks.   Contact information:   [address], [address] [name] [address] [phone]          The results of significant diagnostics from this  hospitalization (including imaging, microbiology, ancillary and laboratory) are listed below for reference.    Significant Diagnostic Studies: Ct Angio Head W/cm &/or Wo Cm  10/06/2011  *RADIOLOGY REPORT*  Clinical Data:  78 year old male with altered mental status and multiple falls found have subdural hematoma.  CT ANGIOGRAPHY HEAD AND NECK  Technique:  Multidetector CT imaging of the head and neck was performed using the standard protocol during bolus administration of intravenous contrast.  Multiplanar CT image reconstructions including MIPs were obtained to evaluate the vascular anatomy. Carotid stenosis measurements (when applicable) are obtained utilizing NASCET criteria, using the distal internal carotid diameter as the denominator.  Contrast: 80mL OMNIPAQUE IOHEXOL 350 MG/ML SOLN  Comparison:  Head CTs 10/03/2011 and earlier.  CTA NECK  Findings:  Left chest transvenous cardiac pacemaker.  Right chest Port-A-Cath.  Bilateral layering pleural effusions are partially visible.  Lung apices otherwise are clear.  Negative thyroid, larynx, pharynx, parapharyngeal spaces, retropharyngeal space, sublingual space and visualized orbit soft tissues.  The right parotid gland may be surgically absent.  The right submandibular gland is fatty atrophied.  The left parotid and submandibular glands have a more normal appearance.  No cervical lymphadenopathy. Advanced degenerative changes throughout the cervical spine.  Mild paranasal sinus mucosal thickening.  Confluent opacity in the right tympanic cavity and mastoid air cells.  No soft tissue inflammatory changes about the mastoids.  Some mastoid septations may have been eroded near the semicircular canals.  Vascular Findings: Visualized major mediastinal vascular structures appear patent.  Three-vessel arch configuration with mild arch atherosclerosis.  Normal right common carotid artery origin.  Calcified plaque at the right carotid bifurcation.  Mild plaque in the  right ICA bulb.  No right ICA origin or bolt stenosis occurs.  The cervical right ICA otherwise is normal.  Mild calcified plaque at the right vertebral artery origin with no significant stenosis.  Normal cervical right vertebral artery otherwise.  Normal left common carotid artery origin.  Calcified plaque at the left carotid bifurcation.  Some involvement of the left ICA origin and bulb, but no left ICA stenosis occurs, and the downstream cervical left ICA is within normal limits.  Calcified plaque at the left vertebral artery origin resulting in mild stenosis.  Mild tortuosity of the distal left vertebral artery without stenosis.   Review of the MIP images confirms the above findings.  IMPRESSION: 1.  Mild for age neck atherosclerosis.  No significant arterial stenosis in the neck. 2.  Intracranial findings are below. 3.  Inflammatory changes in the right tympanic cavity and mastoids with only minimal improvement since 08/22/2011.  ENT follow-up may be valuable. 4.  Layering pleural effusions.  CTA HEAD  Findings:  Low density right extra-axial collection measures up to 7 mm in thickness and is stable since 08/22/2011.  Extend to the left subdural hematoma has hyperdense blood products posteriorly measuring up to 6 mm in thickness and a predominately low density component laterally measuring up to 9 mm in thickness.  There is a small volume of subdural hemorrhage along the interhemispheric fissure posteriorly.  3 mm of rightward midline shift has slightly increased since 10/02/2011.  A trace amount of subarachnoid hemorrhage may also be present on the left (series 3 image 24).  No intraventricular hemorrhage.  No ventriculomegaly.  Chronic left inferior frontal gyrus encephalomalacia.  Stable posterior fossa structures since 08/22/2011. No evidence of cortically based acute infarction identified.  No abnormal enhancement identified.  Stable visualized osseous structures.  Vascular Findings: Major intracranial  venous structures are enhancing.  Distal vertebral arteries are patent with only mild irregularity. Normal bilateral PICA vessels.  Normal vertebrobasilar junction. Mild irregularity and tapering of the distal basilar.  Normal superior cerebellar arteries and PCA origins.  Posterior communicating arteries are diminutive or absent.  Bilateral PCA branches are within normal limits.  Both ICA siphons are calcified.  Despite this, in stenosis of less than 50 % with respect to the distal vessel.  Ophthalmic artery origins are within normal limits.  Carotid termini are patent. Dominant right ACA A1 segment.  Anterior communicating artery is within normal limits.  A dominant median artery of the corpus callosum is present.  Otherwise ACA branches are within normal limits.  There is calcified plaque at the right MCA bifurcation. Otherwise the bilateral MCA branches are within normal limits.   Review of the MIP images confirms the above findings.  IMPRESSION: 1.  Mixed density left subdural hematoma with slight increased size and progression of rightward midline shift (3 mm) since 10/03/2011. Trace left subarachnoid hemorrhage. 2.  Low density right subdural hematoma/hygroma is stable since 08/22/2011. 3.  Negative intracranial CTA except for ICA siphon and right MCA bifurcation atherosclerosis.  Original Report Authenticated By: Ulla Potash  III, M.D.   Dg Chest Port 1 View  10/05/2011  *RADIOLOGY REPORT*  Clinical Data: Pulmonary edema  PORTABLE CHEST - 1 VIEW  Comparison: 10/04/2011  Findings: Cardiomediastinal silhouette is stable.  Central vascular congestion and without convincing pulmonary edema.  Dual lead cardiac pacemaker is unchanged in position.  Stable right Port-A- Cath position.  Hazy right basilar atelectasis or early infiltrate.  IMPRESSION: Central vascular congestion without convincing pulmonary edema. Stable right Port-A-Cath position.  Hazy right basilar atelectasis or early infiltrate.  Original Report  Authenticated By: Natasha Mead, M.D.   Microbiology: Recent Results (from the past 240 hour(s))  URINE CULTURE     Status: Normal   Collection Time   10/02/11  8:07 PM      Component Value Range Status Comment   Specimen Description URINE, RANDOM   Final    Special Requests NONE   Final    Culture  Setup Time [id number]   Final    Colony Count 20,OOO COLONIES/ML   Final    Culture     Final    Value: Multiple bacterial morphotypes present, none predominant. Suggest appropriate recollection if clinically indicated.   Report Status 10/04/2011 FINAL   Final   CULTURE, BLOOD (ROUTINE X 2)     Status: Normal   Collection Time   10/03/11 12:25 AM      Component Value Range Status Comment   Specimen Description BLOOD RIGHT ARM   Final    Special Requests BOTTLES DRAWN AEROBIC AND ANAEROBIC [facility]   Final    Culture  Setup Time [id number]   Final    Culture NO GROWTH 5 DAYS   Final    Report Status 10/09/2011 FINAL   Final   CULTURE, BLOOD (ROUTINE X 2)     Status: Normal   Collection Time   10/03/11 12:35 AM      Component Value Range Status Comment   Specimen Description BLOOD LEFT HAND   Final    Special Requests BOTTLES DRAWN AEROBIC AND ANAEROBIC [facility]   Final    Culture  Setup Time [id number]   Final    Culture NO GROWTH 5 DAYS   Final    Report Status 10/09/2011 FINAL   Final     Labs: Basic Metabolic Panel:  Lab 10/06/11 4401 10/06/11 0500 10/05/11 0452 10/02/11 2044  NA -- 138 137 135  K 3.9 3.3* -- --  CL -- 102 99 93*  CO2 -- 26 25 25   GLUCOSE -- 87 85 125*  BUN -- 18 18 13   CREATININE -- 0.79 0.99 0.74  CALCIUM -- 7.5* 7.5* 8.4  MG 1.3* 0.9* -- --  PHOS -- -- 4.7* --   Liver Function Tests:  Lab 10/06/11 0500 10/05/11 0452 10/02/11 2044  AST 247* 338* 48*  ALT 124* 104* 21  ALKPHOS 96 91 113  BILITOT 1.8* 1.8* 2.5*  PROT 5.3* 5.8* 6.8  ALBUMIN 2.6* 2.9* 3.6   CBC:  Lab 10/06/11 0500 10/05/11 0452 10/02/11 2044  WBC 7.8 8.7 5.5  NEUTROABS -- --  4.7  HGB 11.5* 11.8* 13.3  HCT 33.2* 33.7* 37.4*  MCV 103.4* 100.6* 100.8*  PLT 118* 106* 125*   Cardiac Enzymes:  Lab 10/03/11 0031 10/02/11 2044  CKTOTAL 469* --  CKMB 3.0 --  CKMBINDEX -- --  TROPONINI <0.30 <0.30   CBG:  Lab 10/09/11 1113 10/09/11 0649 10/08/11 2008 10/08/11 1710 10/08/11 1137  GLUCAP 132* 104* 108* 153* 124*  Time coordinating discharge: 25 minutes.  Signed:  Brendia Sacks, MD  [facility] 10/09/2011, 1:18 PM

## 2011-10-09 NOTE — Progress Notes (Signed)
[facility] PROGRESS NOTE  LEVANTE SIMONES AVW:[id number] DOB: Apr 14, 1930 DOA: 10/02/2011 PCP: [name], MD, MD Oncologist: Dr. Arline Asp  Cardiologist: Dr. Susa Griffins.  Assessment/Plan: 1. Subdural hematoma: No further inpatient evaluation per neurosurgery. Followup with neurosurgery as an outpatient with serial head CT scans. Aspirin and Plavix discontinued. History of closed head injury approximately 2 weeks prior to admission with laceration repair. 2. Severe expressive aphasia/right visual field defect: Resolved. Suspected seizure versus acute stroke left MCA per neurology. Long-term treatment with aspirin and Plavix, currently on hold because his subdural hematoma. Resume once cleared by neurosurgery. 3. Delirium tremens: Resolved. Treated with Precedex.  4. Possible UTI: Urine culture unrevealing. Empiric Rocephin discontinued. 5. Hypomagnesemia: Repleted. 6. Transaminitis: Most likely secondary to alcoholic hepatitis. Followup as outpatient. 7. Thrombocytopenia: Stable. Probably secondary to alcohol. Follow up as an outpatient. 8. Diabetes mellitus type 2: Stable. Continue sliding scale insulin. Hemoglobin A1c 5.6. 9. History of atrial fibrillation: Not a warfarin candidate secondary to alcoholism and frequent falls as well as subdural hematoma. Continue metoprolol. 10. History pacemaker placement 11. Alcoholism: Recommend cessation 12. Frequent falls: Skilled nursing facility for rehabilitation. 13. History of right parotid surgery and inflammatory changes: Tympanic cavity and mastoid inflammatory changes again noted. Follow up with ENT as an outpatient as needed.  Code Status: Full code Family Communication: None at bedside. Disposition Plan: Skilled nursing facility when bed available. Medically stable for discharge.  Brendia Sacks, MD  [facility] Pager [phone] 8AM-8PM  If 8PM-8AM, please contact  floor/night-coverage [email or web address] Password [email or web address] 10/09/2011, 12:54 PM  LOS: 7 days   Brief narrative: 78 year old man presented to the emergency department with confusion and garbled speech. CT of the head revealed a small subdural hematoma and neurosurgery and neurology consultations were obtained. Subsequent CT demonstrates stability of the subdural hematoma and neurosurgery signed off. No intervention was worth it except for the discontinuation of aspirin and Plavix. This patient per neurology suspected to have had a stroke. He developed alcohol withdrawal approximately 48 hours into admission and was transferred briefly to the critical care service and placed on a Precedex.  Past medical history: Alcoholism, frequent falls, transitional cell carcinoma of the bladder, carcinoma right parotid gland, pacemaker implantation 4 history of intermittent complete heart block, TIA, spontaneous subdural hematoma, paroxysmal atrial fibrillation unable to tolerate Betapace or amiodarone, diabetes mellitus  Consultants:  Neurology  Neurosurgery  Pulmonary critical care medicine  Physical therapy: Skilled nursing facility Rolling walker with 5 inch wheels.  Occupational therapy: Skilled nursing facility  Speech therapy: Regular diet. And liquids.  Procedures:  2-D echocardiogram: Left ventricular ejection fraction 55-60%.  Bilateral carotid ultrasound: No significant internal carotid artery stenosis. Vertebral artery flow antegrade.  EEG: Normal awake and drowsy EEG.  Interim History: Interval documentation reviewed.  Subjective: No complaints.  Objective: Filed Vitals:   10/08/11 0420 10/08/11 1415 10/08/11 2011 10/09/11 0619  BP: 139/83 134/75 146/86 171/94  Pulse: 69 69 69 69  Temp: 99 F (37.2 C)  99 F (37.2 C) 98.5 F (36.9 C)  TempSrc: Oral  Oral Oral  Resp: 18 18 18 18   Height:      Weight:      SpO2: 96% 98% 98% 97%    Intake/Output Summary (Last 24 hours) at  10/09/11 1254 Last data filed at 10/09/11 0600  Gross per 24 hour  Intake    660 ml  Output   2950 ml  Net  -2290 ml    Exam:   General:  Calm and comfortable.  Cardiovascular: Regular rate and rhythm. No murmur, rub, gallop.   Telemetry: Paced rhythm.  Respiratory: Clear to auscultation bilaterally. No wheezes, rales, rhonchi. Normal respiratory effort.  Psychiatric: Grossly normal mood and affect.  Data Reviewed: Basic Metabolic Panel:  Lab 10/06/11 1191 10/06/11 0500 10/05/11 0452 10/02/11 2044  NA -- 138 137 135  K 3.9 3.3* -- --  CL -- 102 99 93*  CO2 -- 26 25 25   GLUCOSE -- 87 85 125*  BUN -- 18 18 13   CREATININE -- 0.79 0.99 0.74  CALCIUM -- 7.5* 7.5* 8.4  MG 1.3* 0.9* -- --  PHOS -- -- 4.7* --   Liver Function Tests:  Lab 10/06/11 0500 10/05/11 0452 10/02/11 2044  AST 247* 338* 48*  ALT 124* 104* 21  ALKPHOS 96 91 113  BILITOT 1.8* 1.8* 2.5*  PROT 5.3* 5.8* 6.8  ALBUMIN 2.6* 2.9* 3.6   CBC:  Lab 10/06/11 0500 10/05/11 0452 10/02/11 2044  WBC 7.8 8.7 5.5  NEUTROABS -- -- 4.7  HGB 11.5* 11.8* 13.3  HCT 33.2* 33.7* 37.4*  MCV 103.4* 100.6* 100.8*  PLT 118* 106* 125*   Cardiac Enzymes:  Lab 10/03/11 0031 10/02/11 2044  CKTOTAL 469* --  CKMB 3.0 --  CKMBINDEX -- --  TROPONINI <0.30 <0.30   CBG:  Lab 10/09/11 1113 10/09/11 0649 10/08/11 2008 10/08/11 1710 10/08/11 1137  GLUCAP 132* 104* 108* 153* 124*    Recent Results (from the past 240 hour(s))  URINE CULTURE     Status: Normal   Collection Time   10/02/11  8:07 PM      Component Value Range Status Comment   Specimen Description URINE, RANDOM   Final    Special Requests NONE   Final    Culture  Setup Time [id number]   Final    Colony Count 20,OOO COLONIES/ML   Final    Culture     Final    Value: Multiple bacterial morphotypes present, none predominant. Suggest appropriate recollection if clinically indicated.   Report Status 10/04/2011 FINAL   Final   CULTURE, BLOOD (ROUTINE X 2)      Status: Normal   Collection Time   10/03/11 12:25 AM      Component Value Range Status Comment   Specimen Description BLOOD RIGHT ARM   Final    Special Requests BOTTLES DRAWN AEROBIC AND ANAEROBIC [facility]   Final    Culture  Setup Time [id number]   Final    Culture NO GROWTH 5 DAYS   Final    Report Status 10/09/2011 FINAL   Final   CULTURE, BLOOD (ROUTINE X 2)     Status: Normal   Collection Time   10/03/11 12:35 AM      Component Value Range Status Comment   Specimen Description BLOOD LEFT HAND   Final    Special Requests BOTTLES DRAWN AEROBIC AND ANAEROBIC [facility]   Final    Culture  Setup Time [id number]   Final    Culture NO GROWTH 5 DAYS   Final    Report Status 10/09/2011 FINAL   Final      Studies: Ct Angio Head W/cm &/or Wo Cm  10/06/2011  *RADIOLOGY REPORT*  Clinical Data:  78 year old male with altered mental status and multiple falls found have subdural hematoma.  CT ANGIOGRAPHY HEAD AND NECK   IMPRESSION: 1.  Mild for age neck atherosclerosis.  No significant arterial stenosis in the neck. 2.  Intracranial findings  are below. 3.  Inflammatory changes in the right tympanic cavity and mastoids with only minimal improvement since 08/22/2011.  ENT follow-up may be valuable. 4.  Layering pleural effusions.   CTA HEAD  IMPRESSION: 1.  Mixed density left subdural hematoma with slight increased size and progression of rightward midline shift (3 mm) since 10/03/2011. Trace left subarachnoid hemorrhage. 2.  Low density right subdural hematoma/hygroma is stable since 08/22/2011. 3.  Negative intracranial CTA except for ICA siphon and right MCA bifurcation atherosclerosis.  Original Report Authenticated By: Harley Hallmark, M.D.   Scheduled Meds:    . antiseptic oral rinse  15 mL Mouth Rinse BID  . atorvastatin  40 mg Oral Daily  . digoxin  125 mcg Oral Daily  . insulin aspart  0-9 Units Subcutaneous TID WC  . metoprolol succinate  50 mg Oral Daily  . mulitivitamin with  minerals  1 tablet Oral Daily  . pantoprazole  40 mg Oral Daily  . ramipril  1.25 mg Oral Daily  . thiamine  100 mg Oral Daily   Continuous Infusions:    . sodium chloride 20 mL/hr (10/08/11 1117)

## 2011-10-15 NOTE — ED Provider Notes (Signed)
I saw and evaluated the patient, reviewed the resident's note and I agree with the findings and plan.   .Face to face Exam:   HEENT:  Atraumatic Resp:  Normal effort Abd:  Nondistended Neuro:No focal weakness Lymph: No adenopathy   Nelia Shi, MD 10/15/11 [phone]

## 2011-11-04 ENCOUNTER — Other Ambulatory Visit: Payer: Self-pay | Admitting: Neurosurgery

## 2011-11-04 DIAGNOSIS — M5410 Radiculopathy, site unspecified: Secondary | ICD-10-CM

## 2011-11-04 DIAGNOSIS — M542 Cervicalgia: Secondary | ICD-10-CM

## 2011-11-04 DIAGNOSIS — S065X9A Traumatic subdural hemorrhage with loss of consciousness of unspecified duration, initial encounter: Secondary | ICD-10-CM

## 2011-11-04 DIAGNOSIS — M549 Dorsalgia, unspecified: Secondary | ICD-10-CM

## 2011-11-09 ENCOUNTER — Other Ambulatory Visit: Payer: Medicare Other

## 2011-11-10 ENCOUNTER — Ambulatory Visit
Admission: RE | Admit: 2011-11-10 | Discharge: 2011-11-10 | Disposition: A | Discharge status: Home / Self Care | Payer: Medicare Other | Source: Ambulatory Visit | Attending: Neurosurgery | Admitting: Neurosurgery

## 2011-11-10 VITALS — BP 137/60 | HR 68

## 2011-11-10 DIAGNOSIS — M542 Cervicalgia: Secondary | ICD-10-CM

## 2011-11-10 DIAGNOSIS — S065XAA Traumatic subdural hemorrhage with loss of consciousness status unknown, initial encounter: Secondary | ICD-10-CM

## 2011-11-10 DIAGNOSIS — M5410 Radiculopathy, site unspecified: Secondary | ICD-10-CM

## 2011-11-10 DIAGNOSIS — M549 Dorsalgia, unspecified: Secondary | ICD-10-CM

## 2011-11-10 DIAGNOSIS — S065X9A Traumatic subdural hemorrhage with loss of consciousness of unspecified duration, initial encounter: Secondary | ICD-10-CM

## 2011-11-10 MED ORDER — DIAZEPAM 5 MG PO TABS
5.0000 mg | ORAL_TABLET | Freq: Once | ORAL | Status: AC
  Administered 2011-11-10: 5 mg via ORAL

## 2011-11-10 NOTE — Discharge Instructions (Signed)

## 2011-11-10 NOTE — Progress Notes (Signed)
CT myelo cancelled due to results of the pt's CT of the head. Dr. Deanne Coffer in to speak to pt and daughter about why exam was cancelled. Pt will return to [facility] for further evaluation.D [email or web address]

## 2011-11-19 ENCOUNTER — Other Ambulatory Visit: Payer: Self-pay | Admitting: Neurosurgery

## 2011-11-19 ENCOUNTER — Ambulatory Visit
Admission: RE | Admit: 2011-11-19 | Discharge: 2011-11-19 | Disposition: A | Discharge status: Home / Self Care | Payer: Medicare Other | Source: Ambulatory Visit | Attending: Neurosurgery | Admitting: Neurosurgery

## 2011-11-19 DIAGNOSIS — S065X9A Traumatic subdural hemorrhage with loss of consciousness of unspecified duration, initial encounter: Secondary | ICD-10-CM

## 2011-11-19 DIAGNOSIS — S065XAA Traumatic subdural hemorrhage with loss of consciousness status unknown, initial encounter: Secondary | ICD-10-CM

## 2011-12-02 ENCOUNTER — Other Ambulatory Visit: Payer: Self-pay | Admitting: Neurosurgery

## 2011-12-02 DIAGNOSIS — I6200 Nontraumatic subdural hemorrhage, unspecified: Secondary | ICD-10-CM

## 2011-12-07 ENCOUNTER — Ambulatory Visit
Admission: RE | Admit: 2011-12-07 | Discharge: 2011-12-07 | Disposition: A | Discharge status: Home / Self Care | Payer: Medicare Other | Source: Ambulatory Visit | Attending: Neurosurgery | Admitting: Neurosurgery

## 2011-12-07 DIAGNOSIS — I6200 Nontraumatic subdural hemorrhage, unspecified: Secondary | ICD-10-CM

## 2011-12-09 ENCOUNTER — Other Ambulatory Visit: Payer: Self-pay | Admitting: Neurosurgery

## 2011-12-11 ENCOUNTER — Encounter (HOSPITAL_COMMUNITY)
Admission: RE | Admit: 2011-12-11 | Discharge: 2011-12-11 | Disposition: A | Discharge status: Home / Self Care | Payer: Medicare Other | Source: Ambulatory Visit | Attending: Neurosurgery | Admitting: Neurosurgery

## 2011-12-11 ENCOUNTER — Encounter (HOSPITAL_COMMUNITY): Payer: Self-pay

## 2011-12-11 HISTORY — DX: Presence of cardiac pacemaker: Z95.0

## 2011-12-11 HISTORY — DX: Unspecified hearing loss, unspecified ear: H91.90

## 2011-12-11 HISTORY — DX: Cerebral infarction, unspecified: I63.9

## 2011-12-11 LAB — SURGICAL PCR SCREEN
MRSA, PCR: NEGATIVE
Staphylococcus aureus: POSITIVE — AB

## 2011-12-11 LAB — TYPE AND SCREEN: Antibody Screen: NEGATIVE

## 2011-12-11 LAB — CBC
Hemoglobin: 12.8 g/dL — ABNORMAL LOW (ref 13.0–17.0)
MCH: 33 pg (ref 26.0–34.0)
Platelets: 178 10*3/uL (ref 150–400)
RBC: 3.88 MIL/uL — ABNORMAL LOW (ref 4.22–5.81)

## 2011-12-11 LAB — BASIC METABOLIC PANEL
Calcium: 9.2 mg/dL (ref 8.4–10.5)
GFR calc Af Amer: >90 mL/min (ref >90)
GFR calc non Af Amer: 85 mL/min — ABNORMAL LOW (ref >90)
Glucose, Bld: 101 mg/dL — ABNORMAL HIGH (ref 70–99)
Potassium: 3.6 mEq/L (ref 3.5–5.1)
Sodium: 142 mEq/L (ref 135–145)

## 2011-12-11 NOTE — Pre-Procedure Instructions (Signed)
[name]  12/11/2011   Your procedure is scheduled on:  Monday, June 24th.  Report to [facility] at AM.  Call this number if you have problems the morning of surgery: [phone]   Remember:   Do not eat food or drink any liquid:After Midnight.      Take these medicines the morning of surgery with A SIP OF WATER: Digoxin (Lanoxin),  Metoprolol (Toprol -XL), Pantoprazole (Protonix). Do not take any Coumadin, Aspirin, NSAIDS, Plavix ,Effient or herbal medications.  Do not wear jewelry, make-up or nail polish.  Do not wear lotions, powders, or perfumes. You may wear deodorant.  Do not shave 48 hours prior to surgery. Men may shave face and neck.  Do not bring valuables to the hospital.  Contacts, dentures or bridgework may not be worn into surgery.  Leave suitcase in the car. After surgery it may be brought to your room.  For patients admitted to the hospital, checkout time is 11:00 AM the day of discharge.   Patients discharged the day of surgery will not be allowed to drive home.  Name and phone number of your driver: NA  Special Instructions: CHG Shower Use Special Wash: 1/2 bottle night before surgery and 1/2 bottle morning of surgery.   Please read over the following fact sheets that you were given: Pain Booklet, Coughing and Deep Breathing, Blood Transfusion Information, MRSA Information and Surgical Site Infection Prevention

## 2011-12-13 MED ORDER — CEFAZOLIN SODIUM 1-5 GM-% IV SOLN
1.0000 g | INTRAVENOUS | Status: AC
  Administered 2011-12-14 (×2): 1 g via INTRAVENOUS
  Filled 2011-12-13: qty 50

## 2011-12-14 ENCOUNTER — Encounter (HOSPITAL_COMMUNITY): Payer: Self-pay | Admitting: *Deleted

## 2011-12-14 ENCOUNTER — Ambulatory Visit (HOSPITAL_COMMUNITY): Payer: Medicare Other | Admitting: Anesthesiology

## 2011-12-14 ENCOUNTER — Encounter (HOSPITAL_COMMUNITY): Payer: Self-pay | Admitting: Anesthesiology

## 2011-12-14 ENCOUNTER — Inpatient Hospital Stay (HOSPITAL_COMMUNITY)
Admission: RE | Admit: 2011-12-14 | Discharge: 2011-12-22 | DRG: 026 | Disposition: A | Discharge status: Home / Self Care | Payer: Medicare Other | Source: Ambulatory Visit | Attending: Neurosurgery | Admitting: Neurosurgery

## 2011-12-14 ENCOUNTER — Inpatient Hospital Stay (HOSPITAL_COMMUNITY): Payer: Medicare Other

## 2011-12-14 ENCOUNTER — Ambulatory Visit (HOSPITAL_COMMUNITY): Payer: Medicare Other

## 2011-12-14 ENCOUNTER — Encounter (HOSPITAL_COMMUNITY)
Admission: RE | Disposition: A | Discharge status: Home / Self Care | Payer: Self-pay | Source: Ambulatory Visit | Attending: Neurosurgery

## 2011-12-14 DIAGNOSIS — F1010 Alcohol abuse, uncomplicated: Secondary | ICD-10-CM | POA: Diagnosis present

## 2011-12-14 DIAGNOSIS — Z79899 Other long term (current) drug therapy: Secondary | ICD-10-CM

## 2011-12-14 DIAGNOSIS — Z950 Presence of cardiac pacemaker: Secondary | ICD-10-CM

## 2011-12-14 DIAGNOSIS — H9190 Unspecified hearing loss, unspecified ear: Secondary | ICD-10-CM | POA: Diagnosis present

## 2011-12-14 DIAGNOSIS — Z8673 Personal history of transient ischemic attack (TIA), and cerebral infarction without residual deficits: Secondary | ICD-10-CM

## 2011-12-14 DIAGNOSIS — I6200 Nontraumatic subdural hemorrhage, unspecified: Principal | ICD-10-CM | POA: Diagnosis present

## 2011-12-14 DIAGNOSIS — Z923 Personal history of irradiation: Secondary | ICD-10-CM

## 2011-12-14 DIAGNOSIS — E871 Hypo-osmolality and hyponatremia: Secondary | ICD-10-CM | POA: Diagnosis not present

## 2011-12-14 DIAGNOSIS — Z9221 Personal history of antineoplastic chemotherapy: Secondary | ICD-10-CM

## 2011-12-14 DIAGNOSIS — K5900 Constipation, unspecified: Secondary | ICD-10-CM | POA: Diagnosis not present

## 2011-12-14 DIAGNOSIS — E785 Hyperlipidemia, unspecified: Secondary | ICD-10-CM | POA: Diagnosis present

## 2011-12-14 DIAGNOSIS — Z8249 Family history of ischemic heart disease and other diseases of the circulatory system: Secondary | ICD-10-CM

## 2011-12-14 DIAGNOSIS — E876 Hypokalemia: Secondary | ICD-10-CM | POA: Diagnosis not present

## 2011-12-14 DIAGNOSIS — I10 Essential (primary) hypertension: Secondary | ICD-10-CM | POA: Diagnosis present

## 2011-12-14 DIAGNOSIS — I4891 Unspecified atrial fibrillation: Secondary | ICD-10-CM | POA: Diagnosis present

## 2011-12-14 DIAGNOSIS — E119 Type 2 diabetes mellitus without complications: Secondary | ICD-10-CM | POA: Diagnosis present

## 2011-12-14 DIAGNOSIS — M1710 Unilateral primary osteoarthritis, unspecified knee: Secondary | ICD-10-CM | POA: Diagnosis present

## 2011-12-14 HISTORY — PX: CRANIOTOMY: SHX93

## 2011-12-14 LAB — GLUCOSE, CAPILLARY

## 2011-12-14 SURGERY — CRANIOTOMY HEMATOMA EVACUATION SUBDURAL
Anesthesia: General | Laterality: Left | Wound class: Clean

## 2011-12-14 MED ORDER — BACITRACIN ZINC 500 UNIT/GM EX OINT
TOPICAL_OINTMENT | CUTANEOUS | Status: DC | PRN
  Administered 2011-12-14: 1 via TOPICAL

## 2011-12-14 MED ORDER — ONDANSETRON HCL 4 MG PO TABS: 4.0000 mg | ORAL_TABLET | ORAL | Status: DC | PRN

## 2011-12-14 MED ORDER — GLYCOPYRROLATE 0.2 MG/ML IJ SOLN
INTRAMUSCULAR | Status: DC | PRN
  Administered 2011-12-14: 0.4 mg via INTRAVENOUS

## 2011-12-14 MED ORDER — ZOLPIDEM TARTRATE 5 MG PO TABS
5.0000 mg | ORAL_TABLET | Freq: Every evening | ORAL | Status: DC | PRN
  Administered 2011-12-14 – 2011-12-17 (×4): 5 mg via ORAL
  Filled 2011-12-14 (×4): qty 1

## 2011-12-14 MED ORDER — SODIUM CHLORIDE 0.9 % IV SOLN
INTRAVENOUS | Status: DC | PRN
  Administered 2011-12-14: 12:00:00 via INTRAVENOUS

## 2011-12-14 MED ORDER — PROMETHAZINE HCL 12.5 MG PO TABS
12.5000 mg | ORAL_TABLET | ORAL | Status: DC | PRN
  Filled 2011-12-14: qty 2

## 2011-12-14 MED ORDER — FENTANYL CITRATE 0.05 MG/ML IJ SOLN
INTRAMUSCULAR | Status: DC | PRN
  Administered 2011-12-14: 50 ug via INTRAVENOUS
  Administered 2011-12-14: 150 ug via INTRAVENOUS
  Administered 2011-12-14: 50 ug via INTRAVENOUS

## 2011-12-14 MED ORDER — MIDAZOLAM HCL 5 MG/5ML IJ SOLN
INTRAMUSCULAR | Status: DC | PRN
  Administered 2011-12-14: 1 mg via INTRAVENOUS

## 2011-12-14 MED ORDER — 0.9 % SODIUM CHLORIDE (POUR BTL) OPTIME
TOPICAL | Status: DC | PRN
  Administered 2011-12-14: 1000 mL

## 2011-12-14 MED ORDER — SURGIFOAM 100 EX MISC
CUTANEOUS | Status: DC | PRN
  Administered 2011-12-14: 10:00:00 via TOPICAL

## 2011-12-14 MED ORDER — ACETAMINOPHEN 10 MG/ML IV SOLN
INTRAVENOUS | Status: AC
  Administered 2011-12-14: 1000 mg via INTRAVENOUS
  Filled 2011-12-14: qty 100

## 2011-12-14 MED ORDER — RAMIPRIL 10 MG PO CAPS
10.0000 mg | ORAL_CAPSULE | Freq: Every day | ORAL | Status: DC
  Administered 2011-12-14 – 2011-12-22 (×7): 10 mg via ORAL
  Filled 2011-12-14 (×9): qty 1

## 2011-12-14 MED ORDER — LIDOCAINE HCL (CARDIAC) 20 MG/ML IV SOLN
INTRAVENOUS | Status: DC | PRN
  Administered 2011-12-14: 80 mg via INTRAVENOUS

## 2011-12-14 MED ORDER — LEVETIRACETAM 500 MG/5ML IV SOLN
500.0000 mg | Freq: Two times a day (BID) | INTRAVENOUS | Status: DC
  Administered 2011-12-14 (×2): 500 mg via INTRAVENOUS
  Filled 2011-12-14 (×4): qty 5

## 2011-12-14 MED ORDER — BUPIVACAINE-EPINEPHRINE 0.5% -1:200000 IJ SOLN
INTRAMUSCULAR | Status: DC | PRN
  Administered 2011-12-14: 10 mL

## 2011-12-14 MED ORDER — CEFAZOLIN SODIUM 1-5 GM-% IV SOLN
INTRAVENOUS | Status: AC
  Filled 2011-12-14: qty 50

## 2011-12-14 MED ORDER — ROCURONIUM BROMIDE 100 MG/10ML IV SOLN
INTRAVENOUS | Status: DC | PRN
  Administered 2011-12-14: 50 mg via INTRAVENOUS

## 2011-12-14 MED ORDER — DEXTROSE 5 % IV SOLN
INTRAVENOUS | Status: DC | PRN
  Administered 2011-12-14 (×3): via INTRAVENOUS

## 2011-12-14 MED ORDER — ADULT MULTIVITAMIN W/MINERALS CH
1.0000 | ORAL_TABLET | Freq: Every day | ORAL | Status: DC
  Administered 2011-12-14 – 2011-12-22 (×7): 1 via ORAL
  Filled 2011-12-14 (×9): qty 1

## 2011-12-14 MED ORDER — ATORVASTATIN CALCIUM 40 MG PO TABS
40.0000 mg | ORAL_TABLET | Freq: Every day | ORAL | Status: DC
  Administered 2011-12-14 – 2011-12-21 (×7): 40 mg via ORAL
  Filled 2011-12-14 (×9): qty 1

## 2011-12-14 MED ORDER — METOPROLOL SUCCINATE ER 50 MG PO TB24: 50.0000 mg | ORAL_TABLET | Freq: Every day | ORAL | Status: DC

## 2011-12-14 MED ORDER — ONDANSETRON HCL 4 MG/2ML IJ SOLN: 4.0000 mg | INTRAMUSCULAR | Status: DC | PRN

## 2011-12-14 MED ORDER — ACETAMINOPHEN 650 MG RE SUPP: 650.0000 mg | RECTAL | Status: DC | PRN

## 2011-12-14 MED ORDER — FOLIC ACID 1 MG PO TABS
1.0000 mg | ORAL_TABLET | Freq: Every day | ORAL | Status: DC
  Administered 2011-12-14 – 2011-12-17 (×4): 1 mg via ORAL
  Filled 2011-12-14 (×5): qty 1

## 2011-12-14 MED ORDER — ONDANSETRON HCL 4 MG/2ML IJ SOLN
INTRAMUSCULAR | Status: DC | PRN
  Administered 2011-12-14: 4 mg via INTRAVENOUS

## 2011-12-14 MED ORDER — HYDROCODONE-ACETAMINOPHEN 5-325 MG PO TABS
1.0000 | ORAL_TABLET | ORAL | Status: DC | PRN
  Administered 2011-12-14 – 2011-12-15 (×4): 1 via ORAL
  Filled 2011-12-14 (×4): qty 1

## 2011-12-14 MED ORDER — PROPOFOL 10 MG/ML IV EMUL
INTRAVENOUS | Status: DC | PRN
  Administered 2011-12-14: 100 mg via INTRAVENOUS
  Administered 2011-12-14: 35 mg via INTRAVENOUS
  Administered 2011-12-14: 165 mg via INTRAVENOUS

## 2011-12-14 MED ORDER — OXYCODONE-ACETAMINOPHEN 5-325 MG PO TABS
1.0000 | ORAL_TABLET | ORAL | Status: DC | PRN
  Administered 2011-12-14 – 2011-12-18 (×7): 1 via ORAL
  Filled 2011-12-14 (×9): qty 1

## 2011-12-14 MED ORDER — METOPROLOL SUCCINATE ER 50 MG PO TB24
50.0000 mg | ORAL_TABLET | ORAL | Status: DC
  Filled 2011-12-14: qty 1

## 2011-12-14 MED ORDER — THIAMINE HCL 100 MG/ML IJ SOLN
100.0000 mg | Freq: Every day | INTRAMUSCULAR | Status: DC
  Filled 2011-12-14 (×4): qty 1

## 2011-12-14 MED ORDER — FOLIC ACID 400 MCG PO TABS: 400.0000 ug | ORAL_TABLET | Freq: Every day | ORAL | Status: DC

## 2011-12-14 MED ORDER — DIGOXIN 125 MCG PO TABS
125.0000 ug | ORAL_TABLET | Freq: Every day | ORAL | Status: DC
  Administered 2011-12-14 – 2011-12-17 (×4): 125 ug via ORAL
  Filled 2011-12-14 (×5): qty 1

## 2011-12-14 MED ORDER — PANTOPRAZOLE SODIUM 40 MG PO TBEC
40.0000 mg | DELAYED_RELEASE_TABLET | Freq: Every day | ORAL | Status: DC
  Administered 2011-12-14 – 2011-12-17 (×4): 40 mg via ORAL
  Filled 2011-12-14 (×4): qty 1

## 2011-12-14 MED ORDER — HEMOSTATIC AGENTS (NO CHARGE) OPTIME
TOPICAL | Status: DC | PRN
  Administered 2011-12-14: 1 via TOPICAL

## 2011-12-14 MED ORDER — LORAZEPAM 1 MG PO TABS
1.0000 mg | ORAL_TABLET | Freq: Four times a day (QID) | ORAL | Status: AC | PRN
  Administered 2011-12-15 – 2011-12-17 (×3): 1 mg via ORAL
  Filled 2011-12-14 (×5): qty 1

## 2011-12-14 MED ORDER — ADULT MULTIVITAMIN W/MINERALS CH: 1.0000 | ORAL_TABLET | Freq: Every day | ORAL | Status: DC

## 2011-12-14 MED ORDER — ACETAMINOPHEN 325 MG PO TABS
650.0000 mg | ORAL_TABLET | ORAL | Status: DC | PRN
  Administered 2011-12-19 – 2011-12-21 (×4): 650 mg via ORAL
  Filled 2011-12-14 (×4): qty 2

## 2011-12-14 MED ORDER — LACTATED RINGERS IV SOLN
INTRAVENOUS | Status: DC
  Administered 2011-12-14 – 2011-12-15 (×2): via INTRAVENOUS

## 2011-12-14 MED ORDER — BACITRACIN 50000 UNITS IM SOLR
INTRAMUSCULAR | Status: AC
  Filled 2011-12-14: qty 1

## 2011-12-14 MED ORDER — LABETALOL HCL 5 MG/ML IV SOLN
10.0000 mg | INTRAVENOUS | Status: DC | PRN
  Administered 2011-12-14: 40 mg via INTRAVENOUS
  Administered 2011-12-18: 20 mg via INTRAVENOUS
  Filled 2011-12-14 (×2): qty 4

## 2011-12-14 MED ORDER — NEOSTIGMINE METHYLSULFATE 1 MG/ML IJ SOLN
INTRAMUSCULAR | Status: DC | PRN
  Administered 2011-12-14: 3 mg via INTRAVENOUS

## 2011-12-14 MED ORDER — PHENYLEPHRINE HCL 10 MG/ML IJ SOLN
20.0000 mg | INTRAVENOUS | Status: DC | PRN
  Administered 2011-12-14: 25 ug/min via INTRAVENOUS

## 2011-12-14 MED ORDER — LIDOCAINE HCL 4 % MT SOLN
OROMUCOSAL | Status: DC | PRN
  Administered 2011-12-14: 4 mL via TOPICAL

## 2011-12-14 MED ORDER — LACTATED RINGERS IV SOLN
INTRAVENOUS | Status: DC | PRN
  Administered 2011-12-14: 10:00:00 via INTRAVENOUS

## 2011-12-14 MED ORDER — DOCUSATE SODIUM 100 MG PO CAPS
100.0000 mg | ORAL_CAPSULE | Freq: Two times a day (BID) | ORAL | Status: DC
  Administered 2011-12-14 – 2011-12-22 (×15): 100 mg via ORAL
  Filled 2011-12-14 (×14): qty 1

## 2011-12-14 MED ORDER — METOPROLOL SUCCINATE ER 25 MG PO TB24
25.0000 mg | ORAL_TABLET | Freq: Every day | ORAL | Status: DC
  Administered 2011-12-15 – 2011-12-17 (×3): 25 mg via ORAL
  Filled 2011-12-14 (×4): qty 1

## 2011-12-14 MED ORDER — SODIUM CHLORIDE 0.9 % IV SOLN
INTRAVENOUS | Status: AC
  Filled 2011-12-14: qty 500

## 2011-12-14 MED ORDER — METOPROLOL SUCCINATE ER 25 MG PO TB24
25.0000 mg | ORAL_TABLET | Freq: Every day | ORAL | Status: DC
  Administered 2011-12-14: 25 mg via ORAL
  Filled 2011-12-14 (×2): qty 1

## 2011-12-14 MED ORDER — VITAMIN B-1 100 MG PO TABS
100.0000 mg | ORAL_TABLET | Freq: Every day | ORAL | Status: DC
  Administered 2011-12-14 – 2011-12-17 (×4): 100 mg via ORAL
  Filled 2011-12-14 (×5): qty 1

## 2011-12-14 MED ORDER — PANTOPRAZOLE SODIUM 40 MG IV SOLR: 40.0000 mg | Freq: Every day | INTRAVENOUS | Status: DC

## 2011-12-14 MED ORDER — LORAZEPAM 2 MG/ML IJ SOLN
1.0000 mg | Freq: Four times a day (QID) | INTRAMUSCULAR | Status: AC | PRN
  Filled 2011-12-14: qty 1

## 2011-12-14 MED ORDER — CEFAZOLIN SODIUM-DEXTROSE 2-3 GM-% IV SOLR
2.0000 g | Freq: Three times a day (TID) | INTRAVENOUS | Status: AC
  Administered 2011-12-14 – 2011-12-15 (×2): 2 g via INTRAVENOUS
  Filled 2011-12-14 (×2): qty 50

## 2011-12-14 SURGICAL SUPPLY — 70 items
BAG DECANTER FOR FLEXI CONT (MISCELLANEOUS) ×1 IMPLANT
BIT DRILL WIRE PASS 1.3MM (BIT) IMPLANT
BLADE CLIPPER SURG NEURO (BLADE) ×2 IMPLANT
BRUSH SCRUB EZ 1% IODOPHOR (MISCELLANEOUS) ×2 IMPLANT
BRUSH SCRUB EZ PLAIN DRY (MISCELLANEOUS) ×2 IMPLANT
BUR ACORN 6.0 PRECISION (BURR) ×2 IMPLANT
BUR ROUTER D-58 CRANI (BURR) ×1 IMPLANT
CANISTER SUCTION 2500CC (MISCELLANEOUS) ×2 IMPLANT
CLIP TI MEDIUM 6 (CLIP) IMPLANT
CLOTH BEACON ORANGE TIMEOUT ST (SAFETY) ×2 IMPLANT
CONT SPEC 4OZ CLIKSEAL STRL BL (MISCELLANEOUS) ×2 IMPLANT
CORDS BIPOLAR (ELECTRODE) ×2 IMPLANT
COVER TABLE BACK 60X90 (DRAPES) IMPLANT
DRAIN JACKSON PRATT 10MM FLAT (MISCELLANEOUS) ×1 IMPLANT
DRAIN SNY WOU 7FLT (WOUND CARE) IMPLANT
DRAPE NEUROLOGICAL W/INCISE (DRAPES) ×2 IMPLANT
DRAPE SURG 17X23 STRL (DRAPES) IMPLANT
DRAPE WARM FLUID 44X44 (DRAPE) ×2 IMPLANT
DRESSING TELFA 8X3 (GAUZE/BANDAGES/DRESSINGS) ×2 IMPLANT
DRILL WIRE PASS 1.3MM (BIT)
ELECT CAUTERY BLADE 6.4 (BLADE) ×2 IMPLANT
ELECT REM PT RETURN 9FT ADLT (ELECTROSURGICAL) ×2
ELECTRODE REM PT RTRN 9FT ADLT (ELECTROSURGICAL) ×1 IMPLANT
EVACUATOR 1/8 PVC DRAIN (DRAIN) ×1 IMPLANT
EVACUATOR SILICONE 100CC (DRAIN) IMPLANT
GAUZE SPONGE 4X4 16PLY XRAY LF (GAUZE/BANDAGES/DRESSINGS) IMPLANT
GLOVE BIO SURGEON STRL SZ8.5 (GLOVE) ×2 IMPLANT
GLOVE BIOGEL PI IND STRL 6.5 (GLOVE) IMPLANT
GLOVE BIOGEL PI INDICATOR 6.5 (GLOVE) ×2
GLOVE EXAM NITRILE LRG STRL (GLOVE) IMPLANT
GLOVE EXAM NITRILE MD LF STRL (GLOVE) IMPLANT
GLOVE EXAM NITRILE XL STR (GLOVE) IMPLANT
GLOVE EXAM NITRILE XS STR PU (GLOVE) IMPLANT
GLOVE INDICATOR 7.0 STRL GRN (GLOVE) ×1 IMPLANT
GLOVE SS BIOGEL STRL SZ 8 (GLOVE) ×1 IMPLANT
GLOVE SUPERSENSE BIOGEL SZ 8 (GLOVE) ×1
GOWN BRE IMP SLV AUR LG STRL (GOWN DISPOSABLE) IMPLANT
GOWN BRE IMP SLV AUR XL STRL (GOWN DISPOSABLE) ×2 IMPLANT
KIT BASIN OR (CUSTOM PROCEDURE TRAY) ×2 IMPLANT
KIT ROOM TURNOVER OR (KITS) ×2 IMPLANT
MARKER SKIN DUAL TIP RULER LAB (MISCELLANEOUS) IMPLANT
NEEDLE HYPO 22GX1.5 SAFETY (NEEDLE) ×2 IMPLANT
NS IRRIG 1000ML POUR BTL (IV SOLUTION) ×4 IMPLANT
PACK CRANIOTOMY (CUSTOM PROCEDURE TRAY) ×2 IMPLANT
PAD ARMBOARD 7.5X6 YLW CONV (MISCELLANEOUS) ×2 IMPLANT
PATTIES SURGICAL .25X.25 (GAUZE/BANDAGES/DRESSINGS) IMPLANT
PATTIES SURGICAL .5 X.5 (GAUZE/BANDAGES/DRESSINGS) IMPLANT
PATTIES SURGICAL .5 X3 (DISPOSABLE) IMPLANT
PATTIES SURGICAL 1X1 (DISPOSABLE) IMPLANT
PIN MAYFIELD SKULL DISP (PIN) IMPLANT
PLATE 1.5  2HOLE MED NEURO (Plate) ×3 IMPLANT
PLATE 1.5 2HOLE MED NEURO (Plate) IMPLANT
RUBBERBAND STERILE (MISCELLANEOUS) ×4 IMPLANT
SCREW SELF DRILL HT 1.5/4MM (Screw) ×6 IMPLANT
SPONGE GAUZE 4X4 12PLY (GAUZE/BANDAGES/DRESSINGS) ×1 IMPLANT
SPONGE NEURO XRAY DETECT 1X3 (DISPOSABLE) IMPLANT
STAPLER SKIN PROX WIDE 3.9 (STAPLE) ×2 IMPLANT
SUT ETHILON 3 0 FSL (SUTURE) ×1 IMPLANT
SUT NURALON 4 0 TR CR/8 (SUTURE) ×3 IMPLANT
SUT PROLENE 6 0 BV (SUTURE) IMPLANT
SUT VIC AB 2-0 CP2 18 (SUTURE) ×2 IMPLANT
SUT VIC AB 3-0 FS2 27 (SUTURE) ×2 IMPLANT
SUT VICRYL 4-0 PS2 18IN ABS (SUTURE) IMPLANT
SYR 20ML ECCENTRIC (SYRINGE) ×2 IMPLANT
SYR CONTROL 10ML LL (SYRINGE) ×2 IMPLANT
TOWEL OR 17X24 6PK STRL BLUE (TOWEL DISPOSABLE) ×2 IMPLANT
TOWEL OR 17X26 10 PK STRL BLUE (TOWEL DISPOSABLE) ×2 IMPLANT
TRAY FOLEY CATH 14FRSI W/METER (CATHETERS) ×1 IMPLANT
UNDERPAD 30X30 INCONTINENT (UNDERPADS AND DIAPERS) IMPLANT
WATER STERILE IRR 1000ML POUR (IV SOLUTION) ×2 IMPLANT

## 2011-12-14 NOTE — Op Note (Signed)
Brief history: The patient is a 78 year old white male who I follow for several months secondary to a chronic subdural hematoma. This hematoma has enlarged over several serial CT scans. I discussed the various treatment options with the patient including surgery. I recommend he undergo a left craniotomy to evacuate the subdural hematoma. The patient has weighed the risks, benefits, and alternatives surgery and decided proceed with an operation.  Preop diagnosis: Left subacute/chronic subdural hematoma  Postop diagnosis: The same  Procedure: Left craniotomy for evacuation of subdural hematoma  Surgeon: Dr. Delma Officer  Assistant: Dr. Orbie Hurst  Anesthesia: Gen. endotracheal  Estimated blood loss: 75 cc  Specimens: None  Drains: One subdural Jackson-Pratt drain  Complications: None  Description of procedure: The patient was brought to the operating room by the anesthesia team. General endotracheal anesthesia was induced. The patient remained in the supine position. A roll was placed under his left shoulder. The patient's calvarium was supported in the Mayfield 3 point headrest. The patient's head was turned to the right exposing his left scalp. His left scalp was then shaved with clippers and prepared with Betadine scrub and Betadine solution. Sterile drapes were applied. I then injected the area to be incised with Marcaine with epinephrine solution. I then used a scalpel to make a linear midline incision over the patient's temporoparietal scalp. I used Raney clips for wound edge hemostasis. I inserted the cerebellar retractor for exposure. I then used a high-speed drill to create burr hole. I then used a foot plate device to create small temporal parietal craniotomy flap. I then elevated the craniotomy flap with Nicholos Johns #1 and Penfield #3. This exposed the underlying dura. The dura was somewhat tense. I then incised the dura with a 15 blade scalpel in a cruciate fashion. We encountered a  subdural membrane. I coagulated the membrane with bipolar cautery. I then incised the membrane with a 15 blade scalpel. This release subdural fluid under high pressure. It was typical motor oil appearing fluid. I then used suction and irrigation to remove the subdural, from the subdural space. We encountered a deeper subdural membrane just above the arachnoid. I coagulated the deeper membrane and incised it with a scalpel  communicating the subdural spaces. I obtained hemostasis using bipolar cautery. We treated the subdural space out copiously with saline solution. When the irrigating came back crystal clear we placed a 10 over for flat Jackson-Pratt drain in the subdural space. We tunneled out through separate stab wound. Secured the drain at the exit site with 3-0 nylon suture. I then reapproximated the patient's dura with 4-0 nylon sutures. We then laid a large piece of Gelfoam over the exposed dura and then replaced the craniotomy flap with titanium plates and screws. In the process of doing this we lost one of the screws. We searched for it but could not find it. We then removed the cerebellar retractor and reapproximated patient's galea with interrupted 2-0 Vicryl suture. We reapproximated patient's skin with stainless steel staples.  In order to satisfied the hospital protocol for the lost screw we obtained a skull x-ray. In order to get a meaningful x-ray we had to remove the drapes and removed the Mayfield 3 point headrest. On the x-ray we could see the screw. At this point the patient was light from his anesthesia, the Mayfield have been removed, and the drapes were removed. I do not think he was in his best interest to replace the Mayfield, reprepped the skin, retract the patient, reintroduced anesthesia,  to remove the screw in his scalp.  The patient's wound is then coated with bacitracin ointment. A sterile dressing was applied. The drapes were removed. This patient was subsequently extubated by  the anesthesia team and transported to the post anesthesia care unit in stable condition. All sponge and needle counts were reportedly correct at the end of this case.

## 2011-12-14 NOTE — Progress Notes (Signed)
40 mg labetalol given for BP

## 2011-12-14 NOTE — Progress Notes (Signed)
UR complete 

## 2011-12-14 NOTE — Anesthesia Postprocedure Evaluation (Signed)
  Anesthesia Post-op Note  Patient: [name]  Procedure(s) Performed: Procedure(s) (LRB): CRANIOTOMY HEMATOMA EVACUATION SUBDURAL (Left)  Patient Location: PACU  Anesthesia Type: General  Level of Consciousness: awake  Airway and Oxygen Therapy: Patient Spontanous Breathing  Post-op Pain: mild  Post-op Assessment: Post-op Vital signs reviewed  Post-op Vital Signs: Reviewed  Complications: No apparent anesthesia complications

## 2011-12-14 NOTE — Progress Notes (Signed)
Patient ID: [name], male   DOB: [birth year], 78 y.o.   MRN: [id number] Subjective:  The patient is alert and pleasant. He looks well. He is in no apparent distress. And has no complaints.  Objective: Vital signs in last 24 hours: Temp:  [98 F (36.7 C)-98.1 F (36.7 C)] 98 F (36.7 C) (06/24 1215) Pulse Rate:  [62-83] 83  (06/24 1215) Resp:  [12-17] 12  (06/24 1215) BP: (140-163)/(68-83) 140/68 mmHg (06/24 1215) SpO2:  [96 %-100 %] 96 % (06/24 1215) Arterial Line BP: (169)/(75) 169/75 mmHg (06/24 1215)  Intake/Output from previous day:   Intake/Output this shift: Total I/O In: 1275 [I.V.:1275] Out: 150 [Drains:50; Blood:100]  Physical exam the patient is alert and oriented. He is moving all 4 extremities well.  Lab Results:  [facility] 12/11/11 1552  WBC 6.6  HGB 12.8*  HCT 38.2*  PLT 178   BMET  Basename 12/11/11 1552  NA 142  K 3.6  CL 103  CO2 29  GLUCOSE 101*  BUN 10  CREATININE 0.76  CALCIUM 9.2    Studies/Results: Dg Skull 1-3 Views  12/14/2011  *RADIOLOGY REPORT*  Clinical Data: Left craniotomy for hematoma.  Lost needle.  SKULL - 1-3 VIEW  Comparison: 12/07/2011  Findings: There is an extra screw that is visualized along the posterior aspect of the craniotomy defect. On this single lateral projection, this projects chest inferior to the posterior craniotomy fixation hardware.  No additional unexpected radiopaque foreign bodies.  IMPRESSION: Small extra screw along the posterior aspect of the craniotomy defect.  These results were called to the OR and Dr. Lovell Sheehan at the time of interpretation.  Original Report Authenticated By: Cyndie Chime, M.D.    Assessment/Plan: The patient is doing well status post his craniotomy.  History of alcohol abuse: The patient is on DT prophylaxis.  LOS: 0 days     Kensy Blizard D 12/14/2011, 12:47 PM

## 2011-12-14 NOTE — Transfer of Care (Signed)
Immediate Anesthesia Transfer of Care Note  Patient: [name]  Procedure(s) Performed: Procedure(s) (LRB): CRANIOTOMY HEMATOMA EVACUATION SUBDURAL (Left)  Patient Location: PACU  Anesthesia Type: General  Level of Consciousness: awake, alert , oriented and patient cooperative  Airway & Oxygen Therapy: Patient Spontanous Breathing and Patient connected to nasal cannula oxygen  Post-op Assessment: Report given to PACU RN and Post -op Vital signs reviewed and stable  Post vital signs: Reviewed and stable  Complications: No apparent anesthesia complications

## 2011-12-14 NOTE — Preoperative (Addendum)
Beta Blockers   Metoprolol 25 mg taken @ 7AM

## 2011-12-14 NOTE — Plan of Care (Signed)
Problem: Consults Goal: Diagnosis - Craniotomy Subdural hematoma     

## 2011-12-14 NOTE — Progress Notes (Signed)
Lunch relief report to Schuyler Amor, RN

## 2011-12-14 NOTE — H&P (Signed)
Subjective: The patient is a 78 year old white male who I have been following for months with a left chronic subdural hematoma. This hematoma seemed to be getting smaller initially but has progressively enlarged on serial CT scans. I discussed situation with the patient and his daughter. I recommend he undergo a left craniotomy for evacuation of the subdural hematoma. The patient has weighed the risks, benefits, and alternatives surgery decided proceed with the operation.   Past Medical History  Diagnosis Date  . Atrial fibrillation 1990  . Dyslipidemia   . Prostate hypertrophy   . Arthritis     degenerative-knees  . History of radiation therapy 05/05/07-06/15/07    right parotid through subclavicular region/ mid jugularlymph node chain  . Allergy   . Hypertension     labile  . Heart murmur     2/6 systolic  . History of chemotherapy      Hx carboplatin/56fu  . Xerostomia     limited  . Pacemaker     2003  . Diabetes mellitus     Not on any medications  . Stroke 2003    TIA  . Sleep apnea     Not diagnosied  . Bladder cancer 2010  . 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 HOH (hard of hearing)     Past Surgical History  Procedure Date  . Pacemaker insertion 1990    last changed 2003  . Cystoscopy 01/2011    neg  . Back surgery   . Parotidectomy     Allergies  Allergen Reactions  . Codeine     Blisters between fingers  . Docetaxel Rash    History  Substance Use Topics  . Smoking status: Never Smoker   . Smokeless tobacco: Not on file  . Alcohol Use: 6.0 oz/week    12 drink(s) per week     he drinks almost a galon every 2 days of hard liquur    Family History  Problem Relation Age of Onset  . Heart disease Mother   . Cancer Father   . Emphysema Brother   . Coronary artery disease Son    Prior to Admission medications   Medication Sig Start Date End Date Taking? Authorizing Provider  atorvastatin (LIPITOR) 40 MG tablet Take 40 mg by mouth daily.    Yes  Historical Provider, MD  digoxin (LANOXIN) 0.25 MG tablet Take 125 mcg by mouth daily.   Yes Historical Provider, MD  folic acid (FOLVITE) 400 MCG tablet Take 400 mcg by mouth daily.   Yes Historical Provider, MD  metoprolol succinate (TOPROL-XL) 25 MG 24 hr tablet Take 25 mg by mouth daily.   Yes Historical Provider, MD  Multiple Vitamin (MULITIVITAMIN WITH MINERALS) TABS Take 1 tablet by mouth daily.   Yes Historical Provider, MD  pantoprazole (PROTONIX) 40 MG tablet Take 40 mg by mouth daily.    Yes Historical Provider, MD  ramipril (ALTACE) 10 MG capsule Take 10 mg by mouth daily.   Yes Historical Provider, MD  zolpidem (AMBIEN) 10 MG tablet Take 10 mg by mouth at bedtime as needed. For sleep   Yes Historical Provider, MD     Review of Systems  Positive ROS: As above  All other systems have been reviewed and were otherwise negative with the exception of those mentioned in the HPI and as above.  Objective: Vital signs in last 24 hours: Temp:  [98.1 F (36.7 C)] 98.1 F (36.7 C) (06/24 0742) Pulse Rate:  [62] 62  (  06/24 0742) Resp:  [17] 17  (06/24 0742) BP: (163)/(83) 163/83 mmHg (06/24 0742) SpO2:  [100 %] 100 % (06/24 0742)  General Appearance: Alert, cooperative, no distress, appears stated age Head: Normocephalic, without obvious abnormality, atraumatic Eyes: PERRL, conjunctiva/corneas clear, EOM's intact, fundi benign, both eyes      Ears: Normal TM's and external ear canals, both ears Throat: Lips, mucosa, and tongue normal; teeth and gums normal Neck: Supple, symmetrical, trachea midline, no adenopathy; thyroid: No enlargement/tenderness/nodules; no carotid bruit or JVD Back: Symmetric, no curvature, ROM normal, no CVA tenderness Lungs: Clear to auscultation bilaterally, respirations unlabored Heart: Regular rate and rhythm, S1 and S2 normal, no murmur, rub or gallop Abdomen: Soft, non-tender, bowel sounds active all four quadrants, no masses, no  organomegaly Extremities: Extremities normal, atraumatic, no cyanosis or edema Pulses: 2+ and symmetric all extremities Skin: Skin color, texture, turgor normal, no rashes or lesions  NEUROLOGIC:   Mental status: alert and oriented, no aphasia, good attention span, Fund of knowledge/ memory ok Motor Exam - grossly normal Sensory Exam - grossly normal Reflexes:  Coordination - grossly normal Gait - grossly normal Balance - grossly normal Cranial Nerves: I: smell Not tested  II: visual acuity  OS: Normal    OD: Normal   II: visual fields Full to confrontation  II: pupils Equal, round, reactive to light  III,VII: ptosis None  III,IV,VI: extraocular muscles  Full ROM  V: mastication Normal  V: facial light touch sensation  Normal  V,VII: corneal reflex  Present  VII: facial muscle function - upper  Normal  VII: facial muscle function - lower Normal  VIII: hearing Not tested  IX: soft palate elevation  Normal  IX,X: gag reflex Present  XI: trapezius strength  5/5  XI: sternocleidomastoid strength 5/5  XI: neck flexion strength  5/5  XII: tongue strength  Normal    Data Review Lab Results  Component Value Date   WBC 6.6 12/11/2011   HGB 12.8* 12/11/2011   HCT 38.2* 12/11/2011   MCV 98.5 12/11/2011   PLT 178 12/11/2011   Lab Results  Component Value Date   NA 142 12/11/2011   K 3.6 12/11/2011   CL 103 12/11/2011   CO2 29 12/11/2011   BUN 10 12/11/2011   CREATININE 0.76 12/11/2011   GLUCOSE 101* 12/11/2011   Lab Results  Component Value Date   INR 1.13 10/02/2011    Assessment/Plan: Left chronic subdural hematoma: I discussed situation with the patient and his daughter. I reviewed the CAT scans with them and pointed out the abnormalities. We have discussed the various treatment options including surgery. I described the surgical option of a left craniotomy for evacuation of the subdural hematoma. We have discussed the risks, benefits, alternatives and likelihood of achieving  our goals with surgery. I've answered all their questions. The patient would like to proceed with the operation.   Kalil Woessner D 12/14/2011 9:32 AM

## 2011-12-14 NOTE — Anesthesia Procedure Notes (Addendum)
Date/Time: 12/14/2011 10:58 AM Performed by: Helen Hashimoto F   Procedure Name: Intubation Date/Time: 12/14/2011 10:24 AM Performed by: Tyrone Nine Pre-anesthesia Checklist: Emergency Drugs available, Suction available, Patient being monitored, Patient identified and Timeout performed Patient Re-evaluated:Patient Re-evaluated prior to inductionOxygen Delivery Method: Circle system utilized Preoxygenation: Pre-oxygenation with 100% oxygen Intubation Type: IV induction Ventilation: Mask ventilation without difficulty Laryngoscope Size: Mac and 3 Grade View: Grade I Tube type: Oral Tube size: 8.5 mm Number of attempts: 1 Airway Equipment and Method: Stylet Placement Confirmation: positive ETCO2,  CO2 detector,  ETT inserted through vocal cords under direct vision and breath sounds checked- equal and bilateral Secured at: 23 cm Tube secured with: Tape Dental Injury: Teeth and Oropharynx as per pre-operative assessment  Comments: PT. WITH LIMITED NECK MOBILITY

## 2011-12-14 NOTE — Anesthesia Preprocedure Evaluation (Addendum)
Anesthesia Evaluation  Patient identified by MRN, date of birth, ID band Patient awake    Reviewed: Allergy & Precautions, H&P , NPO status , Patient's Chart, lab work & pertinent test results  Airway Mallampati: II      Dental  (+) Teeth Intact   Pulmonary sleep apnea ,  breath sounds clear to auscultation        Cardiovascular hypertension, + dysrhythmias Atrial Fibrillation + pacemaker - Valvular Problems/MurmursRhythm:Regular Rate:Normal     Neuro/Psych TIACVA    GI/Hepatic negative GI ROS, Neg liver ROS,   Endo/Other  Diabetes mellitus-, Type 2  Renal/GU negative Renal ROS     Musculoskeletal   Abdominal   Peds  Hematology negative hematology ROS (+)   Anesthesia Other Findings   Reproductive/Obstetrics                         Anesthesia Physical Anesthesia Plan  ASA: IV  Anesthesia Plan: General   Post-op Pain Management:    Induction: Intravenous  Airway Management Planned: Oral ETT  Additional Equipment: Arterial line  Intra-op Plan:   Post-operative Plan: Possible Post-op intubation/ventilation  Informed Consent:   Plan Discussed with: CRNA  Anesthesia Plan Comments:        Anesthesia Quick Evaluation

## 2011-12-15 LAB — CBC
MCH: 33.3 pg (ref 26.0–34.0)
Platelets: 138 10*3/uL — ABNORMAL LOW (ref 150–400)
RBC: 3.42 MIL/uL — ABNORMAL LOW (ref 4.22–5.81)

## 2011-12-15 LAB — BASIC METABOLIC PANEL
Calcium: 8.1 mg/dL — ABNORMAL LOW (ref 8.4–10.5)
GFR calc non Af Amer: 87 mL/min — ABNORMAL LOW (ref >90)
Glucose, Bld: 112 mg/dL — ABNORMAL HIGH (ref 70–99)
Sodium: 134 mEq/L — ABNORMAL LOW (ref 135–145)

## 2011-12-15 MED ORDER — HALOPERIDOL LACTATE 5 MG/ML IJ SOLN
1.0000 mg | Freq: Once | INTRAMUSCULAR | Status: AC
  Administered 2011-12-16: 1 mg via INTRAMUSCULAR
  Filled 2011-12-15: qty 1

## 2011-12-15 MED ORDER — LEVETIRACETAM 500 MG PO TABS
500.0000 mg | ORAL_TABLET | Freq: Two times a day (BID) | ORAL | Status: DC
  Administered 2011-12-15 – 2011-12-17 (×5): 500 mg via ORAL
  Filled 2011-12-15 (×8): qty 1

## 2011-12-15 NOTE — Progress Notes (Signed)
Patient ID: [name], male   DOB: 08-15-1929, 78 y.o.   MRN: [id number] Subjective:  The patient is alert and pleasant. He looks well. He wants to go home.  Objective: Vital signs in last 24 hours: Temp:  [97 F (36.1 C)-98.5 F (36.9 C)] 98.5 F (36.9 C) (06/25 0400) Pulse Rate:  [62-83] 69  (06/25 0700) Resp:  [11-17] 13  (06/25 0700) BP: (118-165)/(58-94) 138/71 mmHg (06/25 0700) SpO2:  [91 %-100 %] 97 % (06/25 0700) Arterial Line BP: (169-177)/(75-85) 177/85 mmHg (06/24 1313) Weight:  [81.8 kg (180 lb 5.4 oz)-85.7 kg (188 lb 15 oz)] 85.7 kg (188 lb 15 oz) (06/25 0400)  Intake/Output from previous day: 06/24 0701 - 06/25 0700 In: 3485 [P.O.:480; I.V.:2700; IV Piggyback:305] Out: 710 [Urine:300; Drains:310; Blood:100] Intake/Output this shift:    Physical exam the patient is alert and oriented x3. He is moving all 4 extremities well. There is no weakness. His speech is normal.  Lab Results:  Basename 12/15/11 0530  WBC 7.7  HGB 11.4*  HCT 33.2*  PLT 138*   BMET  Basename 12/15/11 0530  NA 134*  K 3.4*  CL 100  CO2 25  GLUCOSE 112*  BUN 10  CREATININE 0.70  CALCIUM 8.1*    Studies/Results: Dg Skull 1-3 Views  12/14/2011  *RADIOLOGY REPORT*  Clinical Data: Left craniotomy for hematoma.  Lost needle.  SKULL - 1-3 VIEW  Comparison: 12/07/2011  Findings: There is an extra screw that is visualized along the posterior aspect of the craniotomy defect. On this single lateral projection, this projects chest inferior to the posterior craniotomy fixation hardware.  No additional unexpected radiopaque foreign bodies.  IMPRESSION: Small extra screw along the posterior aspect of the craniotomy defect.  These results were called to the OR and Dr. Lovell Sheehan at the time of interpretation.  Original Report Authenticated By: Cyndie Chime, M.D.   Ct Head Wo Contrast  12/14/2011  *RADIOLOGY REPORT*  Clinical Data: Postop subdural evacuation  CT HEAD WITHOUT CONTRAST  Technique:   Contiguous axial images were obtained from the base of the skull through the vertex without contrast.  Comparison: 12/07/2011  Findings: The patient is status post left frontal craniotomy for subdural evacuation.    A subdural drain is in place. The subdural hematoma appears improved compared with the preoperative appearance on 12/07/11, although moderate left frontal extra-axial pneumocephalus does exert slight mass effect on the left frontal lobe.  There is a mixed attenuation right subdural hematoma which appears slightly increased from priors.  Maximal thickness as measured on image 28 is 19 mm; the more dependent component of this right subdural appears increasingly hyperdense, suggesting recent hemorrhage, as seen on image 28, measuring 10 mm thick.   No intra-axial hematoma or stroke.  Moderate atrophy with chronic microvascular ischemic change.   Post craniotomy changes noted left frontal region. A small screw projects below the more posterior titanium plate (image 24, series 3) within the scalp.  IMPRESSION: Status post left frontal craniotomy for subdural evacuation, overall improved. Moderate left frontal pneumocephalus does exert mild mass effect on the left frontal lobe.  Slight worsening right subdural hematoma.  Original Report Authenticated By: Elsie Stain, M.D.    Assessment/Plan: Postop day #1: The patient is doing well clinically. His followup scan looked good. We will mobilize him and DC his a line et Karie Soda.  LOS: 1 day     Kindred Reidinger D 12/15/2011, 7:41 AM

## 2011-12-15 NOTE — Progress Notes (Addendum)
Pt agitated, got out of bed. toileting offered. Pt used urinal. Pt oriented, but became combative and verbally abusive to staff despite staff's emotional support and attempts to de-escalate situation. Security called and arrived to bedside, spoke with pt. Pt stated he would not cooperate with staff, but is currently sitting in chair. Refuses to be attached to monitors. States "I just want to be left alone. That's exactly what I want." Pt stated he wanted to go home. Neurosurgeon paged. Will continue to monitor pt closely.   Holly Bodily

## 2011-12-16 MED ORDER — LORAZEPAM 2 MG/ML IJ SOLN
0.5000 mg | INTRAMUSCULAR | Status: DC | PRN
  Administered 2011-12-18 (×2): 0.5 mg via INTRAVENOUS
  Filled 2011-12-16 (×2): qty 1

## 2011-12-16 MED ORDER — HALOPERIDOL LACTATE 5 MG/ML IJ SOLN
1.0000 mg | Freq: Once | INTRAMUSCULAR | Status: AC
  Administered 2011-12-16: 1 mg via INTRAMUSCULAR

## 2011-12-16 MED ORDER — HALOPERIDOL LACTATE 5 MG/ML IJ SOLN
INTRAMUSCULAR | Status: AC
  Filled 2011-12-16: qty 1

## 2011-12-16 NOTE — Progress Notes (Signed)
Patient ID: [name], male   DOB: 1929-06-29, 78 y.o.   MRN: [id number] Subjective:  The patient is mildly somnolent but easily arousable. He is pleasant but confused. He is in no apparent distress  Objective: Vital signs in last 24 hours: Temp:  [97.6 F (36.4 C)-99.4 F (37.4 C)] 97.6 F (36.4 C) (06/26 0800) Pulse Rate:  [69-122] 70  (06/26 1000) Resp:  [10-21] 17  (06/26 1000) BP: (116-163)/(48-99) 151/56 mmHg (06/26 1000) SpO2:  [92 %-100 %] 95 % (06/26 1000)  Intake/Output from previous day: 06/25 0701 - 06/26 0700 In: 1035 [P.O.:960; I.V.:75] Out: 1328 [Urine:1275; Drains:53] Intake/Output this shift: Total I/O In: 60 [P.O.:60] Out: 1050 [Urine:1050]  Physical exam the patient is alert and pleasant as above. He is oriented only to person. His strength is normal in all 4 extremities. His pupils are equal reactive. His wound is healing well without signs of infection. I removed the JP drain as it was draining largely spinal fluid at this point.  Lab Results:  Basename 12/15/11 0530  WBC 7.7  HGB 11.4*  HCT 33.2*  PLT 138*   BMET  Basename 12/15/11 0530  NA 134*  K 3.4*  CL 100  CO2 25  GLUCOSE 112*  BUN 10  CREATININE 0.70  CALCIUM 8.1*    Studies/Results: Dg Skull 1-3 Views  12/14/2011  *RADIOLOGY REPORT*  Clinical Data: Left craniotomy for hematoma.  Lost needle.  SKULL - 1-3 VIEW  Comparison: 12/07/2011  Findings: There is an extra screw that is visualized along the posterior aspect of the craniotomy defect. On this single lateral projection, this projects chest inferior to the posterior craniotomy fixation hardware.  No additional unexpected radiopaque foreign bodies.  IMPRESSION: Small extra screw along the posterior aspect of the craniotomy defect.  These results were called to the OR and Dr. Lovell Sheehan at the time of interpretation.  Original Report Authenticated By: Cyndie Chime, M.D.   Ct Head Wo Contrast  12/14/2011  *RADIOLOGY REPORT*  Clinical  Data: Postop subdural evacuation  CT HEAD WITHOUT CONTRAST  Technique:  Contiguous axial images were obtained from the base of the skull through the vertex without contrast.  Comparison: 12/07/2011  Findings: The patient is status post left frontal craniotomy for subdural evacuation.    A subdural drain is in place. The subdural hematoma appears improved compared with the preoperative appearance on 12/07/11, although moderate left frontal extra-axial pneumocephalus does exert slight mass effect on the left frontal lobe.  There is a mixed attenuation right subdural hematoma which appears slightly increased from priors.  Maximal thickness as measured on image 28 is 19 mm; the more dependent component of this right subdural appears increasingly hyperdense, suggesting recent hemorrhage, as seen on image 28, measuring 10 mm thick.   No intra-axial hematoma or stroke.  Moderate atrophy with chronic microvascular ischemic change.   Post craniotomy changes noted left frontal region. A small screw projects below the more posterior titanium plate (image 24, series 3) within the scalp.  IMPRESSION: Status post left frontal craniotomy for subdural evacuation, overall improved. Moderate left frontal pneumocephalus does exert mild mass effect on the left frontal lobe.  Slight worsening right subdural hematoma.  Original Report Authenticated By: Elsie Stain, M.D.    Assessment/Plan: Postop day #2: The patient is a bit confused. I suspect this is multifactorial. The patient does have a history of alcohol abuse and is on the DT prophylaxis. I will repeat his sodium and CAT scan tomorrow.  LOS: 2 days     Camreigh Michie D 12/16/2011, 11:49 AM

## 2011-12-17 ENCOUNTER — Encounter (HOSPITAL_COMMUNITY): Payer: Self-pay | Admitting: Neurosurgery

## 2011-12-17 ENCOUNTER — Inpatient Hospital Stay (HOSPITAL_COMMUNITY): Payer: Medicare Other

## 2011-12-17 MED ORDER — POTASSIUM CHLORIDE CRYS ER 20 MEQ PO TBCR
40.0000 meq | EXTENDED_RELEASE_TABLET | Freq: Three times a day (TID) | ORAL | Status: AC
  Administered 2011-12-17 – 2011-12-18 (×3): 40 meq via ORAL
  Filled 2011-12-17 (×6): qty 2

## 2011-12-17 NOTE — Progress Notes (Addendum)
Patient began slurring speech and having a hard time making words around 12:45pm when talking with daughter. Pupils equal and reactive, Patient moves all extremeties with equal strength. Patient can state name, month, and year with encouragement. Patient seems to go in and out of this speech pattern, from slurred frustrated speech to clear sentences.  Dr. Lovell Sheehan called and stat CT of head performed. Will continue to monitor closely.  Dr. Venetia Maxon notified at 1310 that CT was completed and results were ready. Dr. Venetia Maxon stated he would relay information to Dr. Lovell Sheehan in OR. Will continue to monitor closely.

## 2011-12-17 NOTE — Progress Notes (Signed)
Report given to [facility].  Patient resting, VSS, no distress noted.  Sitter at bedside.  Continue to monitor.

## 2011-12-17 NOTE — Progress Notes (Signed)
Patient ID: [name], male   DOB: 1930/04/26, 78 y.o.   MRN: [id number] Subjective:  The patient is alert and pleasant. He looks much better today he has no complaints except that he wants to go home.  Objective: Vital signs in last 24 hours: Temp:  [98.3 F (36.8 C)-99.2 F (37.3 C)] 98.6 F (37 C) (06/27 0400) Pulse Rate:  [69-75] 69  (06/27 1100) Resp:  [11-20] 14  (06/27 1100) BP: (104-151)/(49-87) 109/55 mmHg (06/27 1100) SpO2:  [90 %-100 %] 96 % (06/27 1100)  Intake/Output from previous day: 06/26 0701 - 06/27 0700 In: 565 [P.O.:540] Out: 1050 [Urine:1050] Intake/Output this shift:    Physical exam the patient is alert and oriented x3. His motor strength is grossly normal. His speech is normal. His pupils are equal reactive. His incision is healing well without signs of infection.  Lab Results:  Basename 12/15/11 0530  WBC 7.7  HGB 11.4*  HCT 33.2*  PLT 138*   BMET  Basename 12/15/11 0530  NA 134*  K 3.4*  CL 100  CO2 25  GLUCOSE 112*  BUN 10  CREATININE 0.70  CALCIUM 8.1*    Studies/Results: Ct Head Wo Contrast  12/17/2011  *RADIOLOGY REPORT*  Clinical Data: Craniotomy for subdural hematoma  CT HEAD WITHOUT CONTRAST  Technique:  Contiguous axial images were obtained from the base of the skull through the vertex without contrast.  Comparison: CT 12/14/2011  Findings: Left frontal craniotomy for subdural drainage. Pneumocephalus on the left has improved, now measuring 18 mm compared with 22 mm.  Mixed density subdural hematoma on the left is unchanged from the  prior CT.  Mixed density subdural hematoma over the convexity on the right measures 12 mm.  It is slightly smaller and lower density compared with the prior CT.  Ventricle size is normal.  2 mm midline shift to the right is unchanged.  No acute infarct.  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  No mass lesion.  IMPRESSION: Pneumocephalus on the left has improved.  There remains a small mixed  density subdural hematoma on the left.  Mixed density subdural hematoma on the right shows interval improvement.  Original Report Authenticated By: Camelia Phenes, M.D.    Assessment/Plan: Postop day #3: The patient's postoperative scan looked good. He still well clinically. I will transfer him to 3000.  Hyponatremia: This is mild and asymptomatic.  Hypokalemia: Will give him some potassium. And repeat his BMP tomorrow.  LOS: 3 days     Chelsea Pedretti D 12/17/2011, 11:12 AM

## 2011-12-18 LAB — BASIC METABOLIC PANEL
CO2: 26 mEq/L (ref 19–32)
Calcium: 9.3 mg/dL (ref 8.4–10.5)
GFR calc Af Amer: >90 mL/min (ref >90)
GFR calc non Af Amer: 87 mL/min — ABNORMAL LOW (ref >90)
Sodium: 135 mEq/L (ref 135–145)

## 2011-12-18 MED ORDER — HALOPERIDOL LACTATE 5 MG/ML IJ SOLN
INTRAMUSCULAR | Status: AC
  Filled 2011-12-18: qty 1

## 2011-12-18 MED ORDER — PANTOPRAZOLE SODIUM 40 MG PO TBEC: 40.0000 mg | DELAYED_RELEASE_TABLET | Freq: Every day | ORAL | Status: DC

## 2011-12-18 MED ORDER — PANTOPRAZOLE SODIUM 40 MG IV SOLR
40.0000 mg | INTRAVENOUS | Status: DC
  Filled 2011-12-18: qty 40

## 2011-12-18 MED ORDER — LABETALOL HCL 5 MG/ML IV SOLN
10.0000 mg | INTRAVENOUS | Status: DC | PRN
  Administered 2011-12-18: 10 mg via INTRAVENOUS
  Filled 2011-12-18: qty 4

## 2011-12-18 MED ORDER — DIGOXIN 0.25 MG/ML IJ SOLN
0.1250 mg | Freq: Every day | INTRAMUSCULAR | Status: DC
  Administered 2011-12-18 – 2011-12-21 (×4): 0.125 mg via INTRAVENOUS
  Filled 2011-12-18 (×7): qty 0.5

## 2011-12-18 MED ORDER — THIAMINE HCL 100 MG/ML IJ SOLN
100.0000 mg | Freq: Every day | INTRAMUSCULAR | Status: DC
  Administered 2011-12-18 – 2011-12-21 (×4): 100 mg via INTRAVENOUS
  Filled 2011-12-18 (×5): qty 1

## 2011-12-18 MED ORDER — SODIUM CHLORIDE 0.9 % IV SOLN
500.0000 mg | Freq: Two times a day (BID) | INTRAVENOUS | Status: DC
  Administered 2011-12-18 – 2011-12-21 (×7): 500 mg via INTRAVENOUS
  Filled 2011-12-18 (×8): qty 5

## 2011-12-18 MED ORDER — HALOPERIDOL LACTATE 5 MG/ML IJ SOLN
5.0000 mg | Freq: Four times a day (QID) | INTRAMUSCULAR | Status: DC | PRN
  Administered 2011-12-18 (×2): 5 mg via INTRAVENOUS
  Filled 2011-12-18: qty 1

## 2011-12-18 MED ORDER — FOLIC ACID 5 MG/ML IJ SOLN
1.0000 mg | Freq: Every day | INTRAMUSCULAR | Status: DC
  Administered 2011-12-18 – 2011-12-21 (×3): 1 mg via INTRAVENOUS
  Filled 2011-12-18 (×6): qty 0.2

## 2011-12-18 MED ORDER — LACTATED RINGERS IV SOLN
INTRAVENOUS | Status: DC
  Administered 2011-12-18: 1000 mL via INTRAVENOUS
  Administered 2011-12-18: via INTRAVENOUS

## 2011-12-18 MED ORDER — PANTOPRAZOLE SODIUM 40 MG IV SOLR
40.0000 mg | Freq: Every day | INTRAVENOUS | Status: DC
  Administered 2011-12-18 – 2011-12-20 (×3): 40 mg via INTRAVENOUS
  Filled 2011-12-18 (×4): qty 40

## 2011-12-18 MED ORDER — HALOPERIDOL LACTATE 5 MG/ML IJ SOLN
1.0000 mg | Freq: Once | INTRAMUSCULAR | Status: AC
  Administered 2011-12-18: 1 mg via INTRAVENOUS
  Filled 2011-12-18: qty 1

## 2011-12-18 NOTE — Progress Notes (Signed)
Pt extremely agitated at 0300 hrs . Dr Venetia Maxon on call notified and I recommended restraints(belt) that were ordered after consulting with fellow RNs on floor. Haldol also added to help with agitation per Dr Venetia Maxon.

## 2011-12-18 NOTE — Progress Notes (Signed)
Report called to receiving nurse on 3000. Pt t/f to 3011.   [name]

## 2011-12-19 NOTE — Progress Notes (Signed)
Subjective: Sleeping - arousable - follows simple commands -   Objective: Vital signs in last 24 hours: Temp:  [97.8 F (36.6 C)-97.9 F (36.6 C)] 97.8 F (36.6 C) (06/29 0558) Pulse Rate:  [69-75] 69  (06/29 0856) Resp:  [16-22] 20  (06/28 1819) BP: (121-182)/(58-97) 138/75 mmHg (06/29 0856) SpO2:  [92 %-96 %] 96 % (06/29 0558)  Intake/Output from previous day: 06/28 0701 - 06/29 0700 In: 30 [P.O.:30] Out: 1275 [Urine:1275] Intake/Output this shift:    moves all 4,-  incision - c/d/i  Lab Results: No results found for this basename: WBC:2,HGB:2,HCT:2,PLT:2 in the last 72 hours BMET  [facility] 12/18/11 0410  NA 135  K 3.9  CL 97  CO2 26  GLUCOSE 135*  BUN 7  CREATININE 0.70  CALCIUM 9.3    Studies/Results: Ct Head Wo Contrast  12/17/2011  *RADIOLOGY REPORT*  Clinical Data: Slurred speech.  Altered mental status.  CT HEAD WITHOUT CONTRAST  Technique:  Contiguous axial images were obtained from the base of the skull through the vertex without contrast.  Comparison: Head CT 12/17/2011.  Findings: There is again a complex left sided subdural collection of gas and fluid.  This measures up to 18 mm in width (unchanged). The fluid has mixed attenuation with areas of low attenuation and high attenuation, suggesting blood products in different phases of degradation.  There is also a small right-sided subdural fluid collection that contains heterogeneous attenuation, which also likely represents a mixed phase subdural hematoma.  This too is unchanged in thickness (up to 12 mm).  Both of these collections exerts some mass effect on the underlying brain parenchyma, which is unchanged.  There is an area of low attenuation in the left frontal lobe white matter, compatible with some encephalomalacia (unchanged).  No definite acute intraparenchymal hemorrhage, no intra ventricular hemorrhage, and it no evidence of hydrocephalous. Mild cerebral and cerebellar atrophy are again noted.  Postoperative changes of the left frontal craniotomy are again noted.  Visualized paranasal sinuses and mastoids are generally well pneumatized, with exception of a right mastoid effusion.  IMPRESSION: 1.  Overall, the appearance the brain is unchanged compared to the CT scan from earlier today, as detailed above. 2.  Right mastoid effusion is unchanged.  Original Report Authenticated By: Florencia Reasons, M.D.    Assessment/Plan: CPM  LOS: 5 days     Aiven Kampe R, MD 12/19/2011, 9:36 AM

## 2011-12-20 MED ORDER — MAGNESIUM HYDROXIDE 400 MG/5ML PO SUSP
15.0000 mL | Freq: Every day | ORAL | Status: DC | PRN
  Administered 2011-12-20: 15 mL via ORAL
  Filled 2011-12-20: qty 30

## 2011-12-20 NOTE — Progress Notes (Signed)
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 Will benefit from acute OT services to address below problem list in prep for d/c to CIR vs SNF.      OT Assessment  Patient needs continued OT Services    Follow Up Recommendations  Inpatient Rehab;Skilled nursing facility    Barriers to Discharge      Equipment Recommendations  Defer to next venue    Recommendations for Other Services Rehab consult  Frequency  Min 2X/week    Precautions / Restrictions Precautions Precautions: Fall;Other (comment) (crani) Restrictions Weight Bearing Restrictions: No   Pertinent Vitals/Pain See vitals    ADL  Eating/Feeding: Performed;Independent Where Assessed - Eating/Feeding: Bed level Grooming: Performed;Wash/dry hands;Brushing hair;Supervision/safety Where Assessed - Grooming: Unsupported sitting Lower Body Dressing: Performed;+1 Total assistance Where Assessed - Lower Body Dressing: Supported sit to stand Toilet Transfer: Simulated;+2 Total assistance;Maximal assistance (see transfer/ambulation comments) Toilet Transfer: Patient Percentage: 40% Toilet Transfer Method: Stand pivot;Other (comment) (ambulating) Toilet Transfer Equipment: Other (comment) (chair) Equipment Used: Gait belt Transfers/Ambulation Related to ADLs: +2 assist with ambulation from bed to chair with 2 hand held assist for balance.  Pt requires increased time to obtain upright posture after standing.  Requires assist for weight shifting and manual facilitation to move RLE during ambulation.  Pt ambulated to straight back chair but then requesting to sit in recliner.  Max assist for stand pivot from straight back chair to recliner.  ADL Comments: Pt  attempted to don socks while sitting EOB.  Pt able to cross ankles over knees but with significant posterior lean so unable to reach feet.      OT Diagnosis: Generalized weakness;Cognitive deficits;Acute pain  OT Problem List: Decreased strength;Decreased activity tolerance;Impaired balance (sitting and/or standing);Decreased cognition;Decreased safety awareness;Decreased knowledge of use of DME or AE;Decreased knowledge of precautions;Pain OT Treatment Interventions: Self-care/ADL training;DME and/or AE instruction;Therapeutic activities;Cognitive remediation/compensation;Patient/family education;Balance training   OT Goals Acute Rehab OT Goals OT Goal Formulation: With patient Time For Goal Achievement: 12/27/11 Potential to Achieve Goals: Good ADL Goals Pt Will Perform Grooming: with modified independence;Standing at sink ADL Goal: Grooming - Progress: Goal set today Pt Will Perform Lower Body Bathing: with modified independence;Sit to stand from chair;Sit to stand from bed;with adaptive equipment ADL Goal: Lower Body Bathing - Progress: Goal set today Pt Will Perform Lower Body Dressing: with modified independence;Sit to stand from chair;Sit to stand from bed;with adaptive equipment ADL Goal: Lower Body Dressing - Progress: Goal set today Pt Will Transfer to Toilet: with modified independence;with DME;Ambulation;Comfort height toilet ADL Goal: Toilet Transfer - Progress: Goal set today Miscellaneous OT Goals Miscellaneous OT Goal #1: Pt will independently perform bil UE HEP to increase bil. UE strength to 4/5 in prep for functional transfers. OT Goal: Miscellaneous Goal #1 - Progress: Goal set today  Visit Information  Last OT Received On: 12/20/11 Assistance Needed: +2 PT/OT Co-Evaluation/Treatment: Yes    Subjective Data      Prior Functioning  Home Living Lives With: Daughter Available Help at Discharge: Family;Available 24 hours/day Type of Home: House Home Access: Stairs  to enter [employer] of Steps: 4 Entrance Stairs-Rails: Can reach both Home Layout: One level Bathroom Shower/Tub: Tub/shower unit;Door [employer] Toilet: Standard Bathroom Accessibility: Yes How Accessible: Accessible via walker Home Adaptive Equipment:  Shower chair with back;Walker - rolling;Straight cane;Bedside commode/3-in-1;Reacher;Sock aid Prior Function Level of Independence: Independent Able to Take Stairs?: Yes Driving: Yes 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 Following Commands: Follows one step commands consistently Safety/Judgement: Decreased safety judgement for tasks assessed Safety/Judgement - Other Comments: Attempting to reach for chair to sit down when still ~3 ft from chair. Cognition - Other Comments: pt had difficulty remembering information prior to surgery, although some memories intact    Extremity/Trunk Assessment Right Upper Extremity Assessment RUE ROM/Strength/Tone: [facility] for tasks assessed;Deficits RUE ROM/Strength/Tone Deficits: Generalized weakness 3+/5 throughout Left Upper Extremity Assessment LUE ROM/Strength/Tone: WFL for tasks assessed;Deficits LUE ROM/Strength/Tone Deficits: Generalized weakness 3+/5 throughout Right Lower Extremity Assessment RLE ROM/Strength/Tone: Deficits RLE ROM/Strength/Tone Deficits: Generalized weakness >/= 4/5 RLE Sensation: WFL - Light Touch Left Lower Extremity Assessment LLE ROM/Strength/Tone: Deficits LLE ROM/Strength/Tone Deficits: Generalized weakness >/= 4/5 LLE Sensation: WFL - Light Touch   Mobility Bed Mobility Bed Mobility: Supine to Sit;Sitting -  Scoot to Edge of Bed Supine to Sit: 4: Min assist;With rails;HOB elevated Sitting - Scoot to Edge of Bed: 6: Modified independent (Device/Increase time) Details for Bed Mobility Assistance: VC for sequencing. Assist through trunk for stability into sitting. Transfers Sit to Stand: 3: Mod assist;With upper extremity assist;From bed;From chair/3-in-1 Stand to Sit: 3: Mod assist;With upper extremity assist;To chair/3-in-1 Details for Transfer Assistance: VC for safe hand placement. SPT from straight back chair to recliner. With UE stability, pt was able to pivot using RLE advancement.   Exercise    Balance    End of Session OT - End of Session Equipment Utilized During Treatment: Gait belt Activity Tolerance: Patient tolerated treatment well Patient left: in chair;with call bell/phone within reach Nurse Communication: Mobility status  GO    12/20/2011 Cipriano Mile OTR/L Pager [phone] Office [phone]  Cipriano Mile 12/20/2011, 10:45 AM

## 2011-12-20 NOTE — Progress Notes (Addendum)
RN notified of abnormal BP

## 2011-12-20 NOTE — Progress Notes (Signed)
Patient ID: [name], male   DOB: 1930/01/28, 78 y.o.   MRN: [id number] Doing better. Awake, alert, conversant. Follows complex commands well. Will use laxative for constipation, but otherwise doing much better.

## 2011-12-20 NOTE — Evaluation (Signed)
Physical Therapy Evaluation Patient Details Name: [name] MRN: [id number] DOB: 1929-08-27 Today's Date: 12/20/2011 Time: 4098-1191 PT Time Calculation (min): 25 min  PT Assessment / Plan / Recommendation Clinical Impression  Pt s/p crani on 6/24. Pt has been agitated and confused, now oriented and aware. Pt with decreased functional mobility and strength. Pt will benefit from skilled PT in the acute care setting in order to maximize functional mobility and safety. Pt would benefit from additional rehab prior to d/c home with daughter.    PT Assessment  Patient needs continued PT services    Follow Up Recommendations  Inpatient Rehab    Barriers to Discharge        Equipment Recommendations  Defer to next venue    Recommendations for Other Services Rehab consult   Frequency Min 4X/week    Precautions / Restrictions Precautions Precautions: Fall;Other (comment) (crani) Restrictions Weight Bearing Restrictions: No         Mobility  Bed Mobility Bed Mobility: Supine to Sit;Sitting - Scoot to Edge of Bed Supine to Sit: 4: Min assist;With rails;HOB elevated Sitting - Scoot to Edge of Bed: 6: Modified independent (Device/Increase time) Details for Bed Mobility Assistance: VC for sequencing. Assist through trunk for stability into sitting. Transfers Transfers: Sit to Stand;Stand to [employer] Transfers Sit to Stand: 3: Mod assist;With upper extremity assist;From bed;From chair/3-in-1 Stand to Sit: 3: Mod assist;With upper extremity assist;To chair/3-in-1 Stand Pivot Transfers: 2: Max assist Details for Transfer Assistance: VC for safe hand placement. SPT from straight back chair to recliner. With UE stability, pt was able to pivot using RLE advancement. Ambulation/Gait Ambulation/Gait Assistance: 1: +2 Total assist Ambulation/Gait: Patient Percentage: 40% Ambulation Distance (Feet): 3 Feet Assistive device: 2 person hand held assist Ambulation/Gait Assistance  Details: VC for sequencing. Pt unable to advance RLE, required physical assist for weight shifting left to advance RLE. Will attempt RW next session. Gait Pattern: Step-to pattern;Decreased hip/knee flexion - left;Decreased hip/knee flexion - right;Decreased stride length;Decreased weight shift to left;Wide base of support    Exercises     PT Diagnosis: Difficulty walking  PT Problem List: Decreased strength;Decreased activity tolerance;Decreased mobility;Decreased knowledge of use of DME;Decreased cognition;Decreased safety awareness;Decreased knowledge of precautions PT Treatment Interventions: DME instruction;Gait training;Stair training;Functional mobility training;Neuromuscular re-education;Balance training;Therapeutic activities;Therapeutic exercise;Patient/family education   PT Goals Acute Rehab PT Goals PT Goal Formulation: With patient Time For Goal Achievement: 12/27/11 Potential to Achieve Goals: Good Pt will go Supine/Side to Sit: with modified independence PT Goal: Supine/Side to Sit - Progress: Goal set today Pt will go Sit to Supine/Side: with modified independence PT Goal: Sit to Supine/Side - Progress: Goal set today Pt will go Sit to Stand: with supervision PT Goal: Sit to Stand - Progress: Goal set today Pt will go Stand to Sit: with supervision PT Goal: Stand to Sit - Progress: Goal set today Pt will Transfer Bed to Chair/Chair to Bed: with min assist PT Transfer Goal: Bed to Chair/Chair to Bed - Progress: Goal set today Pt will Ambulate: 16 - 50 feet;with least restrictive assistive device;with min assist PT Goal: Ambulate - Progress: Goal set today  Visit Information  Last PT Received On: 12/20/11 Assistance Needed: +2 PT/OT Co-Evaluation/Treatment: Yes    Subjective Data      Prior Functioning  Home Living Lives With: Daughter Available Help at Discharge: Family;Available 24 hours/day Type of Home: House Home Access: Stairs to enter [employer] of Steps: 4 Entrance Stairs-Rails: Can reach both Home Layout: One  level Bathroom Shower/Tub: Tub/shower unit;Door [employer] Toilet: Standard Bathroom Accessibility: Yes How Accessible: Accessible via walker Home Adaptive Equipment: Shower chair with back;Walker - rolling;Straight cane;Bedside commode/3-in-1;Reacher;Sock aid Prior Function Level of Independence: Independent Able to Take Stairs?: Yes Driving: Yes Vocation: Retired Musician: No difficulties Dominant Hand: Right    Cognition  Overall Cognitive Status: No family/caregiver present to determine baseline cognitive functioning Arousal/Alertness: Awake/alert Orientation Level: Appears intact for tasks assessed Behavior During Session: [facility] for tasks performed Cognition - Other Comments: pt had difficulty remembering information prior to surgery, although some memories intact    Extremity/Trunk Assessment Right Lower Extremity Assessment RLE ROM/Strength/Tone: Deficits RLE ROM/Strength/Tone Deficits: Generalized weakness >/= 4/5 RLE Sensation: WFL - Light Touch Left Lower Extremity Assessment LLE ROM/Strength/Tone: Deficits LLE ROM/Strength/Tone Deficits: Generalized weakness >/= 4/5 LLE Sensation: WFL - Light Touch   Balance    End of Session PT - End of Session Equipment Utilized During Treatment: Gait belt Activity Tolerance: Patient tolerated treatment well Patient left: in chair;with call bell/phone within reach Nurse Communication: Mobility status    Milana Kidney 12/20/2011, 9:09 AM  12/20/2011 Milana Kidney DPT PAGER: [phone] OFFICE: [phone]

## 2011-12-20 NOTE — Progress Notes (Signed)
RN notified of abnormal BP

## 2011-12-20 NOTE — Progress Notes (Signed)
Pt is alert and oriented X3, he is very pleasant. When asked he sts he doesn't know why restraints were used and sts he doesn't remember anything from two days ago except dreaming about beach and boat. Considering improvement in his mental status this RN has decided to d/c restraints and keep monitoring pr closely.

## 2011-12-21 MED ORDER — PANTOPRAZOLE SODIUM 40 MG PO TBEC
40.0000 mg | DELAYED_RELEASE_TABLET | Freq: Every day | ORAL | Status: DC
  Administered 2011-12-21: 40 mg via ORAL
  Filled 2011-12-21: qty 1

## 2011-12-21 MED ORDER — LEVETIRACETAM 500 MG PO TABS
500.0000 mg | ORAL_TABLET | Freq: Two times a day (BID) | ORAL | Status: DC
  Administered 2011-12-21 – 2011-12-22 (×2): 500 mg via ORAL
  Filled 2011-12-21 (×3): qty 1

## 2011-12-21 MED ORDER — VITAMIN B-1 100 MG PO TABS
100.0000 mg | ORAL_TABLET | Freq: Every day | ORAL | Status: DC
  Administered 2011-12-22: 100 mg via ORAL
  Filled 2011-12-21: qty 1

## 2011-12-21 MED ORDER — DIGOXIN 125 MCG PO TABS
0.1250 mg | ORAL_TABLET | Freq: Every day | ORAL | Status: DC
  Administered 2011-12-22: 0.125 mg via ORAL
  Filled 2011-12-21: qty 1

## 2011-12-21 MED ORDER — FOLIC ACID 1 MG PO TABS
1.0000 mg | ORAL_TABLET | Freq: Every day | ORAL | Status: DC
  Administered 2011-12-22: 1 mg via ORAL
  Filled 2011-12-21: qty 1

## 2011-12-21 NOTE — Progress Notes (Signed)
Speech Pathology Cancellation Note  Treatment cancelled today due to patient receiving procedure or test.  Myra Rude, M.S.,CCC-SLP Pager [phone] 12/21/2011, 3:31 PM

## 2011-12-21 NOTE — Progress Notes (Signed)
Subjective: Patient reports no c/o  Objective: Vital signs in last 24 hours: Temp:  [97.5 F (36.4 C)-99.1 F (37.3 C)] 97.5 F (36.4 C) (07/01 0200) Pulse Rate:  [65-70] 65  (07/01 0200) Resp:  [18-20] 18  (07/01 0200) BP: (139-166)/(70-83) 159/77 mmHg (07/01 0200) SpO2:  [96 %-98 %] 98 % (07/01 0200)  Intake/Output from previous day: 06/30 0701 - 07/01 0700 In: 240 [P.O.:240] Out: 3400 [Urine:3400] Intake/Output this shift:    AAOx3 , up inchair - ambulating well - incision C/D/I  Lab Results: No results found for this basename: WBC:2,HGB:2,HCT:2,PLT:2 in the last 72 hours BMET No results found for this basename: NA:2,K:2,CL:2,CO2:2,GLUCOSE:2,BUN:2,CREATININE:2,CALCIUM:2 in the last 72 hours  Studies/Results: No results found.  Assessment/Plan: Much improved from sat - increase activity - plan d/c home in am  LOS: 7 days     Jamel Holzmann R, MD 12/21/2011, 9:39 AM

## 2011-12-21 NOTE — Progress Notes (Signed)
Physical Therapy Treatment Patient Details Name: [name] MRN: [id number] DOB: 07/30/1929 Today's Date: 12/21/2011 Time: 4098-1191 PT Time Calculation (min): 29 min  PT Assessment / Plan / Recommendation Comments on Treatment Session  Pt was able to increase ambulation in this session, ambulating down the hall.  Pt demonstates gait deviations related to difficulty to advancing RLE.  Pt was fatigued by ambulation, but eager to participate in therapy.  Continue with POC.      Follow Up Recommendations  Inpatient Rehab    Barriers to Discharge        Equipment Recommendations  Defer to next venue    Recommendations for Other Services Rehab consult  Frequency Min 4X/week   Plan Discharge plan remains appropriate;Frequency remains appropriate    Precautions / Restrictions Precautions Precautions: Fall;Other (comment) (crani) Restrictions Weight Bearing Restrictions: No   Pertinent Vitals/Pain     Mobility  Transfers Sit to Stand: 3: Mod assist;With upper extremity assist;From bed;From chair/3-in-1 Stand to Sit: 3: Mod assist;With upper extremity assist;To chair/3-in-1;With armrests Details for Transfer Assistance: Pt required VC for proper positioning/hand placement.  A required to allow for increased time for full knee extension and for inital balance after standing. VC and assist to slow descent into chair. Ambulation/Gait Ambulation/Gait Assistance: 3: Mod assist Ambulation Distance (Feet): 120 Feet Assistive device: Rolling walker Ambulation/Gait Assistance Details: VC for safe hand placement on RW.  Pt required A for balance during weight-shifting to advance RLE.  Pt required repeated VC for upright posture and to lift gaze from the floor.   Gait Pattern: Step-to pattern;Decreased stance time - right;Shuffle;Wide base of support;Trunk flexed;Decreased stride length;Lateral trunk lean to right Gait velocity: decreased gait speed General Gait Details: Pt demonstrated  flexed trunk and difficulty advancing RLE.  Pt was followed with chair.   Pt took 5 min seated rest break.     Exercises     PT Diagnosis:    PT Problem List:   PT Treatment Interventions:     PT Goals Acute Rehab PT Goals PT Goal: Sit to Stand - Progress: Progressing toward goal PT Goal: Stand to Sit - Progress: Progressing toward goal PT Goal: Ambulate - Progress: Progressing toward goal  Visit Information  Last PT Received On: 12/21/11    Subjective Data      Cognition  Overall Cognitive Status: Appears within functional limits for tasks assessed/performed Arousal/Alertness: Awake/alert Orientation Level: Appears intact for tasks assessed Behavior During Session: [facility] for tasks performed    Balance     End of Session PT - End of Session Equipment Utilized During Treatment: Gait belt Activity Tolerance: Patient tolerated treatment well Patient left: in chair;with call bell/phone within reach Nurse Communication: Mobility status   GP     Wakeelah Solan 12/21/2011, 1:42 PM

## 2011-12-21 NOTE — Progress Notes (Signed)
Agreed with above 12/21/2011 Taiwana Willison Elizabeth PTA [phone] pager [phone] office    

## 2011-12-21 NOTE — Progress Notes (Signed)
Occupational Therapy Treatment Patient Details Name: [name] MRN: [id number] DOB: 03/28/1930 Today's Date: 12/21/2011 Time: 2595-6387 OT Time Calculation (min): 46 min  OT Assessment / Plan / Recommendation Comments on Treatment Session Pt has made great progress since last OT session.  Now only needs supervision for selfcare tasks and functional transfers.  Feel he will be safe to discharge home without the need for OT follow-up as long as he has initial 24 hour supervision.    Follow Up Recommendations  No OT follow up       Equipment Recommendations  None recommended by OT       Frequency Min 2X/week   Plan Discharge plan needs to be updated    Precautions / Restrictions Precautions Precautions: Fall Restrictions Weight Bearing Restrictions: No   Pertinent Vitals/Pain Pt reports chronic knee pain  In the left knee with mobility   ADL  Grooming: Performed;Supervision/safety Where Assessed - Grooming: Unsupported standing Upper Body Bathing: Performed;Supervision/safety Where Assessed - Upper Body Bathing: Unsupported sit to stand Lower Body Bathing: Performed;Supervision/safety Where Assessed - Lower Body Bathing: Unsupported sit to stand Upper Body Dressing: Simulated;Set up Where Assessed - Upper Body Dressing: Unsupported sitting Lower Body Dressing: Simulated;Supervision/safety Where Assessed - Lower Body Dressing: Supported sit to stand Toilet Transfer: Buyer, retail Method: Stand pivot;Other (comment) (with Rw.) Toilet Transfer Equipment: Raised toilet seat with arms (or 3-in-1 over toilet) Toileting - Clothing Manipulation and Hygiene: Simulated;Supervision/safety Where Assessed - Toileting Clothing Manipulation and Hygiene: Sit to stand from 3-in-1 or toilet Tub/Shower Transfer Method: Not assessed Equipment Used: Rolling walker Transfers/Ambulation Related to ADLs: Pt overall supervision for selfcare tasks and functional  transfers with use of the RW.  Pt much improved since last visit. ADL Comments: Pt overall close supervision for selfcare tasks.  Able to mainatin standing for 20 mins without rest break during mobility, bathing, and dressing.        OT Goals ADL Goals ADL Goal: Grooming - Progress: Progressing toward goals ADL Goal: Lower Body Bathing - Progress: Progressing toward goals ADL Goal: Lower Body Dressing - Progress: Progressing toward goals ADL Goal: Toilet Transfer - Progress: Progressing toward goals  Visit Information  Last OT Received On: 12/21/11 Assistance Needed: +1    Subjective Data  Subjective: "My daughter has a seat for the shower but I didn't need to use it before" Patient Stated Goal: Pt wants to go home tomorrow hopefully.      Cognition  Overall Cognitive Status: Appears within functional limits for tasks assessed/performed Orientation Level: Appears intact for tasks assessed Behavior During Session: [facility] for tasks performed Following Commands: Follows multi-step commands consistently    Mobility Transfers Sit to Stand: 5: Supervision;With upper extremity assist;With armrests;From chair/3-in-1 Stand to Sit: 4: Min guard;Without upper extremity assist;With armrests;To chair/3-in-1 Details for Transfer Assistance: Pt collapsed back into the chair with transition from standing to sitting.         End of Session OT - End of Session Activity Tolerance: Patient tolerated treatment well Patient left: in chair;with call bell/phone within reach     [facility] OTR/L 12/21/2011, 3:40 PM Pager number [phone]

## 2011-12-22 MED ORDER — LEVETIRACETAM 500 MG PO TABS: 500.0000 mg | ORAL_TABLET | Freq: Two times a day (BID) | ORAL | Status: DC

## 2011-12-22 NOTE — Discharge Summary (Signed)
Physician Discharge Summary  Patient ID: DASHON MCINTIRE MRN: [id number] DOB/AGE: 76-Jan-1931 76 y.o.  Admit date: 12/14/2011 Discharge date: 12/22/2011  Admission Diagnoses:Left subacute/chronic subdural hematoma   Discharge Diagnoses: Left subacute/chronic subdural hematoma  Active Problems:  * No active hospital problems. *    Discharged Condition: good  Hospital Course: pt admitted day of surgery - had procedure below - watched in ICU then Transferred top floor - had some confusion for couple dayws  - but has improved - worked with therapies - up ambulating - doing well  Consults: None    Treatments: surgery:Left craniotomy for evacuation of subdural hematoma   Discharge Exam: Blood pressure 145/80, pulse 69, temperature 98.2 F (36.8 C), temperature source Oral, resp. rate 18, height 5\' 9"  (1.753 m), weight 85.7 kg (188 lb 15 oz), SpO2 94.00%. Wound:c/d/i  Disposition: home  Discharge Orders    Future Appointments: Provider: Department: Dept Phone: Center:   01/07/2012 11:15 AM [name], MD [facility] [phone] None     Medication List  As of 12/22/2011  8:33 AM   TAKE these medications         atorvastatin 40 MG tablet   Commonly known as: LIPITOR   Take 40 mg by mouth daily.      digoxin 0.25 MG tablet   Commonly known as: LANOXIN   Take 125 mcg by mouth daily.      folic acid 400 MCG tablet   Commonly known as: FOLVITE   Take 400 mcg by mouth daily.      levETIRAcetam 500 MG tablet   Commonly known as: KEPPRA   Take 1 tablet (500 mg total) by mouth 2 (two) times daily.      metoprolol succinate 25 MG 24 hr tablet   Commonly known as: TOPROL-XL   Take 25 mg by mouth daily.      multivitamin with minerals Tabs   Take 1 tablet by mouth daily.      pantoprazole 40 MG tablet   Commonly known as: PROTONIX   Take 40 mg by mouth daily.      ramipril 10 MG capsule   Commonly known as: ALTACE   Take 10 mg by mouth daily.     zolpidem 10 MG tablet   Commonly known as: AMBIEN   Take 10 mg by mouth at bedtime as needed. For sleep           f/u with dr Lovell Sheehan 1 week for staple removal  Signed: Clydene Fake, MD 12/22/2011, 8:33 AM

## 2011-12-22 NOTE — Care Management Note (Signed)
    Page 1 of 1   12/22/2011     11:41:51 AM   CARE MANAGEMENT NOTE 12/22/2011  Patient:  MAVIS, FICHERA   Account Number:  [id number]  Date Initiated:  12/14/2011  Documentation initiated by:  Carlyle Lipa  Subjective/Objective Assessment:   elective crani for chronic subdural hematoma     Action/Plan:   home when stable postop  PT eval-recommending HHPT   Anticipated DC Date:  12/22/2011   Anticipated DC Plan:  HOME W HOME HEALTH SERVICES      DC Planning Services  CM consult      Choice offered to / List presented to:  C-1 Patient        HH arranged  HH-2 PT      [facility] agency  [facility]   Status of service:  Completed, signed off Medicare Important Message given?   (If response is "NO", the following Medicare IM given date fields will be blank) Date Medicare IM given:   Date Additional Medicare IM given:    Discharge Disposition:  HOME W HOME HEALTH SERVICES  Per UR Regulation:  Reviewed for med. necessity/level of care/duration of stay  If discussed at Long Length of Stay Meetings, dates discussed:    Comments:  12/22/11 Spoke with patient about HHC. He has had HHC with [facility] in past and wants to work with them again. Contacted Debbie at [facility] and requested HHPT. Patient states that he has a rolling walker at home. His daughter lives with him and is with him most of the time. Jacquelynn Cree RN, BSN, CCM

## 2011-12-22 NOTE — Evaluation (Signed)
Speech Language Pathology Evaluation Patient Details Name: [name] MRN: [id number] DOB: 01-03-1930 Today's Date: 12/22/2011 Time: 4098-1191 SLP Time Calculation (min): 20 min  Problem List:  Patient Active Problem List  Diagnosis  . Primary mucoepidermoid carcinoma of parotid gland  . Bladder cancer  . Elevated PSA  . Pacemaker  . Diabetes mellitus  . Hypertension  . Dyslipidemia  . Cancer  . Subdural hematoma  . ETOH abuse  . Campath-induced atrial fibrillation  . UTI (lower urinary tract infection)  . TIA (transient ischemic attack)  . Acute pulmonary edema  . Alcohol withdrawal delirium   Past Medical History:  Past Medical History  Diagnosis Date  . Atrial fibrillation 1990  . Dyslipidemia   . Prostate hypertrophy   . Arthritis     degenerative-knees  . History of radiation therapy 05/05/07-06/15/07    right parotid through subclavicular region/ mid jugularlymph node chain  . Allergy   . Hypertension     labile  . Heart murmur     2/6 systolic  . History of chemotherapy      Hx carboplatin/76fu  . Xerostomia     limited  . Pacemaker     2003  . Diabetes mellitus     Not on any medications  . Stroke 2003    TIA  . Sleep apnea     Not diagnosied  . Bladder cancer 2010  . 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 HOH (hard of hearing)    Past Surgical History:  Past Surgical History  Procedure Date  . Pacemaker insertion 1990    last changed 2003  . Cystoscopy 01/2011    neg  . Back surgery   . Parotidectomy   . Craniotomy 12/14/2011    Procedure: CRANIOTOMY HEMATOMA EVACUATION SUBDURAL;  Surgeon: Cristi Loron, MD;  Location: [facility];  Service: Neurosurgery;  Laterality: Left;  LEFT Craniotomy for subdural    HPI:  78 year old white admitted 12/14/11 with a left chronic subdural hematoma, that has progressively enlarged on serial CT scans, s/p  left craniotomy for evacuation of the subdural hematoma on 12/14/11.   Assessment / Plan /  Recommendation Clinical Impression  Demonstrates overall functional cognitive-communicative abilities for both basic and complex information.  Patient reports that his daughter would "check on" his abilities to independently manage medications and money PTA due to chronic SDH and h/o cognitive changes with recovery.  Advise patient and his daughter to continue these checks & balances type system to promote his independence and accuracy.    SLP Assessment  Patient does not need any further Speech Lanaguage Pathology Services    Follow Up Recommendations  None    Pertinent Vitals/Pain n/a    SLP Evaluation Prior Functioning  Cognitive/Linguistic Baseline: Within functional limits (Patient reports, "my daughter would check on me.") Type of Home: House Lives With: Daughter Available Help at Discharge: Family;Available 24 hours/day Vocation: Retired   IT consultant  Overall Cognitive Status: Appears within functional limits for tasks assessed Arousal/Alertness: Awake/alert Orientation Level: Oriented X4 Attention: Alternating Alternating Attention: Appears intact Memory: Appears intact Awareness: Appears intact Problem Solving: Appears intact Safety/Judgment: Appears intact    Comprehension  Auditory Comprehension Overall Auditory Comprehension: Appears within functional limits for tasks assessed Visual Recognition/Discrimination Discrimination: Within Function Limits Reading Comprehension Reading Status: Within funtional limits    Expression Expression Primary Mode of Expression: Verbal Verbal Expression Overall Verbal Expression: Appears within functional limits for tasks assessed Written Expression Dominant Hand: Right Written  Expression: Within Functional Limits   Oral / Motor Oral Motor/Sensory Function Overall Oral Motor/Sensory Function: Impaired Labial ROM: Reduced right Labial Symmetry: Abnormal symmetry right Labial Strength: Within Functional Limits Labial  Sensation: Within Functional Limits Lingual ROM: Within Functional Limits Lingual Symmetry: Within Functional Limits Lingual Strength: Within Functional Limits Lingual Sensation: Within Functional Limits Facial ROM: Reduced right Facial Symmetry: Right droop (subtle/mild) Facial Strength: Within Functional Limits Facial Sensation: Within Functional Limits Velum: Within Functional Limits Mandible: Within Functional Limits Motor Speech Overall Motor Speech: Appears within functional limits for tasks assessed     Myra Rude, M.S.,CCC-SLP Pager [phone] 12/22/2011, 10:58 AM

## 2011-12-22 NOTE — Progress Notes (Signed)
Occupational Therapy Treatment Patient Details Name: [name] MRN: [id number] DOB: [birth year] Today's Date: 12/22/2011 Time: 0802-0822 OT Time Calculation (min): 20 min  OT Assessment / Plan / Recommendation Comments on Treatment Session Pt continues to progress well     Follow Up Recommendations  No OT follow up (recommend at least home safety eval)    Equipment Recommendations  None recommended by OT    Precautions / Restrictions Precautions Precautions: Fall Restrictions Weight Bearing Restrictions: No   Pertinent Vitals/Pain Pt with no c/o pain during session    ADL  Grooming: Performed;Wash/dry face;Wash/dry hands;Teeth care;Supervision/safety Where Assessed - Grooming: Unsupported standing Toilet Transfer: Performed;Supervision/safety Toilet Transfer Method:  (stand over toilet) Acupuncturist: Regular height toilet Toileting - Clothing Manipulation and Hygiene: Simulated;Supervision/safety Where Assessed - Engineer, mining and Hygiene: Standing Tub/Shower Transfer: Performed;Minimal assistance Tub/Shower Transfer Method: Ambulating (side entry/exit) Equipment Used: Rolling walker;Gait belt Transfers/Ambulation Related to ADLs: Continues to be at supervision level- progressing towards Mod I ADL Comments: Progressing towards Mod I with ADL activity However, pt unsafe with tub/shower transfer. Pt may do better with this in his home environment which he is used to, but still recommend supervision for initial tub transfer.    OT Diagnosis:    OT Problem List:   OT Treatment Interventions:     OT Goals ADL Goals ADL Goal: Grooming - Progress: Progressing toward goals ADL Goal: Lower Body Dressing - Progress: Progressing toward goals ADL Goal: Toilet Transfer - Progress: Progressing toward goals Pt Will Perform Tub/Shower Transfer: Tub transfer;with modified independence;Ambulation;Shower seat with back ADL Goal: Tub/Shower Transfer -  Progress: Goal set today  Visit Information  Last OT Received On: 12/22/11 Assistance Needed: +1    Subjective Data      Prior Functioning       Cognition  Overall Cognitive Status: Appears within functional limits for tasks assessed/performed Behavior During Session: [facility] for tasks performed    Mobility Transfers Sit to Stand: 5: Supervision;From bed Stand to Sit: 5: Supervision;To chair/3-in-1 Details for Transfer Assistance: VC to bring RW with as he squared up with chair and to control descent   Exercises    Balance    End of Session OT - End of Session Equipment Utilized During Treatment: Gait belt Activity Tolerance: Patient tolerated treatment well Patient left: in chair;with call bell/phone within reach  GO     [name] 12/22/2011, 9:40 AM

## 2011-12-22 NOTE — Progress Notes (Signed)
Agree with progress note.  Opha Mcghee, PT DPT [phone]  

## 2011-12-22 NOTE — Progress Notes (Signed)
Physical Therapy Treatment Patient Details Name: [name] MRN: [id number] DOB: 1930/05/18 Today's Date: 12/22/2011 Time: 4098-1191 PT Time Calculation (min): 36 min  PT Assessment / Plan / Recommendation Comments on Treatment Session  Patient continues to make good improvements with mobility and stair. Very eager to return home with daughters help    Follow Up Recommendations  Home health PT    Barriers to Discharge        Equipment Recommendations  Rolling walker with 5" wheels    Recommendations for Other Services    Frequency Min 4X/week   Plan Discharge plan remains appropriate;Frequency remains appropriate    Precautions / Restrictions Precautions Precautions: Fall   Pertinent Vitals/Pain     Mobility  Bed Mobility Supine to Sit: 6: Modified independent (Device/Increase time) Sitting - Scoot to Edge of Bed: 6: Modified independent (Device/Increase time) Transfers Sit to Stand: 4: Min guard;With upper extremity assist;From bed Stand to Sit: 4: Min guard;With upper extremity assist;To chair/3-in-1 Details for Transfer Assistance: Cues for safe technique Ambulation/Gait Ambulation/Gait Assistance: 4: Min guard Ambulation Distance (Feet): 225 Feet Assistive device: Rolling walker Ambulation/Gait Assistance Details: Cues for safety with RW and posture Gait Pattern: Decreased stride length;Trunk flexed;Step-through pattern Stairs: Yes Stairs Assistance: 4: Min guard Stair Management Technique: Two rails Number of Stairs: 10     Exercises     PT Diagnosis:    PT Problem List:   PT Treatment Interventions:     PT Goals Acute Rehab PT Goals PT Goal: Supine/Side to Sit - Progress: Met PT Goal: Sit to Stand - Progress: Progressing toward goal PT Goal: Stand to Sit - Progress: Progressing toward goal PT Transfer Goal: Bed to Chair/Chair to Bed - Progress: Progressing toward goal PT Goal: Ambulate - Progress: Progressing toward goal Pt will Go Up / Down  Stairs: 3-5 stairs;with supervision;with least restrictive assistive device PT Goal: Up/Down Stairs - Progress: Goal set today  Visit Information  Last PT Received On: 12/22/11 Assistance Needed: +1    Subjective Data      Cognition  Overall Cognitive Status: Appears within functional limits for tasks assessed/performed Arousal/Alertness: Awake/alert Orientation Level: Appears intact for tasks assessed Behavior During Session: [facility] for tasks performed    Balance     End of Session PT - End of Session Equipment Utilized During Treatment: Gait belt Activity Tolerance: Patient tolerated treatment well Patient left: in chair Nurse Communication: Mobility status   GP     Fredrich Birks 12/22/2011, 12:15 PM 12/22/2011 Fredrich Birks PTA [phone] pager [phone] office

## 2011-12-24 ENCOUNTER — Ambulatory Visit: Payer: Medicare Other | Admitting: Radiation Oncology

## 2011-12-30 ENCOUNTER — Other Ambulatory Visit: Payer: Self-pay | Admitting: Neurosurgery

## 2011-12-30 DIAGNOSIS — S065X9A Traumatic subdural hemorrhage with loss of consciousness of unspecified duration, initial encounter: Secondary | ICD-10-CM

## 2011-12-31 ENCOUNTER — Ambulatory Visit: Payer: Medicare Other | Admitting: Radiation Oncology

## 2012-01-06 ENCOUNTER — Encounter: Payer: Self-pay | Admitting: Radiation Oncology

## 2012-01-07 ENCOUNTER — Ambulatory Visit
Admission: RE | Admit: 2012-01-07 | Discharge: 2012-01-07 | Disposition: A | Discharge status: Home / Self Care | Payer: Medicare Other | Source: Ambulatory Visit | Attending: Radiation Oncology | Admitting: Radiation Oncology

## 2012-01-07 VITALS — BP 144/80 | HR 69 | Temp 97.3°F | Wt 175.5 lb

## 2012-01-07 DIAGNOSIS — IMO0002 Reserved for concepts with insufficient information to code with codable children: Secondary | ICD-10-CM

## 2012-01-07 DIAGNOSIS — C07 Malignant neoplasm of parotid gland: Secondary | ICD-10-CM

## 2012-01-07 DIAGNOSIS — T66XXXA Radiation sickness, unspecified, initial encounter: Secondary | ICD-10-CM

## 2012-01-07 DIAGNOSIS — R5383 Other fatigue: Secondary | ICD-10-CM

## 2012-01-07 NOTE — Progress Notes (Signed)
Quick Note:  Please call patient with normal result.  Thanks. MM ______ 

## 2012-01-07 NOTE — Progress Notes (Signed)
Routine follow up post right parotid radiation.Doing well from radiation standpoint but did under go left frontal subdural hematoma drainage by Dr.Jenkis. Scheduled for ct of head on 01/12/12 and follow up with Dr.Jenkins. New medications:keprra

## 2012-01-07 NOTE — Progress Notes (Signed)
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  Interval Since Last Radiation:  4.5 years  Narrative:  The patient returns today for routine follow-up.  He has had an eventful past few months. He developed a intracranial hematoma which was treated surgically by Dr. Lovell Sheehan. He will follow up with Dr. Lovell Sheehan in July 23 for head CT. He had a chest x-ray during his hospitalization April 15. He does not have seem to have a recent TSH.                              ALLERGIES:  is allergic to codeine and docetaxel.  Meds: Current Outpatient Prescriptions  Medication Sig Dispense Refill  . atorvastatin (LIPITOR) 40 MG tablet Take 40 mg by mouth daily.       . digoxin (LANOXIN) 0.25 MG tablet Take 125 mcg by mouth daily.      . folic acid (FOLVITE) 400 MCG tablet Take 400 mcg by mouth daily.      Marland Kitchen levETIRAcetam (KEPPRA) 500 MG tablet Take 1 tablet (500 mg total) by mouth 2 (two) times daily.  30 tablet  1  . metoprolol succinate (TOPROL-XL) 25 MG 24 hr tablet Take 25 mg by mouth daily.      . Multiple Vitamin (MULITIVITAMIN WITH MINERALS) TABS Take 1 tablet by mouth daily.      . pantoprazole (PROTONIX) 40 MG tablet Take 40 mg by mouth daily.       . ramipril (ALTACE) 10 MG capsule Take 10 mg by mouth daily.      Marland Kitchen zolpidem (AMBIEN) 10 MG tablet Take 10 mg by mouth at bedtime as needed. For sleep        Physical Findings: The patient is in no acute distress. Patient is alert and oriented.  weight is 175 lb 8 oz (79.606 kg). His temperature is 97.3 F (36.3 C). His blood pressure is 144/80 and his pulse is 69. . The patient's parotid  regions are carefully palpated bilaterally and there is no nodularity or abnormality noted. There is no appreciable lymphadenopathy in the facial nodes or neck or supraclavicular region. Cranial nerves are grossly intact. Oral cavity contains good dentition with no significant mucosal records. Within the right tonsil, there is some central nodularity measuring up to 1 cm in size with no overlying mucosal change suggestive of follicular hypertrophy. The remainder of the posterior oropharynx and larynx appear unremarkable on indirect meter or exam.  No significant changes.  Lab Findings: Lab Results  Component Value Date   WBC 7.7 12/15/2011   HGB 11.4* 12/15/2011   HCT 33.2* 12/15/2011   MCV 97.1 12/15/2011   PLT 138* 12/15/2011    Radiographic Findings: Dg Skull 1-3 Views  12/14/2011  *RADIOLOGY REPORT*  Clinical Data: Left craniotomy for hematoma.  Lost needle.  SKULL - 1-3 VIEW  Comparison: 12/07/2011  Findings: There is an extra screw that is visualized along the posterior aspect of the craniotomy defect. On this single lateral projection, this projects chest inferior to the posterior craniotomy fixation hardware.  No additional unexpected radiopaque foreign bodies.  IMPRESSION: Small extra screw along the posterior aspect of the craniotomy defect.  These results were called to the OR and Dr. Lovell Sheehan at the time of interpretation.  Original Report Authenticated By: Cyndie Chime, M.D.   Ct Head Wo Contrast  12/17/2011  *RADIOLOGY REPORT*  Clinical Data: Slurred speech.  Altered mental status.  CT HEAD WITHOUT CONTRAST  Technique:  Contiguous axial images were obtained from the base of the skull through the vertex without contrast.  Comparison: Head CT 12/17/2011.  Findings: There is again a complex left sided subdural collection of gas and fluid.  This measures up to 18 mm in width (unchanged). The fluid has mixed attenuation with areas of low attenuation and high attenuation, suggesting blood  products in different phases of degradation.  There is also a small right-sided subdural fluid collection that contains heterogeneous attenuation, which also likely represents a mixed phase subdural hematoma.  This too is unchanged in thickness (up to 12 mm).  Both of these collections exerts some mass effect on the underlying brain parenchyma, which is unchanged.  There is an area of low attenuation in the left frontal lobe white matter, compatible with some encephalomalacia (unchanged).  No definite acute intraparenchymal hemorrhage, no intra ventricular hemorrhage, and it no evidence of hydrocephalous. Mild cerebral and cerebellar atrophy are again noted. Postoperative changes of the left frontal craniotomy are again noted.  Visualized paranasal sinuses and mastoids are generally well pneumatized, with exception of a right mastoid effusion.  IMPRESSION: 1.  Overall, the appearance the brain is unchanged compared to the CT scan from earlier today, as detailed above. 2.  Right mastoid effusion is unchanged.  Original Report Authenticated By: Florencia Reasons, M.D.   Ct Head Wo Contrast  12/17/2011  *RADIOLOGY REPORT*  Clinical Data: Craniotomy for subdural hematoma  CT HEAD WITHOUT CONTRAST  Technique:  Contiguous axial images were obtained from the base of the skull through the vertex without contrast.  Comparison: CT 12/14/2011  Findings: Left frontal craniotomy for subdural drainage. Pneumocephalus on the left has improved, now measuring 18 mm compared with 22 mm.  Mixed density subdural hematoma on the left is unchanged from the  prior CT.  Mixed density subdural hematoma over the convexity on the right measures 12 mm.  It is slightly smaller and lower density compared with the prior CT.  Ventricle size is normal.  2 mm midline shift to the right is unchanged.  No acute infarct.  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  No mass lesion.  IMPRESSION: Pneumocephalus on the left has improved.  There  remains a small mixed density subdural hematoma on the left.  Mixed density subdural hematoma on the right shows interval improvement.  Original Report Authenticated By: Camelia Phenes, M.D.   Ct Head Wo Contrast  12/14/2011  *RADIOLOGY REPORT*  Clinical Data: Postop subdural evacuation  CT HEAD WITHOUT CONTRAST  Technique:  Contiguous axial images were obtained from the base of the skull through the vertex without contrast.  Comparison: 12/07/2011  Findings: The patient is status post left frontal craniotomy for subdural evacuation.    A subdural drain is in place. The subdural hematoma appears improved compared with the preoperative appearance on 12/07/11, although moderate left frontal extra-axial pneumocephalus does exert slight mass effect on the left frontal lobe.  There is a mixed attenuation right subdural hematoma which appears slightly increased from priors.  Maximal thickness as measured on image 28 is 19 mm; the more dependent component  of this right subdural appears increasingly hyperdense, suggesting recent hemorrhage, as seen on image 28, measuring 10 mm thick.   No intra-axial hematoma or stroke.  Moderate atrophy with chronic microvascular ischemic change.   Post craniotomy changes noted left frontal region. A small screw projects below the more posterior titanium plate (image 24, series 3) within the scalp.  IMPRESSION: Status post left frontal craniotomy for subdural evacuation, overall improved. Moderate left frontal pneumocephalus does exert mild mass effect on the left frontal lobe.  Slight worsening right subdural hematoma.  Original Report Authenticated By: Elsie Stain, M.D.    Impression:  The patient currently exhibits no evidence of recurrence. Does have some low-level xerostomia following radiation. He is due for TSH screening for postradiation hypothyroidism  Plan:  The patient will return to radiation oncology clinic for routine followup in 6 months.    _____________________________________  Artist Pais. Kathrynn Running, M.D.

## 2012-01-12 ENCOUNTER — Ambulatory Visit
Admission: RE | Admit: 2012-01-12 | Discharge: 2012-01-12 | Disposition: A | Discharge status: Home / Self Care | Payer: Medicare Other | Source: Ambulatory Visit | Attending: Neurosurgery | Admitting: Neurosurgery

## 2012-01-12 ENCOUNTER — Other Ambulatory Visit: Payer: Medicare Other

## 2012-01-12 DIAGNOSIS — S065X9A Traumatic subdural hemorrhage with loss of consciousness of unspecified duration, initial encounter: Secondary | ICD-10-CM

## 2012-02-15 ENCOUNTER — Other Ambulatory Visit: Payer: Self-pay | Admitting: Urology

## 2012-03-29 ENCOUNTER — Encounter (HOSPITAL_BASED_OUTPATIENT_CLINIC_OR_DEPARTMENT_OTHER): Payer: Self-pay | Admitting: *Deleted

## 2012-03-29 NOTE — Progress Notes (Signed)
Pt instructed npo p mn 10/10 x digoxin, keppra, toprol w sip of water.  To [facility] 10/11 @ 0845.  Needs istat on arrival.

## 2012-03-29 NOTE — Progress Notes (Addendum)
Requested last office visit notes and cardiac testing from Dr. Kandis Cocking office  REVIEWED CHART W/ DR ROSE MDA, INCLUDED OFFICE NOTE, OK TO PROCEED.

## 2012-03-31 NOTE — H&P (Signed)
History of Present Illness          CIS of the bladder: After an episode of gross hematuria cystoscopy was performed and revealed a bladder tumor. 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  On 06/18/08 he underwent re-resection of the prostate and bladder Bx which showed no evidence of malignancy in any of the prostate specimen but was pos. for high grade CIS in the bladder (left wall).  He underwent a 6 week course of intravesical BCG therapy which he completed in 4/10.  On 11/02/08 repeat cystoscopy with random bladder biopsies revealed no evidence of CIS or papillary tumor.  He was then found to have a positive cytology despite negative cystoscopy in 6/12. He therefore underwent barbotage of both right and left renal pelvis as well as the bladder including retrograde pyelography. No abnormality was noted of the upper tracts on retrograde however there were atypical cells noted from the barbotage of the left renal pelvis and CIS was again identified on the posterior wall of the bladder at the time of biopsy. He therefore underwent a second 6 week induction course of BCG.  He has a history of an elevated PSA and has been biopsied twice because of that.  In 09/1994, his PSA was 7.4.  Biopsy revealed BPH and chronic prostatitis.  I followed it along and then biopsied him again in 2000 and that showed BPH with chronic prostatitis only.  His PSA has fluctuated over the years, but remained relatively stable.     Organic erectile dysfunction: He wanted to try a vacuum erection device. He has received information about this device and asked if I thought it might be helpful for him so I gave him a prescription for the device.    Interval history: He reports he did not followup for his routine surveillance cystoscopy because he had to undergo a craniotomy  for a left subdural hematoma. He then had passage of 2 small clots and was experiencing some suprapubic pressure and thought he might have had a UTI.   Past Medical History Problems  1. History of  Acute Cystitis 595.0 2. History of  Acute Urinary Retention 788.20 3. History of  Arthritis V13.4 4. History of  Atrial Fibrillation 427.31 5. History of  Benign Prostatic Hypertrophy With Urinary Obstruction 600.01 6. Carcinoma In Situ Of The Bladder 233.7 7. History of  Cardiac Devices Pacemaker Present V45.01 8. 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 History of  Diabetes Mellitus 250.00 10. History of  Gross Hematuria 599.71 11. History of  Hypercholesterolemia 272.0 12. History of  Hypertension 401.9 13. History of  Ischemic Stroke 436 14. History of  Sick Sinus Syndrome 427.81 15. History of  Skin Cancer V10.83 16. History of  Taking High-risk Medication V58.69 17. 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 History of  Visit For: Exam Following High-risk Medication V67.51  Surgical History Problems  1. History of  Biopsy Of The Prostate Needle 2. History of  Biopsy Of The Prostate Needle 3. History of  Craniotomy Supratentorial Subdural Hemorrhage Removal 4. History of  Cystoscopy With Biopsy 5. History of  Cystoscopy With Biopsy 6. History of  Cystoscopy With Fulguration Minor Lesion (Under 5mm) 7. History of  Cystoscopy With Fulguration Small Lesion (5-39mm) 8. History of  Surgery Excision Of Parotid Tumor/Gland 9.  History of  Transurethral Incision Of Prostate 10. History of  Transurethral Resection Of Prostate (TURP)  Current Meds 1. Altace 10 MG TABS; Therapy: (Recorded:17Nov2008) to 2. Ambien 10 MG Oral Tablet; Therapy: (Recorded:15Nov2007) to 3. Aspirin 81 MG Oral Tablet; Therapy: (Recorded:15Nov2007) to 4. Digoxin 0.25 MG Oral Tablet; Therapy: (Recorded:01Mar2012) to 5. Furosemide 20 MG Oral Tablet; Therapy: 29May2012 to 6.  Hydrocodone-Acetaminophen 7.5-500 MG Oral Tablet; Therapy: 23Jan2013 to 7. Klor-Con M10 10 MEQ Oral Tablet Extended Release; Therapy: 02Jul2012 to 8. Lipitor 40 MG Oral Tablet; Therapy: (Recorded:15Nov2007) to 9. Mens One Daily TABS; Therapy: (Recorded:15Nov2007) to 10. Plavix 75 MG Oral Tablet; Therapy: (Recorded:15Nov2007) to 11. Protonix 40 MG Oral Tablet Delayed Release; Therapy: (Recorded:15Nov2007) to 12. Toprol XL 25 MG Oral Tablet Extended Release 24 Hour; Therapy: (Recorded:31May2011) to 13. Triamterene-HCTZ 37.5-25 MG Oral Capsule; Therapy: 13Aug2012 to  Allergies Medication  1. Codeine Derivatives 2. Doxy-Caps CAPS  Family History Problems  1. Family history of  Family Health Status - Father's Age 87-lung cancer 2. Family history of  Family Health Status - Mother's Age 29-stroke 3. Family history of  Family Health Status Number Of Children 1 son, 2 daughters  Social History Problems  1. Alcohol Use 4 drinks/day 2. Caffeine Use 1/day 3. Family history of  Death In The Family Father deceased age 25 4. Family history of  Death In The Family Mother deceased age 76 5. Marital History - Widowed 6. Never A Smoker 7. Occupation: retired Denied  8. History of  Tobacco Use V15.82  Review of Systems Genitourinary, constitutional and gastrointestinal system(s) were reviewed and pertinent findings if present are noted.  Genitourinary: erectile dysfunction, but no hematuria.  Gastrointestinal: no flank pain.  Constitutional: no fever.    Vitals Vital Signs  BMI Calculated: 25.2 BSA Calculated: 1.98 Weight: 176 lb  Blood Pressure: 132 / 46 Temperature: 97.6 F Heart Rate: 68  Physical Exam: General appearance: alert and appears stated age Head: Normocephalic, without obvious abnormality, atraumatic Eyes: conjunctivae/corneas clear. EOM's intact.  Oropharynx: moist mucous membranes Neck: supple, symmetrical, trachea midline Resp: normal respiratory  effort Cardio: regular rate and rhythm Back: symmetric, no curvature. ROM normal. No CVA tenderness. GI: soft, non-tender; bowel sounds normal; no masses,  no organomegaly Male genitalia: penis: normal male phallus, uncirc. with no lesions or discharge. Testes: bilaterally descended with no masses or tenderness. no hernias Extremities: extremities normal, atraumatic, no cyanosis or edema Skin: Skin color normal. No visible rashes or lesions Neurologic: Grossly normal  Assessment Assessed  1. Carcinoma In Situ Of The Bladder 233.7 2. History of  Transitional Cell Carcinoma Of The Bladder V10.51 3. Gross Hematuria 599.71   He had some edema of the bladder neck region mainly on the right-hand side but no papillary tumors. The prostatic urethra looked good with no evidence of lesion or active bleeding.   His urine cytology returned showing the presence of malignant urothelial cells. I therefore discussed the need for cystoscopy with bladder biopsies under anesthesia. We went over the procedure in detail including its risks and complications, limitations and alternatives as well as probabilities of success. He understands and has elected to proceed.  Plan: Outpatient cystoscopy with bladder biopsies.

## 2012-04-01 ENCOUNTER — Encounter (HOSPITAL_BASED_OUTPATIENT_CLINIC_OR_DEPARTMENT_OTHER): Payer: Self-pay | Admitting: Anesthesiology

## 2012-04-01 ENCOUNTER — Ambulatory Visit (HOSPITAL_BASED_OUTPATIENT_CLINIC_OR_DEPARTMENT_OTHER): Payer: Medicare Other | Admitting: Anesthesiology

## 2012-04-01 ENCOUNTER — Encounter (HOSPITAL_BASED_OUTPATIENT_CLINIC_OR_DEPARTMENT_OTHER)
Admission: RE | Disposition: A | Discharge status: Home / Self Care | Payer: Self-pay | Source: Ambulatory Visit | Attending: Urology

## 2012-04-01 ENCOUNTER — Encounter (HOSPITAL_BASED_OUTPATIENT_CLINIC_OR_DEPARTMENT_OTHER): Payer: Self-pay | Admitting: *Deleted

## 2012-04-01 ENCOUNTER — Ambulatory Visit (HOSPITAL_BASED_OUTPATIENT_CLINIC_OR_DEPARTMENT_OTHER)
Admission: RE | Admit: 2012-04-01 | Discharge: 2012-04-01 | Disposition: A | Discharge status: Home / Self Care | Payer: Medicare Other | Source: Ambulatory Visit | Attending: Urology | Admitting: Urology

## 2012-04-01 DIAGNOSIS — Z950 Presence of cardiac pacemaker: Secondary | ICD-10-CM

## 2012-04-01 DIAGNOSIS — Z79899 Other long term (current) drug therapy: Secondary | ICD-10-CM | POA: Insufficient documentation

## 2012-04-01 DIAGNOSIS — N3289 Other specified disorders of bladder: Secondary | ICD-10-CM | POA: Insufficient documentation

## 2012-04-01 DIAGNOSIS — I10 Essential (primary) hypertension: Secondary | ICD-10-CM | POA: Diagnosis present

## 2012-04-01 DIAGNOSIS — N3030 Trigonitis without hematuria: Secondary | ICD-10-CM | POA: Insufficient documentation

## 2012-04-01 DIAGNOSIS — Z7902 Long term (current) use of antithrombotics/antiplatelets: Secondary | ICD-10-CM | POA: Insufficient documentation

## 2012-04-01 DIAGNOSIS — J189 Pneumonia, unspecified organism: Secondary | ICD-10-CM | POA: Diagnosis present

## 2012-04-01 DIAGNOSIS — I679 Cerebrovascular disease, unspecified: Secondary | ICD-10-CM | POA: Diagnosis present

## 2012-04-01 DIAGNOSIS — N401 Enlarged prostate with lower urinary tract symptoms: Secondary | ICD-10-CM | POA: Insufficient documentation

## 2012-04-01 DIAGNOSIS — D0910 Carcinoma in situ of unspecified urinary organ: Secondary | ICD-10-CM | POA: Insufficient documentation

## 2012-04-01 DIAGNOSIS — E7800 Pure hypercholesterolemia, unspecified: Secondary | ICD-10-CM | POA: Insufficient documentation

## 2012-04-01 DIAGNOSIS — C07 Malignant neoplasm of parotid gland: Secondary | ICD-10-CM | POA: Diagnosis present

## 2012-04-01 DIAGNOSIS — I4891 Unspecified atrial fibrillation: Secondary | ICD-10-CM | POA: Insufficient documentation

## 2012-04-01 DIAGNOSIS — E119 Type 2 diabetes mellitus without complications: Secondary | ICD-10-CM | POA: Diagnosis present

## 2012-04-01 DIAGNOSIS — Z8673 Personal history of transient ischemic attack (TIA), and cerebral infarction without residual deficits: Secondary | ICD-10-CM | POA: Insufficient documentation

## 2012-04-01 DIAGNOSIS — N138 Other obstructive and reflux uropathy: Secondary | ICD-10-CM | POA: Insufficient documentation

## 2012-04-01 DIAGNOSIS — J690 Pneumonitis due to inhalation of food and vomit: Principal | ICD-10-CM | POA: Diagnosis present

## 2012-04-01 DIAGNOSIS — G4700 Insomnia, unspecified: Secondary | ICD-10-CM | POA: Diagnosis present

## 2012-04-01 DIAGNOSIS — C679 Malignant neoplasm of bladder, unspecified: Secondary | ICD-10-CM | POA: Diagnosis present

## 2012-04-01 DIAGNOSIS — E785 Hyperlipidemia, unspecified: Secondary | ICD-10-CM | POA: Diagnosis present

## 2012-04-01 HISTORY — PX: CYSTOSCOPY WITH BIOPSY: SHX5122

## 2012-04-01 HISTORY — DX: Type 2 diabetes mellitus without complications: E11.9

## 2012-04-01 HISTORY — PX: URETEROSCOPY: SHX842

## 2012-04-01 HISTORY — PX: CYSTOSCOPY W/ RETROGRADES: SHX1426

## 2012-04-01 HISTORY — DX: Chronic kidney disease, unspecified: N18.9

## 2012-04-01 HISTORY — DX: Dry eye syndrome of bilateral lacrimal glands: H04.123

## 2012-04-01 LAB — POCT I-STAT 4, (NA,K, GLUC, HGB,HCT)
Glucose, Bld: 102 mg/dL — ABNORMAL HIGH (ref 70–99)
HCT: 47 % (ref 39.0–52.0)
Hemoglobin: 16 g/dL (ref 13.0–17.0)
Potassium: 4.2 mEq/L (ref 3.5–5.1)
Sodium: 140 mEq/L (ref 135–145)

## 2012-04-01 SURGERY — CYSTOSCOPY, WITH BIOPSY
Anesthesia: General | Site: Ureter | Laterality: Right | Wound class: Clean Contaminated

## 2012-04-01 MED ORDER — PROMETHAZINE HCL 25 MG/ML IJ SOLN: 6.2500 mg | INTRAMUSCULAR | Status: DC | PRN

## 2012-04-01 MED ORDER — IOHEXOL 350 MG/ML SOLN
INTRAVENOUS | Status: DC | PRN
  Administered 2012-04-01: 50 mL

## 2012-04-01 MED ORDER — HYDROCODONE-ACETAMINOPHEN 7.5-325 MG PO TABS
1.0000 | ORAL_TABLET | Freq: Four times a day (QID) | ORAL | Status: DC | PRN
  Administered 2012-04-01: 1 via ORAL

## 2012-04-01 MED ORDER — PHENAZOPYRIDINE HCL 200 MG PO TABS
200.0000 mg | ORAL_TABLET | Freq: Three times a day (TID) | ORAL | Status: DC | PRN
Start: 2012-04-01 — End: 2012-07-07

## 2012-04-01 MED ORDER — FENTANYL CITRATE 0.05 MG/ML IJ SOLN
INTRAMUSCULAR | Status: DC | PRN
  Administered 2012-04-01: 25 ug via INTRAVENOUS
  Administered 2012-04-01: 50 ug via INTRAVENOUS
  Administered 2012-04-01 (×3): 25 ug via INTRAVENOUS
  Administered 2012-04-01: 50 ug via INTRAVENOUS

## 2012-04-01 MED ORDER — LACTATED RINGERS IV SOLN
INTRAVENOUS | Status: DC
  Administered 2012-04-01 (×3): via INTRAVENOUS

## 2012-04-01 MED ORDER — LACTATED RINGERS IV SOLN: INTRAVENOUS | Status: DC

## 2012-04-01 MED ORDER — PHENAZOPYRIDINE HCL 200 MG PO TABS
200.0000 mg | ORAL_TABLET | Freq: Three times a day (TID) | ORAL | Status: DC
  Administered 2012-04-01: 200 mg via ORAL

## 2012-04-01 MED ORDER — SODIUM CHLORIDE 0.9 % IR SOLN
Status: DC | PRN
  Administered 2012-04-01: 3000 mL via INTRAVESICAL

## 2012-04-01 MED ORDER — CIPROFLOXACIN IN D5W 400 MG/200ML IV SOLN: 400.0000 mg | INTRAVENOUS | Status: DC

## 2012-04-01 MED ORDER — STERILE WATER FOR IRRIGATION IR SOLN
Status: DC | PRN
  Administered 2012-04-01: 3000 mL

## 2012-04-01 MED ORDER — CIPROFLOXACIN IN D5W 200 MG/100ML IV SOLN
200.0000 mg | Freq: Once | INTRAVENOUS | Status: AC
  Administered 2012-04-01: 200 mg via INTRAVENOUS

## 2012-04-01 MED ORDER — DEXAMETHASONE SODIUM PHOSPHATE 4 MG/ML IJ SOLN
INTRAMUSCULAR | Status: DC | PRN
  Administered 2012-04-01: 4 mg via INTRAVENOUS

## 2012-04-01 MED ORDER — LIDOCAINE HCL (CARDIAC) 20 MG/ML IV SOLN
INTRAVENOUS | Status: DC | PRN
  Administered 2012-04-01: 70 mg via INTRAVENOUS

## 2012-04-01 MED ORDER — FENTANYL CITRATE 0.05 MG/ML IJ SOLN: 25.0000 ug | INTRAMUSCULAR | Status: DC | PRN

## 2012-04-01 MED ORDER — HYDROCODONE-ACETAMINOPHEN 7.5-325 MG PO TABS: 1.0000 | ORAL_TABLET | ORAL | Status: DC | PRN

## 2012-04-01 MED ORDER — ONDANSETRON HCL 4 MG/2ML IJ SOLN
INTRAMUSCULAR | Status: DC | PRN
  Administered 2012-04-01: 4 mg via INTRAVENOUS

## 2012-04-01 MED ORDER — PROPOFOL 10 MG/ML IV BOLUS
INTRAVENOUS | Status: DC | PRN
  Administered 2012-04-01: 150 mg via INTRAVENOUS

## 2012-04-01 MED ORDER — BELLADONNA ALKALOIDS-OPIUM 16.2-60 MG RE SUPP
1.0000 | Freq: Every day | RECTAL | Status: DC
  Administered 2012-04-01: 1 via RECTAL

## 2012-04-01 SURGICAL SUPPLY — 41 items
ADAPTER CATH URET PLST 4-6FR (CATHETERS) IMPLANT
ADPR CATH URET STRL DISP 4-6FR (CATHETERS)
BAG DRAIN URO-CYSTO SKYTR STRL (DRAIN) ×5 IMPLANT
BAG DRN UROCATH (DRAIN) ×4
BASKET LASER NITINOL 1.9FR (BASKET) IMPLANT
BASKET STNLS GEMINI 4WIRE 3FR (BASKET) IMPLANT
BASKET ZERO TIP NITINOL 2.4FR (BASKET) IMPLANT
BRUSH URET BIOPSY 3F (UROLOGICAL SUPPLIES) IMPLANT
BSKT STON RTRVL 120 1.9FR (BASKET)
BSKT STON RTRVL GEM 120X11 3FR (BASKET)
BSKT STON RTRVL ZERO TP 2.4FR (BASKET)
CANISTER SUCT LVC 12 LTR MEDI- (MISCELLANEOUS) ×2 IMPLANT
CATH CLEAR GEL 3F BACKSTOP (CATHETERS) IMPLANT
CATH INTERMIT  6FR 70CM (CATHETERS) ×4 IMPLANT
CATH URET 5FR 28IN CONE TIP (BALLOONS)
CATH URET 5FR 70CM CONE TIP (BALLOONS) IMPLANT
CLOTH BEACON ORANGE TIMEOUT ST (SAFETY) ×5 IMPLANT
CONT SPECI 4OZ STER CLIK (MISCELLANEOUS) ×2 IMPLANT
DRAPE CAMERA CLOSED 9X96 (DRAPES) ×5 IMPLANT
ELECT REM PT RETURN 9FT ADLT (ELECTROSURGICAL) ×5
ELECTRODE REM PT RTRN 9FT ADLT (ELECTROSURGICAL) ×4 IMPLANT
GLOVE BIO SURGEON STRL SZ8 (GLOVE) ×5 IMPLANT
GLOVE BIOGEL M 7.0 STRL (GLOVE) ×2 IMPLANT
GLOVE INDICATOR 7.0 STRL GRN (GLOVE) ×4 IMPLANT
GOWN PREVENTION PLUS LG XLONG (DISPOSABLE) ×5 IMPLANT
GOWN STRL REIN XL XLG (GOWN DISPOSABLE) ×5 IMPLANT
GUIDEWIRE 0.038 PTFE COATED (WIRE) IMPLANT
GUIDEWIRE ANG ZIPWIRE 038X150 (WIRE) IMPLANT
GUIDEWIRE STR DUAL SENSOR (WIRE) ×5 IMPLANT
IV NS IRRIG 3000ML ARTHROMATIC (IV SOLUTION) ×10 IMPLANT
KIT BALLIN UROMAX 15FX10 (LABEL) IMPLANT
KIT BALLN UROMAX 15FX4 (MISCELLANEOUS) IMPLANT
KIT BALLN UROMAX 26 75X4 (MISCELLANEOUS)
LASER FIBER DISP (UROLOGICAL SUPPLIES) IMPLANT
NEEDLE HYPO 22GX1.5 SAFETY (NEEDLE) IMPLANT
NS IRRIG 500ML POUR BTL (IV SOLUTION) IMPLANT
PACK CYSTOSCOPY (CUSTOM PROCEDURE TRAY) ×5 IMPLANT
SET HIGH PRES BAL DIL (LABEL)
SHEATH ACCESS URETERAL 38CM (SHEATH) IMPLANT
SHEATH ACCESS URETERAL 54CM (SHEATH) IMPLANT
WATER STERILE IRR 3000ML UROMA (IV SOLUTION) ×5 IMPLANT

## 2012-04-01 NOTE — Anesthesia Preprocedure Evaluation (Signed)
Anesthesia Evaluation  Patient identified by MRN, date of birth, ID band Patient awake    Reviewed: Allergy & Precautions, H&P , NPO status , Patient's Chart, lab work & pertinent test results  Airway Mallampati: II      Dental  (+) Teeth Intact and Dental Advisory Given   Pulmonary sleep apnea ,  breath sounds clear to auscultation        Cardiovascular hypertension, Pt. on medications and Pt. on home beta blockers + dysrhythmias Atrial Fibrillation + pacemaker + Valvular Problems/Murmurs Rhythm:Regular Rate:Normal + Systolic murmurs    Neuro/Psych PSYCHIATRIC DISORDERS TIACVA    GI/Hepatic negative GI ROS, Neg liver ROS, (+)     substance abuse  alcohol use,   Endo/Other  diabetes, Type 2  Renal/GU Renal diseasenegative Renal ROS     Musculoskeletal   Abdominal   Peds  Hematology negative hematology ROS (+)   Anesthesia Other Findings   Reproductive/Obstetrics                           Anesthesia Physical Anesthesia Plan  ASA: III  Anesthesia Plan: General   Post-op Pain Management:    Induction: Intravenous  Airway Management Planned: LMA  Additional Equipment:   Intra-op Plan:   Post-operative Plan: Extubation in OR  Informed Consent: I have reviewed the patients History and Physical, chart, labs and discussed the procedure including the risks, benefits and alternatives for the proposed anesthesia with the patient or authorized representative who has indicated his/her understanding and acceptance.   Dental advisory given  Plan Discussed with: CRNA  Anesthesia Plan Comments:         Anesthesia Quick Evaluation

## 2012-04-01 NOTE — Anesthesia Postprocedure Evaluation (Signed)
Anesthesia Post Note  Patient: [name]  Procedure(s) Performed: Procedure(s) (LRB): CYSTOSCOPY WITH BIOPSY (N/A) CYSTOSCOPY WITH RETROGRADE PYELOGRAM (Bilateral) URETEROSCOPY (Right)  Anesthesia type: General  Patient location: PACU  Post pain: Pain level controlled  Post assessment: Post-op Vital signs reviewed  Last Vitals:  Filed Vitals:   04/01/12 1140  BP: 158/75  Pulse: 69  Temp:   Resp: 13    Post vital signs: Reviewed  Level of consciousness: sedated  Complications: No apparent anesthesia complications

## 2012-04-01 NOTE — Interval H&P Note (Signed)
History and Physical Interval Note:  04/01/2012 9:23 AM  [name]  has presented today for surgery, with the diagnosis of POSITIVE CYTOLOGIES  The various methods of treatment have been discussed with the patient and family. After consideration of risks, benefits and other options for treatment, the patient has consented to  Procedure(s) (LRB) with comments: CYSTOSCOPY WITH RETROGRADE PYELOGRAM, URETEROSCOPY AND STENT PLACEMENT (Bilateral) - CYSTO WITH BLADDER BIOPSY, BIL RETROGRADES, POSSIBLE BIL URTEROSCOPY AND POSS STENT DIGITAL URETROSCOPE CAMERA FLORO/C-ARM PT IS DIABETIC PT HAS PACE MAKER CYSTOSCOPY WITH BIOPSY (N/A) - BLADDER BIOPSY as a surgical intervention .  The patient's history has been reviewed, patient examined, no change in status, stable for surgery.  I have reviewed the patient's chart and labs.  Questions were answered to the patient's satisfaction.     [place] Farm

## 2012-04-01 NOTE — Anesthesia Procedure Notes (Signed)
Procedure Name: LMA Insertion Date/Time: 04/01/2012 10:14 AM Performed by: Norva Pavlov Pre-anesthesia Checklist: Patient identified, Emergency Drugs available, Suction available and Patient being monitored Patient Re-evaluated:Patient Re-evaluated prior to inductionOxygen Delivery Method: Circle System Utilized Preoxygenation: Pre-oxygenation with 100% oxygen Intubation Type: IV induction Ventilation: Mask ventilation without difficulty LMA: LMA inserted LMA Size: 4.0 Number of attempts: 1 Airway Equipment and Method: bite block Placement Confirmation: positive ETCO2 Tube secured with: Tape Dental Injury: Teeth and Oropharynx as per pre-operative assessment

## 2012-04-01 NOTE — Discharge Instructions (Signed)
Post Bladder Surgery Instructions ° ° °General instructions: °   ° Your recent bladder surgery requires very little post hospital care but some definite precautions. ° °Despite the fact that no skin incisions were used, the area around the bladder incisions are raw and covered with scabs to promote healing and prevent bleeding. Certain precautions are needed to insure that the scabs are not disturbed over the next 2-4 weeks while the healing proceeds. ° °Because the raw surface inside your bladder and the irritating effects of urine you may expect frequency of urination and/or urgency (a stronger desire to urinate) and perhaps even getting up at night more often. This will usually resolve or improve slowly over the healing period. You may see some blood in your urine over the first 6 weeks. Do not be alarmed, even if the urine was clear for a while. Get off your feet and drink lots of fluids until clearing occurs. If you start to pass clots or don't improve call us. ° °Catheter: (If you are discharged with a catheter.) ° °1. Keep your catheter secured to your leg at all times with tape or the supplied strap. °2. You may experience leakage of urine around your catheter- as long as the  °catheter continues to drain, this is normal.  If your catheter stops draining  °go to the ER. °3. You may also have blood in your urine, even after it has been clear for  °several days; you may even pass some small blood clots or other material.  This  °is normal as well.  If this happens, sit down and drink plenty of water to help  °make urine to flush out your bladder.  If the blood in your urine becomes worse  °after doing this, contact our office or return to the ER. °4. You may use the leg bag (small bag) during the day, but use the large bag at  °night. ° °Diet: ° °You may return to your normal diet immediately. Because of the raw surface of your bladder, alcohol, spicy foods, foods high in acid and drinks with caffeine may  cause irritation or frequency and should be used in moderation. To keep your urine flowing freely and avoid constipation, drink plenty of fluids during the day (8-10 glasses). Tip: Avoid cranberry juice because it is very acidic. ° °Activity: ° °Your physical activity doesn't need to be restricted. However, if you are very active, you may see some blood in the urine. We suggest that you reduce your activity under the circumstances until the bleeding has stopped. ° °Bowels: ° °It is important to keep your bowels regular during the postoperative period. Straining with bowel movements can cause bleeding. A bowel movement every other day is reasonable. Use a mild laxative if needed, such as milk of magnesia 2-3 tablespoons, or 2 Dulcolax tablets. Call if you continue to have problems. If you had been taking narcotics for pain, before, during or after your surgery, you may be constipated. Take a laxative if necessary. ° ° ° °Medication: ° °You should resume your pre-surgery medications unless told not to. In addition you may be given an antibiotic to prevent or treat infection. Antibiotics are not always necessary. All medication should be taken as prescribed until the bottles are finished unless you are having an unusual reaction to one of the drugs. ° ° °Post Anesthesia Home Care Instructions ° °Activity: °Get plenty of rest for the remainder of the day. A responsible adult should stay with you for   24 hours following the procedure.  °For the next 24 hours, DO NOT: °-Drive a car °-Operate machinery °-Drink alcoholic beverages °-Take any medication unless instructed by your physician °-Make any legal decisions or sign important papers. ° °Meals: °Start with liquid foods such as gelatin or soup. Progress to regular foods as tolerated. Avoid greasy, spicy, heavy foods. If nausea and/or vomiting occur, drink only clear liquids until the nausea and/or vomiting subsides. Call your physician if vomiting continues. ° °Special  Instructions/Symptoms: °Your throat may feel dry or sore from the anesthesia or the breathing tube placed in your throat during surgery. If this causes discomfort, gargle with warm salt water. The discomfort should disappear within 24 hours. ° ° °

## 2012-04-01 NOTE — Op Note (Signed)
PATIENT:  [name]  PRE-OPERATIVE DIAGNOSIS:  1. History of high-grade urothelial carcinoma with associated CIS. 2. Cytology positive for malignant urothelial cells.  POST-OPERATIVE DIAGNOSIS:  Same  PROCEDURE:  Procedure(s): 1. Cystoscopy with bladder biopsy 2. Bilateral retrograde pyelograms with interpretation. 3. Right ureteroscopy 4. Barbotaged right and left renal pelvis and bladder.  SURGEON:  Garnett Farm  INDICATION: [name] is an 78 year old male who was initially found to have carcinoma in situ as well as high grade urothelial carcinoma in the area the prostatic urethra with no stromal invasion. Re\re resection and prostate biopsy as well as bladder biopsy showed no evidence of malignancy in the area of the prostate but CIS involving the left wall of the bladder. He was treated with a six-week induction course of BCG and repeat cystoscopy with bladder biopsies in 5/10 was negative for CIS. In 6/12 he had a positive cytology and underwent barbotage of the right and left renal pelvis as well as bladder and retrogrades with bladder biopsies but no abnormality upper tract was found but CIS was identified on the posterior wall the bladder and he underwent a second six-week induction course of BCG. He has since been free of recurrence however a recent cytology was positive for malignant cells. He is brought to the operating room for determination of the source.  ANESTHESIA:  General  EBL:  Minimal  DRAINS: None  LOCAL MEDICATIONS USED:  None  SPECIMEN:  1. Right and left renal pelvis barbotage for cytology 2. Bladder barbotage for cytology 3. Bladder biopsies to pathology   Description of procedure: After obtaining informed consent the patient was taken to the major or, placed on the table and administered general LMA anesthesia. He was then moved to the dorsal lithotomy position and his genitalia sterilely prepped and draped. An official timeout was then  performed.  The 22 French cystoscope was then passed under direct vision down the urethra which is noted be normal with a filamentous stricture in the area of the bulbar urethra the allowed easy passage of the scope. The prostatic urethra revealed surgical changes with a known shallow false passage in the floor of the prostatic urethra. The scope was then advanced beyond the bladder neck which revealed no abnormality and into the bladder. It was fully and systematically inspected with both the 12 and 70 lenses. No tumor stones or inflammatory lesions were identified. Multiple old biopsy scars were seen. I did note that the area of the right ureteral orifice and trigone seemed a little edematous relative to the left but no abnormality of the UOs was noted.  A 6 French open-ended ureteral catheter was then passed through the cystoscope and into the left ureteral orifice. I injected full-strength contrast to perform a left retrograde pyelogram under direct fluoroscopy. As the contrast passed up the left ureter I noted no abnormality and specifically no filling defects or mass effect. There is no hydronephrosis. The intrarenal collecting system was visualized and noted to be delicate and free of any filling defects or other abnormalities. I then passed a 0.038 inch floppy-tipped guidewire through the open ended catheter and up the left ureter into the area the renal pelvis. I passed the open-ended catheter into the area the renal pelvis and then removed the contrast that was present in the renal pelvis. Once this was complete I injected approximately 10 cc of sterile saline through the open ended catheter into the renal pelvis and then performed barbotage. The specimen was then sent  for cytology and the open-ended ureteral stent was then left in the left ureter.  I turned attention to the right ureter and performed a right retrograde pyelogram in an identical fashion noting no abnormality of the ureter or  intrarenal collecting system on the right side either. I then obtained a barbotage specimen from the right renal pelvis in an identical fashion as described above. I then left the open-ended catheter indwelling on that side in order to isolate both ureters from the specimen obtained from the bladder.  With both ureteral stents in place diverting any urine and any possible cells being flushed from the upper tract into the bladder I partially fill the bladder with sterile saline and then performed vigorous barbotage. This was sent to pathology as barbotaged specimen from the bladder. I then removed both of the ureteral catheters and performed bladder biopsies using the cold cup bladder biopsy forceps. I obtained a biopsy from the left posterior wall, the left wall, the trigonal region on the right-hand side, the right dome region and the bladder neck at the 6:00 position. I then used the Bugbee electrode to fulgurate all biopsy sites.  I then passed the open-ended catheter through the cystoscope and into the right ureteral orifice. I then passed a guidewire through the open ended catheter and left this in place. I elected to proceed with right ureteroscopy just to check the distal ureter since I didn't see what appeared to be some mild edema involving the right UO and expected course of the intramural ureter. The 6 French rigid ureteroscope was then passed under direct vision into the bladder and into the left ureteral orifice without need for any dilatation. I did not note any abnormality of the distal ureter. I therefore removed the ureteroscope and the guidewire. I then drained the bladder and the patient was awakened and taken to the recovery room in stable and satisfactory condition. He tolerated it well with no intraoperative complications.  PLAN OF CARE: Discharge to home after PACU  PATIENT DISPOSITION:  PACU - hemodynamically stable.

## 2012-04-01 NOTE — Transfer of Care (Signed)
Immediate Anesthesia Transfer of Care Note  Patient: [name]  Procedure(s) Performed: Procedure(s) (LRB): CYSTOSCOPY WITH BIOPSY (N/A) CYSTOSCOPY WITH RETROGRADE PYELOGRAM (Bilateral) URETEROSCOPY (Right)  Patient Location: PACU  Anesthesia Type: General  Level of Consciousness: awake, alert  and oriented  Airway & Oxygen Therapy: Patient Spontanous Breathing and Patient connected to face mask oxygen  Post-op Assessment: Report given to PACU RN and Post -op Vital signs reviewed and stable  Post vital signs: Reviewed and stable  Complications: No apparent anesthesia complications

## 2012-04-03 ENCOUNTER — Inpatient Hospital Stay (HOSPITAL_COMMUNITY)
Admission: EM | Admit: 2012-04-03 | Discharge: 2012-04-05 | DRG: 989 | Disposition: A | Discharge status: Home / Self Care | Payer: Medicare Other | Attending: Internal Medicine | Admitting: Internal Medicine

## 2012-04-03 ENCOUNTER — Emergency Department (HOSPITAL_COMMUNITY): Payer: Medicare Other

## 2012-04-03 ENCOUNTER — Encounter (HOSPITAL_COMMUNITY): Payer: Self-pay | Admitting: Emergency Medicine

## 2012-04-03 DIAGNOSIS — J189 Pneumonia, unspecified organism: Secondary | ICD-10-CM

## 2012-04-03 LAB — CBC
HCT: 36.6 % — ABNORMAL LOW (ref 39.0–52.0)
MCH: 31.5 pg (ref 26.0–34.0)
MCH: 31.7 pg (ref 26.0–34.0)
MCHC: 33.5 g/dL (ref 30.0–36.0)
MCHC: 33.9 g/dL (ref 30.0–36.0)
MCV: 93.6 fL (ref 78.0–100.0)
Platelets: 172 10*3/uL (ref 150–400)
Platelets: 152 10*3/uL (ref 150–400)
RDW: 14.2 % (ref 11.5–15.5)

## 2012-04-03 LAB — URINALYSIS, ROUTINE W REFLEX MICROSCOPIC
Protein, ur: 30 mg/dL — AB
Urobilinogen, UA: 1 mg/dL (ref 0.0–1.0)

## 2012-04-03 LAB — URINE MICROSCOPIC-ADD ON

## 2012-04-03 LAB — BASIC METABOLIC PANEL
Calcium: 9.1 mg/dL (ref 8.4–10.5)
GFR calc non Af Amer: 58 mL/min — ABNORMAL LOW (ref >90)
Sodium: 134 mEq/L — ABNORMAL LOW (ref 135–145)

## 2012-04-03 LAB — DIGOXIN LEVEL: Digoxin Level: 0.3 ng/mL — ABNORMAL LOW (ref 0.8–2.0)

## 2012-04-03 LAB — POCT I-STAT TROPONIN I: Troponin i, poc: 0.02 ng/mL (ref 0.00–0.08)

## 2012-04-03 MED ORDER — DEXTROSE 5 % IV SOLN
1.0000 g | INTRAVENOUS | Status: DC
  Administered 2012-04-03 – 2012-04-04 (×2): 1 g via INTRAVENOUS
  Filled 2012-04-03 (×3): qty 10

## 2012-04-03 MED ORDER — LEVETIRACETAM 250 MG PO TABS
250.0000 mg | ORAL_TABLET | Freq: Two times a day (BID) | ORAL | Status: DC
  Administered 2012-04-03 – 2012-04-05 (×4): 250 mg via ORAL
  Filled 2012-04-03 (×5): qty 1

## 2012-04-03 MED ORDER — ZOLPIDEM TARTRATE 5 MG PO TABS
5.0000 mg | ORAL_TABLET | Freq: Every evening | ORAL | Status: DC | PRN
  Administered 2012-04-03 – 2012-04-04 (×2): 5 mg via ORAL
  Filled 2012-04-03 (×2): qty 1

## 2012-04-03 MED ORDER — ONDANSETRON HCL 4 MG/2ML IJ SOLN: 4.0000 mg | Freq: Four times a day (QID) | INTRAMUSCULAR | Status: DC | PRN

## 2012-04-03 MED ORDER — ACETAMINOPHEN 650 MG RE SUPP: 650.0000 mg | Freq: Four times a day (QID) | RECTAL | Status: DC | PRN

## 2012-04-03 MED ORDER — FOLIC ACID 0.5 MG HALF TAB
0.5000 mg | ORAL_TABLET | Freq: Every day | ORAL | Status: DC
  Administered 2012-04-03 – 2012-04-05 (×3): 0.5 mg via ORAL
  Filled 2012-04-03 (×3): qty 1

## 2012-04-03 MED ORDER — ADULT MULTIVITAMIN W/MINERALS CH
1.0000 | ORAL_TABLET | Freq: Every day | ORAL | Status: DC
  Administered 2012-04-03 – 2012-04-05 (×3): 1 via ORAL
  Filled 2012-04-03 (×3): qty 1

## 2012-04-03 MED ORDER — RAMIPRIL 10 MG PO CAPS
10.0000 mg | ORAL_CAPSULE | Freq: Every day | ORAL | Status: DC
  Administered 2012-04-03 – 2012-04-05 (×3): 10 mg via ORAL
  Filled 2012-04-03 (×3): qty 1

## 2012-04-03 MED ORDER — SODIUM CHLORIDE 0.9 % IJ SOLN
3.0000 mL | Freq: Two times a day (BID) | INTRAMUSCULAR | Status: DC
  Administered 2012-04-03 – 2012-04-05 (×2): 3 mL via INTRAVENOUS

## 2012-04-03 MED ORDER — DEXTROSE 5 % IV SOLN
500.0000 mg | INTRAVENOUS | Status: DC
  Administered 2012-04-03 – 2012-04-04 (×2): 500 mg via INTRAVENOUS
  Filled 2012-04-03 (×3): qty 500

## 2012-04-03 MED ORDER — ATORVASTATIN CALCIUM 40 MG PO TABS
40.0000 mg | ORAL_TABLET | Freq: Every day | ORAL | Status: DC
Start: 2012-04-03 — End: 2012-04-05
  Administered 2012-04-03 – 2012-04-05 (×3): 40 mg via ORAL
  Filled 2012-04-03 (×3): qty 1

## 2012-04-03 MED ORDER — FOLIC ACID 400 MCG PO TABS: 400.0000 ug | ORAL_TABLET | Freq: Every day | ORAL | Status: DC

## 2012-04-03 MED ORDER — DOCUSATE SODIUM 100 MG PO CAPS
100.0000 mg | ORAL_CAPSULE | Freq: Two times a day (BID) | ORAL | Status: DC
  Administered 2012-04-03 – 2012-04-05 (×5): 100 mg via ORAL
  Filled 2012-04-03 (×6): qty 1

## 2012-04-03 MED ORDER — ALBUTEROL SULFATE (5 MG/ML) 0.5% IN NEBU: 2.5000 mg | INHALATION_SOLUTION | RESPIRATORY_TRACT | Status: DC | PRN

## 2012-04-03 MED ORDER — METOPROLOL SUCCINATE ER 25 MG PO TB24
25.0000 mg | ORAL_TABLET | Freq: Every day | ORAL | Status: DC
Start: 2012-04-03 — End: 2012-04-05
  Administered 2012-04-03 – 2012-04-05 (×3): 25 mg via ORAL
  Filled 2012-04-03 (×3): qty 1

## 2012-04-03 MED ORDER — ALUM & MAG HYDROXIDE-SIMETH 200-200-20 MG/5ML PO SUSP: 30.0000 mL | Freq: Four times a day (QID) | ORAL | Status: DC | PRN

## 2012-04-03 MED ORDER — INFLUENZA VIRUS VACC SPLIT PF IM SUSP
0.5000 mL | INTRAMUSCULAR | Status: AC
  Administered 2012-04-04: 0.5 mL via INTRAMUSCULAR
  Filled 2012-04-03: qty 0.5

## 2012-04-03 MED ORDER — PHENAZOPYRIDINE HCL 200 MG PO TABS
200.0000 mg | ORAL_TABLET | Freq: Three times a day (TID) | ORAL | Status: DC | PRN
Start: 2012-04-03 — End: 2012-04-05
  Filled 2012-04-03: qty 1

## 2012-04-03 MED ORDER — ONDANSETRON HCL 4 MG PO TABS: 4.0000 mg | ORAL_TABLET | Freq: Four times a day (QID) | ORAL | Status: DC | PRN

## 2012-04-03 MED ORDER — HYDROCODONE-ACETAMINOPHEN 7.5-325 MG PO TABS
1.0000 | ORAL_TABLET | ORAL | Status: DC | PRN
  Administered 2012-04-04: 2 via ORAL
  Filled 2012-04-03: qty 2

## 2012-04-03 MED ORDER — POTASSIUM CHLORIDE IN NACL 20-0.9 MEQ/L-% IV SOLN
INTRAVENOUS | Status: DC
  Administered 2012-04-03 (×2): via INTRAVENOUS
  Filled 2012-04-03 (×2): qty 1000

## 2012-04-03 MED ORDER — DIGOXIN 125 MCG PO TABS
0.1250 mg | ORAL_TABLET | Freq: Every day | ORAL | Status: DC
  Administered 2012-04-03 – 2012-04-05 (×3): 0.125 mg via ORAL
  Filled 2012-04-03 (×3): qty 1

## 2012-04-03 MED ORDER — ACETAMINOPHEN 325 MG PO TABS: 650.0000 mg | ORAL_TABLET | Freq: Four times a day (QID) | ORAL | Status: DC | PRN

## 2012-04-03 NOTE — ED Notes (Signed)
Pt presenting to ed with c/o coughing up blood onset 5:00am pt states he had bladder procedure x 2 days ago. Pt states he also had onset of chest pain earlier this morning pt denies chest pain at this time. Pt states positive nausea. Pt denies blood in his stool pt states he is still urinating blood since the procedure

## 2012-04-03 NOTE — H&P (Signed)
PCP:   [name], MD   Chief Complaint:  Cough and congestion  HPI: [name] is an 78 year old male that I have not seen in my office in some time as he has chosen to cancel his last 2 office visits, with his last being in 2012.  I did receive notice that he underwent a repat cystoscopy for bladder cancer surveillance earlier this week per Dr. [name].  SInce that time he has complained of cough and congestion, coming to the ER this morning.  He was found to have a RLL pneumonia.  Given his age and general health status, he requires admission.  Of note, he states that he had no symptoms of respiratory illness prior to his cystoscopy and intubation for procedure on Friday.  Review of Systems  General:       Denies fevers, chills, sweats.   Eyes:       Denies blurring, diplopia.   Ears/Nose/Throat:       Denies earache, ear discharge, nosebleeds, sore throat.   Cardiovascular:       Denies chest pains, palpitations.   Respiratory:       Positive cough,dyspnea.   Gastrointestinal:       Denies nausea, vomiting, diarrhea, constipation.   Musculoskeletal:       Complains of see HPI, joint pain.   Neurologic:       Denies weakness.   Psychiatric:       Denies depression, anxiety.    Family History (reviewed - no changes required): Father d 97 Lung CA Mother d 58, multiple CVA's. 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 Atrial fibrillation 1990  . Dyslipidemia   . Prostate hypertrophy   . Arthritis     degenerative-knees  . History of radiation therapy 05/05/07-06/15/07    right parotid through subclavicular region/ mid jugularlymph node chain  . Allergy   . Hypertension     labile  . Heart murmur     2/6 systolic  .  History of chemotherapy      Hx carboplatin/26fu  . Xerostomia     limited  . Stroke 2003    TIA  . Bladder cancer 2010  . Cancer 05/05/07-06/15/07    parotid gland/66.4 GY  . HOH (hard of hearing)   . Sleep apnea     Not diagnosied; pt denies 03/29/12  . Pacemaker     2003 vent paced , rate 69  . Chronic kidney disease     positive cytology  . Dry eyes   . Diabetes mellitus without complication     meds d/c  1 year ago   Past Surgical History  Procedure Date  . Pacemaker insertion 1990    last changed 2003  . Cystoscopy 01/2011    neg  . Back surgery   . Parotidectomy     right  . Craniotomy 12/14/2011    Procedure: CRANIOTOMY HEMATOMA EVACUATION SUBDURAL;  Surgeon: Cristi Loron, MD;  Location: [facility];  Service: Neurosurgery;  Laterality: Left;  LEFT Craniotomy for subdural   . Neurofibroma excision 1980    Medications: Prior to Admission medications   Medication Sig Start Date End Date Taking? Authorizing Provider  atorvastatin (LIPITOR) 40 MG tablet Take 40  mg by mouth daily.    Yes Historical Provider, MD  digoxin (LANOXIN) 0.25 MG tablet Take 0.125 mg by mouth daily. Takes 1/2 tab   Yes Historical Provider, MD  folic acid (FOLVITE) 400 MCG tablet Take 400 mcg by mouth daily.   Yes Historical Provider, MD  HYDROcodone-acetaminophen (NORCO) 7.5-325 MG per tablet Take 1-2 tablets by mouth every 4 (four) hours as needed for pain. 04/01/12  Yes Garnett Farm, MD  levETIRAcetam (KEPPRA) 500 MG tablet Take 250 mg by mouth 2 (two) times daily. Takes 1/2 tablet 12/22/11 12/21/12 Yes Clydene Fake, MD  metoprolol succinate (TOPROL-XL) 25 MG 24 hr tablet Take 25 mg by mouth daily.   Yes Historical Provider, MD  Multiple Vitamin (MULITIVITAMIN WITH MINERALS) TABS Take 1 tablet by mouth daily.   Yes Historical Provider, MD  phenazopyridine (PYRIDIUM) 200 MG tablet Take 1 tablet (200 mg total) by mouth 3 (three) times daily as needed for pain. 04/01/12  Yes Garnett Farm, MD   ramipril (ALTACE) 10 MG capsule Take 10 mg by mouth daily.   Yes Historical Provider, MD  zolpidem (AMBIEN) 10 MG tablet Take 10 mg by mouth at bedtime as needed. For sleep   Yes Historical Provider, MD    Allergies:   Allergies  Allergen Reactions  . Codeine     Blisters between fingers  . Docetaxel Rash    Physical Exam: Filed Vitals:   04/03/12 0835 04/03/12 0906 04/03/12 0922 04/03/12 0926  BP: 170/81   133/71  Pulse: 90  76   Temp: 98.6 F (37 C)     TempSrc: Oral     Resp: 20  18   SpO2: 94% 94% 89% 95%   General appearance: alert, cooperative and appears stated age Head: Normocephalic, without obvious abnormality, atraumatic Eyes: conjunctivae/corneas clear. PERRL, EOM's intact.  Nose: Nares normal. Septum midline. Mucosa normal. No drainage or sinus tenderness. Throat: lips, mucosa, and tongue normal; teeth and gums normal Neck: no adenopathy, no carotid bruit, no JVD and thyroid not enlarged, symmetric, no tenderness/mass/nodules Resp: diminished breath sounds RLL Cardio: regular rate and rhythm, S1, S2 normal, no murmur, click, rub or gallop GI: soft, non-tender; bowel sounds normal; no masses,  no organomegaly Extremities: extremities normal, atraumatic, no cyanosis or edema Pulses: 2+ and symmetric Lymph nodes: Cervical adenopathy: no cervical lymphadenopathy Neurologic: Alert and oriented X 3, normal strength and tone. Normal symmetric reflexes.     Labs on Admission:   [facility] 04/03/12 0925 04/01/12 0952  NA 134* 140  K 3.7 4.2  CL 96 --  CO2 28 --  GLUCOSE 109* 102*  BUN 21 --  CREATININE 1.16 --  CALCIUM 9.1 --  MG -- --  PHOS -- --   Basename 04/03/12 0925 04/01/12 0952  WBC 11.4* --  NEUTROABS -- --  HGB 13.4 16.0  HCT 40.0 47.0  MCV 93.9 --  PLT 172 --    Radiological Exams on Admission: Dg Chest 2 View  04/03/2012  *RADIOLOGY REPORT*  Clinical Data: Hemoptysis and chest pain.  CHEST - 2 VIEW  Comparison: Chest x-ray 10/05/2011.   Findings: Right internal jugular single lumen Port-A-Cath with tip terminating near the superior cavoatrial junction.  A left-sided pacemaker device in place with lead tips projecting over the expected location of the right atrial appendage and right ventricular apex.  Persistent right lower lobe airspace consolidation is similar.  No definite pleural effusion.  Mild pulmonary venous congestion without frank pulmonary edema.  Heart  size is upper limits of normal.  Mediastinal contours are unremarkable.  Atherosclerosis of the thoracic aorta.  IMPRESSION: 1.  Persistent right lower lobe airspace consolidation, concerning for pneumonia or sequela of aspiration. 2.  Pulmonary venous congestion without frank pulmonary edema. 3.  Atherosclerosis.   Original Report Authenticated By: Florencia Reasons, M.D.    EKG demonstrates ventricular pacing  Assessment/Plan 1) RLL Pneumonia- Rocephin/Azithromycin, oxygen, nebs prn, pulmonary toilet.  Possibly aspiration related from recent intubation. 2) Bladder cancer- expect some hematuria,TED hose for DVT prophylaxis 3) Parotid gland CA- S/P XRT, has had f/u with ENT earlier this year.  Notes that his port has not been flushed on schedule either 4) HTN- Controlled 5) Hyperlipidemia- Continue current meds 6) Cerebrovascular disease- Continue med management 7) Arrhythmia s/p pacer about 10 years ago. 8) Chronic insomnia 9) Will provide influenza vaccine for him as well as he has not had it yet this year.   [name] 04/03/2012, 11:24 AM

## 2012-04-03 NOTE — ED Provider Notes (Signed)
History     CSN: [id number]  Arrival date & time 04/03/12  4098   First MD Initiated Contact with Patient 04/03/12 [phone]      Chief Complaint  Patient presents with  . Hemoptysis  . Chest Pain    (Consider location/radiation/quality/duration/timing/severity/associated sxs/prior treatment) Patient is a 78 y.o. male presenting with chest pain. The history is provided by the patient.  Chest Pain    patient here with increased cough congestion x24 is. Denies any fever but did have some chills. Patient notes some blood-tinged sputum. Denies any orthopnea or dyspnea on exertion. Denies any anginal type chest pain. Recently had a urological procedure done he continues to have some hematuria.  Past Medical History  Diagnosis Date  . Atrial fibrillation 1990  . Dyslipidemia   . Prostate hypertrophy   . Arthritis     degenerative-knees  . History of radiation therapy 05/05/07-06/15/07    right parotid through subclavicular region/ mid jugularlymph node chain  . Allergy   . Hypertension     labile  . Heart murmur     2/6 systolic  . History of chemotherapy      Hx carboplatin/28fu  . Xerostomia     limited  . Stroke 2003    TIA  . Bladder cancer 2010  . Cancer 05/05/07-06/15/07    parotid gland/66.4 GY  . HOH (hard of hearing)   . Sleep apnea     Not diagnosied; pt denies 03/29/12  . Pacemaker     2003 vent paced , rate 69  . Chronic kidney disease     positive cytology  . Dry eyes   . Diabetes mellitus without complication     meds d/c  1 year ago    Past Surgical History  Procedure Date  . Pacemaker insertion 1990    last changed 2003  . Cystoscopy 01/2011    neg  . Back surgery   . Parotidectomy     right  . Craniotomy 12/14/2011    Procedure: CRANIOTOMY HEMATOMA EVACUATION SUBDURAL;  Surgeon: Cristi Loron, MD;  Location: [facility];  Service: Neurosurgery;  Laterality: Left;  LEFT Craniotomy for subdural   . Neurofibroma excision 1980    Family  History  Problem Relation Age of Onset  . Heart disease Mother   . Cancer Father   . Emphysema Brother   . Coronary artery disease Son     History  Substance Use Topics  . Smoking status: Never Smoker   . Smokeless tobacco: Not on file  . Alcohol Use: 6.0 oz/week    12 drink(s) per week     daily      Review of Systems  Cardiovascular: Positive for chest pain.  All other systems reviewed and are negative.    Allergies  Codeine and Docetaxel  Home Medications   Current Outpatient Rx  Name Route Sig Dispense Refill  . ATORVASTATIN CALCIUM 40 MG PO TABS Oral Take 40 mg by mouth daily.     Marland Kitchen DIGOXIN 0.25 MG PO TABS Oral Take 0.125 mg by mouth daily. Takes 1/2 tab    . FOLIC ACID 400 MCG PO TABS Oral Take 400 mcg by mouth daily.    Marland Kitchen HYDROCODONE-ACETAMINOPHEN 7.5-325 MG PO TABS Oral Take 1-2 tablets by mouth every 4 (four) hours as needed for pain. 40 tablet 0  . LEVETIRACETAM 500 MG PO TABS Oral Take 250 mg by mouth 2 (two) times daily. Takes 1/2 tablet    . METOPROLOL  SUCCINATE ER 25 MG PO TB24 Oral Take 25 mg by mouth daily.    . ADULT MULTIVITAMIN W/MINERALS CH Oral Take 1 tablet by mouth daily.    Marland Kitchen PHENAZOPYRIDINE HCL 200 MG PO TABS Oral Take 1 tablet (200 mg total) by mouth 3 (three) times daily as needed for pain. 30 tablet 0  . RAMIPRIL 10 MG PO CAPS Oral Take 10 mg by mouth daily.    Marland Kitchen ZOLPIDEM TARTRATE 10 MG PO TABS Oral Take 10 mg by mouth at bedtime as needed. For sleep      BP 170/81  Pulse 90  Temp 98.6 F (37 C) (Oral)  Resp 20  SpO2 94%  Physical Exam  Nursing note and vitals reviewed. Constitutional: He is oriented to person, place, and time. He appears well-developed and well-nourished.  Non-toxic appearance. No distress.  HENT:  Head: Normocephalic and atraumatic.  Eyes: Conjunctivae normal, EOM and lids are normal. Pupils are equal, round, and reactive to light.  Neck: Normal range of motion. Neck supple. No tracheal deviation present. No mass  present.  Cardiovascular: Normal rate, regular rhythm and normal heart sounds.  Exam reveals no gallop.   No murmur heard. Pulmonary/Chest: Effort normal. No stridor. No respiratory distress. He has decreased breath sounds. He has no wheezes. He has rhonchi. He has no rales.  Abdominal: Soft. Normal appearance and bowel sounds are normal. He exhibits no distension. There is no tenderness. There is no rebound and no CVA tenderness.  Musculoskeletal: Normal range of motion. He exhibits no edema and no tenderness.  Neurological: He is alert and oriented to person, place, and time. He has normal strength. No cranial nerve deficit or sensory deficit. GCS eye subscore is 4. GCS verbal subscore is 5. GCS motor subscore is 6.  Skin: Skin is warm and dry. No abrasion and no rash noted.  Psychiatric: He has a normal mood and affect. His speech is normal and behavior is normal.    ED Course  Procedures (including critical care time)   Labs Reviewed  CBC  BASIC METABOLIC PANEL  URINALYSIS, ROUTINE W REFLEX MICROSCOPIC  DIGOXIN LEVEL   Dg Chest 2 View  04/03/2012  *RADIOLOGY REPORT*  Clinical Data: Hemoptysis and chest pain.  CHEST - 2 VIEW  Comparison: Chest x-ray 10/05/2011.  Findings: Right internal jugular single lumen Port-A-Cath with tip terminating near the superior cavoatrial junction.  A left-sided pacemaker device in place with lead tips projecting over the expected location of the right atrial appendage and right ventricular apex.  Persistent right lower lobe airspace consolidation is similar.  No definite pleural effusion.  Mild pulmonary venous congestion without frank pulmonary edema.  Heart size is upper limits of normal.  Mediastinal contours are unremarkable.  Atherosclerosis of the thoracic aorta.  IMPRESSION: 1.  Persistent right lower lobe airspace consolidation, concerning for pneumonia or sequela of aspiration. 2.  Pulmonary venous congestion without frank pulmonary edema. 3.   Atherosclerosis.   Original Report Authenticated By: Florencia Reasons, M.D.      No diagnosis found.    MDM   Date: 04/02/2012  Rate: 73  Rhythm: Paced rhythm  QRS Axis: normal  Intervals: normal  ST/T Wave abnormalities: indeterminate  Conduction Disutrbances:nonspecific intraventricular conduction delay  Narrative Interpretation:   Old EKG Reviewed: unchanged  11:18 AM Patient started on antibiotics. Will be admitted for his pneumonia      Toy Baker, MD 04/03/12 1118

## 2012-04-03 NOTE — ED Notes (Signed)
Attempted to call report, RN was busy. Will call back for report.

## 2012-04-04 ENCOUNTER — Inpatient Hospital Stay (HOSPITAL_COMMUNITY): Payer: Medicare Other

## 2012-04-04 ENCOUNTER — Encounter (HOSPITAL_BASED_OUTPATIENT_CLINIC_OR_DEPARTMENT_OTHER): Payer: Self-pay | Admitting: Urology

## 2012-04-04 LAB — COMPREHENSIVE METABOLIC PANEL
ALT: 8 U/L (ref 0–53)
Alkaline Phosphatase: 60 U/L (ref 39–117)
CO2: 29 mEq/L (ref 19–32)
GFR calc Af Amer: >90 mL/min (ref >90)
GFR calc non Af Amer: 80 mL/min — ABNORMAL LOW (ref >90)
Glucose, Bld: 105 mg/dL — ABNORMAL HIGH (ref 70–99)
Potassium: 3.9 mEq/L (ref 3.5–5.1)
Sodium: 136 mEq/L (ref 135–145)
Total Bilirubin: 0.9 mg/dL (ref 0.3–1.2)

## 2012-04-04 LAB — CBC
Hemoglobin: 12 g/dL — ABNORMAL LOW (ref 13.0–17.0)
MCH: 31.7 pg (ref 26.0–34.0)
RBC: 3.79 MIL/uL — ABNORMAL LOW (ref 4.22–5.81)

## 2012-04-04 MED ORDER — POLYETHYLENE GLYCOL 3350 17 G PO PACK
17.0000 g | PACK | Freq: Two times a day (BID) | ORAL | Status: DC
  Administered 2012-04-04 – 2012-04-05 (×2): 17 g via ORAL
  Filled 2012-04-04 (×3): qty 1

## 2012-04-04 MED ORDER — SODIUM CHLORIDE 0.9 % IJ SOLN
10.0000 mL | INTRAMUSCULAR | Status: DC | PRN
  Administered 2012-04-04 – 2012-04-05 (×2): 10 mL

## 2012-04-04 MED ORDER — INFLUENZA VIRUS VACC SPLIT PF IM SUSP
0.5000 mL | INTRAMUSCULAR | Status: DC
  Filled 2012-04-04: qty 0.5

## 2012-04-04 NOTE — Progress Notes (Signed)
Subjective: Feels ok this morning.  Still has a bit of a cough.  Notes that his urine seems to be less frequent and less blood tinged than yesterday.  Objective: Vital signs in last 24 hours: Temp:  [97.9 F (36.6 C)-98.7 F (37.1 C)] 98.3 F (36.8 C) (10/14 0620) Pulse Rate:  [68-90] 69  (10/14 0620) Resp:  [12-24] 20  (10/14 0620) BP: (103-170)/(57-83) 141/83 mmHg (10/14 0620) SpO2:  [89 %-98 %] 95 % (10/14 0620) Weight:  [85.1 kg (187 lb 9.8 oz)] 85.1 kg (187 lb 9.8 oz) (10/13 1505) Weight change:  Last BM Date: 04/02/12  Intake/Output from previous day: 10/13 0701 - 10/14 0700 In: 370 [I.V.:370] Out: 1600 [Urine:1600] Intake/Output this shift:   General appearance: alert, cooperative and appears stated age  Resp: diminished breath sounds RLL  Cardio: regular rate and rhythm, S1, S2 normal, no murmur, click, rub or gallop  GI: soft, non-tender; bowel sounds normal; no masses, no organomegaly  Extremities: extremities normal, atraumatic, no cyanosis or edema  Pulses: 2+ and symmetric  Neurologic: Alert and oriented X 3, normal strength and tone. Normal symmetric reflexes.    Lab Results:  [facility] 04/04/12 0550 04/03/12 1628  WBC 6.7 9.9  HGB 12.0* 12.4*  HCT 35.7* 36.6*  PLT 144* 152   BMET  Basename 04/04/12 0550 04/03/12 1628 04/03/12 0925  NA 136 -- 134*  K 3.9 -- 3.7  CL 101 -- 96  CO2 29 -- 28  GLUCOSE 105* -- 109*  BUN 14 -- 21  CREATININE 0.85 0.93 --  CALCIUM 8.6 -- 9.1    Studies/Results: Dg Chest 2 View  04/03/2012  *RADIOLOGY REPORT*  Clinical Data: Hemoptysis and chest pain.  CHEST - 2 VIEW  Comparison: Chest x-ray 10/05/2011.  Findings: Right internal jugular single lumen Port-A-Cath with tip terminating near the superior cavoatrial junction.  A left-sided pacemaker device in place with lead tips projecting over the expected location of the right atrial appendage and right ventricular apex.  Persistent right lower lobe airspace consolidation is  similar.  No definite pleural effusion.  Mild pulmonary venous congestion without frank pulmonary edema.  Heart size is upper limits of normal.  Mediastinal contours are unremarkable.  Atherosclerosis of the thoracic aorta.  IMPRESSION: 1.  Persistent right lower lobe airspace consolidation, concerning for pneumonia or sequela of aspiration. 2.  Pulmonary venous congestion without frank pulmonary edema. 3.  Atherosclerosis.   Original Report Authenticated By: Florencia Reasons, M.D.     Medications:  I have reviewed the patient's current medications. Scheduled:   . atorvastatin  40 mg Oral Daily  . azithromycin  500 mg Intravenous Q24H  . cefTRIAXone (ROCEPHIN)  IV  1 g Intravenous Q24H  . digoxin  0.125 mg Oral Daily  . docusate sodium  100 mg Oral BID  . folic acid  0.5 mg Oral Daily  . influenza  inactive virus vaccine  0.5 mL Intramuscular Tomorrow-1000  . levETIRAcetam  250 mg Oral BID  . metoprolol succinate  25 mg Oral Daily  . multivitamin with minerals  1 tablet Oral Daily  . ramipril  10 mg Oral Daily  . sodium chloride  3 mL Intravenous Q12H  . DISCONTD: folic acid  400 mcg Oral Daily   Continuous:   . DISCONTD: 0.9 % NaCl with KCl 20 mEq / L 50 mL/hr at 04/03/12 1629   ZOX:[id number], acetaminophen, albuterol, alum & mag hydroxide-simeth, HYDROcodone-acetaminophen, ondansetron (ZOFRAN) IV, ondansetron, phenazopyridine, sodium chloride, zolpidem  Assessment/Plan: 1)  RLL Pneumonia- Rocephin/Azithromycin, oxygen, nebs prn, pulmonary toilet. Possibly aspiration related from recent intubation. Doing ok, but less than 24 hours from admission 2) Bladder cancer- expect some hematuria,TED hose for DVT prophylaxis  3) Parotid gland CA- S/P XRT, has had f/u with ENT earlier this year.Port was flushed yesterday. 4) HTN- Controlled  5) Hyperlipidemia- Continue current meds  6) Cerebrovascular disease- Continue med management  7) Arrhythmia s/p pacer about 10 years ago.  8)  Chronic insomnia  9) Will provide influenza vaccine for him as well as he has not had it yet this year.  PT eval today.  If does well in next 24 hours, will send home with oral antibiotics and close followup.  LOS: 1 day   TISOVEC,[name] 04/04/2012, 7:57 AM

## 2012-04-05 MED ORDER — CEFUROXIME AXETIL 500 MG PO TABS: 500.0000 mg | ORAL_TABLET | Freq: Two times a day (BID) | ORAL | Status: DC

## 2012-04-05 MED ORDER — HEPARIN SOD (PORK) LOCK FLUSH 100 UNIT/ML IV SOLN
500.0000 [IU] | INTRAVENOUS | Status: AC | PRN
  Administered 2012-04-05: 500 [IU]

## 2012-04-05 NOTE — Progress Notes (Signed)
Porta cath deaccessed. Flushed with 10cc NS followed by Heparin 5ml (100u/ml). No bleeding to site, band aid to site for comfort. [name] 

## 2012-04-05 NOTE — Discharge Summary (Signed)
DISCHARGE SUMMARY  [name]  MR#: [id number]  DOB:31-Jul-1929  Date of Admission: 04/03/2012 Date of Discharge: 04/05/2012  Attending Physician:Eisha Chatterjee W  Patient's JXB:JYNWGNF,AOZHYQM W, MD  Consults: None  Discharge Diagnoses: Pneumonia, probable aspiration vs hospital acquired Bladder CA s/p cystoscopy last week Parotid gland Cancer, s/p treatment HTN Hyperlipidemia Cerebrovascular disease Arrhythmia s/p pacer Chronic insomnia Diet controlled Diabetes Type 2  Discharge Medications:   Medication List     As of 04/05/2012  7:55 AM    TAKE these medications         atorvastatin 40 MG tablet   Commonly known as: LIPITOR   Take 40 mg by mouth daily.      cefUROXime 500 MG tablet   Commonly known as: CEFTIN   Take 1 tablet (500 mg total) by mouth 2 (two) times daily.      digoxin 0.25 MG tablet   Commonly known as: LANOXIN   Take 0.125 mg by mouth daily. Takes 1/2 tab      folic acid 400 MCG tablet   Commonly known as: FOLVITE   Take 400 mcg by mouth daily.      HYDROcodone-acetaminophen 7.5-325 MG per tablet   Commonly known as: NORCO   Take 1-2 tablets by mouth every 4 (four) hours as needed for pain.      levETIRAcetam 500 MG tablet   Commonly known as: KEPPRA   Take 250 mg by mouth 2 (two) times daily. Takes 1/2 tablet      metoprolol succinate 25 MG 24 hr tablet   Commonly known as: TOPROL-XL   Take 25 mg by mouth daily.      multivitamin with minerals Tabs   Take 1 tablet by mouth daily.      phenazopyridine 200 MG tablet   Commonly known as: PYRIDIUM   Take 1 tablet (200 mg total) by mouth 3 (three) times daily as needed for pain.      ramipril 10 MG capsule   Commonly known as: ALTACE   Take 10 mg by mouth daily.      zolpidem 10 MG tablet   Commonly known as: AMBIEN   Take 10 mg by mouth at bedtime as needed. For sleep        Hospital Procedures: X-ray Chest Pa And Lateral   04/04/2012  *RADIOLOGY REPORT*  Clinical  Data: Follow-up pneumonia.  CHEST - 2 VIEW  Comparison: 04/03/2012.  Findings: The cardiac silhouette remains borderline enlarged. Stable left subclavian pacemaker leads and right jugular porta- catheter.  No significant change in right lower lobe airspace opacity.  No acute bony abnormality.  IMPRESSION: Stable right lower lobe pneumonia.   Original Report Authenticated By: Darrol Angel, M.D.    Dg Chest 2 View  04/03/2012  *RADIOLOGY REPORT*  Clinical Data: Hemoptysis and chest pain.  CHEST - 2 VIEW  Comparison: Chest x-ray 10/05/2011.  Findings: Right internal jugular single lumen Port-A-Cath with tip terminating near the superior cavoatrial junction.  A left-sided pacemaker device in place with lead tips projecting over the expected location of the right atrial appendage and right ventricular apex.  Persistent right lower lobe airspace consolidation is similar.  No definite pleural effusion.  Mild pulmonary venous congestion without frank pulmonary edema.  Heart size is upper limits of normal.  Mediastinal contours are unremarkable.  Atherosclerosis of the thoracic aorta.  IMPRESSION: 1.  Persistent right lower lobe airspace consolidation, concerning for pneumonia or sequela of aspiration. 2.  Pulmonary venous congestion without frank  pulmonary edema. 3.  Atherosclerosis.   Original Report Authenticated By: Florencia Reasons, M.D.     History of Present Illness: Patient is an 77 year old male lost to primary care followup this year who came to the ER Sunday morning with new RLL following cystoscopy the preceeding Friday.  Hospital Course: [name] was admitted and started on empiric Rocephin and Azithromycin per the ER.  Elevated WBC count and RLL pneumonia seen on chest x-ray.  Initial need for oxygen.  Improved over the course of the following 48 hours.  Cough improved by day #2.  CXR done shortly after showed stability, not resolution, but clinically improved.  His other medical conditions  remained stable.  He is being discharged in good condition with followup next week to include CXR, and reestablishing with my office for Primary Care.  Day of Discharge Exam BP 151/81  Pulse 69  Temp 99.2 F (37.3 C) (Oral)  Resp 18  Ht 5' 9.5" (1.765 m)  Wt 82.4 kg (181 lb 10.5 oz)  BMI 26.44 kg/m2  SpO2 93%  Physical Exam: General appearance: alert, cooperative and appears stated age  Resp: improved breath sounds RLL, just slight crackle on inspiration remains Cardio: regular rate and rhythm, S1, S2 normal, no murmur, click, rub or gallop  GI: soft, non-tender; bowel sounds normal; no masses, no organomegaly  Extremities: extremities normal, atraumatic, no cyanosis or edema  Pulses: 2+ and symmetric  Neurologic: Alert and oriented X 3, normal strength and tone. Normal symmetric reflexes.   Discharge Labs:  [facility] 04/04/12 0550 04/03/12 1628 04/03/12 0925  NA 136 -- 134*  K 3.9 -- 3.7  CL 101 -- 96  CO2 29 -- 28  GLUCOSE 105* -- 109*  BUN 14 -- 21  CREATININE 0.85 0.93 --  CALCIUM 8.6 -- 9.1  MG -- -- --  PHOS -- -- --    Basename 04/04/12 0550  AST 14  ALT 8  ALKPHOS 60  BILITOT 0.9  PROT 5.9*  ALBUMIN 3.1*    Basename 04/04/12 0550 04/03/12 1628  WBC 6.7 9.9  NEUTROABS -- --  HGB 12.0* 12.4*  HCT 35.7* 36.6*  MCV 94.2 93.6  PLT 144* 152   Discharge instructions:     Discharge Orders    Future Appointments: Provider: Department: Dept Phone: Center:   07/07/2012 10:00 AM [name], MD [facility] [phone] None      Disposition: Home with PO antibiotics to compete 1 more week of care. Follow-up Appts: Follow-up with Dr. Wylene Simmer at [facility] on Wednesday October 23 at 11:15am.  Condition on Discharge: Improved   Signed: Ermel Verne W 04/05/2012, 7:55 AM

## 2012-04-05 NOTE — Progress Notes (Signed)
Jeronimo Greaves Core RN stated that she administered the flu vaccine in left arm 04/04/12.  Patient verifies that he received this injection.

## 2012-04-05 NOTE — Evaluation (Signed)
Physical Therapy Evaluation Patient Details Name: [name] MRN: [id number] DOB: [birth year] Today's Date: 04/05/2012 Time: 7829-5621 PT Time Calculation (min): 17 min  PT Assessment / Plan / Recommendation Clinical Impression  78 yo male admitted with Pna. Hx of bladder cancer. S/p bladder biopsy, bil pyelograms, R ureteroscopy 04/01/12. Pt has daughter that lives with him and is home "most of the time." Daughter broke her leg ~ 1 year ago and she has gait impairments as well. Encouraged pt to use assistive device (at the very least a cane) for ambulation. Recommend HHPT at discharge.     PT Assessment  Patient needs continued PT services    Follow Up Recommendations  Home health PT (if possible). Pt states he just recently used up all his home health days. Home safety eval if HHPT not an option.     Does the patient have the potential to tolerate intense rehabilitation      Barriers to Discharge        Equipment Recommendations  None recommended by PT    Recommendations for Other Services     Frequency Min 3X/week    Precautions / Restrictions Precautions Precautions: Fall Restrictions Weight Bearing Restrictions: No   Pertinent Vitals/Pain Abdomen (hernia)-unrated       Mobility  Bed Mobility Bed Mobility: Supine to Sit Supine to Sit: 7: Independent Transfers Transfers: Sit to Stand;Stand to Sit Sit to Stand: 5: Supervision;From bed Stand to Sit: 5: Supervision;To bed Ambulation/Gait Ambulation/Gait Assistance: 4: Min guard Ambulation Distance (Feet): 225 Feet Ambulation/Gait Assistance Details: Pushing IV pole. Antalgic gait pattern due to chronic knee issues. Encouraged pt to use cane/RW for ambulation at discharge.  Gait Pattern: Step-through pattern;Decreased stride length;Antalgic    Shoulder Instructions     Exercises     PT Diagnosis: Generalized weakness;Abnormality of gait;Difficulty walking  PT Problem List: Decreased strength;Decreased  knowledge of use of DME;Decreased mobility PT Treatment Interventions: DME instruction;Gait training;Stair training;Functional mobility training;Therapeutic activities;Therapeutic exercise;Patient/family education   PT Goals Acute Rehab PT Goals PT Goal Formulation: With patient Time For Goal Achievement: 04/12/12 Potential to Achieve Goals: Good Pt will go Sit to Stand: with modified independence PT Goal: Sit to Stand - Progress: Goal set today Pt will Ambulate: 51 - 150 feet;with modified independence;with least restrictive assistive device PT Goal: Ambulate - Progress: Goal set today Pt will Go Up / Down Stairs: 3-5 stairs;with rail(s) PT Goal: Up/Down Stairs - Progress: Goal set today  Visit Information  Last PT Received On: 04/05/12 Assistance Needed: +1    Subjective Data  Subjective: "I haven't been out of bed since Friday" Patient Stated Goal: Home today   Prior Functioning  Home Living Lives With: Daughter Available Help at Discharge: Family Type of Home: House Home Access: Stairs to enter Secretary/administrator of Steps: 2 + 5 Entrance Stairs-Rails: Right Home Layout: One level Home Adaptive Equipment: Straight cane;Walker - rolling;Shower chair with back Prior Function Level of Independence: Needs assistance Needs Assistance: Dressing;Meal Prep Dressing: Minimal Meal Prep: Minimal Comments: daughter occsionally helps with donning socks if pt doesn't use sock aid. Daughter also helps with grocery shopping and meals at times.  Communication Communication: No difficulties    Cognition  Overall Cognitive Status: Appears within functional limits for tasks assessed/performed Arousal/Alertness: Awake/alert Orientation Level: Appears intact for tasks assessed Behavior During Session: [facility] for tasks performed    Extremity/Trunk Assessment Right Lower Extremity Assessment RLE ROM/Strength/Tone: Deficits RLE ROM/Strength/Tone Deficits: Strength at least 4/5 Left  Lower Extremity Assessment  LLE ROM/Strength/Tone: Deficits LLE ROM/Strength/Tone Deficits: "no cartilage" in L knee. Strength at least 3+/5 Trunk Assessment Trunk Assessment: Normal   Balance    End of Session PT - End of Session Equipment Utilized During Treatment: Gait belt Activity Tolerance: Patient limited by pain Patient left: in chair;with call bell/phone within reach  GP     Rebeca Alert [facility] 04/05/2012, 9:19 AM [phone]

## 2012-04-07 ENCOUNTER — Encounter (HOSPITAL_BASED_OUTPATIENT_CLINIC_OR_DEPARTMENT_OTHER): Payer: Self-pay

## 2012-04-14 ENCOUNTER — Other Ambulatory Visit: Payer: Self-pay | Admitting: Urology

## 2012-04-28 ENCOUNTER — Encounter (HOSPITAL_BASED_OUTPATIENT_CLINIC_OR_DEPARTMENT_OTHER): Payer: Self-pay | Admitting: *Deleted

## 2012-04-28 NOTE — Progress Notes (Signed)
Pt instructed npo p mn 11/10 x lipitor,digoxin,protonix,keppra w sip of water.  To [facility] 11/11 @ 0615.  Needs istat on arrival.ekg, cxr w chart. Dr. Okey Dupre informed of recent pneumonia w last cxr showing stable pneumonia.  No new orders received.  Ok to proceed.

## 2012-04-29 NOTE — H&P (Signed)
History of Present Illness  CIS of the bladder: After an episode of gross hematuria cystoscopy was performed and revealed a bladder tumor. 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  On 06/18/08 he underwent re-resection of the prostate and bladder Bx which showed no evidence of malignancy in any of the prostate specimen but was pos. for high grade CIS in the bladder (left wall).  He underwent a 6 week course of intravesical BCG therapy which he completed in 4/10.  On 11/02/08 repeat cystoscopy with random bladder biopsies revealed no evidence of CIS or papillary tumor.  He was then found to have a positive cytology despite negative cystoscopy in 6/12. He therefore underwent barbotage of both right and left renal pelvis as well as the bladder including retrograde pyelography. No abnormality was noted of the upper tracts on retrograde however there were atypical cells noted from the barbotage of the left renal pelvis and CIS was again identified on the posterior wall of the bladder at the time of biopsy.  He therefore underwent a second 6 week induction course of BCG. He was found to have a positive urine cytology. He therefore underwent evaluation with cystoscopy, bilateral retrograde pyelography, bilateral renal washings, bladder barbotage and bladder biopsy on 04/01/12.  Pathology: Bladder barbotage - slight atypia felt to represent reactive. Bladder biopsy - chronic inflammation without dysplasia or malignancy on all biopsies. Left renal pelvis barbotage - atypia suspicious for malignancy. Right renal pelvis barbotage - marked atypia suspicious for malignancy.  He has a history of an elevated PSA and has been biopsied twice because of that. In 09/1994, his PSA was 7.4. Biopsy revealed BPH and chronic prostatitis. I followed it along and then  biopsied him again in 2000 and that showed BPH with chronic prostatitis only. His PSA has fluctuated over the years, but remained relatively stable.   Organic erectile dysfunction: He wanted to try a vacuum erection device. He has received information about this device and asked if I thought it might be helpful for him so I gave him a prescription for the device.    Past Medical History  Problems  1. History of Acute Cystitis 595.0  2. History of Acute Urinary Retention 788.20  3. History of Arthritis V13.4  4. History of Atrial Fibrillation 427.31  5. History of Benign Prostatic Hypertrophy With Urinary Obstruction 600.01  6. Carcinoma In Situ Of The Bladder 233.7  7. History of Cardiac Devices Pacemaker Present V45.01  8. 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 History of Diabetes Mellitus 250.00  10. History of Gross Hematuria 599.71  11. History of Hypercholesterolemia 272.0  12. History of Hypertension 401.9  13. History of Ischemic Stroke 436  14. History of Sick Sinus Syndrome 427.81  15. History of Skin Cancer V10.83  16. History of Taking High-risk Medication V58.69  17. History of Transitional Cell Carcinoma Of The Bladder V10.51  18. History of Visit For: Exam Following High-risk Medication V67.51  Surgical History  Problems  1. History of Biopsy Of The Prostate Needle  2. History of Biopsy Of The Prostate Needle  3. History of Craniotomy Supratentorial Subdural Hemorrhage Removal  4. History of Cystoscopy With Biopsy  5. History of Cystoscopy With Biopsy  6. History of Cystoscopy With Fulguration Minor Lesion (Under 5mm)  7. History of Cystoscopy With Fulguration Small Lesion (5-57mm)  8. History of Surgery Excision Of Parotid Tumor/Gland  9. History of Transurethral Incision Of Prostate  10. History of Transurethral Resection Of Prostate (TURP)  Current Meds  1. Altace 10 MG TABS; Therapy: (Recorded:17Nov2008) to  2. Ambien 10 MG Oral Tablet; Therapy:  (Recorded:15Nov2007) to  3. Aspirin 81 MG Oral Tablet; Therapy: (Recorded:15Nov2007) to  4. Digoxin 0.25 MG Oral Tablet; Therapy: (Recorded:01Mar2012) to  5. Furosemide 20 MG Oral Tablet; Therapy: 29May2012 to  6. Hydrocodone-Acetaminophen 7.5-500 MG Oral Tablet; Therapy: 23Jan2013 to  7. Klor-Con M10 10 MEQ Oral Tablet Extended Release; Therapy: 02Jul2012 to  8. Lipitor 40 MG Oral Tablet; Therapy: (Recorded:15Nov2007) to  9. Mens One Daily TABS; Therapy: (Recorded:15Nov2007) to  10. Plavix 75 MG Oral Tablet; Therapy: (Recorded:15Nov2007) to  11. Protonix 40 MG Oral Tablet Delayed Release; Therapy: (Recorded:15Nov2007) to  12. Toprol XL 25 MG Oral Tablet Extended Release 24 Hour; Therapy: (Recorded:31May2011) to  13. Triamterene-HCTZ 37.5-25 MG Oral Capsule; Therapy: 13Aug2012 to  Allergies  Medication  1. Codeine Derivatives  2. Doxy-Caps CAPS  Family History  Problems  1. Family history of Family Health Status - Father's Age  53-lung cancer  2. Family history of Family Health Status - Mother's Age  62-stroke  3. Family history of Family Health Status Number Of Children  1 son, 2 daughters  Social History  Problems  1. Alcohol Use  4 drinks/day  2. Caffeine Use  1/day  3. Family history of Death In The Family Father  deceased age 2  4. Family history of Death In The Family Mother  deceased age 87  5. Marital History - Widowed  6. Never A Smoker  7. Occupation:  retired  Denied  8. History of Tobacco Use V15.82  Review of Systems  Genitourinary, constitutional and gastrointestinal system(s) were reviewed and pertinent findings if present are noted.  Genitourinary: erectile dysfunction, but no hematuria.  Gastrointestinal: no flank pain.  Constitutional: no fever.  Vitals  Vital Signs  BMI Calculated: 25.2  BSA Calculated: 1.98  Weight: 176 lb  Blood Pressure: 132 / 46  Temperature: 97.6 F  Heart Rate: 68   Physical Exam:  General appearance: alert and appears  stated age  Head: Normocephalic, without obvious abnormality, atraumatic  Eyes: conjunctivae/corneas clear. EOM's intact.  Oropharynx: moist mucous membranes  Neck: supple, symmetrical, trachea midline  Resp: normal respiratory effort  Cardio: regular rate and rhythm  Back: symmetric, no curvature. ROM normal. No CVA tenderness.  GI: soft, non-tender; bowel sounds normal; no masses, no organomegaly  Male genitalia: penis: normal male phallus, uncirc. with no lesions or discharge.  Testes: bilaterally descended with no masses or tenderness. no hernias  Extremities: extremities normal, atraumatic, no cyanosis or edema  Skin: Skin color normal. No visible rashes or lesions  Neurologic: Grossly normal \  Assessment  Assessed  1. Carcinoma In Situ Of The Bladder 233.7  2. History of Transitional Cell Carcinoma Of The Bladder V10.51  3. Gross Hematuria 599.71  He had some edema of the bladder neck region mainly on the right-hand side but no papillary tumors. The prostatic urethra looked good with no evidence of lesion or active bleeding.  His urine cytology returned showing the presence of malignant urothelial cells. I therefore discussed the need for a repeat cystoscopy with bladder biopsies as well as ureteroscopy bilaterally under anesthesia. We went over the procedure in detail including its risks and complications, limitations and alternatives as well as probabilities of success. He understands and has elected to  proceed.   Plan:  Outpatient cystoscopy probable bladder biopsy, bilateral ureteroscopy and possible renal pelvic or ureteral biopsy as well.Marland Kitchen

## 2012-05-01 NOTE — Anesthesia Preprocedure Evaluation (Addendum)
Anesthesia Evaluation  Patient identified by MRN, date of birth, ID band Patient awake    Reviewed: Allergy & Precautions, H&P , NPO status , Patient's Chart, lab work & pertinent test results, reviewed documented beta blocker date and time   Airway Mallampati: II TM Distance: >3 FB Neck ROM: full    Dental No notable dental hx. (+) Teeth Intact and Dental Advisory Given   Pulmonary neg pulmonary ROS,  breath sounds clear to auscultation  Pulmonary exam normal       Cardiovascular Exercise Tolerance: Good hypertension, Pt. on home beta blockers negative cardio ROS  + dysrhythmias Atrial Fibrillation + pacemaker Rhythm:regular Rate:Normal + Systolic murmurs    Neuro/Psych TIACVA, No Residual Symptoms negative neurological ROS  negative psych ROS   GI/Hepatic negative GI ROS, Neg liver ROS, GERD-  Controlled,  Endo/Other  negative endocrine ROSdiabetes, Well Controlled, Type d/c'd 1 year ago  Renal/GU negative Renal ROS  negative genitourinary   Musculoskeletal   Abdominal   Peds  Hematology negative hematology ROS (+)   Anesthesia Other Findings   Reproductive/Obstetrics negative OB ROS                          Anesthesia Physical Anesthesia Plan  ASA: III  Anesthesia Plan: General   Post-op Pain Management:    Induction: Intravenous  Airway Management Planned: LMA  Additional Equipment:   Intra-op Plan:   Post-operative Plan:   Informed Consent: I have reviewed the patients History and Physical, chart, labs and discussed the procedure including the risks, benefits and alternatives for the proposed anesthesia with the patient or authorized representative who has indicated his/her understanding and acceptance.   Dental Advisory Given  Plan Discussed with: CRNA and Surgeon  Anesthesia Plan Comments:         Anesthesia Quick Evaluation

## 2012-05-02 ENCOUNTER — Encounter (HOSPITAL_BASED_OUTPATIENT_CLINIC_OR_DEPARTMENT_OTHER)
Admission: RE | Disposition: A | Discharge status: Home / Self Care | Payer: Self-pay | Source: Ambulatory Visit | Attending: Urology

## 2012-05-02 ENCOUNTER — Ambulatory Visit (HOSPITAL_BASED_OUTPATIENT_CLINIC_OR_DEPARTMENT_OTHER): Payer: Medicare Other | Admitting: Anesthesiology

## 2012-05-02 ENCOUNTER — Ambulatory Visit (HOSPITAL_BASED_OUTPATIENT_CLINIC_OR_DEPARTMENT_OTHER)
Admission: RE | Admit: 2012-05-02 | Discharge: 2012-05-02 | Disposition: A | Discharge status: Home / Self Care | Payer: Medicare Other | Source: Ambulatory Visit | Attending: Urology | Admitting: Urology

## 2012-05-02 ENCOUNTER — Encounter (HOSPITAL_BASED_OUTPATIENT_CLINIC_OR_DEPARTMENT_OTHER): Payer: Self-pay | Admitting: Anesthesiology

## 2012-05-02 ENCOUNTER — Encounter (HOSPITAL_BASED_OUTPATIENT_CLINIC_OR_DEPARTMENT_OTHER): Payer: Self-pay | Admitting: *Deleted

## 2012-05-02 DIAGNOSIS — E7800 Pure hypercholesterolemia, unspecified: Secondary | ICD-10-CM | POA: Insufficient documentation

## 2012-05-02 DIAGNOSIS — Z950 Presence of cardiac pacemaker: Secondary | ICD-10-CM | POA: Insufficient documentation

## 2012-05-02 DIAGNOSIS — Z7982 Long term (current) use of aspirin: Secondary | ICD-10-CM | POA: Insufficient documentation

## 2012-05-02 DIAGNOSIS — R82998 Other abnormal findings in urine: Secondary | ICD-10-CM | POA: Insufficient documentation

## 2012-05-02 DIAGNOSIS — N138 Other obstructive and reflux uropathy: Secondary | ICD-10-CM | POA: Insufficient documentation

## 2012-05-02 DIAGNOSIS — Z79899 Other long term (current) drug therapy: Secondary | ICD-10-CM | POA: Insufficient documentation

## 2012-05-02 DIAGNOSIS — Z85828 Personal history of other malignant neoplasm of skin: Secondary | ICD-10-CM | POA: Insufficient documentation

## 2012-05-02 DIAGNOSIS — E119 Type 2 diabetes mellitus without complications: Secondary | ICD-10-CM | POA: Insufficient documentation

## 2012-05-02 DIAGNOSIS — Z8673 Personal history of transient ischemic attack (TIA), and cerebral infarction without residual deficits: Secondary | ICD-10-CM | POA: Insufficient documentation

## 2012-05-02 DIAGNOSIS — D090 Carcinoma in situ of bladder: Secondary | ICD-10-CM | POA: Insufficient documentation

## 2012-05-02 DIAGNOSIS — I4891 Unspecified atrial fibrillation: Secondary | ICD-10-CM | POA: Insufficient documentation

## 2012-05-02 DIAGNOSIS — I10 Essential (primary) hypertension: Secondary | ICD-10-CM | POA: Insufficient documentation

## 2012-05-02 DIAGNOSIS — N401 Enlarged prostate with lower urinary tract symptoms: Secondary | ICD-10-CM | POA: Insufficient documentation

## 2012-05-02 DIAGNOSIS — R8289 Other abnormal findings on cytological and histological examination of urine: Secondary | ICD-10-CM | POA: Diagnosis present

## 2012-05-02 HISTORY — DX: Gastro-esophageal reflux disease without esophagitis: K21.9

## 2012-05-02 HISTORY — PX: CYSTOSCOPY WITH BIOPSY: SHX5122

## 2012-05-02 HISTORY — PX: CYSTOSCOPY WITH STENT PLACEMENT: SHX5790

## 2012-05-02 HISTORY — PX: CYSTOSCOPY/RETROGRADE/URETEROSCOPY: SHX5316

## 2012-05-02 LAB — POCT I-STAT 4, (NA,K, GLUC, HGB,HCT)
Glucose, Bld: 97 mg/dL (ref 70–99)
HCT: 43 % (ref 39.0–52.0)
Hemoglobin: 14.6 g/dL (ref 13.0–17.0)
Potassium: 3.9 mEq/L (ref 3.5–5.1)
Sodium: 141 mEq/L (ref 135–145)

## 2012-05-02 SURGERY — CYSTOSCOPY/RETROGRADE/URETEROSCOPY
Anesthesia: General | Site: Ureter | Wound class: Clean Contaminated

## 2012-05-02 MED ORDER — LACTATED RINGERS IV SOLN
INTRAVENOUS | Status: DC
  Filled 2012-05-02: qty 1000

## 2012-05-02 MED ORDER — FENTANYL CITRATE 0.05 MG/ML IJ SOLN
INTRAMUSCULAR | Status: DC | PRN
  Administered 2012-05-02 (×8): 25 ug via INTRAVENOUS

## 2012-05-02 MED ORDER — CIPROFLOXACIN IN D5W 200 MG/100ML IV SOLN
200.0000 mg | INTRAVENOUS | Status: AC
  Administered 2012-05-02: 200 mg via INTRAVENOUS
  Filled 2012-05-02: qty 100

## 2012-05-02 MED ORDER — HYDROCODONE-ACETAMINOPHEN 7.5-325 MG PO TABS
1.0000 | ORAL_TABLET | Freq: Four times a day (QID) | ORAL | Status: DC | PRN
  Administered 2012-05-02: 1 via ORAL
  Filled 2012-05-02: qty 1

## 2012-05-02 MED ORDER — LIDOCAINE HCL (CARDIAC) 20 MG/ML IV SOLN
INTRAVENOUS | Status: DC | PRN
  Administered 2012-05-02: 75 mg via INTRAVENOUS

## 2012-05-02 MED ORDER — STERILE WATER FOR IRRIGATION IR SOLN
Status: DC | PRN
  Administered 2012-05-02: 3000 mL

## 2012-05-02 MED ORDER — LACTATED RINGERS IV SOLN
INTRAVENOUS | Status: DC
  Administered 2012-05-02 (×3): via INTRAVENOUS
  Filled 2012-05-02: qty 1000

## 2012-05-02 MED ORDER — PHENAZOPYRIDINE HCL 200 MG PO TABS
200.0000 mg | ORAL_TABLET | Freq: Once | ORAL | Status: AC
  Administered 2012-05-02: 200 mg via ORAL
  Filled 2012-05-02: qty 1

## 2012-05-02 MED ORDER — BELLADONNA ALKALOIDS-OPIUM 16.2-60 MG RE SUPP
RECTAL | Status: DC | PRN
  Administered 2012-05-02: 1 via RECTAL

## 2012-05-02 MED ORDER — PROPOFOL 10 MG/ML IV BOLUS
INTRAVENOUS | Status: DC | PRN
  Administered 2012-05-02: 150 mg via INTRAVENOUS
  Administered 2012-05-02: 20 mg via INTRAVENOUS

## 2012-05-02 MED ORDER — LIDOCAINE HCL 2 % EX GEL
CUTANEOUS | Status: DC | PRN
  Administered 2012-05-02: 1

## 2012-05-02 MED ORDER — SODIUM CHLORIDE 0.9 % IR SOLN
Status: DC | PRN
  Administered 2012-05-02: 6000 mL

## 2012-05-02 MED ORDER — FENTANYL CITRATE 0.05 MG/ML IJ SOLN
25.0000 ug | INTRAMUSCULAR | Status: DC | PRN
  Filled 2012-05-02: qty 1

## 2012-05-02 MED ORDER — HYDROCODONE-ACETAMINOPHEN 7.5-325 MG PO TABS: 1.0000 | ORAL_TABLET | ORAL | Status: DC | PRN

## 2012-05-02 MED ORDER — ONDANSETRON HCL 4 MG/2ML IJ SOLN
INTRAMUSCULAR | Status: DC | PRN
  Administered 2012-05-02: 4 mg via INTRAVENOUS

## 2012-05-02 MED ORDER — IOHEXOL 350 MG/ML SOLN
INTRAVENOUS | Status: DC | PRN
  Administered 2012-05-02: 20 mL

## 2012-05-02 MED ORDER — PHENAZOPYRIDINE HCL 200 MG PO TABS: 200.0000 mg | ORAL_TABLET | Freq: Three times a day (TID) | ORAL | Status: DC | PRN

## 2012-05-02 SURGICAL SUPPLY — 46 items
ADAPTER CATH URET PLST 4-6FR (CATHETERS) IMPLANT
ADPR CATH URET STRL DISP 4-6FR (CATHETERS)
BAG DRAIN URO-CYSTO SKYTR STRL (DRAIN) ×3 IMPLANT
BAG DRN UROCATH (DRAIN) ×2
BASKET LASER NITINOL 1.9FR (BASKET) IMPLANT
BASKET SEGURA 3FR (UROLOGICAL SUPPLIES) IMPLANT
BASKET STNLS GEMINI 4WIRE 3FR (BASKET) IMPLANT
BASKET ZERO TIP NITINOL 2.4FR (BASKET) IMPLANT
BRUSH URET BIOPSY 3F (UROLOGICAL SUPPLIES) ×4 IMPLANT
BSKT STON RTRVL 120 1.9FR (BASKET)
BSKT STON RTRVL GEM 120X11 3FR (BASKET)
BSKT STON RTRVL ZERO TP 2.4FR (BASKET)
CANISTER SUCT LVC 12 LTR MEDI- (MISCELLANEOUS) ×1 IMPLANT
CATH INTERMIT  6FR 70CM (CATHETERS) ×2 IMPLANT
CATH URET 5FR 28IN CONE TIP (BALLOONS)
CATH URET 5FR 70CM CONE TIP (BALLOONS) IMPLANT
CLOTH BEACON ORANGE TIMEOUT ST (SAFETY) ×3 IMPLANT
CONT SPEC 4OZ CLIKSEAL STRL BL (MISCELLANEOUS) ×3 IMPLANT
DRAPE CAMERA CLOSED 9X96 (DRAPES) ×3 IMPLANT
ELECT REM PT RETURN 9FT ADLT (ELECTROSURGICAL)
ELECTRODE REM PT RTRN 9FT ADLT (ELECTROSURGICAL) IMPLANT
GLOVE BIO SURGEON STRL SZ8 (GLOVE) ×3 IMPLANT
GLOVE BIOGEL PI IND STRL 7.0 (GLOVE) IMPLANT
GLOVE BIOGEL PI INDICATOR 7.0 (GLOVE) ×2
GLOVE ECLIPSE 7.5 STRL STRAW (GLOVE) ×1 IMPLANT
GOWN STRL REIN XL XLG (GOWN DISPOSABLE) ×3 IMPLANT
GOWN XL W/COTTON TOWEL STD (GOWNS) ×3 IMPLANT
GUIDEWIRE 0.038 PTFE COATED (WIRE) ×3 IMPLANT
GUIDEWIRE ANG ZIPWIRE 038X150 (WIRE) IMPLANT
GUIDEWIRE STR DUAL SENSOR (WIRE) ×1 IMPLANT
IV NS IRRIG 3000ML ARTHROMATIC (IV SOLUTION) ×6 IMPLANT
KIT BALLIN UROMAX 15FX10 (LABEL) IMPLANT
KIT BALLN UROMAX 15FX4 (MISCELLANEOUS) IMPLANT
KIT BALLN UROMAX 26 75X4 (MISCELLANEOUS)
LASER FIBER DISP (UROLOGICAL SUPPLIES) IMPLANT
LASER FIBER DISP 1000U (UROLOGICAL SUPPLIES) IMPLANT
NEEDLE HYPO 22GX1.5 SAFETY (NEEDLE) ×1 IMPLANT
NS IRRIG 500ML POUR BTL (IV SOLUTION) ×1 IMPLANT
PACK CYSTOSCOPY (CUSTOM PROCEDURE TRAY) ×3 IMPLANT
SET HIGH PRES BAL DIL (LABEL)
SHEATH ACCESS URETERAL 38CM (SHEATH) ×1 IMPLANT
SHEATH ACCESS URETERAL 54CM (SHEATH) IMPLANT
STENT URET 6FRX24 CONTOUR (STENTS) ×2 IMPLANT
SYRINGE 10CC LL (SYRINGE) ×2 IMPLANT
SYRINGE IRR TOOMEY STRL 70CC (SYRINGE) ×1 IMPLANT
WATER STERILE IRR 3000ML UROMA (IV SOLUTION) ×1 IMPLANT

## 2012-05-02 NOTE — Interval H&P Note (Signed)
History and Physical Interval Note:  05/02/2012 6:59 AM  [name]  has presented today for surgery, with the diagnosis of ABNORMAL CYTOLOGY HX OF BLADDER   The various methods of treatment have been discussed with the patient and family. After consideration of risks, benefits and other options for treatment, the patient has consented to  Procedure(s) (LRB) with comments: CYSTOSCOPY/RETROGRADE/URETEROSCOPY (Bilateral) - CYSTOSCOPY BILATERAL RETROGRADE PYLOGRAM BILATERAL URETEROSCOPY AND BIOPSY  rad tech ok per rick  as a surgical intervention .  The patient's history has been reviewed, patient examined, no change in status, stable for surgery.  I have reviewed the patient's chart and labs.  Questions were answered to the patient's satisfaction.     Garnett Farm

## 2012-05-02 NOTE — Anesthesia Procedure Notes (Signed)
Procedure Name: LMA Insertion Date/Time: 05/02/2012 7:34 AM Performed by: Fran Lowes Pre-anesthesia Checklist: Patient identified, Emergency Drugs available, Suction available and Patient being monitored Patient Re-evaluated:Patient Re-evaluated prior to inductionOxygen Delivery Method: Circle System Utilized Preoxygenation: Pre-oxygenation with 100% oxygen Intubation Type: IV induction Ventilation: Mask ventilation without difficulty LMA: LMA inserted LMA Size: 4.0 Number of attempts: 1 Airway Equipment and Method: bite block Placement Confirmation: positive ETCO2 Tube secured with: Tape Dental Injury: Teeth and Oropharynx as per pre-operative assessment

## 2012-05-02 NOTE — Transfer of Care (Signed)
Immediate Anesthesia Transfer of Care Note  Patient: [name]  Procedure(s) Performed: Procedure(s) (LRB): CYSTOSCOPY/RETROGRADE/URETEROSCOPY (Bilateral) CYSTOSCOPY WITH BIOPSY (N/A) CYSTOSCOPY WITH STENT PLACEMENT (Bilateral)  Patient Location: Patient transported to PACU with oxygen via face mask at 4 Liters / Min  Anesthesia Type: General  Level of Consciousness: awake and alert   Airway & Oxygen Therapy: Patient Spontanous Breathing and Patient connected to face mask oxygen  Post-op Assessment: Report given to PACU RN and Post -op Vital signs reviewed and stable  Post vital signs: Reviewed and stable  Dentition: Teeth and oropharynx remain in pre-op condition  Complications: No apparent anesthesia complications

## 2012-05-02 NOTE — Op Note (Signed)
PATIENT:  [name]  PRE-OPERATIVE DIAGNOSIS: 1. Urine cytology positive for malignant urothelial cells. 2. History of CIS of the bladder.  POST-OPERATIVE DIAGNOSIS:  Same  PROCEDURE:  Procedure(s): 1. Cystoscopy with bladder biopsy 2. Left ureteroscopy and biopsy 3. Right ureteroscopy and biopsy 4. Bilateral double-J stent placement  SURGEON:  Garnett Farm  INDICATION: Mr. [name] is an 78 year old male who presented after undergoing a 6 week course of BCG for a history of CIS. His urine was sent for cytology and returned positive for malignant cells. I found no abnormality in his bladder at the time of his cystoscopy in 6/12. On 10/13 he underwent bladder biopsies, bladder barbotage, left and right renal pelvis barbotage and I found no abnormality on his retrograde pyelography on either side. His pathology revealed atypia suspicious for malignancy on the left side and marked atypia suspicious for malignancy on the right side with slight atypia and negative bladder biopsies. He is brought back to the operating room for further investigation of the source of his positive cytology.  ANESTHESIA:  General  EBL:  Minimal  DRAINS: 6 French, 24 cm double-J stent in the right and left ureter (right stent has double string and left stent has single string)  LOCAL MEDICATIONS USED:  Urojet local anesthetic in the urethra  SPECIMEN:  1. Bladder barbotage for cytology 2. Cold cup biopsy of the bladder 3. Brush biopsy of the left renal pelvis with barbotage 4. Brush biopsy of the right renal pelvis with barbotage 5. Brush biopsy of the left distal ureter with barbotage 6. Brush biopsy of the right distal ureter with barbotage 7. Biopsy of prostatic urethra  Description of procedure: After informed consent the patient was taken to the major or, placed the table Mr. general anesthesia and then moved the dorsal lithotomy position. His genitalia was sterilely prepped and draped and an  official timeout was then performed.  Cystoscopy was performed with the 21 French cystoscope and 12 lens. Ureter was noted be normal. The prostatic urethra revealed no lesions. The bladder was entered and multiple previous biopsy scars were noted. The left ureteral orifice appeared normal the right ureteral orifice had some associated edema. I used both 70 and 12 lenses to inspect the bladder and no tumors or worrisome areas were identified.  A 6 French open-ended ureteral catheter was passed through the cystoscope and into the left distal ureter. A 0.038 inch floppy-tipped guidewire was then passed into the area the renal pelvis and the open-ended catheter was advanced about three quarters of the way up the ureter and left in place. This was then performed on the right side in an identical fashion. With 6 [place] open-ended ureteral stents on both sides diverting the urine from the upper tract I irrigated the bladder with saline and then obtained a barbotage specimen of the bladder which were sent to pathology. I then obtained a cold cup biopsy of the bladder and this site was fulgurated.  Through the open-ended catheter I passed a guidewire up into the left kidney. I then removed the open-ended catheter and passed a ureteral access sheath inner portion to gently dilate the intramural ureter after which I was able to pass the ureteroscope over the guidewire and into the area the renal pelvis. The guidewire was removed and the renal pelvis and each calyx was individually visualized but no abnormality was noted. I used NBI light and again noted no significant abnormalities. There was some irritation of the renal pelvis from the  guidewire. I used a brush to obtain a brush specimen from the left renal pelvis. I then used saline and barbotaged in the left renal pelvis and the barbotage specimen as well as the brush were sent for cytology. I then advanced the scope down the ureter under direct vision using NBI and  again noted no significant abnormality. There was some slight difference in the mucosa in the distal ureter but I think that was probably from where the stent and guidewire had traversed. Regardless I used the brush biopsy and obtained a brush from this location and then sent barbotaged urine from the left ureter. I then reinserted the guidewire and left this in place.  I then performed an identical procedure on the right-hand side. The findings on the right-hand side were again unremarkable for any significant papillary lesions or worrisome erythematous mucosal regions. I obtained brush biopsies and barbotaged specimens from the right side in an identical fashion to the left side. I also noted what appeared to be some slight irritation of the right distal ureter but this also was felt to be due to the passage of the open-ended catheter and guidewire. I left the guidewire in place on the right-hand side.  The cystoscope was backloaded over the guidewire and I placed the stent on the right-hand side under direct fluoroscopic control and then on the left-hand side in an identical fashion. This revealed good curl in the renal pelvis on both sides and good curl in the bladder. The bladder was then drained, Urojet local anesthetic was instilled in the urethra and penile clamp was applied. The strings were affixed to the dorsum of the penis and the patient received a B&O suppository. He was then awakened and taken to the recovery room in stable and satisfactory condition. Tolerated the procedure well no intraoperative complications.    PLAN OF CARE:   Discharge to home after PACU  PATIENT DISPOSITION:  PACU - hemodynamically stable.

## 2012-05-02 NOTE — Anesthesia Postprocedure Evaluation (Signed)
  Anesthesia Post-op Note  Patient: [name]  Procedure(s) Performed: Procedure(s) (LRB): CYSTOSCOPY/RETROGRADE/URETEROSCOPY (Bilateral) CYSTOSCOPY WITH BIOPSY (N/A) CYSTOSCOPY WITH STENT PLACEMENT (Bilateral)  Patient Location: PACU  Anesthesia Type: General  Level of Consciousness: awake and alert   Airway and Oxygen Therapy: Patient Spontanous Breathing  Post-op Pain: mild  Post-op Assessment: Post-op Vital signs reviewed, Patient's Cardiovascular Status Stable, Respiratory Function Stable, Patent Airway and No signs of Nausea or vomiting  Post-op Vital Signs: stable  Complications: No apparent anesthesia complications

## 2012-05-03 ENCOUNTER — Encounter (HOSPITAL_BASED_OUTPATIENT_CLINIC_OR_DEPARTMENT_OTHER): Payer: Self-pay | Admitting: Urology

## 2012-05-05 ENCOUNTER — Encounter (HOSPITAL_BASED_OUTPATIENT_CLINIC_OR_DEPARTMENT_OTHER): Payer: Self-pay

## 2012-07-05 ENCOUNTER — Encounter: Payer: Self-pay | Admitting: Radiation Oncology

## 2012-07-07 ENCOUNTER — Encounter: Payer: Self-pay | Admitting: Radiation Oncology

## 2012-07-07 ENCOUNTER — Ambulatory Visit
Admission: RE | Admit: 2012-07-07 | Discharge: 2012-07-07 | Disposition: A | Discharge status: Home / Self Care | Payer: Medicare Other | Source: Ambulatory Visit | Attending: Radiation Oncology | Admitting: Radiation Oncology

## 2012-07-07 VITALS — BP 190/97 | HR 72 | Temp 97.7°F | Resp 20 | Wt 186.2 lb

## 2012-07-07 DIAGNOSIS — C07 Malignant neoplasm of parotid gland: Secondary | ICD-10-CM

## 2012-07-07 NOTE — Progress Notes (Signed)
  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  He underwent cystoscopy for positive urine cytology on November 11.  He was also admitted for pneumonia October 13-15th.    ALLERGIES:  is allergic to codeine and docetaxel.  Meds: Current Outpatient Prescriptions  Medication Sig Dispense Refill  . acetaminophen (TYLENOL) 500 MG tablet Take 500 mg by mouth every 6 (six) hours as needed.      Marland Kitchen atorvastatin (LIPITOR) 40 MG tablet Take 40 mg by mouth daily.       . digoxin (LANOXIN) 0.25 MG tablet Take 0.125 mg by mouth daily. Takes 1/2 tab      . metoprolol succinate (TOPROL-XL) 25 MG 24 hr tablet Take 25 mg by mouth daily.      . ramipril (ALTACE) 10 MG capsule Take 10 mg by mouth daily.      Marland Kitchen zolpidem (AMBIEN) 10 MG tablet Take 10 mg by mouth at bedtime as needed. For sleep      . folic acid (FOLVITE) 400 MCG tablet Take 400 mcg by mouth daily.      . phenazopyridine (PYRIDIUM) 200 MG tablet Take 1 tablet (200 mg total) by mouth 3 (three) times daily as needed for pain.  30 tablet  0    Physical Findings: The patient is in no acute distress. Patient is Alert,oriented x3, HOH,  weight is 186 lb 3.2 oz (84.46 kg). His oral temperature is 97.7 F (36.5 C). His blood pressure is 190/97 and his pulse is 72. His respiration is 20 and oxygen saturation is 98%. . The patient's parotid regions are carefully palpated bilaterally and there is no nodularity or abnormality noted. There is no appreciable lymphadenopathy in the facial nodes or neck or  supraclavicular region. Cranial nerves are grossly intact. Oral cavity contains good dentition with no significant mucosal records. Within the right tonsil, there is some central nodularity measuring up to 1 cm in size with no overlying mucosal change suggestive of follicular hypertrophy. The remainder of the posterior oropharynx and larynx appear unremarkable on indirect meter or exam. He does have slow steady gait,favors left leg, stated "bone on bone,no cartillage." No significant changes  Impression:  The patient is recovering from the effects of radiation.  He has no evidence of recurrence at 5 years.  Plan:  He will continue to follow-up with his primary care physician for ongoing medical management. It may be reasonable to perform TSH and chest x-ray annually given his history of cancer.  I will look forward to following his progress through correspondence, and be happy to participate in care if clinically indicated.  I talked to the patient about what to expect in the future.  I encouraged him to call or return to the office if he has any question about his previous radiation or possible radiation effects.  He was comfortable with this plan.   _____________________________________  Artist Pais. Kathrynn Running, M.D.

## 2012-07-07 NOTE — Progress Notes (Signed)
Patient here follow up s/p radiation 05/05/07-06/15/07 right parotid/supraclavicular region Alert,oriented x3, HOH, no c/o nausea, no difficulty swallowing, does have slow steady gait,favors left leg, stated "bone on bone,no cartillage, " not interested in knee replacement sugessted, recent  Cystoscopy with biopsy 05/02/12 Dr. [name] b/l stents were  Placed, and  were removed 05/12/12 Patient sees Dr. [name] every 90 days,  Pain in left knee a 6-7 on a 1-10 scale, "it doesn't hurt till I put weight on it", no dysuria no hematuria, regular bowel movements 10:29 AM  Last TSH listing 01/07/12=2.582

## 2012-09-06 ENCOUNTER — Other Ambulatory Visit (HOSPITAL_COMMUNITY): Payer: Self-pay | Admitting: Cardiovascular Disease

## 2012-09-06 DIAGNOSIS — I340 Nonrheumatic mitral (valve) insufficiency: Secondary | ICD-10-CM

## 2012-09-13 ENCOUNTER — Ambulatory Visit (HOSPITAL_COMMUNITY): Payer: Medicare Other

## 2012-09-20 ENCOUNTER — Ambulatory Visit (HOSPITAL_COMMUNITY)
Admission: RE | Admit: 2012-09-20 | Discharge: 2012-09-20 | Disposition: A | Discharge status: Home / Self Care | Payer: Medicare Other | Source: Ambulatory Visit | Attending: Cardiovascular Disease | Admitting: Cardiovascular Disease

## 2012-09-20 DIAGNOSIS — Z950 Presence of cardiac pacemaker: Secondary | ICD-10-CM

## 2012-09-20 DIAGNOSIS — I340 Nonrheumatic mitral (valve) insufficiency: Secondary | ICD-10-CM

## 2012-09-20 DIAGNOSIS — I4891 Unspecified atrial fibrillation: Secondary | ICD-10-CM

## 2012-09-20 DIAGNOSIS — E785 Hyperlipidemia, unspecified: Secondary | ICD-10-CM | POA: Insufficient documentation

## 2012-09-20 DIAGNOSIS — I080 Rheumatic disorders of both mitral and aortic valves: Secondary | ICD-10-CM | POA: Insufficient documentation

## 2012-09-20 DIAGNOSIS — I079 Rheumatic tricuspid valve disease, unspecified: Secondary | ICD-10-CM | POA: Insufficient documentation

## 2012-09-20 HISTORY — PX: CARDIAC CATHETERIZATION: SHX172

## 2012-09-20 NOTE — Progress Notes (Signed)
2D Echo Performed 09/20/2012    [name], RCS  

## 2012-12-07 ENCOUNTER — Telehealth: Payer: Self-pay | Admitting: Medical Oncology

## 2012-12-07 NOTE — Telephone Encounter (Signed)
I called pt and spoke with his daughter Roanna Raider to see how he is doing. His last visit here to see Dr. Arline Asp   was 05/19/11.  He did follow up with Dr. Kathrynn Running 07/07/2012.She states he has been doing well except issues with his bladder cancer. I asked her if he has had his port flushed and she is not sure. Per Dr. Kathrynn Running pt was referred back to his primary and if needed we will be happy to see pt.  Per Dr. Arline Asp he can have his port  removed if he would like. She is going to talk with him and call me back with his decision to remove port.

## 2013-02-26 ENCOUNTER — Emergency Department (HOSPITAL_COMMUNITY): Payer: Medicare Other

## 2013-02-26 ENCOUNTER — Emergency Department (HOSPITAL_COMMUNITY)
Admission: EM | Admit: 2013-02-26 | Discharge: 2013-02-26 | Disposition: A | Discharge status: Home / Self Care | Payer: Medicare Other | Attending: Emergency Medicine | Admitting: Emergency Medicine

## 2013-02-26 ENCOUNTER — Encounter (HOSPITAL_COMMUNITY): Payer: Self-pay | Admitting: *Deleted

## 2013-02-26 DIAGNOSIS — M129 Arthropathy, unspecified: Secondary | ICD-10-CM | POA: Insufficient documentation

## 2013-02-26 DIAGNOSIS — I4891 Unspecified atrial fibrillation: Secondary | ICD-10-CM | POA: Insufficient documentation

## 2013-02-26 DIAGNOSIS — N189 Chronic kidney disease, unspecified: Secondary | ICD-10-CM | POA: Insufficient documentation

## 2013-02-26 DIAGNOSIS — M1990 Unspecified osteoarthritis, unspecified site: Secondary | ICD-10-CM | POA: Insufficient documentation

## 2013-02-26 DIAGNOSIS — Z8719 Personal history of other diseases of the digestive system: Secondary | ICD-10-CM | POA: Insufficient documentation

## 2013-02-26 DIAGNOSIS — E785 Hyperlipidemia, unspecified: Secondary | ICD-10-CM | POA: Insufficient documentation

## 2013-02-26 DIAGNOSIS — IMO0002 Reserved for concepts with insufficient information to code with codable children: Secondary | ICD-10-CM | POA: Insufficient documentation

## 2013-02-26 DIAGNOSIS — K219 Gastro-esophageal reflux disease without esophagitis: Secondary | ICD-10-CM | POA: Insufficient documentation

## 2013-02-26 DIAGNOSIS — E119 Type 2 diabetes mellitus without complications: Secondary | ICD-10-CM | POA: Insufficient documentation

## 2013-02-26 DIAGNOSIS — Z8669 Personal history of other diseases of the nervous system and sense organs: Secondary | ICD-10-CM | POA: Insufficient documentation

## 2013-02-26 DIAGNOSIS — Z923 Personal history of irradiation: Secondary | ICD-10-CM | POA: Insufficient documentation

## 2013-02-26 DIAGNOSIS — F1010 Alcohol abuse, uncomplicated: Secondary | ICD-10-CM | POA: Insufficient documentation

## 2013-02-26 DIAGNOSIS — Z950 Presence of cardiac pacemaker: Secondary | ICD-10-CM | POA: Insufficient documentation

## 2013-02-26 DIAGNOSIS — R011 Cardiac murmur, unspecified: Secondary | ICD-10-CM | POA: Insufficient documentation

## 2013-02-26 DIAGNOSIS — Y939 Activity, unspecified: Secondary | ICD-10-CM | POA: Insufficient documentation

## 2013-02-26 DIAGNOSIS — Y929 Unspecified place or not applicable: Secondary | ICD-10-CM | POA: Insufficient documentation

## 2013-02-26 DIAGNOSIS — Z87448 Personal history of other diseases of urinary system: Secondary | ICD-10-CM | POA: Insufficient documentation

## 2013-02-26 DIAGNOSIS — Z8551 Personal history of malignant neoplasm of bladder: Secondary | ICD-10-CM | POA: Insufficient documentation

## 2013-02-26 DIAGNOSIS — Z8673 Personal history of transient ischemic attack (TIA), and cerebral infarction without residual deficits: Secondary | ICD-10-CM | POA: Insufficient documentation

## 2013-02-26 DIAGNOSIS — W19XXXA Unspecified fall, initial encounter: Secondary | ICD-10-CM | POA: Insufficient documentation

## 2013-02-26 DIAGNOSIS — Z9889 Other specified postprocedural states: Secondary | ICD-10-CM | POA: Insufficient documentation

## 2013-02-26 DIAGNOSIS — I129 Hypertensive chronic kidney disease with stage 1 through stage 4 chronic kidney disease, or unspecified chronic kidney disease: Secondary | ICD-10-CM | POA: Insufficient documentation

## 2013-02-26 DIAGNOSIS — Z85858 Personal history of malignant neoplasm of other endocrine glands: Secondary | ICD-10-CM | POA: Insufficient documentation

## 2013-02-26 DIAGNOSIS — H9190 Unspecified hearing loss, unspecified ear: Secondary | ICD-10-CM | POA: Insufficient documentation

## 2013-02-26 LAB — COMPREHENSIVE METABOLIC PANEL
ALT: 12 U/L (ref 0–53)
AST: 26 U/L (ref 0–37)
Albumin: 3.4 g/dL — ABNORMAL LOW (ref 3.5–5.2)
CO2: 25 mEq/L (ref 19–32)
Calcium: 9.5 mg/dL (ref 8.4–10.5)
Chloride: 91 mEq/L — ABNORMAL LOW (ref 96–112)
GFR calc non Af Amer: 54 mL/min — ABNORMAL LOW (ref >90)
Sodium: 129 mEq/L — ABNORMAL LOW (ref 135–145)
Total Bilirubin: 1.5 mg/dL — ABNORMAL HIGH (ref 0.3–1.2)

## 2013-02-26 LAB — CBC WITH DIFFERENTIAL/PLATELET
Basophils Absolute: 0 10*3/uL (ref 0.0–0.1)
Basophils Relative: 1 % (ref 0–1)
Lymphocytes Relative: 16 % (ref 12–46)
MCHC: 35.1 g/dL (ref 30.0–36.0)
Neutro Abs: 4.5 10*3/uL (ref 1.7–7.7)
Platelets: 176 10*3/uL (ref 150–400)
RDW: 14.4 % (ref 11.5–15.5)
WBC: 6.3 10*3/uL (ref 4.0–10.5)

## 2013-02-26 LAB — ETHANOL: Alcohol, Ethyl (B): <11 mg/dL (ref 0–11)

## 2013-02-26 LAB — RAPID URINE DRUG SCREEN, HOSP PERFORMED
Amphetamines: NOT DETECTED
Benzodiazepines: NOT DETECTED
Opiates: NOT DETECTED
Tetrahydrocannabinol: NOT DETECTED

## 2013-02-26 MED ORDER — THIAMINE HCL 100 MG/ML IJ SOLN
100.0000 mg | Freq: Once | INTRAMUSCULAR | Status: AC
  Administered 2013-02-26: 100 mg via INTRAVENOUS
  Filled 2013-02-26: qty 2

## 2013-02-26 MED ORDER — SODIUM CHLORIDE 0.9 % IV BOLUS (SEPSIS)
500.0000 mL | Freq: Once | INTRAVENOUS | Status: AC
  Administered 2013-02-26: 500 mL via INTRAVENOUS

## 2013-02-26 MED ORDER — CHLORDIAZEPOXIDE HCL 25 MG PO CAPS: 25.0000 mg | ORAL_CAPSULE | Freq: Three times a day (TID) | ORAL | Status: DC | PRN

## 2013-02-26 MED ORDER — LORAZEPAM 2 MG/ML IJ SOLN
1.0000 mg | Freq: Once | INTRAMUSCULAR | Status: AC
  Administered 2013-02-26: 1 mg via INTRAVENOUS
  Filled 2013-02-26: qty 1

## 2013-02-26 NOTE — ED Notes (Signed)
Pt daugther reports pt lives with her, pt has ETOH abuse x30 years, 1/5 a day. Pt also is detoxing off ambien. Pt denies SI/HI, AH/VH. dauther also reports pt has hygiene issues. Daughter reports pt fell a few weeks ago, wants a chest xray to see if pt broke any ribs. Pt walks with a walker, hx of stroke. Pain lower middle of back 5/10 at present.

## 2013-02-26 NOTE — ED Notes (Signed)
He states he is here to detox. From alcohol.  He states his most recent drink was at noon today.  He is a bit shaky and in no distress.

## 2013-02-26 NOTE — ED Notes (Signed)
Bed: WA11 Expected date:  Expected time:  Means of arrival:  Comments: triage 

## 2013-02-26 NOTE — ED Provider Notes (Signed)
CSN: [id number]     Arrival date & time 02/26/13  1609 History  First MD Initiated Contact with Patient 02/26/13 1617     Chief Complaint  Patient presents with  . Medical Clearance   HPI The patient presents to the emergency room asking for help with his alcohol abuse. Patient states he has been drinking alcohol for years.  Patient states he drinks primarily alcohol, at least 3 shots per day times he is going through a lot more.  Family is concerned about his drinking. They brought him to the emergency department for help.  Patient denies any nausea or vomiting. He did have a fall a few weeks ago and has had persistent lower back pain since that time. He denies any fevers or other complaints. Past Medical History  Diagnosis Date  . Atrial fibrillation 1990  . Dyslipidemia   . Prostate hypertrophy   . Arthritis     degenerative-knees  . History of radiation therapy 05/05/07-06/15/07    right parotid through subclavicular region/ mid jugularlymph node chain  . Allergy   . Hypertension     labile  . Heart murmur     2/6 systolic  . History of chemotherapy      Hx carboplatin/56fu  . Xerostomia     limited  . Stroke 2003    TIA  . Bladder cancer 2010  . Cancer 05/05/07-06/15/07    parotid gland/66.4 GY  . HOH (hard of hearing)   . Sleep apnea     Not diagnosied; pt denies 03/29/12  . Pacemaker     2003 vent paced , rate 69  . Chronic kidney disease     positive cytology  . Dry eyes   . Diabetes mellitus without complication     meds d/c  1 year ago  . GERD (gastroesophageal reflux disease)   . History of radiation therapy 05/05/07-06/15/07    Right parotid/hemineck,supraclavicular   Past Surgical History  Procedure Laterality Date  . Pacemaker insertion  1990    last changed 2003  . Cystoscopy  01/2011    neg  . Back surgery    . Parotidectomy      right  . Craniotomy  12/14/2011    Procedure: CRANIOTOMY HEMATOMA EVACUATION SUBDURAL;  Surgeon: Cristi Loron, MD;   Location: [facility];  Service: Neurosurgery;  Laterality: Left;  LEFT Craniotomy for subdural   . Neurofibroma excision  1980  . Cystoscopy with biopsy  04/01/2012    Procedure: CYSTOSCOPY WITH BIOPSY;  Surgeon: Garnett Farm, MD;  Location[facility];  Service: Urology;  Laterality: N/A;  BLADDER BIOPSIES and bladder washings  . Cystoscopy w/ retrogrades  04/01/2012    Procedure: CYSTOSCOPY WITH RETROGRADE PYELOGRAM;  Surgeon: Garnett Farm, MD;  Location: [facility];  Service: Urology;  Laterality: Bilateral;  . Ureteroscopy  04/01/2012    Procedure: URETEROSCOPY;  Surgeon: Garnett Farm, MD;  [facility];  Service: Urology;  Laterality: Right;  . Cystoscopy/retrograde/ureteroscopy  05/02/2012    Procedure: CYSTOSCOPY/RETROGRADE/URETEROSCOPY;  Surgeon: Garnett Farm, MD;  Location: [facility];  Service: Urology;  Laterality: Bilateral;  CYSTOSCOPY BILATERAL RETROGRADE PYLOGRAM BILATERAL URETEROSCOPY AND BIOPSY  rad tech ok per rick   . Cystoscopy with biopsy  05/02/2012    Procedure: CYSTOSCOPY WITH BIOPSY;  Surgeon: Garnett Farm, MD;  Location[facility];  Service: Urology;  Laterality: N/A;  . Cystoscopy with stent placement  05/02/2012  Procedure: CYSTOSCOPY WITH STENT PLACEMENT;  Surgeon: Garnett Farm, MD;  Location: [facility];  Service: Urology;  Laterality: Bilateral;   Family History  Problem Relation Age of Onset  . Heart disease Mother   . Cancer Father   . Emphysema Brother   . Coronary artery disease Son    History  Substance Use Topics  . Smoking status: Never Smoker   . Smokeless tobacco: Never Used  . Alcohol Use: 10.5 oz/week    21 drink(s) per week     Comment: 3 /day( bourbon)    Review of Systems  All other systems reviewed and are negative.    Allergies  Codeine and Docetaxel  Home Medications   Current Outpatient Rx  Name  Route   Sig  Dispense  Refill  . acetaminophen (TYLENOL) 500 MG tablet   Oral   Take 500 mg by mouth every 6 (six) hours as needed.         Marland Kitchen atorvastatin (LIPITOR) 40 MG tablet   Oral   Take 40 mg by mouth daily.          . digoxin (LANOXIN) 0.25 MG tablet   Oral   Take 0.125 mg by mouth daily. Takes 1/2 tab         . metoprolol succinate (TOPROL-XL) 25 MG 24 hr tablet   Oral   Take 25 mg by mouth daily.         . pantoprazole (PROTONIX) 40 MG tablet   Oral   Take 40 mg by mouth daily.         . ramipril (ALTACE) 10 MG capsule   Oral   Take 10 mg by mouth daily.         . chlordiazePOXIDE (LIBRIUM) 25 MG capsule   Oral   Take 1 capsule (25 mg total) by mouth 3 (three) times daily as needed for anxiety (or shakiness, do not mix with alcohol).   15 capsule   0    BP 155/71  Pulse 93  Temp(Src) 97.5 F (36.4 C) (Oral)  Resp 16  SpO2 99% Physical Exam  Nursing note and vitals reviewed. Constitutional: No distress.  HENT:  Head: Normocephalic and atraumatic.  Right Ear: External ear normal.  Left Ear: External ear normal.  Eyes: Conjunctivae are normal. Right eye exhibits no discharge. Left eye exhibits no discharge. No scleral icterus.  Neck: Neck supple. No tracheal deviation present.  Cardiovascular: Normal rate, regular rhythm and intact distal pulses.   Pulmonary/Chest: Effort normal and breath sounds normal. No stridor. No respiratory distress. He has no wheezes. He has no rales.  Abdominal: Soft. Bowel sounds are normal. He exhibits no distension. There is no tenderness. There is no rebound and no guarding.  Musculoskeletal: He exhibits no edema.       Lumbar back: He exhibits tenderness and bony tenderness. He exhibits no swelling, no edema and no deformity.  Neurological: He is alert. He has normal strength. He displays tremor. No sensory deficit. Cranial nerve deficit:  no gross defecits noted. He exhibits normal muscle tone. He displays no seizure  activity. Coordination normal.  Mild resting tremor  Skin: Skin is warm and dry. No rash noted.  Psychiatric: He has a normal mood and affect.    ED Course  Procedures (including critical care time) 1815  Pt was assessed by pysch pa earlier.  Not a candidate for [employer] for alcohol detox with his age.  Family is concerned  about possible DTs.  I have reviewed the records and did not see a mention of it in the discharge summary.  Family states however that he has had DTs in the past.  I will consult with his medical doctor regarding possible observation admission.  Labs Review Labs Reviewed  CBC WITH DIFFERENTIAL - Abnormal; Notable for the following:    RBC 3.90 (*)    MCV 102.3 (*)    MCH 35.9 (*)    All other components within normal limits  COMPREHENSIVE METABOLIC PANEL - Abnormal; Notable for the following:    Sodium 129 (*)    Chloride 91 (*)    Glucose, Bld 128 (*)    Albumin 3.4 (*)    Alkaline Phosphatase 118 (*)    Total Bilirubin 1.5 (*)    GFR calc non Af Amer 54 (*)    GFR calc Af Amer 63 (*)    All other components within normal limits  LIPASE, BLOOD - Abnormal; Notable for the following:    Lipase 106 (*)    All other components within normal limits  ETHANOL  URINE RAPID DRUG SCREEN (HOSP PERFORMED)   Imaging Review Dg Lumbar Spine Complete  02/26/2013   *RADIOLOGY REPORT*  Clinical Data: Status post fall.  Pain  LUMBAR SPINE - COMPLETE 4+ VIEW  Comparison: None  Findings: Normal alignment of the lumbar spine.  The vertebral body heights are well preserved.  There is multilevel disc space narrowing and ventral endplate spurring identified. The transverse processes  are obscured by overlying bowel gas.  IMPRESSION:  1.  Lumbar spondylosis. 2.  No acute findings identified.   Original Report Authenticated By: Signa Kell, M.D.    MDM   1. Alcohol abuse    The patient was monitored in the emergency department for several hours. He is not  tremulous or tachycardic. Patient actually feels well and like to go home.  I discussed the case with Dr. Felipa Eth on call for Dr. Wylene Simmer.  He will insure close followup.  I'll discharge the patient home on a short course of Librium to help him with any withdrawal symptoms he may experience   Celene Kras, MD 02/26/13 2103

## 2013-03-07 ENCOUNTER — Other Ambulatory Visit: Payer: Self-pay

## 2013-03-07 ENCOUNTER — Telehealth: Payer: Self-pay

## 2013-03-07 ENCOUNTER — Other Ambulatory Visit: Payer: Self-pay | Admitting: Internal Medicine

## 2013-03-07 DIAGNOSIS — C07 Malignant neoplasm of parotid gland: Secondary | ICD-10-CM

## 2013-03-07 NOTE — Telephone Encounter (Signed)
I called pt and spoke with daughter. Pt stated "we might as well take it out" when asked about port. It has not been used/flushed in about 1 yr. Information forwarded to Dr Rosie Fate.

## 2013-03-07 NOTE — Telephone Encounter (Signed)
S/w daughter that Dr Rosie Fate wants to see pt before he orders port removal. She expressed understanding

## 2013-03-09 ENCOUNTER — Telehealth: Payer: Self-pay | Admitting: Internal Medicine

## 2013-03-09 NOTE — Telephone Encounter (Signed)
, °

## 2013-03-28 ENCOUNTER — Other Ambulatory Visit (HOSPITAL_BASED_OUTPATIENT_CLINIC_OR_DEPARTMENT_OTHER): Payer: Medicare Other | Admitting: Lab

## 2013-03-28 ENCOUNTER — Telehealth: Payer: Self-pay | Admitting: *Deleted

## 2013-03-28 ENCOUNTER — Ambulatory Visit (HOSPITAL_BASED_OUTPATIENT_CLINIC_OR_DEPARTMENT_OTHER): Payer: Medicare Other | Admitting: Internal Medicine

## 2013-03-28 VITALS — BP 123/73 | HR 69 | Temp 97.1°F | Resp 20 | Ht 69.0 in | Wt 180.6 lb

## 2013-03-28 DIAGNOSIS — F10231 Alcohol dependence with withdrawal delirium: Secondary | ICD-10-CM

## 2013-03-28 DIAGNOSIS — C679 Malignant neoplasm of bladder, unspecified: Secondary | ICD-10-CM

## 2013-03-28 DIAGNOSIS — D0910 Carcinoma in situ of unspecified urinary organ: Secondary | ICD-10-CM

## 2013-03-28 DIAGNOSIS — D473 Essential (hemorrhagic) thrombocythemia: Secondary | ICD-10-CM

## 2013-03-28 DIAGNOSIS — C07 Malignant neoplasm of parotid gland: Secondary | ICD-10-CM

## 2013-03-28 DIAGNOSIS — F1020 Alcohol dependence, uncomplicated: Secondary | ICD-10-CM

## 2013-03-28 LAB — CBC WITH DIFFERENTIAL/PLATELET
BASO%: 0.2 % (ref 0.0–2.0)
Eosinophils Absolute: 0.1 10*3/uL (ref 0.0–0.5)
LYMPH%: 19.7 % (ref 14.0–49.0)
MCHC: 33.3 g/dL (ref 32.0–36.0)
MCV: 104.2 fL — ABNORMAL HIGH (ref 79.3–98.0)
MONO%: 5.8 % (ref 0.0–14.0)
NEUT%: 72.8 % (ref 39.0–75.0)
Platelets: 416 10*3/uL — ABNORMAL HIGH (ref 140–400)
RBC: 3.74 10*6/uL — ABNORMAL LOW (ref 4.20–5.82)

## 2013-03-28 LAB — COMPREHENSIVE METABOLIC PANEL (CC13)
Alkaline Phosphatase: 171 U/L — ABNORMAL HIGH (ref 40–150)
Creatinine: 1.3 mg/dL (ref 0.7–1.3)
Glucose: 124 mg/dl (ref 70–140)
Sodium: 138 mEq/L (ref 136–145)
Total Bilirubin: 0.7 mg/dL (ref 0.20–1.20)
Total Protein: 7.5 g/dL (ref 6.4–8.3)

## 2013-03-28 NOTE — Progress Notes (Signed)
[facility] OFFICE PROGRESS NOTE  [name], MD [address] [facility], [place]. [place] [address]  DIAGNOSIS: Primary mucoepidermoid carcinoma of parotid gland - Plan: IR Removal Tun Access W/ Port W/O FL, Comprehensive metabolic panel, CBC with Differential  Alcohol withdrawal delirium - Plan: IR Removal Tun Access W/ Port W/O FL, Comprehensive metabolic panel, CBC with Differential  Bladder cancer - Plan: IR Removal Tun Access W/ Port W/O FL, Comprehensive metabolic panel, CBC with Differential  Thrombocytosis  Chief Complaint  Patient presents with  . Cancer of the parotid gland    CURRENT THERAPY:  INTERVAL HISTORY: [name] 79 y.o. 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  Patient also has a history of bladder cancer (November 2009) treated with intravesical BCG by Dr. Ihor Gully in the spring 2010.  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 He was last seen by Dr. Arline Asp on 05/19/2011. Today he is accompanied by his daughter there he continues to have his Port-A-Cath flush every 2 months. Today he is without any complaints. He denies any pain, shortness of breath, blood in his urine. Of note, he's had a fall [place] and a long hospitalization and subsequent rehabilitation there is daughter reports that this was related to alcoholism. The patient reports that he quit drinking about 1 month ago. He denies any recent falls he reports compliance with Plavix. Given his recent urothelial carcinoma in situ history, he's had evaluation by a read urologists about 2 weeks ago.  MEDICAL HISTORY: Past Medical History  Diagnosis Date  . Atrial fibrillation 1990  . Dyslipidemia   . Prostate hypertrophy   . Arthritis     degenerative-knees  . History of radiation therapy  05/05/07-06/15/07    right parotid through subclavicular region/ mid jugularlymph node chain  . Allergy   . Hypertension     labile  . Heart murmur     2/6 systolic  . History of chemotherapy      Hx carboplatin/1fu  . Xerostomia     limited  . Stroke 2003    TIA  . Bladder cancer 2010  . Cancer 05/05/07-06/15/07    parotid gland/66.4 GY  . HOH (hard of hearing)   . Sleep apnea     Not diagnosied; pt denies 03/29/12  . Pacemaker     2003 vent paced , rate 69  . Chronic kidney disease     positive cytology  . Dry eyes   . Diabetes mellitus without complication     meds d/c  1 year ago  . GERD (gastroesophageal reflux disease)   . History of radiation therapy 05/05/07-06/15/07    Right parotid/hemineck,supraclavicular    INTERIM HISTORY: has Primary mucoepidermoid carcinoma of parotid gland; Bladder cancer; Elevated PSA; Pacemaker; Diabetes mellitus; Hypertension; Dyslipidemia; Cancer; Subdural hematoma; ETOH abuse; Campath-induced atrial fibrillation; UTI (lower urinary tract infection); TIA (transient ischemic attack); Acute pulmonary edema; Alcohol withdrawal delirium; Abnormal urine cytology; and Thrombocytosis on his problem list.    ALLERGIES:  is allergic to codeine and docetaxel.  MEDICATIONS: has a current medication list which includes the following prescription(s): atorvastatin, digoxin, ibuprofen, metoprolol succinate, naproxen sodium, pantoprazole, and ramipril.  SURGICAL HISTORY:  Past Surgical History  Procedure Laterality Date  . Pacemaker insertion  1990    last changed 2003  . Cystoscopy  01/2011  neg  . Back surgery    . Parotidectomy      right  . Craniotomy  12/14/2011    Procedure: CRANIOTOMY HEMATOMA EVACUATION SUBDURAL;  Surgeon: Cristi Loron, MD;  Location: [facility];  Service: Neurosurgery;  Laterality: Left;  LEFT Craniotomy for subdural   . Neurofibroma excision  1980  . Cystoscopy with biopsy  04/01/2012    Procedure: CYSTOSCOPY  WITH BIOPSY;  Surgeon: Garnett Farm, MD;  Location: [facility];  Service: Urology;  Laterality: N/A;  BLADDER BIOPSIES and bladder washings  . Cystoscopy w/ retrogrades  04/01/2012    Procedure: CYSTOSCOPY WITH RETROGRADE PYELOGRAM;  Surgeon: Garnett Farm, MD;  Location: [facility];  Service: Urology;  Laterality: Bilateral;  . Ureteroscopy  04/01/2012    Procedure: URETEROSCOPY;  Surgeon: Garnett Farm, MD;  Location: [facility];  Service: Urology;  Laterality: Right;  . Cystoscopy/retrograde/ureteroscopy  05/02/2012    Procedure: CYSTOSCOPY/RETROGRADE/URETEROSCOPY;  Surgeon: Garnett Farm, MD;  Location: [facility];  Service: Urology;  Laterality: Bilateral;  CYSTOSCOPY BILATERAL RETROGRADE PYLOGRAM BILATERAL URETEROSCOPY AND BIOPSY  rad tech ok per rick   . Cystoscopy with biopsy  05/02/2012    Procedure: CYSTOSCOPY WITH BIOPSY;  Surgeon: Garnett Farm, MD;  Location: [facility];  Service: Urology;  Laterality: N/A;  . Cystoscopy with stent placement  05/02/2012    Procedure: CYSTOSCOPY WITH STENT PLACEMENT;  Surgeon: Garnett Farm, MD;  Location: [facility];  Service: Urology;  Laterality: Bilateral;   PROBLEM LIST:  1. Primary mucoepidermoid carcinoma of the parotid gland, high-grade T2 Nx status post right parotidectomy with nerve integrity monitoring on 01/12/2007 by Dr. Osborn Coho. Margin was positive. There was perineural invasion. The patient received initial chemotherapy. He initially received carboplatin, Taxotere and Neulasta on 02/18/2007. He developed a rash which we felt was due to Taxotere. Thereafter he was treated with carboplatin, 5-FU  and Neulasta 2 cycles in mid September and in mid October. Thereafter he received weekly carboplatin 6 treatments in conjunction with radiation treatments to the right parotid gland area from 04/28/2007 through 06/08/2007. The patient has  remained in complete remission since that time. As stated, we have maintained his Port-A-Cath.  2. Invasive high-grade urothelial bladder cancer diagnosed November 2009 treated with intravesical BCG in the spring of 2010, subsequently diagnosed with carcinoma in situ on 12/12/2010 by Dr. Ihor Gully treated with 6 weekly treatments of intravesical BCG, with most recent cystoscopy on 02/27/2011.  3. Elevated PSA.  4. Transvenous pacemaker in the left chest.  5. Diabetes mellitus.  6. Hypertension.  7. Dyslipidemia.  8. History of atrial fibrillation.  9. BPH.  10.Degenerative arthritis of the knees.  11.History of TIAs.  12.Palpable left axillary masses.   REVIEW OF SYSTEMS:   Constitutional: Denies fevers, chills or abnormal weight loss Eyes: Denies blurriness of vision Ears, nose, mouth, throat, and face: Denies mucositis or sore throat Respiratory: Denies cough, dyspnea or wheezes Cardiovascular: Denies palpitation, chest discomfort or lower extremity swelling Gastrointestinal:  Denies nausea, heartburn or change in bowel habits Skin: Denies abnormal skin rashes Lymphatics: Denies new lymphadenopathy or easy bruising Neurological:Denies numbness, tingling or new weaknesses Behavioral/Psych: Mood is stable, no new changes  All other systems were reviewed with the patient and are negative.  PHYSICAL EXAMINATION: ECOG PERFORMANCE STATUS: 0 - Asymptomatic  Blood pressure 123/73, pulse 69, temperature 97.1 F (36.2 C), temperature source Oral, resp. rate 20, height  5\' 9"  (1.753 m), weight 180 lb 9.6 oz (81.92 kg).  GENERAL:alert, no distress and comfortable; in elderly gentleman who appears his stated age. SKIN: skin color, texture, turgor are normal, no rashes or significant lesions; right-sided Port-A-Cath. EYES: normal, Conjunctiva are pink and non-injected, sclera clear OROPHARYNX:no exudate, no erythema and lips, buccal mucosa, and tongue normal  NECK: supple, thyroid normal  size, non-tender, without nodularity LYMPH:  no palpable lymphadenopathy in the cervical, axillary or supraclavicular LUNGS: clear to auscultation and percussion with normal breathing effort HEART: regular rate & rhythm and positive systolic ejection murmur and 1 plus lower extremity edema; pacemaker on the left. ABDOMEN:abdomen soft, non-tender and normal bowel sounds Musculoskeletal:no cyanosis of digits and no clubbing  NEURO: alert & oriented x 3 with fluent speech, no focal motor/sensory deficits   LABORATORY DATA: Results for orders placed in visit on 03/28/13 (from the past 48 hour(s))  CBC WITH DIFFERENTIAL     Status: Abnormal   Collection Time    03/28/13  1:02 PM      Result Value Range   WBC 7.5  4.0 - 10.3 10e3/uL   NEUT# 5.5  1.5 - 6.5 10e3/uL   HGB 13.0  13.0 - 17.1 g/dL   HCT 16.1  09.6 - 04.5 %   Platelets 416 (*) 140 - 400 10e3/uL   MCV 104.2 (*) 79.3 - 98.0 fL   MCH 34.7 (*) 27.2 - 33.4 pg   MCHC 33.3  32.0 - 36.0 g/dL   RBC 4.09 (*) 8.11 - 9.14 10e6/uL   RDW 14.1  11.0 - 14.6 %   lymph# 1.5  0.9 - 3.3 10e3/uL   MONO# 0.4  0.1 - 0.9 10e3/uL   Eosinophils Absolute 0.1  0.0 - 0.5 10e3/uL   Basophils Absolute 0.0  0.0 - 0.1 10e3/uL   NEUT% 72.8  39.0 - 75.0 %   LYMPH% 19.7  14.0 - 49.0 %   MONO% 5.8  0.0 - 14.0 %   EOS% 1.5  0.0 - 7.0 %   BASO% 0.2  0.0 - 2.0 %  COMPREHENSIVE METABOLIC PANEL (CC13)     Status: Abnormal   Collection Time    03/28/13  1:02 PM      Result Value Range   Sodium 138  136 - 145 mEq/L   Potassium 4.7  3.5 - 5.1 mEq/L   Chloride 101  98 - 109 mEq/L   CO2 24  22 - 29 mEq/L   Glucose 124  70 - 140 mg/dl   BUN 78.2  7.0 - 95.6 mg/dL   Creatinine 1.3  0.7 - 1.3 mg/dL   Total Bilirubin 2.13  0.20 - 1.20 mg/dL   Alkaline Phosphatase 171 (*) 40 - 150 U/L   AST 15  5 - 34 U/L   ALT 12  0 - 55 U/L   Total Protein 7.5  6.4 - 8.3 g/dL   Albumin 2.8 (*) 3.5 - 5.0 g/dL   Calcium 9.9  8.4 - 08.6 mg/dL   Anion Gap 12 (*) 3 - 11 mEq/L      Labs:  Lab Results  Component Value Date   WBC 7.5 03/28/2013   HGB 13.0 03/28/2013   HCT 39.0 03/28/2013   MCV 104.2* 03/28/2013   PLT 416* 03/28/2013   NEUTROABS 5.5 03/28/2013      Chemistry      Component Value Date/Time   NA 138 03/28/2013 1302   NA 129* 02/26/2013 1650   K 4.7 03/28/2013  1302   K 3.8 02/26/2013 1650   CL 91* 02/26/2013 1650   CO2 24 03/28/2013 1302   CO2 25 02/26/2013 1650   BUN 18.0 03/28/2013 1302   BUN 17 02/26/2013 1650   CREATININE 1.3 03/28/2013 1302   CREATININE 1.21 02/26/2013 1650      Component Value Date/Time   CALCIUM 9.9 03/28/2013 1302   CALCIUM 9.5 02/26/2013 1650   ALKPHOS 171* 03/28/2013 1302   ALKPHOS 118* 02/26/2013 1650   AST 15 03/28/2013 1302   AST 26 02/26/2013 1650   ALT 12 03/28/2013 1302   ALT 12 02/26/2013 1650   BILITOT 0.70 03/28/2013 1302   BILITOT 1.5* 02/26/2013 1650      RADIOGRAPHIC STUDIES: Dg Lumbar Spine Complete  02/26/2013   *RADIOLOGY REPORT*  Clinical Data: Status post fall.  Pain  LUMBAR SPINE - COMPLETE 4+ VIEW  Comparison: None  Findings: Normal alignment of the lumbar spine.  The vertebral body heights are well preserved.  There is multilevel disc space narrowing and ventral endplate spurring identified. The transverse processes  are obscured by overlying bowel gas.  IMPRESSION:  1.  Lumbar spondylosis. 2.  No acute findings identified.   Original Report Authenticated By: Signa Kell, M.D.    ASSESSMENT: [name] 79 y.o. male with a history of Primary mucoepidermoid carcinoma of parotid gland - Plan: IR Removal Tun Access W/ Port W/O FL, Comprehensive metabolic panel, CBC with Differential  Alcohol withdrawal delirium - Plan: IR Removal Tun Access W/ Port W/O FL, Comprehensive metabolic panel, CBC with Differential  Bladder cancer - Plan: IR Removal Tun Access W/ Port W/O FL, Comprehensive metabolic panel, CBC with Differential  Thrombocytosis   PLAN:  1.  Primary mucoepidermoid carcinoma of partoid gland. --Mr.  Tessler continues to be apparently disease free from his right body at tumor high-grade mucoepidermoid carcinoma now over 6 years.  Given the duration of his absence of disease, we will have his port a cath removed. In addition we hope to minimize his risks of infection due to his infrequent flushes the port-a-catheter. The patient was agreeable with this plan. He is being referred to interventional radiology for consideration of port a catheter removal. His cardiology team was notified and will assist in managing his Plavix in the setting of porta catheter removal  2. Urothelial carcinoma in situ.  -- He remains under treatment careful surveillance regarding his-year-old the carcinoma in situ. He is seeing Dr.Ottelin every 3 months and has repeat cystoscopies.  3. Alcoholism with history of falls. --Patient did not want referral to social services for assistance with alcohol abuse. He will continue to abstain from alcohol in effort to prevent further falls. We advised him to give our office is a call should he need further assistance in this effort. We counseled him extensively on fall preventive measurements.  4. Thrombocytosis.  --His platelets are at 416,000 today. This is likely reactive and we will continue to monitor closely.  5. Follow-up.  He will followup with [place] in 4 months time at that time we'll check CBC and chemistries.  All questions were answered. The patient knows to call the clinic with any problems, questions or concerns. We can certainly see the patient much sooner if necessary.  I spent 15 minutes counseling the patient face to face. The total time spent in the appointment was 25 minutes.    Liviah Cake, MD 03/29/2013 8:24 AM

## 2013-03-28 NOTE — Telephone Encounter (Signed)
Appts made and printed.  SW Tina in IR she will call pt w/appt to have port removed...pt is aware shh

## 2013-03-29 ENCOUNTER — Telehealth: Payer: Self-pay | Admitting: Medical Oncology

## 2013-03-29 ENCOUNTER — Telehealth: Payer: Self-pay | Admitting: Cardiovascular Disease

## 2013-03-29 NOTE — Telephone Encounter (Signed)
Would like to stop Plavix for 5 days-needs to take his port out. Please fax a note to [phone].

## 2013-03-29 NOTE — Telephone Encounter (Signed)
I received a call from [place] in IR stating that pt is taking plavix. He will need to stop 5 days prior to port removal. She will need an ok. I called daughter and she states that Dr. Alanda Amass is the prescribing MD. I called Dr. Kandis Cocking office and he has retired. They will check with the cardiologist that will be taking over the pts' care. I asked if they could fax [place] a note and I will forward to [place] in IR.

## 2013-03-30 ENCOUNTER — Encounter: Payer: Self-pay | Admitting: Internal Medicine

## 2013-03-30 DIAGNOSIS — D473 Essential (hemorrhagic) thrombocythemia: Secondary | ICD-10-CM | POA: Insufficient documentation

## 2013-03-30 NOTE — Telephone Encounter (Signed)
Forward  to Dr.Hilty for review 

## 2013-03-31 ENCOUNTER — Other Ambulatory Visit: Payer: Self-pay | Admitting: Radiology

## 2013-03-31 NOTE — Telephone Encounter (Signed)
It's okay. I sent a note to the interventional radiologist regarding this.  Thanks.  Dr. Rennis Golden

## 2013-04-03 ENCOUNTER — Telehealth (HOSPITAL_COMMUNITY): Payer: Self-pay | Admitting: Radiology

## 2013-04-03 ENCOUNTER — Encounter (HOSPITAL_COMMUNITY): Payer: Self-pay | Admitting: Pharmacy Technician

## 2013-04-03 ENCOUNTER — Other Ambulatory Visit: Payer: Self-pay | Admitting: Radiology

## 2013-04-03 NOTE — Telephone Encounter (Signed)
Called patient to go over instructions for portacath removal.  NPO after 0800, have driver, last dose of plavix of 03-31-13, check in at Radiology at 12:30.  Patient given [phone] to call with any further questions.

## 2013-04-06 ENCOUNTER — Encounter (HOSPITAL_COMMUNITY): Payer: Self-pay

## 2013-04-06 ENCOUNTER — Ambulatory Visit (HOSPITAL_COMMUNITY)
Admission: RE | Admit: 2013-04-06 | Discharge: 2013-04-06 | Disposition: A | Discharge status: Home / Self Care | Payer: Medicare Other | Source: Ambulatory Visit | Attending: Internal Medicine | Admitting: Internal Medicine

## 2013-04-06 ENCOUNTER — Ambulatory Visit (HOSPITAL_COMMUNITY): Admission: RE | Admit: 2013-04-06 | Payer: Medicare Other | Source: Ambulatory Visit

## 2013-04-06 DIAGNOSIS — I129 Hypertensive chronic kidney disease with stage 1 through stage 4 chronic kidney disease, or unspecified chronic kidney disease: Secondary | ICD-10-CM | POA: Insufficient documentation

## 2013-04-06 DIAGNOSIS — C679 Malignant neoplasm of bladder, unspecified: Secondary | ICD-10-CM

## 2013-04-06 DIAGNOSIS — E119 Type 2 diabetes mellitus without complications: Secondary | ICD-10-CM | POA: Insufficient documentation

## 2013-04-06 DIAGNOSIS — C07 Malignant neoplasm of parotid gland: Secondary | ICD-10-CM

## 2013-04-06 DIAGNOSIS — K219 Gastro-esophageal reflux disease without esophagitis: Secondary | ICD-10-CM | POA: Insufficient documentation

## 2013-04-06 DIAGNOSIS — N189 Chronic kidney disease, unspecified: Secondary | ICD-10-CM | POA: Insufficient documentation

## 2013-04-06 DIAGNOSIS — I4891 Unspecified atrial fibrillation: Secondary | ICD-10-CM | POA: Insufficient documentation

## 2013-04-06 DIAGNOSIS — Z8546 Personal history of malignant neoplasm of prostate: Secondary | ICD-10-CM | POA: Insufficient documentation

## 2013-04-06 DIAGNOSIS — F10231 Alcohol dependence with withdrawal delirium: Secondary | ICD-10-CM

## 2013-04-06 DIAGNOSIS — Z85528 Personal history of other malignant neoplasm of kidney: Secondary | ICD-10-CM | POA: Insufficient documentation

## 2013-04-06 DIAGNOSIS — Z452 Encounter for adjustment and management of vascular access device: Secondary | ICD-10-CM | POA: Insufficient documentation

## 2013-04-06 DIAGNOSIS — Z8673 Personal history of transient ischemic attack (TIA), and cerebral infarction without residual deficits: Secondary | ICD-10-CM | POA: Insufficient documentation

## 2013-04-06 DIAGNOSIS — Z85819 Personal history of malignant neoplasm of unspecified site of lip, oral cavity, and pharynx: Secondary | ICD-10-CM | POA: Insufficient documentation

## 2013-04-06 DIAGNOSIS — Z950 Presence of cardiac pacemaker: Secondary | ICD-10-CM | POA: Insufficient documentation

## 2013-04-06 MED ORDER — CEFAZOLIN SODIUM-DEXTROSE 2-3 GM-% IV SOLR: 2.0000 g | INTRAVENOUS | Status: DC

## 2013-04-06 MED ORDER — CEFAZOLIN SODIUM-DEXTROSE 2-3 GM-% IV SOLR
2.0000 g | Freq: Once | INTRAVENOUS | Status: AC
  Administered 2013-04-06: 2 g via INTRAVENOUS

## 2013-04-06 MED ORDER — CEFAZOLIN SODIUM-DEXTROSE 2-3 GM-% IV SOLR
INTRAVENOUS | Status: AC
  Filled 2013-04-06: qty 50

## 2013-04-06 NOTE — H&P (Signed)
[name] is an 79 y.o. male.   Chief Complaint: pt with hx parotid ca diagnosed in 2008 Port a cath placed for chemo then Used that yr  and flushed every 90 days til 1 yr ago---has not used or flushed since then Presents today for removal per oncologist  HPI: a fib- off Plavix x 7 days; parotic ca; prostate ca; bladder ca; HTN; CVA/SDH; HOH; pacemaker; CKD; DM; GERD  Past Medical History  Diagnosis Date  . Atrial fibrillation 1990  . Dyslipidemia   . Prostate hypertrophy   . Arthritis     degenerative-knees  . History of radiation therapy 05/05/07-06/15/07    right parotid through subclavicular region/ mid jugularlymph node chain  . Allergy   . Hypertension     labile  . Heart murmur     2/6 systolic  . History of chemotherapy      Hx carboplatin/73fu  . Xerostomia     limited  . Stroke 2003    TIA  . Bladder cancer 2010  . Cancer 05/05/07-06/15/07    parotid gland/66.4 GY  . HOH (hard of hearing)   . Sleep apnea     Not diagnosied; pt denies 03/29/12  . Pacemaker     2003 vent paced , rate 69  . Chronic kidney disease     positive cytology  . Dry eyes   . Diabetes mellitus without complication     meds d/c  1 year ago  . GERD (gastroesophageal reflux disease)   . History of radiation therapy 05/05/07-06/15/07    Right parotid/hemineck,supraclavicular    Past Surgical History  Procedure Laterality Date  . Pacemaker insertion  1990    last changed 2003  . Cystoscopy  01/2011    neg  . Back surgery    . Parotidectomy      right  . Craniotomy  12/14/2011    Procedure: CRANIOTOMY HEMATOMA EVACUATION SUBDURAL;  Surgeon: Cristi Loron, MD;  Location: [facility];  Service: Neurosurgery;  Laterality: Left;  LEFT Craniotomy for subdural   . Neurofibroma excision  1980  . Cystoscopy with biopsy  04/01/2012    Procedure: CYSTOSCOPY WITH BIOPSY;  Surgeon: Garnett Farm, MD;  Location: [facility];  Service: Urology;  Laterality: N/A;  BLADDER  BIOPSIES and bladder washings  . Cystoscopy w/ retrogrades  04/01/2012    Procedure: CYSTOSCOPY WITH RETROGRADE PYELOGRAM;  Surgeon: Garnett Farm, MD;  Location: [facility];  Service: Urology;  Laterality: Bilateral;  . Ureteroscopy  04/01/2012    Procedure: URETEROSCOPY;  Surgeon: Garnett Farm, MD;  Location: [facility];  Service: Urology;  Laterality: Right;  . Cystoscopy/retrograde/ureteroscopy  05/02/2012    Procedure: CYSTOSCOPY/RETROGRADE/URETEROSCOPY;  Surgeon: Garnett Farm, MD;  Location: [facility];  Service: Urology;  Laterality: Bilateral;  CYSTOSCOPY BILATERAL RETROGRADE PYLOGRAM BILATERAL URETEROSCOPY AND BIOPSY  rad tech ok per rick   . Cystoscopy with biopsy  05/02/2012    Procedure: CYSTOSCOPY WITH BIOPSY;  Surgeon: Garnett Farm, MD;  Location[facility];  Service: Urology;  Laterality: N/A;  . Cystoscopy with stent placement  05/02/2012    Procedure: CYSTOSCOPY WITH STENT PLACEMENT;  Surgeon: Garnett Farm, MD;  Location: [facility];  Service: Urology;  Laterality: Bilateral;    Family History  Problem Relation Age of Onset  . Heart disease Mother   . Cancer Father   . Emphysema Brother   . Coronary artery  disease Son    Social History:  reports that he has never smoked. He has never used smokeless tobacco. He reports that he drinks about 10.5 ounces of alcohol per week. He reports that he does not use illicit drugs.  Allergies:  Allergies  Allergen Reactions  . Codeine     Blisters between fingers  . Docetaxel Rash     (Not in a hospital admission)  No results found for this or any previous visit (from the past 48 hour(s)). No results found.  Review of Systems  Constitutional: Negative for fever.  HENT: Positive for hearing loss.   Respiratory: Negative for cough and shortness of breath.   Cardiovascular: Negative for chest pain.  Gastrointestinal: Negative for nausea,  vomiting and abdominal pain.  Neurological: Negative for weakness and headaches.    Blood pressure 134/64, pulse 68, temperature 97.8 F (36.6 C), temperature source Oral, resp. rate 18, SpO2 100.00%. Physical Exam  Constitutional: He is oriented to person, place, and time. He appears well-nourished.  Cardiovascular: Normal rate and regular rhythm.   Murmur heard. Respiratory: Effort normal and breath sounds normal. He has no wheezes.  GI: Soft. Bowel sounds are normal. There is no tenderness.  Musculoskeletal: Normal range of motion.  Uses cane sometimes  Neurological: He is alert and oriented to person, place, and time.  Skin: Skin is warm.  Psychiatric: He has a normal mood and affect. His behavior is normal. Judgment and thought content normal.     Assessment/Plan PAC for parotid ca treatment 2008 Has not used for many yrs Has not been flushed for 1 yr Removal now per Oncologist Pt aware of procedure benefits and risks and agreeable to proceed Consent signed and in chart  Dove G[place]am A 04/06/2013, 12:05 PM

## 2013-04-11 ENCOUNTER — Ambulatory Visit (INDEPENDENT_AMBULATORY_CARE_PROVIDER_SITE_OTHER): Payer: Medicare Other | Admitting: Cardiovascular Disease

## 2013-04-11 ENCOUNTER — Encounter: Payer: Self-pay | Admitting: Cardiovascular Disease

## 2013-04-11 VITALS — BP 100/60 | HR 69 | Resp 16 | Ht 69.5 in | Wt 181.3 lb

## 2013-04-11 DIAGNOSIS — Z950 Presence of cardiac pacemaker: Secondary | ICD-10-CM

## 2013-04-11 DIAGNOSIS — I4891 Unspecified atrial fibrillation: Secondary | ICD-10-CM

## 2013-04-11 DIAGNOSIS — I359 Nonrheumatic aortic valve disorder, unspecified: Secondary | ICD-10-CM

## 2013-04-11 DIAGNOSIS — I350 Nonrheumatic aortic (valve) stenosis: Secondary | ICD-10-CM

## 2013-04-11 LAB — PACEMAKER DEVICE OBSERVATION

## 2013-04-11 NOTE — Patient Instructions (Signed)
Your physician recommends that you schedule a follow-up appointment in: 2 months for a pacemaker battery check at the [facility] location, and in 6 months with MD

## 2013-04-18 ENCOUNTER — Encounter: Payer: Self-pay | Admitting: Cardiovascular Disease

## 2013-04-18 LAB — PACEMAKER DEVICE OBSERVATION
BMOD-0002RV: 8
BRDY-0002RV: 69 {beats}/min
BRDY-0004RV: 115 {beats}/min
DEVICE MODEL PM: 114623
RV LEAD AMPLITUDE: 12 mv
VENTRICULAR PACING PM: 83

## 2013-04-24 ENCOUNTER — Telehealth: Payer: Self-pay | Admitting: Internal Medicine

## 2013-04-24 NOTE — Telephone Encounter (Signed)
Pt has pacer ck with device 06-12-13 and recall with Dr. Salena Saner 09/2013/mt

## 2013-04-29 ENCOUNTER — Encounter: Payer: Self-pay | Admitting: Cardiovascular Disease

## 2013-04-29 DIAGNOSIS — I350 Nonrheumatic aortic (valve) stenosis: Secondary | ICD-10-CM | POA: Insufficient documentation

## 2013-04-29 NOTE — Assessment & Plan Note (Signed)
Doppler parameters suggest that this is mild to moderate, but 2D imaging shows heavy calcification which may signify a tendency to rapid progression. Monitor for symptoms of progressive aortic stenosis (exertional angina, exertional dyspnea, exertional syncope).

## 2013-04-29 NOTE — Assessment & Plan Note (Signed)
His generator is approaching replacement indicator and we will start checking his device every 2 months. He is not pacemaker dependent. His ventricular lead is almost 77 years old and still working well, but he would probably benefit from implantation of a new lead that will allow auto capture. We will do this only if we can confirm patency of his left subclavian vein. Notes that his right subclavian vein has also been instrumented with a Port-A-Cath

## 2013-04-29 NOTE — Progress Notes (Signed)
Patient ID: [name], male   DOB: [birth year], 79 y.o.   MRN: [id number]      Reason for office visit Pacemaker followup, atrial fibrillation   [name] is an elderly former patient of Dr. Susa Griffins who has a history of atrial fibrillation and a single-chamber permanent pacemaker secondary to slow ventricular response. He is here to establish new cardiology followup. He has no known problems with coronary disease and in 2001 had a normal myocardial perfusion study. He has preserved left ventricular systolic function but has mild to moderate calcific aortic valve stenosis (by echo April 2014 estimated valve area around 1.3 mean gradient 16 mm Hg) and moderate mitral insufficiency.  He has a history of subdural hematoma and is therefore not taking warfarin. He is on clopidogrel for stroke prevention. He has treated systemic hypertension and hyperlipidemia. 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  His pacemaker was initially implanted in 1990 with a generator change in 1996, at another generator change in 2003. His initial devices were dual-chamber, subsequently downgraded single-chamber devices for permanent atrial fibrillation. His current device is a [employer] model [id number] [id number]. Full pacemaker check in the office today shows normal device function. He is not pacemaker dependent and has a slow underlying ventricular rhythm at around 50 beats per minute. Ventricular pacing occurs 83% of the time. It is anticipated that the device will reach elective replacement interval and the next 6-12 months  He complains of occasional ankle swelling generally worse in the right ankle than the left and occasional sharp  musculoskeletal type pain in his left chest. He denies shortness of breath exertional chest pain dizziness or syncope or palpitations.   Allergies  Allergen Reactions  . Codeine     Blisters between fingers  . Docetaxel Rash    Current Outpatient Prescriptions  Medication Sig Dispense Refill  . atorvastatin (LIPITOR) 40 MG tablet Take 40 mg by mouth every morning.       . clopidogrel (PLAVIX) 75 MG tablet Take 75 mg by mouth every morning.      . digoxin (LANOXIN) 0.25 MG tablet Take 0.125 mg by mouth every morning. Takes 1/2 tab      . ibuprofen (ADVIL,MOTRIN) 200 MG tablet Take 400 mg by mouth every 8 (eight) hours as needed for pain.       . Naproxen Sodium (ALEVE) 220 MG CAPS Take 220 mg by mouth at bedtime.       . pantoprazole (PROTONIX) 40 MG tablet Take 40 mg by mouth daily as needed (heart burn).       . ramipril (ALTACE) 10 MG capsule Take 10 mg by mouth every morning.       . metoprolol succinate (TOPROL-XL) 25 MG 24 hr tablet Take 25 mg by mouth every morning. Alternate 1 tablet with 1/2 tablet daily       No current facility-administered medications for this visit.    Past Medical History  Diagnosis Date  . Atrial fibrillation 1990  . Dyslipidemia   . Prostate hypertrophy   . Arthritis     degenerative-knees  . History of radiation therapy 05/05/07-06/15/07    right parotid through subclavicular region/ mid jugularlymph node chain  . Allergy   . Hypertension  labile  . Heart murmur     2/6 systolic  . History of chemotherapy      Hx carboplatin/65fu  . Xerostomia     limited  . Stroke 2003    TIA  . Bladder cancer 2010  . Cancer 05/05/07-06/15/07    parotid gland/66.4 GY  . HOH (hard of hearing)   . Sleep apnea     Not diagnosied; pt denies 03/29/12  . Pacemaker     2003 vent paced , rate 69  . Chronic kidney disease     positive cytology  . Dry eyes   . Diabetes mellitus without complication     meds d/c  1 year ago  . GERD (gastroesophageal  reflux disease)   . History of radiation therapy 05/05/07-06/15/07    Right parotid/hemineck,supraclavicular    Past Surgical History  Procedure Laterality Date  . Pacemaker insertion  1990    last changed 2003  . Cystoscopy  01/2011    neg  . Back surgery    . Parotidectomy      right  . Craniotomy  12/14/2011    Procedure: CRANIOTOMY HEMATOMA EVACUATION SUBDURAL;  Surgeon: Cristi Loron, MD;  Location: [facility];  Service: Neurosurgery;  Laterality: Left;  LEFT Craniotomy for subdural   . Neurofibroma excision  1980  . Cystoscopy with biopsy  04/01/2012    Procedure: CYSTOSCOPY WITH BIOPSY;  Surgeon: Garnett Farm, MD;  [facility];  Service: Urology;  Laterality: N/A;  BLADDER BIOPSIES and bladder washings  . Cystoscopy w/ retrogrades  04/01/2012    Procedure: CYSTOSCOPY WITH RETROGRADE PYELOGRAM;  Surgeon: Garnett Farm, MD;  Location[facility];  Service: Urology;  Laterality: Bilateral;  . Ureteroscopy  04/01/2012    Procedure: URETEROSCOPY;  Surgeon: Garnett Farm, MD;  Location[facility];  Service: Urology;  Laterality: Right;  . Cystoscopy/retrograde/ureteroscopy  05/02/2012    Procedure: CYSTOSCOPY/RETROGRADE/URETEROSCOPY;  Surgeon: Garnett Farm, MD;  Location: [facility];  Service: Urology;  Laterality: Bilateral;  CYSTOSCOPY BILATERAL RETROGRADE PYLOGRAM BILATERAL URETEROSCOPY AND BIOPSY    . Cystoscopy with biopsy  05/02/2012    Procedure: CYSTOSCOPY WITH BIOPSY;  Surgeon: Garnett Farm, MD;  Location: [facility];  Service: Urology;  Laterality: N/A;  . Cystoscopy with stent placement  05/02/2012    Procedure: CYSTOSCOPY WITH STENT PLACEMENT;  Surgeon: Garnett Farm, MD;  Location: [facility];  Service: Urology;  Laterality: Bilateral;  . Pacemaker generator change  05/23/2002    [facility], [place] [id number], serial [id number]  . Lower extremity  arterial doppler  09/28/2011    No evidence of arterial insufficiency.   . Cardiovascular stress test  09/05/2009    Pharmalogical stress test w/o chest pain or EKG changes for ischemia  . Cardiac catheterization  09/20/2012    EF 50-55%, moderately calcified aortic valve leaflets, mild-moderate aortic valve stenosis, LA-appendage moderately dilated, moderate regurg of the mitral valve, RA moderately dilated    Family History  Problem Relation Age of Onset  . Heart disease Mother   . Cancer Father     Lung cancer  . Emphysema Brother   . Coronary artery disease Son     History   Social History  . Marital Status: Widowed    Spouse Name: N/A    Number of Children: N/A  . Years of Education: N/A   Occupational History  . Not on file.  Social History Main Topics  . Smoking status: Never Smoker   . Smokeless tobacco: Never Used  . Alcohol Use: 10.5 oz/week    21 drink(s) per week     Comment: 3 /day( bourbon)  . Drug Use: No  . Sexual Activity: No   Other Topics Concern  . Not on file   Social History Narrative  . No narrative on file    Review of systems: The patient specifically denies any chest pain  with exertion, dyspnea at rest or with exertion, orthopnea, paroxysmal nocturnal dyspnea, syncope, palpitations, focal neurological deficits, intermittent claudication, unexplained weight gain, cough, hemoptysis or wheezing.  The patient also denies abdominal pain, nausea, vomiting, dysphagia, diarrhea, constipation, polyuria, polydipsia, dysuria, hematuria, frequency, urgency, abnormal bleeding or bruising, fever, chills, unexpected weight changes, mood swings, change in skin or hair texture, change in voice quality, auditory or visual problems, allergic reactions or rashes, new musculoskeletal complaints other than usual "aches and pains".  PHYSICAL EXAM BP 100/60  Pulse 69  Resp 16  Ht 5' 9.5" (1.765 m)  Wt 181 lb 4.8 oz (82.237 kg)  BMI 26.40 kg/m2  General: Alert,  oriented x3, no distress Head: no evidence of trauma, PERRL, EOMI, no exophtalmos or lid lag, no myxedema, no xanthelasma; normal ears, nose and oropharynx Neck: normal jugular venous pulsations and no hepatojugular reflux; brisk carotid pulses without delay and no carotid bruits; scar of previous Parotid surgery and radiation therapy Chest: clear to auscultation, no signs of consolidation by percussion or palpation, normal fremitus, symmetrical and full respiratory excursions. Scar of right sided subclavian Port-A-Cath, healthy-appearing a left subclavian pacemaker site Cardiovascular: normal position and quality of the apical impulse, regular rhythm, normal first and paradoxically split second heart sounds, no  rubs or gallops. There is a grade 2-3/6 early peaking systolic ejection murmur in the aortic focus radiating toward the carotids and a fainter holosystolic murmur at the apex Abdomen: no tenderness or distention, no masses by palpation, no abnormal pulsatility or arterial bruits, normal bowel sounds, no hepatosplenomegaly Extremities: no clubbing, cyanosis or edema; 2+ radial, ulnar and brachial pulses bilaterally; 2+ right femoral, posterior tibial and dorsalis pedis pulses; 2+ left femoral, posterior tibial and dorsalis pedis pulses; no subclavian or femoral bruits Neurological: grossly nonfocal   EKG: Atrial fibrillation ventricular pacing  Lipid Panel     Component Value Date/Time   CHOL 115 10/03/2011 0600   TRIG 63 10/03/2011 0600   HDL 55 10/03/2011 0600   CHOLHDL 2.1 10/03/2011 0600   VLDL 13 10/03/2011 0600   LDLCALC 47 10/03/2011 0600    BMET    Component Value Date/Time   NA 138 03/28/2013 1302   NA 129* 02/26/2013 1650   K 4.7 03/28/2013 1302   K 3.8 02/26/2013 1650   CL 91* 02/26/2013 1650   CO2 24 03/28/2013 1302   CO2 25 02/26/2013 1650   GLUCOSE 124 03/28/2013 1302   GLUCOSE 128* 02/26/2013 1650   BUN 18.0 03/28/2013 1302   BUN 17 02/26/2013 1650   CREATININE 1.3 03/28/2013 1302    CREATININE 1.21 02/26/2013 1650   CALCIUM 9.9 03/28/2013 1302   CALCIUM 9.5 02/26/2013 1650   GFRNONAA 54* 02/26/2013 1650   GFRAA 63* 02/26/2013 1650     ASSESSMENT AND PLAN Permanent atrial fibrillation Not on warfarin secondary to traumatic subdural hematoma. Unfortunately he also has a history of transient ischemic attack which puts him at high risk for future cardioembolic events.  Pacemaker His generator is approaching replacement  indicator and we will start checking his device every 2 months. He is not pacemaker dependent. His ventricular lead is almost 77 years old and still working well, but he would probably benefit from implantation of a new lead that will allow auto capture. We will do this only if we can confirm patency of his left subclavian vein. Notes that his right subclavian vein has also been instrumented with a Port-A-Cath  Aortic stenosis Doppler parameters suggest that this is mild to moderate, but 2D imaging shows heavy calcification which may signify a tendency to rapid progression. Monitor for symptoms of progressive aortic stenosis (exertional angina, exertional dyspnea, exertional syncope).   Orders Placed This Encounter  Procedures  . Pacemaker Device Observation  . EKG 12-Lead   Patient Instructions  Your physician recommends that you schedule a follow-up appointment in: 2 months for a pacemaker battery check at the [facility] location, and in 6 months with MD     Junious Silk, MD, [facility] [phone] office [phone] pager

## 2013-04-29 NOTE — Assessment & Plan Note (Addendum)
Not on warfarin secondary to traumatic subdural hematoma. Unfortunately he also has a history of transient ischemic attack which puts him at high risk for future cardioembolic events.

## 2013-06-12 ENCOUNTER — Ambulatory Visit (INDEPENDENT_AMBULATORY_CARE_PROVIDER_SITE_OTHER): Payer: Medicare Other | Admitting: *Deleted

## 2013-06-12 DIAGNOSIS — I4891 Unspecified atrial fibrillation: Secondary | ICD-10-CM

## 2013-06-12 LAB — PACEMAKER DEVICE OBSERVATION

## 2013-06-12 LAB — MDC_IDC_ENUM_SESS_TYPE_INCLINIC
Battery Remaining Longevity: 0.5 mo — CL
Date Time Interrogation Session: 20141222050000
Implantable Pulse Generator Model: 1194

## 2013-06-12 NOTE — Progress Notes (Signed)
Patient presents to the office for a batt check. Estimated longevity <0.5 months. No changes made this session. Patient will follow up with the device clinic in 2 months and with [facility] in April 2015.

## 2013-08-14 ENCOUNTER — Encounter (INDEPENDENT_AMBULATORY_CARE_PROVIDER_SITE_OTHER): Payer: Self-pay

## 2013-08-14 ENCOUNTER — Other Ambulatory Visit: Payer: Self-pay | Admitting: Cardiovascular Disease

## 2013-08-14 ENCOUNTER — Ambulatory Visit (INDEPENDENT_AMBULATORY_CARE_PROVIDER_SITE_OTHER): Payer: Medicare Other | Admitting: *Deleted

## 2013-08-14 DIAGNOSIS — I4891 Unspecified atrial fibrillation: Secondary | ICD-10-CM

## 2013-08-14 LAB — MDC_IDC_ENUM_SESS_TYPE_INCLINIC: Implantable Pulse Generator Serial Number: 114623

## 2013-08-14 LAB — PACEMAKER DEVICE OBSERVATION

## 2013-08-14 NOTE — Progress Notes (Signed)
Pacemaker check in clinic.   Device @ [facility] 07/24/13.  ROV 2/23 with Dr. Sallyanne Kuster to discuss change out.

## 2013-08-16 ENCOUNTER — Encounter: Payer: Self-pay | Admitting: Cardiovascular Disease

## 2013-08-16 ENCOUNTER — Ambulatory Visit (INDEPENDENT_AMBULATORY_CARE_PROVIDER_SITE_OTHER): Payer: Medicare Other | Admitting: Cardiovascular Disease

## 2013-08-16 VITALS — BP 132/94 | HR 69 | Resp 20 | Ht 69.5 in | Wt 189.5 lb

## 2013-08-16 DIAGNOSIS — Z79899 Other long term (current) drug therapy: Secondary | ICD-10-CM

## 2013-08-16 DIAGNOSIS — D689 Coagulation defect, unspecified: Secondary | ICD-10-CM

## 2013-08-16 DIAGNOSIS — R5383 Other fatigue: Secondary | ICD-10-CM

## 2013-08-16 DIAGNOSIS — R5381 Other malaise: Secondary | ICD-10-CM

## 2013-08-16 DIAGNOSIS — I4891 Unspecified atrial fibrillation: Secondary | ICD-10-CM

## 2013-08-18 ENCOUNTER — Encounter: Payer: Self-pay | Admitting: Cardiovascular Disease

## 2013-08-18 NOTE — Progress Notes (Signed)
Patient ID: [name], male   DOB: 01-Jun-1930, 80 y.o.   MRN: [id number]      Reason for office visit Pacemaker reached ERI  [name] has a history of atrial fibrillation and a single-chamber permanent pacemaker secondary to slow ventricular response. He has no known problems with coronary disease and in 2001 had a normal myocardial perfusion study. He has preserved left ventricular systolic function but has mild to moderate calcific aortic valve stenosis (by echo April 2014 estimated valve area around 1.3 mean gradient 16 mm Hg) and moderate mitral insufficiency.  He has a history of subdural hematoma and is therefore not taking warfarin. He is on clopidogrel for stroke prevention. He has treated systemic hypertension and hyperlipidemia. 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  His pacemaker was initially implanted in 1990 with a generator change in 1996, at another generator change in 2003. His initial devices were dual-chamber, subsequently downgraded single-chamber devices for permanent atrial fibrillation. His current device is a [employer] model [id number]. Full pacemaker check in the office today shows device generator at ERI, but otherwise normal function. He is not pacemaker dependent and has a slow underlying ventricular rhythm at around 50 beats per minute. Ventricular pacing occurs 83% of the time.  His pacemaker lead is 78 years old but still has excellent impedance, pacing threshold and sensing. Unfortunately is not capable of auto capture. He has had multiple previous pacemaker procedures and has also had a right-sided Port-A-Cath. This has been removed. He complains of occasional ankle swelling generally worse  in the right ankle than the left and occasional sharp musculoskeletal type pain in his left chest. He denies shortness of breath exertional chest pain dizziness or syncope or palpitations.   Allergies  Allergen Reactions  . Codeine     Blisters between fingers  . Docetaxel Rash    Current Outpatient Prescriptions  Medication Sig Dispense Refill  . atorvastatin (LIPITOR) 40 MG tablet Take 40 mg by mouth every morning.       . clopidogrel (PLAVIX) 75 MG tablet Take 75 mg by mouth every morning.      . digoxin (LANOXIN) 0.25 MG tablet Take 0.125 mg by mouth every morning. Takes 1/2 tab      . finasteride (PROSCAR) 5 MG tablet Take 5 mg by mouth daily.      Marland Kitchen ibuprofen (ADVIL,MOTRIN) 200 MG tablet Take 400 mg by mouth every 8 (eight) hours as needed for pain.       . metoprolol succinate (TOPROL-XL) 25 MG 24 hr tablet Take 25 mg by mouth every morning. Alternate 1 tablet with 1/2 tablet daily      . Naproxen Sodium (ALEVE) 220 MG CAPS Take 220 mg by mouth at bedtime.       . pantoprazole (PROTONIX) 40 MG tablet Take 40 mg by mouth daily as needed (heart burn).       . ramipril (ALTACE) 10 MG capsule Take 10 mg by mouth every morning.        No current facility-administered medications for this visit.    Past Medical History  Diagnosis Date  . Atrial fibrillation 1990  . Dyslipidemia   . Prostate hypertrophy   . Arthritis     degenerative-knees  .  History of radiation therapy 05/05/07-06/15/07    right parotid through subclavicular region/ mid jugularlymph node chain  . Allergy   . Hypertension     labile  . Heart murmur     2/6 systolic  . History of chemotherapy      Hx carboplatin/77f  . Xerostomia     limited  . Stroke 2003    TIA  . Bladder cancer 2010  . Cancer 05/05/07-06/15/07    parotid gland/66.4 GY  . HOH (hard of hearing)   . Sleep apnea     Not diagnosied; pt denies 03/29/12  . Pacemaker     2003 vent paced , rate 69  . Chronic kidney disease     positive  cytology  . Dry eyes   . Diabetes mellitus without complication     meds d/c  1 year ago  . GERD (gastroesophageal reflux disease)   . History of radiation therapy 05/05/07-06/15/07    Right parotid/hemineck,supraclavicular    Past Surgical History  Procedure Laterality Date  . Pacemaker insertion  1990    last changed 2003  . Cystoscopy  01/2011    neg  . Back surgery    . Parotidectomy      right  . Craniotomy  12/14/2011    Procedure: CRANIOTOMY HEMATOMA EVACUATION SUBDURAL;  Surgeon: JOphelia Charter MD;  [facility];  Service: Neurosurgery;  Laterality: Left;  LEFT Craniotomy for subdural   . Neurofibroma excision  1980  . Cystoscopy with biopsy  04/01/2012    Procedure: CYSTOSCOPY WITH BIOPSY;  Surgeon: MClaybon Jabs MD;  [facility]  Service: Urology;  Laterality: N/A;  BLADDER BIOPSIES and bladder washings  . Cystoscopy w/ retrogrades  04/01/2012    Procedure: CYSTOSCOPY WITH RETROGRADE PYELOGRAM;  Surgeon: MClaybon Jabs MD;  [facility]  Service: Urology;  Laterality: Bilateral;  . Ureteroscopy  04/01/2012    Procedure: URETEROSCOPY;  Surgeon: MClaybon Jabs MD;  [facility]  Service: Urology;  Laterality: Right;  . Cystoscopy/retrograde/ureteroscopy  05/02/2012    Procedure: CYSTOSCOPY/RETROGRADE/URETEROSCOPY;  Surgeon: MClaybon Jabs MD;  [facility]  Service: U[facility];  Laterality: Bilateral;  CYSTOSCOPY BILATERAL RETROGRADE PYLOGRAM BILATERAL URETEROSCOPY AND BIOPSY    . Cystoscopy with biopsy  05/02/2012    Procedure: CYSTOSCOPY WITH BIOPSY;  Surgeon: MClaybon Jabs MD;  [facility]  Service: Urology;  Laterality: N/A;  . Cystoscopy with stent placement  05/02/2012    Procedure: CYSTOSCOPY WITH STENT PLACEMENT;  Surgeon: MClaybon Jabs MD;  [facility]  Service: Urology;  Laterality: Bilateral;  .  Pacemaker generator change  05/23/2002    [place][id number] . Lower extremity arterial doppler  09/28/2011    No evidence of arterial insufficiency.   . Cardiovascular stress test  09/05/2009    Pharmalogical stress test w/o chest pain or EKG changes for ischemia  . Cardiac catheterization  09/20/2012    EF 50-55%, moderately calcified aortic valve leaflets, mild-moderate aortic valve stenosis, LA-appendage moderately dilated, moderate regurg of the mitral valve, RA moderately dilated    Family History  Problem Relation Age of Onset  . Heart disease Mother   . Cancer Father     Lung cancer  . Emphysema Brother   . Coronary artery disease Son     History   Social History  . Marital Status: Widowed  Spouse Name: N/A    Number of Children: N/A  . Years of Education: N/A   Occupational History  . Not on file.   Social History Main Topics  . Smoking status: Never Smoker   . Smokeless tobacco: Never Used  . Alcohol Use: 10.5 oz/week    21 drink(s) per week     Comment: 3 /day( bourbon)  . Drug Use: No  . Sexual Activity: No   Other Topics Concern  . Not on file   Social History Narrative  . No narrative on file    Review of systems: The patient specifically denies any chest pain with exertion, dyspnea at rest or with exertion, orthopnea, paroxysmal nocturnal dyspnea, syncope, palpitations, focal neurological deficits, intermittent claudication, unexplained weight gain, cough, hemoptysis or wheezing.  The patient also denies abdominal pain, nausea, vomiting, dysphagia, diarrhea, constipation, polyuria, polydipsia, dysuria, hematuria, frequency, urgency, abnormal bleeding or bruising, fever, chills, unexpected weight changes, mood swings, change in skin or hair texture, change in voice quality, auditory or visual problems, allergic reactions or rashes, new musculoskeletal complaints other than usual "aches and pains".   PHYSICAL EXAM BP  132/94  Pulse 69  Resp 20  Ht 5' 9.5" (1.765 m)  Wt 85.957 kg (189 lb 8 oz)  BMI 27.59 kg/m2 General: Alert, oriented x3, no distress  Head: no evidence of trauma, PERRL, EOMI, no exophtalmos or lid lag, no myxedema, no xanthelasma; normal ears, nose and oropharynx  Neck: normal jugular venous pulsations and no hepatojugular reflux; brisk carotid pulses without delay and no carotid bruits; scar of previous Parotid surgery and radiation therapy  Chest: clear to auscultation, no signs of consolidation by percussion or palpation, normal fremitus, symmetrical and full respiratory excursions. Scar of right sided subclavian Port-A-Cath, healthy-appearing a left subclavian pacemaker site  Cardiovascular: normal position and quality of the apical impulse, regular rhythm, normal first and paradoxically split second heart sounds, no rubs or gallops. There is a grade 2-3/6 early peaking systolic ejection murmur in the aortic focus radiating toward the carotids and a fainter holosystolic murmur at the apex  Abdomen: no tenderness or distention, no masses by palpation, no abnormal pulsatility or arterial bruits, normal bowel sounds, no hepatosplenomegaly  Extremities: no clubbing, cyanosis or edema; 2+ radial, ulnar and brachial pulses bilaterally; 2+ right femoral, posterior tibial and dorsalis pedis pulses; 2+ left femoral, posterior tibial and dorsalis pedis pulses; no subclavian or femoral bruits  Neurological: grossly nonfocal   EKG: Atrial fibrillation ventricular pacing  Lipid Panel     Component Value Date/Time   CHOL 115 10/03/2011 0600   TRIG 63 10/03/2011 0600   HDL 55 10/03/2011 0600   CHOLHDL 2.1 10/03/2011 0600   VLDL 13 10/03/2011 0600   LDLCALC 47 10/03/2011 0600    BMET    Component Value Date/Time   NA 138 03/28/2013 1302   NA 129* 02/26/2013 1650   K 4.7 03/28/2013 1302   K 3.8 02/26/2013 1650   CL 91* 02/26/2013 1650   CO2 24 03/28/2013 1302   CO2 25 02/26/2013 1650   GLUCOSE 124  03/28/2013 1302   GLUCOSE 128* 02/26/2013 1650   BUN 18.0 03/28/2013 1302   BUN 17 02/26/2013 1650   CREATININE 1.3 03/28/2013 1302   CREATININE 1.21 02/26/2013 1650   CALCIUM 9.9 03/28/2013 1302   CALCIUM 9.5 02/26/2013 1650   GFRNONAA 54* 02/26/2013 1650   GFRAA 63* 02/26/2013 1650     ASSESSMENT AND PLAN  [name] has symptomatic bradycardia  due to atrial fibrillation with slow ventricular response and his pacemaker has reached elective replacement indicator. Although 80 years old, his ventricular lead is still working well. It would be preferable if his ventricular lead could allow auto capture, to give him the full benefit of a moderate device. On the other hand venous access issues are to be anticipated. He has had multiple lead revisions and even a lead extraction from the patient's description his left subclavian atrial lead was removed via the femoral approach). He has also had a right subclavian Port-A-Cath, subsequently removed.  All in all, things he would best served by simple generator change out without replacement of his lead unless this is found to be not functioning at the time of device change.  This procedure has been fully reviewed with the patient and informed consent has been obtained.   Orders Placed This Encounter  Procedures  . CBC  . Comp Met (CMET)  . Protime-INR  . PTT  . EKG 12-Lead  . PACEMAKER GENERATOR CHANGE   Meds ordered this encounter  Medications  . finasteride (PROSCAR) 5 MG tablet    Sig: Take 5 mg by mouth daily.    Holli Humbles, MD, [place] [phone] office [phone] pager

## 2013-08-23 ENCOUNTER — Other Ambulatory Visit: Payer: Self-pay | Admitting: *Deleted

## 2013-08-23 DIAGNOSIS — Z45010 Encounter for checking and testing of cardiac pacemaker pulse generator [battery]: Secondary | ICD-10-CM

## 2013-08-23 DIAGNOSIS — I4891 Unspecified atrial fibrillation: Secondary | ICD-10-CM

## 2013-08-24 ENCOUNTER — Encounter (HOSPITAL_COMMUNITY): Payer: Self-pay

## 2013-08-29 ENCOUNTER — Telehealth: Payer: Self-pay | Admitting: Cardiovascular Disease

## 2013-08-29 LAB — CBC
HCT: 37.9 % — ABNORMAL LOW (ref 39.0–52.0)
Hemoglobin: 12.9 g/dL — ABNORMAL LOW (ref 13.0–17.0)
MCH: 30.6 pg (ref 26.0–34.0)
MCHC: 34 g/dL (ref 30.0–36.0)
MCV: 89.8 fL (ref 78.0–100.0)
PLATELETS: 184 10*3/uL (ref 150–400)
RBC: 4.22 MIL/uL (ref 4.22–5.81)
RDW: 14.5 % (ref 11.5–15.5)
WBC: 6.2 10*3/uL (ref 4.0–10.5)

## 2013-08-29 NOTE — Telephone Encounter (Signed)
Spoke w/ Pamala Hurry, CMA.  Informed pt does NOT need CXR.  Call back to [facility] and informed.  Verbalized understanding and will inform pt.

## 2013-08-29 NOTE — Telephone Encounter (Signed)
Call from [facility] w/ [place].  Stated pt brought in instructions to have CXR, but there is no order.  Informed Pamala Hurry, CMA is in a room w/ another pt right now.  Will have her review to find out if CXR needed.  Verbalized understanding.

## 2013-08-30 LAB — PROTIME-INR
INR: 1.05 (ref <1.50)
Prothrombin Time: 13.6 seconds (ref 11.6–15.2)

## 2013-08-30 LAB — COMPREHENSIVE METABOLIC PANEL
ALK PHOS: 111 U/L (ref 39–117)
ALT: 10 U/L (ref 0–53)
AST: 15 U/L (ref 0–37)
Albumin: 4 g/dL (ref 3.5–5.2)
BILIRUBIN TOTAL: 0.9 mg/dL (ref 0.2–1.2)
BUN: 21 mg/dL (ref 6–23)
CO2: 30 mEq/L (ref 19–32)
CREATININE: 0.92 mg/dL (ref 0.50–1.35)
Calcium: 9.7 mg/dL (ref 8.4–10.5)
Chloride: 102 mEq/L (ref 96–112)
Glucose, Bld: 112 mg/dL — ABNORMAL HIGH (ref 70–99)
Potassium: 4.2 mEq/L (ref 3.5–5.3)
SODIUM: 140 meq/L (ref 135–145)
Total Protein: 6.8 g/dL (ref 6.0–8.3)

## 2013-08-30 LAB — APTT: aPTT: 31 seconds (ref 24–37)

## 2013-08-31 MED ORDER — DIAZEPAM 5 MG PO TABS
5.0000 mg | ORAL_TABLET | ORAL | Status: AC
Start: 2013-08-31 — End: 2013-09-01
  Administered 2013-09-01: 5 mg via ORAL
  Filled 2013-08-31: qty 1

## 2013-08-31 MED ORDER — CEFAZOLIN SODIUM-DEXTROSE 2-3 GM-% IV SOLR
2.0000 g | INTRAVENOUS | Status: DC
  Filled 2013-08-31: qty 50

## 2013-08-31 MED ORDER — GENTAMICIN SULFATE 40 MG/ML IJ SOLN
80.0000 mg | INTRAMUSCULAR | Status: DC
  Filled 2013-08-31: qty 2

## 2013-09-01 ENCOUNTER — Ambulatory Visit (HOSPITAL_COMMUNITY)
Admission: RE | Admit: 2013-09-01 | Discharge: 2013-09-02 | Disposition: A | Discharge status: Home / Self Care | Payer: Medicare Other | Source: Ambulatory Visit | Attending: Cardiovascular Disease | Admitting: Cardiovascular Disease

## 2013-09-01 ENCOUNTER — Encounter (HOSPITAL_COMMUNITY): Payer: Self-pay | Admitting: *Deleted

## 2013-09-01 ENCOUNTER — Encounter (HOSPITAL_COMMUNITY)
Admission: RE | Disposition: A | Discharge status: Home / Self Care | Payer: Self-pay | Source: Ambulatory Visit | Attending: Cardiovascular Disease

## 2013-09-01 DIAGNOSIS — Z8589 Personal history of malignant neoplasm of other organs and systems: Secondary | ICD-10-CM | POA: Insufficient documentation

## 2013-09-01 DIAGNOSIS — N400 Enlarged prostate without lower urinary tract symptoms: Secondary | ICD-10-CM | POA: Insufficient documentation

## 2013-09-01 DIAGNOSIS — E119 Type 2 diabetes mellitus without complications: Secondary | ICD-10-CM | POA: Insufficient documentation

## 2013-09-01 DIAGNOSIS — K219 Gastro-esophageal reflux disease without esophagitis: Secondary | ICD-10-CM | POA: Insufficient documentation

## 2013-09-01 DIAGNOSIS — C679 Malignant neoplasm of bladder, unspecified: Secondary | ICD-10-CM

## 2013-09-01 DIAGNOSIS — Z950 Presence of cardiac pacemaker: Secondary | ICD-10-CM

## 2013-09-01 DIAGNOSIS — I350 Nonrheumatic aortic (valve) stenosis: Secondary | ICD-10-CM

## 2013-09-01 DIAGNOSIS — I498 Other specified cardiac arrhythmias: Secondary | ICD-10-CM | POA: Insufficient documentation

## 2013-09-01 DIAGNOSIS — Z8551 Personal history of malignant neoplasm of bladder: Secondary | ICD-10-CM | POA: Insufficient documentation

## 2013-09-01 DIAGNOSIS — N189 Chronic kidney disease, unspecified: Secondary | ICD-10-CM | POA: Insufficient documentation

## 2013-09-01 DIAGNOSIS — Z923 Personal history of irradiation: Secondary | ICD-10-CM | POA: Insufficient documentation

## 2013-09-01 DIAGNOSIS — S065XAA Traumatic subdural hemorrhage with loss of consciousness status unknown, initial encounter: Secondary | ICD-10-CM

## 2013-09-01 DIAGNOSIS — I129 Hypertensive chronic kidney disease with stage 1 through stage 4 chronic kidney disease, or unspecified chronic kidney disease: Secondary | ICD-10-CM | POA: Insufficient documentation

## 2013-09-01 DIAGNOSIS — F10931 Alcohol use, unspecified with withdrawal delirium: Secondary | ICD-10-CM

## 2013-09-01 DIAGNOSIS — Z45010 Encounter for checking and testing of cardiac pacemaker pulse generator [battery]: Secondary | ICD-10-CM

## 2013-09-01 DIAGNOSIS — R8289 Other abnormal findings on cytological and histological examination of urine: Secondary | ICD-10-CM

## 2013-09-01 DIAGNOSIS — R972 Elevated prostate specific antigen [PSA]: Secondary | ICD-10-CM

## 2013-09-01 DIAGNOSIS — I10 Essential (primary) hypertension: Secondary | ICD-10-CM

## 2013-09-01 DIAGNOSIS — T82190A Other mechanical complication of cardiac electrode, initial encounter: Secondary | ICD-10-CM

## 2013-09-01 DIAGNOSIS — G459 Transient cerebral ischemic attack, unspecified: Secondary | ICD-10-CM

## 2013-09-01 DIAGNOSIS — F1010 Alcohol abuse, uncomplicated: Secondary | ICD-10-CM

## 2013-09-01 DIAGNOSIS — I4891 Unspecified atrial fibrillation: Secondary | ICD-10-CM

## 2013-09-01 DIAGNOSIS — F10231 Alcohol dependence with withdrawal delirium: Secondary | ICD-10-CM

## 2013-09-01 DIAGNOSIS — I080 Rheumatic disorders of both mitral and aortic valves: Secondary | ICD-10-CM | POA: Insufficient documentation

## 2013-09-01 DIAGNOSIS — J810 Acute pulmonary edema: Secondary | ICD-10-CM

## 2013-09-01 DIAGNOSIS — Z45018 Encounter for adjustment and management of other part of cardiac pacemaker: Secondary | ICD-10-CM | POA: Insufficient documentation

## 2013-09-01 DIAGNOSIS — C07 Malignant neoplasm of parotid gland: Secondary | ICD-10-CM

## 2013-09-01 DIAGNOSIS — S065X9A Traumatic subdural hemorrhage with loss of consciousness of unspecified duration, initial encounter: Secondary | ICD-10-CM

## 2013-09-01 DIAGNOSIS — R011 Cardiac murmur, unspecified: Secondary | ICD-10-CM | POA: Insufficient documentation

## 2013-09-01 DIAGNOSIS — C801 Malignant (primary) neoplasm, unspecified: Secondary | ICD-10-CM

## 2013-09-01 DIAGNOSIS — D473 Essential (hemorrhagic) thrombocythemia: Secondary | ICD-10-CM

## 2013-09-01 DIAGNOSIS — H9190 Unspecified hearing loss, unspecified ear: Secondary | ICD-10-CM | POA: Insufficient documentation

## 2013-09-01 DIAGNOSIS — Z8673 Personal history of transient ischemic attack (TIA), and cerebral infarction without residual deficits: Secondary | ICD-10-CM | POA: Insufficient documentation

## 2013-09-01 DIAGNOSIS — D75839 Thrombocytosis, unspecified: Secondary | ICD-10-CM

## 2013-09-01 DIAGNOSIS — Z7902 Long term (current) use of antithrombotics/antiplatelets: Secondary | ICD-10-CM | POA: Insufficient documentation

## 2013-09-01 DIAGNOSIS — N390 Urinary tract infection, site not specified: Secondary | ICD-10-CM

## 2013-09-01 DIAGNOSIS — E785 Hyperlipidemia, unspecified: Secondary | ICD-10-CM

## 2013-09-01 HISTORY — PX: PACEMAKER GENERATOR CHANGE: SHX5481

## 2013-09-01 LAB — SURGICAL PCR SCREEN
MRSA, PCR: NEGATIVE
Staphylococcus aureus: NEGATIVE

## 2013-09-01 SURGERY — PACEMAKER GENERATOR CHANGE
Anesthesia: LOCAL

## 2013-09-01 MED ORDER — FINASTERIDE 5 MG PO TABS
5.0000 mg | ORAL_TABLET | Freq: Every day | ORAL | Status: DC
  Administered 2013-09-02: 11:00:00 5 mg via ORAL
  Filled 2013-09-01: qty 1

## 2013-09-01 MED ORDER — YOU HAVE A PACEMAKER BOOK
Freq: Once | Status: AC
  Administered 2013-09-01: 20:00:00
  Filled 2013-09-01: qty 1

## 2013-09-01 MED ORDER — ONDANSETRON HCL 4 MG/2ML IJ SOLN: 4.0000 mg | Freq: Four times a day (QID) | INTRAMUSCULAR | Status: DC | PRN

## 2013-09-01 MED ORDER — CLOPIDOGREL BISULFATE 75 MG PO TABS
75.0000 mg | ORAL_TABLET | Freq: Every day | ORAL | Status: DC
  Administered 2013-09-02: 08:00:00 75 mg via ORAL
  Filled 2013-09-01: qty 1

## 2013-09-01 MED ORDER — MUPIROCIN 2 % EX OINT
TOPICAL_OINTMENT | CUTANEOUS | Status: AC
  Filled 2013-09-01: qty 22

## 2013-09-01 MED ORDER — DIGOXIN 125 MCG PO TABS
0.1250 mg | ORAL_TABLET | Freq: Every morning | ORAL | Status: DC
  Administered 2013-09-02: 11:00:00 0.125 mg via ORAL
  Filled 2013-09-01: qty 1

## 2013-09-01 MED ORDER — METOPROLOL SUCCINATE 12.5 MG HALF TABLET
12.5000 mg | ORAL_TABLET | ORAL | Status: DC
  Administered 2013-09-02: 11:00:00 12.5 mg via ORAL
  Filled 2013-09-01: qty 1

## 2013-09-01 MED ORDER — HEPARIN (PORCINE) IN NACL 2-0.9 UNIT/ML-% IJ SOLN
INTRAMUSCULAR | Status: AC
Start: 2013-09-01 — End: 2013-09-01
  Filled 2013-09-01: qty 1000

## 2013-09-01 MED ORDER — CEFAZOLIN SODIUM 1-5 GM-% IV SOLN
1.0000 g | Freq: Four times a day (QID) | INTRAVENOUS | Status: AC
  Administered 2013-09-01 – 2013-09-02 (×3): 1 g via INTRAVENOUS
  Filled 2013-09-01 (×3): qty 50

## 2013-09-01 MED ORDER — MIDAZOLAM HCL 2 MG/2ML IJ SOLN
INTRAMUSCULAR | Status: AC
  Filled 2013-09-01: qty 2

## 2013-09-01 MED ORDER — LIDOCAINE HCL (PF) 1 % IJ SOLN
INTRAMUSCULAR | Status: AC
  Filled 2013-09-01: qty 60

## 2013-09-01 MED ORDER — ACETAMINOPHEN 325 MG PO TABS
325.0000 mg | ORAL_TABLET | ORAL | Status: DC | PRN
Start: 2013-09-01 — End: 2013-09-02
  Administered 2013-09-02: 650 mg via ORAL
  Filled 2013-09-01: qty 2

## 2013-09-01 MED ORDER — RAMIPRIL 10 MG PO CAPS
10.0000 mg | ORAL_CAPSULE | Freq: Every morning | ORAL | Status: DC
  Administered 2013-09-02: 10 mg via ORAL
  Filled 2013-09-01: qty 1

## 2013-09-01 MED ORDER — ATORVASTATIN CALCIUM 40 MG PO TABS
40.0000 mg | ORAL_TABLET | Freq: Every morning | ORAL | Status: DC
  Administered 2013-09-02: 11:00:00 40 mg via ORAL
  Filled 2013-09-01: qty 1

## 2013-09-01 MED ORDER — MUPIROCIN 2 % EX OINT: TOPICAL_OINTMENT | Freq: Every day | CUTANEOUS | Status: DC

## 2013-09-01 MED ORDER — HYDROCODONE-ACETAMINOPHEN 5-325 MG PO TABS: 1.0000 | ORAL_TABLET | ORAL | Status: DC | PRN

## 2013-09-01 MED ORDER — METOPROLOL SUCCINATE 12.5 MG HALF TABLET: 12.5000 mg | ORAL_TABLET | Freq: Every day | ORAL | Status: DC

## 2013-09-01 MED ORDER — METOPROLOL SUCCINATE ER 25 MG PO TB24: 25.0000 mg | ORAL_TABLET | ORAL | Status: DC

## 2013-09-01 MED ORDER — FENTANYL CITRATE 0.05 MG/ML IJ SOLN
INTRAMUSCULAR | Status: AC
  Filled 2013-09-01: qty 2

## 2013-09-01 MED ORDER — SODIUM CHLORIDE 0.9 % IJ SOLN: 3.0000 mL | INTRAMUSCULAR | Status: DC | PRN

## 2013-09-01 MED ORDER — SODIUM CHLORIDE 0.9 % IV SOLN
INTRAVENOUS | Status: DC
  Administered 2013-09-01: 50 mL/h via INTRAVENOUS

## 2013-09-01 NOTE — H&P (View-Only) (Signed)
Patient ID: [name], male   DOB: [birth year], 80 y.o.   MRN: [id number]      Reason for office visit Pacemaker reached ERI  Mr. Ludington has a history of atrial fibrillation and a single-chamber permanent pacemaker secondary to slow ventricular response. He has no known problems with coronary disease and in 2001 had a normal myocardial perfusion study. He has preserved left ventricular systolic function but has mild to moderate calcific aortic valve stenosis (by echo April 2014 estimated valve area around 1.3 mean gradient 16 mm Hg) and moderate mitral insufficiency.  He has a history of subdural hematoma and is therefore not taking warfarin. He is on clopidogrel for stroke prevention. He has treated systemic hypertension and hyperlipidemia. 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  His pacemaker was initially implanted in 1990 with a generator change in 1996, at another generator change in 2003. His initial devices were dual-chamber, subsequently downgraded single-chamber devices for permanent atrial fibrillation. His current device is a [employer] model [id number]. Full pacemaker check in the office today shows device generator at ERI, but otherwise normal function. He is not pacemaker dependent and has a slow underlying ventricular rhythm at around 50 beats per minute. Ventricular pacing occurs 83% of the time.  His pacemaker lead is 78 years old but still has excellent impedance, pacing threshold and sensing. Unfortunately is not capable of auto capture. He has had multiple previous pacemaker procedures and has also had a right-sided Port-A-Cath. This has been removed. He complains of occasional ankle swelling generally worse  in the right ankle than the left and occasional sharp musculoskeletal type pain in his left chest. He denies shortness of breath exertional chest pain dizziness or syncope or palpitations.   Allergies  Allergen Reactions  . Codeine     Blisters between fingers  . Docetaxel Rash    Current Outpatient Prescriptions  Medication Sig Dispense Refill  . atorvastatin (LIPITOR) 40 MG tablet Take 40 mg by mouth every morning.       . clopidogrel (PLAVIX) 75 MG tablet Take 75 mg by mouth every morning.      . digoxin (LANOXIN) 0.25 MG tablet Take 0.125 mg by mouth every morning. Takes 1/2 tab      . finasteride (PROSCAR) 5 MG tablet Take 5 mg by mouth daily.      Marland Kitchen ibuprofen (ADVIL,MOTRIN) 200 MG tablet Take 400 mg by mouth every 8 (eight) hours as needed for pain.       . metoprolol succinate (TOPROL-XL) 25 MG 24 hr tablet Take 25 mg by mouth every morning. Alternate 1 tablet with 1/2 tablet daily      . Naproxen Sodium (ALEVE) 220 MG CAPS Take 220 mg by mouth at bedtime.       . pantoprazole (PROTONIX) 40 MG tablet Take 40 mg by mouth daily as needed (heart burn).       . ramipril (ALTACE) 10 MG capsule Take 10 mg by mouth every morning.        No current facility-administered medications for this visit.    Past Medical History  Diagnosis Date  . Atrial fibrillation 1990  . Dyslipidemia   . Prostate hypertrophy   . Arthritis     degenerative-knees  .  History of radiation therapy 05/05/07-06/15/07    right parotid through subclavicular region/ mid jugularlymph node chain  . Allergy   . Hypertension     labile  . Heart murmur     2/6 systolic  . History of chemotherapy      Hx carboplatin/75f  . Xerostomia     limited  . Stroke 2003    TIA  . Bladder cancer 2010  . Cancer 05/05/07-06/15/07    parotid gland/66.4 GY  . HOH (hard of hearing)   . Sleep apnea     Not diagnosied; pt denies 03/29/12  . Pacemaker     2003 vent paced , rate 69  . Chronic kidney disease     positive  cytology  . Dry eyes   . Diabetes mellitus without complication     meds d/c  1 year ago  . GERD (gastroesophageal reflux disease)   . History of radiation therapy 05/05/07-06/15/07    Right parotid/hemineck,supraclavicular    Past Surgical History  Procedure Laterality Date  . Pacemaker insertion  1990    last changed 2003  . Cystoscopy  01/2011    neg  . Back surgery    . Parotidectomy      right  . Craniotomy  12/14/2011    Procedure: CRANIOTOMY HEMATOMA EVACUATION SUBDURAL;  Surgeon: JOphelia Charter MD;  [facility];  Service: Neurosurgery;  Laterality: Left;  LEFT Craniotomy for subdural   . Neurofibroma excision  1980  . Cystoscopy with biopsy  04/01/2012    Procedure: CYSTOSCOPY WITH BIOPSY;  Surgeon: MClaybon Jabs MD;  [facility]  Service: Urology;  Laterality: N/A;  BLADDER BIOPSIES and bladder washings  . Cystoscopy w/ retrogrades  04/01/2012    Procedure: CYSTOSCOPY WITH RETROGRADE PYELOGRAM;  Surgeon: MClaybon Jabs MD;  [facility]  Service: Urology;  Laterality: Bilateral;  . Ureteroscopy  04/01/2012    Procedure: URETEROSCOPY;  Surgeon: MClaybon Jabs MD;  [facility]  Service: Urology;  Laterality: Right;  . Cystoscopy/retrograde/ureteroscopy  05/02/2012    Procedure: CYSTOSCOPY/RETROGRADE/URETEROSCOPY;  Surgeon: MClaybon Jabs MD;  [facility]  Service: Urology;  Laterality: Bilateral;  CYSTOSCOPY BILATERAL RETROGRADE PYLOGRAM BILATERAL URETEROSCOPY AND BIOPSY    . Cystoscopy with biopsy  05/02/2012    Procedure: CYSTOSCOPY WITH BIOPSY;  Surgeon: MClaybon Jabs MD;  [facility]  Service: Urology;  Laterality: N/A;  . Cystoscopy with stent placement  05/02/2012    Procedure: CYSTOSCOPY WITH STENT PLACEMENT;  Surgeon: MClaybon Jabs MD;  [facility]  Service: [facility];  Laterality: Bilateral;  .  Pacemaker generator change  05/23/2002    BPennock mTelford serial #[id number] . Lower extremity arterial doppler  09/28/2011    No evidence of arterial insufficiency.   . Cardiovascular stress test  09/05/2009    Pharmalogical stress test w/o chest pain or EKG changes for ischemia  . Cardiac catheterization  09/20/2012    EF 50-55%, moderately calcified aortic valve leaflets, mild-moderate aortic valve stenosis, LA-appendage moderately dilated, moderate regurg of the mitral valve, RA moderately dilated    Family History  Problem Relation Age of Onset  . Heart disease Mother   . Cancer Father     Lung cancer  . Emphysema Brother   . Coronary artery disease Son     History   Social History  . Marital Status: Widowed  Spouse Name: N/A    Number of Children: N/A  . Years of Education: N/A   Occupational History  . Not on file.   Social History Main Topics  . Smoking status: Never Smoker   . Smokeless tobacco: Never Used  . Alcohol Use: 10.5 oz/week    21 drink(s) per week     Comment: 3 /day( bourbon)  . Drug Use: No  . Sexual Activity: No   Other Topics Concern  . Not on file   Social History Narrative  . No narrative on file    Review of systems: The patient specifically denies any chest pain with exertion, dyspnea at rest or with exertion, orthopnea, paroxysmal nocturnal dyspnea, syncope, palpitations, focal neurological deficits, intermittent claudication, unexplained weight gain, cough, hemoptysis or wheezing.  The patient also denies abdominal pain, nausea, vomiting, dysphagia, diarrhea, constipation, polyuria, polydipsia, dysuria, hematuria, frequency, urgency, abnormal bleeding or bruising, fever, chills, unexpected weight changes, mood swings, change in skin or hair texture, change in voice quality, auditory or visual problems, allergic reactions or rashes, new musculoskeletal complaints other than usual "aches and pains".   PHYSICAL EXAM BP  132/94  Pulse 69  Resp 20  Ht 5' 9.5" (1.765 m)  Wt 85.957 kg (189 lb 8 oz)  BMI 27.59 kg/m2 General: Alert, oriented x3, no distress  Head: no evidence of trauma, PERRL, EOMI, no exophtalmos or lid lag, no myxedema, no xanthelasma; normal ears, nose and oropharynx  Neck: normal jugular venous pulsations and no hepatojugular reflux; brisk carotid pulses without delay and no carotid bruits; scar of previous Parotid surgery and radiation therapy  Chest: clear to auscultation, no signs of consolidation by percussion or palpation, normal fremitus, symmetrical and full respiratory excursions. Scar of right sided subclavian Port-A-Cath, healthy-appearing a left subclavian pacemaker site  Cardiovascular: normal position and quality of the apical impulse, regular rhythm, normal first and paradoxically split second heart sounds, no rubs or gallops. There is a grade 2-3/6 early peaking systolic ejection murmur in the aortic focus radiating toward the carotids and a fainter holosystolic murmur at the apex  Abdomen: no tenderness or distention, no masses by palpation, no abnormal pulsatility or arterial bruits, normal bowel sounds, no hepatosplenomegaly  Extremities: no clubbing, cyanosis or edema; 2+ radial, ulnar and brachial pulses bilaterally; 2+ right femoral, posterior tibial and dorsalis pedis pulses; 2+ left femoral, posterior tibial and dorsalis pedis pulses; no subclavian or femoral bruits  Neurological: grossly nonfocal   EKG: Atrial fibrillation ventricular pacing  Lipid Panel     Component Value Date/Time   CHOL 115 10/03/2011 0600   TRIG 63 10/03/2011 0600   HDL 55 10/03/2011 0600   CHOLHDL 2.1 10/03/2011 0600   VLDL 13 10/03/2011 0600   LDLCALC 47 10/03/2011 0600    BMET    Component Value Date/Time   NA 138 03/28/2013 1302   NA 129* 02/26/2013 1650   K 4.7 03/28/2013 1302   K 3.8 02/26/2013 1650   CL 91* 02/26/2013 1650   CO2 24 03/28/2013 1302   CO2 25 02/26/2013 1650   GLUCOSE 124  03/28/2013 1302   GLUCOSE 128* 02/26/2013 1650   BUN 18.0 03/28/2013 1302   BUN 17 02/26/2013 1650   CREATININE 1.3 03/28/2013 1302   CREATININE 1.21 02/26/2013 1650   CALCIUM 9.9 03/28/2013 1302   CALCIUM 9.5 02/26/2013 1650   GFRNONAA 54* 02/26/2013 1650   GFRAA 63* 02/26/2013 1650     ASSESSMENT AND PLAN  Mr. Hail has symptomatic bradycardia  due to atrial fibrillation with slow ventricular response and his pacemaker has reached elective replacement indicator. Although 80 years old, his ventricular lead is still working well. It would be preferable if his ventricular lead could allow auto capture, to give him the full benefit of a moderate device. On the other hand venous access issues are to be anticipated. He has had multiple lead revisions and even a lead extraction from the patient's description his left subclavian atrial lead was removed via the femoral approach). He has also had a right subclavian Port-A-Cath, subsequently removed.  All in all, things he would best served by simple generator change out without replacement of his lead unless this is found to be not functioning at the time of device change.  This procedure has been fully reviewed with the patient and informed consent has been obtained.   Orders Placed This Encounter  Procedures  . CBC  . Comp Met (CMET)  . Protime-INR  . PTT  . EKG 12-Lead  . PACEMAKER GENERATOR CHANGE   Meds ordered this encounter  Medications  . finasteride (PROSCAR) 5 MG tablet    Sig: Take 5 mg by mouth daily.    Holli Humbles, MD, [place] [phone] office [phone] pager

## 2013-09-01 NOTE — Interval H&P Note (Signed)
History and Physical Interval Note:  09/01/2013 11:41 AM  [name]  has presented today for surgery, with the diagnosis of End of life  The various methods of treatment have been discussed with the patient and family. After consideration of risks, benefits and other options for treatment, the patient has consented to  Procedure(s): PACEMAKER GENERATOR CHANGE (N/A) as a surgical intervention .  The patient's history has been reviewed, patient examined, no change in status, stable for surgery.  I have reviewed the patient's chart and labs.  Questions were answered to the patient's satisfaction.     [name]

## 2013-09-01 NOTE — Discharge Instructions (Signed)

## 2013-09-01 NOTE — Progress Notes (Signed)
Follow up wound check scheduled Monday 09/11/13 at [facility] office

## 2013-09-01 NOTE — Op Note (Signed)
Procedure report  Procedure performed:  1. Implantation of new single chamber permanent pacemaker 2. Fluoroscopy 3. Light sedation 4. Left upper extremity venography  Reason for procedure:  Symptomatic bradycardia due to atrial fibrillation with slow ventricular response Generator at ERI Malfunction of old ventricular lead  Procedure performed by: Sanda Klein, MD  Complications: None  Estimated blood loss: 30 mL  Medications administered during procedure: Ancef 2 g intravenously, Lidocaine 1% 30 mL locally, Fentanyl 25 mcg intravenously,  Versed 1 mg intravenously Omnipaque 10 mL  Device details: [employer] model [id number] serial number [id number] Right ventricular lead Guidant Dextrus 0932 serial number [id number]  Explanted Guidant 9983, SN# [id number] Old Right ventricular lead (insulation malfunction, capped) [employer] 430-02  Procedure details:  After the risks and benefits of the procedure were discussed the patient provided informed consent and was brought to the cardiac cath lab in the fasting state. The patient was prepped and draped in usual sterile fashion. Local anesthesia with 1% lidocaine was administered to to the left infraclavicular area, just caudal to an older scar. A 5-6 cm horizontal incision was made parallel with and 2-3 cm caudal to the left clavicle. Using minimal electrocautery and mostly blunt dissection the prepectoral pocket was opened down to the level of the pectoralis major muscle fascia. Care taken not to injure the old leadsThe pocket was carefully inspected for hemostasis.   The old device was explanted and the two set screws were fully retracted. When attempts were made to remove the old lead from the old generator, the inner conductor remained attached to the device, while the outer coli and insulation retracted, exposing the inner conductor. The lead continued to operate in unipolar mode, but was clearly no longer usable  long term. The decision was made to place a new ventricular lead. The left subclavian vein was confirmed as patent with venography.  Under fluoroscopic guidance and using the modified Seldinger technique a single venipuncture was performed to access the left subclavian vein. No difficulty was encountered accessing the vein.  A J-tip guidewire was were subsequently exchanged for a 7 [place] safe sheath. Due to friction at the subclavian area, the lead could not be well manipulated and the sheath was "upsized" to a 77F long sheath.  Under fluoroscopic guidance the ventricular lead was advanced to level of the mid to apical right ventricular septum and thet active-fixation helix was deployed. Prominent current of injury was seen. Satisfactory pacing and sensing parameters were recorded. There was no evidence of diaphragmatic stimulation at maximum device output. The safe sheath was peeled away and the lead was secured in place with 2-0 silk.  The pocket was flushed with copious amounts of antibiotic solution. Reinspection showed excellent hemostasis. The new RV lead was connected to the new generator. The old RV lead was capped. There is already a capped abandoned RA lead in the pocket.  The ventricular lead was connected to the generator and appropriate ventricular pacing was seen. Repeat testing of the lead parameters via telemetry showed excellent values.  The entire system was then carefully inserted in the pocket with care been taking that the leads and device assumed a comfortable position without pressure on the incision. Great care was taken that the leads be located deep to the generator. The pocket was then closed in layers using 2 layers of 2-0 Vicryl and cutaneous staples, after which a sterile dressing was applied.  At the end of the procedure the following lead parameters were encountered:  Right ventricular lead sensed R waves 15.5 mV, impedance 909 ohms, threshold 0.9 V at 0.4 ms pulse  width.  Sanda Klein, MD, [facility] [phone] office [phone] pager

## 2013-09-02 ENCOUNTER — Ambulatory Visit (HOSPITAL_COMMUNITY): Payer: Medicare Other

## 2013-09-02 DIAGNOSIS — I10 Essential (primary) hypertension: Secondary | ICD-10-CM

## 2013-09-02 MED ORDER — ACETAMINOPHEN 325 MG PO TABS: 325.0000 mg | ORAL_TABLET | ORAL | Status: DC | PRN

## 2013-09-02 MED ORDER — HYDROCODONE-ACETAMINOPHEN 5-325 MG PO TABS: 1.0000 | ORAL_TABLET | ORAL | Status: DC | PRN

## 2013-09-02 NOTE — Progress Notes (Signed)
Pacer site left upper chest.  Pressure dressing (partially saturated with blood) removed to evaluate site and apply iodine to incision. Incision edges approximated, moderate bruising.  Upon initiation of activity by patient, mild oozing resumed.  Applied fresh pressure dressing.  Patient and family given instructions regarding incision monitoring and follow up.  Jory Sims, NP, aware and agrees with action and -plan.  Patient [place] home.

## 2013-09-02 NOTE — Progress Notes (Signed)
Subjective: Patient denies CP  No SOB Objective: Filed Vitals:   09/01/13 1930 09/01/13 2000 09/01/13 2353 09/02/13 0459  BP:  140/57 140/62 135/73  Pulse: 60 60 62 62  Temp:  97.3 F (36.3 C) 98.1 F (36.7 C) 97.9 F (36.6 C)  TempSrc:  Oral Oral Oral  Resp:  18 18 20   Height:      Weight:   186 lb 4.6 oz (84.5 kg)   SpO2: 95% 97% 99% 93%   Weight change:   Intake/Output Summary (Last 24 hours) at 09/02/13 0738 Last data filed at 09/01/13 2300  Gross per 24 hour  Intake    840 ml  Output      0 ml  Net    840 ml    General: Alert, awake, oriented x3, in no acute distress Neck:  JVP is normal Heart: Regular rate and rhythm, without murmurs, rubs, gallops.  Pacer site:  Sl blood.  No hematoma.   Lungs: Clear to auscultation.  No rales or wheezes. Exemities:  No edema.   Neuro: Grossly intact, nonfocal.   Lab Results: Results for orders placed during the hospital encounter of 09/01/13 (from the past 24 hour(s))  SURGICAL PCR SCREEN     Status: None   Collection Time    09/01/13 12:31 PM      Result Value Ref Range   MRSA, PCR NEGATIVE  NEGATIVE   Staphylococcus aureus NEGATIVE  NEGATIVE    Studies/Results: @RISRSLT24 @  Medications: Reviewed   @PROBHOSP @  CXR  No pneumothorax  1  Bradycardia.  Previous generated at [facility]   Pateint s/p explant and replacemtn.  F/U in clnic   2.  Atrial fib.    3.  HTN  Adequate     LOS: 1 day   Dorris Carnes 09/02/2013, 7:38 AM

## 2013-09-02 NOTE — Discharge Summary (Signed)
Physician Discharge Summary  [name] ID: [name] MRN: [id number] DOB/AGE: 78-Apr-1931 78 y.o.  Admit date: 09/01/2013 Discharge date: 09/02/2013  Primary Discharge Diagnosis:  1.Symptomatic Bradycardia 2. Generator at [employer] 3.Atrial Fibrillation (no anticoagulation due to SDH)  Secondary Discharge Diagnosis: 1. Hypertension 2. Left Parotid Gland Cancer-treated with radiation  Significant Diagnostic Studies:  1. Implantation of new single chamber permanent pacemaker  2. Fluoroscopy  3. Light sedation  4. Left upper extremity venography  Device details:  [place] [id number] serial number [id number]  Right ventricular lead Guidant Dextrus [id number] serial number [id number]  Hospital Course:  [name] is a 80 year old [name] of Dr. Sallyanne Kuster with known history of atrial fibrillation, with single-chamber permanent pacemaker secondary to slow ventricular response. He also has a history of preserved systolic function with mild to moderate calcific aortic valve stenosis by echo in April 2014 [name] has a history of subdural hematoma therefore is not on anticoagulation therapy. He continues to pericardial for stroke prevention.    He was admitted for generator replacement with a diagnosis of end-of-life per previous pacemaker. Gabriel old guided 1194 pacemaker was removed and a new Chemical engineer Advantio was placed by Dr. Harrell Gave on 09/01/2013. [name] tolerated Gabriel procedure well without complications of bleeding hematoma or significant pain. Incision was closed with staples which will remain in for followup appointment for wound clinic evaluation. He had significant education by Gabriel pacemaker representative concerning pacemaker followup, and interrogation this, and wound check. A followup appointment today with Dr. Orene Desanctis on Monday, March 23 at 28 AM.    Written pacemaker instructions were also provided for [name] for review. Gabriel  [name]'s been seen and examined by Dr. Wyatt Haste on discharge and found to be stable. [name] is anxious to return home. He is given a prescription for pain control with no refills.     Discharge Exam: Blood pressure 139/65, pulse 64, temperature 97.8 F (36.6 C), temperature source Oral, resp. rate 18, height 5\' 10"  (1.778 m), weight 186 lb 4.6 oz (84.5 kg), SpO2 95.00%.    Labs:   Lab Results  Component Value Date   WBC 6.2 08/29/2013   HGB 12.9* 08/29/2013   HCT 37.9* 08/29/2013   MCV 89.8 08/29/2013   PLT 184 08/29/2013     Recent Labs Lab 08/29/13 1519  NA 140  K 4.2  CL 102  CO2 30  BUN 21  CREATININE 0.92  CALCIUM 9.7  PROT 6.8  BILITOT 0.9  ALKPHOS 111  ALT 10  AST 15  GLUCOSE 112*   Lab Results  Component Value Date   CKTOTAL 469* 10/03/2011   CKMB 3.0 10/03/2011   TROPONINI <0.30 10/03/2011    Lab Results  Component Value Date   CHOL 115 10/03/2011   Lab Results  Component Value Date   HDL 55 10/03/2011   Lab Results  Component Value Date   LDLCALC 47 10/03/2011   Lab Results  Component Value Date   TRIG 63 10/03/2011   Lab Results  Component Value Date   CHOLHDL 2.1 10/03/2011   No results found for this basename: LDLDIRECT      Radiology: Dg Chest 2 View  09/02/2013   CLINICAL DATA:  Status post pacemaker placement  EXAM: CHEST  2 VIEW  COMPARISON:  04/03/2012  FINDINGS: New pacing device is seen. Cardiac shadow is stable. Gabriel lungs are well aerated bilaterally. Some scarring is noted in Gabriel right lung base  residual from previous infiltrate. Gabriel right-sided chest wall port is been removed in Gabriel interval. No new focal abnormality is seen. No pneumothorax is noted.  IMPRESSION: No pneumothorax following pacer placement   Electronically Signed   By: Inez Catalina M.D.   On: 09/02/2013 07:58    EKG: Ventricular pacing  FOLLOW UP PLANS AND APPOINTMENTS     Discharge Orders   Future Appointments Provider Department Dept Phone   09/11/2013 11:00  AM Cecilie Kicks, NP [facility] [phone]   10/03/2013 3:15 PM Sanda Klein, MD [facility] [phone]   03/28/2014 1:00 PM [facility] [phone]   03/28/2014 1:30 PM [facility] [phone]   Future Orders Complete By Expires   Diet - low sodium heart healthy  As directed    Discharge wound Gabriel:  As directed    Comments:     Supplemental Discharge Instructions for  Pacemaker/Defibrillator Patients  Activity No heavy lifting or vigorous activity with your left/right arm for 6 to 8 weeks.  Do not raise your left/right arm above your head for one week.  Gradually raise your affected arm as drawn below.           __  NO DRIVING for     ; you may begin driving on     .  WOUND Gabriel Keep Gabriel wound area clean and dry.  Do not get this area wet for one week. No showers for one week; you may shower on     . Gabriel tape/steri-strips on your wound will fall off; do not pull them off.  No bandage is needed on Gabriel site.  DO  NOT apply any creams, oils, or ointments to Gabriel wound area. If you notice any drainage or discharge from Gabriel wound, any swelling or bruising at Gabriel site, or you develop a fever > 101? F after you are discharged home, call Gabriel office at once.  Special Instructions You are still able to use cellular telephones; use Gabriel ear opposite Gabriel side where you have your pacemaker/defibrillator.  Avoid carrying your cellular phone near your device. When traveling through airports, show security personnel your identification card to avoid being screened in Gabriel metal detectors.  Ask Gabriel security personnel to use Gabriel hand wand. Avoid arc welding equipment, MRI testing (magnetic resonance imaging), TENS units (transcutaneous nerve stimulators).  Call Gabriel office for questions about other devices. Avoid electrical appliances that are in poor condition or  are not properly grounded. Microwave ovens are safe to be near or to operate.  Additional information for defibrillator patients should your device go off: If your device goes off ONCE and you feel fine afterward, notify Gabriel device clinic nurses. If your device goes off ONCE and you do not feel well afterward, call 911. If your device goes off TWICE, call 911. If your device goes off THREE times in one day, call 911.  DO NOT DRIVE YOURSELF OR A FAMILY MEMBER WITH A DEFIBRILLATOR TO Gabriel HOSPITAL-CALL 911.   Increase activity slowly  As directed    Leave dressing on - Keep it clean, dry, and intact until clinic visit  As directed        Medication List         acetaminophen 325 MG tablet  Commonly known as:  TYLENOL  Take 1-2 tablets (325-650 mg total) by mouth every 4 (four) hours as needed for mild  pain.     atorvastatin 40 MG tablet  Commonly known as:  LIPITOR  Take 40 mg by mouth every morning.     clopidogrel 75 MG tablet  Commonly known as:  PLAVIX  Take 75 mg by mouth every morning.     digoxin 0.25 MG tablet  Commonly known as:  LANOXIN  Take 0.125 mg by mouth every morning.     finasteride 5 MG tablet  Commonly known as:  PROSCAR  Take 5 mg by mouth daily.     HYDROcodone-acetaminophen 5-325 MG per tablet  Commonly known as:  NORCO/VICODIN  Take 1-2 tablets by mouth every 4 (four) hours as needed for moderate pain.     metoprolol succinate 25 MG 24 hr tablet  Commonly known as:  TOPROL-XL  Take 12.5-25 mg by mouth daily. Alternate 1 tablet with 0.5 tablet daily     OVER Gabriel COUNTER MEDICATION  Take 1 tablet by mouth at bedtime. Pain/sleep. Aleve PM     ramipril 10 MG capsule  Commonly known as:  ALTACE  Take 10 mg by mouth every morning.       Follow-up Information   Follow up with Sanda Klein, MD. (Monday March 23 at 11:00 AM, Cecilie Kicks, NP)    Specialty:  Cardiology   Contact information:   [address] [place]  [address] [phone]        Time spent with [name] to include physician time: 35 mintues Signed: Jory Sims 09/02/2013, 8:17 AM Co-Sign MD

## 2013-09-11 ENCOUNTER — Encounter: Payer: Self-pay | Admitting: Cardiology

## 2013-09-11 ENCOUNTER — Ambulatory Visit (INDEPENDENT_AMBULATORY_CARE_PROVIDER_SITE_OTHER): Payer: Medicare Other | Admitting: Cardiology

## 2013-09-11 VITALS — BP 150/70 | HR 68 | Ht 69.0 in | Wt 190.0 lb

## 2013-09-11 DIAGNOSIS — Z950 Presence of cardiac pacemaker: Secondary | ICD-10-CM

## 2013-09-11 MED ORDER — HYDROCODONE-ACETAMINOPHEN 5-325 MG PO TABS: 1.0000 | ORAL_TABLET | ORAL | Status: DC | PRN

## 2013-09-11 NOTE — Patient Instructions (Addendum)
Still be careful with raising the lt arm.  Call if any bleeding or more swelling.  Follow up with Dr. Sallyanne Kuster as planned.

## 2013-09-12 NOTE — Assessment & Plan Note (Addendum)
Generator [employer] Advantio model [id number] serial number [id number]  Right ventricular lead Guidant Dextrus [id number] serial number [id number]  Explanted Guidant [id number], SN# [id number]  Old Right ventricular lead (insulation malfunction, capped) [employer] 430-02  09/01/13

## 2013-09-12 NOTE — Progress Notes (Signed)
09/12/2013   PCP: [name], MD   Chief Complaint  Patient presents with  . Follow-up    site check    Primary Cardiologist: Dr. Sallyanne Kuster  HPI: 80 year old WM with history of atrial fibrillation and a single-chamber permanent pacemaker secondary to slow ventricular response. He has no known problems with coronary disease and in 2001 had a normal myocardial perfusion study. He has preserved left ventricular systolic function but has mild to moderate calcific aortic valve stenosis (by echo April 2014 estimated valve area around 1.3 mean gradient 16 mm Hg) and moderate mitral insufficiency.  His pacemaker had reached elective replacement indicator. Although 80 years old, his ventricular lead is still working well. It would be preferable if his ventricular lead could allow auto capture, to give him the full benefit of a moderate device. On the other hand venous access issues are to be anticipated. He has had multiple lead revisions and even a lead extraction from the patient's description his left subclavian atrial lead was removed via the femoral approach). He has also had a right subclavian Port-A-Cath, subsequently removed.  Elective pacer gen change was arranged and RV lead.  Device detailsEmergency planning/management officer Advantio model [id number] serial number [id number]  Right ventricular lead Guidant Dextrus [id number] serial number [id number]  Explanted Guidant [id number], SN# [id number]  Old Right ventricular lead (insulation malfunction, capped) [employer] 430-02   He returns today for site check and staple removal.  He has been moving his left arm wherever he would like.  He was again reminded that this was a new lead and needed to settle.  He said he was not worried.     Allergies  Allergen Reactions  . Codeine     Blisters between fingers  . Docetaxel Rash    Current Outpatient Prescriptions  Medication Sig Dispense Refill  . acetaminophen (TYLENOL) 325 MG tablet Take 1-2  tablets (325-650 mg total) by mouth every 4 (four) hours as needed for mild pain.      Marland Kitchen atorvastatin (LIPITOR) 40 MG tablet Take 40 mg by mouth every morning.       . clopidogrel (PLAVIX) 75 MG tablet Take 75 mg by mouth every morning.      . digoxin (LANOXIN) 0.25 MG tablet Take 0.125 mg by mouth every morning.       . finasteride (PROSCAR) 5 MG tablet Take 5 mg by mouth daily.      Marland Kitchen HYDROcodone-acetaminophen (NORCO/VICODIN) 5-325 MG per tablet Take 1-2 tablets by mouth every 4 (four) hours as needed for moderate pain.  20 tablet  0  . metoprolol succinate (TOPROL-XL) 25 MG 24 hr tablet Take 12.5-25 mg by mouth daily. Alternate 1 tablet with 0.5 tablet daily      . OVER THE COUNTER MEDICATION Take 1 tablet by mouth at bedtime. Pain/sleep. Aleve PM      . ramipril (ALTACE) 10 MG capsule Take 10 mg by mouth every morning.        No current facility-administered medications for this visit.    Past Medical History  Diagnosis Date  . Atrial fibrillation 1990  . Dyslipidemia   . Prostate hypertrophy   . Arthritis     degenerative-knees  . History of radiation therapy 05/05/07-06/15/07    right parotid through subclavicular region/ mid jugularlymph node chain  . Allergy   . Hypertension     labile  . Heart murmur  2/6 systolic  . History of chemotherapy      Hx carboplatin/25fu  . Xerostomia     limited  . Stroke 2003    TIA  . Bladder cancer 2010  . Cancer 05/05/07-06/15/07    parotid gland/66.4 GY  . HOH (hard of hearing)   . Sleep apnea     Not diagnosied; pt denies 03/29/12  . Pacemaker     2003 vent paced , rate 69  . Chronic kidney disease     positive cytology  . Dry eyes   . Diabetes mellitus without complication     meds d/c  1 year ago  . GERD (gastroesophageal reflux disease)   . History of radiation therapy 05/05/07-06/15/07    Right parotid/hemineck,supraclavicular    Past Surgical History  Procedure Laterality Date  . Pacemaker insertion  1990    last  changed 2003  . Cystoscopy  01/2011    neg  . Back surgery    . Parotidectomy      right  . Craniotomy  12/14/2011    Procedure: CRANIOTOMY HEMATOMA EVACUATION SUBDURAL;  Surgeon: Ophelia Charter, MD;  Location: [facility];  Service: Neurosurgery;  Laterality: Left;  LEFT Craniotomy for subdural   . Neurofibroma excision  1980  . Cystoscopy with biopsy  04/01/2012    Procedure: CYSTOSCOPY WITH BIOPSY;  Surgeon: Claybon Jabs, MD;  Location[facility];  Service: Urology;  Laterality: N/A;  BLADDER BIOPSIES and bladder washings  . Cystoscopy w/ retrogrades  04/01/2012    Procedure: CYSTOSCOPY WITH RETROGRADE PYELOGRAM;  Surgeon: Claybon Jabs, MD;  Location: [facility];  Service: Urology;  Laterality: Bilateral;  . Ureteroscopy  04/01/2012    Procedure: URETEROSCOPY;  Surgeon: Claybon Jabs, MD;  Location: [facility];  Service: Urology;  Laterality: Right;  . Cystoscopy/retrograde/ureteroscopy  05/02/2012    Procedure: CYSTOSCOPY/RETROGRADE/URETEROSCOPY;  Surgeon: Claybon Jabs, MD;  Location: [facility];  Service: Urology;  Laterality: Bilateral;  CYSTOSCOPY BILATERAL RETROGRADE PYLOGRAM BILATERAL URETEROSCOPY AND BIOPSY    . Cystoscopy with biopsy  05/02/2012    Procedure: CYSTOSCOPY WITH BIOPSY;  Surgeon: Claybon Jabs, MD;  Location: [facility];  Service: Urology;  Laterality: N/A;  . Cystoscopy with stent placement  05/02/2012    Procedure: CYSTOSCOPY WITH STENT PLACEMENT;  Surgeon: Claybon Jabs, MD;  Location: [facility];  Service: Urology;  Laterality: Bilateral;  . Pacemaker generator change  05/23/2002    [place], [place], serial [id number]  . Lower extremity arterial doppler  09/28/2011    No evidence of arterial insufficiency.   . Cardiovascular stress test  09/05/2009    Pharmalogical stress test w/o chest pain or EKG changes for ischemia  . Cardiac  catheterization  09/20/2012    EF 50-55%, moderately calcified aortic valve leaflets, mild-moderate aortic valve stenosis, LA-appendage moderately dilated, moderate regurg of the mitral valve, RA moderately dilated    QVZ:DGLOVFI:EP colds or fevers, no weight changes Skin:no rashes or ulcers CV:see HPI PUL:see HPI   PHYSICAL EXAM BP 150/70  Pulse 68  Ht 5\' 9"  (1.753 m)  Wt 190 lb (86.183 kg)  BMI 28.05 kg/m2 General:Pleasant affect, NAD Skin:Warm and dry, brisk capillary refill, pacer site with some swelling but site without hematoma, no redenss Heart:S1S2 RRR without murmur, gallup, rub or click Lungs:clear without rales, rhonchi, or wheezes Staples removed without complications.  steristrips were placed.  Pt will follow up  with Dr. Sallyanne Kuster as previously arranged.  ASSESSMENT AND PLAN Pacemaker Generator [employer] Advantio model [id number] serial number [id number]  Right ventricular lead Guidant Dextrus 7622 serial number [id number]  Explanted Guidant [id number], SN# [id number]  Old Right ventricular lead (insulation malfunction, capped) [employer] 430-02  09/01/13

## 2013-10-03 ENCOUNTER — Encounter: Payer: Self-pay | Admitting: Cardiovascular Disease

## 2013-10-03 ENCOUNTER — Ambulatory Visit (INDEPENDENT_AMBULATORY_CARE_PROVIDER_SITE_OTHER): Payer: Medicare Other | Admitting: Cardiovascular Disease

## 2013-10-03 VITALS — BP 142/72 | HR 63 | Resp 16 | Ht 69.5 in | Wt 188.1 lb

## 2013-10-03 DIAGNOSIS — I4891 Unspecified atrial fibrillation: Secondary | ICD-10-CM

## 2013-10-03 DIAGNOSIS — Z950 Presence of cardiac pacemaker: Secondary | ICD-10-CM

## 2013-10-03 LAB — MDC_IDC_ENUM_SESS_TYPE_INCLINIC
Lead Channel Impedance Value: 773 Ohm
Lead Channel Pacing Threshold Amplitude: 0.7 V
Lead Channel Pacing Threshold Pulse Width: 0.4 ms
Lead Channel Setting Pacing Amplitude: 1.2 V
Lead Channel Setting Pacing Pulse Width: 0.4 ms
Lead Channel Setting Sensing Sensitivity: 2 mV
MDC IDC MSMT LEADCHNL RV SENSING INTR AMPL: 25 mV
MDC IDC PG SERIAL: 114623
MDC IDC STAT BRADY RV PERCENT PACED: 99 %

## 2013-10-03 NOTE — Patient Instructions (Signed)
Remote monitoring is used to monitor your Pacemaker of ICD from home. This monitoring reduces the number of office visits required to check your device to one time per year. It allows [place] to keep an eye on the functioning of your device to ensure it is working properly. You are scheduled for a device check from home on [place]. You may send your transmission at any time that day. If you have a wireless device, the transmission will be sent automatically. After your physician reviews your transmission, you will receive a postcard with your next transmission date.  Your physician recommends that you schedule a follow-up appointment in: [place] with Dr. Sallyanne Kuster

## 2013-10-04 ENCOUNTER — Encounter: Payer: Self-pay | Admitting: Cardiovascular Disease

## 2013-10-04 NOTE — Assessment & Plan Note (Signed)
Normal generator and lead function. Excellent lead parameters. Estimated device longevity 10.5 years. Virtually 100% ventricular paced. Underlying rhythm is atrial fibrillation with slow ventricular response at around 40 beats per minute.

## 2013-10-04 NOTE — Progress Notes (Signed)
Patient ID: [name], male   DOB: 07-24-1929, 80 y.o.   MRN: [id number]      Reason for office visit Permanent atrial fibrillation, recent pacemaker revision  [name] returns in followup roughly one month following implantation of a new pacemaker system. His ventricular lead had been in place since 1990 and was showing some signs of dysfunction. He received a new generator in the right ventricular lead on March 15. The surgical site is well-healed. A comprehensive check of his pacemaker in the office today shows normal function. He has no complaints today.   He has no known problems with coronary disease and in 2001 had a normal myocardial perfusion study. He has preserved left ventricular systolic function but has mild to moderate calcific aortic valve stenosis (by echo April 2014 estimated valve area around 1.3 mean gradient 16 mm Hg) and moderate mitral insufficiency.  He has a history of subdural hematoma and is therefore not taking warfarin. He is on clopidogrel for stroke prevention. He has treated systemic hypertension and hyperlipidemia. He bears a diagnosis of diabetes mellitus but no longer takes any medications for this. 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    Allergies  Allergen Reactions  . Codeine     Blisters between fingers  . Docetaxel Rash    Current Outpatient Prescriptions  Medication Sig Dispense Refill  . acetaminophen (TYLENOL) 325 MG tablet Take 1-2 tablets (325-650 mg total) by mouth every 4 (four) hours as needed for mild pain.      Marland Kitchen atorvastatin (LIPITOR) 40 MG tablet Take 40 mg by mouth every morning.       . clopidogrel (PLAVIX) 75 MG tablet Take 75 mg by mouth every morning.      . digoxin (LANOXIN) 0.25 MG tablet Take 0.125 mg by mouth every morning.       . finasteride  (PROSCAR) 5 MG tablet Take 5 mg by mouth daily.      Marland Kitchen HYDROcodone-acetaminophen (NORCO/VICODIN) 5-325 MG per tablet Take 1-2 tablets by mouth every 4 (four) hours as needed for moderate pain.  20 tablet  0  . metoprolol succinate (TOPROL-XL) 25 MG 24 hr tablet Take 12.5-25 mg by mouth daily. Alternate 1 tablet with 0.5 tablet daily      . OVER THE COUNTER MEDICATION Take 1 tablet by mouth at bedtime. Pain/sleep. Aleve PM      . ramipril (ALTACE) 10 MG capsule Take 10 mg by mouth every morning.        No current facility-administered medications for this visit.    Past Medical History  Diagnosis Date  . Atrial fibrillation 1990  . Dyslipidemia   . Prostate hypertrophy   . Arthritis     degenerative-knees  . History of radiation therapy 05/05/07-06/15/07    right parotid through subclavicular region/ mid jugularlymph node chain  . Allergy   . Hypertension     labile  . Heart murmur     2/6 systolic  . History of chemotherapy      Hx carboplatin/20fu  . Xerostomia     limited  . Stroke 2003    TIA  . Bladder cancer 2010  . Cancer 05/05/07-06/15/07    parotid gland/66.4 GY  . HOH (hard of hearing)   . Sleep apnea     Not diagnosied; pt denies 03/29/12  .  Pacemaker     2003 vent paced , rate 69  . Chronic kidney disease     positive cytology  . Dry eyes   . Diabetes mellitus without complication     meds d/c  1 year ago  . GERD (gastroesophageal reflux disease)   . History of radiation therapy 05/05/07-06/15/07    Right parotid/hemineck,supraclavicular    Past Surgical History  Procedure Laterality Date  . Pacemaker insertion  1990    last changed 2003  . Cystoscopy  01/2011    neg  . Back surgery    . Parotidectomy      right  . Craniotomy  12/14/2011    Procedure: CRANIOTOMY HEMATOMA EVACUATION SUBDURAL;  Surgeon: Ophelia Charter, MD;  Location: [facility];  Service: Neurosurgery;  Laterality: Left;  LEFT Craniotomy for subdural   . Neurofibroma excision  1980   . Cystoscopy with biopsy  04/01/2012    Procedure: CYSTOSCOPY WITH BIOPSY;  Surgeon: Claybon Jabs, MD;  Location: [facility];  Service: Urology;  Laterality: N/A;  BLADDER BIOPSIES and bladder washings  . Cystoscopy w/ retrogrades  04/01/2012    Procedure: CYSTOSCOPY WITH RETROGRADE PYELOGRAM;  Surgeon: Claybon Jabs, MD;  Location: [facility];  Service: Urology;  Laterality: Bilateral;  . Ureteroscopy  04/01/2012    Procedure: URETEROSCOPY;  Surgeon: Claybon Jabs, MD;  Location: [facility];  Service: Urology;  Laterality: Right;  . Cystoscopy/retrograde/ureteroscopy  05/02/2012    Procedure: CYSTOSCOPY/RETROGRADE/URETEROSCOPY;  Surgeon: Claybon Jabs, MD;  Location: [facility];  Service: Urology;  Laterality: Bilateral;  CYSTOSCOPY BILATERAL RETROGRADE PYLOGRAM BILATERAL URETEROSCOPY AND BIOPSY    . Cystoscopy with biopsy  05/02/2012    Procedure: CYSTOSCOPY WITH BIOPSY;  Surgeon: Claybon Jabs, MD;  Location: [facility];  Service: Urology;  Laterality: N/A;  . Cystoscopy with stent placement  05/02/2012    Procedure: CYSTOSCOPY WITH STENT PLACEMENT;  Surgeon: Claybon Jabs, MD;  Location: [facility];  Service: Urology;  Laterality: Bilateral;  . Pacemaker generator change  05/23/2002    [place], [place], serial [id number]  . Lower extremity arterial doppler  09/28/2011    No evidence of arterial insufficiency.   . Cardiovascular stress test  09/05/2009    Pharmalogical stress test w/o chest pain or EKG changes for ischemia  . Cardiac catheterization  09/20/2012    EF 50-55%, moderately calcified aortic valve leaflets, mild-moderate aortic valve stenosis, LA-appendage moderately dilated, moderate regurg of the mitral valve, RA moderately dilated    Family History  Problem Relation Age of Onset  . Heart disease Mother   . Cancer Father     Lung cancer  . Emphysema  Brother   . Coronary artery disease Son     History   Social History  . Marital Status: Widowed    Spouse Name: N/A    Number of Children: N/A  . Years of Education: N/A   Occupational History  . Not on file.   Social History Main Topics  . Smoking status: Never Smoker   . Smokeless tobacco: Never Used  . Alcohol Use: 10.5 oz/week    21 drink(s) per week     Comment: 3 /day( bourbon)  . Drug Use: No  . Sexual Activity: No   Other Topics Concern  . Not on file   Social History Narrative  . No narrative on file    Review of systems:  The patient specifically denies any chest pain at rest or with exertion, dyspnea at rest or with exertion, orthopnea, paroxysmal nocturnal dyspnea, syncope, palpitations, focal neurological deficits, intermittent claudication, lower extremity edema, unexplained weight gain, cough, hemoptysis or wheezing.  The patient also denies abdominal pain, nausea, vomiting, dysphagia, diarrhea, constipation, polyuria, polydipsia, dysuria, hematuria, frequency, urgency, abnormal bleeding or bruising, fever, chills, unexpected weight changes, mood swings, change in skin or hair texture, change in voice quality, auditory or visual problems, allergic reactions or rashes, new musculoskeletal complaints other than usual "aches and pains".   PHYSICAL EXAM BP 142/72  Pulse 63  Resp 16  Ht 5' 9.5" (1.765 m)  Wt 188 lb 1.6 oz (85.322 kg)  BMI 27.39 kg/m2 General: Alert, oriented x3, no distress  Head: no evidence of trauma, PERRL, EOMI, no exophtalmos or lid lag, no myxedema, no xanthelasma; normal ears, nose and oropharynx  Neck: normal jugular venous pulsations and no hepatojugular reflux; brisk carotid pulses without delay and no carotid bruits; scar of previous Parotid surgery and radiation therapy  Chest: clear to auscultation, no signs of consolidation by percussion or palpation, normal fremitus, symmetrical and full respiratory excursions. Scar of right  sided subclavian Port-A-Cath, healthy-appearing left subclavian pacemaker site  Cardiovascular: normal position and quality of the apical impulse, regular rhythm, normal first and paradoxically split second heart sounds, no rubs or gallops. There is a grade 2-3/6 early peaking systolic ejection murmur in the aortic focus radiating toward the carotids and a fainter holosystolic murmur at the apex  Abdomen: no tenderness or distention, no masses by palpation, no abnormal pulsatility or arterial bruits, normal bowel sounds, no hepatosplenomegaly  Extremities: no clubbing, cyanosis or edema; 2+ radial, ulnar and brachial pulses bilaterally; 2+ right femoral, posterior tibial and dorsalis pedis pulses; 2+ left femoral, posterior tibial and dorsalis pedis pulses; no subclavian or femoral bruits  Neurological: grossly nonfocal    EKG: Atrial fibrillation ventricular pacing   Lipid Panel     Component Value Date/Time   CHOL 115 10/03/2011 0600   TRIG 63 10/03/2011 0600   HDL 55 10/03/2011 0600   CHOLHDL 2.1 10/03/2011 0600   VLDL 13 10/03/2011 0600   LDLCALC 47 10/03/2011 0600    BMET    Component Value Date/Time   NA 140 08/29/2013 1519   NA 138 03/28/2013 1302   K 4.2 08/29/2013 1519   K 4.7 03/28/2013 1302   CL 102 08/29/2013 1519   CO2 30 08/29/2013 1519   CO2 24 03/28/2013 1302   GLUCOSE 112* 08/29/2013 1519   GLUCOSE 124 03/28/2013 1302   BUN 21 08/29/2013 1519   BUN 18.0 03/28/2013 1302   CREATININE 0.92 08/29/2013 1519   CREATININE 1.3 03/28/2013 1302   CREATININE 1.21 02/26/2013 1650   CALCIUM 9.7 08/29/2013 1519   CALCIUM 9.9 03/28/2013 1302   GFRNONAA 54* 02/26/2013 1650   GFRAA 63* 02/26/2013 1650     ASSESSMENT AND PLAN  Permanent atrial fibrillation  Not on warfarin secondary to traumatic subdural hematoma. Unfortunately he also has a history of transient ischemic attack which puts him at high risk for future cardioembolic events.  Pacemaker  His new pacemaker is amenable to remote  monitoring. We will alternate home remote transmissions with device checks in the office every 3 months. Aortic stenosis  Doppler parameters suggest that this is mild to moderate, but 2D imaging shows heavy calcification which may signify a tendency to rapid progression. Monitor for symptoms of progressive aortic stenosis (exertional angina, exertional  dyspnea, exertional syncope).   Belia Febo  Sanda Klein, MD, [facility] [phone] office [phone] pager

## 2014-01-03 ENCOUNTER — Encounter: Payer: Self-pay | Admitting: Cardiovascular Disease

## 2014-01-03 ENCOUNTER — Ambulatory Visit (INDEPENDENT_AMBULATORY_CARE_PROVIDER_SITE_OTHER): Payer: Medicare Other | Admitting: *Deleted

## 2014-01-03 DIAGNOSIS — I4891 Unspecified atrial fibrillation: Secondary | ICD-10-CM

## 2014-01-03 DIAGNOSIS — I4819 Other persistent atrial fibrillation: Secondary | ICD-10-CM

## 2014-01-03 LAB — MDC_IDC_ENUM_SESS_TYPE_REMOTE
Battery Remaining Longevity: 10.5
Implantable Pulse Generator Model: 1194
Implantable Pulse Generator Serial Number: 114623
Lead Channel Impedance Value: 766 Ohm
Lead Channel Pacing Threshold Amplitude: 0.8 V
Lead Channel Pacing Threshold Pulse Width: 0.4 ms
Lead Channel Setting Pacing Amplitude: 1.2 V
Lead Channel Setting Pacing Pulse Width: 0.4 ms
MDC IDC SET LEADCHNL RV SENSING SENSITIVITY: 2 mV
MDC IDC STAT BRADY RV PERCENT PACED: 99 %

## 2014-01-03 NOTE — Progress Notes (Signed)
Remote pacemaker transmission.   

## 2014-01-04 ENCOUNTER — Other Ambulatory Visit: Payer: Self-pay | Admitting: Dermatology

## 2014-01-16 ENCOUNTER — Encounter: Payer: Self-pay | Admitting: Cardiology

## 2014-01-17 ENCOUNTER — Encounter: Payer: Self-pay | Admitting: Cardiovascular Disease

## 2014-01-25 ENCOUNTER — Telehealth: Payer: Self-pay | Admitting: Cardiovascular Disease

## 2014-01-25 NOTE — Telephone Encounter (Signed)
Closed encounter °

## 2014-03-14 ENCOUNTER — Other Ambulatory Visit: Payer: Self-pay | Admitting: Dermatology

## 2014-03-28 ENCOUNTER — Telehealth: Payer: Self-pay | Admitting: Hematology

## 2014-03-28 ENCOUNTER — Ambulatory Visit (HOSPITAL_BASED_OUTPATIENT_CLINIC_OR_DEPARTMENT_OTHER): Payer: Medicare Other | Admitting: Hematology

## 2014-03-28 ENCOUNTER — Other Ambulatory Visit (HOSPITAL_BASED_OUTPATIENT_CLINIC_OR_DEPARTMENT_OTHER): Payer: Medicare Other

## 2014-03-28 VITALS — BP 136/58 | HR 59 | Temp 98.2°F | Resp 18 | Ht 69.0 in | Wt 195.8 lb

## 2014-03-28 DIAGNOSIS — F10231 Alcohol dependence with withdrawal delirium: Secondary | ICD-10-CM

## 2014-03-28 DIAGNOSIS — C07 Malignant neoplasm of parotid gland: Secondary | ICD-10-CM

## 2014-03-28 DIAGNOSIS — F10931 Alcohol use, unspecified with withdrawal delirium: Secondary | ICD-10-CM

## 2014-03-28 DIAGNOSIS — D0910 Carcinoma in situ of unspecified urinary organ: Secondary | ICD-10-CM

## 2014-03-28 DIAGNOSIS — C679 Malignant neoplasm of bladder, unspecified: Secondary | ICD-10-CM

## 2014-03-28 DIAGNOSIS — Z9181 History of falling: Secondary | ICD-10-CM

## 2014-03-28 DIAGNOSIS — Z23 Encounter for immunization: Secondary | ICD-10-CM

## 2014-03-28 LAB — CBC WITH DIFFERENTIAL/PLATELET
BASO%: 0.5 % (ref 0.0–2.0)
Basophils Absolute: 0.1 10*3/uL (ref 0.0–0.1)
EOS%: 1.3 % (ref 0.0–7.0)
Eosinophils Absolute: 0.1 10*3/uL (ref 0.0–0.5)
HCT: 43.5 % (ref 38.4–49.9)
HGB: 14.5 g/dL (ref 13.0–17.1)
LYMPH%: 17.5 % (ref 14.0–49.0)
MCH: 31.9 pg (ref 27.2–33.4)
MCHC: 33.4 g/dL (ref 32.0–36.0)
MCV: 95.4 fL (ref 79.3–98.0)
MONO#: 0.4 10*3/uL (ref 0.1–0.9)
MONO%: 4.5 % (ref 0.0–14.0)
NEUT#: 7.5 10*3/uL — ABNORMAL HIGH (ref 1.5–6.5)
NEUT%: 76.2 % — ABNORMAL HIGH (ref 39.0–75.0)
Platelets: 200 10*3/uL (ref 140–400)
RBC: 4.56 10*6/uL (ref 4.20–5.82)
RDW: 14.2 % (ref 11.0–14.6)
WBC: 9.8 10*3/uL (ref 4.0–10.3)
lymph#: 1.7 10*3/uL (ref 0.9–3.3)

## 2014-03-28 LAB — COMPREHENSIVE METABOLIC PANEL (CC13)
ALT: 11 U/L (ref 0–55)
AST: 16 U/L (ref 5–34)
Albumin: 3.9 g/dL (ref 3.5–5.0)
Alkaline Phosphatase: 121 U/L (ref 40–150)
Anion Gap: 8 mEq/L (ref 3–11)
BUN: 19.1 mg/dL (ref 7.0–26.0)
CALCIUM: 10.1 mg/dL (ref 8.4–10.4)
CO2: 30 mEq/L — ABNORMAL HIGH (ref 22–29)
Chloride: 101 mEq/L (ref 98–109)
Creatinine: 1.3 mg/dL (ref 0.7–1.3)
Glucose: 186 mg/dl — ABNORMAL HIGH (ref 70–140)
Potassium: 4.2 mEq/L (ref 3.5–5.1)
Sodium: 139 mEq/L (ref 136–145)
TOTAL PROTEIN: 7.7 g/dL (ref 6.4–8.3)
Total Bilirubin: 1.72 mg/dL — ABNORMAL HIGH (ref 0.20–1.20)

## 2014-03-28 MED ORDER — INFLUENZA VAC SPLIT QUAD 0.5 ML IM SUSY
0.5000 mL | PREFILLED_SYRINGE | Freq: Once | INTRAMUSCULAR | Status: AC
  Administered 2014-03-28: 0.5 mL via INTRAMUSCULAR
  Filled 2014-03-28: qty 0.5

## 2014-03-28 NOTE — Telephone Encounter (Signed)
gv adn printed appt sched and avs for pt for OCT 2016 °

## 2014-03-28 NOTE — Patient Instructions (Signed)
Influenza Vaccine (Flu Vaccine, Inactivated or Recombinant) 2014-2015: What You Need to Know 1. Why get vaccinated? Influenza ("flu") is a contagious disease that spreads around the [place] every winter, usually between October and May. Flu is caused by influenza viruses, and is spread mainly by coughing, sneezing, and close contact. Anyone can get flu, but the risk of getting flu is highest among children. Symptoms come on suddenly and may last several days. They can include:  fever/chills  sore throat  muscle aches  fatigue  cough  headache  runny or stuffy nose Flu can make some people much sicker than others. These people include young children, people 65 and older, pregnant women, and people with certain health conditions-such as heart, lung or kidney disease, nervous system disorders, or a weakened immune system. Flu vaccination is especially important for these people, and anyone in close contact with them. Flu can also lead to pneumonia, and make existing medical conditions worse. It can cause diarrhea and seizures in children. Each year thousands of people in the [place] die from flu, and many more are hospitalized. Flu vaccine is the best protection against flu and its complications. Flu vaccine also helps prevent spreading flu from person to person. 2. Inactivated and recombinant flu vaccines You are getting an injectable flu vaccine, which is either an "inactivated" or "recombinant" vaccine. These vaccines do not contain any live influenza virus. They are given by injection with a needle, and often called the "flu shot."  A different live, attenuated (weakened) influenza vaccine is sprayed into the nostrils. This vaccine is described in a separate Vaccine Information Statement. Flu vaccination is recommended every year. Some children 6 months through 8 years of age might need two doses during one year. Flu viruses are always changing. Each year's flu vaccine is made  to protect against 3 or 4 viruses that are likely to cause disease that year. Flu vaccine cannot prevent all cases of flu, but it is the best defense against the disease.  It takes about 2 weeks for protection to develop after the vaccination, and protection lasts several months to a year. Some illnesses that are not caused by influenza virus are often mistaken for flu. Flu vaccine will not prevent these illnesses. It can only prevent influenza. Some inactivated flu vaccine contains a very small amount of a mercury-based preservative called thimerosal. Studies have shown that thimerosal in vaccines is not harmful, but flu vaccines that do not contain a preservative are available. 3. Some people should not get this vaccine Tell the person who gives you the vaccine:  If you have any severe, life-threatening allergies. If you ever had a life-threatening allergic reaction after a dose of flu vaccine, or have a severe allergy to any part of this vaccine, including (for example) an allergy to gelatin, antibiotics, or eggs, you may be advised not to get vaccinated. Most, but not all, types of flu vaccine contain a small amount of egg protein.  If you ever had Guillain-Barr Syndrome (a severe paralyzing illness, also called GBS). Some people with a history of GBS should not get this vaccine. This should be discussed with your doctor.  If you are not feeling well. It is usually okay to get flu vaccine when you have a mild illness, but you might be advised to wait until you feel better. You should come back when you are better. 4. Risks of a vaccine reaction With a vaccine, like any medicine, there is a chance of side   effects. These are usually mild and go away on their own. Problems that could happen after any vaccine:  Brief fainting spells can happen after any medical procedure, including vaccination. Sitting or lying down for about 15 minutes can help prevent fainting, and injuries caused by a fall. Tell  your doctor if you feel dizzy, or have vision changes or ringing in the ears.  Severe shoulder pain and reduced range of motion in the arm where a shot was given can happen, very rarely, after a vaccination.  Severe allergic reactions from a vaccine are very rare, estimated at less than 1 in a million doses. If one were to occur, it would usually be within a few minutes to a few hours after the vaccination. Mild problems following inactivated flu vaccine:  soreness, redness, or swelling where the shot was given  hoarseness  sore, red or itchy eyes  cough  fever  aches  headache  itching  fatigue If these problems occur, they usually begin soon after the shot and last 1 or 2 days. Moderate problems following inactivated flu vaccine:  Young children who get inactivated flu vaccine and pneumococcal vaccine (PCV13) at the same time may be at increased risk for seizures caused by fever. Ask your doctor for more information. Tell your doctor if a child who is getting flu vaccine has ever had a seizure. Inactivated flu vaccine does not contain live flu virus, so you cannot get the flu from this vaccine. As with any medicine, there is a very remote chance of a vaccine causing a serious injury or death. The safety of vaccines is always being monitored. For more information, visit: www.cdc.gov/vaccinesafety/ 5. What if there is a serious reaction? What should I look for?  Look for anything that concerns you, such as signs of a severe allergic reaction, very high fever, or behavior changes. Signs of a severe allergic reaction can include hives, swelling of the face and throat, difficulty breathing, a fast heartbeat, dizziness, and weakness. These would start a few minutes to a few hours after the vaccination. What should I do?  If you think it is a severe allergic reaction or other emergency that can't wait, call 9-1-1 and get the person to the nearest hospital. Otherwise, call your  doctor.  Afterward, the reaction should be reported to the Vaccine Adverse Event Reporting System (VAERS). Your doctor should file this report, or you can do it yourself through the VAERS web site at www.vaers.hhs.gov, or by calling 1-800-822-7967. VAERS does not give medical advice. 6. The National Vaccine Injury Compensation Program The National Vaccine Injury Compensation Program (VICP) is a federal program that was created to compensate people who may have been injured by certain vaccines. Persons who believe they may have been injured by a vaccine can learn about the program and about filing a claim by calling 1-800-338-2382 or visiting the VICP website at www.hrsa.gov/vaccinecompensation. There is a time limit to file a claim for compensation. 7. How can I learn more?  Ask your health care provider.  Call your local or state health department.  Contact the Centers for Disease Control and Prevention (CDC):  Call 1-800-232-4636 (1-800-CDC-INFO) or  Visit CDC's website at www.cdc.gov/flu CDC Vaccine Information Statement (Interim) Inactivated Influenza Vaccine (02/07/2013) Document Released: 04/02/2006 Document Revised: 10/23/2013 Document Reviewed: 05/26/2013 [employer] Patient Information 2015 [employer]. This information is not intended to replace advice given to you by your health care provider. Make sure you discuss any questions you have with your health   care provider.  

## 2014-03-29 ENCOUNTER — Other Ambulatory Visit: Payer: Self-pay | Admitting: Dermatology

## 2014-04-01 ENCOUNTER — Encounter: Payer: Self-pay | Admitting: Hematology

## 2014-04-01 NOTE — Progress Notes (Signed)
[facility] ONCOLOGY OFFICE PROGRESS NOTE DATE OF VISIT: 03/28/2014  Gabriel Pao, MD [place] [address]  DIAGNOSIS: Cancer of parotid gland - Plan: CBC with Differential, Comprehensive metabolic panel (Cmet) - [facility], Influenza vac split quadrivalent PF (FLUARIX) injection 0.5 mL   CURRENT THERAPY:Observation and Surveillance. Last seen here on 03/28/2013  INTERVAL HISTORY:  [name] 80 y.o. 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  Patient also has a history of bladder cancer (November 2009) treated with intravesical BCG by Dr. Kathie Rhodes in the spring 2010.  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 He was last seen by Dr. Juliann Mule on 03/28/13. He denies any recent falls he reports compliance with Plavix. Given his recent urothelial carcinoma in situ history, he's had evaluation by a urologist and had a cystoscopy last month. He also had 3-4 basal cell skin cancers removed from right leg and back. He also has a lesion in left lower eye lid not healing and can be another cancer which will be seen by a dermatologist tomorrow. He had lot of exposure to sun and beach when young.his physical exan and lymph node exam was normal today.  MEDICAL HISTORY: Past Medical History  Diagnosis Date  . Atrial fibrillation 1990  . Dyslipidemia   . Prostate hypertrophy   . Arthritis     degenerative-knees  . History of radiation therapy 05/05/07-06/15/07    right parotid through subclavicular region/ mid jugularlymph node chain  . Allergy   . Hypertension     labile  . Heart murmur     2/6 systolic  . History of chemotherapy      Hx carboplatin/9fu  . Xerostomia     limited  . Stroke 2003    TIA  . Bladder cancer 2010  . Cancer 05/05/07-06/15/07    parotid gland/66.4 GY  . HOH (hard of hearing)   . Sleep apnea      Not diagnosied; pt denies 03/29/12  . Pacemaker     2003 vent paced , rate 69  . Chronic kidney disease     positive cytology  . Dry eyes   . Diabetes mellitus without complication     meds d/c  1 year ago  . GERD (gastroesophageal reflux disease)   . History of radiation therapy 05/05/07-06/15/07    Right parotid/hemineck,supraclavicular    INTERIM HISTORY: has Primary mucoepidermoid carcinoma of parotid gland; Bladder cancer; Elevated PSA; Pacemaker; Diabetes mellitus; Hypertension; Dyslipidemia; Cancer; Subdural hematoma; ETOH abuse; Permanent atrial fibrillation; UTI (lower urinary tract infection); TIA (transient ischemic attack); Acute pulmonary edema; Alcohol withdrawal delirium; Abnormal urine cytology; Thrombocytosis; and Aortic stenosis on his problem list.    ALLERGIES:  is allergic to codeine and docetaxel.  MEDICATIONS: has a current medication list which includes the following prescription(s): acetaminophen, atorvastatin, clopidogrel, digoxin, finasteride, hydrocodone-acetaminophen, metoprolol succinate, OVER THE COUNTER MEDICATION, and ramipril.  SURGICAL HISTORY:  Past Surgical History  Procedure Laterality Date  . Pacemaker insertion  1990    last changed 2003  . Cystoscopy  01/2011    neg  . Back surgery    . Parotidectomy      right  . Craniotomy  12/14/2011    Procedure: CRANIOTOMY HEMATOMA EVACUATION SUBDURAL;  Surgeon: Ophelia Charter, MD;  Location: [facility];  Service: Neurosurgery;  Laterality: Left;  LEFT Craniotomy for subdural   .  Neurofibroma excision  1980  . Cystoscopy with biopsy  04/01/2012    Procedure: CYSTOSCOPY WITH BIOPSY;  Surgeon: Claybon Jabs, MD;  Location: [facility];  Service: Urology;  Laterality: N/A;  BLADDER BIOPSIES and bladder washings  . Cystoscopy w/ retrogrades  04/01/2012    Procedure: CYSTOSCOPY WITH RETROGRADE PYELOGRAM;  Surgeon: Claybon Jabs, MD;  Location: [facility];  Service:  Urology;  Laterality: Bilateral;  . Ureteroscopy  04/01/2012    Procedure: URETEROSCOPY;  Surgeon: Claybon Jabs, MD;  Location: [facility];  Service: Urology;  Laterality: Right;  . Cystoscopy/retrograde/ureteroscopy  05/02/2012    Procedure: CYSTOSCOPY/RETROGRADE/URETEROSCOPY;  Surgeon: Claybon Jabs, MD;  Location: [facility];  Service: Urology;  Laterality: Bilateral;  CYSTOSCOPY BILATERAL RETROGRADE PYLOGRAM BILATERAL URETEROSCOPY AND BIOPSY    . Cystoscopy with biopsy  05/02/2012    Procedure: CYSTOSCOPY WITH BIOPSY;  Surgeon: Claybon Jabs, MD;  Location: [facility];  Service: Urology;  Laterality: N/A;  . Cystoscopy with stent placement  05/02/2012    Procedure: CYSTOSCOPY WITH STENT PLACEMENT;  Surgeon: Claybon Jabs, MD;  Location: [facility];  Service: Urology;  Laterality: Bilateral;  . Pacemaker generator change  05/23/2002    [place], [place], serial [phone]  . Lower extremity arterial doppler  09/28/2011    No evidence of arterial insufficiency.   . Cardiovascular stress test  09/05/2009    Pharmalogical stress test w/o chest pain or EKG changes for ischemia  . Cardiac catheterization  09/20/2012    EF 50-55%, moderately calcified aortic valve leaflets, mild-moderate aortic valve stenosis, LA-appendage moderately dilated, moderate regurg of the mitral valve, RA moderately dilated   PROBLEM LIST:  1. Primary mucoepidermoid carcinoma of the parotid gland, high-grade T2 Nx status post right parotidectomy with nerve integrity monitoring on 01/12/2007 by Dr. Jerrell Belfast. Margin was positive. There was perineural invasion. The patient received initial chemotherapy. He initially received carboplatin, Taxotere and Neulasta on 02/18/2007. He developed a rash which we felt was due to Taxotere. Thereafter he was treated with carboplatin, 5-FU  and Neulasta 2 cycles in mid September and in mid October.  Thereafter he received weekly carboplatin 6 treatments in conjunction with radiation treatments to the right parotid gland area from 04/28/2007 through 06/08/2007. The patient has remained in complete remission since that time. As stated, we have maintained his Port-A-Cath.  2. Invasive high-grade urothelial bladder cancer diagnosed November 2009 treated with intravesical BCG in the spring of 2010, subsequently diagnosed with carcinoma in situ on 12/12/2010 by Dr. Kathie Rhodes treated with 6 weekly treatments of intravesical BCG, with most recent cystoscopy on 02/27/2011.  3. Elevated PSA.  4. Transvenous pacemaker in the left chest.  5. Diabetes mellitus.  6. Hypertension.  7. Dyslipidemia.  8. History of atrial fibrillation.  9. BPH.  10.Degenerative arthritis of the knees.  11.History of TIAs.  12.Palpable left axillary masses.   REVIEW OF SYSTEMS:   Constitutional: Denies fevers, chills or abnormal weight loss Eyes: Denies blurriness of vision Ears, nose, mouth, throat, and face: Denies mucositis or sore throat Respiratory: Denies cough, dyspnea or wheezes Cardiovascular: Denies palpitation, chest discomfort or lower extremity swelling Gastrointestinal:  Denies nausea, heartburn or change in bowel habits Skin: Denies abnormal skin rashes Lymphatics: Denies new lymphadenopathy or easy bruising Neurological:Denies numbness, tingling or new weaknesses Behavioral/Psych: Mood is stable, no new changes  All other systems were reviewed with the patient and are  negative.  PHYSICAL EXAMINATION: ECOG PERFORMANCE STATUS: 0  Blood pressure 136/58, pulse 59, temperature 98.2 F (36.8 C), temperature source Oral, resp. rate 18, height 5\' 9"  (1.753 m), weight 195 lb 12.8 oz (88.814 kg).  GENERAL:alert, no distress and comfortable; in elderly gentleman who appears his stated age. SKIN: skin color, texture, turgor are normal, no rashes or significant lesions. EYES: normal, Conjunctiva are pink  and non-injected, sclera clear OROPHARYNX:no exudate, no erythema and lips, buccal mucosa, and tongue normal  NECK: supple, thyroid normal size, non-tender, without nodularity LYMPH:  no palpable lymphadenopathy in the cervical, axillary or supraclavicular LUNGS: clear to auscultation and percussion with normal breathing effort HEART: regular rate & rhythm and positive systolic ejection murmur and 1 plus lower extremity edema; pacemaker on the left. ABDOMEN:abdomen soft, non-tender and normal bowel sounds Musculoskeletal:no cyanosis of digits and no clubbing  NEURO: alert & oriented x 3 with fluent speech, no focal motor/sensory deficits   LABORATORY DATA: No results found for this or any previous visit (from the past 48 hour(s)).   Labs:  Lab Results  Component Value Date   WBC 9.8 03/28/2014   HGB 14.5 03/28/2014   HCT 43.5 03/28/2014   MCV 95.4 03/28/2014   PLT 200 03/28/2014   NEUTROABS 7.5* 03/28/2014      Chemistry      Component Value Date/Time   NA 139 03/28/2014 1306   NA 140 08/29/2013 1519   K 4.2 03/28/2014 1306   K 4.2 08/29/2013 1519   CL 102 08/29/2013 1519   CO2 30* 03/28/2014 1306   CO2 30 08/29/2013 1519   BUN 19.1 03/28/2014 1306   BUN 21 08/29/2013 1519   CREATININE 1.3 03/28/2014 1306   CREATININE 0.92 08/29/2013 1519   CREATININE 1.21 02/26/2013 1650      Component Value Date/Time   CALCIUM 10.1 03/28/2014 1306   CALCIUM 9.7 08/29/2013 1519   ALKPHOS 121 03/28/2014 1306   ALKPHOS 111 08/29/2013 1519   AST 16 03/28/2014 1306   AST 15 08/29/2013 1519   ALT 11 03/28/2014 1306   ALT 10 08/29/2013 1519   BILITOT 1.72* 03/28/2014 1306   BILITOT 0.9 08/29/2013 1519      RADIOGRAPHIC STUDIES: Dg Lumbar Spine Complete  02/26/2013   *RADIOLOGY REPORT*  Clinical Data: Status post fall.  Pain  LUMBAR SPINE - COMPLETE 4+ VIEW  Comparison: None  Findings: Normal alignment of the lumbar spine.  The vertebral body heights are well preserved.  There is multilevel disc space narrowing  and ventral endplate spurring identified. The transverse processes  are obscured by overlying bowel gas.  IMPRESSION:  1.  Lumbar spondylosis. 2.  No acute findings identified.   Original Report Authenticated By: Kerby Moors, M.D.    ASSESSMENT: [name] 80 y.o. male with a history of Cancer of parotid gland - Plan: CBC with Differential, Comprehensive metabolic panel (Cmet) - [facility], Influenza vac split quadrivalent PF (FLUARIX) injection 0.5 mL   PLAN:  1.  Primary mucoepidermoid carcinoma of partoid gland. --Mr. Masini continues to be apparently disease free from his right body at tumor high-grade mucoepidermoid carcinoma now over 7 years.    2. Urothelial carcinoma in situ.  -- He remains under treatment careful surveillance regarding his-year-old the carcinoma in situ. He is seeing Dr.Ottelin every 3 months and has repeat cystoscopies.  3. Alcoholism with history of falls. --Patient did not want referral to social services for assistance with alcohol abuse. He will continue to abstain from  alcohol in effort to prevent further falls. We advised him to give our office is a call should he need further assistance in this effort. We counseled him extensively on fall preventive measurements.  4. Thrombocytosis.  --His platelets are at 200,000 today. This has resolved.  5. Follow-up.  He will followup with [place] in 12 months time at that time we'll check CBC and chemistries. He got flu shot today.  All questions were answered. The patient knows to call the clinic with any problems, questions or concerns. We can certainly see the patient much sooner if necessary.  I spent 15 minutes counseling the patient face to face. The total time spent in the appointment was 25 minutes.    Bernadene Bell, MD Medical Hematologist/Oncologist [place] Pager: [phone] Office No: [phone]

## 2014-04-03 ENCOUNTER — Ambulatory Visit (INDEPENDENT_AMBULATORY_CARE_PROVIDER_SITE_OTHER): Payer: Medicare Other | Admitting: Cardiovascular Disease

## 2014-04-03 ENCOUNTER — Encounter: Payer: Self-pay | Admitting: Cardiovascular Disease

## 2014-04-03 VITALS — BP 154/80 | HR 60 | Resp 20 | Ht 69.0 in | Wt 197.2 lb

## 2014-04-03 DIAGNOSIS — I4891 Unspecified atrial fibrillation: Secondary | ICD-10-CM

## 2014-04-03 DIAGNOSIS — I10 Essential (primary) hypertension: Secondary | ICD-10-CM

## 2014-04-03 DIAGNOSIS — I6523 Occlusion and stenosis of bilateral carotid arteries: Secondary | ICD-10-CM

## 2014-04-03 DIAGNOSIS — I350 Nonrheumatic aortic (valve) stenosis: Secondary | ICD-10-CM

## 2014-04-03 DIAGNOSIS — Z950 Presence of cardiac pacemaker: Secondary | ICD-10-CM

## 2014-04-03 NOTE — Patient Instructions (Addendum)
Your physician has recommended you make the following change in your medication:   TAPER THE METOPROLOL TO 1/2 TABLET DAILY X 1 WEEK THEN STOP. STOP THE DIGOXIN.   Your physician has requested that you have a carotid duplex. This test is an ultrasound of the carotid arteries in your neck. It looks at blood flow through these arteries that supply the brain with blood. Allow one hour for this exam. There are no restrictions or special instructions.  Remote monitoring is used to monitor your pacemaker from home. This monitoring reduces the number of office visits required to check your device to one time per year. It allows [place] to keep an eye on the functioning of your device to ensure it is working properly. You are scheduled for a device check from home on 07-05-2014. You may send your transmission at any time that day. If you have a wireless device, the transmission will be sent automatically. After your physician reviews your transmission, you will receive a postcard with your next transmission date.  Your physician recommends that you schedule a follow-up appointment in: 12 months with Dr.Croitoru

## 2014-04-03 NOTE — Progress Notes (Signed)
Patient ID: [name], male   DOB: 01/03/1930, 80 y.o.   MRN: [id number]     Reason for office visit Pacemaker check, atrial fibrillation  [name] returns in followup for permanent atrial fibrillation and a pacemaker check. He initially received a dual chamber pacemaker in 1990 for sinus node dysfunction, but now has permanent atrial fibrillation with slow ventricular response. He underwent a single-chamber generator change out and received a new right ventricular lead in March of 2015 ([place]). He has little in the way of significant structural heart disease except for mild to moderate calcific aortic valve stenosis (mean gradient 16 mm Hg, estimated aortic valve area 1.3 cm square by echocardiogram April 2014). He has normal left ventricular systolic function and has a remote normal myocardial perfusion study.  He has a history of subdural hematoma and does not take warfarin. He is on clopidogrel for stroke prevention. Unfortunately he does have a history of transient ischemic attack.   He has treated hypertension and hyperlipidemia and diet-controlled diabetes mellitus. He has recently had numerous basal cell carcinomas of the skin removed including one on his lower left eyelid. He'll require additional resection and plastic surgery for reconstruction of his eyelid in the near future. 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 His physical activities are primarily limited by bilateral knee pain.  He has no cardiac complaints today. Interrogation of his pacemaker shows 100% ventricular pacing because of very slow conduction of atrial fibrillation. He checks his blood pressure occasionally at home and the systolic blood pressure is usually 140 mm Hg or less. His blood pressure is slightly high today.   Allergies  Allergen Reactions  . Codeine    Blisters between fingers  . Docetaxel Rash    Current Outpatient Prescriptions  Medication Sig Dispense Refill  . acetaminophen (TYLENOL) 325 MG tablet Take 1-2 tablets (325-650 mg total) by mouth every 4 (four) hours as needed for mild pain.      Marland Kitchen atorvastatin (LIPITOR) 40 MG tablet Take 40 mg by mouth every morning.       . clopidogrel (PLAVIX) 75 MG tablet Take 75 mg by mouth every morning.      . digoxin (LANOXIN) 0.25 MG tablet Take 0.125 mg by mouth every morning.       . finasteride (PROSCAR) 5 MG tablet Take 5 mg by mouth daily.      Marland Kitchen HYDROcodone-acetaminophen (NORCO/VICODIN) 5-325 MG per tablet Take 1-2 tablets by mouth every 4 (four) hours as needed for moderate pain.  20 tablet  0  . metoprolol succinate (TOPROL-XL) 25 MG 24 hr tablet Take 12.5-25 mg by mouth daily. Alternate 1 tablet with 0.5 tablet daily      . OVER THE COUNTER MEDICATION Take 1 tablet by mouth at bedtime. Pain/sleep. Aleve PM      . ramipril (ALTACE) 10 MG capsule Take 10 mg by mouth every morning.        No current facility-administered medications for this visit.    Past Medical History  Diagnosis Date  . Atrial fibrillation 1990  . Dyslipidemia   . Prostate hypertrophy   . Arthritis     degenerative-knees  . History of radiation therapy 05/05/07-06/15/07    right parotid through subclavicular region/ mid jugularlymph node chain  . Allergy   . Hypertension     labile  . Heart murmur  2/6 systolic  . History of chemotherapy      Hx carboplatin/2fu  . Xerostomia     limited  . Stroke 2003    TIA  . Bladder cancer 2010  . Cancer 05/05/07-06/15/07    parotid gland/66.4 GY  . HOH (hard of hearing)   . Sleep apnea     Not diagnosied; pt denies 03/29/12  . Pacemaker     2003 vent paced , rate 69  . Chronic kidney disease     positive cytology  . Dry eyes   . Diabetes mellitus without complication     meds d/c  1 year ago  . GERD (gastroesophageal reflux disease)   . History of  radiation therapy 05/05/07-06/15/07    Right parotid/hemineck,supraclavicular    Past Surgical History  Procedure Laterality Date  . Pacemaker insertion  1990    last changed 2003  . Cystoscopy  01/2011    neg  . Back surgery    . Parotidectomy      right  . Craniotomy  12/14/2011    Procedure: CRANIOTOMY HEMATOMA EVACUATION SUBDURAL;  Surgeon: Ophelia Charter, MD;  Location: [place] [facility];  Service: Neurosurgery;  Laterality: Left;  LEFT Craniotomy for subdural   . Neurofibroma excision  1980  . Cystoscopy with biopsy  04/01/2012    Procedure: CYSTOSCOPY WITH BIOPSY;  Surgeon: Claybon Jabs, MD;  Location: [facility];  Service: Urology;  Laterality: N/A;  BLADDER BIOPSIES and bladder washings  . Cystoscopy w/ retrogrades  04/01/2012    Procedure: CYSTOSCOPY WITH RETROGRADE PYELOGRAM;  Surgeon: Claybon Jabs, MD;  Location: [facility];  Service: Urology;  Laterality: Bilateral;  . Ureteroscopy  04/01/2012    Procedure: URETEROSCOPY;  Surgeon: Claybon Jabs, MD;  Location: [facility];  Service: Urology;  Laterality: Right;  . Cystoscopy/retrograde/ureteroscopy  05/02/2012    Procedure: CYSTOSCOPY/RETROGRADE/URETEROSCOPY;  Surgeon: Claybon Jabs, MD;  Location: [facility];  Service: Urology;  Laterality: Bilateral;  CYSTOSCOPY BILATERAL RETROGRADE PYLOGRAM BILATERAL URETEROSCOPY AND BIOPSY    . Cystoscopy with biopsy  05/02/2012    Procedure: CYSTOSCOPY WITH BIOPSY;  Surgeon: Claybon Jabs, MD;  Location: [facility];  Service: Urology;  Laterality: N/A;  . Cystoscopy with stent placement  05/02/2012    Procedure: CYSTOSCOPY WITH STENT PLACEMENT;  Surgeon: Claybon Jabs, MD;  Location: [facility];  Service: Urology;  Laterality: Bilateral;  . Pacemaker generator change  05/23/2002    [place], [place], serial [phone]  . Lower extremity arterial doppler  09/28/2011     No evidence of arterial insufficiency.   . Cardiovascular stress test  09/05/2009    Pharmalogical stress test w/o chest pain or EKG changes for ischemia  . Cardiac catheterization  09/20/2012    EF 50-55%, moderately calcified aortic valve leaflets, mild-moderate aortic valve stenosis, LA-appendage moderately dilated, moderate regurg of the mitral valve, RA moderately dilated    Family History  Problem Relation Age of Onset  . Heart disease Mother   . Cancer Father     Lung cancer  . Emphysema Brother   . Coronary artery disease Son     History   Social History  . Marital Status: Widowed    Spouse Name: N/A    Number of Children: N/A  . Years of Education: N/A   Occupational History  . Not on file.   Social History Main Topics  . Smoking status:  Never Smoker   . Smokeless tobacco: Never Used  . Alcohol Use: 10.5 oz/week    21 drink(s) per week     Comment: 3 /day( bourbon)  . Drug Use: No  . Sexual Activity: No   Other Topics Concern  . Not on file   Social History Narrative  . No narrative on file    Review of systems: The patient specifically denies any chest pain at rest or with exertion, dyspnea at rest or with exertion, orthopnea, paroxysmal nocturnal dyspnea, syncope, palpitations, focal neurological deficits, intermittent claudication, lower extremity edema, unexplained weight gain, cough, hemoptysis or wheezing.  The patient also denies abdominal pain, nausea, vomiting, dysphagia, diarrhea, constipation, polyuria, polydipsia, dysuria, hematuria, frequency, urgency, abnormal bleeding or bruising, fever, chills, unexpected weight changes, mood swings, change in skin or hair texture, change in voice quality, auditory or visual problems, allergic reactions or rashes, new musculoskeletal complaints other than usual "aches and pains".   PHYSICAL EXAM BP 154/80  Pulse 60  Resp 20  Ht 5\' 9"  (1.753 m)  Wt 89.449 kg (197 lb 3.2 oz)  BMI 29.11 kg/m2 General: Alert,  oriented x3, no distress  Head: no evidence of trauma, PERRL, EOMI, no exophtalmos or lid lag, erythematous left lower lid status post recent surgery , no myxedema, no xanthelasma; normal ears, nose and oropharynx  Neck: normal jugular venous pulsations and no hepatojugular reflux; brisk carotid pulses without delay and no carotid bruits; scar of previous Parotid surgery and radiation therapy  Chest: clear to auscultation, no signs of consolidation by percussion or palpation, normal fremitus, symmetrical and full respiratory excursions. Scar of right sided subclavian Port-A-Cath, healthy-appearing left subclavian pacemaker site.  Cardiovascular: normal position and quality of the apical impulse, regular rhythm, normal first and paradoxically split second heart sounds, no rubs or gallops. There is a grade 2-3/6 early peaking systolic ejection murmur in the aortic focus radiating toward the carotids and a fainter holosystolic murmur at the apex  Abdomen: no tenderness or distention, no masses by palpation, no abnormal pulsatility or arterial bruits, normal bowel sounds, no hepatosplenomegaly  Extremities: no clubbing, cyanosis or edema; 2+ radial, ulnar and brachial pulses bilaterally; 2+ right femoral, posterior tibial and dorsalis pedis pulses; 2+ left femoral, posterior tibial and dorsalis pedis pulses; no subclavian or femoral bruits  Neurological: grossly nonfocal   EKG: Atrial fibrillation ventricular pacing  Lipid Panel     Component Value Date/Time   CHOL 115 10/03/2011 0600   TRIG 63 10/03/2011 0600   HDL 55 10/03/2011 0600   CHOLHDL 2.1 10/03/2011 0600   VLDL 13 10/03/2011 0600   LDLCALC 47 10/03/2011 0600    BMET    Component Value Date/Time   NA 139 03/28/2014 1306   NA 140 08/29/2013 1519   K 4.2 03/28/2014 1306   K 4.2 08/29/2013 1519   CL 102 08/29/2013 1519   CO2 30* 03/28/2014 1306   CO2 30 08/29/2013 1519   GLUCOSE 186* 03/28/2014 1306   GLUCOSE 112* 08/29/2013 1519   BUN 19.1  03/28/2014 1306   BUN 21 08/29/2013 1519   CREATININE 1.3 03/28/2014 1306   CREATININE 0.92 08/29/2013 1519   CREATININE 1.21 02/26/2013 1650   CALCIUM 10.1 03/28/2014 1306   CALCIUM 9.7 08/29/2013 1519   GFRNONAA 54* 02/26/2013 1650   GFRAA 63* 02/26/2013 1650     ASSESSMENT AND PLAN Normal pacemaker function with 100% ventricular pacing. We'll try to discontinue all rate control medications to see if it'll reduce the  frequency of the pacing. He will wean off metoprolol and we'll stop digoxin.  Unfortunately cannot take oral anticoagulants due to history of subdural hematoma.  His blood pressure is marginally elevated today, but it is consistently normal at home. I have asked him to keep an eye on his blood pressure after he discontinues the metoprolol. He is taking a very small dose of this medication and it is doubtful that it is doing much as an antihypertensive. If his systolic blood pressure increases over 150 will probably add a low dose dihydropyridine or thiazide diuretic.  His carotid Dopplers are quite prominent today, much more so than one would expect from his cardiac murmur. I think we should repeat carotid Dopplers.  I have advised him to call me should he develop any exertional dyspnea/angina/syncope which could indicate progression of his aortic valve disease. This is really not expected for a few years. Repeat an echo if he becomes symptomatic. Orders Placed This Encounter  Procedures  . EKG 12-Lead   No orders of the defined types were placed in this encounter.    Holli Humbles, MD, [place] [phone] office [phone] pager

## 2014-04-06 LAB — MDC_IDC_ENUM_SESS_TYPE_INCLINIC
Implantable Pulse Generator Serial Number: 119617
Lead Channel Impedance Value: 761 Ohm
Lead Channel Sensing Intrinsic Amplitude: 25 mV
Lead Channel Setting Pacing Pulse Width: 0.4 ms
MDC IDC SESS DTM: 20151013040000
MDC IDC SET LEADCHNL RV PACING AMPLITUDE: 1.3 V
MDC IDC SET LEADCHNL RV SENSING SENSITIVITY: 2.5 mV
Zone Setting Detection Interval: 375 ms

## 2014-04-23 ENCOUNTER — Ambulatory Visit (HOSPITAL_COMMUNITY): Payer: Medicare Other

## 2014-04-23 ENCOUNTER — Ambulatory Visit (HOSPITAL_COMMUNITY)
Admission: RE | Admit: 2014-04-23 | Discharge: 2014-04-23 | Disposition: A | Discharge status: Home / Self Care | Payer: Medicare Other | Source: Ambulatory Visit | Attending: Cardiology | Admitting: Cardiology

## 2014-04-23 DIAGNOSIS — I6523 Occlusion and stenosis of bilateral carotid arteries: Secondary | ICD-10-CM | POA: Diagnosis present

## 2014-04-23 NOTE — Progress Notes (Signed)
Carotid Duplex Completed. °Brianna L Mazza,RVT °

## 2014-04-30 ENCOUNTER — Telehealth: Payer: Self-pay | Admitting: *Deleted

## 2014-04-30 DIAGNOSIS — I6523 Occlusion and stenosis of bilateral carotid arteries: Secondary | ICD-10-CM

## 2014-04-30 NOTE — Telephone Encounter (Signed)
-----   Message from Pixie Casino, MD sent at 04/30/2014  8:18 AM EST ----- Mild bilateral carotid artery disease.  Recommend repeat carotid dopplers in 1 year.  Dr. Lemmie Evens

## 2014-04-30 NOTE — Telephone Encounter (Signed)
Carotid doppler results called to patient.  Voiced understanding.  Order placed for repeat in one year.

## 2014-05-31 ENCOUNTER — Encounter (HOSPITAL_COMMUNITY): Payer: Self-pay | Admitting: Cardiovascular Disease

## 2014-07-05 ENCOUNTER — Ambulatory Visit (INDEPENDENT_AMBULATORY_CARE_PROVIDER_SITE_OTHER): Payer: Medicare Other | Admitting: *Deleted

## 2014-07-05 DIAGNOSIS — I4819 Other persistent atrial fibrillation: Secondary | ICD-10-CM

## 2014-07-05 DIAGNOSIS — I481 Persistent atrial fibrillation: Secondary | ICD-10-CM

## 2014-07-05 NOTE — Progress Notes (Signed)
Remote pacemaker transmission.   

## 2014-07-09 LAB — MDC_IDC_ENUM_SESS_TYPE_REMOTE
Battery Remaining Longevity: 114 mo
Battery Remaining Percentage: 100 %
Brady Statistic RV Percent Paced: 87 %
Date Time Interrogation Session: 20160114070100
Implantable Pulse Generator Serial Number: 119617
Lead Channel Impedance Value: 709 Ohm
Lead Channel Pacing Threshold Amplitude: 0.9 V
Lead Channel Pacing Threshold Pulse Width: 0.4 ms
Lead Channel Sensing Intrinsic Amplitude: 25 mV
Lead Channel Setting Pacing Amplitude: 1.4 V
Lead Channel Setting Pacing Pulse Width: 0.4 ms
Lead Channel Setting Sensing Sensitivity: 2.5 mV
Zone Setting Detection Interval: 375 ms

## 2014-07-12 ENCOUNTER — Telehealth: Payer: Self-pay | Admitting: Cardiovascular Disease

## 2014-07-12 NOTE — Telephone Encounter (Signed)
Please call,his blood pressure been up and down.Also been having chest pains off and on.

## 2014-07-12 NOTE — Telephone Encounter (Signed)
I added this pt to [facility] calendar for 1/26. Since we are closed tomorrow due to weather I explained soonest f/u available would be Monday or Tuesday. Pt voiced understanding. Pt aware of physician on-call line. He is instructed that if chest pain recurs to seek treatment in ED.

## 2014-07-12 NOTE — Telephone Encounter (Signed)
Re: Chest Pain - I spoke w/ this pt of Dr. Sallyanne Kuster w/ hx of aortic valve stenosis, carotid stenosis, HTN, Afib w/ SVR, rate adaptive pacemaker.  Original dual chamber pacer implanted in 1990. Pacemaker generator change out done March 2015. Recent download of pacemaker on 1/18 shows results unchanged from previous download.  He is c/o CP intermittently, notes an episode of left sided pain when standing up after stooping to pick something off the ground. He is typically finding pain in mid-chest. Denies CP currently. Slight SOB. He denies fatigue, dizziness, other pain. He is unsure if pain is reproducible.  He states BPs have been up and down, notes 1 diastolic reading above 704. BP given for most recent reading was 139/70. HR 68 reported  Also concerned about ankle swelling. Not currently on diuretic.  Do we need to evaluate in office?

## 2014-07-12 NOTE — Telephone Encounter (Signed)
I think it would be helpful to address these issues with an office visit.  Loray Akard Martinique MD, [facility]

## 2014-07-17 ENCOUNTER — Ambulatory Visit (INDEPENDENT_AMBULATORY_CARE_PROVIDER_SITE_OTHER): Payer: Medicare Other | Admitting: Physician Assistant

## 2014-07-17 ENCOUNTER — Encounter: Payer: Self-pay | Admitting: Physician Assistant

## 2014-07-17 VITALS — BP 168/80 | HR 70 | Ht 69.0 in | Wt 205.3 lb

## 2014-07-17 DIAGNOSIS — Z950 Presence of cardiac pacemaker: Secondary | ICD-10-CM

## 2014-07-17 DIAGNOSIS — I350 Nonrheumatic aortic (valve) stenosis: Secondary | ICD-10-CM

## 2014-07-17 DIAGNOSIS — R0789 Other chest pain: Secondary | ICD-10-CM

## 2014-07-17 DIAGNOSIS — I480 Paroxysmal atrial fibrillation: Secondary | ICD-10-CM

## 2014-07-17 DIAGNOSIS — R071 Chest pain on breathing: Secondary | ICD-10-CM | POA: Insufficient documentation

## 2014-07-17 DIAGNOSIS — I10 Essential (primary) hypertension: Secondary | ICD-10-CM

## 2014-07-17 MED ORDER — RAMIPRIL 10 MG PO CAPS: 20.0000 mg | ORAL_CAPSULE | Freq: Every day | ORAL | Status: DC

## 2014-07-17 MED ORDER — PANTOPRAZOLE SODIUM 40 MG PO TBEC: 40.0000 mg | DELAYED_RELEASE_TABLET | Freq: Every day | ORAL | Status: DC

## 2014-07-17 NOTE — Assessment & Plan Note (Signed)
Chest pain does not sound like angina although, he says his feels a dull ache. Today was the last time he felt it. He seems to have increased amount of belching however, it is not worse after eating. Reports may be a little bit more shortness of breath than usual. There was no diaphoresis.  It has occurred while at rest but not with ambulating to the mailbox and back. He reports that it feels like his heart is thumping at times.  About 10 days ago on Friday he had one episode of vomiting but none since.  EKG she shows intermittent pacing with diffuse T-wave inversions which were noted on EKG from 2013.  I have added Protonix he'll follow-up in one month.

## 2014-07-17 NOTE — Assessment & Plan Note (Signed)
A controlled. He is on Plavix

## 2014-07-17 NOTE — Assessment & Plan Note (Signed)
Blood pressure is poorly controlled patient reports his been elevated at home. I've increased his ramipril to 20 mg.

## 2014-07-17 NOTE — Assessment & Plan Note (Signed)
He reports a slight increase in shortness of breath but not much. He has lower extremity edema however he says that usually is improved by morning. There is no orthopnea. Lungs are clear on exam. I don't think a follow-up echo was required at this time.

## 2014-07-17 NOTE — Assessment & Plan Note (Signed)
Ears to be pacing a lot less frequent at least on his EKG was only 1 paced beat

## 2014-07-17 NOTE — Patient Instructions (Signed)
Your physician has recommended you make the following change in your medication: increase the ramipril to 20 mg daily ( 2 capsules). Take at the same time.  Take what you have on hand then fill prescription given today.  Start new prescription for pantoprazole 40 mg. This has been sent to your pharmacy.  Your physician recommends that you schedule a follow-up appointment in: 1 month with Dr. Sallyanne Kuster.

## 2014-07-17 NOTE — Progress Notes (Signed)
Patient ID: [name], male   DOB: [birth year], 81 y.o.   MRN: [id number]    Date:  07/17/2014   ID:  [name], DOB 02/02/1930, MRN [id number]  PCP:  [name], MD  Primary Cardiologist:  [facility]  Chief Complaint  Patient presents with  . ROV    patient has chest discomfort almost daily. slight SOB     History of Present Illness: [name] is a 81 y.o. male the history of pacemaker implant in 1990 for sinus node dysfunction with upgrade to a [place] in March 2015.  He received a new right ventricular lead. He has permanent atrial fibrillation and is on Plavix.  He has mild to moderate calcific aortic stenosis mean gradient of 16 mmHg and estimated valve area of 1.3 cm by echo in April 2014. His normal left ventricular systolic function and a remote normal myocardial perfusion study.  His history of a subdural hematoma which is why does not take warfarin. 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 This apparently will require additional resection and plastic surgery. 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 He has does little physical activity due to bilateral knee pain   He presents today with a complaint of chest pain  he describes as a dull ache.  It started approximately 10 days ago at which time he had an episode of vomiting. There's been no vomiting since. Today was the last time he felt it. He seems to have increased amount of belching however, the pain is not worse after eating. He reports a little bit more shortness of breath than usual but does not have a problem walking to the mailbox. There was no diaphoresis.  It has occurred while at rest but not with ambulating to the mailbox and back. He reports that it feels like his heart is thumping at  times.  He does have lower extremity edema which usually improves by morning. The patient currently denies  fever,  orthopnea, dizziness, PND, cough, congestion, abdominal pain, hematochezia, melena, claudication.  His weight is going up however he eats steak and biscuits at [employer] every day.  Wt Readings from Last 3 Encounters:  07/17/14 205 lb 4.8 oz (93.123 kg)  04/03/14 197 lb 3.2 oz (89.449 kg)  03/28/14 195 lb 12.8 oz (88.814 kg)     Past Medical History  Diagnosis Date  . Atrial fibrillation 1990  . Dyslipidemia   . Prostate hypertrophy   . Arthritis     degenerative-knees  . History of radiation therapy 05/05/07-06/15/07    right parotid through subclavicular region/ mid jugularlymph node chain  . Allergy   . Hypertension     labile  . Heart murmur     2/6 systolic  . History of chemotherapy      Hx carboplatin/11fu  . Xerostomia     limited  . Stroke 2003    TIA  . Bladder cancer 2010  . Cancer 05/05/07-06/15/07    parotid gland/66.4 GY  . HOH (hard of hearing)   . Sleep apnea     Not diagnosied; pt denies 03/29/12  . Pacemaker     2003 vent paced , rate 69  . Chronic kidney disease     positive cytology  . Dry eyes   . Diabetes mellitus without  complication     meds d/c  1 year ago  . GERD (gastroesophageal reflux disease)   . History of radiation therapy 05/05/07-06/15/07    Right parotid/hemineck,supraclavicular    Current Outpatient Prescriptions  Medication Sig Dispense Refill  . acetaminophen (TYLENOL) 325 MG tablet Take 1-2 tablets (325-650 mg total) by mouth every 4 (four) hours as needed for mild pain.    Marland Kitchen atorvastatin (LIPITOR) 40 MG tablet Take 40 mg by mouth every morning.     . clopidogrel (PLAVIX) 75 MG tablet Take 75 mg by mouth every morning.    Marland Kitchen erythromycin ophthalmic ointment Place 1 application into the left eye daily.    . finasteride (PROSCAR) 5 MG tablet Take 5 mg by mouth daily.    Marland Kitchen HYDROcodone-acetaminophen (NORCO/VICODIN)  5-325 MG per tablet Take 1-2 tablets by mouth every 4 (four) hours as needed for moderate pain. 20 tablet 0  . mupirocin ointment (BACTROBAN) 2 %   0  . triamcinolone cream (KENALOG) 0.1 %   0  . pantoprazole (PROTONIX) 40 MG tablet Take 1 tablet (40 mg total) by mouth daily. 30 tablet 11  . ramipril (ALTACE) 10 MG capsule Take 2 capsules (20 mg total) by mouth daily. 60 capsule 6   No current facility-administered medications for this visit.    Allergies:    Allergies  Allergen Reactions  . Codeine     Blisters between fingers  . Docetaxel Rash    Social History:  The patient  reports that he has never smoked. He has never used smokeless tobacco. He reports that he drinks about 10.5 oz of alcohol per week. He reports that he does not use illicit drugs.   Family history:   Family History  Problem Relation Age of Onset  . Heart disease Mother   . Cancer Father     Lung cancer  . Emphysema Brother   . Coronary artery disease Son     ROS:  Please see the history of present illness.  All other systems reviewed and negative.   PHYSICAL EXAM: VS:  BP 168/80 mmHg  Pulse 70  Ht 5\' 9"  (1.753 m)  Wt 205 lb 4.8 oz (93.123 kg)  BMI 30.30 kg/m2 Well nourished, well developed, in no acute distress HEENT: Pupils are equal round react to light accommodation extraocular movements are intact.  Neck: no JVDNo cervical lymphadenopathy. Cardiac: Regular rate and rhythm without murmurs rubs or gallops. Lungs:  clear to auscultation bilaterally, no wheezing, rhonchi or rales Abd: soft, nontender, positive bowel sounds all quadrants, no hepatosplenomegaly Ext: 1+ lower extremity edema.  2+ radial and dorsalis pedis pulses. Skin: warm and dry Neuro:  Grossly normal  EKG:  Atrial fibrillation with controlled ventricular rate of 70 bpm. There is periodic V-paced beats. His diffuse T-wave inversions  ASSESSMENT AND PLAN:  Problem List Items Addressed This Visit    Permanent atrial  fibrillation - Primary    A controlled. He is on Plavix      Relevant Medications   ramipril (ALTACE) capsule   Other Relevant Orders   EKG 12-Lead   Pacemaker    Ears to be pacing a lot less frequent at least on his EKG was only 1 paced beat      Hypertension    Blood pressure is poorly controlled and could be related to increased sodium intake as it sounds like his diet is not well managed. He reports his BP has been elevated at home. I've increased his ramipril to  20 mg.        Relevant Medications   ramipril (ALTACE) capsule   Chest pain    Chest pain does not sound like angina although, he says his feels a dull ache. Today was the last time he felt it. He seems to have increased amount of belching however, it is not worse after eating. Reports may be a little bit more shortness of breath than usual. There was no diaphoresis.  It has occurred while at rest but not with ambulating to the mailbox and back. He reports that it feels like his heart is thumping at times.  About 10 days ago on Friday he had one episode of vomiting but none since.  EKG she shows intermittent pacing with diffuse T-wave inversions which were noted on EKG from 2013.  I have added Protonix he'll follow-up in one month.      Aortic stenosis    He reports a slight increase in shortness of breath but not much. He has lower extremity edema however he says that usually is improved by morning. There is no orthopnea. Lungs are clear on exam. I don't think a follow-up echo was required at this time.      Relevant Medications   ramipril (ALTACE) capsule

## 2014-07-19 ENCOUNTER — Encounter: Payer: Self-pay | Admitting: Cardiology

## 2014-07-25 ENCOUNTER — Encounter: Payer: Self-pay | Admitting: Cardiovascular Disease

## 2014-08-20 ENCOUNTER — Encounter: Payer: Self-pay | Admitting: Cardiovascular Disease

## 2014-08-20 ENCOUNTER — Ambulatory Visit (INDEPENDENT_AMBULATORY_CARE_PROVIDER_SITE_OTHER): Payer: Medicare Other | Admitting: Cardiovascular Disease

## 2014-08-20 VITALS — BP 152/68 | HR 80 | Ht 69.0 in | Wt 201.1 lb

## 2014-08-20 DIAGNOSIS — I10 Essential (primary) hypertension: Secondary | ICD-10-CM

## 2014-08-20 DIAGNOSIS — I482 Chronic atrial fibrillation: Secondary | ICD-10-CM

## 2014-08-20 DIAGNOSIS — I4821 Permanent atrial fibrillation: Secondary | ICD-10-CM

## 2014-08-20 DIAGNOSIS — I4819 Other persistent atrial fibrillation: Secondary | ICD-10-CM

## 2014-08-20 DIAGNOSIS — I350 Nonrheumatic aortic (valve) stenosis: Secondary | ICD-10-CM

## 2014-08-20 DIAGNOSIS — Z950 Presence of cardiac pacemaker: Secondary | ICD-10-CM

## 2014-08-20 DIAGNOSIS — I481 Persistent atrial fibrillation: Secondary | ICD-10-CM

## 2014-08-20 LAB — MDC_IDC_ENUM_SESS_TYPE_INCLINIC
Implantable Pulse Generator Serial Number: 119617
Lead Channel Impedance Value: 731 Ohm
Lead Channel Pacing Threshold Pulse Width: 0.4 ms
Lead Channel Sensing Intrinsic Amplitude: 25 mV
Lead Channel Setting Pacing Amplitude: 1.3 V
MDC IDC MSMT LEADCHNL RV PACING THRESHOLD AMPLITUDE: 0.8 V
MDC IDC SET LEADCHNL RV PACING PULSEWIDTH: 0.4 ms
MDC IDC SET LEADCHNL RV SENSING SENSITIVITY: 2.5 mV
MDC IDC SET ZONE DETECTION INTERVAL: 375 ms
MDC IDC STAT BRADY RV PERCENT PACED: 84 %

## 2014-08-20 NOTE — Patient Instructions (Signed)
Remote monitoring is used to monitor your Pacemaker from home. This monitoring reduces the number of office visits required to check your device to one time per year. It allows [place] to monitor the functioning of your device to ensure it is working properly. You are scheduled for a device check from home on November 21, 2014. You may send your transmission at any time that day. If you have a wireless device, the transmission will be sent automatically. After your physician reviews your transmission, you will receive a postcard with your next transmission date.  Dr. Sallyanne Kuster recommends that you schedule a follow-up appointment in: 6 Months with pacer check.

## 2014-08-20 NOTE — Progress Notes (Signed)
Patient ID: [name], male   DOB: [birth year], 81 y.o.   MRN: [id number]      Reason for office visit Permanent atrial fibrillation, pacemaker check, follow-up chest pain  Mr. [name] returns in followup after seeing Tarri Fuller with complaints of chest discomfort about a month ago. At that time it occurred following an episode of vomiting. A proton pump inhibitor was added. For a while his blood pressure and heart rate were both high, but this resolved almost immediately after that visit and has not happened since.   Home BP is consistently in the high 130s/60s to 70. He is only taking the ramipril 10 mg once daily.  He had a dual-chamber permanent pacemaker implanted in 1990 for sinus node dysfunction and in March 2015 receive the new generator and the new ventricular lead for lead malfunction. He was downgraded to a single chamber device due to permanent atrial fibrillation. He has no known problems with coronary disease and in 2011 had a normal myocardial perfusion study. He has preserved left ventricular systolic function but has mild to moderate calcific aortic valve stenosis (by echo April 2014 estimated valve area around 1.3 mean gradient 16 mm Hg) and moderate mitral insufficiency.  He has a history of subdural hematoma and is therefore not taking warfarin. He is on clopidogrel for stroke prevention. He has treated systemic hypertension and hyperlipidemia. He bears a diagnosis of diabetes mellitus but no longer takes any medications for this. 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   Allergies  Allergen Reactions  . Codeine     Blisters between fingers  . Docetaxel Rash    Current Outpatient Prescriptions  Medication Sig Dispense Refill  . acetaminophen (TYLENOL) 325 MG tablet Take 1-2 tablets  (325-650 mg total) by mouth every 4 (four) hours as needed for mild pain.    Marland Kitchen atorvastatin (LIPITOR) 40 MG tablet Take 40 mg by mouth every morning.     . clopidogrel (PLAVIX) 75 MG tablet Take 75 mg by mouth every morning.    Marland Kitchen erythromycin ophthalmic ointment Place 1 application into the left eye daily.    . finasteride (PROSCAR) 5 MG tablet Take 5 mg by mouth daily.    Marland Kitchen HYDROcodone-acetaminophen (NORCO/VICODIN) 5-325 MG per tablet Take 1-2 tablets by mouth every 4 (four) hours as needed for moderate pain. 20 tablet 0  . pantoprazole (PROTONIX) 40 MG tablet Take 1 tablet (40 mg total) by mouth daily. 30 tablet 11  . ramipril (ALTACE) 10 MG capsule Take 2 capsules (20 mg total) by mouth daily. 60 capsule 6   No current facility-administered medications for this visit.    Past Medical History  Diagnosis Date  . Atrial fibrillation 1990  . Dyslipidemia   . Prostate hypertrophy   . Arthritis     degenerative-knees  . History of radiation therapy 05/05/07-06/15/07    right parotid through subclavicular region/ mid jugularlymph node chain  . Allergy   . Hypertension     labile  . Heart murmur     2/6 systolic  . History of chemotherapy      Hx carboplatin/26fu  . Xerostomia     limited  . Stroke 2003    TIA  . Bladder cancer 2010  . Cancer 05/05/07-06/15/07    parotid gland/66.4 GY  . HOH (hard of hearing)   .  Sleep apnea     Not diagnosied; pt denies 03/29/12  . Pacemaker     2003 vent paced , rate 69  . Chronic kidney disease     positive cytology  . Dry eyes   . Diabetes mellitus without complication     meds d/c  1 year ago  . GERD (gastroesophageal reflux disease)   . History of radiation therapy 05/05/07-06/15/07    Right parotid/hemineck,supraclavicular    Past Surgical History  Procedure Laterality Date  . Pacemaker insertion  1990    last changed 2003  . Cystoscopy  01/2011    neg  . Back surgery    . Parotidectomy      right  . Craniotomy  12/14/2011     Procedure: CRANIOTOMY HEMATOMA EVACUATION SUBDURAL;  Surgeon: Ophelia Charter, MD;  Location: [facility];  Service: Neurosurgery;  Laterality: Left;  LEFT Craniotomy for subdural   . Neurofibroma excision  1980  . Cystoscopy with biopsy  04/01/2012    Procedure: CYSTOSCOPY WITH BIOPSY;  Surgeon: Claybon Jabs, MD;  Location: [facility];  Service: Urology;  Laterality: N/A;  BLADDER BIOPSIES and bladder washings  . Cystoscopy w/ retrogrades  04/01/2012    Procedure: CYSTOSCOPY WITH RETROGRADE PYELOGRAM;  Surgeon: Claybon Jabs, MD;  Location: [facility];  Service: Urology;  Laterality: Bilateral;  . Ureteroscopy  04/01/2012    Procedure: URETEROSCOPY;  Surgeon: Claybon Jabs, MD;  Location: [facility];  Service: Urology;  Laterality: Right;  . Cystoscopy/retrograde/ureteroscopy  05/02/2012    Procedure: CYSTOSCOPY/RETROGRADE/URETEROSCOPY;  Surgeon: Claybon Jabs, MD;  Location: [facility];  Service: Urology;  Laterality: Bilateral;  CYSTOSCOPY BILATERAL RETROGRADE PYLOGRAM BILATERAL URETEROSCOPY AND BIOPSY    . Cystoscopy with biopsy  05/02/2012    Procedure: CYSTOSCOPY WITH BIOPSY;  Surgeon: Claybon Jabs, MD;  Location: [facility];  Service: Urology;  Laterality: N/A;  . Cystoscopy with stent placement  05/02/2012    Procedure: CYSTOSCOPY WITH STENT PLACEMENT;  Surgeon: Claybon Jabs, MD;  Location[facility];  Service: Urology;  Laterality: Bilateral;  . Pacemaker generator change  05/23/2002    [place], [place], serial [id number]  . Lower extremity arterial doppler  09/28/2011    No evidence of arterial insufficiency.   . Cardiovascular stress test  09/05/2009    Pharmalogical stress test w/o chest pain or EKG changes for ischemia  . Cardiac catheterization  09/20/2012    EF 50-55%, moderately calcified aortic valve leaflets, mild-moderate aortic valve stenosis,  LA-appendage moderately dilated, moderate regurg of the mitral valve, RA moderately dilated  . Pacemaker generator change N/A 09/01/2013    Procedure: PACEMAKER GENERATOR CHANGE;  Surgeon: Sanda Klein, MD;  Location: [facility];  Service: Cardiovascular;  Laterality: N/A;    Family History  Problem Relation Age of Onset  . Heart disease Mother   . Cancer Father     Lung cancer  . Emphysema Brother   . Coronary artery disease Son     History   Social History  . Marital Status: Widowed    Spouse Name: N/A  . Number of Children: N/A  . Years of Education: N/A   Occupational History  . Not on file.   Social History Main Topics  . Smoking status: Never Smoker   . Smokeless tobacco: Never Used  . Alcohol Use: 10.5 oz/week    21 drink(s) per week  Comment: 3 /day( bourbon)  . Drug Use: No  . Sexual Activity: No   Other Topics Concern  . Not on file   Social History Narrative    Review of systems: The patient specifically denies any chest pain at rest or with exertion, dyspnea at rest or with exertion, orthopnea, paroxysmal nocturnal dyspnea, syncope, palpitations, focal neurological deficits, intermittent claudication, lower extremity edema, unexplained weight gain, cough, hemoptysis or wheezing.  The patient also denies abdominal pain, nausea, vomiting, dysphagia, diarrhea, constipation, polyuria, polydipsia, dysuria, hematuria, frequency, urgency, abnormal bleeding or bruising, fever, chills, unexpected weight changes, mood swings, change in skin or hair texture, change in voice quality, auditory or visual problems, allergic reactions or rashes, new musculoskeletal complaints other than usual "aches and pains".   PHYSICAL EXAM BP 152/68 mmHg  Pulse 80  Ht 5\' 9"  (1.753 m)  Wt 201 lb 1.6 oz (91.218 kg)  BMI 29.68 kg/m2  General: Alert, oriented x3, no distress Head: no evidence of trauma, PERRL, EOMI, no exophtalmos or lid lag, erythematous left lower lid, no  myxedema, no xanthelasma; normal ears, nose and oropharynx Neck: normal jugular venous pulsations and no hepatojugular reflux; brisk carotid pulses without delay and no carotid bruits, scar of previous surgery and radiation therapy for parotid gland cancer Chest: clear to auscultation, no signs of consolidation by percussion or palpation, normal fremitus, symmetrical and full respiratory excursions, healthy left subclavian pacemaker site, right-sided scar of subclavian Port-A-Cath Cardiovascular: normal position and quality of the apical impulse, regular rhythm, normal first and second heart sounds, 2/6 early peaking systolic ejection aortic murmur, faint apical holosystolic murmur, no rubs or gallops Abdomen: no tenderness or distention, no masses by palpation, no abnormal pulsatility or arterial bruits, normal bowel sounds, no hepatosplenomegaly Extremities: no clubbing, cyanosis or edema; 2+ radial, ulnar and brachial pulses bilaterally; 2+ right femoral, posterior tibial and dorsalis pedis pulses; 2+ left femoral, posterior tibial and dorsalis pedis pulses; no subclavian or femoral bruits Neurological: grossly nonfocal   EKG: 100% ventricular pacing  Lipid Panel     Component Value Date/Time   CHOL 115 10/03/2011 0600   TRIG 63 10/03/2011 0600   HDL 55 10/03/2011 0600   CHOLHDL 2.1 10/03/2011 0600   VLDL 13 10/03/2011 0600   LDLCALC 47 10/03/2011 0600    BMET    Component Value Date/Time   NA 139 03/28/2014 1306   NA 140 08/29/2013 1519   K 4.2 03/28/2014 1306   K 4.2 08/29/2013 1519   CL 102 08/29/2013 1519   CO2 30* 03/28/2014 1306   CO2 30 08/29/2013 1519   GLUCOSE 186* 03/28/2014 1306   GLUCOSE 112* 08/29/2013 1519   BUN 19.1 03/28/2014 1306   BUN 21 08/29/2013 1519   CREATININE 1.3 03/28/2014 1306   CREATININE 0.92 08/29/2013 1519   CREATININE 1.21 02/26/2013 1650   CALCIUM 10.1 03/28/2014 1306   CALCIUM 9.7 08/29/2013 1519   GFRNONAA 54* 02/26/2013 1650   GFRAA 63*  02/26/2013 1650     ASSESSMENT AND PLAN Chest pain This remains unexplained, but the association with vomiting and the improvement after addition of proton pump inhibitor suggest possible gastroesophageal etiology. Cannot exclude coronary disease, but this appears less likely since he has no exertional symptoms. Hypertension Adequate blood pressure control and 10 mg ramipril Permanent atrial fibrillation Unable to anticoagulate due to history of subdural hematoma; on antiplatelet therapy Single chamber permanent pacemaker Normal device function patient today shows 84% ventricular pacing, excellent lead parameters, generator at beginning of  life, no meaningful tachyarrhythmia (recording at greater than 160)  Vang Kraeger  Sanda Klein, MD, [facility] [phone] office [phone] pager

## 2014-08-30 ENCOUNTER — Encounter: Payer: Self-pay | Admitting: Cardiovascular Disease

## 2014-11-21 ENCOUNTER — Ambulatory Visit (INDEPENDENT_AMBULATORY_CARE_PROVIDER_SITE_OTHER): Payer: Medicare Other | Admitting: *Deleted

## 2014-11-21 DIAGNOSIS — I480 Paroxysmal atrial fibrillation: Secondary | ICD-10-CM | POA: Diagnosis not present

## 2014-11-21 NOTE — Progress Notes (Signed)
Remote pacemaker transmission.   

## 2014-11-27 LAB — CUP PACEART REMOTE DEVICE CHECK
Battery Remaining Longevity: 114 mo
Brady Statistic RV Percent Paced: 78 %
Date Time Interrogation Session: 20160601060100
Lead Channel Impedance Value: 713 Ohm
Lead Channel Pacing Threshold Amplitude: 0.9 V
Lead Channel Pacing Threshold Pulse Width: 0.4 ms
Lead Channel Setting Pacing Amplitude: 1.4 V
MDC IDC MSMT BATTERY REMAINING PERCENTAGE: 100 %
MDC IDC SET LEADCHNL RV PACING PULSEWIDTH: 0.4 ms
MDC IDC SET LEADCHNL RV SENSING SENSITIVITY: 2.5 mV
Pulse Gen Serial Number: 119617
Zone Setting Detection Interval: 375 ms

## 2014-12-11 ENCOUNTER — Encounter: Payer: Self-pay | Admitting: Cardiology

## 2014-12-12 ENCOUNTER — Encounter: Payer: Self-pay | Admitting: Cardiovascular Disease

## 2015-01-07 ENCOUNTER — Other Ambulatory Visit: Payer: Self-pay | Admitting: Urology

## 2015-01-08 NOTE — Progress Notes (Signed)
Called please release orders in Epic surgery 01-21-15 pre op 01-16-15 Thanks

## 2015-01-14 ENCOUNTER — Telehealth: Payer: Self-pay | Admitting: Cardiovascular Disease

## 2015-01-14 NOTE — Telephone Encounter (Signed)
Request for clearance w/ Plavix hold - apparently begun yesterday. Will route to [place] for advice.

## 2015-01-14 NOTE — Telephone Encounter (Signed)
He having surgery on 01-21-15. Can he stop his Plavix for 7 days,starting yesterday? He is having a bladder biopsy and ureteroscopy. Please docuyment this in epic if this is ok. Please let her know when you do this in epic.

## 2015-01-14 NOTE — Telephone Encounter (Signed)
Reviewed with Dr. Debara Pickett (DOD, Dr. Loletha Grayer out of office).  Ok to hold plavix x 7 days for procedure, restart day after.

## 2015-01-16 ENCOUNTER — Encounter (HOSPITAL_COMMUNITY): Payer: Self-pay

## 2015-01-16 ENCOUNTER — Encounter (HOSPITAL_COMMUNITY)
Admission: RE | Admit: 2015-01-16 | Discharge: 2015-01-16 | Disposition: A | Discharge status: Home / Self Care | Payer: Medicare Other | Source: Ambulatory Visit | Attending: Urology | Admitting: Urology

## 2015-01-16 DIAGNOSIS — Z01818 Encounter for other preprocedural examination: Secondary | ICD-10-CM | POA: Insufficient documentation

## 2015-01-16 HISTORY — DX: Unilateral inguinal hernia, without obstruction or gangrene, not specified as recurrent: K40.90

## 2015-01-16 HISTORY — DX: Cardiac arrhythmia, unspecified: I49.9

## 2015-01-16 HISTORY — DX: Urinary tract infection, site not specified: N39.0

## 2015-01-16 HISTORY — DX: Atherosclerotic heart disease of native coronary artery without angina pectoris: I25.10

## 2015-01-16 HISTORY — DX: Tinnitus, unspecified ear: H93.19

## 2015-01-16 HISTORY — DX: Pneumonia, unspecified organism: J18.9

## 2015-01-16 LAB — CBC
HCT: 42.1 % (ref 39.0–52.0)
HEMOGLOBIN: 14 g/dL (ref 13.0–17.0)
MCH: 34.1 pg — AB (ref 26.0–34.0)
MCHC: 33.3 g/dL (ref 30.0–36.0)
MCV: 102.4 fL — ABNORMAL HIGH (ref 78.0–100.0)
PLATELETS: 237 10*3/uL (ref 150–400)
RBC: 4.11 MIL/uL — ABNORMAL LOW (ref 4.22–5.81)
RDW: 13.1 % (ref 11.5–15.5)
WBC: 9.2 10*3/uL (ref 4.0–10.5)

## 2015-01-16 LAB — COMPREHENSIVE METABOLIC PANEL
ALK PHOS: 99 U/L (ref 38–126)
ALT: 12 U/L — ABNORMAL LOW (ref 17–63)
AST: 22 U/L (ref 15–41)
Albumin: 4.1 g/dL (ref 3.5–5.0)
Anion gap: 10 (ref 5–15)
BILIRUBIN TOTAL: 1.8 mg/dL — AB (ref 0.3–1.2)
BUN: 15 mg/dL (ref 6–20)
CALCIUM: 9.4 mg/dL (ref 8.9–10.3)
CO2: 29 mmol/L (ref 22–32)
Chloride: 101 mmol/L (ref 101–111)
Creatinine, Ser: 1.05 mg/dL (ref 0.61–1.24)
GFR calc Af Amer: >60 mL/min (ref >60)
GLUCOSE: 123 mg/dL — AB (ref 65–99)
POTASSIUM: 4.7 mmol/L (ref 3.5–5.1)
Sodium: 140 mmol/L (ref 135–145)
Total Protein: 8 g/dL (ref 6.5–8.1)

## 2015-01-16 NOTE — Progress Notes (Addendum)
01/14/2015 telephone note/epic per Dr Debara Pickett / cardiology - discusses awareness of surgery; instructions noted in regards to plavix. Pt is aware. LOV epic per Dr Sallyanne Kuster 08/20/2014 Carotid doppler 04/23/2014 / epic  ECHO epic 09/20/2012

## 2015-01-16 NOTE — Patient Instructions (Signed)
[name]  01/16/2015   Your procedure is scheduled on: Monday January 21, 2015   Report to [facility] Main  Entrance take [place]  elevators to 3rd floor to  [name] at 8:00 AM.  Call this number if you have problems the morning of surgery [phone]   Remember: ONLY 1 PERSON MAY GO WITH YOU TO SHORT STAY TO GET  READY MORNING OF [name].  Do not eat food or drink liquids :After Midnight.     Take these medicines the morning of surgery with A SIP OF WATER: Hydrocodone-Acetaminophen if needed; Pantoprazole (Protonix)                               You may not have any metal on your body including hair pins and              piercings  Do not wear jewelry,  lotions, powders or colognes, deodorant                           Men may shave face and neck.   Do not bring valuables to the hospital. [name].  Contacts, dentures or bridgework may not be worn into surgery.     Patients discharged the day of surgery will not be allowed to drive home.  Name and phone number of your driver:[name] (daughter)  _____________________________________________________________________             [name] - Preparing for Surgery Before surgery, you can play an important role.  Because skin is not sterile, your skin needs to be as free of germs as possible.  You can reduce the number of germs on your skin by washing with CHG (chlorahexidine gluconate) soap before surgery.  CHG is an antiseptic cleaner which kills germs and bonds with the skin to continue killing germs even after washing. Please DO NOT use if you have an allergy to CHG or antibacterial soaps.  If your skin becomes reddened/irritated stop using the CHG and inform your nurse when you arrive at Short Stay. Do not shave (including legs and underarms) for at least 48 hours prior to the first CHG shower.  You may shave your face/neck. Please  follow these instructions carefully:  1.  Shower with CHG Soap the night before surgery and the  morning of Surgery.  2.  If you choose to wash your hair, wash your hair first as usual with your  normal  shampoo.  3.  After you shampoo, rinse your hair and body thoroughly to remove the  shampoo.                           4.  Use CHG as you would any other liquid soap.  You can apply chg directly  to the skin and wash                       Gently with a scrungie or clean washcloth.  5.  Apply the CHG Soap to your body ONLY FROM THE NECK DOWN.   Do not use on face/ open  Wound or open sores. Avoid contact with eyes, ears mouth and genitals (private parts).                       Wash face,  Genitals (private parts) with your normal soap.             6.  Wash thoroughly, paying special attention to the area where your surgery  will be performed.  7.  Thoroughly rinse your body with warm water from the neck down.  8.  DO NOT shower/wash with your normal soap after using and rinsing off  the CHG Soap.                9.  Pat yourself dry with a clean towel.            10.  Wear clean pajamas.            11.  Place clean sheets on your bed the night of your first shower and do not  sleep with pets. Day of Surgery : Do not apply any lotions/deodorants the morning of surgery.  Please wear clean clothes to the hospital/surgery center.  FAILURE TO FOLLOW THESE INSTRUCTIONS MAY RESULT IN THE CANCELLATION OF YOUR SURGERY PATIENT SIGNATURE_________________________________  NURSE SIGNATURE__________________________________  ________________________________________________________________________

## 2015-01-16 NOTE — Progress Notes (Signed)
Spoke with Corene Cornea with [employer] in regards to pts pacemaker with the scheduled surgical procedure. Corene Cornea stated will check with Dr Sallyanne Kuster for any further instructions or needs prior to scheduled procedure.

## 2015-01-16 NOTE — Progress Notes (Signed)
Dr B Judd/anesthesia reviewed EKGs per today 01/16/2015, 07/17/2014 and 04/03/2014. Anesthesia to see pt day of surgery.

## 2015-01-17 ENCOUNTER — Encounter (HOSPITAL_COMMUNITY): Payer: Self-pay

## 2015-01-17 NOTE — Progress Notes (Signed)
Your patient has screened at an elevated risk for Obstructive Sleep Apnea using the Stop-Bang Tool during a pre-surgical vist. A score of 4 or greater is an elevated risk. Score of 4. 

## 2015-01-21 ENCOUNTER — Encounter (HOSPITAL_COMMUNITY)
Admission: RE | Disposition: A | Discharge status: Home / Self Care | Payer: Self-pay | Source: Ambulatory Visit | Attending: Urology

## 2015-01-21 ENCOUNTER — Encounter (HOSPITAL_COMMUNITY): Payer: Self-pay

## 2015-01-21 ENCOUNTER — Ambulatory Visit (HOSPITAL_COMMUNITY)
Admission: RE | Admit: 2015-01-21 | Discharge: 2015-01-21 | Disposition: A | Discharge status: Home / Self Care | Payer: Medicare Other | Source: Ambulatory Visit | Attending: Urology | Admitting: Urology

## 2015-01-21 ENCOUNTER — Ambulatory Visit (HOSPITAL_COMMUNITY): Payer: Medicare Other | Admitting: Anesthesiology

## 2015-01-21 DIAGNOSIS — N3091 Cystitis, unspecified with hematuria: Secondary | ICD-10-CM | POA: Diagnosis not present

## 2015-01-21 DIAGNOSIS — R300 Dysuria: Secondary | ICD-10-CM | POA: Insufficient documentation

## 2015-01-21 DIAGNOSIS — N401 Enlarged prostate with lower urinary tract symptoms: Secondary | ICD-10-CM | POA: Insufficient documentation

## 2015-01-21 DIAGNOSIS — Z950 Presence of cardiac pacemaker: Secondary | ICD-10-CM | POA: Diagnosis not present

## 2015-01-21 DIAGNOSIS — I10 Essential (primary) hypertension: Secondary | ICD-10-CM | POA: Insufficient documentation

## 2015-01-21 DIAGNOSIS — N400 Enlarged prostate without lower urinary tract symptoms: Secondary | ICD-10-CM | POA: Diagnosis not present

## 2015-01-21 DIAGNOSIS — E780 Pure hypercholesterolemia: Secondary | ICD-10-CM | POA: Insufficient documentation

## 2015-01-21 DIAGNOSIS — Z8673 Personal history of transient ischemic attack (TIA), and cerebral infarction without residual deficits: Secondary | ICD-10-CM | POA: Diagnosis not present

## 2015-01-21 DIAGNOSIS — Z8551 Personal history of malignant neoplasm of bladder: Secondary | ICD-10-CM | POA: Diagnosis not present

## 2015-01-21 DIAGNOSIS — E119 Type 2 diabetes mellitus without complications: Secondary | ICD-10-CM | POA: Insufficient documentation

## 2015-01-21 DIAGNOSIS — C651 Malignant neoplasm of right renal pelvis: Secondary | ICD-10-CM | POA: Insufficient documentation

## 2015-01-21 DIAGNOSIS — I4891 Unspecified atrial fibrillation: Secondary | ICD-10-CM | POA: Diagnosis not present

## 2015-01-21 DIAGNOSIS — Z791 Long term (current) use of non-steroidal anti-inflammatories (NSAID): Secondary | ICD-10-CM | POA: Diagnosis not present

## 2015-01-21 DIAGNOSIS — M1990 Unspecified osteoarthritis, unspecified site: Secondary | ICD-10-CM | POA: Diagnosis not present

## 2015-01-21 DIAGNOSIS — R310 Gross hematuria: Secondary | ICD-10-CM | POA: Diagnosis present

## 2015-01-21 DIAGNOSIS — Z79891 Long term (current) use of opiate analgesic: Secondary | ICD-10-CM | POA: Diagnosis not present

## 2015-01-21 DIAGNOSIS — Z79899 Other long term (current) drug therapy: Secondary | ICD-10-CM | POA: Insufficient documentation

## 2015-01-21 DIAGNOSIS — I2510 Atherosclerotic heart disease of native coronary artery without angina pectoris: Secondary | ICD-10-CM | POA: Diagnosis not present

## 2015-01-21 DIAGNOSIS — K219 Gastro-esophageal reflux disease without esophagitis: Secondary | ICD-10-CM | POA: Diagnosis not present

## 2015-01-21 DIAGNOSIS — Z8550 Personal history of malignant neoplasm of unspecified urinary tract organ: Secondary | ICD-10-CM | POA: Insufficient documentation

## 2015-01-21 DIAGNOSIS — D090 Carcinoma in situ of bladder: Secondary | ICD-10-CM

## 2015-01-21 DIAGNOSIS — N138 Other obstructive and reflux uropathy: Secondary | ICD-10-CM | POA: Diagnosis not present

## 2015-01-21 DIAGNOSIS — Z7902 Long term (current) use of antithrombotics/antiplatelets: Secondary | ICD-10-CM | POA: Diagnosis not present

## 2015-01-21 DIAGNOSIS — R339 Retention of urine, unspecified: Secondary | ICD-10-CM | POA: Insufficient documentation

## 2015-01-21 HISTORY — PX: CYSTOSCOPY/RETROGRADE/URETEROSCOPY: SHX5316

## 2015-01-21 LAB — GLUCOSE, CAPILLARY: Glucose-Capillary: 127 mg/dL — ABNORMAL HIGH (ref 65–99)

## 2015-01-21 SURGERY — CYSTOSCOPY/RETROGRADE/URETEROSCOPY
Anesthesia: General | Laterality: Bilateral

## 2015-01-21 MED ORDER — PHENAZOPYRIDINE HCL 200 MG PO TABS: 200.0000 mg | ORAL_TABLET | Freq: Three times a day (TID) | ORAL | Status: DC | PRN

## 2015-01-21 MED ORDER — LACTATED RINGERS IV SOLN
INTRAVENOUS | Status: DC | PRN
  Administered 2015-01-21: 11:00:00 via INTRAVENOUS

## 2015-01-21 MED ORDER — FENTANYL CITRATE (PF) 100 MCG/2ML IJ SOLN
INTRAMUSCULAR | Status: AC
  Filled 2015-01-21: qty 2

## 2015-01-21 MED ORDER — CIPROFLOXACIN IN D5W 200 MG/100ML IV SOLN
200.0000 mg | INTRAVENOUS | Status: AC
  Administered 2015-01-21: 200 mg via INTRAVENOUS
  Filled 2015-01-21: qty 100

## 2015-01-21 MED ORDER — LIDOCAINE HCL (CARDIAC) 20 MG/ML IV SOLN
INTRAVENOUS | Status: DC | PRN
  Administered 2015-01-21: 60 mg via INTRAVENOUS

## 2015-01-21 MED ORDER — FENTANYL CITRATE (PF) 100 MCG/2ML IJ SOLN
25.0000 ug | INTRAMUSCULAR | Status: DC | PRN
  Administered 2015-01-21 (×2): 50 ug via INTRAVENOUS

## 2015-01-21 MED ORDER — TAMSULOSIN HCL 0.4 MG PO CAPS
0.4000 mg | ORAL_CAPSULE | Freq: Once | ORAL | Status: AC
  Administered 2015-01-21: 0.4 mg via ORAL
  Filled 2015-01-21: qty 1

## 2015-01-21 MED ORDER — ONDANSETRON HCL 4 MG/2ML IJ SOLN
INTRAMUSCULAR | Status: DC | PRN
  Administered 2015-01-21: 4 mg via INTRAVENOUS

## 2015-01-21 MED ORDER — PROPOFOL 10 MG/ML IV BOLUS
INTRAVENOUS | Status: AC
  Filled 2015-01-21: qty 20

## 2015-01-21 MED ORDER — PHENAZOPYRIDINE HCL 200 MG PO TABS
200.0000 mg | ORAL_TABLET | Freq: Once | ORAL | Status: AC
  Administered 2015-01-21: 200 mg via ORAL

## 2015-01-21 MED ORDER — PHENYLEPHRINE HCL 10 MG/ML IJ SOLN
INTRAMUSCULAR | Status: DC | PRN
  Administered 2015-01-21 (×3): 40 ug via INTRAVENOUS

## 2015-01-21 MED ORDER — IOHEXOL 300 MG/ML  SOLN
INTRAMUSCULAR | Status: DC | PRN
  Administered 2015-01-21: 30 mL via URETHRAL

## 2015-01-21 MED ORDER — FENTANYL CITRATE (PF) 100 MCG/2ML IJ SOLN
INTRAMUSCULAR | Status: DC | PRN
  Administered 2015-01-21 (×2): 25 ug via INTRAVENOUS
  Administered 2015-01-21: 50 ug via INTRAVENOUS

## 2015-01-21 MED ORDER — PROPOFOL 10 MG/ML IV BOLUS
INTRAVENOUS | Status: DC | PRN
  Administered 2015-01-21: 150 mg via INTRAVENOUS

## 2015-01-21 MED ORDER — SODIUM CHLORIDE 0.9 % IR SOLN
Status: DC | PRN
  Administered 2015-01-21: 3000 mL via INTRAVESICAL

## 2015-01-21 MED ORDER — HYDROCODONE-ACETAMINOPHEN 10-325 MG PO TABS
1.0000 | ORAL_TABLET | ORAL | Status: DC | PRN
  Administered 2015-01-21: 1 via ORAL
  Filled 2015-01-21: qty 2
  Filled 2015-01-21: qty 1

## 2015-01-21 MED ORDER — HYDROCODONE-ACETAMINOPHEN 10-325 MG PO TABS: 1.0000 | ORAL_TABLET | ORAL | Status: DC | PRN

## 2015-01-21 MED ORDER — DEXTROSE 5 % IV SOLN
10.0000 mg | INTRAVENOUS | Status: DC | PRN
  Administered 2015-01-21: 50 ug/min via INTRAVENOUS

## 2015-01-21 MED ORDER — STERILE WATER FOR IRRIGATION IR SOLN
Status: DC | PRN
  Administered 2015-01-21: 5000 mL

## 2015-01-21 MED ORDER — PHENAZOPYRIDINE HCL 200 MG PO TABS
ORAL_TABLET | ORAL | Status: AC
  Filled 2015-01-21: qty 1

## 2015-01-21 SURGICAL SUPPLY — 14 items
BAG URO CATCHER STRL LF (DRAPE) ×2 IMPLANT
BASKET ZERO TIP NITINOL 2.4FR (BASKET) IMPLANT
BSKT STON RTRVL ZERO TP 2.4FR (BASKET)
CATH INTERMIT  6FR 70CM (CATHETERS) ×2 IMPLANT
CONT SPEC 4OZ CLIKSEAL STRL BL (MISCELLANEOUS) ×2 IMPLANT
GLOVE BIOGEL M 8.0 STRL (GLOVE) ×2 IMPLANT
GOWN STRL REUS W/ TWL XL LVL3 (GOWN DISPOSABLE) ×1 IMPLANT
GOWN STRL REUS W/TWL XL LVL3 (GOWN DISPOSABLE) ×4 IMPLANT
GUIDEWIRE ANG ZIPWIRE 038X150 (WIRE) IMPLANT
GUIDEWIRE STR DUAL SENSOR (WIRE) ×1 IMPLANT
MANIFOLD NEPTUNE II (INSTRUMENTS) ×2 IMPLANT
PACK CYSTO (CUSTOM PROCEDURE TRAY) ×2 IMPLANT
SYRINGE IRR TOOMEY STRL 70CC (SYRINGE) ×1 IMPLANT
TUBING CONNECTING 10 (TUBING) ×2 IMPLANT

## 2015-01-21 NOTE — Transfer of Care (Signed)
Immediate Anesthesia Transfer of Care Note  Patient: [name]  Procedure(s) Performed: Procedure(s): CYSTOSCOPY/RETROGRADE/ BLADDER BIOPSY (Bilateral)  Patient Location: PACU  Anesthesia Type:General  Level of Consciousness: awake, alert  and oriented  Airway & Oxygen Therapy: Patient Spontanous Breathing and Patient connected to face mask oxygen  Post-op Assessment: Report given to RN and Post -op Vital signs reviewed and stable  Post vital signs: Reviewed and stable  Last Vitals:  Filed Vitals:   01/21/15 0806  BP: 178/77  Pulse: 71  Temp: 36.7 C  Resp: 16    Complications: No apparent anesthesia complications

## 2015-01-21 NOTE — Op Note (Signed)
PATIENT:  [name]  PRE-OPERATIVE DIAGNOSIS: Positive urine cytology with history of CIS of the bladder.  POST-OPERATIVE DIAGNOSIS: Same  PROCEDURE: 1. Cystoscopy with bilateral retrograde pyelograms including interpretation. 2. Ureteral catheterization and renal pelvis barbotage bilaterally 3. Bladder biopsies  SURGEON:  Claybon Jabs  INDICATION: [name] is a 81 year old male with a history of bladder cancer and CIS who has been having a lot of bladder discomfort and reports passing what sounds like bits of tissue. Recent cystoscopic evaluation did not reveal any worrisome lesions within his bladder. A urine cytology was positive for cancer cells and he is therefore brought to the operating room for evaluation for the source of the positive cytology.  ANESTHESIA:  General  EBL:  Minimal  DRAINS: None  LOCAL MEDICATIONS USED:  None  SPECIMEN:  1. Barbotaged right and left renal pelvis for cytology. 2. Barbotaged of the bladder for cytology. 3. Cold cup biopsies of the anterior wall, posterior wall, dome and left wall of the bladder as well as bladder neck and prostatic urethra.  Description of procedure: After informed consent the patient was taken to the operating room and placed on the table in a supine position. General anesthesia was then administered. Once fully anesthetized the patient was moved to the dorsal lithotomy position and the genitalia were sterilely prepped and draped in standard fashion. An official timeout was then performed.  A 23 French cystoscope was passed under direct vision down the urethra which is noted to be entirely normal. The prostatic urethra revealed evidence of previous resection but had lateral lobe regrowth especially distally. No lesions were noted within the prostatic urethra. The bladder was then entered and fully and systematically inspected. There were some areas of mild erythema on the posterior wall. No papillary lesions were  identified. Ureteral orifices were of normal configuration and position.  Using a 6 [place] open-ended ureteral catheter which was passed through the cystoscope and into the right ureteral orifice I injected full-strength Omnipaque contrast under direct fluoroscopy up the right ureter. This revealed a normal-appearing right ureter with no filling defects, hydronephrosis or mass effect. The intrarenal collecting system also appeared normal. I therefore passed a 0.038 inch floppy-tipped guidewire through the open-ended catheter and up to the renal pelvis under fluoroscopy. The open-ended catheter was then passed over the guidewire up the right ureter into the area the renal pelvis and the guidewire was removed. I used normal saline to barbotaged the right renal pelvis and this was sent for cytology. I then turned my attention to the left ureteral orifice. It was cannulated in an identical fashion and a retrograde pyelogram was performed in an identical fashion as well. No abnormalities of the ureter are noted. Specifically there were no filling defects, mass effect or worrisome findings. The intrarenal collecting system also appeared entirely normal. Again barbotaged of the left renal pelvis was undertaken in an identical fashion to the contralateral side and sent for cytology.  I then used the [place] syringe and performed barbotage of the bladder and sent this for cytology. The cold cup biopsy forceps were then introduced and cold cup biopsies were obtained from the above noted locations. The Bugbee electrode was then inserted and each of the biopsy sites was fulgurated. At the end of the procedure there is no evidence of bleeding. I therefore drained the bladder, removed the cystoscope and the patient was awakened and taken to the recovery room in stable and satisfactory condition. He tolerated procedure well no  intraoperative complications.  PLAN OF CARE: Discharge to home after PACU  PATIENT DISPOSITION:   PACU - hemodynamically stable.

## 2015-01-21 NOTE — Anesthesia Postprocedure Evaluation (Signed)
  Anesthesia Post-op Note  Patient: [name]  Procedure(s) Performed: Procedure(s): CYSTOSCOPY/RETROGRADE/ BLADDER BIOPSY (Bilateral)  Patient Location: PACU  Anesthesia Type:General  Level of Consciousness: awake and alert   Airway and Oxygen Therapy: Patient Spontanous Breathing  Post-op Pain: Controlled  Post-op Assessment: Post-op Vital signs reviewed, Patient's Cardiovascular Status Stable and Respiratory Function Stable  Post-op Vital Signs: Reviewed  Filed Vitals:   01/21/15 1200  BP: 194/76  Pulse: 67  Temp:   Resp: 19    Complications: No apparent anesthesia complications

## 2015-01-21 NOTE — Anesthesia Procedure Notes (Signed)
Procedure Name: LMA Insertion Date/Time: 01/21/2015 10:27 AM Performed by: [place] Briceyda Abdullah C Pre-anesthesia Checklist: Patient identified, Emergency Drugs available, Suction available, Patient being monitored and Timeout performed Patient Re-evaluated:Patient Re-evaluated prior to inductionOxygen Delivery Method: Circle system utilized Preoxygenation: Pre-oxygenation with 100% oxygen Intubation Type: IV induction LMA: LMA inserted LMA Size: 4.0 Number of attempts: 1 Tube secured with: Tape Dental Injury: Teeth and Oropharynx as per pre-operative assessment
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
## 2015-01-21 NOTE — Discharge Instructions (Signed)

## 2015-01-21 NOTE — Anesthesia Preprocedure Evaluation (Signed)
Anesthesia Evaluation  Patient identified by MRN, date of birth, ID band Patient awake    Reviewed: Allergy & Precautions, H&P , NPO status , Patient's Chart, lab work & pertinent test results  Airway Mallampati: II  TM Distance: >3 FB Neck ROM: Full    Dental no notable dental hx. (+) Teeth Intact, Dental Advisory Given   Pulmonary neg pulmonary ROS,  breath sounds clear to auscultation  Pulmonary exam normal       Cardiovascular hypertension, Pt. on medications + CAD + dysrhythmias Atrial Fibrillation + pacemaker + Valvular Problems/Murmurs AS Rhythm:Regular Rate:Normal     Neuro/Psych TIACVA, No Residual Symptoms negative psych ROS   GI/Hepatic Neg liver ROS, GERD-  Medicated and Controlled,  Endo/Other  diabetes  Renal/GU Renal disease  negative genitourinary   Musculoskeletal  (+) Arthritis -, Osteoarthritis,    Abdominal   Peds  Hematology negative hematology ROS (+)   Anesthesia Other Findings   Reproductive/Obstetrics negative OB ROS                             Anesthesia Physical Anesthesia Plan  ASA: III  Anesthesia Plan: General   Post-op Pain Management:    Induction: Intravenous  Airway Management Planned: LMA  Additional Equipment:   Intra-op Plan:   Post-operative Plan: Extubation in OR  Informed Consent: I have reviewed the patients History and Physical, chart, labs and discussed the procedure including the risks, benefits and alternatives for the proposed anesthesia with the patient or authorized representative who has indicated his/her understanding and acceptance.   Dental advisory given  Plan Discussed with: CRNA  Anesthesia Plan Comments:         Anesthesia Quick Evaluation

## 2015-01-21 NOTE — Progress Notes (Signed)
Dr. Ola Spurr aware of BP 194/76, meds given and he has not had Altace today. No new orders. OK to go to Short Stay.

## 2015-01-22 ENCOUNTER — Encounter (HOSPITAL_COMMUNITY): Payer: Self-pay | Admitting: Urology

## 2015-01-29 ENCOUNTER — Other Ambulatory Visit: Payer: Self-pay | Admitting: Urology

## 2015-02-11 NOTE — Patient Instructions (Addendum)
YOUR PROCEDURE IS SCHEDULED ON : 02/15/15  REPORT TO [facility] MAIN ENTRANCE FOLLOW SIGNS TO EAST ELEVATOR - GO TO 3rd FLOOR CHECK IN AT 3 EAST NURSES STATION (SHORT STAY) AT: 9:30 AM  CALL THIS NUMBER IF YOU HAVE PROBLEMS THE MORNING OF SURGERY [phone]  REMEMBER:ONLY 1 PER PERSON MAY GO TO SHORT STAY WITH YOU TO GET READY THE MORNING OF YOUR SURGERY  DO NOT EAT FOOD OR DRINK LIQUIDS AFTER MIDNIGHT  TAKE THESE MEDICINES THE MORNING OF SURGERY: PROTONIX / MAY TAKE HYDROCODONE IF NEEDED  YOU MAY NOT HAVE ANY METAL ON YOUR BODY INCLUDING HAIR PINS AND PIERCING'S. DO NOT WEAR JEWELRY, MAKEUP, LOTIONS, POWDERS OR PERFUMES. DO NOT WEAR NAIL POLISH. DO NOT SHAVE 48 HRS PRIOR TO SURGERY. MEN MAY SHAVE FACE AND NECK.  DO NOT [place]. [place] IS NOT RESPONSIBLE FOR VALUABLES.  CONTACTS, DENTURES OR PARTIALS MAY NOT BE WORN TO SURGERY. LEAVE SUITCASE IN CAR. CAN BE BROUGHT TO ROOM AFTER SURGERY.  PATIENTS DISCHARGED THE DAY OF SURGERY WILL NOT BE ALLOWED TO DRIVE HOME.  PLEASE READ OVER THE FOLLOWING INSTRUCTION SHEETS _________________________________________________________________________________                                           - PREPARING FOR SURGERY  Before surgery, you can play an important role.  Because skin is not sterile, your skin needs to be as free of germs as possible.  You can reduce the number of germs on your skin by washing with CHG (chlorahexidine gluconate) soap before surgery.  CHG is an antiseptic cleaner which kills germs and bonds with the skin to continue killing germs even after washing. Please DO NOT use if you have an allergy to CHG or antibacterial soaps.  If your skin becomes reddened/irritated stop using the CHG and inform your nurse when you arrive at Short Stay. Do not shave (including legs and underarms) for at least 48 hours prior to the first CHG shower.  You may shave your face. Please  follow these instructions carefully:   1.  Shower with CHG Soap the night before surgery and the  morning of Surgery.   2.  If you choose to wash your hair, wash your hair first as usual with your  normal  Shampoo.   3.  After you shampoo, rinse your hair and body thoroughly to remove the  shampoo.                                         4.  Use CHG as you would any other liquid soap.  You can apply chg directly  to the skin and wash . Gently wash with scrungie or clean wascloth    5.  Apply the CHG Soap to your body ONLY FROM THE NECK DOWN.   Do not use on open                           Wound or open sores. Avoid contact with eyes, ears mouth and genitals (private parts).                        Genitals (private parts) with your  normal soap.              6.  Wash thoroughly, paying special attention to the area where your surgery  will be performed.   7.  Thoroughly rinse your body with warm water from the neck down.   8.  DO NOT shower/wash with your normal soap after using and rinsing off  the CHG Soap .                9.  Pat yourself dry with a clean towel.             10.  Wear clean night clothes to bed after shower             11.  Place clean sheets on your bed the night of your first shower and do not  sleep with pets.  Day of Surgery : Do not apply any lotions/deodorants the morning of surgery.  Please wear clean clothes to the hospital/surgery center.  FAILURE TO FOLLOW THESE INSTRUCTIONS MAY RESULT IN THE CANCELLATION OF YOUR SURGERY    PATIENT SIGNATURE_________________________________  ______________________________________________________________________

## 2015-02-12 ENCOUNTER — Encounter (HOSPITAL_COMMUNITY)
Admission: RE | Admit: 2015-02-12 | Discharge: 2015-02-12 | Disposition: A | Discharge status: Home / Self Care | Payer: Medicare Other | Source: Ambulatory Visit | Attending: Urology | Admitting: Urology

## 2015-02-12 ENCOUNTER — Encounter (HOSPITAL_COMMUNITY): Payer: Self-pay

## 2015-02-12 DIAGNOSIS — I10 Essential (primary) hypertension: Secondary | ICD-10-CM | POA: Diagnosis not present

## 2015-02-12 DIAGNOSIS — E119 Type 2 diabetes mellitus without complications: Secondary | ICD-10-CM | POA: Diagnosis not present

## 2015-02-12 DIAGNOSIS — Z823 Family history of stroke: Secondary | ICD-10-CM | POA: Diagnosis not present

## 2015-02-12 DIAGNOSIS — E780 Pure hypercholesterolemia: Secondary | ICD-10-CM | POA: Diagnosis not present

## 2015-02-12 DIAGNOSIS — Z885 Allergy status to narcotic agent status: Secondary | ICD-10-CM | POA: Diagnosis not present

## 2015-02-12 DIAGNOSIS — I4891 Unspecified atrial fibrillation: Secondary | ICD-10-CM | POA: Diagnosis not present

## 2015-02-12 DIAGNOSIS — Z85828 Personal history of other malignant neoplasm of skin: Secondary | ICD-10-CM | POA: Diagnosis not present

## 2015-02-12 DIAGNOSIS — C679 Malignant neoplasm of bladder, unspecified: Secondary | ICD-10-CM | POA: Diagnosis not present

## 2015-02-12 DIAGNOSIS — C641 Malignant neoplasm of right kidney, except renal pelvis: Secondary | ICD-10-CM | POA: Diagnosis not present

## 2015-02-12 DIAGNOSIS — Z881 Allergy status to other antibiotic agents status: Secondary | ICD-10-CM | POA: Diagnosis not present

## 2015-02-12 DIAGNOSIS — Z8551 Personal history of malignant neoplasm of bladder: Secondary | ICD-10-CM | POA: Diagnosis present

## 2015-02-12 DIAGNOSIS — Z79899 Other long term (current) drug therapy: Secondary | ICD-10-CM | POA: Diagnosis not present

## 2015-02-12 DIAGNOSIS — Z801 Family history of malignant neoplasm of trachea, bronchus and lung: Secondary | ICD-10-CM | POA: Diagnosis not present

## 2015-02-12 DIAGNOSIS — I495 Sick sinus syndrome: Secondary | ICD-10-CM | POA: Diagnosis not present

## 2015-02-12 DIAGNOSIS — Z7902 Long term (current) use of antithrombotics/antiplatelets: Secondary | ICD-10-CM | POA: Diagnosis not present

## 2015-02-12 DIAGNOSIS — Z8673 Personal history of transient ischemic attack (TIA), and cerebral infarction without residual deficits: Secondary | ICD-10-CM | POA: Diagnosis not present

## 2015-02-12 DIAGNOSIS — R310 Gross hematuria: Secondary | ICD-10-CM | POA: Diagnosis not present

## 2015-02-12 DIAGNOSIS — N400 Enlarged prostate without lower urinary tract symptoms: Secondary | ICD-10-CM | POA: Diagnosis not present

## 2015-02-12 HISTORY — DX: Unspecified malignant neoplasm of skin, unspecified: C44.90

## 2015-02-12 LAB — BASIC METABOLIC PANEL
Anion gap: 10 (ref 5–15)
BUN: 11 mg/dL (ref 6–20)
CHLORIDE: 101 mmol/L (ref 101–111)
CO2: 26 mmol/L (ref 22–32)
Calcium: 9 mg/dL (ref 8.9–10.3)
Creatinine, Ser: 0.88 mg/dL (ref 0.61–1.24)
GFR calc non Af Amer: >60 mL/min (ref >60)
Glucose, Bld: 100 mg/dL — ABNORMAL HIGH (ref 65–99)
Potassium: 4.5 mmol/L (ref 3.5–5.1)
Sodium: 137 mmol/L (ref 135–145)

## 2015-02-12 LAB — CBC
HCT: 41.9 % (ref 39.0–52.0)
HEMOGLOBIN: 14.2 g/dL (ref 13.0–17.0)
MCH: 34.6 pg — AB (ref 26.0–34.0)
MCHC: 33.9 g/dL (ref 30.0–36.0)
MCV: 102.2 fL — ABNORMAL HIGH (ref 78.0–100.0)
Platelets: 189 10*3/uL (ref 150–400)
RBC: 4.1 MIL/uL — AB (ref 4.22–5.81)
RDW: 13 % (ref 11.5–15.5)
WBC: 6.7 10*3/uL (ref 4.0–10.5)

## 2015-02-12 NOTE — Progress Notes (Signed)
   02/12/15 1151  OBSTRUCTIVE SLEEP APNEA  Have you ever been diagnosed with sleep apnea through a sleep study? No  Do you snore loudly (loud enough to be heard through closed doors)?  1  Do you often feel tired, fatigued, or sleepy during the daytime? 1  Has anyone observed you stop breathing during your sleep? 0  Do you have, or are you being treated for high blood pressure? 1  BMI more than 35 kg/m2? 0  Age over 79 years old? 1  Neck circumference greater than 40 cm/16 inches? 1  Gender: 1

## 2015-02-15 ENCOUNTER — Encounter (HOSPITAL_COMMUNITY)
Admission: RE | Disposition: A | Discharge status: Home / Self Care | Payer: Self-pay | Source: Ambulatory Visit | Attending: Urology

## 2015-02-15 ENCOUNTER — Ambulatory Visit (HOSPITAL_COMMUNITY): Payer: Medicare Other | Admitting: Certified Registered Nurse Anesthetist

## 2015-02-15 ENCOUNTER — Encounter (HOSPITAL_COMMUNITY): Payer: Self-pay | Admitting: *Deleted

## 2015-02-15 ENCOUNTER — Ambulatory Visit (HOSPITAL_COMMUNITY)
Admission: RE | Admit: 2015-02-15 | Discharge: 2015-02-15 | Disposition: A | Discharge status: Home / Self Care | Payer: Medicare Other | Source: Ambulatory Visit | Attending: Urology | Admitting: Urology

## 2015-02-15 DIAGNOSIS — I10 Essential (primary) hypertension: Secondary | ICD-10-CM | POA: Diagnosis not present

## 2015-02-15 DIAGNOSIS — C679 Malignant neoplasm of bladder, unspecified: Secondary | ICD-10-CM | POA: Insufficient documentation

## 2015-02-15 DIAGNOSIS — C641 Malignant neoplasm of right kidney, except renal pelvis: Secondary | ICD-10-CM | POA: Insufficient documentation

## 2015-02-15 DIAGNOSIS — Z885 Allergy status to narcotic agent status: Secondary | ICD-10-CM | POA: Insufficient documentation

## 2015-02-15 DIAGNOSIS — I4891 Unspecified atrial fibrillation: Secondary | ICD-10-CM | POA: Insufficient documentation

## 2015-02-15 DIAGNOSIS — Z8673 Personal history of transient ischemic attack (TIA), and cerebral infarction without residual deficits: Secondary | ICD-10-CM | POA: Insufficient documentation

## 2015-02-15 DIAGNOSIS — Z8551 Personal history of malignant neoplasm of bladder: Secondary | ICD-10-CM | POA: Insufficient documentation

## 2015-02-15 DIAGNOSIS — C801 Malignant (primary) neoplasm, unspecified: Secondary | ICD-10-CM

## 2015-02-15 DIAGNOSIS — R310 Gross hematuria: Secondary | ICD-10-CM | POA: Insufficient documentation

## 2015-02-15 DIAGNOSIS — Z85828 Personal history of other malignant neoplasm of skin: Secondary | ICD-10-CM | POA: Insufficient documentation

## 2015-02-15 DIAGNOSIS — I495 Sick sinus syndrome: Secondary | ICD-10-CM | POA: Insufficient documentation

## 2015-02-15 DIAGNOSIS — Z7902 Long term (current) use of antithrombotics/antiplatelets: Secondary | ICD-10-CM | POA: Insufficient documentation

## 2015-02-15 DIAGNOSIS — E119 Type 2 diabetes mellitus without complications: Secondary | ICD-10-CM | POA: Insufficient documentation

## 2015-02-15 DIAGNOSIS — Z881 Allergy status to other antibiotic agents status: Secondary | ICD-10-CM | POA: Insufficient documentation

## 2015-02-15 DIAGNOSIS — Z801 Family history of malignant neoplasm of trachea, bronchus and lung: Secondary | ICD-10-CM | POA: Insufficient documentation

## 2015-02-15 DIAGNOSIS — E780 Pure hypercholesterolemia: Secondary | ICD-10-CM | POA: Insufficient documentation

## 2015-02-15 DIAGNOSIS — N400 Enlarged prostate without lower urinary tract symptoms: Secondary | ICD-10-CM | POA: Insufficient documentation

## 2015-02-15 DIAGNOSIS — Z79899 Other long term (current) drug therapy: Secondary | ICD-10-CM | POA: Insufficient documentation

## 2015-02-15 DIAGNOSIS — Z823 Family history of stroke: Secondary | ICD-10-CM | POA: Insufficient documentation

## 2015-02-15 HISTORY — PX: CYSTOSCOPY WITH URETEROSCOPY AND STENT PLACEMENT: SHX6377

## 2015-02-15 LAB — GLUCOSE, CAPILLARY
Glucose-Capillary: 106 mg/dL — ABNORMAL HIGH (ref 65–99)
Glucose-Capillary: 102 mg/dL — ABNORMAL HIGH (ref 65–99)
Glucose-Capillary: 101 mg/dL — ABNORMAL HIGH (ref 65–99)

## 2015-02-15 SURGERY — CYSTOURETEROSCOPY, WITH STENT INSERTION
Anesthesia: General | Laterality: Right

## 2015-02-15 MED ORDER — FENTANYL CITRATE (PF) 100 MCG/2ML IJ SOLN
INTRAMUSCULAR | Status: AC
  Filled 2015-02-15: qty 2

## 2015-02-15 MED ORDER — FENTANYL CITRATE (PF) 100 MCG/2ML IJ SOLN
25.0000 ug | INTRAMUSCULAR | Status: AC | PRN
  Administered 2015-02-15 (×6): 50 ug via INTRAVENOUS

## 2015-02-15 MED ORDER — LIDOCAINE HCL (CARDIAC) 20 MG/ML IV SOLN
INTRAVENOUS | Status: AC
  Filled 2015-02-15: qty 5

## 2015-02-15 MED ORDER — PHENYLEPHRINE 40 MCG/ML (10ML) SYRINGE FOR IV PUSH (FOR BLOOD PRESSURE SUPPORT)
PREFILLED_SYRINGE | INTRAVENOUS | Status: AC
  Filled 2015-02-15: qty 10

## 2015-02-15 MED ORDER — LIDOCAINE HCL (CARDIAC) 20 MG/ML IV SOLN
INTRAVENOUS | Status: DC | PRN
  Administered 2015-02-15: 50 mg via INTRAVENOUS

## 2015-02-15 MED ORDER — SODIUM CHLORIDE 0.9 % IR SOLN
Status: DC | PRN
  Administered 2015-02-15: 6000 mL via INTRAVESICAL

## 2015-02-15 MED ORDER — ONDANSETRON HCL 4 MG/2ML IJ SOLN
INTRAMUSCULAR | Status: AC
  Filled 2015-02-15: qty 2

## 2015-02-15 MED ORDER — FENTANYL CITRATE (PF) 100 MCG/2ML IJ SOLN
INTRAMUSCULAR | Status: DC | PRN
  Administered 2015-02-15: 12.5 ug via INTRAVENOUS
  Administered 2015-02-15: 50 ug via INTRAVENOUS
  Administered 2015-02-15: 25 ug via INTRAVENOUS
  Administered 2015-02-15: 12.5 ug via INTRAVENOUS
  Administered 2015-02-15: 50 ug via INTRAVENOUS
  Administered 2015-02-15 (×2): 25 ug via INTRAVENOUS

## 2015-02-15 MED ORDER — PROPOFOL 10 MG/ML IV BOLUS
INTRAVENOUS | Status: AC
Start: 2015-02-15 — End: 2015-02-15
  Filled 2015-02-15: qty 20

## 2015-02-15 MED ORDER — FENTANYL CITRATE (PF) 100 MCG/2ML IJ SOLN
INTRAMUSCULAR | Status: AC
  Filled 2015-02-15: qty 4

## 2015-02-15 MED ORDER — LACTATED RINGERS IV SOLN: INTRAVENOUS | Status: DC

## 2015-02-15 MED ORDER — PHENYLEPHRINE HCL 10 MG/ML IJ SOLN
INTRAMUSCULAR | Status: DC | PRN
  Administered 2015-02-15: 80 ug via INTRAVENOUS
  Administered 2015-02-15: 40 ug via INTRAVENOUS

## 2015-02-15 MED ORDER — HYDROCODONE-ACETAMINOPHEN 10-325 MG PO TABS
1.0000 | ORAL_TABLET | ORAL | Status: DC | PRN
  Administered 2015-02-15: 1 via ORAL
  Filled 2015-02-15: qty 2

## 2015-02-15 MED ORDER — ONDANSETRON HCL 4 MG/2ML IJ SOLN
INTRAMUSCULAR | Status: DC | PRN
  Administered 2015-02-15 (×2): 2 mg via INTRAVENOUS

## 2015-02-15 MED ORDER — PHENAZOPYRIDINE HCL 200 MG PO TABS: 200.0000 mg | ORAL_TABLET | Freq: Three times a day (TID) | ORAL | Status: DC | PRN

## 2015-02-15 MED ORDER — LACTATED RINGERS IV SOLN
INTRAVENOUS | Status: DC | PRN
  Administered 2015-02-15: 11:00:00 via INTRAVENOUS

## 2015-02-15 MED ORDER — IOHEXOL 300 MG/ML  SOLN
INTRAMUSCULAR | Status: DC | PRN
  Administered 2015-02-15: 10 mL via URETHRAL

## 2015-02-15 MED ORDER — HYDROCODONE-ACETAMINOPHEN 10-325 MG PO TABS: 1.0000 | ORAL_TABLET | ORAL | Status: DC | PRN

## 2015-02-15 MED ORDER — CIPROFLOXACIN IN D5W 200 MG/100ML IV SOLN
200.0000 mg | INTRAVENOUS | Status: AC
  Administered 2015-02-15: 200 mg via INTRAVENOUS
  Filled 2015-02-15: qty 100

## 2015-02-15 MED ORDER — PROPOFOL 10 MG/ML IV BOLUS
INTRAVENOUS | Status: DC | PRN
Start: 2015-02-15 — End: 2015-02-15
  Administered 2015-02-15: 200 mg via INTRAVENOUS

## 2015-02-15 SURGICAL SUPPLY — 22 items
BAG URO CATCHER STRL LF (DRAPE) ×3 IMPLANT
BASKET ZERO TIP NITINOL 2.4FR (BASKET) ×2 IMPLANT
BRUSH URET BIOPSY 3F (UROLOGICAL SUPPLIES) ×2 IMPLANT
BSKT STON RTRVL ZERO TP 2.4FR (BASKET) ×2
CATH INTERMIT  6FR 70CM (CATHETERS) ×3 IMPLANT
CLOTH BEACON ORANGE TIMEOUT ST (SAFETY) ×3 IMPLANT
FIBER LASER FLEXIVA 1000 (UROLOGICAL SUPPLIES) IMPLANT
FIBER LASER FLEXIVA 200 (UROLOGICAL SUPPLIES) IMPLANT
FIBER LASER FLEXIVA 365 (UROLOGICAL SUPPLIES) IMPLANT
FIBER LASER FLEXIVA 550 (UROLOGICAL SUPPLIES) IMPLANT
FIBER LASER TRAC TIP (UROLOGICAL SUPPLIES) IMPLANT
GLOVE BIOGEL M 8.0 STRL (GLOVE) ×7 IMPLANT
GOWN STRL REUS W/ TWL XL LVL3 (GOWN DISPOSABLE) ×1 IMPLANT
GOWN STRL REUS W/TWL XL LVL3 (GOWN DISPOSABLE) ×5 IMPLANT
GUIDEWIRE ANG ZIPWIRE 038X150 (WIRE) IMPLANT
GUIDEWIRE STR DUAL SENSOR (WIRE) ×2 IMPLANT
MANIFOLD NEPTUNE II (INSTRUMENTS) ×3 IMPLANT
PACK CYSTO (CUSTOM PROCEDURE TRAY) ×3 IMPLANT
SHEATH ACCESS URETERAL 38CM (SHEATH) ×2 IMPLANT
STENT URET 6FRX24 CONTOUR (STENTS) ×2 IMPLANT
SYRINGE IRR TOOMEY STRL 70CC (SYRINGE) ×2 IMPLANT
TUBING CONNECTING 10 (TUBING) ×3 IMPLANT

## 2015-02-15 NOTE — Anesthesia Preprocedure Evaluation (Signed)
Anesthesia Evaluation  Patient identified by MRN, date of birth, ID band Patient awake    Reviewed: Allergy & Precautions, H&P , NPO status , Patient's Chart, lab work & pertinent test results  Airway Mallampati: II  TM Distance: >3 FB Neck ROM: full    Dental  (+) Dental Advisory Given, Chipped Left upper front chipped:   Pulmonary neg pulmonary ROS,  breath sounds clear to auscultation  Pulmonary exam normal       Cardiovascular Exercise Tolerance: Good hypertension, Pt. on medications +CHF negative cardio ROS Normal cardiovascular exam+ dysrhythmias Atrial Fibrillation + pacemaker + Valvular Problems/Murmurs AS Rhythm:regular Rate:Normal + Systolic murmurs Acute pulmonary edema   Neuro/Psych TIACVA, No Residual Symptoms negative neurological ROS  negative psych ROS   GI/Hepatic negative GI ROS, Neg liver ROS, GERD-  Controlled,  Endo/Other  negative endocrine ROSdiabetes, Well Controlled, Type 47meds d/c'd 1 year ago  Renal/GU negative Renal ROS  negative genitourinary   Musculoskeletal   Abdominal   Peds  Hematology negative hematology ROS (+)   Anesthesia Other Findings   Reproductive/Obstetrics negative OB ROS                             Anesthesia Physical Anesthesia Plan  ASA: III  Anesthesia Plan: General   Post-op Pain Management:    Induction: Intravenous  Airway Management Planned: LMA  Additional Equipment:   Intra-op Plan:   Post-operative Plan:   Informed Consent:   Plan Discussed with: Surgeon  Anesthesia Plan Comments:         Anesthesia Quick Evaluation

## 2015-02-15 NOTE — Anesthesia Postprocedure Evaluation (Signed)
  Anesthesia Post-op Note  Patient: [name]  Procedure(s) Performed: Procedure(s) (LRB): CYSTOSCOPY WITH RIGHT URETEROSCOPY, RENAL PELVIC WASHINGS, STENT, RETROGRADE (Right)  Patient Location: PACU  Anesthesia Type: General  Level of Consciousness: awake and alert   Airway and Oxygen Therapy: Patient Spontanous Breathing  Post-op Pain: mild  Post-op Assessment: Post-op Vital signs reviewed, Patient's Cardiovascular Status Stable, Respiratory Function Stable, Patent Airway and No signs of Nausea or vomiting  Last Vitals:  Filed Vitals:   02/15/15 1447  BP: 185/87  Pulse: 90  Temp: 37.3 C  Resp:     Post-op Vital Signs: stable   Complications: No apparent anesthesia complications

## 2015-02-15 NOTE — H&P (Signed)
[name] is a 81 year old male who returns status post evaluation of a positive cytology.   History of Present Illness     TCCa/CIS of the bladder: After an episode of gross hematuria cystoscopy was performed and revealed a bladder tumor. 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   On 06/18/08 he underwent re-resection of the prostate and bladder Bx which showed no evidence of malignancy in any of the prostate specimen but was pos. for high grade CIS in the bladder (left wall).   He underwent a 6 week course of intravesical BCG therapy which he completed in 4/10.   On 11/02/08 repeat cystoscopy with random bladder biopsies revealed no evidence of CIS or papillary tumor.   He was then found to have a positive cytology despite negative cystoscopy in 6/12. He therefore underwent barbotage of both right and left renal pelvis as well as the bladder including retrograde pyelography. No abnormality was noted of the upper tracts on retrograde however there were atypical cells noted from the barbotage of the left renal pelvis and CIS was again identified on the posterior wall of the bladder at the time of biopsy.  He therefore underwent a second 6 week induction course of BCG.     He was found to have no abnormality of the bladder cystoscopically in 8/13 but a cytology was positive for malignant cells. He therefore underwent evaluation with cystoscopy, bilateral retrograde pyelography, bilateral renal washings, bladder barbotage and bladder biopsy on 04/01/12.  Bladder barbotage - slight atypia felt to represent reactive.  Bladder biopsy - chronic inflammation without dysplasia or malignancy on all biopsies.  Left renal pelvis barbotage - atypia suspicious for malignancy.  Right renal pelvis barbotage - marked atypia suspicious for  malignancy.    Repeat evaluation on 05/02/12:  Bladder barbotage and biopsy - inflammation but no malignancy on bladder biopsy. I also biopsied the prostatic urethra it was negative for malignancy. Cytology was negative.  Right renal pelvis and ureter brush biopsy and barbotage - atypia not definitely diagnostic of malignancy.  Left renal pelvis and ureter brush biopsy and barbotage - atypia not definitely diagnostic of malignancy.    2/14 FISH (+)   3rd 6 week course of BCG 1/3 strength plus Interon A.    Gross Hematuria: Evaluation with CT scan 12/14 - negative. Cysto - negative.  Treatment: Finasteride    Elevated PSA: He has been biopsied twice because of that. In 09/1994, his PSA was 7.4. Biopsy revealed BPH and chronic prostatitis. I followed it along and then biopsied him again in 2000 and that showed BPH with chronic prostatitis only. His PSA has fluctuated over the years, but remained relatively stable.      Organic erectile dysfunction: He wanted to try a vacuum erection device. He has received information about this device and asked if I thought it might be helpful for him so I gave him a prescription for the device.      Interval history: When I saw him in 6/16 he was having irritative voiding symptoms and bladder pain as well as described the passage of material in the urine and possibly gross hematuria but I found no definite abnormality cystoscopically. His cytology returned positive for malignant cells and therefore he was taken to the operating room where bilateral retrograde pyelograms revealed no abnormality of the upper tract. I obtained  barbotage specimens from the right and left renal pelvis, bladder and obtained bladder biopsies. He said he was doing well and his urine had cleared up and then became bloody again today. He is not having any flank pain.         Past Medical History Problems  1. History of Acute cystitis without hematuria (N30.00) 2.  History of Arthritis 3. History of Atrial fibrillation (I48.91) 4. History of Benign prostatic hyperplasia with urinary obstruction (N40.1,N13.8) 5. 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 History of Cardiac Devices Pacemaker Present 7. History of Chronic Subdural Hematoma 8. History of Gross hematuria (R31.0) 9. History of bladder cancer (Z85.51) 10. History of cholelithiasis (Z87.19) 11. History of diabetes mellitus (Z86.39) 12. History of hypercholesterolemia (Z86.39) 13. History of hypertension (Z86.79) 14. History of Ischemic Stroke 15. History of Sick Sinus Syndrome 16. History of Skin Cancer 17. History of Taking High-risk Medication 18. History of Urinary retention (R33.9) 19. History of Visit For: Exam Following High-risk Medication  Surgical History Problems  1. History of Biopsy Of The Prostate Needle 2. History of Biopsy Of The Prostate Needle 3. History of Blood Vessel Repair 4. History of Craniotomy Supratentorial Subdural Hemorrhage Removal 5. History of Cystoscopy With Biopsy 6. History of Cystoscopy With Biopsy 7. History of Cystoscopy With Biopsy 8. History of Cystoscopy With Biopsy 9. History of Cystoscopy With Fulguration Minor Lesion (Under 63mm) 10. History of Cystoscopy With Fulguration Small Lesion (5-38mm) 11. History of Cystoscopy With Insertion Of Ureteral Stent Bilateral 12. History of Cystoscopy With Ureteroscopy For Biopsy Bilateral 13. History of Pacemaker - Pulse Generator Replacement 14. History of Surgery Excision Of Parotid Tumor/Gland 15. History of Transurethral Incision Of Prostate 16. History of Transurethral Resection Of Prostate (TURP)  Current Meds 1. Aleve TABS;  Therapy: (Recorded:10Nov2014) to Recorded 2. Digoxin 250 MCG Oral Tablet;  Therapy: (Recorded:01Mar2012) to Recorded 3. Finasteride 5 MG Oral Tablet; Take 1 tablet by mouth every day;  Therapy: [id number] to (Evaluate:24Jan2016)  Requested for: 29Jan2015; Last   Rx:29Jan2015 Ordered 4. Hydrocodone-Acetaminophen 5-325 MG Oral Tablet; TAKE 1 TO 2 TABLETS BY MOUTH  EVERY 4 HOURS AS NEEDED FOR PAIN;  Therapy: [id number] to (Last Rx:12Jul2016) Ordered 5. Lipitor 40 MG Oral Tablet;  Therapy: (Recorded:15Nov2007) to Recorded 6. Plavix TABS;  Therapy: (Recorded:10Nov2014) to Recorded 7. Protonix 40 MG Oral Tablet Delayed Release;  Therapy: (Recorded:15Nov2007) to Recorded 8. Ramipril 1.25 MG Oral Capsule;  Therapy: [id number] to Recorded 9. Toprol XL 25 MG Oral Tablet Extended Release 24 Hour;  Therapy: (Recorded:31May2011) to Recorded  Allergies Medication  1. Codeine Derivatives 2. Doxy-Caps CAPS  Family History Problems  1. Family history of Family Health Status - Father's Age   62-lung cancer 2. Family history of Family Health Status - Mother's Age   68-stroke 3. Family history of Family Health Status Number Of Children   1 son, 2 daughters 44. No pertinent family history : Mother  Social History Problems  1. Alcohol Use   4 drinks/day 2. Caffeine Use   1/day 3. Family history of Death In The Family Father   deceased age 33 4. Family history of Death In The Family Mother   deceased age 58 5. Marital History - Widowed 6. Never A Smoker 7. Occupation:   retired 87. Denied: History of Tobacco Use  Review of Systems Genitourinary and gastrointestinal system(s) were reviewed and pertinent findings if present are noted and are otherwise negative.  Genitourinary: dysuria, hematuria, erectile dysfunction and penile pain.  Gastrointestinal: no flank  pain.  Constitutional: no fever.    Vitals Vital Signs Height: 5 ft 10 in Weight: 185 lb  BMI Calculated: 26.54 BSA Calculated: 2.02 Blood Pressure: 179 / 79 Temperature: 97.7 F Heart Rate: 81   Review of Systems  13 point review of systems was negative other than as noted below Genitourinary, constitutional and gastrointestinal system(s) were reviewed and pertinent  findings if present are noted.  Genitourinary: erectile dysfunction, but no hematuria.  Gastrointestinal: no flank pain.  Constitutional: no fever.    Physical Exam:  General appearance: alert and appears stated age  Head: Normocephalic, without obvious abnormality, atraumatic  Eyes: conjunctivae/corneas clear. EOM's intact.  Oropharynx: moist mucous membranes  Neck: supple, symmetrical, trachea midline  Resp: normal respiratory effort  Cardio: regular rate and rhythm  Back: symmetric, no curvature. ROM normal. No CVA tenderness.  GI: soft, non-tender; bowel sounds normal; no masses, no organomegaly  Male genitalia: penis: normal male phallus, uncirc. with no lesions or discharge.  Testes: bilaterally descended with no masses or tenderness. no hernias  Extremities: extremities normal, atraumatic, no cyanosis or edema  Skin: Skin color normal. No visible rashes or lesions  Neurologic: Grossly normal    Assessment   I went over the results of my evaluation of his urinary tract which revealed no definite lesions within the bladder with the bladder barbotage revealing atypical cells that were suspicious but all sites that were biopsied revealed inflammation only in his bladder and prostatic urethra. The left renal barbotage was negative but the right side was positive for high-grade urothelial malignancy.    He has undergone 2 induction courses of BCG for his bladder cancer and one possible treatment could be the placement of a stent within the kidney and treatment with BCG with the intent of refluxing the BCG into the renal pelvis. I'm somewhat concerned about performing this however due to the fact that I think it would be difficult to get the BCG up into his renal pelvis reliably. If this was to be considered since his renal pelvis has not been visualized I would recommend ureteroscopy with the hope that I might find a small papillary lesion that could be treated with  local therapy such as laser. The other option is obviously to proceed with a right nephroureterectomy.    He continues to have intermittent gross hematuria. It may be due to the previous biopsies but he was having this before I did in the operating room. He has some abnormality of his prostatic urethra which I'll take a look at again but we discussed today proceeding with right ureteroscopy, possible biopsy, possible laser fulguration and stent.    Plan  He will be scheduled for right ureteroscopy.

## 2015-02-15 NOTE — Discharge Instructions (Signed)
Post stent placement instructions   Definitions:  Ureter: The duct that transports urine from the kidney to the bladder. Stent: A plastic hollow tube that is placed into the ureter, from the kidney to the bladder to prevent the ureter from swelling shut.  General instructions:  Despite the fact that no skin incisions were used, the area around the ureter and bladder is raw and irritated. The stent is a foreign body which can further irritate the bladder wall. This irritation is manifested by increased frequency of urination, both day and night, and by an increase in the urge to urinate. In some, the urge to urinate is present almost always. Sometimes the urge is strong enough that you may not be able to stop your self from urinating. This can often be controlled with medication but does not occur in everyone. A stent can safely be left in place for 3 months or greater.  You may see some blood in your urine while the stent is in place and a few days afterward. Do not be alarmed, even if the urine is clear for a while. Get off your feet and drink lots of fluids until clearing occurs. If you start to pass clots or don't improve, call us.  Diet:  You may return to your normal diet immediately. Because of the raw surface of your bladder, alcohol, spicy foods, foods high in acid and drinks with caffeine may cause irritation or frequency and should be used in moderation. To keep your urine flowing freely and avoid constipation, drink plenty of fluids during the day (8-10 glasses). Tip: Avoid cranberry juice because it is very acidic.  Activity:  Your physical activity doesn't need to be restricted. However, if you are very active, you may see some blood in the urine. We suggest that you reduce your activity under the circumstances until the bleeding has stopped.  Bowels:  It is important to keep your bowels regular during the postoperative period. Straining with bowel movements can cause bleeding. A  bowel movement every other day is reasonable. Use a mild laxative if needed, such as milk of magnesia 2-3 tablespoons, or 2 Dulcolax tablets. Call if you continue to have problems. If you had been taking narcotics for pain, before, during or after your surgery, you may be constipated. Take a laxative if necessary.  Medication:  You should resume your pre-surgery medications unless told not to. In addition you may be given an antibiotic to prevent or treat infection. Antibiotics are not always necessary. All medication should be taken as prescribed until the bottles are finished unless you are having an unusual reaction to one of the drugs.  Problems you should report to [place]:  a. Fever greater than 101F. b. Heavy bleeding, or clots (see notes above about blood in urine). c. Inability to urinate. d. Drug reactions (hives, rash, nausea, vomiting, diarrhea). e. Severe burning or pain with urination that is not improving.      General Anesthesia, Care After Refer to this sheet in the next few weeks. These instructions provide you with information on caring for yourself after your procedure. Your health care provider may also give you more specific instructions. Your treatment has been planned according to current medical practices, but problems sometimes occur. Call your health care provider if you have any problems or questions after your procedure. WHAT TO EXPECT AFTER THE PROCEDURE After the procedure, it is typical to experience:  Sleepiness.  Nausea and vomiting. HOME CARE INSTRUCTIONS  For the first  24 hours after general anesthesia:  Have a responsible person with you.  Do not drive a car. If you are alone, do not take public transportation.  Do not drink alcohol.  Do not take medicine that has not been prescribed by your health care provider.  Do not sign important papers or make important decisions.  You may resume a normal diet and activities as directed by your health  care provider.  Change bandages (dressings) as directed.  If you have questions or problems that seem related to general anesthesia, call the hospital and ask for the anesthetist or anesthesiologist on call. SEEK MEDICAL CARE IF:  You have nausea and vomiting that continue the day after anesthesia.  You develop a rash. SEEK IMMEDIATE MEDICAL CARE IF:   You have difficulty breathing.  You have chest pain.  You have any allergic problems. Document Released: 09/14/2000 Document Revised: 06/13/2013 Document Reviewed: 12/22/2012 [facility] Patient Information 2015 [place], [place]. This information is not intended to replace advice given to you by your health care provider. Make sure you discuss any questions you have with your health care provider.

## 2015-02-15 NOTE — Progress Notes (Signed)
Patient has temp of 99.2 degrees F upon discharge. RN instructs daughter, Garner Gavel to take temperature every 4 hours at home for next 24 hours. To call [facility] for elevated temperature of 101 degrees F. Patient to drink a lot of fluids. Cough and deep breath instructed. To do 10 times every hour while awake.

## 2015-02-15 NOTE — Progress Notes (Signed)
May give additional fentanyl 150 mcg in pacu

## 2015-02-15 NOTE — Anesthesia Procedure Notes (Signed)
Procedure Name: LMA Insertion Date/Time: 02/15/2015 12:22 PM Performed by: Deliah Boston Pre-anesthesia Checklist: Patient identified, Emergency Drugs available, Suction available and Patient being monitored Patient Re-evaluated:Patient Re-evaluated prior to inductionOxygen Delivery Method: Circle system utilized Preoxygenation: Pre-oxygenation with 100% oxygen Intubation Type: IV induction Ventilation: Mask ventilation without difficulty LMA: LMA inserted LMA Size: 4.0 Number of attempts: 1 Placement Confirmation: positive ETCO2 and breath sounds checked- equal and bilateral Tube secured with: Tape Dental Injury: Teeth and Oropharynx as per pre-operative assessment

## 2015-02-15 NOTE — Transfer of Care (Signed)
Immediate Anesthesia Transfer of Care Note  Patient: [name]  Procedure(s) Performed: Procedure(s): CYSTOSCOPY WITH RIGHT URETEROSCOPY, RENAL PELVIC WASHINGS, STENT, RETROGRADE (Right)  Patient Location: PACU  Anesthesia Type:General  Level of Consciousness: Patient easily awoken, sedated, comfortable, cooperative, following commands, responds to stimulation.   Airway & Oxygen Therapy: Patient spontaneously breathing, ventilating well, oxygen via simple oxygen mask.  Post-op Assessment: Report given to PACU RN, vital signs reviewed and stable, moving all extremities.   Post vital signs: Reviewed and stable.  Complications: No apparent anesthesia complications

## 2015-02-16 NOTE — Op Note (Signed)
PATIENT:  [name]  PRE-OPERATIVE DIAGNOSIS: 1. History of transitional cell carcinoma of the bladder 2. Positive cytology from the right renal pelvis.  POST-OPERATIVE DIAGNOSIS: 1. History of transitional cell carcinoma of the bladder 2. Positive cytology from the right renal pelvis. 3. Probable transitional of the  Renal pelvis/UPJ  PROCEDURE: 1. Cystoscopy with bladder barbotage  2. Right retrograde [place] with interpretation 3. Right ureteroscopy and biopsy 4. Right double-J stent placement  SURGEON:  Claybon Jabs  INDICATION: [name] is a 81 year old male with history of transitional cell carcinoma of the bladder. He has undergone previous evaluation for abnormal cytologies with no definite source of the abnormal cytology having been identified. He recently underwent evaluation with retrograde tomography and cytologies from the right and left renal pelvis as well as from the bladder. This revealed what appeared to be a positive cytology from the right renal pelvis although no definite abnormality was identified on retrograde pyelogram previously. He is brought to the operating room for visual evaluation of the right collecting system.  ANESTHESIA:  General  EBL:  Minimal  DRAINS: 6-French, 24 cm double-J stent in the right  (no string)  LOCAL MEDICATIONS USED:  None  SPECIMEN:  1. Right renal pelvis cytology 2. Right renal pelvis brush biopsy 3. Bladder barbotage cytology  Description of procedure: After informed consent the patient was taken to the operating room and placed on the table in a supine position. General anesthesia was then administered. Once fully anesthetized the patient was moved to the dorsal lithotomy position and the genitalia were sterilely prepped and draped in standard fashion. An official timeout was then performed.  I initially passed a 23-French cystoscope with 30 lens down the urethra and noted no other malady the urethra. The  prostatic urethra revealed evidence of previous resection. There appeared to be an area at the floor of the prostatic urethra that had the appearance of a blind-ending false passage. This area could not be fully evaluated with the rigid scope. I then entered the bladder and performed a form systematic inspection and noted to be no tumors, stones or worrisome lesions. Ureteral orifices were of normal configuration and position.  A 6-French open-ended ureteral catheter was then passed through the cystoscope into the right ureteral orifice. This was used to perform a right retrograde pyelogram in the standard fashion by injecting full-strength Omnipaque contrast through the open-ended catheter up the right ureter under direct fluoroscopic control. The ureter appeared entirely normal throughout its course with no evidence of filling defect, mass effect or hydronephrosis. The renal pelvis also appeared normal with no definite filling defect identified.  A 0.038 inch floppy tip Sensor guidewire was then passed through the open-ended catheter and into the area of the right renal pelvis. I then withdrew it back to the proximal ureter so as to not cause any irritation of the renal pelvis that could be missed interpreted as CIS rather than irritation from the guidewire itself. The guidewire was left in place and the open-ended catheter was removed as was the cystoscope.  The inner portion of a ureteral access sheath was then used to gently dilate the intramural ureter and then I passed the 6-French flexible, digital ureteroscope over the guidewire and up the right ureter under direct visualization. This revealed what appeared to be a normal ureter without worrisome lesions around the length of the ureter until I arrived at the area of the UPJ where the mucosa appeared to be abnormal with possible  early papillary appearance. I then entered the renal pelvis and did not see any lesions within the pelvis. The collecting  system including the upper middle and lower pole calyces the then inspected. There appeared to be some slight abnormality of an upper pole calyx but not as concerning as the area in the UPJ region. Using saline I performed barbotaged of the renal pelvis through the ureteroscope and sent this for cytology. I next passed a biopsy brush through the ureteroscope and obtained rush biopsies from the area of the UPJ. I finally passed a Nitinol basket through the ureteroscope and attempted to obtain tissue from the UPJ but was unsuccessful with this maneuver.  I then passed a guidewire back through the ureteroscope and removed the ureteroscope and passed a 6-French open-ended catheter over the guidewire and left this in place to divert any urine coming down the right ureter and contaminating the specimen from his bladder. I then flushed the bladder several times with saline and then performed barbotaged of the bladder with the right ureter being diverted. This was sent for cytology.  I then passed the flexible ureteroscope down into the area of the prostatic urethra to try to further visualize the area in the prostatic urethra that appeared to be a false passage. The patient had reported intermittent gross hematuria and I suspect this may have been from the renal pelvis as it did bleed quite easily with very little manipulation during my evaluation of that region today however I felt the prostatic urethral false passage, which has been noted multiple times in the past, may also have been serving as a source of hematuria so I evaluated this with the flexible ureteroscope but did not find any lesions or active areas of bleeding in this location. I therefore removed the ureteroscope.  Finally I passed a guidewire back through the open-ended catheter into the area of the renal pelvis, backloaded the cystoscope over the guidewire and passed a double-J stent up to the right kidney where good curl was noted with removal of  the guidewire in the area of the renal pelvis and visually in the bladder. The bladder was then drained, the cystoscope was removed and the patient was awakened and taken to the recovery room in stable and satisfactory condition. He tolerated the procedure well with no intraoperative complications.  PLAN OF CARE: Discharge to home after PACU  PATIENT DISPOSITION:  PACU - hemodynamically stable.

## 2015-02-18 ENCOUNTER — Encounter (HOSPITAL_COMMUNITY): Payer: Self-pay | Admitting: Urology

## 2015-02-26 ENCOUNTER — Encounter: Payer: Medicare Other | Admitting: Cardiovascular Disease

## 2015-02-26 ENCOUNTER — Ambulatory Visit (INDEPENDENT_AMBULATORY_CARE_PROVIDER_SITE_OTHER): Payer: Medicare Other | Admitting: *Deleted

## 2015-02-26 DIAGNOSIS — I481 Persistent atrial fibrillation: Secondary | ICD-10-CM | POA: Diagnosis not present

## 2015-02-26 DIAGNOSIS — I4819 Other persistent atrial fibrillation: Secondary | ICD-10-CM

## 2015-02-28 NOTE — Progress Notes (Signed)
Remote pacemaker transmission.   

## 2015-03-04 ENCOUNTER — Telehealth: Payer: Self-pay | Admitting: Hematology

## 2015-03-04 NOTE — Telephone Encounter (Signed)
Moved 10/5 f/u from CP1 to [place]. Spoke with patient he is aware of lab/GK 10/5 @ 1pm.

## 2015-03-13 LAB — CUP PACEART REMOTE DEVICE CHECK
Battery Remaining Longevity: 108 mo
Lead Channel Pacing Threshold Amplitude: 1 V
Lead Channel Pacing Threshold Pulse Width: 0.4 ms
Lead Channel Sensing Intrinsic Amplitude: 25 mV
Lead Channel Setting Pacing Amplitude: 1.4 V
Lead Channel Setting Pacing Pulse Width: 0.4 ms
Lead Channel Setting Sensing Sensitivity: 2.5 mV
MDC IDC MSMT BATTERY REMAINING PERCENTAGE: 100 %
MDC IDC MSMT LEADCHNL RV IMPEDANCE VALUE: 731 Ohm
MDC IDC SESS DTM: 20160906060200
MDC IDC SET ZONE DETECTION INTERVAL: 375 ms
MDC IDC STAT BRADY RV PERCENT PACED: 72 %
Pulse Gen Serial Number: 119617

## 2015-03-26 ENCOUNTER — Encounter: Payer: Self-pay | Admitting: *Deleted

## 2015-03-27 ENCOUNTER — Ambulatory Visit: Payer: Medicare Other | Admitting: Hematology

## 2015-03-27 ENCOUNTER — Encounter: Payer: Self-pay | Admitting: Cardiology

## 2015-03-27 ENCOUNTER — Other Ambulatory Visit: Payer: Medicare Other

## 2015-04-09 ENCOUNTER — Encounter: Payer: Self-pay | Admitting: Cardiovascular Disease

## 2015-04-23 ENCOUNTER — Encounter: Payer: Self-pay | Admitting: *Deleted

## 2015-05-20 ENCOUNTER — Encounter: Payer: Self-pay | Admitting: *Deleted

## 2015-06-18 ENCOUNTER — Encounter: Payer: Self-pay | Admitting: *Deleted

## 2015-07-18 ENCOUNTER — Telehealth: Payer: Self-pay | Admitting: Oncology

## 2015-07-18 NOTE — Telephone Encounter (Signed)
PT RETURNED CALL; CONFIRMED NEW DATE OF 2/7 AT 10:30; MAILED OUT CALENDAR

## 2015-07-18 NOTE — Telephone Encounter (Signed)
PT CONFIRMED APPT ON 07/25/15 AT 1:30PM

## 2015-07-18 NOTE — Telephone Encounter (Signed)
LT MESS REGARDING A CHANGE IN PT'S APPT TO Tuesday 2/7 AT 10:30AM.

## 2015-07-25 ENCOUNTER — Ambulatory Visit: Payer: Medicare Other | Admitting: Oncology

## 2015-07-29 ENCOUNTER — Telehealth: Payer: Self-pay | Admitting: *Deleted

## 2015-07-29 NOTE — Telephone Encounter (Signed)
Called and spoke with daughter reminding patient of his appointment tomorrow with Dr. Alen Blew. Daughter verbalized understanding.

## 2015-07-30 ENCOUNTER — Telehealth: Payer: Self-pay | Admitting: Oncology

## 2015-07-30 ENCOUNTER — Ambulatory Visit (HOSPITAL_BASED_OUTPATIENT_CLINIC_OR_DEPARTMENT_OTHER): Payer: Medicare Other | Admitting: Oncology

## 2015-07-30 ENCOUNTER — Telehealth: Payer: Self-pay | Admitting: *Deleted

## 2015-07-30 VITALS — BP 178/83 | HR 100 | Temp 98.6°F | Resp 19 | Wt 189.8 lb

## 2015-07-30 DIAGNOSIS — C661 Malignant neoplasm of right ureter: Secondary | ICD-10-CM

## 2015-07-30 DIAGNOSIS — C651 Malignant neoplasm of right renal pelvis: Secondary | ICD-10-CM

## 2015-07-30 DIAGNOSIS — C659 Malignant neoplasm of unspecified renal pelvis: Secondary | ICD-10-CM | POA: Insufficient documentation

## 2015-07-30 MED ORDER — PROCHLORPERAZINE MALEATE 10 MG PO TABS: 10.0000 mg | ORAL_TABLET | Freq: Four times a day (QID) | ORAL | Status: DC | PRN

## 2015-07-30 NOTE — Consult Note (Signed)
Reason for Referral: Transitional cell carcinoma of the right renal pelvis.   HPI: Mr. [name] is a pleasant 82 year old gentleman currently of [place] where he lives with his daughter. Gentleman with history of atrial fibrillation, hypertension as well as diabetes. He also has history of parotid tumor that was removed surgically in 2008. He received adjuvantly radiation therapy as well as chemotherapy under the care of Dr. Fermin Schwab. He received Taxotere and carboplatin concomitantly with radiation completed in December 2008. He had a Port-A-Cath removed in 2012.   He has also developed recurrent hematuria and found to have superficial bladder tumors and have been under the care of Dr. Karsten Ro since 2009. He had repeated cystoscopies and BCG treatment previously. Most recently he underwent a cystoscopy and right ureteroscopy in August 2016. At that time he was found to have a visible findings of diffuse transitional cell carcinoma around the UPJ in the renal pelvis. Positive malignant cells were noted on a biopsy. Urine cytology also showed positive for malignancy. He was scheduled to have staging CT scan and evaluation by Dr. Pilar Jarvis but he canceled those appointments because of her the loss of his stepson. Most recently he underwent staging CT scan on 07/08/2015 which showed retroperitoneal lymphadenopathy measuring 1.4 cm in the left periaortic station. Abnormal lymph nodes noted on the iliac chain as well as the left external iliac station. That will J stent noted on the right which was placed previously. Patient referred to me for further evaluation regarding these findings.  Clinically, he feels relatively fair. He does report intermittent hematuria but no other genitourinary complaints. Has not reported any abdominal pain or pelvic pain. Does not report any constitutional symptoms of weight loss or appetite changes.  He does not report any headaches, blurry vision, syncope or seizures. He does not  report any fevers, chills or sweats. Does not report any appetite changes or weight loss. Does not report any chest pain, palpitation, orthopnea but does report leg edema. He does not report any cough, wheezing or hemoptysis. Does not report any nausea, vomiting, constipation or diarrhea. He does not report any skeletal complaints of arthralgias or myalgias. Does not report any lymphadenopathy or petechiae. Does not report any mood issues. Remaining review of systems unremarkable.   Past Medical History  Diagnosis Date  . Atrial fibrillation 1990  . Dyslipidemia   . Prostate hypertrophy   . Arthritis     degenerative-knees  . History of radiation therapy 05/05/07-06/15/07    right parotid through subclavicular region/ mid jugularlymph node chain  . Allergy   . Hypertension     labile  . Heart murmur     2/6 systolic  . History of chemotherapy      Hx carboplatin/75fu  . Xerostomia     limited  . Stroke 2003    TIA  . HOH (hard of hearing)   . Pacemaker     2003 vent paced , rate 69  . Chronic kidney disease     positive cytology  . Dry eyes   . GERD (gastroesophageal reflux disease)   . History of radiation therapy 05/05/07-06/15/07    Right parotid/hemineck,supraclavicular  . Coronary artery disease   . Urinary tract infection     hx of   . Bladder cancer 2010  . Cancer 05/05/07-06/15/07    parotid gland/66.4 GY  . Cancer     left eyelid   . Tinnitus     right   . Right groin hernia   . Pneumonia  hx of   . Dysrhythmia     HX A FIB  . Skin cancer     HX BASAL CELL REMOVED  . Diabetes mellitus without complication     meds d/c  2-3 year ago  :  Past Surgical History  Procedure Laterality Date  . Pacemaker insertion  1990    last changed 2003  . Cystoscopy  01/2011    neg  . Back surgery    . Parotidectomy      right  . Craniotomy  12/14/2011    Procedure: CRANIOTOMY HEMATOMA EVACUATION SUBDURAL;  Surgeon: Ophelia Charter, MD;  Location: [place] [facility];   Service: Neurosurgery;  Laterality: Left;  LEFT Craniotomy for subdural   . Neurofibroma excision  1980  . Cystoscopy with biopsy  04/01/2012    Procedure: CYSTOSCOPY WITH BIOPSY;  Surgeon: Claybon Jabs, MD;  Location[facility];  Service: Urology;  Laterality: N/A;  BLADDER BIOPSIES and bladder washings  . Cystoscopy w/ retrogrades  04/01/2012    Procedure: CYSTOSCOPY WITH RETROGRADE PYELOGRAM;  Surgeon: Claybon Jabs, MD;  Location: [facility];  Service: Urology;  Laterality: Bilateral;  . Ureteroscopy  04/01/2012    Procedure: URETEROSCOPY;  Surgeon: Claybon Jabs, MD;  Location: [facility];  Service: Urology;  Laterality: Right;  . Cystoscopy/retrograde/ureteroscopy  05/02/2012    Procedure: CYSTOSCOPY/RETROGRADE/URETEROSCOPY;  Surgeon: Claybon Jabs, MD;  Location: [facility];  Service: Urology;  Laterality: Bilateral;  CYSTOSCOPY BILATERAL RETROGRADE PYLOGRAM BILATERAL URETEROSCOPY AND BIOPSY    . Cystoscopy with biopsy  05/02/2012    Procedure: CYSTOSCOPY WITH BIOPSY;  Surgeon: Claybon Jabs, MD;  Location[facility];  Service: Urology;  Laterality: N/A;  . Cystoscopy with stent placement  05/02/2012    Procedure: CYSTOSCOPY WITH STENT PLACEMENT;  Surgeon: Claybon Jabs, MD;  Location: [facility];  Service: Urology;  Laterality: Bilateral;  . Pacemaker generator change  05/23/2002    [place], [place], serial [id number]  . Lower extremity arterial doppler  09/28/2011    No evidence of arterial insufficiency.   . Cardiovascular stress test  09/05/2009    Pharmalogical stress test w/o chest pain or EKG changes for ischemia  . Cardiac catheterization  09/20/2012    EF 50-55%, moderately calcified aortic valve leaflets, mild-moderate aortic valve stenosis, LA-appendage moderately dilated, moderate regurg of the mitral valve, RA moderately dilated  . Pacemaker generator  change N/A 09/01/2013    Procedure: PACEMAKER GENERATOR CHANGE;  Surgeon: Sanda Klein, MD;  Location: [place] [facility];  Service: Cardiovascular;  Laterality: N/A;  . Cystoscopy/retrograde/ureteroscopy Bilateral 01/21/2015    Procedure: CYSTOSCOPY/RETROGRADE/ BLADDER BIOPSY;  Surgeon: Kathie Rhodes, MD;  Location: [facility];  Service: Urology;  Laterality: Bilateral;  . Cystoscopy with ureteroscopy and stent placement Right 02/15/2015    Procedure: CYSTOSCOPY WITH RIGHT URETEROSCOPY, RENAL PELVIC WASHINGS, STENT, RETROGRADE;  Surgeon: Kathie Rhodes, MD;  Location: [facility];  Service: Urology;  Laterality: Right;  :   Current outpatient prescriptions:  .  acetaminophen (TYLENOL) 325 MG tablet, Take 1-2 tablets (325-650 mg total) by mouth every 4 (four) hours as needed for mild pain., Disp: , Rfl:  .  atorvastatin (LIPITOR) 40 MG tablet, Take 40 mg by mouth every morning. Reported on 07/30/2015, Disp: , Rfl:  .  clopidogrel (PLAVIX) 75 MG tablet, Take 75 mg by mouth every morning. Reported on 07/30/2015, Disp: , Rfl:  .  finasteride (PROSCAR) 5  MG tablet, Take 5 mg by mouth daily as needed. Reported on 07/30/2015, Disp: , Rfl:  .  HYDROcodone-acetaminophen (NORCO) 10-325 MG per tablet, Take 1-2 tablets by mouth every 4 (four) hours as needed for moderate pain. Maximum dose per 24 hours - 8 pills (Patient not taking: Reported on 07/30/2015), Disp: 20 tablet, Rfl: 0 .  pantoprazole (PROTONIX) 40 MG tablet, Take 1 tablet (40 mg total) by mouth daily. (Patient not taking: Reported on 07/30/2015), Disp: 30 tablet, Rfl: 11 .  phenazopyridine (PYRIDIUM) 200 MG tablet, Take 1 tablet (200 mg total) by mouth 3 (three) times daily as needed for pain. (Patient not taking: Reported on 07/30/2015), Disp: 30 tablet, Rfl: 0 .  prochlorperazine (COMPAZINE) 10 MG tablet, Take 1 tablet (10 mg total) by mouth every 6 (six) hours as needed for nausea or vomiting., Disp: 30 tablet, Rfl: 0 .  ramipril (ALTACE) 10 MG capsule, Take 2 capsules (20 mg  total) by mouth daily. (Patient not taking: Reported on 07/30/2015), Disp: 60 capsule, Rfl: 6:  Allergies  Allergen Reactions  . Codeine Other (See Comments)    Blisters between fingers  . Docetaxel Rash  :  Family History  Problem Relation Age of Onset  . Heart disease Mother   . Cancer Father     Lung cancer  . Emphysema Brother   . Coronary artery disease Son   :  Social History   Social History  . Marital Status: Widowed    Spouse Name: N/A  . Number of Children: N/A  . Years of Education: N/A   Occupational History  . Not on file.   Social History Main Topics  . Smoking status: Never Smoker   . Smokeless tobacco: Never Used  . Alcohol Use: 10.5 oz/week    21 Standard drinks or equivalent per week     Comment: 3 /day( bourbon)  . Drug Use: No  . Sexual Activity: No   Other Topics Concern  . Not on file   Social History Narrative  :  Pertinent items are noted in HPI.  Exam: ECOG 1 Blood pressure 178/83, pulse 100, temperature 98.6 F (37 C), temperature source Oral, resp. rate 19, weight 189 lb 12.8 oz (86.093 kg), SpO2 98 %. General appearance: alert and cooperative without distress hard of hearing. Head: Normocephalic, without obvious abnormality Throat: lips, mucosa, and tongue normal; teeth and gums normal Back: negative Resp: clear to auscultation bilaterally Chest wall: no tenderness Cardio: regular rate and rhythm, S1, S2 normal, no murmur, click, rub or gallop GI: soft, non-tender; bowel sounds normal; no masses,  no organomegaly  Extremities: Trace edema noted.   CBC    Component Value Date/Time   WBC 6.7 02/12/2015 1210   WBC 9.8 03/28/2014 1307   RBC 4.10* 02/12/2015 1210   RBC 4.56 03/28/2014 1307   HGB 14.2 02/12/2015 1210   HGB 14.5 03/28/2014 1307   HCT 41.9 02/12/2015 1210   HCT 43.5 03/28/2014 1307   PLT 189 02/12/2015 1210   PLT 200 03/28/2014 1307   MCV 102.2* 02/12/2015 1210   MCV 95.4 03/28/2014 1307   MCH 34.6*  02/12/2015 1210   MCH 31.9 03/28/2014 1307   MCHC 33.9 02/12/2015 1210   MCHC 33.4 03/28/2014 1307   RDW 13.0 02/12/2015 1210   RDW 14.2 03/28/2014 1307   LYMPHSABS 1.7 03/28/2014 1307   LYMPHSABS 1.0 02/26/2013 1650   MONOABS 0.4 03/28/2014 1307   MONOABS 0.7 02/26/2013 1650   EOSABS 0.1 03/28/2014 1307  EOSABS 0.0 02/26/2013 1650   BASOSABS 0.1 03/28/2014 1307   BASOSABS 0.0 02/26/2013 1650      Chemistry      Component Value Date/Time   NA 137 02/12/2015 1210   NA 139 03/28/2014 1306   K 4.5 02/12/2015 1210   K 4.2 03/28/2014 1306   CL 101 02/12/2015 1210   CO2 26 02/12/2015 1210   CO2 30* 03/28/2014 1306   BUN 11 02/12/2015 1210   BUN 19.1 03/28/2014 1306   CREATININE 0.88 02/12/2015 1210   CREATININE 1.3 03/28/2014 1306   CREATININE 0.92 08/29/2013 1519      Component Value Date/Time   CALCIUM 9.0 02/12/2015 1210   CALCIUM 10.1 03/28/2014 1306   ALKPHOS 99 01/16/2015 1445   ALKPHOS 121 03/28/2014 1306   AST 22 01/16/2015 1445   AST 16 03/28/2014 1306   ALT 12* 01/16/2015 1445   ALT 11 03/28/2014 1306   BILITOT 1.8* 01/16/2015 1445   BILITOT 1.72* 03/28/2014 1306       Assessment and Plan:   82 year old gentleman with the following issues:  1. 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 CT scan in January 2017 showed pelvic adenopathy indicating metastatic disease.  The natural course of this disease was reviewed today with the patient and his daughter. I do not think this presentation represents curable disease. Even with aggressive treatment with multiagent systemic chemotherapy, followed by surgery and possible radiation the cure rate is very low. Given his age, multiple comorbid conditions it'll be extremely unlikely that we'll be achieved. That means the only option we have is palliative Treatment with chemotherapy.  The rationale for using carboplatin and  gemcitabine was used. Then if it will be decrease in the size of his pelvic adenopathy and palliate his symptoms. Side effects associated with the treatment will include nausea, fatigue, tiredness, myelosuppression, thrombocytopenia, bleeding, neutropenia, neutropenic sepsis and possible need for hospitalization and serious illnesses and rarely death.  After discussing the risks and benefits is agreeable to proceed after chemotherapy class.  2. IV access: Risks and benefits of a Port-A-Cath was discussed. He prefers to have that placed again and we will arrange for that to be done in the near future. He is familiar with the logistics of this procedure. Complications such as bleeding, infection and thrombosis were also repeated today.  3. Renal function surveillance: His creatinine continues to be around 0.8 with a double-J stent placed on the right side.  4. Nausea prophylaxis: Compazine made available to the patient to use for breakthrough.  5. Follow-up: Will be with the start of chemotherapy anticipated to be in the next 1-2 weeks.

## 2015-07-30 NOTE — Telephone Encounter (Signed)
Per staff message and POF I have scheduled appts. Advised scheduler of appts. JMW  

## 2015-07-30 NOTE — Progress Notes (Signed)
Please see consult note.  

## 2015-07-30 NOTE — Telephone Encounter (Signed)
per pof to sch pt appt-sent MW email to sch trmt-pt to get updated copy 2/13 @ Chemo class

## 2015-08-02 ENCOUNTER — Other Ambulatory Visit: Payer: Self-pay | Admitting: Radiology

## 2015-08-05 ENCOUNTER — Encounter (HOSPITAL_COMMUNITY): Payer: Self-pay

## 2015-08-05 ENCOUNTER — Other Ambulatory Visit: Payer: Medicare Other

## 2015-08-05 ENCOUNTER — Ambulatory Visit (HOSPITAL_COMMUNITY)
Admission: RE | Admit: 2015-08-05 | Discharge: 2015-08-05 | Disposition: A | Discharge status: Home / Self Care | Payer: Medicare Other | Source: Ambulatory Visit | Attending: Oncology | Admitting: Oncology

## 2015-08-05 ENCOUNTER — Other Ambulatory Visit: Payer: Self-pay | Admitting: Oncology

## 2015-08-05 DIAGNOSIS — C661 Malignant neoplasm of right ureter: Secondary | ICD-10-CM | POA: Insufficient documentation

## 2015-08-05 DIAGNOSIS — Z8673 Personal history of transient ischemic attack (TIA), and cerebral infarction without residual deficits: Secondary | ICD-10-CM | POA: Diagnosis not present

## 2015-08-05 DIAGNOSIS — N400 Enlarged prostate without lower urinary tract symptoms: Secondary | ICD-10-CM | POA: Diagnosis not present

## 2015-08-05 DIAGNOSIS — C651 Malignant neoplasm of right renal pelvis: Secondary | ICD-10-CM

## 2015-08-05 DIAGNOSIS — Z8551 Personal history of malignant neoplasm of bladder: Secondary | ICD-10-CM | POA: Insufficient documentation

## 2015-08-05 DIAGNOSIS — Z7902 Long term (current) use of antithrombotics/antiplatelets: Secondary | ICD-10-CM | POA: Insufficient documentation

## 2015-08-05 DIAGNOSIS — Z85828 Personal history of other malignant neoplasm of skin: Secondary | ICD-10-CM | POA: Diagnosis not present

## 2015-08-05 DIAGNOSIS — I129 Hypertensive chronic kidney disease with stage 1 through stage 4 chronic kidney disease, or unspecified chronic kidney disease: Secondary | ICD-10-CM | POA: Diagnosis not present

## 2015-08-05 DIAGNOSIS — E1122 Type 2 diabetes mellitus with diabetic chronic kidney disease: Secondary | ICD-10-CM | POA: Insufficient documentation

## 2015-08-05 DIAGNOSIS — Z950 Presence of cardiac pacemaker: Secondary | ICD-10-CM | POA: Insufficient documentation

## 2015-08-05 DIAGNOSIS — M1990 Unspecified osteoarthritis, unspecified site: Secondary | ICD-10-CM | POA: Insufficient documentation

## 2015-08-05 DIAGNOSIS — N189 Chronic kidney disease, unspecified: Secondary | ICD-10-CM | POA: Insufficient documentation

## 2015-08-05 DIAGNOSIS — I2510 Atherosclerotic heart disease of native coronary artery without angina pectoris: Secondary | ICD-10-CM | POA: Insufficient documentation

## 2015-08-05 DIAGNOSIS — E785 Hyperlipidemia, unspecified: Secondary | ICD-10-CM | POA: Insufficient documentation

## 2015-08-05 DIAGNOSIS — Z85818 Personal history of malignant neoplasm of other sites of lip, oral cavity, and pharynx: Secondary | ICD-10-CM | POA: Insufficient documentation

## 2015-08-05 DIAGNOSIS — R011 Cardiac murmur, unspecified: Secondary | ICD-10-CM | POA: Diagnosis not present

## 2015-08-05 DIAGNOSIS — K219 Gastro-esophageal reflux disease without esophagitis: Secondary | ICD-10-CM | POA: Insufficient documentation

## 2015-08-05 DIAGNOSIS — I4891 Unspecified atrial fibrillation: Secondary | ICD-10-CM | POA: Insufficient documentation

## 2015-08-05 LAB — CBC WITH DIFFERENTIAL/PLATELET
BASOS ABS: 0 10*3/uL (ref 0.0–0.1)
Basophils Relative: 0 %
Eosinophils Absolute: 0.1 10*3/uL (ref 0.0–0.7)
Eosinophils Relative: 1 %
HEMATOCRIT: 43.1 % (ref 39.0–52.0)
Hemoglobin: 14.7 g/dL (ref 13.0–17.0)
LYMPHS ABS: 1.5 10*3/uL (ref 0.7–4.0)
LYMPHS PCT: 19 %
MCH: 33.9 pg (ref 26.0–34.0)
MCHC: 34.1 g/dL (ref 30.0–36.0)
MCV: 99.3 fL (ref 78.0–100.0)
MONO ABS: 0.6 10*3/uL (ref 0.1–1.0)
MONOS PCT: 7 %
NEUTROS ABS: 5.7 10*3/uL (ref 1.7–7.7)
Neutrophils Relative %: 73 %
Platelets: 211 10*3/uL (ref 150–400)
RBC: 4.34 MIL/uL (ref 4.22–5.81)
RDW: 13.8 % (ref 11.5–15.5)
WBC: 7.8 10*3/uL (ref 4.0–10.5)

## 2015-08-05 LAB — PROTIME-INR
INR: 1.13 (ref 0.00–1.49)
Prothrombin Time: 14.7 seconds (ref 11.6–15.2)

## 2015-08-05 LAB — BASIC METABOLIC PANEL
ANION GAP: 12 (ref 5–15)
BUN: 11 mg/dL (ref 6–20)
CHLORIDE: 100 mmol/L — AB (ref 101–111)
CO2: 27 mmol/L (ref 22–32)
Calcium: 9.3 mg/dL (ref 8.9–10.3)
Creatinine, Ser: 0.92 mg/dL (ref 0.61–1.24)
GFR calc Af Amer: >60 mL/min (ref >60)
GLUCOSE: 138 mg/dL — AB (ref 65–99)
POTASSIUM: 3.3 mmol/L — AB (ref 3.5–5.1)
Sodium: 139 mmol/L (ref 135–145)

## 2015-08-05 LAB — GLUCOSE, CAPILLARY: GLUCOSE-CAPILLARY: 120 mg/dL — AB (ref 65–99)

## 2015-08-05 MED ORDER — LIDOCAINE HCL 1 % IJ SOLN
INTRAMUSCULAR | Status: AC
  Filled 2015-08-05: qty 20

## 2015-08-05 MED ORDER — HEPARIN SOD (PORK) LOCK FLUSH 100 UNIT/ML IV SOLN
INTRAVENOUS | Status: AC | PRN
  Administered 2015-08-05: 500 [IU]

## 2015-08-05 MED ORDER — SODIUM CHLORIDE 0.9 % IV SOLN
INTRAVENOUS | Status: DC
  Administered 2015-08-05: 12:00:00 via INTRAVENOUS

## 2015-08-05 MED ORDER — MIDAZOLAM HCL 2 MG/2ML IJ SOLN
INTRAMUSCULAR | Status: AC
  Filled 2015-08-05: qty 6

## 2015-08-05 MED ORDER — LIDOCAINE-EPINEPHRINE 2 %-1:100000 IJ SOLN
INTRAMUSCULAR | Status: DC
Start: 2015-08-05 — End: 2015-08-05
  Filled 2015-08-05: qty 1

## 2015-08-05 MED ORDER — CEFAZOLIN SODIUM-DEXTROSE 2-3 GM-% IV SOLR: 2.0000 g | INTRAVENOUS | Status: DC

## 2015-08-05 MED ORDER — FENTANYL CITRATE (PF) 100 MCG/2ML IJ SOLN
INTRAMUSCULAR | Status: AC | PRN
  Administered 2015-08-05: 50 ug via INTRAVENOUS

## 2015-08-05 MED ORDER — MIDAZOLAM HCL 2 MG/2ML IJ SOLN
INTRAMUSCULAR | Status: AC | PRN
  Administered 2015-08-05 (×4): 1 mg via INTRAVENOUS

## 2015-08-05 MED ORDER — HEPARIN SOD (PORK) LOCK FLUSH 100 UNIT/ML IV SOLN
INTRAVENOUS | Status: AC
  Filled 2015-08-05: qty 5

## 2015-08-05 MED ORDER — FENTANYL CITRATE (PF) 100 MCG/2ML IJ SOLN
INTRAMUSCULAR | Status: AC
  Filled 2015-08-05: qty 4

## 2015-08-05 MED ORDER — CEFAZOLIN SODIUM-DEXTROSE 2-3 GM-% IV SOLR
INTRAVENOUS | Status: AC
  Filled 2015-08-05: qty 50

## 2015-08-05 NOTE — H&P (Signed)
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 He presents today for placement of a new Port-A-Cath for chemotherapy. Patient currently denies fever, headache, dyspnea, abdominal/back pain, or nausea/vomiting. He has had some recent sinus congestion, occasional cough/chest discomfort and hematuria. Past Medical History  Diagnosis Date  . Atrial fibrillation ([place]) 1990  . Dyslipidemia   . Prostate hypertrophy   . Arthritis     degenerative-knees  . History of radiation therapy 05/05/07-06/15/07    right parotid through subclavicular region/ mid jugularlymph node chain  . Allergy   . Hypertension     labile  . Heart murmur     2/6 systolic  . History of chemotherapy      Hx carboplatin/38fu  . Xerostomia     limited  . Stroke [facility]) 2003    TIA  . HOH (hard of hearing)   . Pacemaker     2003 vent paced , rate 69  . Chronic kidney disease     positive cytology  . Dry eyes   . GERD (gastroesophageal reflux disease)   . History of radiation therapy 05/05/07-06/15/07    Right parotid/hemineck,supraclavicular  . Coronary artery disease   . Urinary tract infection     hx of   . Bladder cancer ([place]) 2010  . Cancer ([place]) 05/05/07-06/15/07    parotid gland/66.4 GY  . Cancer ([place])     left eyelid   . Tinnitus     right   . Right groin hernia   . Pneumonia     hx of   . Dysrhythmia     HX A FIB  . Skin cancer     HX BASAL CELL REMOVED  . Diabetes mellitus without complication ([place])     meds d/c  2-3 year ago   Past Surgical History  Procedure Laterality Date  . Pacemaker insertion  1990    last changed  2003  . Cystoscopy  01/2011    neg  . Back surgery    . Parotidectomy      right  . Craniotomy  12/14/2011    Procedure: CRANIOTOMY HEMATOMA EVACUATION SUBDURAL;  Surgeon: Ophelia Charter, MD;  Location: [place];  Service: Neurosurgery;  Laterality: Left;  LEFT Craniotomy for subdural   . Neurofibroma excision  1980  . Cystoscopy with biopsy  04/01/2012    Procedure: CYSTOSCOPY WITH BIOPSY;  Surgeon: Claybon Jabs, MD;  Location: [facility];  Service: Urology;  Laterality: N/A;  BLADDER BIOPSIES and bladder washings  . Cystoscopy w/ retrogrades  04/01/2012    Procedure: CYSTOSCOPY WITH RETROGRADE PYELOGRAM;  Surgeon: Claybon Jabs, MD;  Location: [facility];  Service: Urology;  Laterality: Bilateral;  . Ureteroscopy  04/01/2012    Procedure: URETEROSCOPY;  Surgeon: Claybon Jabs, MD;  Location: [facility];  Service: Urology;  Laterality: Right;  . Cystoscopy/retrograde/ureteroscopy  05/02/2012    Procedure: CYSTOSCOPY/RETROGRADE/URETEROSCOPY;  Surgeon: Claybon Jabs, MD;  Location: [facility];  Service: Urology;  Laterality: Bilateral;  CYSTOSCOPY BILATERAL RETROGRADE PYLOGRAM BILATERAL URETEROSCOPY AND BIOPSY    . Cystoscopy with biopsy  05/02/2012    Procedure:  CYSTOSCOPY WITH BIOPSY;  Surgeon: Claybon Jabs, MD;  Location: [facility];  Service: Urology;  Laterality: N/A;  . Cystoscopy with stent placement  05/02/2012    Procedure: CYSTOSCOPY WITH STENT PLACEMENT;  Surgeon: Claybon Jabs, MD;  Location: [facility];  Service: Urology;  Laterality: Bilateral;  . Pacemaker generator change  05/23/2002    [place], [place], serial [id number]  . Lower extremity arterial doppler  09/28/2011    No evidence of arterial insufficiency.   . Cardiovascular stress test  09/05/2009    Pharmalogical stress test w/o chest pain or EKG changes for ischemia  . Cardiac catheterization   09/20/2012    EF 50-55%, moderately calcified aortic valve leaflets, mild-moderate aortic valve stenosis, LA-appendage moderately dilated, moderate regurg of the mitral valve, RA moderately dilated  . Pacemaker generator change N/A 09/01/2013    Procedure: PACEMAKER GENERATOR CHANGE;  Surgeon: Sanda Klein, MD;  Location: [place] [facility];  Service: Cardiovascular;  Laterality: N/A;  . Cystoscopy/retrograde/ureteroscopy Bilateral 01/21/2015    Procedure: CYSTOSCOPY/RETROGRADE/ BLADDER BIOPSY;  Surgeon: Kathie Rhodes, MD;  Location: [facility];  Service: Urology;  Laterality: Bilateral;  . Cystoscopy with ureteroscopy and stent placement Right 02/15/2015    Procedure: CYSTOSCOPY WITH RIGHT URETEROSCOPY, RENAL PELVIC WASHINGS, STENT, RETROGRADE;  Surgeon: Kathie Rhodes, MD;  Location: [facility];  Service: Urology;  Laterality: Right;     Allergies: Codeine and Docetaxel  Medications: Prior to Admission medications   Medication Sig Start Date End Date Taking? Authorizing Provider  acetaminophen (TYLENOL) 325 MG tablet Take 1-2 tablets (325-650 mg total) by mouth every 4 (four) hours as needed for mild pain. 09/02/13  Yes Lendon Colonel, NP  atorvastatin (LIPITOR) 40 MG tablet Take 40 mg by mouth every morning. Reported on 07/30/2015   Yes Historical Provider, MD  clopidogrel (PLAVIX) 75 MG tablet Take 75 mg by mouth every morning. Reported on 07/30/2015   Yes Historical Provider, MD  finasteride (PROSCAR) 5 MG tablet Take 5 mg by mouth daily as needed. Reported on 07/30/2015   Yes Historical Provider, MD  HYDROcodone-acetaminophen (NORCO) 10-325 MG per tablet Take 1-2 tablets by mouth every 4 (four) hours as needed for moderate pain. Maximum dose per 24 hours - 8 pills 02/15/15  Yes Kathie Rhodes, MD  pantoprazole (PROTONIX) 40 MG tablet Take 1 tablet (40 mg total) by mouth daily. 07/17/14  Yes Brett Canales, PA-C  phenazopyridine (PYRIDIUM) 200 MG tablet Take 1 tablet (200 mg total) by mouth 3 (three) times daily as  needed for pain. 01/21/15  Yes Kathie Rhodes, MD  prochlorperazine (COMPAZINE) 10 MG tablet Take 1 tablet (10 mg total) by mouth every 6 (six) hours as needed for nausea or vomiting. 07/30/15  Yes Wyatt Portela, MD  ramipril (ALTACE) 10 MG capsule Take 2 capsules (20 mg total) by mouth daily. 07/17/14  Yes Brett Canales, PA-C     Vital Signs: Blood pressure 184/84, temp 97.3, heart rate 73, respirations 20, oxygen saturation is 100% room air   Physical Exam patient awake, alert. Chest clear to auscultation bilaterally; left chest wall pacer; heart with paced rhythm, 2/6 systolic murmur; abd soft,+BS,NT; bilat LE with edema, chronic venous stasis changes  Imaging: No results found.  Labs:  CBC:  Recent Labs  01/16/15 1445 02/12/15 1210  WBC 9.2 6.7  HGB 14.0 14.2  HCT 42.1 41.9  PLT 237 189    COAGS: No results for input(s): INR, APTT in the last  8760 hours.  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 He presents today for placement of a new Port-A-Cath for chemotherapy. Risks and benefits discussed with the patient /daughter including, but not limited to bleeding, infection, pneumothorax, or fibrin sheath development and need for additional procedures.All of the patient's questions were answered, patient is agreeable to proceed.Consent signed and in chart.   Electronically Signed: D. Rowe Robert 08/05/2015, 12:16 PM   I spent a  total of 15 minutes at the the patient's bedside AND on the patient's hospital floor or unit, greater than 50% of which was counseling/coordinating care for port a cath placement

## 2015-08-05 NOTE — Sedation Documentation (Signed)
Patient denies pain and is resting comfortably.  

## 2015-08-05 NOTE — Discharge Instructions (Signed)
Implanted Port Insertion, Care After °Refer to this sheet in the next few weeks. These instructions provide you with information on caring for yourself after your procedure. Your health care provider may also give you more specific instructions. Your treatment has been planned according to current medical practices, but problems sometimes occur. Call your health care provider if you have any problems or questions after your procedure. °WHAT TO EXPECT AFTER THE PROCEDURE °After your procedure, it is typical to have the following:  °· Discomfort at the port insertion site. Ice packs to the area will help. °· Bruising on the skin over the port. This will subside in 3-4 days. °HOME CARE INSTRUCTIONS °· After your port is placed, you will get a manufacturer's information card. The card has information about your port. Keep this card with you at all times.   °· Know what kind of port you have. There are many types of ports available.   °· Wear a medical alert bracelet in case of an emergency. This can help alert health care workers that you have a port.   °· The port can stay in for as long as your health care provider believes it is necessary.   °· A home health care nurse may give medicines and take care of the port.   °· You or a family member can get special training and directions for giving medicine and taking care of the port at home.   °SEEK MEDICAL CARE IF:  °· Your port does not flush or you are unable to get a blood return.   °· You have a fever or chills. °SEEK IMMEDIATE MEDICAL CARE IF: °· You have new fluid or pus coming from your incision.   °· You notice a bad smell coming from your incision site.   °· You have swelling, pain, or more redness at the incision or port site.   °· You have chest pain or shortness of breath. °  °This information is not intended to replace advice given to you by your health care provider. Make sure you discuss any questions you have with your health care provider. °  °Document  Released: 03/29/2013 Document Revised: 06/13/2013 Document Reviewed: 03/29/2013 °Elsevier Interactive Patient Education ©2016 [employer] °Implanted Port Home Guide °An implanted port is a type of central line that is placed under the skin. Central lines are used to provide IV access when treatment or nutrition needs to be given through a person's veins. Implanted ports are used for long-term IV access. An implanted port may be placed because:  °· You need IV medicine that would be irritating to the small veins in your hands or arms.   °· You need long-term IV medicines, such as antibiotics.   °· You need IV nutrition for a long period.   °· You need frequent blood draws for lab tests.   °· You need dialysis.   °Implanted ports are usually placed in the chest area, but they can also be placed in the upper arm, the abdomen, or the leg. An implanted port has two main parts:  °· Reservoir. The reservoir is round and will appear as a small, raised area under your skin. The reservoir is the part where a needle is inserted to give medicines or draw blood.   °· Catheter. The catheter is a thin, flexible tube that extends from the reservoir. The catheter is placed into a large vein. Medicine that is inserted into the reservoir goes into the catheter and then into the vein.   °HOW WILL I CARE FOR MY INCISION SITE? °Do not get the   incision site wet. Bathe or shower as directed by your health care provider.  °HOW IS MY PORT ACCESSED? °Special steps must be taken to access the port:  °· Before the port is accessed, a numbing cream can be placed on the skin. This helps numb the skin over the port site.   °· Your health care provider uses a sterile technique to access the port. °· Your health care provider must put on a mask and sterile gloves. °· The skin over your port is cleaned carefully with an antiseptic and allowed to dry. °· The port is gently pinched between sterile gloves, and a needle is inserted into the  port. °· Only "non-coring" port needles should be used to access the port. Once the port is accessed, a blood return should be checked. This helps ensure that the port is in the vein and is not clogged.   °· If your port needs to remain accessed for a constant infusion, a clear (transparent) bandage will be placed over the needle site. The bandage and needle will need to be changed every week, or as directed by your health care provider.   °· Keep the bandage covering the needle clean and dry. Do not get it wet. Follow your health care provider's instructions on how to take a shower or bath while the port is accessed.   °· If your port does not need to stay accessed, no bandage is needed over the port.   °WHAT IS FLUSHING? °Flushing helps keep the port from getting clogged. Follow your health care provider's instructions on how and when to flush the port. Ports are usually flushed with saline solution or a medicine called heparin. The need for flushing will depend on how the port is used.  °· If the port is used for intermittent medicines or blood draws, the port will need to be flushed:   °· After medicines have been given.   °· After blood has been drawn.   °· As part of routine maintenance.   °· If a constant infusion is running, the port may not need to be flushed.   °HOW LONG WILL MY PORT STAY IMPLANTED? °The port can stay in for as long as your health care provider thinks it is needed. When it is time for the port to come out, surgery will be done to remove it. The procedure is similar to the one performed when the port was put in.  °WHEN SHOULD I SEEK IMMEDIATE MEDICAL CARE? °When you have an implanted port, you should seek immediate medical care if:  °· You notice a bad smell coming from the incision site.   °· You have swelling, redness, or drainage at the incision site.   °· You have more swelling or pain at the port site or the surrounding area.   °· You have a fever that is not controlled with  medicine. °  °This information is not intended to replace advice given to you by your health care provider. Make sure you discuss any questions you have with your health care provider. °  °Document Released: 06/08/2005 Document Revised: 03/29/2013 Document Reviewed: 02/13/2013 °Elsevier Interactive Patient Education ©2016 [employer] °Moderate Conscious Sedation, Adult °Sedation is the use of medicines to promote relaxation and relieve discomfort and anxiety. Moderate conscious sedation is a type of sedation. Under moderate conscious sedation you are less alert than normal but are still able to respond to instructions or stimulation. Moderate conscious sedation is used during short medical and dental procedures. It is milder than deep sedation or general anesthesia and   allows you to return to your regular activities sooner. °LET YOUR HEALTH CARE PROVIDER KNOW ABOUT:  °· Any allergies you have. °· All medicines you are taking, including vitamins, herbs, eye drops, creams, and over-the-counter medicines. °· Use of steroids (by mouth or creams). °· Previous problems you or members of your family have had with the use of anesthetics. °· Any blood disorders you have. °· Previous surgeries you have had. °· Medical conditions you have. °· Possibility of pregnancy, if this applies. °· Use of cigarettes, alcohol, or illegal drugs. °RISKS AND COMPLICATIONS °Generally, this is a safe procedure. However, as with any procedure, problems can occur. Possible problems include: °· Oversedation. °· Trouble breathing on your own. You may need to have a breathing tube until you are awake and breathing on your own. °· Allergic reaction to any of the medicines used for the procedure. °BEFORE THE PROCEDURE °· You may have blood tests done. These tests can help show how well your kidneys and liver are working. They can also show how well your blood clots. °· A physical exam will be done.   °· Only take medicines as directed by your  health care provider. You may need to stop taking medicines (such as blood thinners, aspirin, or nonsteroidal anti-inflammatory drugs) before the procedure.   °· Do not eat or drink at least 6 hours before the procedure or as directed by your health care provider. °· Arrange for a responsible adult, family member, or friend to take you home after the procedure. He or she should stay with you for at least 24 hours after the procedure, until the medicine has worn off. °PROCEDURE  °· An intravenous (IV) catheter will be inserted into one of your veins. Medicine will be able to flow directly into your body through this catheter. You may be given medicine through this tube to help prevent pain and help you relax. °· The medical or dental procedure will be done. °AFTER THE PROCEDURE °· You will stay in a recovery area until the medicine has worn off. Your blood pressure and pulse will be checked.   °·  Depending on the procedure you had, you may be allowed to go home when you can tolerate liquids and your pain is under control. °  °This information is not intended to replace advice given to you by your health care provider. Make sure you discuss any questions you have with your health care provider. °  °Document Released: 03/03/2001 Document Revised: 06/29/2014 Document Reviewed: 02/13/2013 °Elsevier Interactive Patient Education ©2016 [employer] °Moderate Conscious Sedation, Adult, Care After °Refer to this sheet in the next few weeks. These instructions provide you with information on caring for yourself after your procedure. Your health care provider may also give you more specific instructions. Your treatment has been planned according to current medical practices, but problems sometimes occur. Call your health care provider if you have any problems or questions after your procedure. °WHAT TO EXPECT AFTER THE PROCEDURE  °After your procedure: °· You may feel sleepy, clumsy, and have poor balance for several  hours. °· Vomiting may occur if you eat too soon after the procedure. °HOME CARE INSTRUCTIONS °· Do not participate in any activities where you could become injured for at least 24 hours. Do not: °¨ Drive. °¨ Swim. °¨ Ride a bicycle. °¨ Operate heavy machinery. °¨ Cook. °¨ Use power tools. °¨ Climb ladders. °¨ Work from a high place. °· Do not make important decisions or sign legal documents until you are improved. °·   If you vomit, drink water, juice, or soup when you can drink without vomiting. Make sure you have little or no nausea before eating solid foods. °· Only take over-the-counter or prescription medicines for pain, discomfort, or fever as directed by your health care provider. °· Make sure you and your family fully understand everything about the medicines given to you, including what side effects may occur. °· You should not drink alcohol, take sleeping pills, or take medicines that cause drowsiness for at least 24 hours. °· If you smoke, do not smoke without supervision. °· If you are feeling better, you may resume normal activities 24 hours after you were sedated. °· Keep all appointments with your health care provider. °SEEK MEDICAL CARE IF: °· Your skin is pale or bluish in color. °· You continue to feel nauseous or vomit. °· Your pain is getting worse and is not helped by medicine. °· You have bleeding or swelling. °· You are still sleepy or feeling clumsy after 24 hours. °SEEK IMMEDIATE MEDICAL CARE IF: °· You develop a rash. °· You have difficulty breathing. °· You develop any type of allergic problem. °· You have a fever. °MAKE SURE YOU: °· Understand these instructions. °· Will watch your condition. °· Will get help right away if you are not doing well or get worse. °  °This information is not intended to replace advice given to you by your health care provider. Make sure you discuss any questions you have with your health care provider. °  °Document Released: 03/29/2013 Document Revised:  06/29/2014 Document Reviewed: 03/29/2013 °Elsevier Interactive Patient Education ©2016 [employer] Inc. ° °

## 2015-08-05 NOTE — Procedures (Signed)
Interventional Radiology Procedure Note  Procedure:  Right IJ portacath   Complications:  None  Estimated Blood Loss: < 10 mL  Single lumen port placed with catheter tip at cavoatrial junction.  OK to use.  Venetia Night. Kathlene Cote, M.D Pager:  [phone]

## 2015-08-14 ENCOUNTER — Ambulatory Visit (HOSPITAL_BASED_OUTPATIENT_CLINIC_OR_DEPARTMENT_OTHER): Payer: Medicare Other

## 2015-08-14 ENCOUNTER — Other Ambulatory Visit (HOSPITAL_BASED_OUTPATIENT_CLINIC_OR_DEPARTMENT_OTHER): Payer: Medicare Other

## 2015-08-14 VITALS — BP 163/78 | HR 80 | Temp 97.8°F | Resp 19

## 2015-08-14 DIAGNOSIS — C661 Malignant neoplasm of right ureter: Secondary | ICD-10-CM | POA: Diagnosis present

## 2015-08-14 DIAGNOSIS — Z5111 Encounter for antineoplastic chemotherapy: Secondary | ICD-10-CM

## 2015-08-14 DIAGNOSIS — C651 Malignant neoplasm of right renal pelvis: Secondary | ICD-10-CM

## 2015-08-14 LAB — CBC WITH DIFFERENTIAL/PLATELET
BASO%: 0.2 % (ref 0.0–2.0)
BASOS ABS: 0 10*3/uL (ref 0.0–0.1)
EOS%: 1.3 % (ref 0.0–7.0)
Eosinophils Absolute: 0.1 10*3/uL (ref 0.0–0.5)
HCT: 41.5 % (ref 38.4–49.9)
HGB: 14.1 g/dL (ref 13.0–17.1)
LYMPH#: 1.3 10*3/uL (ref 0.9–3.3)
LYMPH%: 15.2 % (ref 14.0–49.0)
MCH: 34.1 pg — AB (ref 27.2–33.4)
MCHC: 34 g/dL (ref 32.0–36.0)
MCV: 100.5 fL — ABNORMAL HIGH (ref 79.3–98.0)
MONO#: 0.7 10*3/uL (ref 0.1–0.9)
MONO%: 8.3 % (ref 0.0–14.0)
NEUT#: 6.5 10*3/uL (ref 1.5–6.5)
NEUT%: 75 % (ref 39.0–75.0)
Platelets: 224 10*3/uL (ref 140–400)
RBC: 4.13 10*6/uL — ABNORMAL LOW (ref 4.20–5.82)
RDW: 14 % (ref 11.0–14.6)
WBC: 8.6 10*3/uL (ref 4.0–10.3)

## 2015-08-14 LAB — COMPREHENSIVE METABOLIC PANEL
ALT: <9 U/L (ref 0–55)
AST: 17 U/L (ref 5–34)
Albumin: 3.4 g/dL — ABNORMAL LOW (ref 3.5–5.0)
Alkaline Phosphatase: 140 U/L (ref 40–150)
Anion Gap: 13 mEq/L — ABNORMAL HIGH (ref 3–11)
BUN: 12.1 mg/dL (ref 7.0–26.0)
CHLORIDE: 101 meq/L (ref 98–109)
CO2: 25 meq/L (ref 22–29)
Calcium: 9.6 mg/dL (ref 8.4–10.4)
Creatinine: 1 mg/dL (ref 0.7–1.3)
EGFR: 70 mL/min/{1.73_m2} — ABNORMAL LOW (ref >90)
GLUCOSE: 125 mg/dL (ref 70–140)
POTASSIUM: 3.9 meq/L (ref 3.5–5.1)
SODIUM: 139 meq/L (ref 136–145)
Total Bilirubin: 1.57 mg/dL — ABNORMAL HIGH (ref 0.20–1.20)
Total Protein: 8.3 g/dL (ref 6.4–8.3)

## 2015-08-14 MED ORDER — SODIUM CHLORIDE 0.9 % IV SOLN
10.0000 mg | Freq: Once | INTRAVENOUS | Status: AC
  Administered 2015-08-14: 10 mg via INTRAVENOUS
  Filled 2015-08-14: qty 1

## 2015-08-14 MED ORDER — HEPARIN SOD (PORK) LOCK FLUSH 100 UNIT/ML IV SOLN
500.0000 [IU] | Freq: Once | INTRAVENOUS | Status: AC | PRN
  Administered 2015-08-14: 500 [IU]
  Filled 2015-08-14: qty 5

## 2015-08-14 MED ORDER — PALONOSETRON HCL INJECTION 0.25 MG/5ML
0.2500 mg | Freq: Once | INTRAVENOUS | Status: AC
  Administered 2015-08-14: 0.25 mg via INTRAVENOUS

## 2015-08-14 MED ORDER — PALONOSETRON HCL INJECTION 0.25 MG/5ML
INTRAVENOUS | Status: AC
  Filled 2015-08-14: qty 5

## 2015-08-14 MED ORDER — GEMCITABINE HCL CHEMO INJECTION 1 GM/26.3ML
2000.0000 mg | Freq: Once | INTRAVENOUS | Status: AC
  Administered 2015-08-14: 2000 mg via INTRAVENOUS
  Filled 2015-08-14: qty 52.6

## 2015-08-14 MED ORDER — SODIUM CHLORIDE 0.9 % IV SOLN
470.0000 mg | Freq: Once | INTRAVENOUS | Status: AC
  Administered 2015-08-14: 470 mg via INTRAVENOUS
  Filled 2015-08-14: qty 47

## 2015-08-14 MED ORDER — SODIUM CHLORIDE 0.9% FLUSH
10.0000 mL | INTRAVENOUS | Status: DC | PRN
  Administered 2015-08-14: 10 mL
  Filled 2015-08-14: qty 10

## 2015-08-14 MED ORDER — SODIUM CHLORIDE 0.9 % IV SOLN
Freq: Once | INTRAVENOUS | Status: AC
Start: 2015-08-14 — End: 2015-08-14
  Administered 2015-08-14: 10:00:00 via INTRAVENOUS

## 2015-08-14 NOTE — Patient Instructions (Signed)
[facility] Discharge Instructions for Patients Receiving Chemotherapy  Today you received the following chemotherapy agents Gemzar/Carboplatin  To help prevent nausea and vomiting after your treatment, we encourage you to take your nausea medication     If you develop nausea and vomiting that is not controlled by your nausea medication, call the clinic.   BELOW ARE SYMPTOMS THAT SHOULD BE REPORTED IMMEDIATELY:  *FEVER GREATER THAN 100.5 F  *CHILLS WITH OR WITHOUT FEVER  NAUSEA AND VOMITING THAT IS NOT CONTROLLED WITH YOUR NAUSEA MEDICATION  *UNUSUAL SHORTNESS OF BREATH  *UNUSUAL BRUISING OR BLEEDING  TENDERNESS IN MOUTH AND THROAT WITH OR WITHOUT PRESENCE OF ULCERS  *URINARY PROBLEMS  *BOWEL PROBLEMS  UNUSUAL RASH Items with * indicate a potential emergency and should be followed up as soon as possible.  Feel free to call the clinic you have any questions or concerns. The clinic phone number is [phone].  Please show the [place] at check-in to the Emergency Department and triage nurse.   Carboplatin injection What is this medicine? CARBOPLATIN (KAR boe pla tin) is a chemotherapy drug. It targets fast dividing cells, like cancer cells, and causes these cells to die. This medicine is used to treat ovarian cancer and many other cancers. This medicine may be used for other purposes; ask your health care provider or pharmacist if you have questions. What should I tell my health care provider before I take this medicine? They need to know if you have any of these conditions: -blood disorders -hearing problems -kidney disease -recent or ongoing radiation therapy -an unusual or allergic reaction to carboplatin, cisplatin, other chemotherapy, other medicines, foods, dyes, or preservatives -pregnant or trying to get pregnant -breast-feeding How should I use this medicine? This drug is usually given as an infusion into a vein. It is administered in  a hospital or clinic by a specially trained health care professional. Talk to your pediatrician regarding the use of this medicine in children. Special care may be needed. Overdosage: If you think you have taken too much of this medicine contact a poison control center or emergency room at once. NOTE: This medicine is only for you. Do not share this medicine with others. What if I miss a dose? It is important not to miss a dose. Call your doctor or health care professional if you are unable to keep an appointment. What may interact with this medicine? -medicines for seizures -medicines to increase blood counts like filgrastim, pegfilgrastim, sargramostim -some antibiotics like amikacin, gentamicin, neomycin, streptomycin, tobramycin -vaccines Talk to your doctor or health care professional before taking any of these medicines: -acetaminophen -aspirin -ibuprofen -ketoprofen -naproxen This list may not describe all possible interactions. Give your health care provider a list of all the medicines, herbs, non-prescription drugs, or dietary supplements you use. Also tell them if you smoke, drink alcohol, or use illegal drugs. Some items may interact with your medicine. What should I watch for while using this medicine? Your condition will be monitored carefully while you are receiving this medicine. You will need important blood work done while you are taking this medicine. This drug may make you feel generally unwell. This is not uncommon, as chemotherapy can affect healthy cells as well as cancer cells. Report any side effects. Continue your course of treatment even though you feel ill unless your doctor tells you to stop. In some cases, you may be given additional medicines to help with side effects. Follow all directions for their use.  Call your doctor or health care professional for advice if you get a fever, chills or sore throat, or other symptoms of a cold or flu. Do not treat yourself. This  drug decreases your body's ability to fight infections. Try to avoid being around people who are sick. This medicine may increase your risk to bruise or bleed. Call your doctor or health care professional if you notice any unusual bleeding. Be careful brushing and flossing your teeth or using a toothpick because you may get an infection or bleed more easily. If you have any dental work done, tell your dentist you are receiving this medicine. Avoid taking products that contain aspirin, acetaminophen, ibuprofen, naproxen, or ketoprofen unless instructed by your doctor. These medicines may hide a fever. Do not become pregnant while taking this medicine. Women should inform their doctor if they wish to become pregnant or think they might be pregnant. There is a potential for serious side effects to an unborn child. Talk to your health care professional or pharmacist for more information. Do not breast-feed an infant while taking this medicine. What side effects may I notice from receiving this medicine? Side effects that you should report to your doctor or health care professional as soon as possible: -allergic reactions like skin rash, itching or hives, swelling of the face, lips, or tongue -signs of infection - fever or chills, cough, sore throat, pain or difficulty passing urine -signs of decreased platelets or bleeding - bruising, pinpoint red spots on the skin, black, tarry stools, nosebleeds -signs of decreased red blood cells - unusually weak or tired, fainting spells, lightheadedness -breathing problems -changes in hearing -changes in vision -chest pain -high blood pressure -low blood counts - This drug may decrease the number of white blood cells, red blood cells and platelets. You may be at increased risk for infections and bleeding. -nausea and vomiting -pain, swelling, redness or irritation at the injection site -pain, tingling, numbness in the hands or feet -problems with balance,  talking, walking -trouble passing urine or change in the amount of urine Side effects that usually do not require medical attention (report to your doctor or health care professional if they continue or are bothersome): -hair loss -loss of appetite -metallic taste in the mouth or changes in taste This list may not describe all possible side effects. Call your doctor for medical advice about side effects. You may report side effects to FDA at 1-800-FDA-1088. Where should I keep my medicine? This drug is given in a hospital or clinic and will not be stored at home. NOTE: This sheet is a summary. It may not cover all possible information. If you have questions about this medicine, talk to your doctor, pharmacist, or health care provider.    2016, [employer] Standard. (2007-09-13 14:38:05)  Gemcitabine injection What is this medicine? GEMCITABINE (jem SIT a been) is a chemotherapy drug. This medicine is used to treat many types of cancer like breast cancer, lung cancer, pancreatic cancer, and ovarian cancer. This medicine may be used for other purposes; ask your health care provider or pharmacist if you have questions. What should I tell my health care provider before I take this medicine? They need to know if you have any of these conditions: -blood disorders -infection -kidney disease -liver disease -recent or ongoing radiation therapy -an unusual or allergic reaction to gemcitabine, other chemotherapy, other medicines, foods, dyes, or preservatives -pregnant or trying to get pregnant -breast-feeding How should I use this medicine? This drug is given  as an infusion into a vein. It is administered in a hospital or clinic by a specially trained health care professional. Talk to your pediatrician regarding the use of this medicine in children. Special care may be needed. Overdosage: If you think you have taken too much of this medicine contact a poison control center or emergency room at  once. NOTE: This medicine is only for you. Do not share this medicine with others. What if I miss a dose? It is important not to miss your dose. Call your doctor or health care professional if you are unable to keep an appointment. What may interact with this medicine? -medicines to increase blood counts like filgrastim, pegfilgrastim, sargramostim -some other chemotherapy drugs like cisplatin -vaccines Talk to your doctor or health care professional before taking any of these medicines: -acetaminophen -aspirin -ibuprofen -ketoprofen -naproxen This list may not describe all possible interactions. Give your health care provider a list of all the medicines, herbs, non-prescription drugs, or dietary supplements you use. Also tell them if you smoke, drink alcohol, or use illegal drugs. Some items may interact with your medicine. What should I watch for while using this medicine? Visit your doctor for checks on your progress. This drug may make you feel generally unwell. This is not uncommon, as chemotherapy can affect healthy cells as well as cancer cells. Report any side effects. Continue your course of treatment even though you feel ill unless your doctor tells you to stop. In some cases, you may be given additional medicines to help with side effects. Follow all directions for their use. Call your doctor or health care professional for advice if you get a fever, chills or sore throat, or other symptoms of a cold or flu. Do not treat yourself. This drug decreases your body's ability to fight infections. Try to avoid being around people who are sick. This medicine may increase your risk to bruise or bleed. Call your doctor or health care professional if you notice any unusual bleeding. Be careful brushing and flossing your teeth or using a toothpick because you may get an infection or bleed more easily. If you have any dental work done, tell your dentist you are receiving this medicine. Avoid  taking products that contain aspirin, acetaminophen, ibuprofen, naproxen, or ketoprofen unless instructed by your doctor. These medicines may hide a fever. Women should inform their doctor if they wish to become pregnant or think they might be pregnant. There is a potential for serious side effects to an unborn child. Talk to your health care professional or pharmacist for more information. Do not breast-feed an infant while taking this medicine. What side effects may I notice from receiving this medicine? Side effects that you should report to your doctor or health care professional as soon as possible: -allergic reactions like skin rash, itching or hives, swelling of the face, lips, or tongue -low blood counts - this medicine may decrease the number of white blood cells, red blood cells and platelets. You may be at increased risk for infections and bleeding. -signs of infection - fever or chills, cough, sore throat, pain or difficulty passing urine -signs of decreased platelets or bleeding - bruising, pinpoint red spots on the skin, black, tarry stools, blood in the urine -signs of decreased red blood cells - unusually weak or tired, fainting spells, lightheadedness -breathing problems -chest pain -mouth sores -nausea and vomiting -pain, swelling, redness at site where injected -pain, tingling, numbness in the hands or feet -stomach pain -swelling of  ankles, feet, hands -unusual bleeding Side effects that usually do not require medical attention (report to your doctor or health care professional if they continue or are bothersome): -constipation -diarrhea -hair loss -loss of appetite -stomach upset This list may not describe all possible side effects. Call your doctor for medical advice about side effects. You may report side effects to FDA at 1-800-FDA-1088. Where should I keep my medicine? This drug is given in a hospital or clinic and will not be stored at home. NOTE: This sheet is a  summary. It may not cover all possible information. If you have questions about this medicine, talk to your doctor, pharmacist, or health care provider.    2016, [employer]. (2007-10-18 18:45:54)

## 2015-08-19 ENCOUNTER — Encounter: Payer: Self-pay | Admitting: *Deleted

## 2015-08-20 ENCOUNTER — Encounter: Payer: Self-pay | Admitting: Cardiovascular Disease

## 2015-08-20 ENCOUNTER — Ambulatory Visit (INDEPENDENT_AMBULATORY_CARE_PROVIDER_SITE_OTHER): Payer: Medicare Other | Admitting: Cardiovascular Disease

## 2015-08-20 VITALS — BP 112/50 | HR 80 | Ht 69.0 in | Wt 177.0 lb

## 2015-08-20 DIAGNOSIS — I10 Essential (primary) hypertension: Secondary | ICD-10-CM

## 2015-08-20 DIAGNOSIS — Z950 Presence of cardiac pacemaker: Secondary | ICD-10-CM

## 2015-08-20 DIAGNOSIS — I482 Chronic atrial fibrillation: Secondary | ICD-10-CM | POA: Diagnosis not present

## 2015-08-20 DIAGNOSIS — I4821 Permanent atrial fibrillation: Secondary | ICD-10-CM

## 2015-08-20 DIAGNOSIS — I350 Nonrheumatic aortic (valve) stenosis: Secondary | ICD-10-CM

## 2015-08-20 DIAGNOSIS — E785 Hyperlipidemia, unspecified: Secondary | ICD-10-CM

## 2015-08-20 MED ORDER — CLOPIDOGREL BISULFATE 75 MG PO TABS: 75.0000 mg | ORAL_TABLET | Freq: Every morning | ORAL | Status: DC

## 2015-08-20 NOTE — Patient Instructions (Signed)
Your physician has recommended you make the following change in your medication:   RESTART THE CLOPIDOGREL 75 MG DAILY  Your physician recommends that you return for lab work in: [place]  Remote monitoring is used to monitor your Pacemaker from home. This monitoring reduces the number of office visits required to check your device to one time per year. It allows [place] to monitor the functioning of your device to ensure it is working properly. You are scheduled for a device check from home on Nov 18, 2015. You may send your transmission at any time that day. If you have a wireless device, the transmission will be sent automatically. After your physician reviews your transmission, you will receive a postcard with your next transmission date.  Dr. Sallyanne Kuster recommends that you schedule a follow-up appointment in: [place] ([place])

## 2015-08-20 NOTE — Progress Notes (Signed)
Patient ID: [name], male   DOB: 1930-03-15, 82 y.o.   MRN: [id number]    Cardiology Office Note    Date:  08/21/2015   ID:  MAYS [name], DOB 11/07/1929, MRN [id number]  PCP:  [name], MD  Cardiologist:   Sanda Klein, MD   Chief Complaint  Patient presents with  . [name]  . Angioedema  . Chest Pain    History of Present Illness:  [name] is a 82 y.o. male with long-standing atrial fibrillation, now a permanent arrhythmia, history of pacemaker implanted in 1990 for sinus node dysfunction with a new right ventricular lead and generator change in 05/03/14, mild to moderate degenerative aortic stenosis, moderate mitral insufficiency, history of subdural hematoma, previous problems with diabetes mellitus, now diet-controlled.  Since his last appointment he has had several significant events. 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 He has had infrequent and brief episodes of hematuria.  Last 05-04-23 his stepson died unexpectedly and he developed what sounds like significant depression. He stopped of her single one of his medications including antiplatelet agents, statin and blood pressure medications. He has lost substantial weight. His blood pressure control is excellent although he is not taking any antihypertensives now. Contact his last saw blood pressure is low.  He has never had stroke but does have a reported history of transient ischemic attack. 2 of his sisters died of stroke. He does not take for anticoagulation due to previous subdural hematoma. He had been managed with clopidogrel only.  His pacemaker is a Engineer, site. It shows estimated generates a longevity of another 8.5 years. He has 65% ventricular pacing although he is not on any rate control medications. Her rare episodes of high  ventricular rates have been seen and these have been brief. They most likely represent atrial fibrillation rapid AV conduction.  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 He does not have any swelling of the left upper extremity.   Past Medical History  Diagnosis Date  . Atrial fibrillation ([place]) 1990  . Dyslipidemia   . Prostate hypertrophy   . Arthritis     degenerative-knees  . History of radiation therapy 05/05/07-06/15/07    right parotid through subclavicular region/ mid jugularlymph node chain  . Allergy   . Hypertension     labile  . Heart murmur     2/6 systolic  . History of chemotherapy      Hx carboplatin/43fu  . Xerostomia     limited  . Stroke [name]) 2003    TIA  . HOH (hard of hearing)   . Pacemaker     2003 vent paced , rate 69  . Chronic kidney disease     positive cytology  . Dry eyes   . GERD (gastroesophageal reflux disease)   . History of radiation therapy 05/05/07-06/15/07    Right parotid/hemineck,supraclavicular  . Coronary artery disease   . Urinary tract infection     hx of   . Bladder cancer ([place]) 2010  . Cancer ([place]) 05/05/07-06/15/07    parotid gland/66.4 GY  . Cancer ([place])     left eyelid   . Tinnitus     right   . Right groin hernia   .  Pneumonia     hx of   . Dysrhythmia     HX A FIB  . Skin cancer     HX BASAL CELL REMOVED  . Diabetes mellitus without complication ([place])     meds d/c  2-3 year ago    Past Surgical History  Procedure Laterality Date  . Pacemaker insertion  1990    last changed 2003  . Cystoscopy  01/2011    neg  . Back surgery    . Parotidectomy      right  . Craniotomy  12/14/2011    Procedure: CRANIOTOMY HEMATOMA EVACUATION SUBDURAL;  Surgeon: Ophelia Charter, MD;  Location: [facility];  Service: Neurosurgery;  Laterality: Left;  LEFT Craniotomy for subdural     . Neurofibroma excision  1980  . Cystoscopy with biopsy  04/01/2012    Procedure: CYSTOSCOPY WITH BIOPSY;  Surgeon: Claybon Jabs, MD;  Location: [facility];  Service: Urology;  Laterality: N/A;  BLADDER BIOPSIES and bladder washings  . Cystoscopy w/ retrogrades  04/01/2012    Procedure: CYSTOSCOPY WITH RETROGRADE PYELOGRAM;  Surgeon: Claybon Jabs, MD;  Location: [facility];  Service: Urology;  Laterality: Bilateral;  . Ureteroscopy  04/01/2012    Procedure: URETEROSCOPY;  Surgeon: Claybon Jabs, MD;  Location: [facility];  Service: Urology;  Laterality: Right;  . Cystoscopy/retrograde/ureteroscopy  05/02/2012    Procedure: CYSTOSCOPY/RETROGRADE/URETEROSCOPY;  Surgeon: Claybon Jabs, MD;  Location: [facility];  Service: Urology;  Laterality: Bilateral;  CYSTOSCOPY BILATERAL RETROGRADE PYLOGRAM BILATERAL URETEROSCOPY AND BIOPSY    . Cystoscopy with biopsy  05/02/2012    Procedure: CYSTOSCOPY WITH BIOPSY;  Surgeon: Claybon Jabs, MD;  Location: [facility];  Service: Urology;  Laterality: N/A;  . Cystoscopy with stent placement  05/02/2012    Procedure: CYSTOSCOPY WITH STENT PLACEMENT;  Surgeon: Claybon Jabs, MD;  Location: [facility];  Service: Urology;  Laterality: Bilateral;  . Pacemaker generator change  05/23/2002    [place], [place], serial [phone]  . Lower extremity arterial doppler  09/28/2011    No evidence of arterial insufficiency.   . Cardiovascular stress test  09/05/2009    Pharmalogical stress test w/o chest pain or EKG changes for ischemia  . Cardiac catheterization  09/20/2012    EF 50-55%, moderately calcified aortic valve leaflets, mild-moderate aortic valve stenosis, LA-appendage moderately dilated, moderate regurg of the mitral valve, RA moderately dilated  . Pacemaker generator change N/A 09/01/2013    Procedure: PACEMAKER GENERATOR CHANGE;  Surgeon:  Sanda Klein, MD;  Location: [facility];  Service: Cardiovascular;  Laterality: N/A;  . Cystoscopy/retrograde/ureteroscopy Bilateral 01/21/2015    Procedure: CYSTOSCOPY/RETROGRADE/ BLADDER BIOPSY;  Surgeon: Kathie Rhodes, MD;  Location: [facility];  Service: Urology;  Laterality: Bilateral;  . Cystoscopy with ureteroscopy and stent placement Right 02/15/2015    Procedure: CYSTOSCOPY WITH RIGHT URETEROSCOPY, RENAL PELVIC WASHINGS, STENT, RETROGRADE;  Surgeon: Kathie Rhodes, MD;  Location: [facility];  Service: Urology;  Laterality: Right;    Outpatient Prescriptions Prior to Visit  Medication Sig Dispense Refill  . acetaminophen (TYLENOL) 325 MG tablet Take 1-2 tablets (325-650 mg total) by mouth every 4 (four) hours as needed for mild pain. (Patient not taking: Reported on 08/20/2015)    . finasteride (PROSCAR) 5 MG tablet Take 5 mg by mouth daily as needed. Reported on 08/20/2015    . HYDROcodone-acetaminophen (NORCO) 10-325 MG per tablet Take 1-2 tablets  by mouth every 4 (four) hours as needed for moderate pain. Maximum dose per 24 hours - 8 pills (Patient not taking: Reported on 08/20/2015) 20 tablet 0  . pantoprazole (PROTONIX) 40 MG tablet Take 1 tablet (40 mg total) by mouth daily. (Patient not taking: Reported on 08/20/2015) 30 tablet 11  . phenazopyridine (PYRIDIUM) 200 MG tablet Take 1 tablet (200 mg total) by mouth 3 (three) times daily as needed for pain. (Patient not taking: Reported on 08/20/2015) 30 tablet 0  . prochlorperazine (COMPAZINE) 10 MG tablet Take 1 tablet (10 mg total) by mouth every 6 (six) hours as needed for nausea or vomiting. (Patient not taking: Reported on 08/20/2015) 30 tablet 0  . atorvastatin (LIPITOR) 40 MG tablet Take 40 mg by mouth every morning. Reported on 08/20/2015    . clopidogrel (PLAVIX) 75 MG tablet Take 75 mg by mouth every morning. Reported on 08/20/2015    . ramipril (ALTACE) 10 MG capsule Take 2 capsules (20 mg total) by mouth daily. (Patient not taking: Reported on  08/20/2015) 60 capsule 6   No facility-administered medications prior to visit.     Allergies:   Codeine and Docetaxel   Social History   Social History  . Marital Status: Widowed    Spouse Name: N/A  . Number of Children: N/A  . Years of Education: N/A   Social History Main Topics  . Smoking status: Never Smoker   . Smokeless tobacco: Never Used  . Alcohol Use: 10.5 oz/week    21 Standard drinks or equivalent per week     Comment: 3 /day( bourbon)  . Drug Use: No  . Sexual Activity: No   Other Topics Concern  . None   Social History Narrative     Family History:  The patient's family history includes Cancer in his father; Coronary artery disease in his son; Emphysema in his brother; Heart disease in his mother.   ROS:   Please see the history of present illness.    ROS All other systems reviewed and are negative.   PHYSICAL EXAM:   VS:  BP 112/50 mmHg  Pulse 80  Ht 5\' 9"  (1.753 m)  Wt 80.287 kg (177 lb)  BMI 26.13 kg/m2   GEN: Well nourished, well developed, in no acute distress HEENT: normal Neck: no JVD, carotid bruits, or masses Cardiac: irregular; a 2/6 early peaking aortic ejection murmur, no diastolic murmurs, rubs, or gallops,no edema ; left subclavian pacemaker site appears healthy, but there is substantial collateral venous circulation over left shoulder; has a Port-A-Cath in the right subclavian area Respiratory:  clear to auscultation bilaterally, normal work of breathing GI: soft, nontender, nondistended, + BS MS: no deformity or atrophy Skin: warm and dry, no rash Neuro:  Alert and Oriented x 3, Strength and sensation are intact Psych: euthymic mood, full affect  Wt Readings from Last 3 Encounters:  08/20/15 80.287 kg (177 lb)  07/30/15 86.093 kg (189 lb 12.8 oz)  02/15/15 89.812 kg (198 lb)      Studies/Labs Reviewed:   EKG:  EKG is ordered today.  The ekg ordered today demonstrates atrial fibrillation with ST depression and T-wave  inversion seen in leads 2, 3, F, V3-V6, QTC 480 ms.  Recent Labs: 08/14/2015: ALT <9; BUN 12.1; Creatinine 1.0; HGB 14.1; Platelets 224; Potassium 3.9; Sodium 139   Lipid Panel    Component Value Date/Time   CHOL 115 10/03/2011 0600   TRIG 63 10/03/2011 0600   HDL 55 10/03/2011 0600  CHOLHDL 2.1 10/03/2011 0600   VLDL 13 10/03/2011 0600   LDLCALC 47 10/03/2011 0600      ASSESSMENT:    1. Permanent atrial fibrillation ([place])   2. Hyperlipidemia   3. Aortic stenosis   4. Essential hypertension   5. Pacemaker      PLAN:  In order of problems listed above:  1. AFib: He has adequate ventricular rate control without any medications. He requires fairly frequent ventricular pacing actually. With a history of subdural hematoma and current problems with urothelial cancer and hematuria for anticoagulation appears out of the question, not withstanding his previous history of transient ischemic attack. I would like him to try to resume clopidogrel. If significant hematuria occurs will have to stop this agent as well. CHADSVasc score 3-6 (?TIA, age 75, +/-HTN, +/- DM). Both his hypertension and diabetes seem to have resolved, likely due to weight loss 2. HLP: No recent lipid profile. Before we restart a statin will check labs 3. AS: By physical exam the aortic stenosis remains non--severe. Symptoms of exertional dyspnea, angina or syncope 4. HTN: Normal/low BP off all agents 5. PPM: Normal device function. No changes made to settings. 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  Concerns regarding medicines are outlined above.  Medication changes, Labs and Tests ordered today are listed in the Patient Instructions below. Patient Instructions  Your physician  has recommended you make the following change in your medication:   RESTART THE CLOPIDOGREL 75 MG DAILY  Your physician recommends that you return for lab work in: [place]  Remote monitoring is used to monitor your Pacemaker from home. This monitoring reduces the number of office visits required to check your device to one time per year. It allows [place] to monitor the functioning of your device to ensure it is working properly. You are scheduled for a device check from home on Nov 18, 2015. You may send your transmission at any time that day. If you have a wireless device, the transmission will be sent automatically. After your physician reviews your transmission, you will receive a postcard with your next transmission date.  Dr. Sallyanne Kuster recommends that you schedule a follow-up appointment in: [place] ([place])           Mikael Spray, MD  08/21/2015 [facility] [place], [place], [place]  [address] Phone: [phone]; Fax: [phone]

## 2015-08-21 ENCOUNTER — Ambulatory Visit (HOSPITAL_BASED_OUTPATIENT_CLINIC_OR_DEPARTMENT_OTHER): Payer: Medicare Other | Admitting: Oncology

## 2015-08-21 ENCOUNTER — Ambulatory Visit (HOSPITAL_BASED_OUTPATIENT_CLINIC_OR_DEPARTMENT_OTHER): Payer: Medicare Other

## 2015-08-21 ENCOUNTER — Other Ambulatory Visit (HOSPITAL_BASED_OUTPATIENT_CLINIC_OR_DEPARTMENT_OTHER): Payer: Medicare Other

## 2015-08-21 ENCOUNTER — Telehealth: Payer: Self-pay | Admitting: *Deleted

## 2015-08-21 ENCOUNTER — Telehealth: Payer: Self-pay | Admitting: Oncology

## 2015-08-21 VITALS — BP 152/87 | HR 60 | Temp 98.2°F | Resp 17 | Ht 69.0 in | Wt 180.2 lb

## 2015-08-21 DIAGNOSIS — C661 Malignant neoplasm of right ureter: Secondary | ICD-10-CM

## 2015-08-21 DIAGNOSIS — Z5111 Encounter for antineoplastic chemotherapy: Secondary | ICD-10-CM | POA: Diagnosis present

## 2015-08-21 DIAGNOSIS — C651 Malignant neoplasm of right renal pelvis: Secondary | ICD-10-CM

## 2015-08-21 DIAGNOSIS — D696 Thrombocytopenia, unspecified: Secondary | ICD-10-CM | POA: Diagnosis not present

## 2015-08-21 DIAGNOSIS — C659 Malignant neoplasm of unspecified renal pelvis: Secondary | ICD-10-CM

## 2015-08-21 LAB — COMPREHENSIVE METABOLIC PANEL
ALT: 20 U/L (ref 0–55)
AST: 29 U/L (ref 5–34)
Albumin: 3.3 g/dL — ABNORMAL LOW (ref 3.5–5.0)
Alkaline Phosphatase: 133 U/L (ref 40–150)
Anion Gap: 11 mEq/L (ref 3–11)
BILIRUBIN TOTAL: 1.83 mg/dL — AB (ref 0.20–1.20)
BUN: 24.2 mg/dL (ref 7.0–26.0)
CHLORIDE: 97 meq/L — AB (ref 98–109)
CO2: 27 meq/L (ref 22–29)
CREATININE: 1.2 mg/dL (ref 0.7–1.3)
Calcium: 9.7 mg/dL (ref 8.4–10.4)
EGFR: 58 mL/min/{1.73_m2} — AB (ref >90)
GLUCOSE: 126 mg/dL (ref 70–140)
Potassium: 4.4 mEq/L (ref 3.5–5.1)
SODIUM: 135 meq/L — AB (ref 136–145)
TOTAL PROTEIN: 8 g/dL (ref 6.4–8.3)

## 2015-08-21 LAB — CBC WITH DIFFERENTIAL/PLATELET
BASO%: 0.5 % (ref 0.0–2.0)
BASOS ABS: 0 10*3/uL (ref 0.0–0.1)
EOS ABS: 0 10*3/uL (ref 0.0–0.5)
EOS%: 0.8 % (ref 0.0–7.0)
HCT: 38 % — ABNORMAL LOW (ref 38.4–49.9)
HGB: 13.3 g/dL (ref 13.0–17.1)
LYMPH#: 1.2 10*3/uL (ref 0.9–3.3)
LYMPH%: 30.6 % (ref 14.0–49.0)
MCH: 34.4 pg — ABNORMAL HIGH (ref 27.2–33.4)
MCHC: 35 g/dL (ref 32.0–36.0)
MCV: 98.2 fL — ABNORMAL HIGH (ref 79.3–98.0)
MONO#: 0.1 10*3/uL (ref 0.1–0.9)
MONO%: 1.5 % (ref 0.0–14.0)
NEUT%: 66.6 % (ref 39.0–75.0)
NEUTROS ABS: 2.6 10*3/uL (ref 1.5–6.5)
NRBC: 0 % (ref 0–0)
PLATELETS: 84 10*3/uL — AB (ref 140–400)
RBC: 3.87 10*6/uL — AB (ref 4.20–5.82)
RDW: 13.3 % (ref 11.0–14.6)
WBC: 4 10*3/uL (ref 4.0–10.3)

## 2015-08-21 MED ORDER — PROCHLORPERAZINE MALEATE 10 MG PO TABS
ORAL_TABLET | ORAL | Status: AC
  Filled 2015-08-21: qty 1

## 2015-08-21 MED ORDER — HEPARIN SOD (PORK) LOCK FLUSH 100 UNIT/ML IV SOLN
500.0000 [IU] | Freq: Once | INTRAVENOUS | Status: AC | PRN
  Administered 2015-08-21: 500 [IU]
  Filled 2015-08-21: qty 5

## 2015-08-21 MED ORDER — SODIUM CHLORIDE 0.9 % IV SOLN
Freq: Once | INTRAVENOUS | Status: AC
  Administered 2015-08-21: 11:00:00 via INTRAVENOUS

## 2015-08-21 MED ORDER — SODIUM CHLORIDE 0.9% FLUSH
10.0000 mL | INTRAVENOUS | Status: DC | PRN
  Administered 2015-08-21: 10 mL
  Filled 2015-08-21: qty 10

## 2015-08-21 MED ORDER — PROCHLORPERAZINE MALEATE 10 MG PO TABS
10.0000 mg | ORAL_TABLET | Freq: Once | ORAL | Status: AC
  Administered 2015-08-21: 10 mg via ORAL

## 2015-08-21 MED ORDER — SODIUM CHLORIDE 0.9 % IV SOLN
2000.0000 mg | Freq: Once | INTRAVENOUS | Status: AC
  Administered 2015-08-21: 2000 mg via INTRAVENOUS
  Filled 2015-08-21: qty 52.6

## 2015-08-21 NOTE — Telephone Encounter (Signed)
Per staff message and POF I have scheduled appts. Advised scheduler of appts. JMW  

## 2015-08-21 NOTE — Telephone Encounter (Signed)
perp of to sch pt appt-gave pt copy of avs-sent MW email to [facility] trmt-pt to get updated copy b4 leaving

## 2015-08-21 NOTE — Patient Instructions (Signed)
[facility] Discharge Instructions for Patients Receiving Chemotherapy  Today you received the following chemotherapy agents Gemzar  To help prevent nausea and vomiting after your treatment, we encourage you to take your nausea medication as prescribed   If you develop nausea and vomiting that is not controlled by your nausea medication, call the clinic.   BELOW ARE SYMPTOMS THAT SHOULD BE REPORTED IMMEDIATELY:  *FEVER GREATER THAN 100.5 F  *CHILLS WITH OR WITHOUT FEVER  NAUSEA AND VOMITING THAT IS NOT CONTROLLED WITH YOUR NAUSEA MEDICATION  *UNUSUAL SHORTNESS OF BREATH  *UNUSUAL BRUISING OR BLEEDING  TENDERNESS IN MOUTH AND THROAT WITH OR WITHOUT PRESENCE OF ULCERS  *URINARY PROBLEMS  *BOWEL PROBLEMS  UNUSUAL RASH Items with * indicate a potential emergency and should be followed up as soon as possible.  Feel free to call the clinic you have any questions or concerns. The clinic phone number is [phone].  Please show the CHEMO ALERT CARD at check-in to the Emergency Department and triage nurse.   

## 2015-08-21 NOTE — Progress Notes (Signed)
Hematology and Oncology Follow Up Visit  [name] [id number] 03-Jun-1930 82 y.o. 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 CT scan in January 2017 showed pelvic adenopathy indicating metastatic disease.   Prior Therapy: He is status post cystoscopy and right double-J stent placement in August 2016.  Current therapy: Palliative chemotherapy utilizing carboplatin and gemcitabine cycle one given on 08/14/2015. He is here for day 8 cycle 1 of therapy.  Interim History:  [name] presents today for a follow-up visit. Since the last visit, he underwent a Port-A-Cath insertion without any complication. He received day 1 of cycle 1 of chemotherapy and tolerated it well. He did report a low-grade fevers associated with gemcitabine but no other complaints. He did not have any nausea, vomiting or abdominal pain. He still have some urinary symptoms are chronic in nature including frequency and nocturia. He does have lower abdominal distention which is not changed dramatically. He is able to eat and have gained more weight since last visit.  He does not report any headaches, blurry vision, syncope or seizures. He does not report any fevers, chills or sweats. Does not report any appetite changes or weight loss. Does not report any chest pain, palpitation, orthopnea but does report leg edema. He does not report any cough, wheezing or hemoptysis. Does not report any nausea, vomiting, constipation or diarrhea. He does not report any skeletal complaints of arthralgias or myalgias. Does not report any lymphadenopathy or petechiae. Does not report any mood issues. Remaining review of systems unremarkable.   Medications: I have reviewed the patient's current medications.  Current Outpatient Prescriptions  Medication Sig Dispense Refill  .  acetaminophen (TYLENOL) 325 MG tablet Take 1-2 tablets (325-650 mg total) by mouth every 4 (four) hours as needed for mild pain. (Patient not taking: Reported on 08/20/2015)    . clopidogrel (PLAVIX) 75 MG tablet Take 1 tablet (75 mg total) by mouth every morning. Reported on 08/20/2015 90 tablet 3  . finasteride (PROSCAR) 5 MG tablet Take 5 mg by mouth daily as needed. Reported on 08/20/2015    . HYDROcodone-acetaminophen (NORCO) 10-325 MG per tablet Take 1-2 tablets by mouth every 4 (four) hours as needed for moderate pain. Maximum dose per 24 hours - 8 pills (Patient not taking: Reported on 08/20/2015) 20 tablet 0  . pantoprazole (PROTONIX) 40 MG tablet Take 1 tablet (40 mg total) by mouth daily. (Patient not taking: Reported on 08/20/2015) 30 tablet 11  . phenazopyridine (PYRIDIUM) 200 MG tablet Take 1 tablet (200 mg total) by mouth 3 (three) times daily as needed for pain. (Patient not taking: Reported on 08/20/2015) 30 tablet 0  . prochlorperazine (COMPAZINE) 10 MG tablet Take 1 tablet (10 mg total) by mouth every 6 (six) hours as needed for nausea or vomiting. (Patient not taking: Reported on 08/20/2015) 30 tablet 0   No current facility-administered medications for this visit.     Allergies:  Allergies  Allergen Reactions  . Codeine Other (See Comments)    Blisters between fingers  . Docetaxel Rash    Past Medical History, Surgical history, Social history, and Family History were reviewed and updated.   Physical Exam: Blood pressure 152/87, pulse 60, temperature 98.2 F (36.8 C), temperature source Oral, resp. rate 17, height 5\' 9"  (1.753 m), weight 180 lb 3.2 oz (81.738 kg), SpO2 100 %. ECOG: 1 General appearance:  alert and cooperative. Not in any distress. Head: Normocephalic, without obvious abnormality is or lesions. Neck: no adenopathy Lymph nodes: Cervical, supraclavicular, and axillary nodes normal. Heart:regular rate and rhythm, S1, S2 normal, no murmur, click, rub or  gallop Lung:chest clear, no wheezing, rales, normal symmetric air entry Abdomin: soft, non-tender, without masses or organomegaly no shifting dose of ascites. EXT:no erythema, induration, or nodules   Lab Results: Lab Results  Component Value Date   WBC 4.0 08/21/2015   HGB 13.3 08/21/2015   HCT 38.0* 08/21/2015   MCV 98.2* 08/21/2015   PLT 84* 08/21/2015     Chemistry      Component Value Date/Time   NA 139 08/14/2015 0941   NA 139 08/05/2015 1200   K 3.9 08/14/2015 0941   K 3.3* 08/05/2015 1200   CL 100* 08/05/2015 1200   CO2 25 08/14/2015 0941   CO2 27 08/05/2015 1200   BUN 12.1 08/14/2015 0941   BUN 11 08/05/2015 1200   CREATININE 1.0 08/14/2015 0941   CREATININE 0.92 08/05/2015 1200   CREATININE 0.92 08/29/2013 1519      Component Value Date/Time   CALCIUM 9.6 08/14/2015 0941   CALCIUM 9.3 08/05/2015 1200   ALKPHOS 140 08/14/2015 0941   ALKPHOS 99 01/16/2015 1445   AST 17 08/14/2015 0941   AST 22 01/16/2015 1445   ALT <9 08/14/2015 0941   ALT 12* 01/16/2015 1445   BILITOT 1.57* 08/14/2015 0941   BILITOT 1.8* 01/16/2015 1445       Impression and Plan:   82 year old gentleman with the following issues:  1. 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 CT scan in January 2017 showed pelvic adenopathy indicating metastatic disease.  He is currently receiving salvage chemotherapy with carboplatin and gemcitabine. He is here to proceed with day 8 of cycle 1 chemotherapy without any dose reduction or delay. The plan is to proceed with 3 cycles of chemotherapy and repeat imaging studies.  2. IV access: Port-A-Cath inserted without any complications. EMLA creams available to the patient.  3. Nausea prophylaxis: Compazine is available to the patient.  4. Thrombocytopenia: Mild at this time and related to chemotherapy. He has no active bleeding and bleeding percussion were  discussed with the patient. We will proceed with chemotherapy today but potentially could hold day 8 of subsequent cycles of chemotherapy becomes an issue.  5. Follow-up: Will be in 2 weeks for the start of cycle 2 of therapy.  [facility], MD 3/1/201710:28 AM

## 2015-08-21 NOTE — Progress Notes (Signed)
Per Dr. Alen Blew, it's OK to treat today with platelet count of 84.

## 2015-08-27 ENCOUNTER — Inpatient Hospital Stay (HOSPITAL_COMMUNITY)
Admission: EM | Admit: 2015-08-27 | Discharge: 2015-09-03 | DRG: 871 | Disposition: A | Discharge status: Skilled Nursing Facility | Payer: Medicare Other | Attending: Internal Medicine | Admitting: Internal Medicine

## 2015-08-27 ENCOUNTER — Emergency Department (HOSPITAL_COMMUNITY): Payer: Medicare Other

## 2015-08-27 ENCOUNTER — Encounter (HOSPITAL_COMMUNITY): Payer: Self-pay | Admitting: *Deleted

## 2015-08-27 DIAGNOSIS — H9190 Unspecified hearing loss, unspecified ear: Secondary | ICD-10-CM | POA: Diagnosis present

## 2015-08-27 DIAGNOSIS — E440 Moderate protein-calorie malnutrition: Secondary | ICD-10-CM | POA: Diagnosis present

## 2015-08-27 DIAGNOSIS — C659 Malignant neoplasm of unspecified renal pelvis: Secondary | ICD-10-CM | POA: Diagnosis present

## 2015-08-27 DIAGNOSIS — T451X5A Adverse effect of antineoplastic and immunosuppressive drugs, initial encounter: Secondary | ICD-10-CM | POA: Diagnosis present

## 2015-08-27 DIAGNOSIS — Z9221 Personal history of antineoplastic chemotherapy: Secondary | ICD-10-CM | POA: Diagnosis not present

## 2015-08-27 DIAGNOSIS — Z8249 Family history of ischemic heart disease and other diseases of the circulatory system: Secondary | ICD-10-CM | POA: Diagnosis not present

## 2015-08-27 DIAGNOSIS — R319 Hematuria, unspecified: Secondary | ICD-10-CM | POA: Diagnosis present

## 2015-08-27 DIAGNOSIS — Z950 Presence of cardiac pacemaker: Secondary | ICD-10-CM | POA: Diagnosis not present

## 2015-08-27 DIAGNOSIS — D61810 Antineoplastic chemotherapy induced pancytopenia: Secondary | ICD-10-CM | POA: Diagnosis present

## 2015-08-27 DIAGNOSIS — I2510 Atherosclerotic heart disease of native coronary artery without angina pectoris: Secondary | ICD-10-CM | POA: Diagnosis present

## 2015-08-27 DIAGNOSIS — N390 Urinary tract infection, site not specified: Secondary | ICD-10-CM | POA: Diagnosis present

## 2015-08-27 DIAGNOSIS — I10 Essential (primary) hypertension: Secondary | ICD-10-CM | POA: Diagnosis present

## 2015-08-27 DIAGNOSIS — E119 Type 2 diabetes mellitus without complications: Secondary | ICD-10-CM | POA: Diagnosis present

## 2015-08-27 DIAGNOSIS — D696 Thrombocytopenia, unspecified: Secondary | ICD-10-CM

## 2015-08-27 DIAGNOSIS — D61818 Other pancytopenia: Secondary | ICD-10-CM | POA: Diagnosis present

## 2015-08-27 DIAGNOSIS — D709 Neutropenia, unspecified: Secondary | ICD-10-CM | POA: Insufficient documentation

## 2015-08-27 DIAGNOSIS — Z923 Personal history of irradiation: Secondary | ICD-10-CM | POA: Diagnosis not present

## 2015-08-27 DIAGNOSIS — Z7902 Long term (current) use of antithrombotics/antiplatelets: Secondary | ICD-10-CM

## 2015-08-27 DIAGNOSIS — R509 Fever, unspecified: Secondary | ICD-10-CM | POA: Diagnosis present

## 2015-08-27 DIAGNOSIS — Z6826 Body mass index (BMI) 26.0-26.9, adult: Secondary | ICD-10-CM

## 2015-08-27 DIAGNOSIS — R5081 Fever presenting with conditions classified elsewhere: Secondary | ICD-10-CM | POA: Diagnosis present

## 2015-08-27 DIAGNOSIS — D6959 Other secondary thrombocytopenia: Secondary | ICD-10-CM | POA: Diagnosis present

## 2015-08-27 DIAGNOSIS — A419 Sepsis, unspecified organism: Principal | ICD-10-CM | POA: Diagnosis present

## 2015-08-27 DIAGNOSIS — B9620 Unspecified Escherichia coli [E. coli] as the cause of diseases classified elsewhere: Secondary | ICD-10-CM | POA: Diagnosis present

## 2015-08-27 DIAGNOSIS — C661 Malignant neoplasm of right ureter: Secondary | ICD-10-CM | POA: Diagnosis present

## 2015-08-27 LAB — CBC WITH DIFFERENTIAL/PLATELET
BASOS PCT: 0 %
Basophils Absolute: 0 10*3/uL (ref 0.0–0.1)
EOS ABS: 0 10*3/uL (ref 0.0–0.7)
Eosinophils Relative: 0 %
HEMATOCRIT: 29 % — AB (ref 39.0–52.0)
HEMOGLOBIN: 10.1 g/dL — AB (ref 13.0–17.0)
LYMPHS ABS: 0.3 10*3/uL — AB (ref 0.7–4.0)
Lymphocytes Relative: 42 %
MCH: 34 pg (ref 26.0–34.0)
MCHC: 34.8 g/dL (ref 30.0–36.0)
MCV: 97.6 fL (ref 78.0–100.0)
MONO ABS: 0 10*3/uL — AB (ref 0.1–1.0)
Monocytes Relative: 3 %
NEUTROS ABS: 0.4 10*3/uL — AB (ref 1.7–7.7)
Neutrophils Relative %: 55 %
Platelets: 8 10*3/uL — CL (ref 150–400)
RBC: 2.97 MIL/uL — AB (ref 4.22–5.81)
RDW: 12.6 % (ref 11.5–15.5)
WBC: 0.7 10*3/uL — CL (ref 4.0–10.5)

## 2015-08-27 LAB — COMPREHENSIVE METABOLIC PANEL
ALBUMIN: 3.2 g/dL — AB (ref 3.5–5.0)
ALK PHOS: 116 U/L (ref 38–126)
ALT: 17 U/L (ref 17–63)
ANION GAP: 13 (ref 5–15)
AST: 28 U/L (ref 15–41)
BUN: 26 mg/dL — ABNORMAL HIGH (ref 6–20)
CALCIUM: 9.3 mg/dL (ref 8.9–10.3)
CO2: 23 mmol/L (ref 22–32)
Chloride: 102 mmol/L (ref 101–111)
Creatinine, Ser: 1.17 mg/dL (ref 0.61–1.24)
GFR calc Af Amer: >60 mL/min (ref >60)
GFR calc non Af Amer: 56 mL/min — ABNORMAL LOW (ref >60)
GLUCOSE: 173 mg/dL — AB (ref 65–99)
Potassium: 4.5 mmol/L (ref 3.5–5.1)
SODIUM: 138 mmol/L (ref 135–145)
Total Bilirubin: 1.5 mg/dL — ABNORMAL HIGH (ref 0.3–1.2)
Total Protein: 6.9 g/dL (ref 6.5–8.1)

## 2015-08-27 LAB — URINALYSIS, ROUTINE W REFLEX MICROSCOPIC
Bilirubin Urine: NEGATIVE
GLUCOSE, UA: NEGATIVE mg/dL
KETONES UR: NEGATIVE mg/dL
Nitrite: NEGATIVE
PH: 6.5 (ref 5.0–8.0)
Protein, ur: >300 mg/dL — AB
SPECIFIC GRAVITY, URINE: 1.019 (ref 1.005–1.030)

## 2015-08-27 LAB — URINE MICROSCOPIC-ADD ON

## 2015-08-27 LAB — PROTIME-INR
INR: 1.01 (ref 0.00–1.49)
Prothrombin Time: 13.5 seconds (ref 11.6–15.2)

## 2015-08-27 LAB — TYPE AND SCREEN
ABO/RH(D): AB POS
ANTIBODY SCREEN: NEGATIVE

## 2015-08-27 LAB — I-STAT CG4 LACTIC ACID, ED
LACTIC ACID, VENOUS: 2.93 mmol/L — AB (ref 0.5–2.0)
LACTIC ACID, VENOUS: 0.99 mmol/L (ref 0.5–2.0)

## 2015-08-27 MED ORDER — FENTANYL CITRATE (PF) 100 MCG/2ML IJ SOLN
50.0000 ug | Freq: Once | INTRAMUSCULAR | Status: AC
  Administered 2015-08-27: 50 ug via INTRAVENOUS
  Filled 2015-08-27: qty 2

## 2015-08-27 MED ORDER — PIPERACILLIN-TAZOBACTAM 3.375 G IVPB 30 MIN
3.3750 g | Freq: Once | INTRAVENOUS | Status: AC
Start: 2015-08-27 — End: 2015-08-27
  Administered 2015-08-27: 3.375 g via INTRAVENOUS
  Filled 2015-08-27: qty 50

## 2015-08-27 MED ORDER — DEXTROSE 5 % IV SOLN
1.0000 g | INTRAVENOUS | Status: DC
  Filled 2015-08-27: qty 10

## 2015-08-27 MED ORDER — SODIUM CHLORIDE 0.9 % IV BOLUS (SEPSIS)
1000.0000 mL | Freq: Once | INTRAVENOUS | Status: AC
  Administered 2015-08-27: 1000 mL via INTRAVENOUS

## 2015-08-27 MED ORDER — ACETAMINOPHEN 325 MG PO TABS
650.0000 mg | ORAL_TABLET | Freq: Once | ORAL | Status: AC
  Administered 2015-08-27: 650 mg via ORAL
  Filled 2015-08-27: qty 2

## 2015-08-27 MED ORDER — DEXTROSE 5 % IV SOLN
1.0000 g | Freq: Once | INTRAVENOUS | Status: AC
  Administered 2015-08-27: 1 g via INTRAVENOUS
  Filled 2015-08-27: qty 10

## 2015-08-27 MED ORDER — SODIUM CHLORIDE 0.9 % IV SOLN
10.0000 mL/h | Freq: Once | INTRAVENOUS | Status: AC
  Administered 2015-08-27: 10 mL/h via INTRAVENOUS

## 2015-08-27 MED ORDER — VANCOMYCIN HCL IN DEXTROSE 1-5 GM/200ML-% IV SOLN
1000.0000 mg | Freq: Once | INTRAVENOUS | Status: AC
  Administered 2015-08-27: 1000 mg via INTRAVENOUS
  Filled 2015-08-27: qty 200

## 2015-08-27 MED ORDER — FENTANYL CITRATE (PF) 100 MCG/2ML IJ SOLN
50.0000 ug | INTRAMUSCULAR | Status: DC | PRN
  Administered 2015-08-27 – 2015-09-02 (×14): 50 ug via INTRAVENOUS
  Filled 2015-08-27 (×14): qty 2

## 2015-08-27 NOTE — ED Notes (Signed)
Judeen Hammans (Daughter): [phone] (home)  [phone] (cell)

## 2015-08-27 NOTE — ED Notes (Signed)
Per [facility], pt from home, hx of hematuria d/t hx of kidney & bladder cancer, is non-ambulatory.

## 2015-08-27 NOTE — ED Notes (Signed)
EDP Ralene Bathe made aware of lactic acid of 2.93

## 2015-08-27 NOTE — ED Notes (Signed)
EDP Ralene Bathe

## 2015-08-27 NOTE — ED Provider Notes (Signed)
CSN: [id number]     Arrival date & time 08/27/15  1750 History   First MD Initiated Contact with Patient 08/27/15 1841     Chief Complaint  Patient presents with  . Hematuria  . Fever     The history is provided by the patient and a relative. No language interpreter was used.   [name] is a 82 y.o. male who presents to the Emergency Department complaining of fever, hematuria.  History is provided by the patient and his daughter. He has been feeling poorly since Sunday with intermittent hematuria, difficulty urinating, decreased urinary output. Today he developed fever and shaking chills just prior to ED arrival. He has had a rash on his chest, abdomen, groin since Sunday. He denies any cough, chest pain, abdominal pain, vomiting, diarrhea, hematochezia.  Past Medical History  Diagnosis Date  . Atrial fibrillation ([name] 1990  . Dyslipidemia   . Prostate hypertrophy   . Arthritis     degenerative-knees  . History of radiation therapy 05/05/07-06/15/07    right parotid through subclavicular region/ mid jugularlymph node chain  . Allergy   . Hypertension     labile  . Heart murmur     2/6 systolic  . History of chemotherapy      Hx carboplatin/5fu  . Xerostomia     limited  . Stroke ([name] 2003    TIA  . HOH (hard of hearing)   . Pacemaker     20 03 vent paced , rate 69  . Chronic kidney disease     positive cytology  . Dry eyes   . GERD (gastroesophageal reflux disease)   . History of radiation therapy 05/05/07-06/15/07    Right parotid/hemineck,supraclavicular  . Coronary artery disease   . Urinary tract infection     hx of   . Bladder cancer ([place]) 2010  . Cancer ([place]) 05/05/07-06/15/07    parotid gland/66.4 GY  . Cancer ([place])     left eyelid   . Tinnitus     right   . Right groin hernia   . Pneumonia     hx of   . Dysrhythmia     HX A FIB  . Skin cancer     HX BASAL CELL REMOVED  . Diabetes mellitus without complication ([place])     meds d/c  2-3 year ago    Past Surgical History  Procedure Laterality Date  . Pacemaker insertion  1990    last changed 2003  . Cystoscopy  01/2011    neg  . Back surgery    . Parotidectomy      right  . Craniotomy  12/14/2011    Procedure: CRANIOTOMY HEMATOMA EVACUATION SUBDURAL;  Surgeon: Ophelia Charter, MD;  Location: [place] [facility];  Service: Neurosurgery;  Laterality: Left;  LEFT Craniotomy for subdural   . Neurofibroma excision  1980  . Cystoscopy with biopsy  04/01/2012    Procedure: CYSTOSCOPY WITH BIOPSY;  Surgeon: Claybon Jabs, MD;  Location: [facility];  Service: Urology;  Laterality: N/A;  BLADDER BIOPSIES and bladder washings  . Cystoscopy w/ retrogrades  04/01/2012    Procedure: CYSTOSCOPY WITH RETROGRADE PYELOGRAM;  Surgeon: Claybon Jabs, MD;  Location: [facility];  Service: Urology;  Laterality: Bilateral;  . Ureteroscopy  04/01/2012    Procedure: URETEROSCOPY;  Surgeon: Claybon Jabs, MD;  Location: [facility];  Service: Urology;  Laterality: Right;  . Cystoscopy/retrograde/ureteroscopy  05/02/2012    Procedure:  CYSTOSCOPY/RETROGRADE/URETEROSCOPY;  Surgeon: Claybon Jabs, MD;  Location[facility];  Service: Urology;  Laterality: Bilateral;  CYSTOSCOPY BILATERAL RETROGRADE PYLOGRAM BILATERAL URETEROSCOPY AND BIOPSY    . Cystoscopy with biopsy  05/02/2012    Procedure: CYSTOSCOPY WITH BIOPSY;  Surgeon: Claybon Jabs, MD;  Location: [facility];  Service: Urology;  Laterality: N/A;  . Cystoscopy with stent placement  05/02/2012    Procedure: CYSTOSCOPY WITH STENT PLACEMENT;  Surgeon: Claybon Jabs, MD;  Location: [facility];  Service: Urology;  Laterality: Bilateral;  . Pacemaker generator change  05/23/2002    [place], [place], serial [phone]  . Lower extremity arterial doppler  09/28/2011    No evidence of arterial insufficiency.   . Cardiovascular stress test  09/05/2009     Pharmalogical stress test w/o chest pain or EKG changes for ischemia  . Cardiac catheterization  09/20/2012    EF 50-55%, moderately calcified aortic valve leaflets, mild-moderate aortic valve stenosis, LA-appendage moderately dilated, moderate regurg of the mitral valve, RA moderately dilated  . Pacemaker generator change N/A 09/01/2013    Procedure: PACEMAKER GENERATOR CHANGE;  Surgeon: Sanda Klein, MD;  Location: [place] [facility];  Service: Cardiovascular;  Laterality: N/A;  . Cystoscopy/retrograde/ureteroscopy Bilateral 01/21/2015    Procedure: CYSTOSCOPY/RETROGRADE/ BLADDER BIOPSY;  Surgeon: Kathie Rhodes, MD;  [facility];  Service: Urology;  Laterality: Bilateral;  . Cystoscopy with ureteroscopy and stent placement Right 02/15/2015    Procedure: CYSTOSCOPY WITH RIGHT URETEROSCOPY, RENAL PELVIC WASHINGS, STENT, RETROGRADE;  Surgeon: Kathie Rhodes, MD;  [facility];  Service: Urology;  Laterality: Right;   Family History  Problem Relation Age of Onset  . Heart disease Mother   . Cancer Father     Lung cancer  . Emphysema Brother   . Coronary artery disease Son    Social History  Substance Use Topics  . Smoking status: Never Smoker   . Smokeless tobacco: Never Used  . Alcohol Use: 10.5 oz/week    21 Standard drinks or equivalent per week     Comment: 3 /day( bourbon)    Review of Systems  All other systems reviewed and are negative.     Allergies  Codeine and Docetaxel  Home Medications   Prior to Admission medications   Medication Sig Start Date End Date Taking? Authorizing Provider  acetaminophen (TYLENOL) 325 MG tablet Take 1-2 tablets (325-650 mg total) by mouth every 4 (four) hours as needed for mild pain. 09/02/13  Yes Lendon Colonel, NP  ibuprofen (ADVIL,MOTRIN) 200 MG tablet Take 400 mg by mouth every 6 (six) hours as needed for fever.   Yes Historical Provider, MD  clopidogrel (PLAVIX) 75 MG tablet Take 1 tablet (75 mg total) by mouth every morning.  Reported on 08/20/2015 Patient not taking: Reported on 08/27/2015 08/20/15   Sanda Klein, MD  HYDROcodone-acetaminophen (NORCO) 10-325 MG per tablet Take 1-2 tablets by mouth every 4 (four) hours as needed for moderate pain. Maximum dose per 24 hours - 8 pills Patient not taking: Reported on 08/20/2015 02/15/15   Kathie Rhodes, MD  pantoprazole (PROTONIX) 40 MG tablet Take 1 tablet (40 mg total) by mouth daily. Patient not taking: Reported on 08/20/2015 07/17/14   Brett Canales, PA-C  phenazopyridine (PYRIDIUM) 200 MG tablet Take 1 tablet (200 mg total) by mouth 3 (three) times daily as needed for pain. Patient not taking: Reported on 08/20/2015 01/21/15   Kathie Rhodes, MD  prochlorperazine (COMPAZINE) 10 MG  tablet Take 1 tablet (10 mg total) by mouth every 6 (six) hours as needed for nausea or vomiting. Patient not taking: Reported on 08/20/2015 07/30/15   Wyatt Portela, MD   BP 123/72 mmHg  Pulse 66  Temp(Src) 103.7 F (39.8 C) (Rectal)  Resp 20  SpO2 97% Physical Exam  Constitutional: He is oriented to person, place, and time. He appears well-developed and well-nourished.  HENT:  Head: Normocephalic and atraumatic.  Cardiovascular: Normal rate.   No murmur heard. Irregular rhythm  Pulmonary/Chest: Effort normal and breath sounds normal. No respiratory distress.  Abdominal: Soft. There is no tenderness. There is no rebound and no guarding.  Musculoskeletal: He exhibits no edema or tenderness.  Neurological: He is alert and oriented to person, place, and time.  Skin: Skin is warm and dry.  Petechiae on abdomen, bilateral lower extremities.  Psychiatric: He has a normal mood and affect. His behavior is normal.  Nursing note and vitals reviewed.   ED Course  Procedures   CRITICAL CARE Performed by: Quintella Reichert   Total critical care time: 30 minutes  Critical care time was exclusive of separately billable procedures and treating other patients.  Critical care was necessary to treat  or prevent imminent or life-threatening deterioration.  Critical care was time spent personally by me on the following activities: development of treatment plan with patient and/or surrogate as well as nursing, discussions with consultants, evaluation of patient's response to treatment, examination of patient, obtaining history from patient or surrogate, ordering and performing treatments and interventions, ordering and review of laboratory studies, ordering and review of radiographic studies, pulse oximetry and re-evaluation of patient's condition.   Labs Review Labs Reviewed  COMPREHENSIVE METABOLIC PANEL - Abnormal; Notable for the following:    Glucose, Bld 173 (*)    BUN 26 (*)    Albumin 3.2 (*)    Total Bilirubin 1.5 (*)    GFR calc non Af Amer 56 (*)    All other components within normal limits  URINALYSIS, ROUTINE W REFLEX MICROSCOPIC (NOT AT [facility]) - Abnormal; Notable for the following:    Color, Urine AMBER (*)    APPearance CLOUDY (*)    Hgb urine dipstick LARGE (*)    Protein, ur >300 (*)    Leukocytes, UA SMALL (*)    All other components within normal limits  URINE MICROSCOPIC-ADD ON - Abnormal; Notable for the following:    Squamous Epithelial / LPF 0-5 (*)    Bacteria, UA MANY (*)    All other components within normal limits  CBC WITH DIFFERENTIAL/PLATELET - Abnormal; Notable for the following:    WBC 0.7 (*)    RBC 2.97 (*)    Hemoglobin 10.1 (*)    HCT 29.0 (*)    Platelets 8 (*)    Neutro Abs 0.4 (*)    Lymphs Abs 0.3 (*)    Monocytes Absolute 0.0 (*)    All other components within normal limits  I-STAT CG4 LACTIC ACID, ED - Abnormal; Notable for the following:    Lactic Acid, Venous 2.93 (*)    All other components within normal limits  CULTURE, BLOOD (ROUTINE X 2)  CULTURE, BLOOD (ROUTINE X 2)  URINE CULTURE  PROTIME-INR  CBC WITH DIFFERENTIAL/PLATELET  CBC WITH DIFFERENTIAL/PLATELET  LACTIC ACID, PLASMA  LACTIC ACID, PLASMA  PROCALCITONIN  I-STAT  CG4 LACTIC ACID, ED  TYPE AND SCREEN  ABO/RH  PREPARE PLATELET PHERESIS    Imaging Review Dg Chest 2 View  08/27/2015  CLINICAL DATA:  Pt c/o fever onset today, subsided now, hematuria today, hx bladder cancer w/ PAC for treatment EXAM: CHEST  2 VIEW COMPARISON:  09/02/2013 FINDINGS: Mild enlargement of the cardiac silhouette. No mediastinal or hilar masses or evidence of adenopathy. Clear lungs.  No pleural effusion or pneumothorax. Right anterior chest wall Port-A-Cath tip projects at the caval atrial junction. Left anterior chest wall pacemaker is stable in well positioned. Bony thorax is demineralized but intact. IMPRESSION: No acute cardiopulmonary disease. Electronically Signed   By: Lajean Manes M.D.   On: 08/27/2015 19:27   I have personally reviewed and evaluated these images and lab results as part of my medical decision-making.   EKG Interpretation None      MDM   Final diagnoses:  Thrombocytopenia ([place])    Patient here for evaluation of fever, shaking chills, intermittent hematuria for the last 3 days. He has multiple petechiae on exam. Gross hematuria. Treating for potential UTI given his fever and hematuria. Given his thrombocytopenia, neutropenia, and active bleeding will transfusing unit of platelets. Provided IV fluid hydration and antibiotics. Plan to admit for further treatment. Discussed with Dr. Benay Spice with oncology as well as Dr. Arnoldo Morale with the hospitalist service.    Quintella Reichert, MD 08/27/15 [phone]

## 2015-08-27 NOTE — H&P (Signed)
[facility] Admission History and Physical       [name] [id number] DOB: Oct 22, 1929 DOA: 08/27/2015  Referring physician: EDP PCP: [name], MD  Specialists:   Chief Complaint: Fevers Chills Weakness and Bruising  HPI: [name] is a 82 y.o. male with a history of Bladder Cancer on Palliative Chemo Rx,  and last Chemo Rx was 1 week ago who presents to the ED with complaints of fever and chills weakness and multiple areas of bruising on all over hi body over the past 24 hours.  He reports being so weak today he could not get out of his chair.    He reports having hematuria chronically.    He was evaluated in the ED and was found to have a temperature to 103.2 and was pancytopenic, and his Platelets were 8.   A Sepsis Workup was started and Oncology was consulted, and advised transfusion of Platelets of which the patient is agreeable.   Initially, he had been given IV Rocephin, and the Antibiotics were expanded to IV Vancomycin and Zosyn, and he was placed in Patient Protective Precautions due to Neutropenia.       Review of Systems:   Constitutional: No Weight Loss, No Weight Gain, Night Sweats, +Fevers, +Chills, Dizziness, Light Headedness, Fatigue, +Generalized Weakness HEENT: No Headaches, Difficulty Swallowing,Tooth/Dental Problems,Sore Throat,  No Sneezing, Rhinitis, Ear Ache, Nasal Congestion, or Post Nasal Drip,  Cardio-vascular:  No Chest pain, Orthopnea, PND, Edema in Lower Extremities, Anasarca, Dizziness, Palpitations  Resp: No Dyspnea, No DOE, No Productive Cough, No Non-Productive Cough, No Hemoptysis, No Wheezing.    GI: No Heartburn, Indigestion, Abdominal Pain, Nausea, Vomiting, Diarrhea, Constipation, Hematemesis, Hematochezia, Melena, Change in Bowel Habits,  Loss of Appetite  GU: No Dysuria, +Hematuria, Urgency or Urinary Frequency, No Flank pain.  Musculoskeletal: No Joint Pain or Swelling, No Decreased Range of Motion, No Back Pain.    Neurologic: No Syncope, No Seizures, Muscle Weakness, Paresthesia, Vision Disturbance or Loss, No Diplopia, No Vertigo, No Difficulty Walking,  Skin: Numerous Bruises. Psych: No Change in Mood or Affect, No Depression or Anxiety, No Memory loss, No Confusion, or Hallucinations   Past Medical History  Diagnosis Date  . Atrial fibrillation ([place]) 1990  . Dyslipidemia   . Prostate hypertrophy   . Arthritis     degenerative-knees  . History of radiation therapy 05/05/07-06/15/07    right parotid through subclavicular region/ mid jugularlymph node chain  . Allergy   . Hypertension     labile  . Heart murmur     2/6 systolic  . History of chemotherapy      Hx carboplatin/53fu  . Xerostomia     limited  . Stroke [facility]) 2003    TIA  . HOH (hard of hearing)   . Pacemaker     2003 vent paced , rate 69  . Chronic kidney disease     positive cytology  . Dry eyes   . GERD (gastroesophageal reflux disease)   . History of radiation therapy 05/05/07-06/15/07    Right parotid/hemineck,supraclavicular  . Coronary artery disease   . Urinary tract infection     hx of   . Bladder cancer ([place]) 2010  . Cancer ([place]) 05/05/07-06/15/07    parotid gland/66.4 GY  . Cancer ([place])     left eyelid   . Tinnitus     right   . Right groin hernia   . Pneumonia     hx of   . Dysrhythmia  HX A FIB  . Skin cancer     HX BASAL CELL REMOVED  . Diabetes mellitus without complication ([place])     meds d/c  2-3 year ago     Past Surgical History  Procedure Laterality Date  . Pacemaker insertion  1990    last changed 2003  . Cystoscopy  01/2011    neg  . Back surgery    . Parotidectomy      right  . Craniotomy  12/14/2011    Procedure: CRANIOTOMY HEMATOMA EVACUATION SUBDURAL;  Surgeon: Ophelia Charter, MD;  Location: [place] [facility];  Service: Neurosurgery;  Laterality: Left;  LEFT Craniotomy for subdural   . Neurofibroma excision  1980  . Cystoscopy with biopsy  04/01/2012    Procedure:  CYSTOSCOPY WITH BIOPSY;  Surgeon: Claybon Jabs, MD;  Location: [facility];  Service: Urology;  Laterality: N/A;  BLADDER BIOPSIES and bladder washings  . Cystoscopy w/ retrogrades  04/01/2012    Procedure: CYSTOSCOPY WITH RETROGRADE PYELOGRAM;  Surgeon: Claybon Jabs, MD;  Location[facility];  Service: Urology;  Laterality: Bilateral;  . Ureteroscopy  04/01/2012    Procedure: URETEROSCOPY;  Surgeon: Claybon Jabs, MD;  [facility];  Service: Urology;  Laterality: Right;  . Cystoscopy/retrograde/ureteroscopy  05/02/2012    Procedure: CYSTOSCOPY/RETROGRADE/URETEROSCOPY;  Surgeon: Claybon Jabs, MD;  Location: [facility];  Service: Urology;  Laterality: Bilateral;  CYSTOSCOPY BILATERAL RETROGRADE PYLOGRAM BILATERAL URETEROSCOPY AND BIOPSY    . Cystoscopy with biopsy  05/02/2012    Procedure: CYSTOSCOPY WITH BIOPSY;  Surgeon: Claybon Jabs, MD;  Location: [facility];  Service: Urology;  Laterality: N/A;  . Cystoscopy with stent placement  05/02/2012    Procedure: CYSTOSCOPY WITH STENT PLACEMENT;  Surgeon: Claybon Jabs, MD;  Location: [facility];  Service: Urology;  Laterality: Bilateral;  . Pacemaker generator change  05/23/2002    [place], [place], serial [phone]  . Lower extremity arterial doppler  09/28/2011    No evidence of arterial insufficiency.   . Cardiovascular stress test  09/05/2009    Pharmalogical stress test w/o chest pain or EKG changes for ischemia  . Cardiac catheterization  09/20/2012    EF 50-55%, moderately calcified aortic valve leaflets, mild-moderate aortic valve stenosis, LA-appendage moderately dilated, moderate regurg of the mitral valve, RA moderately dilated  . Pacemaker generator change N/A 09/01/2013    Procedure: PACEMAKER GENERATOR CHANGE;  Surgeon: Sanda Klein, MD;  Location: [place] CATH [facility];  Service: Cardiovascular;  Laterality: N/A;    . Cystoscopy/retrograde/ureteroscopy Bilateral 01/21/2015    Procedure: CYSTOSCOPY/RETROGRADE/ BLADDER BIOPSY;  Surgeon: Kathie Rhodes, MD;  [facility];  Service: Urology;  Laterality: Bilateral;  . Cystoscopy with ureteroscopy and stent placement Right 02/15/2015    Procedure: CYSTOSCOPY WITH RIGHT URETEROSCOPY, RENAL PELVIC WASHINGS, STENT, RETROGRADE;  Surgeon: Kathie Rhodes, MD;  [facility];  Service: Urology;  Laterality: Right;      Prior to Admission medications   Medication Sig Start Date End Date Taking? Authorizing Provider  acetaminophen (TYLENOL) 325 MG tablet Take 1-2 tablets (325-650 mg total) by mouth every 4 (four) hours as needed for mild pain. 09/02/13  Yes Lendon Colonel, NP  ibuprofen (ADVIL,MOTRIN) 200 MG tablet Take 400 mg by mouth every 6 (six) hours as needed for fever.   Yes Historical Provider, MD  clopidogrel (PLAVIX) 75 MG tablet Take 1 tablet (75 mg total) by mouth every  morning. Reported on 08/20/2015 Patient not taking: Reported on 08/27/2015 08/20/15   Sanda Klein, MD  HYDROcodone-acetaminophen (NORCO) 10-325 MG per tablet Take 1-2 tablets by mouth every 4 (four) hours as needed for moderate pain. Maximum dose per 24 hours - 8 pills Patient not taking: Reported on 08/20/2015 02/15/15   Kathie Rhodes, MD  pantoprazole (PROTONIX) 40 MG tablet Take 1 tablet (40 mg total) by mouth daily. Patient not taking: Reported on 08/20/2015 07/17/14   Brett Canales, PA-C  phenazopyridine (PYRIDIUM) 200 MG tablet Take 1 tablet (200 mg total) by mouth 3 (three) times daily as needed for pain. Patient not taking: Reported on 08/20/2015 01/21/15   Kathie Rhodes, MD  prochlorperazine (COMPAZINE) 10 MG tablet Take 1 tablet (10 mg total) by mouth every 6 (six) hours as needed for nausea or vomiting. Patient not taking: Reported on 08/20/2015 07/30/15   Wyatt Portela, MD     Allergies  Allergen Reactions  . Codeine Other (See Comments)    Blisters between fingers  . Docetaxel  Rash    Social History:  reports that he has never smoked. He has never used smokeless tobacco. He reports that he drinks about 10.5 oz of alcohol per week. He reports that he does not use illicit drugs.    Family History  Problem Relation Age of Onset  . Heart disease Mother   . Cancer Father     Lung cancer  . Emphysema Brother   . Coronary artery disease Son        Physical Exam:  GEN:  Pleasant Elderly Obese 82 y.o. Caucasian male examined and in no acute distress; cooperative with exam Filed Vitals:   08/27/15 2030 08/27/15 2130 08/27/15 2200 08/27/15 2230  BP: 133/66 121/71 134/64 129/69  Pulse: 81 81 78 64  Temp:      TempSrc:      Resp:  18 19 18   SpO2: 94% 95% 95% 94%   Blood pressure 129/69, pulse 64, temperature 103.7 F (39.8 C), temperature source Rectal, resp. rate 18, SpO2 94 %. PSYCH: He is alert and oriented x4; does not appear anxious does not appear depressed; affect is normal HEENT: Normocephalic and Atraumatic, Mucous membranes pink; PERRLA; EOM intact; Fundi:  Benign;  No scleral icterus, Nares: Patent, Oropharynx: Clear, Fair Dentition,    Neck:  FROM, No Cervical Lymphadenopathy nor Thyromegaly or Carotid Bruit; No JVD; Breasts:: Not examined CHEST WALL: No tenderness CHEST: Normal respiration, clear to auscultation bilaterally HEART: Regular rate and rhythm; no murmurs rubs or gallops BACK: No kyphosis or scoliosis; No CVA tenderness ABDOMEN: Positive Bowel Sounds, Obese, Soft Non-Tender, No Rebound or Guarding; No Masses, No Organomegaly. Rectal Exam: Not done EXTREMITIES: No Cyanosis, Clubbing, or Edema; No Ulcerations. Genitalia: not examined PULSES: 2+ and symmetric SKIN: Diffuse Petechei, and +Ecchymosis  CNS:  Alert and Oriented x 4, No Focal Deficits Vascular: pulses palpable throughout    Labs on Admission:  Basic Metabolic Panel:  Recent Labs Lab 08/21/15 0951 08/27/15 1900  NA 135* 138  K 4.4 4.5  CL  --  102  CO2 27 23   GLUCOSE 126 173*  BUN 24.2 26*  CREATININE 1.2 1.17  CALCIUM 9.7 9.3   Liver Function Tests:  Recent Labs Lab 08/21/15 0951 08/27/15 1900  AST 29 28  ALT 20 17  ALKPHOS 133 116  BILITOT 1.83* 1.5*  PROT 8.0 6.9  ALBUMIN 3.3* 3.2*   No results for input(s): LIPASE, AMYLASE in the last 168  hours. No results for input(s): AMMONIA in the last 168 hours. CBC:  Recent Labs Lab 08/21/15 0951 08/27/15 2114  WBC 4.0 0.7*  NEUTROABS 2.6 0.4*  HGB 13.3 10.1*  HCT 38.0* 29.0*  MCV 98.2* 97.6  PLT 84* 8*   Cardiac Enzymes: No results for input(s): CKTOTAL, CKMB, CKMBINDEX, TROPONINI in the last 168 hours.  BNP (last 3 results) No results for input(s): BNP in the last 8760 hours.  ProBNP (last 3 results) No results for input(s): PROBNP in the last 8760 hours.  CBG: No results for input(s): GLUCAP in the last 168 hours.  Radiological Exams on Admission: Dg Chest 2 View  08/27/2015  CLINICAL DATA:  Pt c/o fever onset today, subsided now, hematuria today, hx bladder cancer w/ PAC for treatment EXAM: CHEST  2 VIEW COMPARISON:  09/02/2013 FINDINGS: Mild enlargement of the cardiac silhouette. No mediastinal or hilar masses or evidence of adenopathy. Clear lungs.  No pleural effusion or pneumothorax. Right anterior chest wall Port-A-Cath tip projects at the caval atrial junction. Left anterior chest wall pacemaker is stable in well positioned. Bony thorax is demineralized but intact. IMPRESSION: No acute cardiopulmonary disease. Electronically Signed   By: Lajean Manes M.D.   On: 08/27/2015 19:27     EKG: Independently reviewed.    Assessment/Plan:   82 y.o. 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[facility])     Oncology Dr Alen Blew to see in AM        Hematuria     Chronic due to Bladder Ca       Pancytopenia ([place])- due to Chemo Rx     Monitor Trend       Diabetes mellitus ([place])     SSi coverage PRN     Check HbA1C       Hypertension     Monitor BPs        Coronary artery disease     stable      DVT Prophylaxis     None due t Thrombocytopenia (PLTs = 8) and Extensive Ecchymosis          Code Status:     FULL CODE     Family Communication:  No Family Present    Disposition Plan:    Inpatient Status        Time spent:  58 Minutes      Theressa Millard [facility] Pager [phone]   If 7AM -7PM Please Contact the Day Rounding Team MD for [facility]  If 7PM-7AM, Please Contact Night-Floor Coverage  [email or web address] Password [email or web address] 08/27/2015, 10:49 PM     ADDENDUM:   Patient was seen and examined on 08/27/2015

## 2015-08-27 NOTE — ED Notes (Signed)
Bed: [facility] Expected date:  Expected time:  Means of arrival:  Comments: EMS- elderly hematuria

## 2015-08-28 DIAGNOSIS — C651 Malignant neoplasm of right renal pelvis: Secondary | ICD-10-CM

## 2015-08-28 DIAGNOSIS — D696 Thrombocytopenia, unspecified: Secondary | ICD-10-CM

## 2015-08-28 DIAGNOSIS — D709 Neutropenia, unspecified: Secondary | ICD-10-CM

## 2015-08-28 DIAGNOSIS — I2510 Atherosclerotic heart disease of native coronary artery without angina pectoris: Secondary | ICD-10-CM

## 2015-08-28 DIAGNOSIS — E119 Type 2 diabetes mellitus without complications: Secondary | ICD-10-CM

## 2015-08-28 DIAGNOSIS — A419 Sepsis, unspecified organism: Principal | ICD-10-CM

## 2015-08-28 DIAGNOSIS — E440 Moderate protein-calorie malnutrition: Secondary | ICD-10-CM

## 2015-08-28 DIAGNOSIS — D6959 Other secondary thrombocytopenia: Secondary | ICD-10-CM

## 2015-08-28 DIAGNOSIS — D61818 Other pancytopenia: Secondary | ICD-10-CM

## 2015-08-28 LAB — LACTIC ACID, PLASMA
LACTIC ACID, VENOUS: 1.7 mmol/L (ref 0.5–2.0)
Lactic Acid, Venous: 1.3 mmol/L (ref 0.5–2.0)

## 2015-08-28 LAB — CBC
HCT: 27.9 % — ABNORMAL LOW (ref 39.0–52.0)
Hemoglobin: 9.6 g/dL — ABNORMAL LOW (ref 13.0–17.0)
MCH: 33.8 pg (ref 26.0–34.0)
MCHC: 34.4 g/dL (ref 30.0–36.0)
MCV: 98.2 fL (ref 78.0–100.0)
PLATELETS: 38 10*3/uL — AB (ref 150–400)
RBC: 2.84 MIL/uL — ABNORMAL LOW (ref 4.22–5.81)
RDW: 12.6 % (ref 11.5–15.5)
WBC: 1.1 10*3/uL — AB (ref 4.0–10.5)

## 2015-08-28 LAB — PROTIME-INR
INR: 1.04 (ref 0.00–1.49)
Prothrombin Time: 13.8 seconds (ref 11.6–15.2)

## 2015-08-28 LAB — BASIC METABOLIC PANEL
ANION GAP: 10 (ref 5–15)
BUN: 25 mg/dL — AB (ref 6–20)
CHLORIDE: 102 mmol/L (ref 101–111)
CO2: 23 mmol/L (ref 22–32)
CREATININE: 0.97 mg/dL (ref 0.61–1.24)
Calcium: 8.4 mg/dL — ABNORMAL LOW (ref 8.9–10.3)
GFR calc non Af Amer: >60 mL/min (ref >60)
GLUCOSE: 146 mg/dL — AB (ref 65–99)
Potassium: 4.2 mmol/L (ref 3.5–5.1)
SODIUM: 135 mmol/L (ref 135–145)

## 2015-08-28 LAB — ABO/RH: ABO/RH(D): AB POS

## 2015-08-28 LAB — PROCALCITONIN: Procalcitonin: 0.39 ng/mL

## 2015-08-28 LAB — APTT: APTT: 22 s — AB (ref 24–37)

## 2015-08-28 MED ORDER — OXYCODONE HCL 5 MG PO TABS
5.0000 mg | ORAL_TABLET | ORAL | Status: DC | PRN
  Administered 2015-08-28 – 2015-09-03 (×6): 5 mg via ORAL
  Filled 2015-08-28 (×6): qty 1

## 2015-08-28 MED ORDER — GLUCERNA SHAKE PO LIQD
237.0000 mL | Freq: Three times a day (TID) | ORAL | Status: DC
Start: 2015-08-28 — End: 2015-09-03
  Administered 2015-08-29 – 2015-09-02 (×3): 237 mL via ORAL
  Filled 2015-08-28 (×18): qty 237

## 2015-08-28 MED ORDER — GLUCERNA SHAKE PO LIQD
237.0000 mL | Freq: Two times a day (BID) | ORAL | Status: DC
  Administered 2015-08-28: 237 mL via ORAL
  Filled 2015-08-28 (×2): qty 237

## 2015-08-28 MED ORDER — VANCOMYCIN HCL IN DEXTROSE 750-5 MG/150ML-% IV SOLN
750.0000 mg | Freq: Two times a day (BID) | INTRAVENOUS | Status: DC
  Administered 2015-08-28 – 2015-08-29 (×4): 750 mg via INTRAVENOUS
  Filled 2015-08-28 (×5): qty 150

## 2015-08-28 MED ORDER — ENOXAPARIN SODIUM 80 MG/0.8ML ~~LOC~~ SOLN: 1.0000 mg/kg | Freq: Two times a day (BID) | SUBCUTANEOUS | Status: DC

## 2015-08-28 MED ORDER — ACETAMINOPHEN 650 MG RE SUPP: 650.0000 mg | Freq: Four times a day (QID) | RECTAL | Status: DC | PRN

## 2015-08-28 MED ORDER — HYDROMORPHONE HCL 1 MG/ML IJ SOLN
0.5000 mg | INTRAMUSCULAR | Status: DC | PRN
  Administered 2015-08-28 (×3): 1 mg via INTRAVENOUS
  Filled 2015-08-28 (×3): qty 1

## 2015-08-28 MED ORDER — ALUM & MAG HYDROXIDE-SIMETH 200-200-20 MG/5ML PO SUSP
30.0000 mL | Freq: Four times a day (QID) | ORAL | Status: DC | PRN
  Administered 2015-08-28 – 2015-08-29 (×2): 30 mL via ORAL
  Filled 2015-08-28 (×2): qty 30

## 2015-08-28 MED ORDER — SODIUM CHLORIDE 0.9% FLUSH
10.0000 mL | INTRAVENOUS | Status: DC | PRN
  Administered 2015-08-29: 10 mL
  Filled 2015-08-28: qty 40

## 2015-08-28 MED ORDER — SODIUM CHLORIDE 0.9% FLUSH
10.0000 mL | Freq: Two times a day (BID) | INTRAVENOUS | Status: DC
  Administered 2015-08-28 – 2015-08-30 (×3): 10 mL
  Administered 2015-08-31: 40 mL
  Administered 2015-09-01: 30 mL

## 2015-08-28 MED ORDER — SODIUM CHLORIDE 0.9 % IV SOLN
INTRAVENOUS | Status: DC
  Administered 2015-08-28: 01:00:00 via INTRAVENOUS
  Administered 2015-08-28: 1000 mL via INTRAVENOUS
  Administered 2015-08-29 – 2015-09-03 (×4): via INTRAVENOUS

## 2015-08-28 MED ORDER — ONDANSETRON HCL 4 MG PO TABS: 4.0000 mg | ORAL_TABLET | Freq: Four times a day (QID) | ORAL | Status: DC | PRN

## 2015-08-28 MED ORDER — ACETAMINOPHEN 325 MG PO TABS
650.0000 mg | ORAL_TABLET | Freq: Four times a day (QID) | ORAL | Status: DC | PRN
Start: 2015-08-28 — End: 2015-09-03

## 2015-08-28 MED ORDER — PIPERACILLIN-TAZOBACTAM 3.375 G IVPB
3.3750 g | Freq: Three times a day (TID) | INTRAVENOUS | Status: DC
  Administered 2015-08-28 – 2015-08-31 (×10): 3.375 g via INTRAVENOUS
  Filled 2015-08-28 (×9): qty 50

## 2015-08-28 MED ORDER — ONDANSETRON HCL 4 MG/2ML IJ SOLN: 4.0000 mg | Freq: Four times a day (QID) | INTRAMUSCULAR | Status: DC | PRN

## 2015-08-28 NOTE — Progress Notes (Addendum)
[facility] PROGRESS NOTE  [name] [id number] DOB: [birth year] DOA: 08/27/2015 PCP: [name], MD  Assessment/Plan: 1-sepsis due to UTI: Present on admission (total disease of central 0.8, temperature 101.3; source of infection his urine). With hematuria -Continue current antibiotics -Follow culture data -Continue as needed antipyretics -Follow clinical response -Up appointment heparin products and NSAIDs  2-pancytopenia: Secondary to ongoing chemotherapy -Patient has received platelet transfusion; currently 38,000 platelets on repeat blood work -Wbc's trending up and per oncology service no need for Neulasta -Hemoglobin stable and no need for transfusion currently -Will follow blood count trend  3-neutropenic fever: -Continue current IV antibiotics -follow culture data  4-diabetes mellitus: Currently following diet control -will use SSI while inpatient -most recent A1C 5.6  5-CAD: no chest pain -Will hold Plavix while actively having hematuria   6-protein calorie-malnutrition: Moderate -Nutrition service on board -will follow rec's for feeding supplements -Patient encouraged to follow good nutrition and hydration   Code Status: Full code Family Communication: Daughter at bedside Disposition Plan: Continue current antibiotic therapy, follow supportive care, remains inpatient. Follow culture data   Consultants:  Oncology service  Procedures:  See below for x-ray reports  Antibiotics:  Vancomycin and Zosyn  08/27/15  HPI/Subjective:  afebrile, denies chest pain, no shortness of breath. Patient reports feeling better and has noticed improvement in the color of his urine (less hematuria).  Objective: Filed Vitals:   08/28/15 0319 08/28/15 1441  BP: 132/68 102/62  Pulse: 61 64  Temp: 98.1 F (36.7 C) 98.3 F (36.8 C)  Resp: 14 14    Intake/Output Summary (Last 24 hours) at 08/28/15 1617 Last data filed at 08/28/15 1418  Gross per  24 hour  Intake 1387.5 ml  Output    880 ml  Net  507.5 ml   Filed Weights   08/28/15 0319  Weight: 82.192 kg (181 lb 3.2 oz)    Exam:   General:  Currently afebrile, feeling better and denying any nausea, vomiting, chest pain, shortness of breath or abdominal discomfort.  Cardiovascular: S1 and S2, positive systolic murmur, no rubs, no gallops  Respiratory: Clear to auscultation bilaterally  Abdomen: soft, NT, ND, positive BS  Musculoskeletal: multiple bruises on his extremities, 1+ edema bilaterally   Data Reviewed: Basic Metabolic Panel:  Recent Labs Lab 08/27/15 1900 08/28/15 0358  NA 138 135  K 4.5 4.2  CL 102 102  CO2 23 23  GLUCOSE 173* 146*  BUN 26* 25*  CREATININE 1.17 0.97  CALCIUM 9.3 8.4*   Liver Function Tests:  Recent Labs Lab 08/27/15 1900  AST 28  ALT 17  ALKPHOS 116  BILITOT 1.5*  PROT 6.9  ALBUMIN 3.2*   CBC:  Recent Labs Lab 08/27/15 2114 08/28/15 0358  WBC 0.7* 1.1*  NEUTROABS 0.4*  --   HGB 10.1* 9.6*  HCT 29.0* 27.9*  MCV 97.6 98.2  PLT 8* 38*    Recent Results (from the past 240 hour(s))  Urine culture     Status: None (Preliminary result)   Collection Time: 08/27/15  6:41 PM  Result Value Ref Range Status   Specimen Description URINE, CLEAN CATCH  Final   Special Requests NONE  Final   Culture   Final    CULTURE REINCUBATED FOR BETTER GROWTH Performed at [facility]    Report Status PENDING  Incomplete  Culture, blood (routine x 2)     Status: None (Preliminary result)   Collection Time: 08/27/15  6:57 PM  Result Value  Ref Range Status   Specimen Description BLOOD PORTA CATH  Final   Special Requests BOTTLES DRAWN AEROBIC AND ANAEROBIC 5CC  Final   Culture   Final    NO GROWTH < 24 HOURS Performed at [facility]    Report Status PENDING  Incomplete  Culture, blood (routine x 2)     Status: None (Preliminary result)   Collection Time: 08/27/15  8:10 PM  Result Value Ref Range Status    Specimen Description BLOOD RIGHT ARM  Final   Special Requests BOTTLES DRAWN AEROBIC AND ANAEROBIC 5CC  Final   Culture   Final    NO GROWTH < 24 HOURS Performed at [facility]    Report Status PENDING  Incomplete     Studies: Dg Chest 2 View  08/27/2015  CLINICAL DATA:  Pt c/o fever onset today, subsided now, hematuria today, hx bladder cancer w/ PAC for treatment EXAM: CHEST  2 VIEW COMPARISON:  09/02/2013 FINDINGS: Mild enlargement of the cardiac silhouette. No mediastinal or hilar masses or evidence of adenopathy. Clear lungs.  No pleural effusion or pneumothorax. Right anterior chest wall Port-A-Cath tip projects at the caval atrial junction. Left anterior chest wall pacemaker is stable in well positioned. Bony thorax is demineralized but intact. IMPRESSION: No acute cardiopulmonary disease. Electronically Signed   By: Lajean Manes M.D.   On: 08/27/2015 19:27    Scheduled Meds: . feeding supplement (GLUCERNA SHAKE)  237 mL Oral TID BM  . piperacillin-tazobactam (ZOSYN)  IV  3.375 g Intravenous 3 times per day  . vancomycin  750 mg Intravenous Q12H   Continuous Infusions: . sodium chloride 75 mL/hr at 08/28/15 0100    Principal Problem:   Thrombocytopenia ([place]) Active Problems:   Diabetes mellitus ([place])   Hypertension   Cancer of renal pelvis ([name]   Coronary artery disease   Hematuria   Pancytopenia ([place])   Malnutrition of moderate degree    Time spent: 35 minutes    Barton Dubois  [facility] Pager [phone]. If 7PM-7AM, please contact night-coverage at [email or web address], password [facility] 08/28/2015, 4:17 PM  LOS: 1 day

## 2015-08-28 NOTE — Progress Notes (Signed)
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 He has advanced disease with pelvic adenopathy. He received the first cycle of chemotherapy utilizing gemcitabine carboplatin with the last dose of gemcitabine given on 08/21/2015.  He presented with pancytopenia and specially profound thrombocytopenia. He has also reported bruising, petechiae, weakness and early signs of sepsis.  He was hospitalized on 08/27/2015 and received platelet transfusion as well as broad-spectrum antibiotics.  Clinically, he is feeling better today without any major complaints. He denies any fevers or chills. Denied any pain or discomfort. He is asking for food this morning. He still have hematuria notice by dark urine in the Foley catheter.  Objective:  Vital signs in last 24 hours: Temp:  [98.1 F (36.7 C)-103.7 F (39.8 C)] 98.1 F (36.7 C) (03/08 0319) Pulse Rate:  [59-81] 61 (03/08 0319) Resp:  [14-20] 14 (03/08 0319) BP: (109-151)/(58-80) 132/68 mmHg (03/08 0319) SpO2:  [94 %-100 %] 100 % (03/08 0319) Weight:  [181 lb 3.2 oz (82.192 kg)] 181 lb 3.2 oz (82.192 kg) (03/08 0319) Weight change:     Intake/Output from previous day:   Alert, awake gentleman appeared in no distress. Hard of hearing. Mouth: mucous membranes moist, pharynx normal without lesions Resp: clear to auscultation bilaterally Cardio: regular rate and rhythm, S1, S2 normal, no murmur, click, rub or gallop GI: soft, non-tender; bowel sounds normal; no masses,  no organomegaly Extremities: extremities normal, atraumatic, no cyanosis or edema  Skin: Ecchymosis noted on his upper extremities:  Portacath site without any erythema, induration or bruising.  Lab Results:  Recent Labs  08/27/15 2114 08/28/15 0358  WBC 0.7* 1.1*  HGB 10.1* 9.6*  HCT 29.0* 27.9*  PLT 8* 38*    BMET  Recent Labs  08/27/15 1900 08/28/15 0358  NA 138 135  K 4.5 4.2   CL 102 102  CO2 23 23  GLUCOSE 173* 146*  BUN 26* 25*  CREATININE 1.17 0.97  CALCIUM 9.3 8.4*    Studies/Results: Dg Chest 2 View  08/27/2015  CLINICAL DATA:  Pt c/o fever onset today, subsided now, hematuria today, hx bladder cancer w/ PAC for treatment EXAM: CHEST  2 VIEW COMPARISON:  09/02/2013 FINDINGS: Mild enlargement of the cardiac silhouette. No mediastinal or hilar masses or evidence of adenopathy. Clear lungs.  No pleural effusion or pneumothorax. Right anterior chest wall Port-A-Cath tip projects at the caval atrial junction. Left anterior chest wall pacemaker is stable in well positioned. Bony thorax is demineralized but intact. IMPRESSION: No acute cardiopulmonary disease. Electronically Signed   By: Lajean Manes M.D.   On: 08/27/2015 19:27    Medications: I have reviewed the patient's current medications.  Assessment/Plan:  82 year old gentleman with the following issues:  1. Neutropenia with sepsis: I agree with the current management with broad-spectrum antibiotics including vancomycin and Zosyn. He does not have any localizing symptoms at this time and blood cultures are pending. His white cell count is improving up to 1.1 today and likely will continue to improve. See no need for any growth factor support with Neulasta at this time given his clinical stability.  I recommend keeping him on intravenous antibiotics to his [place] is above 500 and cultures are negative.  2. Thrombocytopenia: Related to chemotherapy and appears to respond to transfusion. Platelet count today is 38 from 8 after the transfusion. I do not recommend any platelet transfusion unless active bleeding is noted. If his hematuria becomes  brisk and continuous then I recommend keeping his platelet was close to 50.  3. 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 If he is fit to receive that, he will  receive it at a reduced doses unlikely related eliminated a day 8 of the cycle.  4. Disposition: He does not appear to be ready for discharge from my standpoint given his cytopenias, sepsis and possible bleeding. Once these issues resolve, he has a follow-up set up at the [facility] upon discharge.  I appreciate the care of the hospitalist team I will continue to follow on his progress.   LOS: 1 day   [facility] 08/28/2015, 7:46 AM

## 2015-08-28 NOTE — Progress Notes (Signed)
Pharmacy Antibiotic Note  [name] is a 82 y.o. male admitted on 08/27/2015 with sepsis.  Pharmacy has been consulted for Vancomycin and Zosyn  dosing.  Plan: Vancomycin 750mg   IV every 12 hours.  Goal trough 15-20 mcg/mL. Zosyn 3.375g IV q8h (4 hour infusion).     Temp (24hrs), Avg:103.5 F (39.7 C), Min:103.2 F (39.6 C), Max:103.7 F (39.8 C)   Recent Labs Lab 08/21/15 0951 08/27/15 1900 08/27/15 1924 08/27/15 2114 08/27/15 2224 08/27/15 2323  WBC 4.0  --   --  0.7*  --   --   CREATININE 1.2 1.17  --   --   --   --   LATICACIDVEN  --   --  2.93*  --  0.99 1.3    Estimated Creatinine Clearance: 47.8 mL/min (by C-G formula based on Cr of 1.17).    Allergies  Allergen Reactions  . Codeine Other (See Comments)    Blisters between fingers  . Docetaxel Rash    Antimicrobials this admission: Vancomycin 3/7 Zosyn 3/7 Ceftriaxone 3/7  Thank you for allowing pharmacy to be a part of this patient's care.  Nani Skillern Crowford 08/28/2015 12:52 AM

## 2015-08-28 NOTE — ED Notes (Signed)
Writer was notified by main lab that PT-INR tubes have been clotted, writer has drawn 3 different tubes of PT-INR. Main lab stated that they will send their own phlebotomy to draw blood.

## 2015-08-28 NOTE — Progress Notes (Signed)
Initial Nutrition Assessment  DOCUMENTATION CODES:   Non-severe (moderate) malnutrition in context of chronic illness  INTERVENTION:   - Provide Glucerna Shake po TID, each supplement provides 220 kcals and 10 grams of protein. - Continue encouraging good po intake.  NUTRITION DIAGNOSIS:   Inadequate oral intake related to poor appetite as evidenced by per patient/family report.  GOAL:   Patient will meet greater than or equal to 90% of their needs  MONITOR:   PO intake, Supplement acceptance, Labs, Weight trends  REASON FOR ASSESSMENT:   Malnutrition Screening Tool    ASSESSMENT:   82 y.o. male with a history of Bladder Cancer on Palliative Chemo Rx, and last Chemo Rx was 1 week ago who presents to the ED with complaints of fever and chills weakness and multiple areas of bruising on all over his body over the past 24 hours. He reports being so weak today he could not get out of his chair.   Patient and daughter reports decreased appetite for the past 6 months.  States that pt consumes 2 meals per day PTA. This morning pt consumed [place] sausage and about half of his eggs and toast, 50% meal completion per chart review.  Pt reports that he has a moderate appetite today.    Nutrition Focused Physical Exam was completed.  Findings include mild to moderate fat depletion in upper arm region, mild muscle depletion in calf region, and mild edema. Pt reports that he has lost 20 lbs x 6 months which cannot be verified in the chart.  Per chart review, pt lost 8 lbs since 07/29/25, representing a 4% weight loss x 1 month, insignificant for this time period.   Medications and labs reviewed: glucose elevated (146-173). Patient amenable to trying Glucerna supplement.  Will order TID.   Diet Order:  Diet Heart Room service appropriate?: Yes; Fluid consistency:: Thin  Skin:  Reviewed, no issues  Last BM:  Unknown  Height:   Ht Readings from Last 1 Encounters:  08/28/15 5\' 9"  (1.753 m)     Weight:   Wt Readings from Last 1 Encounters:  08/28/15 181 lb 3.2 oz (82.192 kg)    Ideal Body Weight:  72.7 kg  BMI:  Body mass index is 26.75 kg/(m^2).  Estimated Nutritional Needs:   Kcal:  2200-2400  Protein:  120-130 grams  Fluid:  >/= 2 L  EDUCATION NEEDS:   No education needs identified at this time  Veronda Prude, Dietetic Intern Pager: [phone]

## 2015-08-29 ENCOUNTER — Telehealth: Payer: Self-pay | Admitting: *Deleted

## 2015-08-29 DIAGNOSIS — N4889 Other specified disorders of penis: Secondary | ICD-10-CM

## 2015-08-29 DIAGNOSIS — R319 Hematuria, unspecified: Secondary | ICD-10-CM

## 2015-08-29 DIAGNOSIS — I10 Essential (primary) hypertension: Secondary | ICD-10-CM

## 2015-08-29 LAB — CBC WITH DIFFERENTIAL/PLATELET
BAND NEUTROPHILS: 4 %
BASOS ABS: 0 10*3/uL (ref 0.0–0.1)
BASOS PCT: 0 %
EOS PCT: 0 %
Eosinophils Absolute: 0 10*3/uL (ref 0.0–0.7)
HEMATOCRIT: 27 % — AB (ref 39.0–52.0)
HEMOGLOBIN: 9.3 g/dL — AB (ref 13.0–17.0)
LYMPHS PCT: 44 %
Lymphs Abs: 0.8 10*3/uL (ref 0.7–4.0)
MCH: 34.1 pg — AB (ref 26.0–34.0)
MCHC: 34.4 g/dL (ref 30.0–36.0)
MCV: 98.9 fL (ref 78.0–100.0)
MONOS PCT: 0 %
Monocytes Absolute: 0 10*3/uL — ABNORMAL LOW (ref 0.1–1.0)
Neutro Abs: 1 10*3/uL — ABNORMAL LOW (ref 1.7–7.7)
Neutrophils Relative %: 52 %
PLATELETS: 26 10*3/uL — AB (ref 150–400)
RBC: 2.73 MIL/uL — ABNORMAL LOW (ref 4.22–5.81)
RDW: 12.9 % (ref 11.5–15.5)
WBC: 1.8 10*3/uL — ABNORMAL LOW (ref 4.0–10.5)

## 2015-08-29 LAB — HEMOGLOBIN A1C
HEMOGLOBIN A1C: 6.3 % — AB (ref 4.8–5.6)
Mean Plasma Glucose: 134 mg/dL

## 2015-08-29 LAB — PREPARE PLATELET PHERESIS: UNIT DIVISION: 0

## 2015-08-29 MED ORDER — OXYBUTYNIN CHLORIDE 5 MG PO TABS
5.0000 mg | ORAL_TABLET | Freq: Three times a day (TID) | ORAL | Status: DC | PRN
  Administered 2015-08-29 – 2015-09-02 (×6): 5 mg via ORAL
  Filled 2015-08-29 (×6): qty 1

## 2015-08-29 MED ORDER — SODIUM CHLORIDE 0.9 % IV SOLN
Freq: Once | INTRAVENOUS | Status: AC
  Administered 2015-08-29: 16:00:00 via INTRAVENOUS

## 2015-08-29 NOTE — Progress Notes (Signed)
[facility] PROGRESS NOTE  KAYDIN WAGNER [id number] DOB: 1930-01-26 DOA: 08/27/2015 PCP: [name], MD  Assessment/Plan: 1-sepsis due to UTI: Present on admission (WBC's 0.8, temperature 101.3; source of infection his urine). With hematuria -Continue current antibiotics -Follow culture data (negative up today) -Continue as needed antipyretics -Follow clinical response -Up appointment heparin products and NSAIDs  2-pancytopenia: Secondary to ongoing chemotherapy -Patient has received platelet transfusion; but given ongoing hematuria and dropped into 26,000 range will transfuse as instructed by oncology service and will try to keep it > 50K. (2 more units of platelets has been ordered) -Wbc's trending up and per oncology service no need for Neulasta currently  -Hemoglobin stable and no need for transfusion currently -Will follow blood count trend  3-neutropenic fever: -Continue current IV antibiotics -follow culture data; negative up to date -[place] 1000 -patient has remained afebrile   4-diabetes mellitus: Currently following diet control -will use SSI while inpatient -most recent A1C 5.6  5-CAD: no chest pain -Will hold Plavix while actively having hematuria   6-protein calorie-malnutrition: Moderate -Nutrition service on board -will follow rec's for feeding supplements -Patient encouraged to follow good nutrition and hydration   Code Status: Full code Family Communication: Daughter at bedside Disposition Plan: Continue current antibiotic therapy, follow supportive care, remains inpatient. Follow culture data   Consultants:  Oncology service  Procedures:  See below for x-ray reports  Antibiotics:  Vancomycin and Zosyn  08/27/15  HPI/Subjective:  afebrile, denies chest pain, no shortness of breath. Patient complaining of bladder spasm and is still with signs of hematuria on his Foley catheter  Objective: Filed Vitals:   08/29/15 1217 08/29/15  1412  BP: 139/64 136/68  Pulse: 63 64  Temp: 98.8 F (37.1 C) 97.9 F (36.6 C)  Resp: 18 16    Intake/Output Summary (Last 24 hours) at 08/29/15 1455 Last data filed at 08/29/15 1412  Gross per 24 hour  Intake   1305 ml  Output    840 ml  Net    465 ml   Filed Weights   08/28/15 0319  Weight: 82.192 kg (181 lb 3.2 oz)    Exam:   General:  Currently afebrile, feeling much better and denying any nausea, vomiting, chest pain, shortness of breath or abdominal discomfort. Still with ongoing hematuria appreciated on Foley catheter.  Cardiovascular: S1 and S2, positive systolic murmur, no rubs, no gallops  Respiratory: Clear to auscultation bilaterally  Abdomen: soft, NT, ND, positive BS  Musculoskeletal: multiple bruises on his extremities, trace to 1+ edema bilaterally   Data Reviewed: Basic Metabolic Panel:  Recent Labs Lab 08/27/15 1900 08/28/15 0358  NA 138 135  K 4.5 4.2  CL 102 102  CO2 23 23  GLUCOSE 173* 146*  BUN 26* 25*  CREATININE 1.17 0.97  CALCIUM 9.3 8.4*   Liver Function Tests:  Recent Labs Lab 08/27/15 1900  AST 28  ALT 17  ALKPHOS 116  BILITOT 1.5*  PROT 6.9  ALBUMIN 3.2*   CBC:  Recent Labs Lab 08/27/15 2114 08/28/15 0358 08/29/15 0812  WBC 0.7* 1.1* 1.8*  NEUTROABS 0.4*  --  1.0*  HGB 10.1* 9.6* 9.3*  HCT 29.0* 27.9* 27.0*  MCV 97.6 98.2 98.9  PLT 8* 38* 26*    Recent Results (from the past 240 hour(s))  Urine culture     Status: None (Preliminary result)   Collection Time: 08/27/15  6:41 PM  Result Value Ref Range Status   Specimen Description URINE, CLEAN CATCH  Final   Special Requests NONE  Final   Culture   Final    >=100,000 COLONIES/mL GRAM NEGATIVE RODS Performed at [facility]    Report Status PENDING  Incomplete  Culture, blood (routine x 2)     Status: None (Preliminary result)   Collection Time: 08/27/15  6:57 PM  Result Value Ref Range Status   Specimen Description BLOOD PORTA CATH  Final    Special Requests BOTTLES DRAWN AEROBIC AND ANAEROBIC 5CC  Final   Culture   Final    NO GROWTH 2 DAYS Performed at [facility]    Report Status PENDING  Incomplete  Culture, blood (routine x 2)     Status: None (Preliminary result)   Collection Time: 08/27/15  8:10 PM  Result Value Ref Range Status   Specimen Description BLOOD RIGHT ARM  Final   Special Requests BOTTLES DRAWN AEROBIC AND ANAEROBIC 5CC  Final   Culture   Final    NO GROWTH 2 DAYS Performed at [facility]    Report Status PENDING  Incomplete     Studies: Dg Chest 2 View  08/27/2015  CLINICAL DATA:  Pt c/o fever onset today, subsided now, hematuria today, hx bladder cancer w/ PAC for treatment EXAM: CHEST  2 VIEW COMPARISON:  09/02/2013 FINDINGS: Mild enlargement of the cardiac silhouette. No mediastinal or hilar masses or evidence of adenopathy. Clear lungs.  No pleural effusion or pneumothorax. Right anterior chest wall Port-A-Cath tip projects at the caval atrial junction. Left anterior chest wall pacemaker is stable in well positioned. Bony thorax is demineralized but intact. IMPRESSION: No acute cardiopulmonary disease. Electronically Signed   By: Lajean Manes M.D.   On: 08/27/2015 19:27    Scheduled Meds: . sodium chloride   Intravenous Once  . feeding supplement (GLUCERNA SHAKE)  237 mL Oral TID BM  . piperacillin-tazobactam (ZOSYN)  IV  3.375 g Intravenous 3 times per day  . sodium chloride flush  10-40 mL Intracatheter Q12H  . vancomycin  750 mg Intravenous Q12H   Continuous Infusions: . sodium chloride 75 mL/hr at 08/29/15 0048    Principal Problem:   Thrombocytopenia ([place]) Active Problems:   Diabetes mellitus ([place])   Hypertension   Cancer of renal pelvis ([place])   Coronary artery disease   Hematuria   Pancytopenia ([place])   Malnutrition of moderate degree   Diet-controlled diabetes mellitus ([place])    Time spent: 35 minutes    Barton Dubois  [facility] Pager [phone].  If 7PM-7AM, please contact night-coverage at [email or web address], password [facility] 08/29/2015, 2:55 PM  LOS: 2 days

## 2015-08-29 NOTE — Progress Notes (Addendum)
IP PROGRESS NOTE  Subjective:   [name] feels reasonably fair today. Reports no new complaints but continues to have pain at the tip of the penis which is chronic in nature. It is not related to his Foley catheter. No active bleeding noted but his urine continues to be rather dark with evidence of possible bleeding.  No fevers, chills or sweats. No shortness of breath or difficulty breathing.  Objective:  Vital signs in last 24 hours: Temp:  [97.7 F (36.5 C)-98.6 F (37 C)] 97.7 F (36.5 C) (03/09 0600) Pulse Rate:  [63-69] 63 (03/09 0600) Resp:  [14-16] 16 (03/09 0600) BP: (102-139)/(62-70) 139/62 mmHg (03/09 0600) SpO2:  [97 %-99 %] 99 % (03/09 0600) Weight change:  Last BM Date: 08/26/15  Intake/Output from previous day: 03/08 0701 - 03/09 0700 In: 2127.5 [P.O.:980; I.V.:997.5; IV Piggyback:150] Out: 1720 [Urine:1720] Alert, awake gentleman appeared in no distress.  Mouth: mucous membranes moist, pharynx normal without lesions Resp: clear to auscultation bilaterally Cardio: regular rate and rhythm, S1, S2 normal, no murmur, click, rub or gallop GI: soft, non-tender; bowel sounds normal; no masses,  no organomegaly Extremities: extremities normal, atraumatic, no cyanosis or edema  Skin: Ecchymosis noted on his upper extremities:  Portacath site without any erythema, induration or bruising.  Lab Results:  Recent Labs  08/27/15 2114 08/28/15 0358  WBC 0.7* 1.1*  HGB 10.1* 9.6*  HCT 29.0* 27.9*  PLT 8* 38*    BMET  Recent Labs  08/27/15 1900 08/28/15 0358  NA 138 135  K 4.5 4.2  CL 102 102  CO2 23 23  GLUCOSE 173* 146*  BUN 26* 25*  CREATININE 1.17 0.97  CALCIUM 9.3 8.4*    Studies/Results: Dg Chest 2 View  08/27/2015  CLINICAL DATA:  Pt c/o fever onset today, subsided now, hematuria today, hx bladder cancer w/ PAC for treatment EXAM: CHEST  2 VIEW COMPARISON:  09/02/2013 FINDINGS: Mild enlargement of the cardiac silhouette. No mediastinal or hilar  masses or evidence of adenopathy. Clear lungs.  No pleural effusion or pneumothorax. Right anterior chest wall Port-A-Cath tip projects at the caval atrial junction. Left anterior chest wall pacemaker is stable in well positioned. Bony thorax is demineralized but intact. IMPRESSION: No acute cardiopulmonary disease. Electronically Signed   By: Lajean Manes M.D.   On: 08/27/2015 19:27    Medications: I have reviewed the patient's current medications.  Assessment/Plan:  82 year old gentleman with the following issues:  1. Neutropenia with sepsis: Blood and urine cultures continues to be negative at this time. I recommend keeping him on intravenous antibiotics to his [place] is above 500 and cultures are negative.  2. Thrombocytopenia: Related to chemotherapy and appears to respond to transfusion. Platelet count today is pending. I recommend repeat to transfusion if his platelets are below 50 given his active GU bleeding.  3. 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 If he is fit to receive that, he will receive it at a reduced doses unlikely related eliminated a day 8 of the cycle.  4. The pain at the tip of the penis and hematuria: Patient is known to Urology. I recommend a urology consult of his issues persist. I do think his hematuria is likely related to thrombocytopenia and coagulopathy.  5. Disposition: He does not appear to be ready for discharge from my standpoint given his cytopenias, sepsis and possible bleeding. Once  these issues resolve, he has a follow-up set up at the [facility] upon discharge.     LOS: 2 days   [facility] 08/29/2015, 7:40 AM

## 2015-08-29 NOTE — Progress Notes (Signed)
CRITICAL VALUE ALERT  Critical value received:  Platelet 26  Date of notification:  08/29/15  Time of notification:  0900  Critical value read back:yes  Nurse who received alert: Lottie Dawson  MD notified (1st page): DR [facility]  Time of first page: 0920  MD notified (2nd page):  Time of second page:  Responding MD:  DR Dyann Kief  Time MD responded: 1000

## 2015-08-29 NOTE — Telephone Encounter (Signed)
Patient's daughter Garner Gavel called asking "When Dr Alen Blew will make his rounds.  I need some type of update or call with update with communication about his labs and pain.  The nurses are making me mad changing his medications.  He is on fentanyl and dilaudid and now they say he received Morphine.  The medicine was working so why did he receive morphine.  I'm leaving the hospital and can be reached [phone].  I return after 6:00 pm. When I'm off work if he rounds at this time."  Advised no morphine is not Hospital medication list.  Hydromorphone is the generic name for Dilaudid and this may be what was communicated.  Will notify Dr. Alen Blew of this request.

## 2015-08-30 DIAGNOSIS — C659 Malignant neoplasm of unspecified renal pelvis: Secondary | ICD-10-CM

## 2015-08-30 DIAGNOSIS — R5081 Fever presenting with conditions classified elsewhere: Secondary | ICD-10-CM

## 2015-08-30 LAB — CBC WITH DIFFERENTIAL/PLATELET
BASOS ABS: 0 10*3/uL (ref 0.0–0.1)
Basophils Relative: 0 %
EOS PCT: 0 %
Eosinophils Absolute: 0 10*3/uL (ref 0.0–0.7)
HEMATOCRIT: 25.6 % — AB (ref 39.0–52.0)
Hemoglobin: 9 g/dL — ABNORMAL LOW (ref 13.0–17.0)
Lymphocytes Relative: 43 %
Lymphs Abs: 0.8 10*3/uL (ref 0.7–4.0)
MCH: 33.5 pg (ref 26.0–34.0)
MCHC: 35.2 g/dL (ref 30.0–36.0)
MCV: 95.2 fL (ref 78.0–100.0)
MONO ABS: 0.1 10*3/uL (ref 0.1–1.0)
MONOS PCT: 6 %
NEUTROS PCT: 51 %
Neutro Abs: 0.9 10*3/uL — ABNORMAL LOW (ref 1.7–7.7)
PLATELETS: 81 10*3/uL — AB (ref 150–400)
RBC: 2.69 MIL/uL — AB (ref 4.22–5.81)
RDW: 12.6 % (ref 11.5–15.5)
WBC: 1.8 10*3/uL — AB (ref 4.0–10.5)

## 2015-08-30 LAB — PREPARE PLATELET PHERESIS
UNIT DIVISION: 0
Unit division: 0

## 2015-08-30 LAB — URINE CULTURE

## 2015-08-30 MED ORDER — POLYETHYLENE GLYCOL 3350 17 G PO PACK
17.0000 g | PACK | Freq: Every day | ORAL | Status: DC
  Administered 2015-08-30 – 2015-09-03 (×5): 17 g via ORAL
  Filled 2015-08-30 (×5): qty 1

## 2015-08-30 MED ORDER — DOCUSATE SODIUM 100 MG PO CAPS
100.0000 mg | ORAL_CAPSULE | Freq: Two times a day (BID) | ORAL | Status: DC
  Administered 2015-08-30 – 2015-09-03 (×8): 100 mg via ORAL
  Filled 2015-08-30 (×8): qty 1

## 2015-08-30 NOTE — Progress Notes (Signed)
[facility] PROGRESS NOTE  PAX MONNIER [id number] DOB: [birth year] DOA: 08/27/2015 PCP: [name], MD  Assessment/Plan: 1-sepsis due to gram neg rods UTI: Present on admission (WBC's 0.8, temperature 101.3; source of infection his urine). With hematuria -will start narrowing abx's; continue just zosyn for now -Follow speciation and sensitivity  -Continue as needed antipyretics -Follow clinical response -avoid heparin products and NSAIDs given hematuria   2-pancytopenia: Secondary to ongoing chemotherapy -Patient has received platelet transfusion; but given ongoing hematuria and dropped into 26,000 range will transfuse as instructed by oncology service and will try to keep it > 50K. (2 more units of platelets has been ordered) -Wbc's continue trending up and per oncology service no need for Neulasta currently  -Hemoglobin stable and no need for transfusion currently -blood cx neg up to date  3-neutropenic fever: -Continue current IV antibiotics (just zosyn now) -follow culture data; negative up to date -[place] 900 -patient has remained afebrile for 96 hours  4-diabetes mellitus: Currently following diet control -will use SSI while inpatient -most recent A1C 5.6  5-CAD: no chest pain -Will continue holding Plavix while actively having hematuria   6-protein calorie-malnutrition: Moderate -Nutrition service on board -will follow rec's for feeding supplements -Patient encouraged to follow good nutrition and hydration   Code Status: Full code Family Communication: Daughter at bedside Disposition Plan: Continue current antibiotic therapy, follow supportive care, remains inpatient. Follow culture data   Consultants:  Oncology service  Procedures:  See below for x-ray reports  Antibiotics:  Vancomycin 08/27/15>>>08/30/15  Zosyn  08/27/15  HPI/Subjective: afebrile, denies chest pain, no shortness of breath. Patient with improvement in his bladder spasm and  with improvement on hematuria.   Objective: Filed Vitals:   08/29/15 2100 08/30/15 0645  BP: 139/63 187/63  Pulse: 69 62  Temp: 98.1 F (36.7 C) 98 F (36.7 C)  Resp: 18 16    Intake/Output Summary (Last 24 hours) at 08/30/15 1101 Last data filed at 08/30/15 0946  Gross per 24 hour  Intake   1393 ml  Output   2020 ml  Net   -627 ml   Filed Weights   08/28/15 0319  Weight: 82.192 kg (181 lb 3.2 oz)    Exam:   General:  Currently afebrile, feeling much better overall and denying any nausea, vomiting, chest pain, shortness of breath or abdominal discomfort. With improved hematuria and improved bladder spasm   Cardiovascular: S1 and S2, positive systolic murmur, no rubs, no gallops  Respiratory: Clear to auscultation bilaterally  Abdomen: soft, NT, ND, positive BS  Musculoskeletal: multiple bruises on his extremities, trace to 1+ edema bilaterally   Data Reviewed: Basic Metabolic Panel:  Recent Labs Lab 08/27/15 1900 08/28/15 0358  NA 138 135  K 4.5 4.2  CL 102 102  CO2 23 23  GLUCOSE 173* 146*  BUN 26* 25*  CREATININE 1.17 0.97  CALCIUM 9.3 8.4*   Liver Function Tests:  Recent Labs Lab 08/27/15 1900  AST 28  ALT 17  ALKPHOS 116  BILITOT 1.5*  PROT 6.9  ALBUMIN 3.2*   CBC:  Recent Labs Lab 08/27/15 2114 08/28/15 0358 08/29/15 0812 08/30/15 0530  WBC 0.7* 1.1* 1.8* 1.8*  NEUTROABS 0.4*  --  1.0* 0.9*  HGB 10.1* 9.6* 9.3* 9.0*  HCT 29.0* 27.9* 27.0* 25.6*  MCV 97.6 98.2 98.9 95.2  PLT 8* 38* 26* 81*    Recent Results (from the past 240 hour(s))  Urine culture     Status: None (Preliminary  result)   Collection Time: 08/27/15  6:41 PM  Result Value Ref Range Status   Specimen Description URINE, CLEAN CATCH  Final   Special Requests NONE  Final   Culture   Final    >=100,000 COLONIES/mL GRAM NEGATIVE RODS Performed at [facility]    Report Status PENDING  Incomplete  Culture, blood (routine x 2)     Status: None (Preliminary  result)   Collection Time: 08/27/15  6:57 PM  Result Value Ref Range Status   Specimen Description BLOOD PORTA CATH  Final   Special Requests BOTTLES DRAWN AEROBIC AND ANAEROBIC 5CC  Final   Culture   Final    NO GROWTH 2 DAYS Performed at [facility]    Report Status PENDING  Incomplete  Culture, blood (routine x 2)     Status: None (Preliminary result)   Collection Time: 08/27/15  8:10 PM  Result Value Ref Range Status   Specimen Description BLOOD RIGHT ARM  Final   Special Requests BOTTLES DRAWN AEROBIC AND ANAEROBIC 5CC  Final   Culture   Final    NO GROWTH 2 DAYS Performed at [facility]    Report Status PENDING  Incomplete     Studies: No results found.  Scheduled Meds: . feeding supplement (GLUCERNA SHAKE)  237 mL Oral TID BM  . piperacillin-tazobactam (ZOSYN)  IV  3.375 g Intravenous 3 times per day  . sodium chloride flush  10-40 mL Intracatheter Q12H   Continuous Infusions: . sodium chloride 75 mL/hr at 08/29/15 0048    Principal Problem:   Thrombocytopenia ([place]) Active Problems:   Diabetes mellitus ([place])   Hypertension   Cancer of renal pelvis ([place])   Coronary artery disease   Hematuria   Pancytopenia ([place])   Malnutrition of moderate degree   Diet-controlled diabetes mellitus ([place])    Time spent: 35 minutes    Barton Dubois  [facility] Pager [phone]. If 7PM-7AM, please contact night-coverage at [email or web address], password [email or web address] 08/30/2015, 11:01 AM  LOS: 3 days

## 2015-08-30 NOTE — Care Management Important Message (Signed)
Important Message  Patient Details IM Ltter given to Nora/Case Manager to present to Patient Name: [name] MRN: [id number] Date of Birth: [birth year]   Medicare Important Message Given:  Yes    Camillo Flaming 08/30/2015, 1:55 [place] Message  Patient Details  Name: [name] MRN: [id number] Date of Birth: [birth year]   Medicare Important Message Given:  Yes    Camillo Flaming 08/30/2015, 1:55 PM

## 2015-08-30 NOTE — Progress Notes (Signed)
IP PROGRESS NOTE  Subjective:   Mr. Stettler continues to improve daily. Reports no bleeding complications overnight. He denied any fevers, chills or chest pain. He is tolerating food fairly well.   Objective:  Vital signs in last 24 hours: Temp:  [97.9 F (36.6 C)-98.8 F (37.1 C)] 98 F (36.7 C) (03/10 0645) Pulse Rate:  [62-69] 62 (03/10 0645) Resp:  [16-18] 16 (03/10 0645) BP: (134-187)/(62-70) 187/63 mmHg (03/10 0645) SpO2:  [95 %-100 %] 99 % (03/10 0645) Weight change:  Last BM Date: 08/26/15  Intake/Output from previous day: 03/09 0701 - 03/10 0700 In: 1393 [P.O.:360; I.V.:500; Blood:533] Out: 900 [Urine:900] Alert, awake gentleman appeared in no distress.  Mouth: mucous membranes moist, no thrush noted. Resp: clear to auscultation bilaterally Cardio: regular rate and rhythm, S1, S2 normal, no murmur, click, rub or gallop GI: soft, non-tender; bowel sounds normal; no masses,  no organomegaly Extremities: extremities normal, atraumatic, no cyanosis or edema  Skin: Ecchymosis noted on his upper extremities:  Portacath site without any erythema, induration or bruising.  Lab Results:  Recent Labs  08/29/15 0812 08/30/15 0530  WBC 1.8* 1.8*  HGB 9.3* 9.0*  HCT 27.0* 25.6*  PLT 26* 81*    BMET  Recent Labs  08/27/15 1900 08/28/15 0358  NA 138 135  K 4.5 4.2  CL 102 102  CO2 23 23  GLUCOSE 173* 146*  BUN 26* 25*  CREATININE 1.17 0.97  CALCIUM 9.3 8.4*    Studies/Results: No results found.  Medications: I have reviewed the patient's current medications.  Assessment/Plan:  82 year old gentleman with the following issues:  1. Neutropenia with sepsis: Blood and urine cultures continues to be negative at this time. I recommend keeping him on intravenous antibiotics to his [place] is above 500 and cultures are negative.  2. Thrombocytopenia: Related to chemotherapy and appears to respond to transfusion. Platelet count have improved with transfusion as  well as bone marrow recovery. Do not recommend any transfusion at this time.  3. 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 If he is fit to receive that, he will receive it at a reduced doses unlikely related eliminated a day 8 of the cycle.  4. Pain at the tip of the penis and hematuria: This have improved at this time and he is no longer reporting hematuria.  5. Disposition: He appears to be close to discharge once his [place] full he recovered. I anticipate discharge in the next 24 to 48 hrs.     LOS: 3 days   [id number] 08/30/2015, 11:21 AM

## 2015-08-30 NOTE — Progress Notes (Signed)
Pharmacy Antibiotic Note  [name] is a 82 y.o. male admitted on 08/27/2015 with sepsis and febrile neutropenia.  Currently on day 5 abx, day 4 vancomycin/Zosyn for same.  GNR in urine.  Plan:  After discussion with Dr. Dyann Kief, will stop vancomycin  Continue Zosyn as ordered; plan to de-escalate pending culture results/sensitivities.  F/u SCr   Height: 5\' 9"  (175.3 cm) Weight: 181 lb 3.2 oz (82.192 kg) IBW/kg (Calculated) : 70.7  Temp (24hrs), Avg:98.2 F (36.8 C), Min:97.9 F (36.6 C), Max:98.8 F (37.1 C)   Recent Labs Lab 08/27/15 1900 08/27/15 1924 08/27/15 2114 08/27/15 2224 08/27/15 2323 08/28/15 0146 08/28/15 0358 08/29/15 0812 08/30/15 0530  WBC  --   --  0.7*  --   --   --  1.1* 1.8* 1.8*  CREATININE 1.17  --   --   --   --   --  0.97  --   --   LATICACIDVEN  --  2.93*  --  0.99 1.3 1.7  --   --   --     Estimated Creatinine Clearance: 57.7 mL/min (by C-G formula based on Cr of 0.97).    Allergies  Allergen Reactions  . Codeine Other (See Comments)    Blisters between fingers  . Docetaxel Rash    Antimicrobials this admission: Ceftriaxone 3/6 >> 3/8 Vanc 3/7 >> 3/10 Zosyn 3/7 >>  Dose adjustments this admission: ---  Microbiology results: 3/7 BCx x 2: ngtd 3/7 UCx:  > 100k GNR  Thank you for allowing pharmacy to be a part of this patient's care.  Reuel Boom, PharmD, BCPS Pager: [phone] 08/30/2015, 10:12 AM

## 2015-08-30 NOTE — Progress Notes (Signed)
Pt not compliant with molasses/ milk enema. He does not want anything up his rectum. Very aggressive about that. He also now refuses heparin sticks. He is willing to drink mag citrate

## 2015-08-31 DIAGNOSIS — R5081 Fever presenting with conditions classified elsewhere: Secondary | ICD-10-CM

## 2015-08-31 DIAGNOSIS — A4151 Sepsis due to Escherichia coli [E. coli]: Secondary | ICD-10-CM

## 2015-08-31 DIAGNOSIS — D709 Neutropenia, unspecified: Secondary | ICD-10-CM | POA: Insufficient documentation

## 2015-08-31 LAB — CBC WITH DIFFERENTIAL/PLATELET
BASOS ABS: 0 10*3/uL (ref 0.0–0.1)
BASOS PCT: 1 %
EOS ABS: 0 10*3/uL (ref 0.0–0.7)
Eosinophils Relative: 1 %
HCT: 27.4 % — ABNORMAL LOW (ref 39.0–52.0)
HEMOGLOBIN: 9.3 g/dL — AB (ref 13.0–17.0)
LYMPHS ABS: 0.8 10*3/uL (ref 0.7–4.0)
LYMPHS PCT: 50 %
MCH: 33.3 pg (ref 26.0–34.0)
MCHC: 33.9 g/dL (ref 30.0–36.0)
MCV: 98.2 fL (ref 78.0–100.0)
MONO ABS: 0.3 10*3/uL (ref 0.1–1.0)
MONOS PCT: 22 %
NEUTROS ABS: 0.4 10*3/uL — AB (ref 1.7–7.7)
NEUTROS PCT: 27 %
Platelets: 90 10*3/uL — ABNORMAL LOW (ref 150–400)
RBC: 2.79 MIL/uL — AB (ref 4.22–5.81)
RDW: 12.7 % (ref 11.5–15.5)
WBC: 1.5 10*3/uL — ABNORMAL LOW (ref 4.0–10.5)

## 2015-08-31 LAB — BASIC METABOLIC PANEL
ANION GAP: 8 (ref 5–15)
BUN: 17 mg/dL (ref 6–20)
CHLORIDE: 100 mmol/L — AB (ref 101–111)
CO2: 23 mmol/L (ref 22–32)
CREATININE: 1.18 mg/dL (ref 0.61–1.24)
Calcium: 8.4 mg/dL — ABNORMAL LOW (ref 8.9–10.3)
GFR calc non Af Amer: 55 mL/min — ABNORMAL LOW (ref >60)
Glucose, Bld: 135 mg/dL — ABNORMAL HIGH (ref 65–99)
POTASSIUM: 3.9 mmol/L (ref 3.5–5.1)
SODIUM: 131 mmol/L — AB (ref 135–145)

## 2015-08-31 MED ORDER — BISACODYL 10 MG RE SUPP
10.0000 mg | Freq: Once | RECTAL | Status: DC
  Filled 2015-08-31: qty 1

## 2015-08-31 MED ORDER — LEVOFLOXACIN 750 MG PO TABS
750.0000 mg | ORAL_TABLET | ORAL | Status: DC
  Administered 2015-08-31: 750 mg via ORAL
  Filled 2015-08-31 (×2): qty 1

## 2015-08-31 NOTE — Progress Notes (Signed)
IP PROGRESS NOTE  Subjective:   Mr. Englert and comfortable this morning and has no complaints. He did receive pain medication and his pain is reasonably controlled. He reports no fevers or chills. Reports no bleeding.   Objective:  Vital signs in last 24 hours: Temp:  [97.8 F (36.6 C)-98.4 F (36.9 C)] 98.3 F (36.8 C) (03/11 0610) Pulse Rate:  [57-64] 61 (03/11 0610) Resp:  [18] 18 (03/11 0610) BP: (140-158)/(79-80) 140/80 mmHg (03/11 0610) SpO2:  [100 %] 100 % (03/11 0610) Weight change:  Last BM Date: 08/26/15  Intake/Output from previous day: 03/10 0701 - 03/11 0700 In: -  Out: 2320 [Urine:2320] Alert, awake gentleman appeared in no distress.  Mouth: mucous membranes moist, no thrush noted. Resp: clear to auscultation bilaterally no wheezes or dullness to percussion. Cardio: regular rate and rhythm, S1, S2 normal, no murmur, click, rub or gallop GI: soft, non-tender; bowel sounds normal; no masses,  no organomegaly Extremities: extremities normal, atraumatic, no cyanosis or edema shifting dullness or ascites. Skin: Ecchymosis noted on his upper extremities:  Portacath site without any erythema, induration or bruising.  Lab Results:  Recent Labs  08/30/15 0530 08/31/15 0646  WBC 1.8* 1.5*  HGB 9.0* 9.3*  HCT 25.6* 27.4*  PLT 81* 90*    BMET  Recent Labs  08/31/15 0646  NA 131*  K 3.9  CL 100*  CO2 23  GLUCOSE 135*  BUN 17  CREATININE 1.18  CALCIUM 8.4*     Medications: I have reviewed the patient's current medications.  .med  Specimen Description URINE, CLEAN CATCH   Special Requests NONE   Culture >=100,000 COLONIES/mL ESCHERICHIA COLI  Performed at [facility]       Report Status 08/30/2015 FINAL   Organism ID, Bacteria ESCHERICHIA COLI           Assessment/Plan:  82 year old gentleman with the following issues:  1. Neutropenia with sepsis: Blood cultures showed no growth and his urine culture showed Escherichia  coli bacteria. He is clinically stable at this time although he remains neutropenic. I agree with continuing antibiotics for the time being and change to oral agent once sensitivities are available. I do not see a need for growth factor support at this time.  2. Thrombocytopenia: Related to chemotherapy and appears to respond to transfusion. Platelet count continues to improve and no need for transfusions. No hematuria noted at this time.  3. 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 If he is fit to receive that, he will receive it at a reduced doses unlikely related eliminated a day 8 of the cycle.  4. Pain at the tip of the penis: Manageable at this time.   5. Disposition: He appears to be close to discharge once his [place] is above 500. He is rather deconditioned and might require short course of rehabilitation prior to going home.     LOS: 4 days   [facility] 08/31/2015, 8:24 AM

## 2015-08-31 NOTE — Progress Notes (Signed)
Utilization review completed.  

## 2015-08-31 NOTE — Progress Notes (Signed)
[facility] PROGRESS NOTE  [name] [id number] DOB: 1930-02-26 DOA: 08/27/2015 PCP: [name], MD  Assessment/Plan: 1-sepsis due to gram neg rods UTI: Present on admission (WBC's 0.8, temperature 101.3; source of infection his urine). With hematuria -will switch to levaquin following sensitivity and afebrile status for > 4 days -Follow speciation and sensitivity  -Continue as needed antipyretics -Follow clinical response -avoid heparin products and NSAIDs given hematuria   2-pancytopenia: Secondary to ongoing chemotherapy -Patient has received Platelets transfusion as instructed by oncology service and with intention to keep above 50K.  -currently Platelets 91,000 -Wbc's continue trending up and per oncology service no need for Neulasta currently  -Hemoglobin stable and no need for transfusion currently -blood cx neg up to date  3-neutropenic fever: -Continue antibiotics; but as per discussion oncology, ok to switch to PO Levaquin  -follow culture data; negative up to date -Colon 400 -patient has remained afebrile for 4 days  4-diabetes mellitus: Currently following diet control -will use SSI while inpatient -most recent A1C 5.6  5-CAD: no chest pain -Will continue holding Plavix while actively having hematuria   6-protein calorie-malnutrition: Moderate -Nutrition service on board -will follow rec's for feeding supplements -Patient encouraged to follow good nutrition and hydration  7-physical deconditioned -PT evaluation requested    Code Status: Full code Family Communication: Daughter at bedside Disposition Plan: Continue current antibiotic therapy, follow supportive care, remains inpatient. Follow culture data   Consultants:  Oncology service  Procedures:  See below for x-ray reports  Antibiotics:  Vancomycin 08/27/15>>>08/30/15  Zosyn  08/27/15  HPI/Subjective: afebrile, denies chest pain, no shortness of breath. Patient complaining  of being weak/deconditioned and complaining of constipation.  Objective: Filed Vitals:   08/31/15 0930 08/31/15 1315  BP: 140/78 138/72  Pulse: 71 77  Temp: 98.6 F (37 C) 98.4 F (36.9 C)  Resp: 18 18    Intake/Output Summary (Last 24 hours) at 08/31/15 1533 Last data filed at 08/31/15 1315  Gross per 24 hour  Intake      0 ml  Output   1250 ml  Net  -1250 ml   Filed Weights   08/28/15 0319  Weight: 82.192 kg (181 lb 3.2 oz)    Exam:   General:  afebrile, feeling much better overall and denying any nausea, vomiting, chest pain, shortness of breath or abdominal discomfort. With significant improvement on hematuria and improved bladder spasm   Cardiovascular: S1 and S2, positive systolic murmur, no rubs, no gallops  Respiratory: Clear to auscultation bilaterally  Abdomen: soft, NT, ND, positive BS  Musculoskeletal: multiple bruises on his extremities, trace to 1+ edema bilaterally   Data Reviewed: Basic Metabolic Panel:  Recent Labs Lab 08/27/15 1900 08/28/15 0358 08/31/15 0646  NA 138 135 131*  K 4.5 4.2 3.9  CL 102 102 100*  CO2 23 23 23   GLUCOSE 173* 146* 135*  BUN 26* 25* 17  CREATININE 1.17 0.97 1.18  CALCIUM 9.3 8.4* 8.4*   Liver Function Tests:  Recent Labs Lab 08/27/15 1900  AST 28  ALT 17  ALKPHOS 116  BILITOT 1.5*  PROT 6.9  ALBUMIN 3.2*   CBC:  Recent Labs Lab 08/27/15 2114 08/28/15 0358 08/29/15 0812 08/30/15 0530 08/31/15 0646  WBC 0.7* 1.1* 1.8* 1.8* 1.5*  NEUTROABS 0.4*  --  1.0* 0.9* 0.4*  HGB 10.1* 9.6* 9.3* 9.0* 9.3*  HCT 29.0* 27.9* 27.0* 25.6* 27.4*  MCV 97.6 98.2 98.9 95.2 98.2  PLT 8* 38* 26* 81* 90*  Recent Results (from the past 240 hour(s))  Urine culture     Status: None   Collection Time: 08/27/15  6:41 PM  Result Value Ref Range Status   Specimen Description URINE, CLEAN CATCH  Final   Special Requests NONE  Final   Culture   Final    >=100,000 COLONIES/mL ESCHERICHIA COLI Performed at [facility]    Report Status 08/30/2015 FINAL  Final   Organism ID, Bacteria ESCHERICHIA COLI  Final      Susceptibility   Escherichia coli - MIC*    AMPICILLIN 16 INTERMEDIATE Intermediate     CEFAZOLIN <=4 SENSITIVE Sensitive     CEFTRIAXONE <=1 SENSITIVE Sensitive     CIPROFLOXACIN 1 SENSITIVE Sensitive     GENTAMICIN <=1 SENSITIVE Sensitive     IMIPENEM <=0.25 SENSITIVE Sensitive     NITROFURANTOIN 32 SENSITIVE Sensitive     TRIMETH/SULFA <=20 SENSITIVE Sensitive     AMPICILLIN/SULBACTAM 4 INTERMEDIATE Intermediate     PIP/TAZO <=4 SENSITIVE Sensitive     * >=100,000 COLONIES/mL ESCHERICHIA COLI  Culture, blood (routine x 2)     Status: None (Preliminary result)   Collection Time: 08/27/15  6:57 PM  Result Value Ref Range Status   Specimen Description BLOOD PORTA CATH  Final   Special Requests BOTTLES DRAWN AEROBIC AND ANAEROBIC 5CC  Final   Culture   Final    NO GROWTH 3 DAYS Performed at [facility]    Report Status PENDING  Incomplete  Culture, blood (routine x 2)     Status: None (Preliminary result)   Collection Time: 08/27/15  8:10 PM  Result Value Ref Range Status   Specimen Description BLOOD RIGHT ARM  Final   Special Requests BOTTLES DRAWN AEROBIC AND ANAEROBIC 5CC  Final   Culture   Final    NO GROWTH 3 DAYS Performed at [facility]    Report Status PENDING  Incomplete     Studies: No results found.  Scheduled Meds: . bisacodyl  10 mg Rectal Once  . docusate sodium  100 mg Oral BID  . feeding supplement (GLUCERNA SHAKE)  237 mL Oral TID BM  . levofloxacin  750 mg Oral Q48H  . polyethylene glycol  17 g Oral Daily  . sodium chloride flush  10-40 mL Intracatheter Q12H   Continuous Infusions: . sodium chloride 75 mL/hr at 08/30/15 2159    Principal Problem:   Thrombocytopenia ([place]) Active Problems:   Diabetes mellitus ([place])   Hypertension   Cancer of renal pelvis ([place])   Coronary artery disease   Hematuria   Pancytopenia ([place])    Malnutrition of moderate degree   Diet-controlled diabetes mellitus ([place])    Time spent: 30 minutes    Barton Dubois  [facility] Pager [phone]. If 7PM-7AM, please contact night-coverage at [email or web address], password [facility] 08/31/2015, 3:33 PM  LOS: 4 days

## 2015-08-31 NOTE — Progress Notes (Signed)
Pharmacy Antibiotic Note  [name] is a 82 y.o. male admitted on 08/27/2015 with sepsis and febrile neutropenia.   Zosyn de-escalated to levaquin po for UTI Plan:  levaquin 750mg  po q48h for CrCL < 76ml/min  F/u SCr   Height: 5\' 9"  (175.3 cm) Weight: 181 lb 3.2 oz (82.192 kg) IBW/kg (Calculated) : 70.7  Temp (24hrs), Avg:98.2 F (36.8 C), Min:97.8 F (36.6 C), Max:98.4 F (36.9 C)   Recent Labs Lab 08/27/15 1900 08/27/15 1924 08/27/15 2114 08/27/15 2224 08/27/15 2323 08/28/15 0146 08/28/15 0358 08/29/15 0812 08/30/15 0530 08/31/15 0646  WBC  --   --  0.7*  --   --   --  1.1* 1.8* 1.8* 1.5*  CREATININE 1.17  --   --   --   --   --  0.97  --   --  1.18  LATICACIDVEN  --  2.93*  --  0.99 1.3 1.7  --   --   --   --     Estimated Creatinine Clearance: 47.4 mL/min (by C-G formula based on Cr of 1.18).    Allergies  Allergen Reactions  . Codeine Other (See Comments)    Blisters between fingers  . Docetaxel Rash    Antimicrobials this admission: Ceftriaxone 3/6 >> 3/8 Vanc 3/7 >> 3/10 Zosyn 3/7 >> 3/10 levaquin 3/11 >>  Dose adjustments this admission: ---  Microbiology results: 3/7 BCx x 2: ngtd 3/7 UCx:  > 100k E Coli  Thank you for allowing pharmacy to be a part of this patient's care.  Dolly Rias RPh 08/31/2015, 9:30 AM Pager [phone]

## 2015-09-01 DIAGNOSIS — R5381 Other malaise: Secondary | ICD-10-CM

## 2015-09-01 LAB — CBC WITH DIFFERENTIAL/PLATELET
BASOS PCT: 1 %
Basophils Absolute: 0 10*3/uL (ref 0.0–0.1)
EOS ABS: 0 10*3/uL (ref 0.0–0.7)
Eosinophils Relative: 1 %
HEMATOCRIT: 25.4 % — AB (ref 39.0–52.0)
Hemoglobin: 8.9 g/dL — ABNORMAL LOW (ref 13.0–17.0)
LYMPHS PCT: 54 %
Lymphs Abs: 1 10*3/uL (ref 0.7–4.0)
MCH: 33 pg (ref 26.0–34.0)
MCHC: 35 g/dL (ref 30.0–36.0)
MCV: 94.1 fL (ref 78.0–100.0)
MONO ABS: 0.6 10*3/uL (ref 0.1–1.0)
Monocytes Relative: 31 %
NEUTROS PCT: 13 %
Neutro Abs: 0.2 10*3/uL — ABNORMAL LOW (ref 1.7–7.7)
PLATELETS: 109 10*3/uL — AB (ref 150–400)
RBC: 2.7 MIL/uL — AB (ref 4.22–5.81)
RDW: 12.6 % (ref 11.5–15.5)
WBC: 1.8 10*3/uL — AB (ref 4.0–10.5)

## 2015-09-01 LAB — BASIC METABOLIC PANEL
ANION GAP: 8 (ref 5–15)
BUN: 13 mg/dL (ref 6–20)
BUN: 13 mg/dL (ref 4–21)
CALCIUM: 8.3 mg/dL — AB (ref 8.9–10.3)
CO2: 25 mmol/L (ref 22–32)
CREATININE: 0.93 mg/dL (ref 0.61–1.24)
Chloride: 102 mmol/L (ref 101–111)
Creatinine: 0.9 mg/dL (ref 0.6–1.3)
GFR calc Af Amer: >60 mL/min (ref >60)
GLUCOSE: 129 mg/dL
GLUCOSE: 129 mg/dL — AB (ref 65–99)
Potassium: 3.5 mmol/L (ref 3.5–5.1)
SODIUM: 135 mmol/L (ref 135–145)
Sodium: 135 mmol/L — AB (ref 137–147)

## 2015-09-01 LAB — CULTURE, BLOOD (ROUTINE X 2)
CULTURE: NO GROWTH
Culture: NO GROWTH

## 2015-09-01 MED ORDER — LEVOFLOXACIN 750 MG PO TABS
750.0000 mg | ORAL_TABLET | Freq: Every day | ORAL | Status: DC
  Administered 2015-09-01 – 2015-09-03 (×3): 750 mg via ORAL
  Filled 2015-09-01 (×4): qty 1

## 2015-09-01 NOTE — Progress Notes (Signed)
Pharmacy Antibiotic Note  [name] is a 82 y.o. male admitted on 08/27/2015 with sepsis and febrile neutropenia.   Zosyn de-escalated to levaquin po for UTI Plan:  Change levaquin 750mg  po q24h for improved CrCL > 31ml/min  F/u SCr   Height: 5\' 9"  (175.3 cm) Weight: 181 lb 3.2 oz (82.192 kg) IBW/kg (Calculated) : 70.7  Temp (24hrs), Avg:98.5 F (36.9 C), Min:98.2 F (36.8 C), Max:98.6 F (37 C)   Recent Labs Lab 08/27/15 1900 08/27/15 1924  08/27/15 2224 08/27/15 2323 08/28/15 0146 08/28/15 0358 08/29/15 0812 08/30/15 0530 08/31/15 0646 09/01/15 0601  WBC  --   --   < >  --   --   --  1.1* 1.8* 1.8* 1.5* 1.8*  CREATININE 1.17  --   --   --   --   --  0.97  --   --  1.18 0.93  LATICACIDVEN  --  2.93*  --  0.99 1.3 1.7  --   --   --   --   --   < > = values in this interval not displayed.  Estimated Creatinine Clearance: 60.2 mL/min (by C-G formula based on Cr of 0.93).    Allergies  Allergen Reactions  . Codeine Other (See Comments)    Blisters between fingers  . Docetaxel Rash    Antimicrobials this admission: Ceftriaxone 3/6 >> 3/8 Vanc 3/7 >> 3/10 Zosyn 3/7 >> 3/10 levaquin 3/11 >>  Dose adjustments this admission: ---  Microbiology results: 3/7 BCx x 2: ngtd 3/7 UCx:  > 100k E Coli  Thank you for allowing pharmacy to be a part of this patient's care.  Dolly Rias RPh 09/01/2015, 12:27 PM Pager [phone]

## 2015-09-01 NOTE — Progress Notes (Signed)
[facility] PROGRESS NOTE  [name] [id number] DOB: [birth year] DOA: 08/27/2015 PCP: [name], MD  Assessment/Plan: 1-sepsis due to gram neg rods UTI: Present on admission (WBC's 0.8, temperature 101.3; source of infection his urine). With hematuria -will switch to levaquin following sensitivity  -patient afebrile  -Continue as needed antipyretics -Follow clinical response -avoid heparin products and NSAIDs given hematuria   2-pancytopenia: Secondary to ongoing chemotherapy -Patient has received Platelets transfusion as instructed by oncology service and with intention to keep above 50K.  -currently Platelets 109,000 -Wbc's stable/trending up slowly and per oncology service no need for Neulasta currently  -Hemoglobin stable and no need for transfusion currently -blood cx neg up to date  3-neutropenic fever: -Continue antibiotics; but as per discussion oncology, ok to switch to PO Levaquin  -follow culture data; negative up to date -Sylvarena 400 -patient has remained afebrile for 4 days  4-diabetes mellitus: Currently following diet control -will use SSI while inpatient -most recent A1C 5.6  5-CAD: no chest pain -Will continue holding Plavix for another 24 hours    6-protein calorie-malnutrition: Moderate -Nutrition service on board -will follow rec's for feeding supplements -Patient encouraged to follow good nutrition and hydration  7-Physical deconditioned -PT evaluation requested  -might need SNF for rehab and conditioning    Code Status: Full code Family Communication: Daughter at bedside Disposition Plan: Continue current antibiotic therapy, follow supportive care, remains inpatient. Follow culture data   Consultants:  Oncology service  Procedures:  See below for x-ray reports  Antibiotics:  Vancomycin 08/27/15>>>08/30/15  Zosyn  08/27/15  HPI/Subjective: afebrile, denies chest pain, no shortness of breath. Patient feeling weak and  deconditioned. Move his bowels on 3/11 (twice). No nausea, no vomiting, no abd pain. Able to urinate after foley removed and no further hematuria reported   Objective: Filed Vitals:   08/31/15 2135 09/01/15 0614  BP: 135/70 128/76  Pulse: 69 66  Temp: 98.6 F (37 C) 98.6 F (37 C)  Resp: 20     Intake/Output Summary (Last 24 hours) at 09/01/15 1008 Last data filed at 08/31/15 1803  Gross per 24 hour  Intake    840 ml  Output   1500 ml  Net   -660 ml   Filed Weights   08/28/15 0319  Weight: 82.192 kg (181 lb 3.2 oz)    Exam:   General:  afebrile, feeling much better overall and denying any nausea, vomiting, chest pain, shortness of breath or abdominal discomfort. With no further hematuria reported. Able to urinate after foley removal and had 2 good BM's on 3/11   Cardiovascular: S1 and S2, positive systolic murmur, no rubs, no gallops  Respiratory: Clear to auscultation bilaterally  Abdomen: soft, NT, ND, positive BS  Musculoskeletal: multiple bruises on his extremities, trace to 1+ edema bilaterally   Data Reviewed: Basic Metabolic Panel:  Recent Labs Lab 08/27/15 1900 08/28/15 0358 08/31/15 0646 09/01/15 0601  NA 138 135 131* 135  K 4.5 4.2 3.9 3.5  CL 102 102 100* 102  CO2 23 23 23 25   GLUCOSE 173* 146* 135* 129*  BUN 26* 25* 17 13  CREATININE 1.17 0.97 1.18 0.93  CALCIUM 9.3 8.4* 8.4* 8.3*   Liver Function Tests:  Recent Labs Lab 08/27/15 1900  AST 28  ALT 17  ALKPHOS 116  BILITOT 1.5*  PROT 6.9  ALBUMIN 3.2*   CBC:  Recent Labs Lab 08/27/15 2114 08/28/15 0358 08/29/15 0812 08/30/15 0530 08/31/15 0646 09/01/15 0601  WBC 0.7*  1.1* 1.8* 1.8* 1.5* 1.8*  NEUTROABS 0.4*  --  1.0* 0.9* 0.4* 0.2*  HGB 10.1* 9.6* 9.3* 9.0* 9.3* 8.9*  HCT 29.0* 27.9* 27.0* 25.6* 27.4* 25.4*  MCV 97.6 98.2 98.9 95.2 98.2 94.1  PLT 8* 38* 26* 81* 90* 109*    Recent Results (from the past 240 hour(s))  Urine culture     Status: None   Collection Time:  08/27/15  6:41 PM  Result Value Ref Range Status   Specimen Description URINE, CLEAN CATCH  Final   Special Requests NONE  Final   Culture   Final    >=100,000 COLONIES/mL ESCHERICHIA COLI Performed at [facility]    Report Status 08/30/2015 FINAL  Final   Organism ID, Bacteria ESCHERICHIA COLI  Final      Susceptibility   Escherichia coli - MIC*    AMPICILLIN 16 INTERMEDIATE Intermediate     CEFAZOLIN <=4 SENSITIVE Sensitive     CEFTRIAXONE <=1 SENSITIVE Sensitive     CIPROFLOXACIN 1 SENSITIVE Sensitive     GENTAMICIN <=1 SENSITIVE Sensitive     IMIPENEM <=0.25 SENSITIVE Sensitive     NITROFURANTOIN 32 SENSITIVE Sensitive     TRIMETH/SULFA <=20 SENSITIVE Sensitive     AMPICILLIN/SULBACTAM 4 INTERMEDIATE Intermediate     PIP/TAZO <=4 SENSITIVE Sensitive     * >=100,000 COLONIES/mL ESCHERICHIA COLI  Culture, blood (routine x 2)     Status: None   Collection Time: 08/27/15  6:57 PM  Result Value Ref Range Status   Specimen Description BLOOD PORTA CATH  Final   Special Requests BOTTLES DRAWN AEROBIC AND ANAEROBIC 5CC  Final   Culture   Final    NO GROWTH 5 DAYS Performed at [facility]    Report Status 09/01/2015 FINAL  Final  Culture, blood (routine x 2)     Status: None   Collection Time: 08/27/15  8:10 PM  Result Value Ref Range Status   Specimen Description BLOOD RIGHT ARM  Final   Special Requests BOTTLES DRAWN AEROBIC AND ANAEROBIC 5CC  Final   Culture   Final    NO GROWTH 5 DAYS Performed at [facility]    Report Status 09/01/2015 FINAL  Final     Studies: No results found.  Scheduled Meds: . bisacodyl  10 mg Rectal Once  . docusate sodium  100 mg Oral BID  . feeding supplement (GLUCERNA SHAKE)  237 mL Oral TID BM  . levofloxacin  750 mg Oral Q48H  . polyethylene glycol  17 g Oral Daily  . sodium chloride flush  10-40 mL Intracatheter Q12H   Continuous Infusions: . sodium chloride 50 mL/hr (08/31/15 1530)    Principal Problem:    Thrombocytopenia ([place]) Active Problems:   Diabetes mellitus ([place])   Hypertension   Cancer of renal pelvis ([place])   Coronary artery disease   Hematuria   Pancytopenia ([name]   Malnutrition of moderate degree   Diet-controlled diabetes mellitus ([place])   Neutropenic fever ([place])    Time spent: 30 minutes    Barton Dubois  [facility] Pager [phone]. If 7PM-7AM, please contact night-coverage at [email or web address], password [facility] 09/01/2015, 10:08 AM  LOS: 5 days

## 2015-09-01 NOTE — Evaluation (Signed)
Physical Therapy Evaluation Patient Details Name: [name] MRN: [id number] DOB: 03/21/1930 Today's Date: 09/01/2015   History of Present Illness  82 yo male admitted with thrombocytopenia, neutropenia with sepsis. Hx of kidney cancer, bladder cancer, A fib, arthritis, HTn, CVA, pacemaker, CKD  Clinical Impression  On eval, pt required Mod assist for bed mobility and Max assist to stand at EOB for ~8 seconds. Pt unable to weightshift to allow for steps. Mobility is significantly limited by pain. Recommend ST rehab at SNF.     Follow Up Recommendations SNF    Equipment Recommendations  None recommended by PT    Recommendations for Other Services       Precautions / Restrictions Precautions Precautions: Fall Restrictions Weight Bearing Restrictions: No      Mobility  Bed Mobility Overal bed mobility: Needs Assistance Bed Mobility: Supine to Sit;Sit to Supine     Supine to sit: Mod assist;HOB elevated Sit to supine: Mod assist;HOB elevated   General bed mobility comments: assist for trunk and bil LEs. Increased time. Utilized bedpad for scooting, positioning.   Transfers Overall transfer level: Needs assistance Equipment used: Rolling walker (2 wheeled) Transfers: Sit to/from Stand;Lateral/Scoot Transfers Sit to Stand: Max assist;From elevated surface        Lateral/Scoot Transfers: Min assist General transfer comment: Assist to rise, stabilize. Pt stood for ~8 seconds. Unable to weight shift to allow for steps. Lateral scoot towards HOB with use of pad  Ambulation/Gait             General Gait Details: NT-pt unable  Stairs            Wheelchair Mobility    Modified Rankin (Stroke Patients Only)       Balance Overall balance assessment: Needs assistance         Standing balance support: Bilateral upper extremity supported;During functional activity Standing balance-Leahy Scale: Poor                                Pertinent Vitals/Pain Pain Assessment: 0-10 Pain Score: 8  Pain Location: bil knees Pain Descriptors / Indicators: Aching;Sore;Tender Pain Intervention(s): Monitored during session;Limited activity within patient's tolerance;Repositioned    Home Living Family/patient expects to be discharged to:: Skilled nursing facility Living Arrangements: Children Available Help at Discharge: Family Type of Home: House Home Access: Stairs to enter Entrance Stairs-Rails: Right Entrance Stairs-Number of Steps: 2 Home Layout: One level Home Equipment: Environmental consultant - 2 wheels;Cane - single point      Prior Function Level of Independence: Needs assistance   Gait / Transfers Assistance Needed: ambulatory with cane/walker at baseline           Hand Dominance        Extremity/Trunk Assessment   Upper Extremity Assessment: Generalized weakness           Lower Extremity Assessment: Generalized weakness (limited by knee pain bilaterally. Edema noted. Knee ROM ~70 degress at EOB)      Cervical / Trunk Assessment: Normal  Communication   Communication: HOH  Cognition Arousal/Alertness: Awake/alert Behavior During Therapy: WFL for tasks assessed/performed Overall Cognitive Status: Within Functional Limits for tasks assessed                      General Comments      Exercises        Assessment/Plan    PT Assessment Patient needs continued PT services  PT  Diagnosis Difficulty walking;Generalized weakness;Acute pain;Abnormality of gait   PT Problem List Decreased strength;Decreased range of motion;Decreased activity tolerance;Decreased balance;Decreased mobility;Pain;Decreased knowledge of use of DME  PT Treatment Interventions DME instruction;Gait training;Therapeutic activities;Patient/family education;Functional mobility training;Balance training;Therapeutic exercise   PT Goals (Current goals can be found in the Care Plan section) Acute Rehab PT Goals Patient Stated  Goal: less pain. to be able to stand/walk PT Goal Formulation: With patient Time For Goal Achievement: 09/15/15 Potential to Achieve Goals: Good    Frequency Min 3X/week   Barriers to discharge        Co-evaluation               End of Session Equipment Utilized During Treatment: Gait belt Activity Tolerance: Patient limited by pain;Patient limited by fatigue Patient left: in bed;with call bell/phone within reach;with bed alarm set           Time: 1051-1109 PT Time Calculation (min) (ACUTE ONLY): 18 min   Charges:   PT Evaluation $PT Eval Moderate Complexity: 1 Procedure     PT G Codes:        Weston Anna, MPT Pager: [phone]

## 2015-09-02 ENCOUNTER — Other Ambulatory Visit: Payer: Medicare Other

## 2015-09-02 ENCOUNTER — Telehealth: Payer: Self-pay | Admitting: *Deleted

## 2015-09-02 NOTE — Clinical Social Work Placement (Signed)
   CLINICAL SOCIAL WORK PLACEMENT  NOTE  Date:  09/02/2015  Patient Details  Name: [name] MRN: [id number] Date of Birth: [birth year]  Clinical Social Work is seeking post-discharge placement for this patient at the [place] level of care (*CSW will initial, date and re-position this form in  chart as items are completed):  Yes   Patient/family provided with [place] Work Department's list of facilities offering this level of care within the geographic area requested by the patient (or if unable, by the patient's family).  Yes   Patient/family informed of their freedom to choose among providers that offer the needed level of care, that participate in Medicare, Medicaid or managed care program needed by the patient, have an available bed and are willing to accept the patient.  Yes   Patient/family informed of [place]'s ownership interest in [facility] and [facility], as well as of the fact that they are under no obligation to receive care at these facilities.  PASRR submitted to EDS on       PASRR number received on       Existing PASRR number confirmed on 09/02/15     FL2 transmitted to all facilities in geographic area requested by pt/family on 09/02/15     FL2 transmitted to all facilities within larger geographic area on       Patient informed that his/her managed care company has contracts with or will negotiate with certain facilities, including the following:            Patient/family informed of bed offers received.  Patient chooses bed at       Physician recommends and patient chooses bed at      Patient to be transferred to   on  .  Patient to be transferred to facility by       Patient family notified on   of transfer.  Name of family member notified:        PHYSICIAN Please sign FL2     Additional Comment:    _______________________________________________ Ladell Pier, LCSW 09/02/2015, 11:05  AM

## 2015-09-02 NOTE — Telephone Encounter (Signed)
FYI Provider from in-patient called asking for explanation of chemotherapy schedule.  "He is being discharged to a facility and I'll need to explain his chemotherapy schedule to them."  Advised t he Gemzar Carboplatin is a three week cycle.  Day one of cycle two begins 09-04-2015,  Day 8 begins 09-11-2015, day 21 is a rest day and all these equal one 21-day cycle.  The next cycle begins 09-25-2015.  Voice understanding and no further questions.

## 2015-09-02 NOTE — NC FL2 (Signed)
[place] LEVEL OF CARE SCREENING TOOL     IDENTIFICATION  Patient Name: [name] Birthdate: 08-27-1929 Sex: male Admission Date (Current Location): 08/27/2015  [facility] and [place] Number:  Herbalist and Address:  [facility],  [place] [address] [place]      Provider Number: [id number]  Attending Physician Name and Address:  Barton Dubois, MD  Relative Name and Phone Number:       Current Level of Care: Hospital Recommended Level of Care: [place] Prior Approval Number:    Date Approved/Denied:   PASRR Number: [id number]  Discharge Plan: SNF    Current Diagnoses: Patient Active Problem List   Diagnosis Date Noted  . Neutropenic fever ([place])   . Malnutrition of moderate degree 08/28/2015  . Diet-controlled diabetes mellitus ([place])   . Coronary artery disease 08/27/2015  . Thrombocytopenia ([place]) 08/27/2015  . Hematuria 08/27/2015  . Pancytopenia ([place]) 08/27/2015  . Sepsis ([place])   . Cancer of renal pelvis ([place]) 07/30/2015  . Chest pain 07/17/2014  . Aortic stenosis 04/29/2013  . Thrombocytosis ([place]) 03/30/2013  . Abnormal urine cytology 05/02/2012  . Acute pulmonary edema ([place]) 10/04/2011  . Alcohol withdrawal delirium ([place]) 10/04/2011  . TIA (transient ischemic attack) 10/03/2011  . Subdural hematoma ([place]) 10/02/2011  . ETOH abuse 10/02/2011  . Permanent atrial fibrillation ([place]) 10/02/2011  . UTI (lower urinary tract infection) 10/02/2011  . Elevated PSA 05/19/2011  . Pacemaker 05/19/2011  . Diabetes mellitus ([place]) 05/19/2011  . Hypertension 05/19/2011  . Dyslipidemia 05/19/2011    Orientation RESPIRATION BLADDER Height & Weight     Self, Time, Situation, Place  Normal Continent Weight: 181 lb 3.2 oz (82.192 kg) Height:  5\' 9"  (175.3 cm)  BEHAVIORAL SYMPTOMS/MOOD NEUROLOGICAL BOWEL NUTRITION STATUS   (NONE)  (NONE) Continent Diet (Diet Heart)  AMBULATORY STATUS COMMUNICATION OF NEEDS  Skin   Extensive Assist Verbally Normal                       Personal Care Assistance Level of Assistance  Bathing, Feeding, Dressing Bathing Assistance: Maximum assistance Feeding assistance: Limited assistance Dressing Assistance: Maximum assistance     Functional Limitations Info  Sight, Hearing, Speech Sight Info: Adequate Hearing Info: Impaired Speech Info: Adequate    SPECIAL CARE FACTORS FREQUENCY  PT (By licensed PT), OT (By licensed OT)     PT Frequency: 5 x a week OT Frequency: 5 x a week            Contractures Contractures Info: Not present    Additional Factors Info  Code Status, Allergies Code Status Info: FULL code status Allergies Info: Codeine, Docetaxel           Current Medications (09/02/2015):  This is the current hospital active medication list Current Facility-Administered Medications  Medication Dose Route Frequency Provider Last Rate Last Dose  . 0.9 %  sodium chloride infusion   Intravenous Continuous Barton Dubois, MD 50 mL/hr at 09/01/15 1118    . acetaminophen (TYLENOL) tablet 650 mg  650 mg Oral Q6H PRN Theressa Millard, MD       Or  . acetaminophen (TYLENOL) suppository 650 mg  650 mg Rectal Q6H PRN Theressa Millard, MD      . alum & mag hydroxide-simeth (MAALOX/MYLANTA) 200-200-20 MG/5ML suspension 30 mL  30 mL Oral Q6H PRN Theressa Millard, MD   30 mL at 08/29/15 2129  . bisacodyl (DULCOLAX) suppository  10 mg  10 mg Rectal Once Barton Dubois, MD   10 mg at 08/31/15 1530  . docusate sodium (COLACE) capsule 100 mg  100 mg Oral BID Barton Dubois, MD   100 mg at 09/01/15 2207  . feeding supplement (GLUCERNA SHAKE) (GLUCERNA SHAKE) liquid 237 mL  237 mL Oral TID BM Clayton Bibles, RD   237 mL at 08/30/15 1541  . fentaNYL (SUBLIMAZE) injection 50 mcg  50 mcg Intravenous Q2H PRN Theressa Millard, MD   50 mcg at 09/01/15 2207  . levofloxacin (LEVAQUIN) tablet 750 mg  750 mg Oral Daily Angela Adam, RPH   750 mg at 09/01/15  1530  . ondansetron (ZOFRAN) tablet 4 mg  4 mg Oral Q6H PRN Theressa Millard, MD       Or  . ondansetron (ZOFRAN) injection 4 mg  4 mg Intravenous Q6H PRN Theressa Millard, MD      . oxybutynin (DITROPAN) tablet 5 mg  5 mg Oral Q8H PRN Barton Dubois, MD   5 mg at 09/01/15 2347  . oxyCODONE (Oxy IR/ROXICODONE) immediate release tablet 5 mg  5 mg Oral Q4H PRN Theressa Millard, MD   5 mg at 09/01/15 2347  . polyethylene glycol (MIRALAX / GLYCOLAX) packet 17 g  17 g Oral Daily Barton Dubois, MD   17 g at 09/01/15 0939  . sodium chloride flush (NS) 0.9 % injection 10-40 mL  10-40 mL Intracatheter Q12H Barton Dubois, MD   30 mL at 09/01/15 2207  . sodium chloride flush (NS) 0.9 % injection 10-40 mL  10-40 mL Intracatheter PRN Barton Dubois, MD   10 mL at 08/29/15 2244     Discharge Medications: Please see discharge summary for a list of discharge medications.  Relevant Imaging Results:  Relevant Lab Results:   Additional Information SSN: [id number]. Pt undergoing chemotherapy at the [facility] following the Gemzar Carboplatin which is a three week cycle. Day one of cycle two begins 09-04-2015, Day 8 begins 09-11-2015, day 21 is a rest day and all these equal one 21-day cycle. The next cycle begins 09-25-2015.   KIDD, SUZANNA A, LCSW

## 2015-09-02 NOTE — Clinical Social Work Note (Signed)
Clinical Social Work Assessment  Patient Details  Name: [name] MRN: [id number] Date of Birth: 01-19-1930  Date of referral:  09/02/15               Reason for consult:  Discharge Planning                Permission sought to share information with:  Family Supports Permission granted to share information::  Yes, Verbal Permission Granted  Name::     Accident::     Relationship::  daughter  Contact Information:  [phone]  Housing/Transportation Living arrangements for the past 2 months:  Single Family Home Source of Information:  Adult Children Patient Interpreter Needed:  None Criminal Activity/Legal Involvement Pertinent to Current Situation/Hospitalization:  No - Comment as needed Significant Relationships:  Adult Children Lives with:  Adult Children Do you feel safe going back to the place where you live?  No Need for family participation in patient care:  Yes (Comment)  Care giving concerns:  Pt admitted from home with pt daughter. Pt daughter works during the day and pt requiring more assistance at this time. Recommendation for SNF.    Social Worker assessment / plan:    CSW received referral for new SNF. Pt sleeping & RN reports that he did not sleep well during the night. RN reports pt daughter involved and best to speak with re: SNF.   CSW contacted pt daughter, [name] via telephone. CSW introduced self and explained role. Discussed recommendation for SNF. Pt daughter expressed understanding and agreeable to SNF search. Pt daughter expressed interest in [facility]. CSW discussed with pt daughter that MD had notified CSW of interest in [place] spoke with [facility], but [place] would not accept pt due to pt need for chemotherapy in outpatient setting. Pt daughter expressed frustration about this & states that she will not send pt to a "dump" facility. CSW explained that CSW can do SNF search in [facility], but if pt daughter  is not agreeable to options then pt daughter needs to be prepared to take pt home with home health services and explained that home health services do not provide 24 hour care.   CSW completed FL2 and initiated SNF search to [facility].   CSW to follow up with pt daughter re: SNF bed offers.  CSW to continue to follow.   Employment status:  Retired Forensic scientist:  Medicare PT Recommendations:  [place] / Referral to community resources:  [place]  Patient/Family's Response to care:  Pt daughter actively involved in pt care. Pt daughter frustrated that [facility] is not an option since pt has been there in the past.   Patient/Family's Understanding of and Emotional Response to Diagnosis, Current Treatment, and Prognosis:  Pt daughter displayed knowledge surrounding pt diagnosis and treatment plan. Pt daughter reports pt is requiring a lot more assist than he did at baseline and eager to get pt stronger.   Emotional Assessment Appearance:    Attitude/Demeanor/Rapport:  Unable to Assess (sleeping; hard of hearing) Affect (typically observed):  Unable to Assess Orientation:  Oriented to Self, Oriented to Place, Oriented to  Time, Oriented to Situation Alcohol / Substance use:  Not Applicable Psych involvement (Current and /or in the community):  No (Comment)  Discharge Needs  Concerns to be addressed:  Discharge Planning Concerns Readmission within the last 30 days:  No Current discharge risk:  Physical Impairment Barriers to  Discharge:      Lenard Simmer 09/02/2015, 10:45 AM  [phone]

## 2015-09-02 NOTE — Clinical Social Work Placement (Signed)
   CLINICAL SOCIAL WORK PLACEMENT  NOTE  Date:  09/02/2015  Patient Details  Name: [name] MRN: [id number] Date of Birth: August 19, 1929  Clinical Social Work is seeking post-discharge placement for this patient at the [place] level of care (*CSW will initial, date and re-position this form in  chart as items are completed):  Yes   Patient/family provided with [place] Work Department's list of facilities offering this level of care within the geographic area requested by the patient (or if unable, by the patient's family).  Yes   Patient/family informed of their freedom to choose among providers that offer the needed level of care, that participate in Medicare, Medicaid or managed care program needed by the patient, have an available bed and are willing to accept the patient.  Yes   Patient/family informed of [place]'s ownership interest in [facility] and [facility], as well as of the fact that they are under no obligation to receive care at these facilities.  PASRR submitted to EDS on       PASRR number received on       Existing PASRR number confirmed on 09/02/15     FL2 transmitted to all facilities in geographic area requested by pt/family on 09/02/15     FL2 transmitted to all facilities within larger geographic area on       Patient informed that his/her managed care company has contracts with or will negotiate with certain facilities, including the following:        Yes   Patient/family informed of bed offers received.  Patient [facility] bed at [facility]     Physician recommends and patient chooses bed at      Patient to be transferred to [facility] on  .  Patient to be transferred to facility by       Patient family notified on   of transfer.  Name of family member notified:        PHYSICIAN Please sign FL2     Additional Comment:    _______________________________________________ Ladell Pier,  LCSW 09/02/2015, 4:31 PM

## 2015-09-02 NOTE — Progress Notes (Signed)
CSW continuing to follow.   CSW provided SNF bed offers to pt daughter.   CSW received notification that [facility] now willing to offer a bed and pt daughter aware.   Pt and pt daughter discussed options as pt daughter had toured other facilities and pt ultimately decided that he wishes to go to [facility] for rehab.  CSW confirmed with [facility] that facility can accept pt tomorrow.   CSW to facilitate pt discharge needs to [facility].   Alison Murray, MSW, [place] Work [phone]

## 2015-09-02 NOTE — Progress Notes (Signed)
[facility] PROGRESS NOTE  VAREN HINK [id number] DOB: [birth year] DOA: 08/27/2015 PCP: [name], MD  Assessment/Plan: 1-sepsis due to gram neg rods UTI: Present on admission (WBC's 0.8, temperature 101.3; source of infection his urine). With hematuria -will switch to levaquin following sensitivity  -patient afebrile  -Continue as needed antipyretics -Follow clinical response -avoid heparin products and NSAIDs given hematuria   2-pancytopenia: Secondary to ongoing chemotherapy -Patient has received Platelets transfusion as instructed by oncology service and with intention to keep above 50K.  -Last Platelets 109,000 (09/01/15) -Wbc's stable/trending up slowly and per oncology service no need for Neulasta currently  -Hemoglobin stable and no need for transfusion currently -blood cx neg up to date -will repeat CBC with diff in am  3-neutropenic fever: -Continue antibiotics; but as per discussion oncology, ok to switch to PO Levaquin  -follow culture data; negative up to date -[place] 400 -patient has remained afebrile for 4 days  4-diabetes mellitus: Currently following diet control -will use SSI while inpatient -most recent A1C 5.6  5-CAD: no chest pain -Will continue holding Plavix for another 24 hours    6-protein calorie-malnutrition: Moderate -Nutrition service on board -will follow rec's for feeding supplements -Patient encouraged to follow good nutrition and hydration  7-Physical deconditioned -PT evaluation requested  -might need SNF for rehab and conditioning    Code Status: Full code Family Communication: Daughter at bedside Disposition Plan: Continue current antibiotic therapy, follow supportive care, remains inpatient. Stable and waiting on bed availability for rehab.   Consultants:  Oncology service  Procedures:  See below for x-ray reports  Antibiotics:  Vancomycin 08/27/15>>>08/30/15  Zosyn  08/27/15>>>09/01/15  Levaquin  3/12  HPI/Subjective: afebrile, denies chest pain, no shortness of breath. Patient is overall stable and w/o any further episode of hematuria.   Objective: Filed Vitals:   09/02/15 1324 09/02/15 1332  BP: 92/32 122/52  Pulse: 69   Temp: 99.8 F (37.7 C)   Resp: 18     Intake/Output Summary (Last 24 hours) at 09/02/15 1547 Last data filed at 09/02/15 1325  Gross per 24 hour  Intake    480 ml  Output    850 ml  Net   -370 ml   Filed Weights   08/28/15 0319  Weight: 82.192 kg (181 lb 3.2 oz)    Exam:   General:  afebrile, feeling much better overall and denying any nausea, vomiting, chest pain, shortness of breath or abdominal discomfort. With no further hematuria reported. Able to urinate after foley removal and had 2 good BM's on 3/11   Cardiovascular: S1 and S2, positive systolic murmur, no rubs, no gallops  Respiratory: Clear to auscultation bilaterally  Abdomen: soft, NT, ND, positive BS  Musculoskeletal: multiple bruises on his extremities, trace to 1+ edema bilaterally   Data Reviewed: Basic Metabolic Panel:  Recent Labs Lab 08/27/15 1900 08/28/15 0358 08/31/15 0646 09/01/15 0601  NA 138 135 131* 135  K 4.5 4.2 3.9 3.5  CL 102 102 100* 102  CO2 23 23 23 25   GLUCOSE 173* 146* 135* 129*  BUN 26* 25* 17 13  CREATININE 1.17 0.97 1.18 0.93  CALCIUM 9.3 8.4* 8.4* 8.3*   Liver Function Tests:  Recent Labs Lab 08/27/15 1900  AST 28  ALT 17  ALKPHOS 116  BILITOT 1.5*  PROT 6.9  ALBUMIN 3.2*   CBC:  Recent Labs Lab 08/27/15 2114 08/28/15 0358 08/29/15 0812 08/30/15 0530 08/31/15 0646 09/01/15 0601  WBC 0.7* 1.1* 1.8* 1.8* 1.5*  1.8*  NEUTROABS 0.4*  --  1.0* 0.9* 0.4* 0.2*  HGB 10.1* 9.6* 9.3* 9.0* 9.3* 8.9*  HCT 29.0* 27.9* 27.0* 25.6* 27.4* 25.4*  MCV 97.6 98.2 98.9 95.2 98.2 94.1  PLT 8* 38* 26* 81* 90* 109*    Recent Results (from the past 240 hour(s))  Urine culture     Status: None   Collection Time: 08/27/15  6:41 PM  Result  Value Ref Range Status   Specimen Description URINE, CLEAN CATCH  Final   Special Requests NONE  Final   Culture   Final    >=100,000 COLONIES/mL ESCHERICHIA COLI Performed at [facility]    Report Status 08/30/2015 FINAL  Final   Organism ID, Bacteria ESCHERICHIA COLI  Final      Susceptibility   Escherichia coli - MIC*    AMPICILLIN 16 INTERMEDIATE Intermediate     CEFAZOLIN <=4 SENSITIVE Sensitive     CEFTRIAXONE <=1 SENSITIVE Sensitive     CIPROFLOXACIN 1 SENSITIVE Sensitive     GENTAMICIN <=1 SENSITIVE Sensitive     IMIPENEM <=0.25 SENSITIVE Sensitive     NITROFURANTOIN 32 SENSITIVE Sensitive     TRIMETH/SULFA <=20 SENSITIVE Sensitive     AMPICILLIN/SULBACTAM 4 INTERMEDIATE Intermediate     PIP/TAZO <=4 SENSITIVE Sensitive     * >=100,000 COLONIES/mL ESCHERICHIA COLI  Culture, blood (routine x 2)     Status: None   Collection Time: 08/27/15  6:57 PM  Result Value Ref Range Status   Specimen Description BLOOD PORTA CATH  Final   Special Requests BOTTLES DRAWN AEROBIC AND ANAEROBIC 5CC  Final   Culture   Final    NO GROWTH 5 DAYS Performed at [facility]    Report Status 09/01/2015 FINAL  Final  Culture, blood (routine x 2)     Status: None   Collection Time: 08/27/15  8:10 PM  Result Value Ref Range Status   Specimen Description BLOOD RIGHT ARM  Final   Special Requests BOTTLES DRAWN AEROBIC AND ANAEROBIC 5CC  Final   Culture   Final    NO GROWTH 5 DAYS Performed at [name]    Report Status 09/01/2015 FINAL  Final     Studies: No results found.  Scheduled Meds: . bisacodyl  10 mg Rectal Once  . docusate sodium  100 mg Oral BID  . feeding supplement (GLUCERNA SHAKE)  237 mL Oral TID BM  . levofloxacin  750 mg Oral Daily  . polyethylene glycol  17 g Oral Daily  . sodium chloride flush  10-40 mL Intracatheter Q12H   Continuous Infusions: . sodium chloride 50 mL/hr at 09/01/15 1118    Principal Problem:   Thrombocytopenia  ([place]) Active Problems:   Diabetes mellitus ([place])   Hypertension   Cancer of renal pelvis ([place])   Coronary artery disease   Hematuria   Pancytopenia ([name]   Malnutrition of moderate degree   Diet-controlled diabetes mellitus ([place])   Neutropenic fever ([place])    Time spent: 30 minutes    Barton Dubois  [facility] Pager [phone]. If 7PM-7AM, please contact night-coverage at [email or web address], password [facility] 09/02/2015, 3:47 PM  LOS: 6 days

## 2015-09-03 LAB — CBC WITH DIFFERENTIAL/PLATELET
Basophils Absolute: 0 10*3/uL (ref 0.0–0.1)
Basophils Relative: 0 %
Eosinophils Absolute: 0 10*3/uL (ref 0.0–0.7)
Eosinophils Relative: 0 %
HCT: 25.2 % — ABNORMAL LOW (ref 39.0–52.0)
Hemoglobin: 8.8 g/dL — ABNORMAL LOW (ref 13.0–17.0)
Lymphocytes Relative: 37 %
Lymphs Abs: 1 10*3/uL (ref 0.7–4.0)
MCH: 34.4 pg — ABNORMAL HIGH (ref 26.0–34.0)
MCHC: 34.9 g/dL (ref 30.0–36.0)
MCV: 98.4 fL (ref 78.0–100.0)
Monocytes Absolute: 1.2 10*3/uL — ABNORMAL HIGH (ref 0.1–1.0)
Monocytes Relative: 41 %
Neutro Abs: 0.6 10*3/uL — ABNORMAL LOW (ref 1.7–7.7)
Neutrophils Relative %: 22 %
Platelets: 192 10*3/uL (ref 150–400)
RBC: 2.56 MIL/uL — ABNORMAL LOW (ref 4.22–5.81)
RDW: 12.9 % (ref 11.5–15.5)
WBC: 2.8 10*3/uL — ABNORMAL LOW (ref 4.0–10.5)

## 2015-09-03 LAB — CBC AND DIFFERENTIAL: WBC: 2.8 10*3/mL

## 2015-09-03 MED ORDER — DOCUSATE SODIUM 100 MG PO CAPS: 100.0000 mg | ORAL_CAPSULE | Freq: Two times a day (BID) | ORAL | Status: DC

## 2015-09-03 MED ORDER — POLYETHYLENE GLYCOL 3350 17 G PO PACK: 17.0000 g | PACK | Freq: Every day | ORAL | Status: AC

## 2015-09-03 MED ORDER — GLUCERNA SHAKE PO LIQD: 237.0000 mL | Freq: Three times a day (TID) | ORAL | Status: DC

## 2015-09-03 MED ORDER — LEVOFLOXACIN 750 MG PO TABS: 750.0000 mg | ORAL_TABLET | Freq: Every day | ORAL | Status: AC

## 2015-09-03 MED ORDER — OXYCODONE HCL 5 MG PO TABS: 5.0000 mg | ORAL_TABLET | Freq: Three times a day (TID) | ORAL | Status: DC | PRN

## 2015-09-03 MED ORDER — OXYBUTYNIN CHLORIDE 5 MG PO TABS: 5.0000 mg | ORAL_TABLET | Freq: Three times a day (TID) | ORAL | Status: DC | PRN

## 2015-09-03 MED ORDER — HEPARIN SOD (PORK) LOCK FLUSH 100 UNIT/ML IV SOLN
500.0000 [IU] | INTRAVENOUS | Status: AC | PRN
  Administered 2015-09-03: 500 [IU]
  Filled 2015-09-03: qty 5

## 2015-09-03 NOTE — Clinical Social Work Placement (Signed)
   CLINICAL SOCIAL WORK PLACEMENT  NOTE  Date:  09/03/2015  Patient Details  Name: [name] MRN: [id number] Date of Birth: 17-Nov-1929  Clinical Social Work is seeking post-discharge placement for this patient at the [place] level of care (*CSW will initial, date and re-position this form in  chart as items are completed):  Yes   Patient/family provided with [place] Work Department's list of facilities offering this level of care within the geographic area requested by the patient (or if unable, by the patient's family).  Yes   Patient/family informed of their freedom to choose among providers that offer the needed level of care, that participate in Medicare, Medicaid or managed care program needed by the patient, have an available bed and are willing to accept the patient.  Yes   Patient/family informed of 's ownership interest in [facility] and [facility], as well as of the fact that they are under no obligation to receive care at these facilities.  PASRR submitted to EDS on       PASRR number received on       Existing PASRR number confirmed on 09/02/15     FL2 transmitted to all facilities in geographic area requested by pt/family on 09/02/15     FL2 transmitted to all facilities within larger geographic area on       Patient informed that his/her managed care company has contracts with or will negotiate with certain facilities, including the following:        Yes   Patient/family informed of bed offers received.  Patient chooses bed at [facility]     Physician recommends and patient chooses bed at      Patient to be transferred to [facility] on 09/03/15.  Patient to be transferred to facility by ambulance Corey Donivin)     Patient family notified on 09/03/15 of transfer.  Name of family member notified:  pt daughter, [name] notified via telephone     PHYSICIAN Please sign FL2     Additional Comment:     _______________________________________________ Ladell Pier, LCSW 09/03/2015, 12:12 PM

## 2015-09-03 NOTE — Care Management Important Message (Signed)
Important Message  Patient Details IM Letter given to Nora/Case Manager to present to Patient Name: JOHNSE ROMANO MRN: [id number] Date of Birth: 03-09-1930   Medicare Important Message Given:  Yes    Camillo Flaming 09/03/2015, 9:10 AMImportant Message  Patient Details  Name: GREYSUN BENSTON MRN: [id number] Date of Birth: 02-20-1930   Medicare Important Message Given:  Yes    Camillo Flaming 09/03/2015, 9:10 AM

## 2015-09-03 NOTE — Discharge Summary (Signed)
Physician Discharge Summary  [name] [id number] DOB: 1929-12-13 DOA: 08/27/2015  PCP: Gabriel Pao, MD  Admit date: 08/27/2015 Discharge date: 09/03/2015  Time spent: 40 minutes  Recommendations for Outpatient Follow-up:  1. Repeat CBC in 5 days with differential to follow WBC's, ANC, Hgb and platelets trend 2. Repeat BMET in 5 days to follow electrolytes and renal function  3. Outpatient follow up with oncology service    Discharge Diagnoses:  Principal Problem:   Thrombocytopenia ([name]) Active Problems:   Diabetes mellitus (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 Discharge to SNF for physical rehabilitation and conditioning   Diet recommendation: heart healthy and modified carbohydrates   Filed Weights   08/28/15 0319  Weight: 82.192 kg (181 lb 3.2 oz)    History of present illness:  As per Dr. Arnoldo Morale H&P written on 08/27/15 82 y.o. male with a history of Bladder Cancer on Palliative Chemo Rx, and last Chemo Rx was 1 week ago who presents to the ED with complaints of fever and chills weakness and multiple areas of bruising on all over hi body over the past 24 hours. He reports being so weak today he could not get out of his chair. He reports having hematuria chronically. He was evaluated in the ED and was found to have a temperature to 103.2 and was pancytopenic, and his Platelets were 8. A Sepsis Workup was started and Oncology was consulted, and advised transfusion of Platelets of which the patient is agreeable. Initially, he had been given IV Rocephin, and the Antibiotics were expanded to IV Vancomycin and Zosyn, and he was placed in Patient Protective Precautions due to Neutropenia.   Hospital Course:  1-sepsis due to gram neg  rods UTI: Present on admission (WBC's 0.8, temperature 101.3; source of infection his urine). With hematuria -will discharge on levaquin following sensitivity and treat for 5 more days to complete antibiotics therapy  -patient afebrile for over 4 days at discharge -Continue as needed antipyretics -avoid NSAIDs given recent hematuria   2-pancytopenia: Secondary to ongoing chemotherapy -Patient has received Platelets transfusion as instructed by oncology service and with intention to keep above 50K.  -Last Platelets 192,000 (09/02/15) -Wbc's stable/trending up slowly and per oncology service no need for Neulasta currently  -Hemoglobin stable and no need for transfusion of RBC's -will recommend repeat CBC with diff in 5 days  3-neutropenic fever: -Continue antibiotics; but as per discussion oncology, ok to switch to PO Levaquin and will treat for 5 more days at discharge to complete antibiotic therapy.  -Blood cx's has remained negative up to date -[place] 600 -patient has remained afebrile for > than 4 days -urine cx demonstrated E. Coli sensitive to Levofloxacin   4-diabetes mellitus: Currently following diet control -most recent A1C 5.6 -continue modified carbohydrates diet   5-CAD: no chest pain -Will resume Plavix at discharge  6-protein calorie-malnutrition: Moderate -Nutrition service consulted during this admission and recommended glucerna TID as part of feeding supplements, will follow rec's -Patient encouraged to follow good nutrition and hydration  7-Physical deconditioned: -PT evaluation requested and found to require SNF for rehabilitation -patient will be transferred to [facility] for rehab   Procedures:  See below for x-ray reports   Consultations:  Oncology service   Discharge  Exam: Filed Vitals:   09/03/15 0100 09/03/15 0543  BP: 184/65 137/61  Pulse: 73 65  Temp: 99.8 F (37.7 C) 100.3 F (37.9 C)  Resp: 18 18    General: afebrile, feeling much  better overall and denying any nausea, vomiting, chest pain, shortness of breath or abdominal discomfort. With no further hematuria reported over 48 hours now. Able to urinate after foley removal without difficulties and in no distress.  Cardiovascular: S1 and S2, positive systolic murmur, no rubs, no gallops  Respiratory: Clear to auscultation bilaterally  Abdomen: soft, NT, ND, positive BS  Musculoskeletal: multiple bruises on his extremities, trace to 1+ edema bilaterally  Discharge Instructions   Discharge Instructions    Diet - low sodium heart healthy    Complete by:  As directed      Discharge instructions    Complete by:  As directed   maintain adequate hydration Repeat CBC with differential in 5 days to follow WBC's, ANC, Hgb and platelets trend Repeat BMET in 5 days to follow electrolytes and renal function  Antibiotics to be continue for 5 more days at discharge  Heart healthy and modified carbohydrates diet Physical therapy and rehabilitation as per SNF protocol Outpatient follow up with oncology service          Current Discharge Medication List    START taking these medications   Details  docusate sodium (COLACE) 100 MG capsule Take 1 capsule (100 mg total) by mouth 2 (two) times daily.    feeding supplement, GLUCERNA SHAKE, (GLUCERNA SHAKE) LIQD Take 237 mLs by mouth 3 (three) times daily between meals.    levofloxacin (LEVAQUIN) 750 MG tablet Take 1 tablet (750 mg total) by mouth daily.    oxybutynin (DITROPAN) 5 MG tablet Take 1 tablet (5 mg total) by mouth every 8 (eight) hours as needed for bladder spasms.    oxyCODONE (OXY IR/ROXICODONE) 5 MG immediate release tablet Take 1 tablet (5 mg total) by mouth every 8 (eight) hours as needed for moderate pain. Qty: 20 tablet, Refills: 0    polyethylene glycol (MIRALAX / GLYCOLAX) packet Take 17 g by mouth daily.      CONTINUE these medications which have NOT CHANGED   Details  acetaminophen (TYLENOL) 325 MG  tablet Take 1-2 tablets (325-650 mg total) by mouth every 4 (four) hours as needed for mild pain.    clopidogrel (PLAVIX) 75 MG tablet Take 1 tablet (75 mg total) by mouth every morning. Reported on 08/20/2015 Qty: 90 tablet, Refills: 3    pantoprazole (PROTONIX) 40 MG tablet Take 1 tablet (40 mg total) by mouth daily. Qty: 30 tablet, Refills: 11    prochlorperazine (COMPAZINE) 10 MG tablet Take 1 tablet (10 mg total) by mouth every 6 (six) hours as needed for nausea or vomiting. Qty: 30 tablet, Refills: 0      STOP taking these medications     ibuprofen (ADVIL,MOTRIN) 200 MG tablet      HYDROcodone-acetaminophen (NORCO) 10-325 MG per tablet      phenazopyridine (PYRIDIUM) 200 MG tablet        Allergies  Allergen Reactions  . Codeine Other (See Comments)    Blisters between fingers  . Docetaxel Rash   Follow-up Information    Call [facility], [place].   Specialty:  Oncology   Why:  office for appointment info/details   Contact information:   [id number] [address] [phone]       The results of significant diagnostics from  this hospitalization (including imaging, microbiology, ancillary and laboratory) are listed below for reference.    Significant Diagnostic Studies: Dg Chest 2 View  08/27/2015  CLINICAL DATA:  Pt c/o fever onset today, subsided now, hematuria today, hx bladder cancer w/ PAC for treatment EXAM: CHEST  2 VIEW COMPARISON:  09/02/2013 FINDINGS: Mild enlargement of the cardiac silhouette. No mediastinal or hilar masses or evidence of adenopathy. Clear lungs.  No pleural effusion or pneumothorax. Right anterior chest wall Port-A-Cath tip projects at the caval atrial junction. Left anterior chest wall pacemaker is stable in well positioned. Bony thorax is demineralized but intact. IMPRESSION: No acute cardiopulmonary disease. Electronically Signed   By: Lajean Manes M.D.   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 The patient requires a porta cath to begin chemotherapy. EXAM: IMPLANTED PORT A CATH PLACEMENT WITH ULTRASOUND AND FLUOROSCOPIC GUIDANCE ANESTHESIA/SEDATION: 4.0 Mg IV Versed; 50 mcg IV Fentanyl Total Moderate Sedation Time:  33 minutes. The patient's level of consciousness and physiologic status were continuously monitored during the procedure by Radiology nursing. Additional Medications: 2 g IV Ancef. As antibiotic prophylaxis, Ancef was ordered pre-procedure and administered intravenously within one hour of incision. FLUOROSCOPY TIME:  54 seconds. PROCEDURE: The procedure, risks, benefits, and alternatives were explained to the patient. Questions regarding the procedure were encouraged and answered. The patient understands and consents to the procedure. A time-out was performed prior to the procedure. The right neck and chest were prepped with chlorhexidine in a sterile fashion, and a sterile drape was applied covering the operative field. Maximum barrier sterile technique with sterile gowns and gloves were used for the procedure. Local anesthesia was provided with 1% lidocaine. Ultrasound was utilized to confirm patency of the right internal jugular vein. After creating a small venotomy incision, a 21 gauge needle was advanced into the right internal jugular vein under direct, real-time ultrasound guidance. Ultrasound image documentation was performed. After securing guidewire access, an 8 Fr dilator was placed. A J-wire was kinked to measure appropriate catheter length. A subcutaneous port pocket was then created along the upper chest wall utilizing sharp and blunt dissection. Portable cautery was utilized. The pocket was irrigated with sterile saline. A single lumen power injectable port was chosen for placement. The 8 Fr catheter was tunneled from the port pocket site to the venotomy incision. The port was placed in the pocket.  External catheter was trimmed to appropriate length based on guidewire measurement. At the venotomy, an 8 Fr peel-away sheath was placed over a guidewire. The catheter was then placed through the sheath and the sheath removed. Final catheter positioning was confirmed and documented with a fluoroscopic spot image. The port was accessed with a needle and aspirated and flushed with heparinized saline. The needle was removed. The venotomy and port pocket incisions were closed with subcutaneous 3-0 Monocryl and subcuticular 4-0 Vicryl. Dermabond was applied to both incisions. COMPLICATIONS: None FINDINGS: After catheter placement, the tip lies at the cavoatrial junction. The catheter aspirates normally and is ready for immediate use. IMPRESSION: Placement of single lumen port a cath via right internal jugular vein. The catheter tip lies at the cavoatrial junction. A power injectable port a cath was placed and is ready for immediate use. Electronically Signed   By: Aletta Edouard M.D.   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 The patient requires a porta cath to begin chemotherapy. EXAM: IMPLANTED PORT A CATH PLACEMENT WITH ULTRASOUND AND FLUOROSCOPIC GUIDANCE ANESTHESIA/SEDATION: 4.0 Mg IV Versed; 50 mcg IV Fentanyl Total Moderate Sedation Time:  33 minutes. The patient's level of consciousness and physiologic status were continuously monitored during the procedure by Radiology nursing. Additional Medications: 2 g IV Ancef. As antibiotic prophylaxis, Ancef was ordered pre-procedure and administered intravenously within one hour of incision. FLUOROSCOPY TIME:  54 seconds. PROCEDURE: The procedure, risks, benefits, and alternatives were explained to the patient. Questions regarding the procedure were encouraged and answered. The patient understands and consents to the procedure. A time-out was performed  prior to the procedure. The right neck and chest were prepped with chlorhexidine in a sterile fashion, and a sterile drape was applied covering the operative field. Maximum barrier sterile technique with sterile gowns and gloves were used for the procedure. Local anesthesia was provided with 1% lidocaine. Ultrasound was utilized to confirm patency of the right internal jugular vein. After creating a small venotomy incision, a 21 gauge needle was advanced into the right internal jugular vein under direct, real-time ultrasound guidance. Ultrasound image documentation was performed. After securing guidewire access, an 8 Fr dilator was placed. A J-wire was kinked to measure appropriate catheter length. A subcutaneous port pocket was then created along the upper chest wall utilizing sharp and blunt dissection. Portable cautery was utilized. The pocket was irrigated with sterile saline. A single lumen power injectable port was chosen for placement. The 8 Fr catheter was tunneled from the port pocket site to the venotomy incision. The port was placed in the pocket. External catheter was trimmed to appropriate length based on guidewire measurement. At the venotomy, an 8 Fr peel-away sheath was placed over a guidewire. The catheter was then placed through the sheath and the sheath removed. Final catheter positioning was confirmed and documented with a fluoroscopic spot image. The port was accessed with a needle and aspirated and flushed with heparinized saline. The needle was removed. The venotomy and port pocket incisions were closed with subcutaneous 3-0 Monocryl and subcuticular 4-0 Vicryl. Dermabond was applied to both incisions. COMPLICATIONS: None FINDINGS: After catheter placement, the tip lies at the cavoatrial junction. The catheter aspirates normally and is ready for immediate use. IMPRESSION: Placement of single lumen port a cath via right internal jugular vein. The catheter tip lies at the cavoatrial junction. A  power injectable port a cath was placed and is ready for immediate use. Electronically Signed   By: Aletta Edouard M.D.   On: 08/06/2015 08:07    Microbiology: Recent Results (from the past 240 hour(s))  Urine culture     Status: None   Collection Time: 08/27/15  6:41 PM  Result Value Ref Range Status   Specimen Description URINE, CLEAN CATCH  Final   Special Requests NONE  Final   Culture   Final    >=100,000 COLONIES/mL ESCHERICHIA COLI Performed at [facility]    Report Status 08/30/2015 FINAL  Final   Organism ID, Bacteria ESCHERICHIA COLI  Final      Susceptibility   Escherichia coli - MIC*    AMPICILLIN 16 INTERMEDIATE Intermediate     CEFAZOLIN <=4 SENSITIVE Sensitive     CEFTRIAXONE <=1 SENSITIVE Sensitive     CIPROFLOXACIN 1 SENSITIVE Sensitive     GENTAMICIN <=1 SENSITIVE Sensitive     IMIPENEM <=0.25 SENSITIVE Sensitive  NITROFURANTOIN 32 SENSITIVE Sensitive     TRIMETH/SULFA <=20 SENSITIVE Sensitive     AMPICILLIN/SULBACTAM 4 INTERMEDIATE Intermediate     PIP/TAZO <=4 SENSITIVE Sensitive     * >=100,000 COLONIES/mL ESCHERICHIA COLI  Culture, blood (routine x 2)     Status: None   Collection Time: 08/27/15  6:57 PM  Result Value Ref Range Status   Specimen Description BLOOD PORTA CATH  Final   Special Requests BOTTLES DRAWN AEROBIC AND ANAEROBIC 5CC  Final   Culture   Final    NO GROWTH 5 DAYS Performed at [facility]    Report Status 09/01/2015 FINAL  Final  Culture, blood (routine x 2)     Status: None   Collection Time: 08/27/15  8:10 PM  Result Value Ref Range Status   Specimen Description BLOOD RIGHT ARM  Final   Special Requests BOTTLES DRAWN AEROBIC AND ANAEROBIC 5CC  Final   Culture   Final    NO GROWTH 5 DAYS Performed at [facility]    Report Status 09/01/2015 FINAL  Final     Labs: Basic Metabolic Panel:  Recent Labs Lab 08/27/15 1900 08/28/15 0358 08/31/15 0646 09/01/15 0601  NA 138 135 131* 135  K 4.5 4.2  3.9 3.5  CL 102 102 100* 102  CO2 23 23 23 25   GLUCOSE 173* 146* 135* 129*  BUN 26* 25* 17 13  CREATININE 1.17 0.97 1.18 0.93  CALCIUM 9.3 8.4* 8.4* 8.3*   Liver Function Tests:  Recent Labs Lab 08/27/15 1900  AST 28  ALT 17  ALKPHOS 116  BILITOT 1.5*  PROT 6.9  ALBUMIN 3.2*   CBC:  Recent Labs Lab 08/29/15 0812 08/30/15 0530 08/31/15 0646 09/01/15 0601 09/03/15 0525  WBC 1.8* 1.8* 1.5* 1.8* 2.8*  NEUTROABS 1.0* 0.9* 0.4* 0.2* 0.6*  HGB 9.3* 9.0* 9.3* 8.9* 8.8*  HCT 27.0* 25.6* 27.4* 25.4* 25.2*  MCV 98.9 95.2 98.2 94.1 98.4  PLT 26* 81* 90* 109* 192    Signed:  Barton Dubois MD.  [facility] 09/03/2015, 9:52 AM

## 2015-09-03 NOTE — Progress Notes (Signed)
Report called to [facility] to nurse that will receive patient.  Patient medicated at 1135 for pain.  Transport has been called.  Patient stable from AM assessment.

## 2015-09-03 NOTE — Progress Notes (Signed)
Patient is set to discharge to [facility] SNF today. Patient & daughter, [name] aware. Discharge packet given to RN, Rollene Fare. [facility] called for transport.     Raynaldo Opitz, [facility] Clinical Social Worker cell #: [phone]

## 2015-09-04 ENCOUNTER — Ambulatory Visit: Payer: Medicare Other | Admitting: Oncology

## 2015-09-04 ENCOUNTER — Non-Acute Institutional Stay (SKILLED_NURSING_FACILITY): Payer: Medicare Other | Admitting: Internal Medicine

## 2015-09-04 ENCOUNTER — Other Ambulatory Visit: Payer: Medicare Other

## 2015-09-04 ENCOUNTER — Ambulatory Visit: Payer: Medicare Other

## 2015-09-04 ENCOUNTER — Encounter: Payer: Self-pay | Admitting: Internal Medicine

## 2015-09-04 DIAGNOSIS — K219 Gastro-esophageal reflux disease without esophagitis: Secondary | ICD-10-CM

## 2015-09-04 DIAGNOSIS — Z8673 Personal history of transient ischemic attack (TIA), and cerebral infarction without residual deficits: Secondary | ICD-10-CM | POA: Diagnosis not present

## 2015-09-04 DIAGNOSIS — I2510 Atherosclerotic heart disease of native coronary artery without angina pectoris: Secondary | ICD-10-CM | POA: Diagnosis not present

## 2015-09-04 DIAGNOSIS — D696 Thrombocytopenia, unspecified: Secondary | ICD-10-CM

## 2015-09-04 DIAGNOSIS — C659 Malignant neoplasm of unspecified renal pelvis: Secondary | ICD-10-CM | POA: Diagnosis not present

## 2015-09-04 DIAGNOSIS — N390 Urinary tract infection, site not specified: Secondary | ICD-10-CM | POA: Diagnosis not present

## 2015-09-04 DIAGNOSIS — K5909 Other constipation: Secondary | ICD-10-CM

## 2015-09-04 DIAGNOSIS — B9620 Unspecified Escherichia coli [E. coli] as the cause of diseases classified elsewhere: Secondary | ICD-10-CM

## 2015-09-04 DIAGNOSIS — R5381 Other malaise: Secondary | ICD-10-CM | POA: Diagnosis not present

## 2015-09-04 DIAGNOSIS — K5900 Constipation, unspecified: Secondary | ICD-10-CM

## 2015-09-04 DIAGNOSIS — D649 Anemia, unspecified: Secondary | ICD-10-CM

## 2015-09-04 DIAGNOSIS — E46 Unspecified protein-calorie malnutrition: Secondary | ICD-10-CM

## 2015-09-04 NOTE — Progress Notes (Signed)
LOCATION: [facility]  PCP: [name], MD   Code Status: Full Code  Goals of care: Advanced Directive information Advanced Directives 08/27/2015  Does patient have an advance directive? No  Type of Advance Directive -  Does patient want to make changes to advanced directive? -  Copy of advanced directive(s) in chart? -  Would patient like information on creating an advanced directive? No - patient declined information       Extended Emergency Contact Information Primary Emergency Contact: Brooks,[name] Address: [address]          [place], [place] [address] [place] Phone: [phone] Work Phone: [phone] Mobile Phone: [phone] Relation: Daughter   Allergies  Allergen Reactions  . Codeine Other (See Comments)    Blisters between fingers  . Docetaxel Rash    Chief Complaint  Patient presents with  . New Admit To SNF    New Admission     HPI:  Patient is a 82 y.o. male seen today for short term rehabilitation post hospital admission from 08/27/15-09/03/15 with sepsis with E.coli UTI and pancytopenia. He was started on antibiotics and required platelet transfusion. He has medical history of bladder cancer on palliative chemotherapy treatment, CAD, HTN, afib among others. He is seen in his room today. He denies any concern this visit.   Review of Systems:  Constitutional: Negative for fever, chills and diaphoresis. Energy level is improving  HENT: Negative for headache, congestion, nasal discharge, hearing loss, sore throat, difficulty swallowing. Positive for dry mouth.   Eyes: Negative for blurred vision, double vision and discharge. Wears glasses.  Respiratory: Negative for cough, shortness of breath and wheezing.   Cardiovascular: Negative for chest pain, palpitations. Positive for leg swelling.  Gastrointestinal: Negative for heartburn, nausea, vomiting, abdominal pain, loss of appetite, melena, diarrhea. Last bowel movement  was 3 days ago.  Genitourinary: Negative for dysuria and flank pain.  Musculoskeletal: Negative for fall in the facility. Positive for lower back pain.  Skin: Negative for itching, rash.  Neurological: Negative for weakness and dizziness. Psychiatric/Behavioral: Negative for depression.   Past Medical History  Diagnosis Date  . Atrial fibrillation ([place]) 1990  . Dyslipidemia   . Prostate hypertrophy   . Arthritis     degenerative-knees  . History of radiation therapy 05/05/07-06/15/07    right parotid through subclavicular region/ mid jugularlymph node chain  . Allergy   . Hypertension     labile  . Heart murmur     2/6 systolic  . History of chemotherapy      Hx carboplatin/74fu  . Xerostomia     limited  . Stroke [facility]) 2003    TIA  . HOH (hard of hearing)   . Pacemaker     2003 vent paced , rate 69  . Chronic kidney disease     positive cytology  . Dry eyes   . GERD (gastroesophageal reflux disease)   . History of radiation therapy 05/05/07-06/15/07    Right parotid/hemineck,supraclavicular  . Coronary artery disease   . Urinary tract infection     hx of   . Bladder cancer ([place]) 2010  . Cancer ([place]) 05/05/07-06/15/07    parotid gland/66.4 GY  . Cancer ([place])     left eyelid   . Tinnitus     right   . Right groin hernia   . Pneumonia     hx of   . Dysrhythmia     HX A FIB  . Skin  cancer     HX BASAL CELL REMOVED  . Diabetes mellitus without complication ([place])     meds d/c  2-3 year ago   Past Surgical History  Procedure Laterality Date  . Pacemaker insertion  1990    last changed 2003  . Cystoscopy  01/2011    neg  . Back surgery    . Parotidectomy      right  . Craniotomy  12/14/2011    Procedure: CRANIOTOMY HEMATOMA EVACUATION SUBDURAL;  Surgeon: Ophelia Charter, MD;  Location: [facility];  Service: Neurosurgery;  Laterality: Left;  LEFT Craniotomy for subdural   . Neurofibroma excision  1980  . Cystoscopy with biopsy  04/01/2012    Procedure:  CYSTOSCOPY WITH BIOPSY;  Surgeon: Claybon Jabs, MD;  [facility];  Service: Urology;  Laterality: N/A;  BLADDER BIOPSIES and bladder washings  . Cystoscopy w/ retrogrades  04/01/2012    Procedure: CYSTOSCOPY WITH RETROGRADE PYELOGRAM;  Surgeon: Claybon Jabs, MD;  [facility];  Service: Urology;  Laterality: Bilateral;  . Ureteroscopy  04/01/2012    Procedure: URETEROSCOPY;  Surgeon: Claybon Jabs, MD;  Location: [facility];  Service: Urology;  Laterality: Right;  . Cystoscopy/retrograde/ureteroscopy  05/02/2012    Procedure: CYSTOSCOPY/RETROGRADE/URETEROSCOPY;  Surgeon: Claybon Jabs, MD;  [facility];  Service: Urology;  Laterality: Bilateral;  CYSTOSCOPY BILATERAL RETROGRADE PYLOGRAM BILATERAL URETEROSCOPY AND BIOPSY    . Cystoscopy with biopsy  05/02/2012    Procedure: CYSTOSCOPY WITH BIOPSY;  Surgeon: Claybon Jabs, MD;  Location: [facility];  Service: Urology;  Laterality: N/A;  . Cystoscopy with stent placement  05/02/2012    Procedure: CYSTOSCOPY WITH STENT PLACEMENT;  Surgeon: Claybon Jabs, MD;  Location: [facility];  Service: Urology;  Laterality: Bilateral;  . Pacemaker generator change  05/23/2002    [place], [employer], serial [id number]  . Lower extremity arterial doppler  09/28/2011    No evidence of arterial insufficiency.   . Cardiovascular stress test  09/05/2009    Pharmalogical stress test w/o chest pain or EKG changes for ischemia  . Cardiac catheterization  09/20/2012    EF 50-55%, moderately calcified aortic valve leaflets, mild-moderate aortic valve stenosis, LA-appendage moderately dilated, moderate regurg of the mitral valve, RA moderately dilated  . Pacemaker generator change N/A 09/01/2013    Procedure: PACEMAKER GENERATOR CHANGE;  Surgeon: Sanda Klein, MD;  Location: [facility];  Service: Cardiovascular;  Laterality: N/A;    . Cystoscopy/retrograde/ureteroscopy Bilateral 01/21/2015    Procedure: CYSTOSCOPY/RETROGRADE/ BLADDER BIOPSY;  Surgeon: Kathie Rhodes, MD;  Location: [facility];  Service: Urology;  Laterality: Bilateral;  . Cystoscopy with ureteroscopy and stent placement Right 02/15/2015    Procedure: CYSTOSCOPY WITH RIGHT URETEROSCOPY, RENAL PELVIC WASHINGS, STENT, RETROGRADE;  Surgeon: Kathie Rhodes, MD;  Location: [facility];  Service: Urology;  Laterality: Right;   Social History:   reports that he has never smoked. He has never used smokeless tobacco. He reports that he drinks about 10.5 oz of alcohol per week. He reports that he does not use illicit drugs.  Family History  Problem Relation Age of Onset  . Heart disease Mother   . Cancer Father     Lung cancer  . Emphysema Brother   . Coronary artery disease Son     Medications:   Medication List       This list is accurate as of: 09/04/15 11:51  AM.  Always use your most recent med list.               acetaminophen 325 MG tablet  Commonly known as:  TYLENOL  Take 1-2 tablets (325-650 mg total) by mouth every 4 (four) hours as needed for mild pain.     clopidogrel 75 MG tablet  Commonly known as:  PLAVIX  Take 75 mg by mouth daily.     docusate sodium 100 MG capsule  Commonly known as:  COLACE  Take 1 capsule (100 mg total) by mouth 2 (two) times daily.     levofloxacin 750 MG tablet  Commonly known as:  LEVAQUIN  Take 1 tablet (750 mg total) by mouth daily.     oxybutynin 5 MG tablet  Commonly known as:  DITROPAN  Take 1 tablet (5 mg total) by mouth every 8 (eight) hours as needed for bladder spasms.     oxyCODONE 5 MG immediate release tablet  Commonly known as:  Oxy IR/ROXICODONE  Take 1 tablet (5 mg total) by mouth every 8 (eight) hours as needed for moderate pain.     pantoprazole 40 MG tablet  Commonly known as:  PROTONIX  Take 40 mg by mouth daily.     polyethylene glycol packet  Commonly known as:  MIRALAX / GLYCOLAX  Take  17 g by mouth daily.     prochlorperazine 10 MG tablet  Commonly known as:  COMPAZINE  Take 10 mg by mouth every 6 (six) hours as needed for nausea or vomiting.     UNABLE TO FIND  Med Name: Med pass 120 mL 3 times daily between meals as a supplement        Immunizations: Immunization History  Administered Date(s) Administered  . Influenza Split 04/04/2012  . Influenza,inj,Quad PF,36+ Mos 03/28/2014  . PPD Test 09/03/2015  . Tdap 08/22/2011     Physical Exam: Filed Vitals:   09/04/15 1139  BP: 139/73  Pulse: 72  Temp: 98.9 F (37.2 C)  TempSrc: Oral  Resp: 18  Height: 5\' 9"  (1.753 m)  Weight: 181 lb (82.101 kg)  SpO2: 97%   Body mass index is 26.72 kg/(m^2).  General- elderly male, well built, frail, in no acute distress Head- normocephalic, atraumatic Nose- no maxillary or frontal sinus tenderness, no nasal discharge Throat- moist mucus membrane Eyes- PERRLA, EOMI, no pallor, no icterus, no discharge, normal conjunctiva, normal sclera Neck- no cervical lymphadenopathy Cardiovascular- normal s1,s2, no murmur, + leg edema Respiratory- bilateral clear to auscultation, no wheeze, no rhonchi, no crackles, no use of accessory muscles Abdomen- bowel sounds present, soft, non tender Musculoskeletal- able to move all 4 extremities, generalized weakness Neurological-  alert and oriented to person, place and time Skin- warm and dry, bruise to both arms Psychiatry- normal mood and affect    Labs reviewed: Basic Metabolic Panel:  Recent Labs  08/28/15 0358 08/31/15 0646 09/01/15 09/01/15 0601  NA 135 131* 135* 135  K 4.2 3.9  --  3.5  CL 102 100*  --  102  CO2 23 23  --  25  GLUCOSE 146* 135*  --  129*  BUN 25* 17 13 13   CREATININE 0.97 1.18 0.9 0.93  CALCIUM 8.4* 8.4*  --  8.3*   Liver Function Tests:  Recent Labs  08/14/15 0941 08/21/15 0951 08/27/15 1900  AST 17 29 28   ALT 9 20 17   ALKPHOS 140 133 116  BILITOT 1.57* 1.83* 1.5*  PROT 8.3 8.0 6.9  ALBUMIN 3.4* 3.3* 3.2*   No results for input(s): LIPASE, AMYLASE in the last 8760 hours. No results for input(s): AMMONIA in the last 8760 hours. CBC:  Recent Labs  08/31/15 0646 09/01/15 0601 09/03/15 09/03/15 0525  WBC 1.5* 1.8* 2.8 2.8*  NEUTROABS 0.4* 0.2*  --  0.6*  HGB 9.3* 8.9*  --  8.8*  HCT 27.4* 25.4*  --  25.2*  MCV 98.2 94.1  --  98.4  PLT 90* 109*  --  192   Cardiac Enzymes: No results for input(s): CKTOTAL, CKMB, CKMBINDEX, TROPONINI in the last 8760 hours. BNP: Invalid input(s): POCBNP CBG:  Recent Labs  02/15/15 1245 02/15/15 1344 08/05/15 1208  GLUCAP 102* 101* 120*    Radiological Exams: Dg Chest 2 View  08/27/2015  CLINICAL DATA:  Pt c/o fever onset today, subsided now, hematuria today, hx bladder cancer w/ PAC for treatment EXAM: CHEST  2 VIEW COMPARISON:  09/02/2013 FINDINGS: Mild enlargement of the cardiac silhouette. No mediastinal or hilar masses or evidence of adenopathy. Clear lungs.  No pleural effusion or pneumothorax. Right anterior chest wall Port-A-Cath tip projects at the caval atrial junction. Left anterior chest wall pacemaker is stable in well positioned. Bony thorax is demineralized but intact. IMPRESSION: No acute cardiopulmonary disease. Electronically Signed   By: Lajean Manes M.D.   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 The patient requires a porta cath to begin chemotherapy. EXAM: IMPLANTED PORT A CATH PLACEMENT WITH ULTRASOUND AND FLUOROSCOPIC GUIDANCE ANESTHESIA/SEDATION: 4.0 Mg IV Versed; 50 mcg IV Fentanyl Total Moderate Sedation Time:  33 minutes. The patient's level of consciousness and physiologic status were continuously monitored during the procedure by Radiology nursing. Additional Medications: 2 g IV Ancef. As antibiotic prophylaxis, Ancef was ordered pre-procedure and administered intravenously within one  hour of incision. FLUOROSCOPY TIME:  54 seconds. PROCEDURE: The procedure, risks, benefits, and alternatives were explained to the patient. Questions regarding the procedure were encouraged and answered. The patient understands and consents to the procedure. A time-out was performed prior to the procedure. The right neck and chest were prepped with chlorhexidine in a sterile fashion, and a sterile drape was applied covering the operative field. Maximum barrier sterile technique with sterile gowns and gloves were used for the procedure. Local anesthesia was provided with 1% lidocaine. Ultrasound was utilized to confirm patency of the right internal jugular vein. After creating a small venotomy incision, a 21 gauge needle was advanced into the right internal jugular vein under direct, real-time ultrasound guidance. Ultrasound image documentation was performed. After securing guidewire access, an 8 Fr dilator was placed. A J-wire was kinked to measure appropriate catheter length. A subcutaneous port pocket was then created along the upper chest wall utilizing sharp and blunt dissection. Portable cautery was utilized. The pocket was irrigated with sterile saline. A single lumen power injectable port was chosen for placement. The 8 Fr catheter was tunneled from the port pocket site to the venotomy incision. The port was placed in the pocket. External catheter was trimmed to appropriate length based on guidewire measurement. At the venotomy, an 8 Fr peel-away sheath was placed over a guidewire. The catheter was then placed through the sheath and the sheath removed. Final catheter positioning was confirmed and documented with a fluoroscopic spot image. The port was accessed with a needle and aspirated and flushed with heparinized saline. The needle was  removed. The venotomy and port pocket incisions were closed with subcutaneous 3-0 Monocryl and subcuticular 4-0 Vicryl. Dermabond was applied to both incisions.  COMPLICATIONS: None FINDINGS: After catheter placement, the tip lies at the cavoatrial junction. The catheter aspirates normally and is ready for immediate use. IMPRESSION: Placement of single lumen port a cath via right internal jugular vein. The catheter tip lies at the cavoatrial junction. A power injectable port a cath was placed and is ready for immediate use. Electronically Signed   By: Aletta Edouard M.D.   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 The patient requires a porta cath to begin chemotherapy. EXAM: IMPLANTED PORT A CATH PLACEMENT WITH ULTRASOUND AND FLUOROSCOPIC GUIDANCE ANESTHESIA/SEDATION: 4.0 Mg IV Versed; 50 mcg IV Fentanyl Total Moderate Sedation Time:  33 minutes. The patient's level of consciousness and physiologic status were continuously monitored during the procedure by Radiology nursing. Additional Medications: 2 g IV Ancef. As antibiotic prophylaxis, Ancef was ordered pre-procedure and administered intravenously within one hour of incision. FLUOROSCOPY TIME:  54 seconds. PROCEDURE: The procedure, risks, benefits, and alternatives were explained to the patient. Questions regarding the procedure were encouraged and answered. The patient understands and consents to the procedure. A time-out was performed prior to the procedure. The right neck and chest were prepped with chlorhexidine in a sterile fashion, and a sterile drape was applied covering the operative field. Maximum barrier sterile technique with sterile gowns and gloves were used for the procedure. Local anesthesia was provided with 1% lidocaine. Ultrasound was utilized to confirm patency of the right internal jugular vein. After creating a small venotomy incision, a 21 gauge needle was advanced into the right internal jugular vein under direct, real-time ultrasound guidance. Ultrasound image documentation was  performed. After securing guidewire access, an 8 Fr dilator was placed. A J-wire was kinked to measure appropriate catheter length. A subcutaneous port pocket was then created along the upper chest wall utilizing sharp and blunt dissection. Portable cautery was utilized. The pocket was irrigated with sterile saline. A single lumen power injectable port was chosen for placement. The 8 Fr catheter was tunneled from the port pocket site to the venotomy incision. The port was placed in the pocket. External catheter was trimmed to appropriate length based on guidewire measurement. At the venotomy, an 8 Fr peel-away sheath was placed over a guidewire. The catheter was then placed through the sheath and the sheath removed. Final catheter positioning was confirmed and documented with a fluoroscopic spot image. The port was accessed with a needle and aspirated and flushed with heparinized saline. The needle was removed. The venotomy and port pocket incisions were closed with subcutaneous 3-0 Monocryl and subcuticular 4-0 Vicryl. Dermabond was applied to both incisions. COMPLICATIONS: None FINDINGS: After catheter placement, the tip lies at the cavoatrial junction. The catheter aspirates normally and is ready for immediate use. IMPRESSION: Placement of single lumen port a cath via right internal jugular vein. The catheter tip lies at the cavoatrial junction. A power injectable port a cath was placed and is ready for immediate use. Electronically Signed   By: Aletta Edouard M.D.   On: 08/06/2015 08:07    Assessment/Plan  Physical deconditioning Will have him work with physical therapy and occupational therapy team to help with gait training and muscle strengthening exercises.fall precautions. Skin care. Encourage to be out of bed.   E.coli uti Asymptomatic.  Continue and complete levaquin on 09/08/15. Maintain hydration  Protein calorie malnutrition Monitor po intake. Get dietary consult. Continue medpass  supplement  Anemia  Monitor cbc with him on plavix  Thrombocytopenia S/p platelet transfusion. platelt count stable at discharge. Monitor platelet  CAD Chest pain free. Continue plavix for now  Chronic Constipation Currently on colace 100 mg bid and miralax daily. Monitor.  gerd Stable continue protonix  TIA Continue plavix  Bladder cancer On palliative treatment and to follow with oncology. Continue oxybutynin and oxyIR 5 mg q8h prn pain     Goals of care: short term rehabilitation   Labs/tests ordered: cbc, cmp 09/09/15  Family/ staff Communication: reviewed care plan with patient and nursing supervisor    Blanchie Serve, MD Internal Medicine [place], [place] [address] Cell Phone (Monday-Friday 8 am - 5 pm): [phone] On Call: [phone] and follow prompts after 5 pm and on weekends Office Phone: [phone] Office Fax: [phone]

## 2015-09-09 ENCOUNTER — Telehealth: Payer: Self-pay | Admitting: Oncology

## 2015-09-09 ENCOUNTER — Non-Acute Institutional Stay (SKILLED_NURSING_FACILITY): Payer: Medicare Other | Admitting: Family

## 2015-09-09 ENCOUNTER — Encounter: Payer: Self-pay | Admitting: Family

## 2015-09-09 DIAGNOSIS — L850 Acquired ichthyosis: Secondary | ICD-10-CM | POA: Diagnosis not present

## 2015-09-09 DIAGNOSIS — L853 Xerosis cutis: Secondary | ICD-10-CM

## 2015-09-09 DIAGNOSIS — R600 Localized edema: Secondary | ICD-10-CM | POA: Diagnosis not present

## 2015-09-09 LAB — HEPATIC FUNCTION PANEL
ALT: 9 U/L — AB (ref 10–40)
AST: 19 U/L (ref 14–40)
Alkaline Phosphatase: 101 U/L (ref 25–125)
Bilirubin, Total: 0.5 mg/dL

## 2015-09-09 LAB — CBC AND DIFFERENTIAL
HCT: 27 % — AB (ref 41–53)
Hemoglobin: 8.7 g/dL — AB (ref 13.5–17.5)
Platelets: 378 10*3/uL (ref 150–399)
WBC: 8.5 10^3/mL

## 2015-09-09 LAB — BASIC METABOLIC PANEL
BUN: 12 mg/dL (ref 4–21)
Creatinine: 1 mg/dL (ref <=1.3)
Glucose: 98 mg/dL
Potassium: 4 mmol/L (ref 3.4–5.3)
Sodium: 138 mmol/L (ref 137–147)

## 2015-09-09 MED ORDER — AQUAPHOR EX OINT: TOPICAL_OINTMENT | Freq: Two times a day (BID) | CUTANEOUS | Status: AC

## 2015-09-09 NOTE — Progress Notes (Signed)
Location:  [facility] Room Number: [id number] Place of Service:  SNF [phone]) Provider: Blanchie Serve, MD   [name], MD  Patient Care Team: [name], MD as PCP - General (Internal Medicine)  Extended Emergency Contact Information Primary Emergency Contact: Brooks,[name] Address: [address]          [place], [place] [address] [place] Phone: [phone] Work Phone: [phone] Mobile Phone: [phone] Relation: Daughter  Code Status: Full Code  Goals of care: Advanced Directive information Advanced Directives 09/09/2015  Does patient have an advance directive? No  Copy of advanced directive(s) in chart? -  Would patient like information on creating an advanced directive? No - patient declined information     Chief Complaint  Patient presents with  . Acute Visit    HPI:  Pt is a 82 y.o. male seen today at [facility] for an acute visit for evaluation of leg edema. He has a history of HTN,CKD, Afib, Stroke, Type 2 DM, bladder CA, GERD among others.He is seen in her room today. He reports worsening swelling on the legs for the past several days. He has been elevating legs in the bed without any relief. He denies any shortness of breath, wheezing or cough.    Past Medical History  Diagnosis Date  . Atrial fibrillation ([place]) 1990  . Dyslipidemia   . Prostate hypertrophy   . Arthritis     degenerative-knees  . History of radiation therapy 05/05/07-06/15/07    right parotid through subclavicular region/ mid jugularlymph node chain  . Allergy   . Hypertension     labile  . Heart murmur     2/6 systolic  . History of chemotherapy      Hx carboplatin/4fu  . Xerostomia     limited  . Stroke [facility]) 2003    TIA  . HOH (hard of hearing)   . Pacemaker     2003 vent paced , rate 69  . Chronic kidney disease     positive cytology  . Dry eyes   . GERD (gastroesophageal reflux disease)   .  History of radiation therapy 05/05/07-06/15/07    Right parotid/hemineck,supraclavicular  . Coronary artery disease   . Urinary tract infection     hx of   . Bladder cancer ([place]) 2010  . Cancer ([place]) 05/05/07-06/15/07    parotid gland/66.4 GY  . Cancer ([place])     left eyelid   . Tinnitus     right   . Right groin hernia   . Pneumonia     hx of   . Dysrhythmia     HX A FIB  . Skin cancer     HX BASAL CELL REMOVED  . Diabetes mellitus without complication ([place])     meds d/c  2-3 year ago   Past Surgical History  Procedure Laterality Date  . Pacemaker insertion  1990    last changed 2003  . Cystoscopy  01/2011    neg  . Back surgery    . Parotidectomy      right  . Craniotomy  12/14/2011    Procedure: CRANIOTOMY HEMATOMA EVACUATION SUBDURAL;  Surgeon: Ophelia Charter, MD;  Location: [facility];  Service: Neurosurgery;  Laterality: Left;  LEFT Craniotomy for subdural   . Neurofibroma excision  1980  . Cystoscopy with biopsy  04/01/2012    Procedure: CYSTOSCOPY WITH BIOPSY;  Surgeon: Claybon Jabs, MD;  Location: [facility];  Service: Urology;  Laterality: N/A;  BLADDER BIOPSIES and bladder washings  . Cystoscopy w/ retrogrades  04/01/2012    Procedure: CYSTOSCOPY WITH RETROGRADE PYELOGRAM;  Surgeon: Claybon Jabs, MD;  Location: [facility];  Service: Urology;  Laterality: Bilateral;  . Ureteroscopy  04/01/2012    Procedure: URETEROSCOPY;  Surgeon: Claybon Jabs, MD;  Location[facility];  Service: Urology;  Laterality: Right;  . Cystoscopy/retrograde/ureteroscopy  05/02/2012    Procedure: CYSTOSCOPY/RETROGRADE/URETEROSCOPY;  Surgeon: Claybon Jabs, MD;  Location: [facility];  Service: Urology;  Laterality: Bilateral;  CYSTOSCOPY BILATERAL RETROGRADE PYLOGRAM BILATERAL URETEROSCOPY AND BIOPSY    . Cystoscopy with biopsy  05/02/2012    Procedure: CYSTOSCOPY WITH BIOPSY;  Surgeon: Claybon Jabs, MD;   Location: [facility];  Service: Urology;  Laterality: N/A;  . Cystoscopy with stent placement  05/02/2012    Procedure: CYSTOSCOPY WITH STENT PLACEMENT;  Surgeon: Claybon Jabs, MD;  Location: [facility];  Service: Urology;  Laterality: Bilateral;  . Pacemaker generator change  05/23/2002    [place], serial [phone]  . Lower extremity arterial doppler  09/28/2011    No evidence of arterial insufficiency.   . Cardiovascular stress test  09/05/2009    Pharmalogical stress test w/o chest pain or EKG changes for ischemia  . Cardiac catheterization  09/20/2012    EF 50-55%, moderately calcified aortic valve leaflets, mild-moderate aortic valve stenosis, LA-appendage moderately dilated, moderate regurg of the mitral valve, RA moderately dilated  . Pacemaker generator change N/A 09/01/2013    Procedure: PACEMAKER GENERATOR CHANGE;  Surgeon: Sanda Klein, MD;  Location: [facility];  Service: Cardiovascular;  Laterality: N/A;  . Cystoscopy/retrograde/ureteroscopy Bilateral 01/21/2015    Procedure: CYSTOSCOPY/RETROGRADE/ BLADDER BIOPSY;  Surgeon: Kathie [place], MD;  Location: [facility];  Service: Urology;  Laterality: Bilateral;  . Cystoscopy with ureteroscopy and stent placement Right 02/15/2015    Procedure: CYSTOSCOPY WITH RIGHT URETEROSCOPY, RENAL PELVIC WASHINGS, STENT, RETROGRADE;  Surgeon: Kathie [place], MD;  Location: [facility];  Service: Urology;  Laterality: Right;    Allergies  Allergen Reactions  . Codeine Other (See Comments)    Blisters between fingers  . Docetaxel Rash      Medication List       This list is accurate as of: 09/09/15  3:08 PM.  Always use your most recent med list.               acetaminophen 325 MG tablet  Commonly known as:  TYLENOL  Take 1-2 tablets (325-650 mg total) by mouth every 4 (four) hours as needed for mild pain.     clopidogrel 75 MG tablet  Commonly known as:  PLAVIX  Take 75 mg by mouth  daily.     docusate sodium 100 MG capsule  Commonly known as:  COLACE  Take 1 capsule (100 mg total) by mouth 2 (two) times daily.     oxybutynin 5 MG tablet  Commonly known as:  DITROPAN  Take 1 tablet (5 mg total) by mouth every 8 (eight) hours as needed for bladder spasms.     oxyCODONE 5 MG immediate release tablet  Commonly known as:  Oxy IR/ROXICODONE  Take 1 tablet (5 mg total) by mouth every 8 (eight) hours as needed for moderate pain.     pantoprazole 40 MG tablet  Commonly known as:  PROTONIX  Take 40 mg by mouth daily.  polyethylene glycol packet  Commonly known as:  MIRALAX / GLYCOLAX  Take 17 g by mouth daily.     prochlorperazine 10 MG tablet  Commonly known as:  COMPAZINE  Take 10 mg by mouth every 6 (six) hours as needed for nausea or vomiting.     UNABLE TO FIND  Med Name: Med pass 120 mL 3 times daily between meals as a supplement        Review of Systems  Constitutional: Negative for fever, chills, activity change and fatigue.  HENT: Negative.   Eyes: Negative.   Respiratory: Negative for cough, chest tightness, shortness of breath and wheezing.   Cardiovascular: Positive for leg swelling. Negative for chest pain and palpitations.  Gastrointestinal: Negative for nausea, vomiting, abdominal pain, diarrhea, constipation and abdominal distention.  Genitourinary: Negative.   Skin: Negative.   Psychiatric/Behavioral: Negative.     Immunization History  Administered Date(s) Administered  . Influenza Split 04/04/2012  . Influenza,inj,Quad PF,36+ Mos 03/28/2014  . PPD Test 09/03/2015  . Tdap 08/22/2011   Pertinent  Health Maintenance Due  Topic Date Due  . FOOT EXAM  12/13/1939  . OPHTHALMOLOGY EXAM  12/13/1939  . URINE MICROALBUMIN  12/13/1939  . PNA vac Low Risk Adult (1 of 2 - PCV13) 12/13/1996  . INFLUENZA VACCINE  01/21/2015  . HEMOGLOBIN A1C  02/28/2016   No flowsheet data found. Functional Status Survey:    Filed Vitals:   09/09/15  1033  BP: 131/76  Pulse: 63  Temp: 97.6 F (36.4 C)  TempSrc: Oral  Resp: 20  Height: 5\' 9"  (1.753 m)  Weight: 181 lb (82.101 kg)  SpO2: 98%   Body mass index is 26.72 kg/(m^2). Physical Exam  Constitutional: He is oriented to person, place, and time. He appears well-developed and well-nourished. No distress.  HENT:  Head: Normocephalic.  Mouth/Throat: Oropharynx is clear and moist.  Eyes: Conjunctivae and EOM are normal. Pupils are equal, round, and reactive to light. No scleral icterus.  Neck: Normal range of motion. No JVD present.  Cardiovascular: Normal rate and intact distal pulses.  Exam reveals no gallop and no friction rub.   Murmur heard. Pulmonary/Chest: Effort normal and breath sounds normal. No respiratory distress. He has no wheezes. He has no rales.  Abdominal: Soft. Bowel sounds are normal. He exhibits no distension and no mass. There is no tenderness. There is no guarding.  Musculoskeletal: Normal range of motion. He exhibits no tenderness.  Bilateral lower extremities edema +3   Lymphadenopathy:    He has no cervical adenopathy.  Neurological: He is oriented to person, place, and time.  Skin: Skin is warm and dry. No rash noted. No pallor.  Bilateral Lower extremities dry,  Flaky skin  Psychiatric: He has a normal mood and affect.    Labs reviewed:  Recent Labs  08/28/15 0358 08/31/15 0646 09/01/15 09/01/15 0601  NA 135 131* 135* 135  K 4.2 3.9  --  3.5  CL 102 100*  --  102  CO2 23 23  --  25  GLUCOSE 146* 135*  --  129*  BUN 25* 17 13 13   CREATININE 0.97 1.18 0.9 0.93  CALCIUM 8.4* 8.4*  --  8.3*    Recent Labs  08/14/15 0941 08/21/15 0951 08/27/15 1900  AST 17 29 28   ALT 9 20 17   ALKPHOS 140 133 116  BILITOT 1.57* 1.83* 1.5*  PROT 8.3 8.0 6.9  ALBUMIN 3.4* 3.3* 3.2*    Recent Labs  08/31/15 [id number]  09/01/15 0601 09/03/15 09/03/15 0525  WBC 1.5* 1.8* 2.8 2.8*  NEUTROABS 0.4* 0.2*  --  0.6*  HGB 9.3* 8.9*  --  8.8*  HCT 27.4*  25.4*  --  25.2*  MCV 98.2 94.1  --  98.4  PLT 90* 109*  --  192   Lab Results  Component Value Date   TSH 2.582 01/07/2012   Lab Results  Component Value Date   HGBA1C 6.3* 08/28/2015   Lab Results  Component Value Date   CHOL 115 10/03/2011   HDL 55 10/03/2011   LDLCALC 47 10/03/2011   TRIG 63 10/03/2011   CHOLHDL 2.1 10/03/2011    Significant Diagnostic Results in last 30 days:  Dg Chest 2 View  08/27/2015  CLINICAL DATA:  Pt c/o fever onset today, subsided now, hematuria today, hx bladder cancer w/ PAC for treatment EXAM: CHEST  2 VIEW COMPARISON:  09/02/2013 FINDINGS: Mild enlargement of the cardiac silhouette. No mediastinal or hilar masses or evidence of adenopathy. Clear lungs.  No pleural effusion or pneumothorax. Right anterior chest wall Port-A-Cath tip projects at the caval atrial junction. Left anterior chest wall pacemaker is stable in well positioned. Bony thorax is demineralized but intact. IMPRESSION: No acute cardiopulmonary disease. Electronically Signed   By: Lajean Manes M.D.   On: 08/27/2015 19:27    Assessment/Plan Localized edema  No shortness of breath or wheezing. Bilat. Lung CTA throughout. +3 pitting edema to bilateral LE's. Ordering Ted hose on in the morning and off at bedtime.   Dermatitis Bilateral LE's skin dry and flaky. Ordering Aquaphor ointment Apply to both legs from feet to knee twice daily X 4 weeks.    Family/ staff Communication: Reviewed plan of care with patient and Pharmacist, hospital.

## 2015-09-09 NOTE — Telephone Encounter (Signed)
Sch appt per nurse pt being d/c from hospital

## 2015-09-10 ENCOUNTER — Other Ambulatory Visit: Payer: Self-pay | Admitting: Oncology

## 2015-09-10 ENCOUNTER — Telehealth: Payer: Self-pay | Admitting: Oncology

## 2015-09-10 NOTE — Telephone Encounter (Signed)
per po to sch pt appt-cld & left a message of appt time & date

## 2015-09-11 ENCOUNTER — Ambulatory Visit: Payer: Medicare Other

## 2015-09-18 ENCOUNTER — Non-Acute Institutional Stay (SKILLED_NURSING_FACILITY): Payer: Medicare Other | Admitting: Family

## 2015-09-18 ENCOUNTER — Encounter: Payer: Self-pay | Admitting: Family

## 2015-09-18 DIAGNOSIS — D696 Thrombocytopenia, unspecified: Secondary | ICD-10-CM | POA: Diagnosis not present

## 2015-09-18 DIAGNOSIS — I482 Chronic atrial fibrillation: Secondary | ICD-10-CM

## 2015-09-18 DIAGNOSIS — I4821 Permanent atrial fibrillation: Secondary | ICD-10-CM

## 2015-09-18 DIAGNOSIS — E440 Moderate protein-calorie malnutrition: Secondary | ICD-10-CM | POA: Diagnosis not present

## 2015-09-18 DIAGNOSIS — R600 Localized edema: Secondary | ICD-10-CM | POA: Diagnosis not present

## 2015-09-18 DIAGNOSIS — R269 Unspecified abnormalities of gait and mobility: Secondary | ICD-10-CM

## 2015-09-18 DIAGNOSIS — C659 Malignant neoplasm of unspecified renal pelvis: Secondary | ICD-10-CM

## 2015-09-18 DIAGNOSIS — I2510 Atherosclerotic heart disease of native coronary artery without angina pectoris: Secondary | ICD-10-CM

## 2015-09-18 DIAGNOSIS — G459 Transient cerebral ischemic attack, unspecified: Secondary | ICD-10-CM | POA: Diagnosis not present

## 2015-09-18 LAB — CUP PACEART REMOTE DEVICE CHECK
Brady Statistic RV Percent Paced: 65 %
Date Time Interrogation Session: 20170228150600
Lead Channel Impedance Value: 684 Ohm
Lead Channel Pacing Threshold Amplitude: 0.9 V
Lead Channel Pacing Threshold Pulse Width: 0.4 ms
Lead Channel Setting Pacing Amplitude: 1.3 V
Lead Channel Setting Sensing Sensitivity: 2.5 mV
MDC IDC LEAD IMPLANT DT: 19900407
MDC IDC LEAD LOCATION: 753860
MDC IDC MSMT BATTERY REMAINING LONGEVITY: 102 mo
MDC IDC MSMT BATTERY REMAINING PERCENTAGE: 100 %
MDC IDC SET LEADCHNL RV PACING PULSEWIDTH: 0.4 ms
Pulse Gen Serial Number: 119617

## 2015-09-18 NOTE — Progress Notes (Signed)
Location:  [facility] Room Number: [id number] Place of Service:  SNF [phone])  Provider: Blanchie Serve, MD   PCP: [name], MD Patient Care Team: [name], MD as PCP - General (Internal Medicine)  Extended Emergency Contact Information Primary Emergency Contact: Brooks,[name] Address: [address]          [place], [place] [address] [place] Phone: [phone] Work Phone: [phone] Mobile Phone: [phone] Relation: Daughter  Code Status:Full Code  Goals of care:  Advanced Directive information Advanced Directives 09/18/2015  Does patient have an advance directive? No  Would patient like information on creating an advanced directive? No - patient declined information     Allergies  Allergen Reactions  . Codeine Other (See Comments)    Blisters between fingers  . Docetaxel Rash    Chief Complaint  Patient presents with  . Discharge Note    HPI:  82 y.o. male seen at [facility] today for discharge home. He has been here short term rehabilitation post hospital admission from 08/27/15-09/03/15 with sepsis with E.coli UTI and pancytopenia. He was started on antibiotics and required platelet transfusion. He has medical history of Thrombocytopenia, bladder cancer on palliative chemotherapy treatment, Pacemaker,CAD, HTN, afib among others. He is seen in his room today. He denies any acute issues this visit. He has worked well with PT/OT now stable for discharge home to continue with PT/OT for ROM, exercise, gait stability and muscle strengthening. He does not require any DME states has own walker.     Past Medical History  Diagnosis Date  . Atrial fibrillation ([place]) 1990  . Dyslipidemia   . Prostate hypertrophy   . Arthritis     degenerative-knees  . History of radiation therapy 05/05/07-06/15/07    right parotid through subclavicular region/ mid jugularlymph node chain  . Allergy   .  Hypertension     labile  . Heart murmur     2/6 systolic  . History of chemotherapy      Hx carboplatin/54fu  . Xerostomia     limited  . Stroke [facility]) 2003    TIA  . HOH (hard of hearing)   . Pacemaker     2003 vent paced , rate 69  . Chronic kidney disease     positive cytology  . Dry eyes   . GERD (gastroesophageal reflux disease)   . History of radiation therapy 05/05/07-06/15/07    Right parotid/hemineck,supraclavicular  . Coronary artery disease   . Urinary tract infection     hx of   . Bladder cancer ([place]) 2010  . Cancer ([place]) 05/05/07-06/15/07    parotid gland/66.4 GY  . Cancer ([place])     left eyelid   . Tinnitus     right   . Right groin hernia   . Pneumonia     hx of   . Dysrhythmia     HX A FIB  . Skin cancer     HX BASAL CELL REMOVED  . Diabetes mellitus without complication ([place])     meds d/c  2-3 year ago    Past Surgical History  Procedure Laterality Date  . Pacemaker insertion  1990    last changed 2003  . Cystoscopy  01/2011    neg  . Back surgery    . Parotidectomy      right  . Craniotomy  12/14/2011    Procedure: CRANIOTOMY HEMATOMA EVACUATION SUBDURAL;  Surgeon: Dellis Filbert  Flo Shanks, MD;  Location: [facility];  Service: Neurosurgery;  Laterality: Left;  LEFT Craniotomy for subdural   . Neurofibroma excision  1980  . Cystoscopy with biopsy  04/01/2012    Procedure: CYSTOSCOPY WITH BIOPSY;  Surgeon: Claybon Jabs, MD;  Location: [facility];  Service: Urology;  Laterality: N/A;  BLADDER BIOPSIES and bladder washings  . Cystoscopy w/ retrogrades  04/01/2012    Procedure: CYSTOSCOPY WITH RETROGRADE PYELOGRAM;  Surgeon: Claybon Jabs, MD;  Location: [facility];  Service: Urology;  Laterality: Bilateral;  . Ureteroscopy  04/01/2012    Procedure: URETEROSCOPY;  Surgeon: Claybon Jabs, MD;  Location: [facility];  Service: Urology;  Laterality: Right;  . Cystoscopy/retrograde/ureteroscopy  05/02/2012     Procedure: CYSTOSCOPY/RETROGRADE/URETEROSCOPY;  Surgeon: Claybon Jabs, MD;  Location: [facility];  Service: Urology;  Laterality: Bilateral;  CYSTOSCOPY BILATERAL RETROGRADE PYLOGRAM BILATERAL URETEROSCOPY AND BIOPSY    . Cystoscopy with biopsy  05/02/2012    Procedure: CYSTOSCOPY WITH BIOPSY;  Surgeon: Claybon Jabs, MD;  Location: [facility];  Service: Urology;  Laterality: N/A;  . Cystoscopy with stent placement  05/02/2012    Procedure: CYSTOSCOPY WITH STENT PLACEMENT;  Surgeon: Claybon Jabs, MD;  Location: [facility];  Service: Urology;  Laterality: Bilateral;  . Pacemaker generator change  05/23/2002    [place], serial [phone]  . Lower extremity arterial doppler  09/28/2011    No evidence of arterial insufficiency.   . Cardiovascular stress test  09/05/2009    Pharmalogical stress test w/o chest pain or EKG changes for ischemia  . Cardiac catheterization  09/20/2012    EF 50-55%, moderately calcified aortic valve leaflets, mild-moderate aortic valve stenosis, LA-appendage moderately dilated, moderate regurg of the mitral valve, RA moderately dilated  . Pacemaker generator change N/A 09/01/2013    Procedure: PACEMAKER GENERATOR CHANGE;  Surgeon: Sanda Klein, MD;  Location: [place] [facility];  Service: Cardiovascular;  Laterality: N/A;  . Cystoscopy/retrograde/ureteroscopy Bilateral 01/21/2015    Procedure: CYSTOSCOPY/RETROGRADE/ BLADDER BIOPSY;  Surgeon: Kathie Rhodes, MD;  Location: [facility];  Service: Urology;  Laterality: Bilateral;  . Cystoscopy with ureteroscopy and stent placement Right 02/15/2015    Procedure: CYSTOSCOPY WITH RIGHT URETEROSCOPY, RENAL PELVIC WASHINGS, STENT, RETROGRADE;  Surgeon: Kathie Rhodes, MD;  Location: [facility];  Service: Urology;  Laterality: Right;      reports that he has never smoked. He has never used smokeless tobacco. He reports that he drinks about 10.5 oz of alcohol per week. He  reports that he does not use illicit drugs. Social History   Social History  . Marital Status: Widowed    Spouse Name: N/A  . Number of Children: N/A  . Years of Education: N/A   Occupational History  . Not on file.   Social History Main Topics  . Smoking status: Never Smoker   . Smokeless tobacco: Never Used  . Alcohol Use: 10.5 oz/week    21 Standard drinks or equivalent per week     Comment: 3 /day( bourbon)  . Drug Use: No  . Sexual Activity: No   Other Topics Concern  . Not on file   Social History Narrative   Functional Status Survey:    Allergies  Allergen Reactions  . Codeine Other (See Comments)    Blisters between fingers  . Docetaxel Rash    Pertinent  Health Maintenance Due  Topic Date Due  .  FOOT EXAM  12/13/1939  . OPHTHALMOLOGY EXAM  12[birth year]13/1939  . URINE MICROALBUMIN  12/13/1941  . PNA vac Low Risk Adult (1 of 2 - PCV13) 12/13/1996  . INFLUENZA VACCINE  01/21/2015  . HEMOGLOBIN A1C  02/28/2016    Medications:   Medication List       This list is accurate as of: 09/18/15 11:56 AM.  Always use your most recent med list.               acetaminophen 325 MG tablet  Commonly known as:  TYLENOL  Take 1-2 tablets (325-650 mg total) by mouth every 4 (four) hours as needed for mild pain.     clopidogrel 75 MG tablet  Commonly known as:  PLAVIX  Take 75 mg by mouth daily. For Afib/TIA     docusate sodium 100 MG capsule  Commonly known as:  COLACE  Take 1 capsule (100 mg total) by mouth 2 (two) times daily.     ferrous sulfate 325 (65 FE) MG tablet  Take 325 mg by mouth daily with breakfast. For anemia.     mineral oil-hydrophilic petrolatum ointment  Apply topically 2 (two) times daily.     oxybutynin 5 MG tablet  Commonly known as:  DITROPAN  Take 1 tablet (5 mg total) by mouth every 8 (eight) hours as needed for bladder spasms.     oxyCODONE 5 MG immediate release tablet  Commonly known as:  Oxy IR/ROXICODONE  Take 1 tablet (5 mg  total) by mouth every 8 (eight) hours as needed for moderate pain.     pantoprazole 40 MG tablet  Commonly known as:  PROTONIX  Take 40 mg by mouth daily. For GERD.     polyethylene glycol packet  Commonly known as:  MIRALAX / GLYCOLAX  Take 17 g by mouth daily.     prochlorperazine 10 MG tablet  Commonly known as:  COMPAZINE  Take 10 mg by mouth every 6 (six) hours as needed for nausea or vomiting.     UNABLE TO FIND  Med Name: Med pass 120 mL 3 times daily between meals as a supplement        Review of Systems  Constitutional: Negative for fever, chills, activity change and fatigue.  HENT: Negative.   Eyes: Negative.   Respiratory: Negative for cough, chest tightness, shortness of breath and wheezing.   Cardiovascular: Positive for leg swelling. Negative for chest pain and palpitations.  Gastrointestinal: Negative for nausea, vomiting, abdominal pain, diarrhea, constipation and abdominal distention.  Genitourinary: Negative.   Skin: Negative.   Psychiatric/Behavioral: Negative.     Filed Vitals:   09/18/15 1131  BP: 133/76  Pulse: 76  Temp: 96.6 F (35.9 C)  TempSrc: Oral  Resp: 16  Height: 5\' 9"  (1.753 m)  Weight: 187 lb 12.8 oz (85.186 kg)   Body mass index is 27.72 kg/(m^2). Physical Exam  Constitutional: He is oriented to person, place, and time. He appears well-developed. No distress.  HENT:  Head: Normocephalic.  Mouth/Throat: Oropharynx is clear and moist.  Eyes: Conjunctivae and EOM are normal. Pupils are equal, round, and reactive to light. Right eye exhibits no discharge. Left eye exhibits no discharge. No scleral icterus.  Neck: Normal range of motion. No JVD present. No thyromegaly present.  Cardiovascular: Normal rate, normal heart sounds and intact distal pulses.  Exam reveals no gallop and no friction rub.   No murmur heard. Pulmonary/Chest: Breath sounds normal. No respiratory distress. He has no wheezes. He  has no rales.  Abdominal: Soft. Bowel  sounds are normal. He exhibits no distension and no mass. There is no tenderness. There is no rebound and no guarding.  Musculoskeletal: Normal range of motion. He exhibits no tenderness.  Bilateral LE's +2 edema.   Lymphadenopathy:    He has no cervical adenopathy.  Neurological: He is oriented to person, place, and time.  Skin: Skin is warm and dry. No rash noted. No erythema. No pallor.  Psychiatric: He has a normal mood and affect.    Labs reviewed: Basic Metabolic Panel:  Recent Labs  08/28/15 0358 08/31/15 0646 09/01/15 09/01/15 0601 09/09/15  NA 135 131* 135* 135 138  K 4.2 3.9  --  3.5 4.0  CL 102 100*  --  102  --   CO2 23 23  --  25  --   GLUCOSE 146* 135*  --  129*  --   BUN 25* 17 13 13 12   CREATININE 0.97 1.18 0.9 0.93 1.0  CALCIUM 8.4* 8.4*  --  8.3*  --    Liver Function Tests:  Recent Labs  08/14/15 0941 08/21/15 0951 08/27/15 1900 09/09/15  AST 17 29 28 19   ALT 9 20 17  9*  ALKPHOS 140 133 116 101  BILITOT 1.57* 1.83* 1.5*  --   PROT 8.3 8.0 6.9  --   ALBUMIN 3.4* 3.3* 3.2*  --    No results for input(s): LIPASE, AMYLASE in the last 8760 hours. No results for input(s): AMMONIA in the last 8760 hours. CBC:  Recent Labs  08/31/15 0646 09/01/15 0601 09/03/15 09/03/15 0525 09/09/15  WBC 1.5* 1.8* 2.8 2.8* 8.5  NEUTROABS 0.4* 0.2*  --  0.6*  --   HGB 9.3* 8.9*  --  8.8* 8.7*  HCT 27.4* 25.4*  --  25.2* 27*  MCV 98.2 94.1  --  98.4  --   PLT 90* 109*  --  192 378   Cardiac Enzymes: No results for input(s): CKTOTAL, CKMB, CKMBINDEX, TROPONINI in the last 8760 hours. BNP: Invalid input(s): POCBNP CBG:  Recent Labs  02/15/15 1245 02/15/15 1344 08/05/15 1208  GLUCAP 102* 101* 120*    Procedures and Imaging Studies During Stay: Dg Chest 2 View  08/27/2015  CLINICAL DATA:  Pt c/o fever onset today, subsided now, hematuria today, hx bladder cancer w/ PAC for treatment EXAM: CHEST  2 VIEW COMPARISON:  09/02/2013 FINDINGS: Mild enlargement of  the cardiac silhouette. No mediastinal or hilar masses or evidence of adenopathy. Clear lungs.  No pleural effusion or pneumothorax. Right anterior chest wall Port-A-Cath tip projects at the caval atrial junction. Left anterior chest wall pacemaker is stable in well positioned. Bony thorax is demineralized but intact. IMPRESSION: No acute cardiopulmonary disease. Electronically Signed   By: Lajean Manes M.D.   On: 08/27/2015 19:27    Assessment/Plan:   Pancytopenia post hospital admission from 08/27/15-09/03/15 with sepsis with E.coli UTI and pancytopenia. He was started on antibiotics and required platelet transfusion.Exam findings negative for bleeding. Has a follow up appointment with Oncologist 09/19/2015. PCP to monitor CBC TIA No recent episodes. Continue on plavix  CAD Chest pain free. Continue to monitor.  Abnormal Gait  S/p post Hosp. admission 08/27/2015-09/03/2015 for sepsis with E-Coli and pancytopenia. He has worked well with PT/OT now stable for discharge home to continue with ROM, exercise, gait stability and muscle strengthening. Malnutrition  Continue with protein supplements. PCP to monitor BMP Localized edema: Bilateral LE's +2 edema. No shortness of breath or  wheezing. Continue with Bilat. Knee high Ted hose on in the morning and off at bedtime. Daily weight.  Cancer of the bladder No hematuria. Continue to follow up with oncologist. Appt 09/19/2015 Afib  Continue on Plavix   Patient is being discharged with the following home health services:   PT/OT for  ROM, exercise, gait stability and muscle strengthening.   Patient is being discharged with the following durable medical equipment:  No DME required has own walker.   Rx written X 1 month supply  Patient has been advised to f/u with their PCP in 1-2 weeks to bring them up to date on their rehab stay.  Social services at facility was responsible for arranging this appointment.  Pt was provided with a 30 day supply of  prescriptions for medications and refills must be obtained from their PCP.  For controlled substances, a more limited supply may be provided adequate until PCP appointment only.  Future labs/tests needed:  CBC, BMP with PCP

## 2015-09-19 ENCOUNTER — Ambulatory Visit (HOSPITAL_BASED_OUTPATIENT_CLINIC_OR_DEPARTMENT_OTHER): Payer: Medicare Other | Admitting: Oncology

## 2015-09-19 ENCOUNTER — Telehealth: Payer: Self-pay | Admitting: Oncology

## 2015-09-19 ENCOUNTER — Other Ambulatory Visit: Payer: Medicare Other

## 2015-09-19 VITALS — BP 152/61 | HR 62 | Temp 98.2°F | Resp 18 | Ht 69.0 in | Wt 188.3 lb

## 2015-09-19 DIAGNOSIS — N3289 Other specified disorders of bladder: Secondary | ICD-10-CM

## 2015-09-19 DIAGNOSIS — C661 Malignant neoplasm of right ureter: Secondary | ICD-10-CM

## 2015-09-19 DIAGNOSIS — D500 Iron deficiency anemia secondary to blood loss (chronic): Secondary | ICD-10-CM | POA: Diagnosis not present

## 2015-09-19 DIAGNOSIS — D630 Anemia in neoplastic disease: Secondary | ICD-10-CM

## 2015-09-19 NOTE — Progress Notes (Signed)
Hematology and Oncology Follow Up Visit  [name] [id number] 08/28/1929 82 y.o. 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 CT scan in January 2017 showed pelvic adenopathy indicating metastatic disease.   Prior Therapy: He is status post cystoscopy and right double-J stent placement in August 2016.  Current therapy: Palliative chemotherapy utilizing carboplatin and gemcitabine cycle one given on 08/14/2015. Cycle 2 to start on 09/25/2015 because of hospitalization and neutropenia.  Interim History:  [name] presents today for a follow-up visit. Since the last visit, he was hospitalized for febrile neutropenia, hematuria and profound thrombocytopenia. He was hospitalized between 08/27/2015 and 09/03/2015. He was discharged to a skilled nursing facility and have been home only today. He is fully recovered at this time from the complications related to cycle 1 of chemotherapy. He is no longer reporting any hematuria or dysuria. He does report discoloration of the color of his stool related to oral iron. He is ambulating without any difficulties using a walker at home.  He reports no nausea, vomiting or abdominal pain. He still have some urinary symptoms are chronic in nature including frequency and nocturia. He reports bladder spasm have significantly improved. His appetite have returned to normal.  He does not report any headaches, blurry vision, syncope or seizures. He does not report any fevers, chills or sweats. Does not report any appetite changes or weight loss. Does not report any chest pain, palpitation, orthopnea but does report leg edema. He does not report any cough, wheezing or hemoptysis. Does not report any nausea, vomiting, constipation or diarrhea. He does not report any skeletal complaints of arthralgias or myalgias.  Does not report any lymphadenopathy or petechiae. Does not report any mood issues. Remaining review of systems unremarkable.   Medications: I have reviewed the patient's current medications.  Current Outpatient Prescriptions  Medication Sig Dispense Refill  . acetaminophen (TYLENOL) 325 MG tablet Take 1-2 tablets (325-650 mg total) by mouth every 4 (four) hours as needed for mild pain.    Marland Kitchen clopidogrel (PLAVIX) 75 MG tablet Take 75 mg by mouth daily. For Afib/TIA    . docusate sodium (COLACE) 100 MG capsule Take 1 capsule (100 mg total) by mouth 2 (two) times daily.    . ferrous sulfate 325 (65 FE) MG tablet Take 325 mg by mouth daily with breakfast. For anemia.    Marland Kitchen mineral oil-hydrophilic petrolatum (AQUAPHOR) ointment Apply topically 2 (two) times daily. 420 g 0  . oxybutynin (DITROPAN) 5 MG tablet Take 1 tablet (5 mg total) by mouth every 8 (eight) hours as needed for bladder spasms.    Marland Kitchen oxyCODONE (OXY IR/ROXICODONE) 5 MG immediate release tablet Take 1 tablet (5 mg total) by mouth every 8 (eight) hours as needed for moderate pain. 20 tablet 0  . pantoprazole (PROTONIX) 40 MG tablet Take 40 mg by mouth daily. For GERD.    Marland Kitchen polyethylene glycol (MIRALAX / GLYCOLAX) packet Take 17 g by mouth daily.    . prochlorperazine (COMPAZINE) 10 MG tablet Take 10 mg by mouth every 6 (six) hours as needed for nausea or vomiting.    Marland Kitchen UNABLE TO FIND Med Name: Med pass 120 mL 3 times daily between meals as a supplement     No current facility-administered medications for this visit.     Allergies:  Allergies  Allergen Reactions  . Codeine Other (See  Comments)    Blisters between fingers  . Docetaxel Rash    Past Medical History, Surgical history, Social history, and Family History were reviewed and updated.   Physical Exam: Blood pressure 152/61, pulse 62, temperature 98.2 F (36.8 C), temperature source Oral, resp. rate 18, height 5\' 9"  (1.753 m), weight 188 lb 4.8 oz (85.412 kg), SpO2 100  %. ECOG: 1 General appearance: alert and cooperative. Appeared comfortable without distress. Head: Normocephalic, without obvious abnormality no ulcers or lesions. Neck: no adenopathy Lymph nodes: Cervical, supraclavicular, and axillary nodes normal. Heart:regular rate and rhythm, S1, S2 normal, no murmur, click, rub or gallop Lung:chest clear, no wheezing, rales, normal symmetric air entry Abdomin: soft, non-tender, without masses or organomegaly no rebound or guarding. EXT:no edema noted. Skin: No petechiae or ecchymosis noted.   Lab Results: Lab Results  Component Value Date   WBC 8.5 09/09/2015   HGB 8.7* 09/09/2015   HCT 27* 09/09/2015   MCV 98.4 09/03/2015   PLT 378 09/09/2015     Chemistry      Component Value Date/Time   NA 138 09/09/2015   NA 135 09/01/2015 0601   NA 135* 08/21/2015 0951   K 4.0 09/09/2015   K 4.4 08/21/2015 0951   CL 102 09/01/2015 0601   CO2 25 09/01/2015 0601   CO2 27 08/21/2015 0951   BUN 12 09/09/2015   BUN 13 09/01/2015 0601   BUN 24.2 08/21/2015 0951   CREATININE 1.0 09/09/2015   CREATININE 0.93 09/01/2015 0601   CREATININE 1.2 08/21/2015 0951   CREATININE 0.92 08/29/2013 1519   GLU 98 09/09/2015      Component Value Date/Time   CALCIUM 8.3* 09/01/2015 0601   CALCIUM 9.7 08/21/2015 0951   ALKPHOS 101 09/09/2015   ALKPHOS 133 08/21/2015 0951   AST 19 09/09/2015   AST 29 08/21/2015 0951   ALT 9* 09/09/2015   ALT 20 08/21/2015 0951   BILITOT 1.5* 08/27/2015 1900   BILITOT 1.83* 08/21/2015 0951       Impression and Plan:   82 year old gentleman with the following issues:  1. 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 CT scan in January 2017 showed pelvic adenopathy indicating metastatic disease.  He is currently receiving salvage chemotherapy with carboplatin and gemcitabine. He He did well with her chemotherapy after day 1 but had a  lot of complications after day 8 including pancytopenia, febrile neutropenia that required hospitalization. He is fully recovered and ready to proceed with cycle 2 of chemotherapy. I given the options of cisplatin and chemotherapy indefinitely but he is wanting to continue at this time.  The plan is to resume cycle 2 of chemotherapy starting on 09/25/2015. He will receive carboplatin and gemcitabine at a reduced dose of close to 20-25% reduction. I will also eliminate day 8 of each cycle of chemotherapy. After she completes 3 cycles total, I will obtain CT scan to determine his response to therapy.  2. IV access: Port-A-Cath inserted without any complications. EMLA creams available to the patient.  3. Nausea prophylaxis: Compazine is available to the patient.  4. Thrombocytopenia: This have resolved at this time. Dose reductions of gemcitabine and carboplatin will ensure no further reductions. His Plavix have been resumed at this time.  5. Bladder spasms: Ditropan seemed to have helped his symptoms.  6. Anemia: Multifactorial in nature related to blood loss as well as chemotherapy-induced anemia. I will check iron stores  as well as consider packed red cell transfusion if his hemoglobin does not improve on oral iron supplement. IV iron can also be used as a possibility.  7. febrile neutropenia: This have resolved at this time. I believe dose reductions were eliminate the possibility of further complications. Growth factor support can be used. Develops neutropenia after cycle 2.  8. Follow-up: Will be in one week to receive chemotherapy and in 2 weeks to assess his tolerance to it.  Zola Button, MD 3/30/20173:42 PM

## 2015-09-19 NOTE — Telephone Encounter (Signed)
Gave and printed appt sched and avs for pt for April °

## 2015-09-20 DIAGNOSIS — N189 Chronic kidney disease, unspecified: Secondary | ICD-10-CM

## 2015-09-20 DIAGNOSIS — E119 Type 2 diabetes mellitus without complications: Secondary | ICD-10-CM

## 2015-09-20 DIAGNOSIS — I2510 Atherosclerotic heart disease of native coronary artery without angina pectoris: Secondary | ICD-10-CM

## 2015-09-20 DIAGNOSIS — M170 Bilateral primary osteoarthritis of knee: Secondary | ICD-10-CM

## 2015-09-20 DIAGNOSIS — I129 Hypertensive chronic kidney disease with stage 1 through stage 4 chronic kidney disease, or unspecified chronic kidney disease: Secondary | ICD-10-CM

## 2015-09-20 DIAGNOSIS — C651 Malignant neoplasm of right renal pelvis: Secondary | ICD-10-CM

## 2015-09-25 ENCOUNTER — Other Ambulatory Visit (HOSPITAL_BASED_OUTPATIENT_CLINIC_OR_DEPARTMENT_OTHER): Payer: Medicare Other

## 2015-09-25 ENCOUNTER — Ambulatory Visit: Payer: Medicare Other | Admitting: Oncology

## 2015-09-25 ENCOUNTER — Ambulatory Visit (HOSPITAL_BASED_OUTPATIENT_CLINIC_OR_DEPARTMENT_OTHER): Payer: Medicare Other

## 2015-09-25 VITALS — BP 171/66 | HR 60 | Temp 98.2°F | Resp 18

## 2015-09-25 DIAGNOSIS — C651 Malignant neoplasm of right renal pelvis: Secondary | ICD-10-CM

## 2015-09-25 DIAGNOSIS — C661 Malignant neoplasm of right ureter: Secondary | ICD-10-CM

## 2015-09-25 DIAGNOSIS — D500 Iron deficiency anemia secondary to blood loss (chronic): Secondary | ICD-10-CM

## 2015-09-25 DIAGNOSIS — Z5111 Encounter for antineoplastic chemotherapy: Secondary | ICD-10-CM | POA: Diagnosis present

## 2015-09-25 LAB — CBC WITH DIFFERENTIAL/PLATELET
BASO%: 0.4 % (ref 0.0–2.0)
BASOS ABS: 0 10*3/uL (ref 0.0–0.1)
EOS ABS: 0.3 10*3/uL (ref 0.0–0.5)
EOS%: 3.5 % (ref 0.0–7.0)
HEMATOCRIT: 33.6 % — AB (ref 38.4–49.9)
HEMOGLOBIN: 11.1 g/dL — AB (ref 13.0–17.1)
LYMPH#: 1.6 10*3/uL (ref 0.9–3.3)
LYMPH%: 22.5 % (ref 14.0–49.0)
MCH: 32.9 pg (ref 27.2–33.4)
MCHC: 33 g/dL (ref 32.0–36.0)
MCV: 99.7 fL — AB (ref 79.3–98.0)
MONO#: 0.7 10*3/uL (ref 0.1–0.9)
MONO%: 9 % (ref 0.0–14.0)
NEUT%: 64.6 % (ref 39.0–75.0)
NEUTROS ABS: 4.7 10*3/uL (ref 1.5–6.5)
PLATELETS: 247 10*3/uL (ref 140–400)
RBC: 3.37 10*6/uL — ABNORMAL LOW (ref 4.20–5.82)
RDW: 14.9 % — AB (ref 11.0–14.6)
WBC: 7.2 10*3/uL (ref 4.0–10.3)

## 2015-09-25 LAB — COMPREHENSIVE METABOLIC PANEL
ALBUMIN: 3.5 g/dL (ref 3.5–5.0)
ALK PHOS: 85 U/L (ref 40–150)
ALT: <9 U/L (ref 0–55)
AST: 15 U/L (ref 5–34)
Anion Gap: 8 mEq/L (ref 3–11)
BILIRUBIN TOTAL: 0.91 mg/dL (ref 0.20–1.20)
BUN: 16.1 mg/dL (ref 7.0–26.0)
CALCIUM: 9.5 mg/dL (ref 8.4–10.4)
CO2: 25 mEq/L (ref 22–29)
Chloride: 106 mEq/L (ref 98–109)
Creatinine: 1.1 mg/dL (ref 0.7–1.3)
EGFR: 65 mL/min/{1.73_m2} — AB (ref >90)
GLUCOSE: 114 mg/dL (ref 70–140)
Potassium: 4.8 mEq/L (ref 3.5–5.1)
SODIUM: 139 meq/L (ref 136–145)
TOTAL PROTEIN: 7.7 g/dL (ref 6.4–8.3)

## 2015-09-25 LAB — FERRITIN: Ferritin: 303 ng/ml (ref 22–316)

## 2015-09-25 LAB — IRON AND TIBC
%SAT: 35 % (ref 20–55)
Iron: 98 ug/dL (ref 42–163)
TIBC: 279 ug/dL (ref 202–409)
UIBC: 180 ug/dL (ref 117–376)

## 2015-09-25 MED ORDER — SODIUM CHLORIDE 0.9 % IV SOLN
800.0000 mg/m2 | Freq: Once | INTRAVENOUS | Status: AC
  Administered 2015-09-25: 1634 mg via INTRAVENOUS
  Filled 2015-09-25: qty 42.98

## 2015-09-25 MED ORDER — CARBOPLATIN CHEMO INTRADERMAL TEST DOSE 100MCG/0.02ML
100.0000 ug | Freq: Once | INTRADERMAL | Status: AC
  Administered 2015-09-25: 100 ug via INTRADERMAL
  Filled 2015-09-25: qty 0.01

## 2015-09-25 MED ORDER — HEPARIN SOD (PORK) LOCK FLUSH 100 UNIT/ML IV SOLN
500.0000 [IU] | Freq: Once | INTRAVENOUS | Status: AC | PRN
  Administered 2015-09-25: 500 [IU]
  Filled 2015-09-25: qty 5

## 2015-09-25 MED ORDER — SODIUM CHLORIDE 0.9% FLUSH
10.0000 mL | INTRAVENOUS | Status: DC | PRN
  Administered 2015-09-25: 10 mL
  Filled 2015-09-25: qty 10

## 2015-09-25 MED ORDER — PALONOSETRON HCL INJECTION 0.25 MG/5ML
INTRAVENOUS | Status: AC
  Filled 2015-09-25: qty 5

## 2015-09-25 MED ORDER — SODIUM CHLORIDE 0.9 % IV SOLN
10.0000 mg | Freq: Once | INTRAVENOUS | Status: AC
  Administered 2015-09-25: 10 mg via INTRAVENOUS
  Filled 2015-09-25: qty 1

## 2015-09-25 MED ORDER — SODIUM CHLORIDE 0.9 % IV SOLN
Freq: Once | INTRAVENOUS | Status: AC
  Administered 2015-09-25: 11:00:00 via INTRAVENOUS

## 2015-09-25 MED ORDER — SODIUM CHLORIDE 0.9 % IV SOLN
260.0000 mg | Freq: Once | INTRAVENOUS | Status: AC
  Administered 2015-09-25: 260 mg via INTRAVENOUS
  Filled 2015-09-25: qty 26

## 2015-09-25 MED ORDER — PALONOSETRON HCL INJECTION 0.25 MG/5ML
0.2500 mg | Freq: Once | INTRAVENOUS | Status: AC
  Administered 2015-09-25: 0.25 mg via INTRAVENOUS

## 2015-09-25 NOTE — Patient Instructions (Addendum)
[facility] Discharge Instructions for Patients Receiving Chemotherapy  Today you received the following chemotherapy agents :  Gemzar/Carboplatin  To help prevent nausea and vomiting after your treatment, we encourage you to take your nausea medicationIf you develop nausea and vomiting that is not controlled by your nausea medication, call the clinic.   BELOW ARE SYMPTOMS THAT SHOULD BE REPORTED IMMEDIATELY:  *FEVER GREATER THAN 100.5 F  *CHILLS WITH OR WITHOUT FEVER  NAUSEA AND VOMITING THAT IS NOT CONTROLLED WITH YOUR NAUSEA MEDICATION  *UNUSUAL SHORTNESS OF BREATH  *UNUSUAL BRUISING OR BLEEDING  TENDERNESS IN MOUTH AND THROAT WITH OR WITHOUT PRESENCE OF ULCERS  *URINARY PROBLEMS  *BOWEL PROBLEMS  UNUSUAL RASH Items with * indicate a potential emergency and should be followed up as soon as possible.  Feel free to call the clinic you have any questions or concerns. The clinic phone number is [phone].  Please show the CHEMO ALERT CARD at check-in to the Emergency Department and triage nurse.   

## 2015-10-02 ENCOUNTER — Other Ambulatory Visit: Payer: Medicare Other

## 2015-10-02 ENCOUNTER — Telehealth: Payer: Self-pay | Admitting: Oncology

## 2015-10-02 ENCOUNTER — Ambulatory Visit (HOSPITAL_BASED_OUTPATIENT_CLINIC_OR_DEPARTMENT_OTHER): Payer: Medicare Other | Admitting: Oncology

## 2015-10-02 ENCOUNTER — Ambulatory Visit: Payer: Medicare Other

## 2015-10-02 ENCOUNTER — Other Ambulatory Visit (HOSPITAL_BASED_OUTPATIENT_CLINIC_OR_DEPARTMENT_OTHER): Payer: Medicare Other

## 2015-10-02 VITALS — BP 104/77 | HR 70 | Temp 98.1°F | Resp 18 | Wt 187.2 lb

## 2015-10-02 DIAGNOSIS — D649 Anemia, unspecified: Secondary | ICD-10-CM

## 2015-10-02 DIAGNOSIS — N3289 Other specified disorders of bladder: Secondary | ICD-10-CM

## 2015-10-02 DIAGNOSIS — D696 Thrombocytopenia, unspecified: Secondary | ICD-10-CM | POA: Diagnosis not present

## 2015-10-02 DIAGNOSIS — C661 Malignant neoplasm of right ureter: Secondary | ICD-10-CM

## 2015-10-02 DIAGNOSIS — D500 Iron deficiency anemia secondary to blood loss (chronic): Secondary | ICD-10-CM

## 2015-10-02 DIAGNOSIS — C651 Malignant neoplasm of right renal pelvis: Secondary | ICD-10-CM

## 2015-10-02 DIAGNOSIS — C659 Malignant neoplasm of unspecified renal pelvis: Secondary | ICD-10-CM

## 2015-10-02 LAB — CBC WITH DIFFERENTIAL/PLATELET
BASO%: 1.3 % (ref 0.0–2.0)
BASOS ABS: 0.1 10*3/uL (ref 0.0–0.1)
EOS ABS: 0.1 10*3/uL (ref 0.0–0.5)
EOS%: 2.1 % (ref 0.0–7.0)
HCT: 30.4 % — ABNORMAL LOW (ref 38.4–49.9)
HEMOGLOBIN: 10.1 g/dL — AB (ref 13.0–17.1)
LYMPH%: 26.5 % (ref 14.0–49.0)
MCH: 33.1 pg (ref 27.2–33.4)
MCHC: 33.3 g/dL (ref 32.0–36.0)
MCV: 99.5 fL — AB (ref 79.3–98.0)
MONO#: 0.2 10*3/uL (ref 0.1–0.9)
MONO%: 3.7 % (ref 0.0–14.0)
NEUT#: 3 10*3/uL (ref 1.5–6.5)
NEUT%: 66.4 % (ref 39.0–75.0)
Platelets: 103 10*3/uL — ABNORMAL LOW (ref 140–400)
RBC: 3.06 10*6/uL — AB (ref 4.20–5.82)
RDW: 15 % — AB (ref 11.0–14.6)
WBC: 4.6 10*3/uL (ref 4.0–10.3)
lymph#: 1.2 10*3/uL (ref 0.9–3.3)

## 2015-10-02 LAB — COMPREHENSIVE METABOLIC PANEL
ALK PHOS: 78 U/L (ref 40–150)
ALT: <9 U/L (ref 0–55)
AST: 17 U/L (ref 5–34)
Albumin: 3.5 g/dL (ref 3.5–5.0)
Anion Gap: 8 mEq/L (ref 3–11)
BUN: 21.1 mg/dL (ref 7.0–26.0)
CO2: 26 meq/L (ref 22–29)
Calcium: 9.4 mg/dL (ref 8.4–10.4)
Chloride: 104 mEq/L (ref 98–109)
Creatinine: 1.1 mg/dL (ref 0.7–1.3)
EGFR: 65 mL/min/{1.73_m2} — AB (ref >90)
GLUCOSE: 131 mg/dL (ref 70–140)
POTASSIUM: 5.1 meq/L (ref 3.5–5.1)
SODIUM: 138 meq/L (ref 136–145)
Total Bilirubin: 0.87 mg/dL (ref 0.20–1.20)
Total Protein: 7.3 g/dL (ref 6.4–8.3)

## 2015-10-02 NOTE — Telephone Encounter (Signed)
Gave pt appt for April & may & avs

## 2015-10-02 NOTE — Progress Notes (Signed)
Hematology and Oncology Follow Up Visit  [name] [id number] Mar 09, 1930 82 y.o. 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 CT scan in January 2017 showed pelvic adenopathy indicating metastatic disease.   Prior Therapy: He is status post cystoscopy and right double-J stent placement in August 2016.  Current therapy:  Palliative chemotherapy utilizing carboplatin and gemcitabine cycle one given on 08/14/2015.  Cycle 2 given on 09/25/2015 because of hospitalization and neutropenia. This was given at a reduced doses and elevation of date.  Interim History:  [name] presents today for a follow-up visit. Since the last visit, he received day 1 of cycle 2 on 09/25/2015 without any complications. He denied any nausea or infusion related issues. He denied any hematuria or bruising. His mobility is improving slowly as he is getting physical therapy at home. He is walking with the help of a walker and his stability and strength is reasonably improved. His appetite is excellent and has not reported any weight loss.  He does not report any headaches, blurry vision, syncope or seizures. He does not report any fevers, chills or sweats. Does not report any appetite changes or weight loss. Does not report any chest pain, palpitation, orthopnea but does report leg edema. He does not report any cough, wheezing or hemoptysis. Does not report any nausea, vomiting, constipation or diarrhea. He does not report any skeletal complaints of arthralgias or myalgias. Does not report any lymphadenopathy or petechiae. Does not report any mood issues. Remaining review of systems unremarkable.   Medications: I have reviewed the patient's current medications.  Current Outpatient Prescriptions  Medication Sig Dispense Refill  . acetaminophen (TYLENOL) 325 MG  tablet Take 1-2 tablets (325-650 mg total) by mouth every 4 (four) hours as needed for mild pain.    Marland Kitchen clopidogrel (PLAVIX) 75 MG tablet Take 75 mg by mouth daily. For Afib/TIA    . docusate sodium (COLACE) 100 MG capsule Take 1 capsule (100 mg total) by mouth 2 (two) times daily.    . ferrous sulfate 325 (65 FE) MG tablet Take 325 mg by mouth daily with breakfast. For anemia.    Marland Kitchen mineral oil-hydrophilic petrolatum (AQUAPHOR) ointment Apply topically 2 (two) times daily. 420 g 0  . oxybutynin (DITROPAN) 5 MG tablet Take 1 tablet (5 mg total) by mouth every 8 (eight) hours as needed for bladder spasms.    . pantoprazole (PROTONIX) 40 MG tablet Take 40 mg by mouth daily. For GERD.    Marland Kitchen polyethylene glycol (MIRALAX / GLYCOLAX) packet Take 17 g by mouth daily.    . prochlorperazine (COMPAZINE) 10 MG tablet Take 10 mg by mouth every 6 (six) hours as needed for nausea or vomiting.    Marland Kitchen UNABLE TO FIND Med Name: Med pass 120 mL 3 times daily between meals as a supplement     No current facility-administered medications for this visit.     Allergies:  Allergies  Allergen Reactions  . Codeine Other (See Comments)    Blisters between fingers  . Docetaxel Rash    Past Medical History, Surgical history, Social history, and Family History were reviewed and updated.   Physical Exam: Blood pressure 104/77, pulse 70, temperature 98.1 F (36.7 C), temperature source Oral, resp. rate 18, weight 187 lb 3.2 oz (84.913 kg), SpO2 100 %. ECOG: 1 General appearance: Alert, awake gentleman without distress. Head:  Normocephalic, without obvious abnormality no oral thrush noted. Neck: no adenopathy Lymph nodes: Cervical, supraclavicular, and axillary nodes normal. Heart:regular rate and rhythm, S1, S2 normal, no murmur, click, rub or gallop Lung:chest clear, no wheezing, rales, normal symmetric air entry Abdomin: soft, non-tender, without masses or organomegaly no shifting dullness or ascites. EXT:no edema  noted. Skin: No petechiae or ecchymosis noted.   Lab Results: Lab Results  Component Value Date   WBC 4.6 10/02/2015   HGB 10.1* 10/02/2015   HCT 30.4* 10/02/2015   MCV 99.5* 10/02/2015   PLT 103* 10/02/2015     Chemistry      Component Value Date/Time   NA 139 09/25/2015 0957   NA 138 09/09/2015   NA 135 09/01/2015 0601   K 4.8 09/25/2015 0957   K 4.0 09/09/2015   CL 102 09/01/2015 0601   CO2 25 09/25/2015 0957   CO2 25 09/01/2015 0601   BUN 16.1 09/25/2015 0957   BUN 12 09/09/2015   BUN 13 09/01/2015 0601   CREATININE 1.1 09/25/2015 0957   CREATININE 1.0 09/09/2015   CREATININE 0.93 09/01/2015 0601   CREATININE 0.92 08/29/2013 1519   GLU 98 09/09/2015      Component Value Date/Time   CALCIUM 9.5 09/25/2015 0957   CALCIUM 8.3* 09/01/2015 0601   ALKPHOS 85 09/25/2015 0957   ALKPHOS 101 09/09/2015   AST 15 09/25/2015 0957   AST 19 09/09/2015   ALT <9 09/25/2015 0957   ALT 9* 09/09/2015   BILITOT 0.91 09/25/2015 0957   BILITOT 1.5* 08/27/2015 1900       Impression and Plan:   82 year old gentleman with the following issues:  1. 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 CT scan in January 2017 showed pelvic adenopathy indicating metastatic disease.  He is currently receiving salvage chemotherapy with carboplatin and gemcitabine. He Is status post cycle 2 with reduced doses of carboplatin and gemcitabine and with elimination of day 8. He tolerated the restart of chemotherapy without any major issues.  The plan is to proceed with cycle 3 of chemotherapy on 10/16/2015 where he will receive Gemzar and carboplatin. There will be no day 8 moving forward and any subsequent cycles.  2. IV access: Port-A-Cath inserted without any complications. EMLA creams available to the patient.  3. Nausea prophylaxis: Compazine is available to the patient. No nausea or vomiting noted after  the last cycle.  4. Thrombocytopenia: His platelet count appears adequate after the last cycle of chemotherapy. However, he cannot tolerate day 8 of chemotherapy as further thrombocyte beauty can be noted.  5. Bladder spasms: Ditropan seemed to have helped his symptoms.  6. Anemia: Multifactorial in nature related to blood loss as well as chemotherapy-induced anemia. His hemoglobin is improving at this time.  7. febrile neutropenia: His white cell count appears adequate at this time. No growth factor support will be needed.  8. Follow-up: Will be in 2 weeks to start cycle 3 of chemotherapy.  [facility], MD 4/12/201710:26 AM

## 2015-10-14 ENCOUNTER — Encounter: Payer: Self-pay | Admitting: *Deleted

## 2015-10-16 ENCOUNTER — Ambulatory Visit (HOSPITAL_BASED_OUTPATIENT_CLINIC_OR_DEPARTMENT_OTHER): Payer: Medicare Other

## 2015-10-16 ENCOUNTER — Ambulatory Visit (HOSPITAL_BASED_OUTPATIENT_CLINIC_OR_DEPARTMENT_OTHER): Payer: Medicare Other | Admitting: Oncology

## 2015-10-16 ENCOUNTER — Telehealth: Payer: Self-pay | Admitting: Oncology

## 2015-10-16 ENCOUNTER — Other Ambulatory Visit (HOSPITAL_BASED_OUTPATIENT_CLINIC_OR_DEPARTMENT_OTHER): Payer: Medicare Other

## 2015-10-16 VITALS — BP 151/69 | HR 69 | Temp 98.4°F | Resp 18 | Ht 69.0 in | Wt 191.7 lb

## 2015-10-16 DIAGNOSIS — C651 Malignant neoplasm of right renal pelvis: Secondary | ICD-10-CM

## 2015-10-16 DIAGNOSIS — Z5111 Encounter for antineoplastic chemotherapy: Secondary | ICD-10-CM | POA: Diagnosis present

## 2015-10-16 DIAGNOSIS — C661 Malignant neoplasm of right ureter: Secondary | ICD-10-CM

## 2015-10-16 DIAGNOSIS — R591 Generalized enlarged lymph nodes: Secondary | ICD-10-CM

## 2015-10-16 LAB — COMPREHENSIVE METABOLIC PANEL
ALBUMIN: 3.6 g/dL (ref 3.5–5.0)
ALK PHOS: 86 U/L (ref 40–150)
ALT: <9 U/L (ref 0–55)
ANION GAP: 9 meq/L (ref 3–11)
AST: 18 U/L (ref 5–34)
BILIRUBIN TOTAL: 1.19 mg/dL (ref 0.20–1.20)
BUN: 13.4 mg/dL (ref 7.0–26.0)
CALCIUM: 8.9 mg/dL (ref 8.4–10.4)
CO2: 24 mEq/L (ref 22–29)
Chloride: 107 mEq/L (ref 98–109)
Creatinine: 1.2 mg/dL (ref 0.7–1.3)
EGFR: 55 mL/min/{1.73_m2} — AB (ref >90)
Glucose: 128 mg/dl (ref 70–140)
Potassium: 4.2 mEq/L (ref 3.5–5.1)
Sodium: 140 mEq/L (ref 136–145)
TOTAL PROTEIN: 7 g/dL (ref 6.4–8.3)

## 2015-10-16 LAB — CBC WITH DIFFERENTIAL/PLATELET
BASO%: 0.5 % (ref 0.0–2.0)
Basophils Absolute: 0 10*3/uL (ref 0.0–0.1)
EOS ABS: 0.1 10*3/uL (ref 0.0–0.5)
EOS%: 1.3 % (ref 0.0–7.0)
HCT: 32 % — ABNORMAL LOW (ref 38.4–49.9)
HEMOGLOBIN: 10.8 g/dL — AB (ref 13.0–17.1)
LYMPH%: 32.6 % (ref 14.0–49.0)
MCH: 34.8 pg — ABNORMAL HIGH (ref 27.2–33.4)
MCHC: 33.8 g/dL (ref 32.0–36.0)
MCV: 103.2 fL — AB (ref 79.3–98.0)
MONO#: 0.6 10*3/uL (ref 0.1–0.9)
MONO%: 14.8 % — ABNORMAL HIGH (ref 0.0–14.0)
NEUT%: 50.8 % (ref 39.0–75.0)
NEUTROS ABS: 2 10*3/uL (ref 1.5–6.5)
PLATELETS: 155 10*3/uL (ref 140–400)
RBC: 3.1 10*6/uL — ABNORMAL LOW (ref 4.20–5.82)
RDW: 19.3 % — AB (ref 11.0–14.6)
WBC: 3.9 10*3/uL — AB (ref 4.0–10.3)
lymph#: 1.3 10*3/uL (ref 0.9–3.3)

## 2015-10-16 MED ORDER — SODIUM CHLORIDE 0.9 % IV SOLN
260.0000 mg | Freq: Once | INTRAVENOUS | Status: AC
  Administered 2015-10-16: 260 mg via INTRAVENOUS
  Filled 2015-10-16: qty 26

## 2015-10-16 MED ORDER — SODIUM CHLORIDE 0.9 % IV SOLN
Freq: Once | INTRAVENOUS | Status: AC
  Administered 2015-10-16: 11:00:00 via INTRAVENOUS

## 2015-10-16 MED ORDER — PALONOSETRON HCL INJECTION 0.25 MG/5ML
0.2500 mg | Freq: Once | INTRAVENOUS | Status: AC
  Administered 2015-10-16: 0.25 mg via INTRAVENOUS

## 2015-10-16 MED ORDER — PALONOSETRON HCL INJECTION 0.25 MG/5ML
INTRAVENOUS | Status: AC
  Filled 2015-10-16: qty 5

## 2015-10-16 MED ORDER — SODIUM CHLORIDE 0.9% FLUSH
10.0000 mL | INTRAVENOUS | Status: DC | PRN
  Administered 2015-10-16: 10 mL
  Filled 2015-10-16: qty 10

## 2015-10-16 MED ORDER — SODIUM CHLORIDE 0.9 % IV SOLN
10.0000 mg | Freq: Once | INTRAVENOUS | Status: AC
  Administered 2015-10-16: 10 mg via INTRAVENOUS
  Filled 2015-10-16: qty 1

## 2015-10-16 MED ORDER — CARBOPLATIN CHEMO INTRADERMAL TEST DOSE 100MCG/0.02ML
100.0000 ug | Freq: Once | INTRADERMAL | Status: AC
  Administered 2015-10-16: 100 ug via INTRADERMAL
  Filled 2015-10-16: qty 0.01

## 2015-10-16 MED ORDER — HEPARIN SOD (PORK) LOCK FLUSH 100 UNIT/ML IV SOLN
500.0000 [IU] | Freq: Once | INTRAVENOUS | Status: AC | PRN
  Administered 2015-10-16: 500 [IU]
  Filled 2015-10-16: qty 5

## 2015-10-16 MED ORDER — SODIUM CHLORIDE 0.9 % IV SOLN
800.0000 mg/m2 | Freq: Once | INTRAVENOUS | Status: AC
  Administered 2015-10-16: 1634 mg via INTRAVENOUS
  Filled 2015-10-16: qty 42.98

## 2015-10-16 NOTE — Progress Notes (Signed)
Hematology and Oncology Follow Up Visit  [name] [id number] [birth year] 82 y.o. 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 CT scan in January 2017 showed pelvic adenopathy indicating metastatic disease.   Prior Therapy: He is status post cystoscopy and right double-J stent placement in August 2016.  Current therapy:  Palliative chemotherapy utilizing carboplatin and gemcitabine cycle one given on 08/14/2015.  Cycle 2 given on 09/25/2015 because of hospitalization and neutropenia. This was given at a reduced doses and elevation of date. He is here for evaluation before cycle 3.  Interim History:  [name] presents today for a follow-up visit. Since the last visit, he reports no delayed complications related to therapy. He denied any nausea or infusion related issues. He denied any hematuria or bruising. His mobility is improving slowly as he is getting physical therapy at home. His appetite is excellent and his weight is stable. He denied any recent falls or syncope. His quality of life continues to improve.  He does not report any headaches, blurry vision, syncope or seizures. He does not report any fevers, chills or sweats. Does not report any appetite changes or weight loss. Does not report any chest pain, palpitation, orthopnea but does report leg edema. He does not report any cough, wheezing or hemoptysis. Does not report any nausea, vomiting, constipation or diarrhea. He does not report any skeletal complaints of arthralgias or myalgias. Does not report any lymphadenopathy or petechiae. Does not report any mood issues. Remaining review of systems unremarkable.   Medications: I have reviewed the patient's current medications.  Current Outpatient Prescriptions  Medication Sig Dispense Refill  . acetaminophen (TYLENOL) 325  MG tablet Take 1-2 tablets (325-650 mg total) by mouth every 4 (four) hours as needed for mild pain.    Marland Kitchen clopidogrel (PLAVIX) 75 MG tablet Take 75 mg by mouth daily. For Afib/TIA    . docusate sodium (COLACE) 100 MG capsule Take 1 capsule (100 mg total) by mouth 2 (two) times daily.    . ferrous sulfate 325 (65 FE) MG tablet Take 325 mg by mouth daily with breakfast. For anemia.    Marland Kitchen mineral oil-hydrophilic petrolatum (AQUAPHOR) ointment Apply topically 2 (two) times daily. 420 g 0  . oxybutynin (DITROPAN) 5 MG tablet Take 1 tablet (5 mg total) by mouth every 8 (eight) hours as needed for bladder spasms.    . pantoprazole (PROTONIX) 40 MG tablet Take 40 mg by mouth daily. For GERD.    Marland Kitchen polyethylene glycol (MIRALAX / GLYCOLAX) packet Take 17 g by mouth daily.    . prochlorperazine (COMPAZINE) 10 MG tablet Take 10 mg by mouth every 6 (six) hours as needed for nausea or vomiting.    Marland Kitchen UNABLE TO FIND Med Name: Med pass 120 mL 3 times daily between meals as a supplement     No current facility-administered medications for this visit.     Allergies:  Allergies  Allergen Reactions  . Codeine Other (See Comments)    Blisters between fingers  . Docetaxel Rash    Past Medical History, Surgical history, Social history, and Family History were reviewed and updated.   Physical Exam: Blood pressure 151/69, pulse 69, temperature 98.4 F (36.9 C), temperature source Oral, resp. rate 18, height 5\' 9"  (1.753 m), weight 191 lb 11.2 oz (86.955 kg), SpO2 100 %. ECOG: 1 General appearance: Chronically ill-appearing  gentleman without distress. Head: Normocephalic, without obvious abnormality no oral ulcers or lesions. Neck: no adenopathy Lymph nodes: Cervical, supraclavicular, and axillary nodes normal. Heart:regular rate and rhythm, S1, S2 normal, no murmur, click, rub or gallop Lung:chest clear, no wheezing, rales, normal symmetric air entry Abdomin: soft, non-tender, without masses or organomegaly  no rebound or guarding. EXT:no edema noted. Skin: No petechiae or ecchymosis noted.   Lab Results: Lab Results  Component Value Date   WBC 3.9* 10/16/2015   HGB 10.8* 10/16/2015   HCT 32.0* 10/16/2015   MCV 103.2* 10/16/2015   PLT 155 10/16/2015     Chemistry      Component Value Date/Time   NA 138 10/02/2015 0936   NA 138 09/09/2015   NA 135 09/01/2015 0601   K 5.1 10/02/2015 0936   K 4.0 09/09/2015   CL 102 09/01/2015 0601   CO2 26 10/02/2015 0936   CO2 25 09/01/2015 0601   BUN 21.1 10/02/2015 0936   BUN 12 09/09/2015   BUN 13 09/01/2015 0601   CREATININE 1.1 10/02/2015 0936   CREATININE 1.0 09/09/2015   CREATININE 0.93 09/01/2015 0601   CREATININE 0.92 08/29/2013 1519   GLU 98 09/09/2015      Component Value Date/Time   CALCIUM 9.4 10/02/2015 0936   CALCIUM 8.3* 09/01/2015 0601   ALKPHOS 78 10/02/2015 0936   ALKPHOS 101 09/09/2015   AST 17 10/02/2015 0936   AST 19 09/09/2015   ALT <9 10/02/2015 0936   ALT 9* 09/09/2015   BILITOT 0.87 10/02/2015 0936   BILITOT 1.5* 08/27/2015 1900       Impression and Plan:   82 year old gentleman with the following issues:  1. 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 CT scan in January 2017 showed pelvic adenopathy indicating metastatic disease.  He is currently receiving salvage chemotherapy with carboplatin and gemcitabine. He Is status post cycle 2 with reduced doses of carboplatin and gemcitabine and with elimination of day 8. He tolerated the restart of chemotherapy without any major issues.  The plan is to proceed with cycle 3 of chemotherapy Today and repeat imaging studies before cycle 4 of therapy. He will not receive day 8 moving forward.  2. IV access: Port-A-Cath inserted without any complications. He denied any pain or thrombosis.  3. Nausea prophylaxis: Compazine is available to the patient. No nausea or vomiting  noted after the last cycle.  4. Thrombocytopenia: His platelet count appears adequate to receive his cycle of chemotherapy.  5. Bladder spasms: Ditropan seemed to have helped his symptoms.  6. Anemia: Multifactorial in nature related to blood loss as well as chemotherapy-induced anemia. His hemoglobin is stable and does not require transfusion.  7. febrile neutropenia: His white cell count appears adequate at this time. No growth factor support will be needed.  8. Follow-up: Will be in 3 weeks to start cycle 4 of chemotherapy.  [facility], MD 4/26/201710:47 AM

## 2015-10-16 NOTE — Telephone Encounter (Signed)
Gave barium and avs

## 2015-10-16 NOTE — Patient Instructions (Signed)
[place] Discharge Instructions for Patients Receiving Chemotherapy  Today you received the following chemotherapy agents Carboplatin and Gemzar.  To help prevent nausea and vomiting after your treatment, we encourage you to take your nausea medication as directed BUT NO ZOFRAN FOR 3 DAYS   If you develop nausea and vomiting that is not controlled by your nausea medication, call the clinic.   BELOW ARE SYMPTOMS THAT SHOULD BE REPORTED IMMEDIATELY:  *FEVER GREATER THAN 100.5 F  *CHILLS WITH OR WITHOUT FEVER  NAUSEA AND VOMITING THAT IS NOT CONTROLLED WITH YOUR NAUSEA MEDICATION  *UNUSUAL SHORTNESS OF BREATH  *UNUSUAL BRUISING OR BLEEDING  TENDERNESS IN MOUTH AND THROAT WITH OR WITHOUT PRESENCE OF ULCERS  *URINARY PROBLEMS  *BOWEL PROBLEMS  UNUSUAL RASH Items with * indicate a potential emergency and should be followed up as soon as possible.  Feel free to call the clinic you have any questions or concerns. The clinic phone number is [phone].  Please show the [place] at check-in to the Emergency Department and triage nurse.

## 2015-11-05 ENCOUNTER — Encounter (HOSPITAL_COMMUNITY): Payer: Self-pay

## 2015-11-05 ENCOUNTER — Ambulatory Visit (HOSPITAL_COMMUNITY)
Admission: RE | Admit: 2015-11-05 | Discharge: 2015-11-05 | Disposition: A | Discharge status: Home / Self Care | Payer: Medicare Other | Source: Ambulatory Visit | Attending: Oncology | Admitting: Oncology

## 2015-11-05 DIAGNOSIS — K8020 Calculus of gallbladder without cholecystitis without obstruction: Secondary | ICD-10-CM | POA: Insufficient documentation

## 2015-11-05 DIAGNOSIS — R93421 Abnormal radiologic findings on diagnostic imaging of right kidney: Secondary | ICD-10-CM | POA: Insufficient documentation

## 2015-11-05 DIAGNOSIS — C659 Malignant neoplasm of unspecified renal pelvis: Secondary | ICD-10-CM | POA: Diagnosis present

## 2015-11-05 DIAGNOSIS — I289 Disease of pulmonary vessels, unspecified: Secondary | ICD-10-CM | POA: Insufficient documentation

## 2015-11-05 DIAGNOSIS — K4020 Bilateral inguinal hernia, without obstruction or gangrene, not specified as recurrent: Secondary | ICD-10-CM | POA: Diagnosis not present

## 2015-11-05 DIAGNOSIS — I700 Atherosclerosis of aorta: Secondary | ICD-10-CM | POA: Insufficient documentation

## 2015-11-05 DIAGNOSIS — R591 Generalized enlarged lymph nodes: Secondary | ICD-10-CM | POA: Diagnosis present

## 2015-11-05 DIAGNOSIS — I7789 Other specified disorders of arteries and arterioles: Secondary | ICD-10-CM | POA: Diagnosis not present

## 2015-11-05 DIAGNOSIS — R590 Localized enlarged lymph nodes: Secondary | ICD-10-CM | POA: Diagnosis not present

## 2015-11-05 DIAGNOSIS — I2510 Atherosclerotic heart disease of native coronary artery without angina pectoris: Secondary | ICD-10-CM | POA: Diagnosis not present

## 2015-11-05 MED ORDER — IOPAMIDOL (ISOVUE-300) INJECTION 61%
100.0000 mL | Freq: Once | INTRAVENOUS | Status: AC | PRN
  Administered 2015-11-05: 100 mL via INTRAVENOUS

## 2015-11-06 ENCOUNTER — Ambulatory Visit (HOSPITAL_BASED_OUTPATIENT_CLINIC_OR_DEPARTMENT_OTHER): Payer: Medicare Other | Admitting: Oncology

## 2015-11-06 ENCOUNTER — Telehealth: Payer: Self-pay | Admitting: Oncology

## 2015-11-06 ENCOUNTER — Other Ambulatory Visit (HOSPITAL_BASED_OUTPATIENT_CLINIC_OR_DEPARTMENT_OTHER): Payer: Medicare Other

## 2015-11-06 ENCOUNTER — Ambulatory Visit (HOSPITAL_BASED_OUTPATIENT_CLINIC_OR_DEPARTMENT_OTHER): Payer: Medicare Other

## 2015-11-06 ENCOUNTER — Encounter: Payer: Self-pay | Admitting: *Deleted

## 2015-11-06 VITALS — BP 149/84 | HR 74 | Temp 98.0°F | Resp 18 | Ht 69.0 in | Wt 188.2 lb

## 2015-11-06 DIAGNOSIS — D500 Iron deficiency anemia secondary to blood loss (chronic): Secondary | ICD-10-CM

## 2015-11-06 DIAGNOSIS — Z5111 Encounter for antineoplastic chemotherapy: Secondary | ICD-10-CM

## 2015-11-06 DIAGNOSIS — D6481 Anemia due to antineoplastic chemotherapy: Secondary | ICD-10-CM | POA: Diagnosis not present

## 2015-11-06 DIAGNOSIS — C661 Malignant neoplasm of right ureter: Secondary | ICD-10-CM

## 2015-11-06 DIAGNOSIS — C651 Malignant neoplasm of right renal pelvis: Secondary | ICD-10-CM

## 2015-11-06 DIAGNOSIS — C659 Malignant neoplasm of unspecified renal pelvis: Secondary | ICD-10-CM

## 2015-11-06 LAB — COMPREHENSIVE METABOLIC PANEL
ALBUMIN: 3.8 g/dL (ref 3.5–5.0)
ALK PHOS: 82 U/L (ref 40–150)
ALT: 9 U/L (ref 0–55)
ANION GAP: 8 meq/L (ref 3–11)
AST: 19 U/L (ref 5–34)
BILIRUBIN TOTAL: 1.29 mg/dL — AB (ref 0.20–1.20)
BUN: 10.7 mg/dL (ref 7.0–26.0)
CALCIUM: 9.1 mg/dL (ref 8.4–10.4)
CO2: 25 meq/L (ref 22–29)
CREATININE: 1.1 mg/dL (ref 0.7–1.3)
Chloride: 104 mEq/L (ref 98–109)
EGFR: 62 mL/min/{1.73_m2} — ABNORMAL LOW (ref >90)
Glucose: 98 mg/dl (ref 70–140)
Potassium: 4.3 mEq/L (ref 3.5–5.1)
Sodium: 137 mEq/L (ref 136–145)
TOTAL PROTEIN: 7.5 g/dL (ref 6.4–8.3)

## 2015-11-06 LAB — CBC WITH DIFFERENTIAL/PLATELET
BASO%: 1 % (ref 0.0–2.0)
Basophils Absolute: 0 10*3/uL (ref 0.0–0.1)
EOS ABS: 0 10*3/uL (ref 0.0–0.5)
EOS%: 1.2 % (ref 0.0–7.0)
HEMATOCRIT: 32.7 % — AB (ref 38.4–49.9)
HEMOGLOBIN: 10.9 g/dL — AB (ref 13.0–17.1)
LYMPH#: 1.1 10*3/uL (ref 0.9–3.3)
LYMPH%: 38.5 % (ref 14.0–49.0)
MCH: 36.2 pg — ABNORMAL HIGH (ref 27.2–33.4)
MCHC: 33.4 g/dL (ref 32.0–36.0)
MCV: 108.2 fL — ABNORMAL HIGH (ref 79.3–98.0)
MONO#: 0.6 10*3/uL (ref 0.1–0.9)
MONO%: 21 % — ABNORMAL HIGH (ref 0.0–14.0)
NEUT%: 38.3 % — ABNORMAL LOW (ref 39.0–75.0)
NEUTROS ABS: 1.1 10*3/uL — AB (ref 1.5–6.5)
PLATELETS: 191 10*3/uL (ref 140–400)
RBC: 3.02 10*6/uL — AB (ref 4.20–5.82)
RDW: 22.9 % — AB (ref 11.0–14.6)
WBC: 2.9 10*3/uL — AB (ref 4.0–10.3)

## 2015-11-06 MED ORDER — CARBOPLATIN CHEMO INTRADERMAL TEST DOSE 100MCG/0.02ML
100.0000 ug | Freq: Once | INTRADERMAL | Status: AC
  Administered 2015-11-06: 100 ug via INTRADERMAL
  Filled 2015-11-06: qty 0.01

## 2015-11-06 MED ORDER — DEXAMETHASONE SODIUM PHOSPHATE 100 MG/10ML IJ SOLN
10.0000 mg | Freq: Once | INTRAMUSCULAR | Status: AC
  Administered 2015-11-06: 10 mg via INTRAVENOUS
  Filled 2015-11-06: qty 1

## 2015-11-06 MED ORDER — PALONOSETRON HCL INJECTION 0.25 MG/5ML
0.2500 mg | Freq: Once | INTRAVENOUS | Status: AC
  Administered 2015-11-06: 0.25 mg via INTRAVENOUS

## 2015-11-06 MED ORDER — SODIUM CHLORIDE 0.9 % IV SOLN
800.0000 mg/m2 | Freq: Once | INTRAVENOUS | Status: AC
  Administered 2015-11-06: 1634 mg via INTRAVENOUS
  Filled 2015-11-06: qty 42.98

## 2015-11-06 MED ORDER — HEPARIN SOD (PORK) LOCK FLUSH 100 UNIT/ML IV SOLN
500.0000 [IU] | Freq: Once | INTRAVENOUS | Status: AC | PRN
  Administered 2015-11-06: 500 [IU]
  Filled 2015-11-06: qty 5

## 2015-11-06 MED ORDER — PALONOSETRON HCL INJECTION 0.25 MG/5ML
INTRAVENOUS | Status: AC
  Filled 2015-11-06: qty 5

## 2015-11-06 MED ORDER — SODIUM CHLORIDE 0.9% FLUSH
10.0000 mL | INTRAVENOUS | Status: DC | PRN
  Administered 2015-11-06: 10 mL
  Filled 2015-11-06: qty 10

## 2015-11-06 MED ORDER — CARBOPLATIN CHEMO INJECTION 450 MG/45ML
260.0000 mg | Freq: Once | INTRAVENOUS | Status: AC
  Administered 2015-11-06: 260 mg via INTRAVENOUS
  Filled 2015-11-06: qty 26

## 2015-11-06 MED ORDER — SODIUM CHLORIDE 0.9 % IV SOLN
Freq: Once | INTRAVENOUS | Status: AC
  Administered 2015-11-06: 13:00:00 via INTRAVENOUS

## 2015-11-06 NOTE — Progress Notes (Signed)
Per dr shadad, okay to treat today despite labs. 

## 2015-11-06 NOTE — Patient Instructions (Signed)
[place] Discharge Instructions for Patients Receiving Chemotherapy  Today you received the following chemotherapy agents Carboplatin and Gemzar.  To help prevent nausea and vomiting after your treatment, we encourage you to take your nausea medication as directed BUT NO ZOFRAN FOR 3 DAYS   If you develop nausea and vomiting that is not controlled by your nausea medication, call the clinic.   BELOW ARE SYMPTOMS THAT SHOULD BE REPORTED IMMEDIATELY:  *FEVER GREATER THAN 100.5 F  *CHILLS WITH OR WITHOUT FEVER  NAUSEA AND VOMITING THAT IS NOT CONTROLLED WITH YOUR NAUSEA MEDICATION  *UNUSUAL SHORTNESS OF BREATH  *UNUSUAL BRUISING OR BLEEDING  TENDERNESS IN MOUTH AND THROAT WITH OR WITHOUT PRESENCE OF ULCERS  *URINARY PROBLEMS  *BOWEL PROBLEMS  UNUSUAL RASH Items with * indicate a potential emergency and should be followed up as soon as possible.  Feel free to call the clinic you have any questions or concerns. The clinic phone number is [phone].  Please show the [place] at check-in to the Emergency Department and triage nurse.

## 2015-11-06 NOTE — Telephone Encounter (Signed)
Gave pt apt & avs °

## 2015-11-06 NOTE — Progress Notes (Signed)
Hematology and Oncology Follow Up Visit  SHAVEZ MCGARTY [id number] [birth year] 82 y.o. 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 CT scan in January 2017 showed pelvic adenopathy indicating metastatic disease.   Prior Therapy: He is status post cystoscopy and right double-J stent placement in August 2016.  Current therapy:  Palliative chemotherapy utilizing carboplatin and gemcitabine cycle one given on 08/14/2015. Cycle 2 given on 09/25/2015 because of hospitalization and neutropenia.  He is here for evaluation before cycle 4 she has been receiving every 3 weeks with dose reduction.  Interim History:  Mr. [name] presents today for a follow-up visit. Since the last visit, he reports tolerated chemotherapy without any major complications. He denied any nausea or infusion related issues. He denied any hematuria or bruising. His appetite is excellent and his weight is stable. He denied any recent falls or syncope. His quality of life continues to improve. His mobility is improving although he continues to have increase in his lower extremity edema. He denied any shortness of breath or any fluid retention. He continues to show excellent quality of life without any deterioration. He reports his bladder spasms much improved and does not take any medication for it at this time. He is no longer reporting any nausea as well.  He does not report any headaches, blurry vision, syncope or seizures. He does not report any fevers, chills or sweats. Does not report any appetite changes or weight loss. Does not report any chest pain, palpitation, orthopnea but does report leg edema. He does not report any cough, wheezing or hemoptysis. Does not report any nausea, vomiting, constipation or diarrhea. He does not report any skeletal complaints of arthralgias or  myalgias. Does not report any lymphadenopathy or petechiae. Does not report any mood issues. Remaining review of systems unremarkable.   Medications: I have reviewed the patient's current medications.  Current Outpatient Prescriptions  Medication Sig Dispense Refill  . acetaminophen (TYLENOL) 325 MG tablet Take 1-2 tablets (325-650 mg total) by mouth every 4 (four) hours as needed for mild pain.    Marland Kitchen clopidogrel (PLAVIX) 75 MG tablet Take 75 mg by mouth daily. For Afib/TIA    . docusate sodium (COLACE) 100 MG capsule Take 1 capsule (100 mg total) by mouth 2 (two) times daily.    . ferrous sulfate 325 (65 FE) MG tablet Take 325 mg by mouth daily with breakfast. For anemia.    Marland Kitchen mineral oil-hydrophilic petrolatum (AQUAPHOR) ointment Apply topically 2 (two) times daily. 420 g 0  . oxybutynin (DITROPAN) 5 MG tablet Take 1 tablet (5 mg total) by mouth every 8 (eight) hours as needed for bladder spasms.    . pantoprazole (PROTONIX) 40 MG tablet Take 40 mg by mouth daily. For GERD.    Marland Kitchen polyethylene glycol (MIRALAX / GLYCOLAX) packet Take 17 g by mouth daily.    . prochlorperazine (COMPAZINE) 10 MG tablet Take 10 mg by mouth every 6 (six) hours as needed for nausea or vomiting.    Marland Kitchen UNABLE TO FIND Med Name: Med pass 120 mL 3 times daily between meals as a supplement     No current facility-administered medications for this visit.     Allergies:  Allergies  Allergen Reactions  . Codeine Other (See Comments)    Blisters between fingers  . Docetaxel Rash    Past Medical History, Surgical history, Social  history, and Family History were reviewed and updated.   Physical Exam: Blood pressure 149/84, pulse 74, temperature 98 F (36.7 C), temperature source Oral, resp. rate 18, height 5\' 9"  (1.753 m), weight 188 lb 3.2 oz (85.367 kg), SpO2 98 %. ECOG: 1 General appearance: Alert, awake gentleman without distress. Head: Normocephalic, without obvious abnormality no oral thrush. Neck: no  adenopathy Lymph nodes: Cervical, supraclavicular, and axillary nodes normal. Heart:regular rate and rhythm, S1, S2 normal, no murmur, click, rub or gallop Lung:chest clear, no wheezing, rales, normal symmetric air entry Abdomin: soft, non-tender, without masses or organomegaly no shifting dullness or ascites. EXT:no edema noted. Skin: No petechiae or ecchymosis noted.   Lab Results: Lab Results  Component Value Date   WBC 2.9* 11/06/2015   HGB 10.9* 11/06/2015   HCT 32.7* 11/06/2015   MCV 108.2* 11/06/2015   PLT 191 11/06/2015     Chemistry      Component Value Date/Time   NA 140 10/16/2015 1007   NA 138 09/09/2015   NA 135 09/01/2015 0601   K 4.2 10/16/2015 1007   K 4.0 09/09/2015   CL 102 09/01/2015 0601   CO2 24 10/16/2015 1007   CO2 25 09/01/2015 0601   BUN 13.4 10/16/2015 1007   BUN 12 09/09/2015   BUN 13 09/01/2015 0601   CREATININE 1.2 10/16/2015 1007   CREATININE 1.0 09/09/2015   CREATININE 0.93 09/01/2015 0601   CREATININE 0.92 08/29/2013 1519   GLU 98 09/09/2015      Component Value Date/Time   CALCIUM 8.9 10/16/2015 1007   CALCIUM 8.3* 09/01/2015 0601   ALKPHOS 86 10/16/2015 1007   ALKPHOS 101 09/09/2015   AST 18 10/16/2015 1007   AST 19 09/09/2015   ALT <9 10/16/2015 1007   ALT 9* 09/09/2015   BILITOT 1.19 10/16/2015 1007   BILITOT 1.5* 08/27/2015 1900     EXAM: CT CHEST, ABDOMEN, AND PELVIS WITH CONTRAST  TECHNIQUE: Multidetector CT imaging of the chest, abdomen and pelvis was performed following the standard protocol during bolus administration of intravenous contrast.  CONTRAST: 119mL ISOVUE-300 IOPAMIDOL (ISOVUE-300) INJECTION 61%  COMPARISON: CT abdomen pelvis 07/08/2015.  FINDINGS: CT CHEST FINDINGS  Mediastinum/Lymph Nodes: Right IJ Port-A-Cath terminates in the right atrium. No pathologically enlarged mediastinal, hilar or axillary lymph nodes. Pulmonary arteries and heart are enlarged. Aortic valvular and coronary  artery calcification. No pericardial effusion.  Lungs/Pleura: Mild nodular scarring in the posterior right lower lobe. Scattered calcified granulomas. No pleural fluid. Airway is unremarkable.  Musculoskeletal: No worrisome lytic or sclerotic lesions. Degenerative changes are seen in the spine.  CT ABDOMEN PELVIS FINDINGS  Hepatobiliary: Liver is unremarkable. Stones and sludge in the gallbladder. No biliary ductal dilatation.  Pancreas: Negative.  Spleen: Negative.  Adrenals/Urinary Tract: There is a nondependent filling defect within an upper pole right renal calyx (series 6, images 25 and 26. Ill-defined soft tissue thickening is seen about the right renal pelvis. Renal cortical scarring bilaterally. 11 mm low-attenuation lesion in the posterior interpolar left kidney, stable and too small to characterize. Bladder wall may be minimally thickened. There may be a TURP defect.  Stomach/Bowel: Stomach, small bowel, appendix and colon are unremarkable.  Vascular/Lymphatic: Atherosclerotic calcification of the arterial vasculature without abdominal aortic aneurysm. Previously seen retroperitoneal adenopathy has improved in the interval. Abdominal retroperitoneal lymph nodes are sub cm in size. Index lymph node along the proximal left common iliac artery measures 10 mm (series 2, image 129), previously 1.3 cm. Index right common iliac artery  lymph node measures 8 mm (image 136), previously 13 mm.  Reproductive: Prostate is normal in size.  Other: Bilateral inguinal hernias contain fat, right larger than left. Tail of the appendix is seen within the right inguinal hernia. No free fluid.  Musculoskeletal: No worrisome lytic or sclerotic lesions. Old right L5 transverse process fracture.  IMPRESSION: 1. Improved abdominal and pelvic retroperitoneal adenopathy. Largest lymph node measures 10 mm in the right common iliac station. 2. Filling defect in an upper  pole right renal calyx with thickening along the renal pelvis. Findings may be related to previous indwelling ureteral stent. Difficult to exclude malignancy. 3. Enlarged pulmonary arteries, indicative of pulmonary arterial hypertension. 4. Coronary artery calcification. 5. Cholelithiasis. 6. Bilateral inguinal hernias.    Impression and Plan:   82 year old gentleman with the following issues:  1. 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 CT scan in January 2017 showed pelvic adenopathy indicating metastatic disease.  He is currently receiving salvage chemotherapy with carboplatin and gemcitabine. He Is status post cycle 2 with reduced doses of carboplatin and gemcitabine and with elimination of day 8. He tolerated the restart of chemotherapy without any major issues.  CT scan on 11/05/2015 was reviewed today and showed excellent response to therapy with decrease in his lymphadenopathy. There is no new metastatic disease noted.  The plan is to proceed with cycle 4 of chemotherapy for a total of 6 cycles. Upon completing the total number of cycles, he will have repeat imaging studies at that time.  2. IV access: Port-A-Cath inserted without any complications. No recent complications noted.  3. Nausea prophylaxis: Compazine is available to the patient. He has not needed any nausea medication at this time.  4. Thrombocytopenia: His platelet count appears adequate to receive his cycle of chemotherapy.  5. Bladder spasms: This have resolved since the restart of chemotherapy indicating likely this is tumor related.  6. Anemia: Multifactorial in nature related to blood loss as well as chemotherapy-induced anemia. His hemoglobin is stable and does not require transfusion.  7. Neutropenia prophylaxis: He will require Neulasta growth factor support after each cycle. Complications associated with this  injection was reviewed dating arthralgias and myalgias. He will receive his first last on 11/08/2015.  8. Follow-up: Will be in 3 weeks to start cycle 5 of chemotherapy.  Zola Button, MD 5/17/201712:11 PM

## 2015-11-08 ENCOUNTER — Ambulatory Visit (HOSPITAL_BASED_OUTPATIENT_CLINIC_OR_DEPARTMENT_OTHER): Payer: Medicare Other

## 2015-11-08 VITALS — BP 155/80 | HR 59 | Temp 98.6°F | Resp 16

## 2015-11-08 DIAGNOSIS — Z5189 Encounter for other specified aftercare: Secondary | ICD-10-CM | POA: Diagnosis not present

## 2015-11-08 DIAGNOSIS — C661 Malignant neoplasm of right ureter: Secondary | ICD-10-CM | POA: Diagnosis present

## 2015-11-08 DIAGNOSIS — C651 Malignant neoplasm of right renal pelvis: Secondary | ICD-10-CM

## 2015-11-08 MED ORDER — PEGFILGRASTIM INJECTION 6 MG/0.6ML ~~LOC~~
6.0000 mg | PREFILLED_SYRINGE | Freq: Once | SUBCUTANEOUS | Status: AC
  Administered 2015-11-08: 6 mg via SUBCUTANEOUS
  Filled 2015-11-08: qty 0.6

## 2015-11-08 NOTE — Patient Instructions (Signed)
Pegfilgrastim injection What is this medicine? PEGFILGRASTIM (PEG fil gra stim) is a long-acting granulocyte colony-stimulating factor that stimulates the growth of neutrophils, a type of white blood cell important in the body's fight against infection. It is used to reduce the incidence of fever and infection in patients with certain types of cancer who are receiving chemotherapy that affects the bone marrow, and to increase survival after being exposed to high doses of radiation. This medicine may be used for other purposes; ask your health care provider or pharmacist if you have questions. What should I tell my health care provider before I take this medicine? They need to know if you have any of these conditions: -kidney disease -latex allergy -ongoing radiation therapy -sickle cell disease -skin reactions to acrylic adhesives (On-Body Injector only) -an unusual or allergic reaction to pegfilgrastim, filgrastim, other medicines, foods, dyes, or preservatives -pregnant or trying to get pregnant -breast-feeding How should I use this medicine? This medicine is for injection under the skin. If you get this medicine at home, you will be taught how to prepare and give the pre-filled syringe or how to use the On-body Injector. Refer to the patient Instructions for Use for detailed instructions. Use exactly as directed. Take your medicine at regular intervals. Do not take your medicine more often than directed. It is important that you put your used needles and syringes in a special sharps container. Do not put them in a trash can. If you do not have a sharps container, call your pharmacist or healthcare provider to get one. Talk to your pediatrician regarding the use of this medicine in children. While this drug may be prescribed for selected conditions, precautions do apply. Overdosage: If you think you have taken too much of this medicine contact a poison control center or emergency room at  once. NOTE: This medicine is only for you. Do not share this medicine with others. What if I miss a dose? It is important not to miss your dose. Call your doctor or health care professional if you miss your dose. If you miss a dose due to an On-body Injector failure or leakage, a new dose should be administered as soon as possible using a single prefilled syringe for manual use. What may interact with this medicine? Interactions have not been studied. Give your health care provider a list of all the medicines, herbs, non-prescription drugs, or dietary supplements you use. Also tell them if you smoke, drink alcohol, or use illegal drugs. Some items may interact with your medicine. This list may not describe all possible interactions. Give your health care provider a list of all the medicines, herbs, non-prescription drugs, or dietary supplements you use. Also tell them if you smoke, drink alcohol, or use illegal drugs. Some items may interact with your medicine. What should I watch for while using this medicine? You may need blood work done while you are taking this medicine. If you are going to need a MRI, CT scan, or other procedure, tell your doctor that you are using this medicine (On-Body Injector only). What side effects may I notice from receiving this medicine? Side effects that you should report to your doctor or health care professional as soon as possible: -allergic reactions like skin rash, itching or hives, swelling of the face, lips, or tongue -dizziness -fever -pain, redness, or irritation at site where injected -pinpoint red spots on the skin -red or dark-brown urine -shortness of breath or breathing problems -stomach or side pain, or pain   at the shoulder -swelling -tiredness -trouble passing urine or change in the amount of urine Side effects that usually do not require medical attention (report to your doctor or health care professional if they continue or are  bothersome): -bone pain -muscle pain This list may not describe all possible side effects. Call your doctor for medical advice about side effects. You may report side effects to FDA at 1-800-FDA-1088. Where should I keep my medicine? Keep out of the reach of children. Store pre-filled syringes in a refrigerator between 2 and 8 degrees C (36 and 46 degrees F). Do not freeze. Keep in carton to protect from light. Throw away this medicine if it is left out of the refrigerator for more than 48 hours. Throw away any unused medicine after the expiration date. NOTE: This sheet is a summary. It may not cover all possible information. If you have questions about this medicine, talk to your doctor, pharmacist, or health care provider.    2016, [employer]. (2014-06-28 14:30:14)  

## 2015-11-19 ENCOUNTER — Encounter: Payer: Medicare Other | Admitting: *Deleted

## 2015-11-19 ENCOUNTER — Telehealth: Payer: Self-pay | Admitting: Cardiology

## 2015-11-19 NOTE — Telephone Encounter (Signed)
LMOVM reminding pt to send remote transmission.   

## 2015-11-22 ENCOUNTER — Encounter: Payer: Self-pay | Admitting: Cardiology

## 2015-11-27 ENCOUNTER — Other Ambulatory Visit (HOSPITAL_BASED_OUTPATIENT_CLINIC_OR_DEPARTMENT_OTHER): Payer: Medicare Other

## 2015-11-27 ENCOUNTER — Ambulatory Visit (HOSPITAL_BASED_OUTPATIENT_CLINIC_OR_DEPARTMENT_OTHER): Payer: Medicare Other | Admitting: Oncology

## 2015-11-27 ENCOUNTER — Telehealth: Payer: Self-pay | Admitting: Oncology

## 2015-11-27 ENCOUNTER — Ambulatory Visit (HOSPITAL_BASED_OUTPATIENT_CLINIC_OR_DEPARTMENT_OTHER): Payer: Medicare Other

## 2015-11-27 VITALS — BP 157/76 | HR 60 | Temp 98.1°F | Resp 17 | Ht 69.0 in | Wt 190.9 lb

## 2015-11-27 DIAGNOSIS — C661 Malignant neoplasm of right ureter: Secondary | ICD-10-CM

## 2015-11-27 DIAGNOSIS — D500 Iron deficiency anemia secondary to blood loss (chronic): Secondary | ICD-10-CM

## 2015-11-27 DIAGNOSIS — D696 Thrombocytopenia, unspecified: Secondary | ICD-10-CM

## 2015-11-27 DIAGNOSIS — R918 Other nonspecific abnormal finding of lung field: Secondary | ICD-10-CM

## 2015-11-27 DIAGNOSIS — R109 Unspecified abdominal pain: Secondary | ICD-10-CM

## 2015-11-27 DIAGNOSIS — C651 Malignant neoplasm of right renal pelvis: Secondary | ICD-10-CM

## 2015-11-27 DIAGNOSIS — Z5111 Encounter for antineoplastic chemotherapy: Secondary | ICD-10-CM | POA: Diagnosis present

## 2015-11-27 DIAGNOSIS — D6481 Anemia due to antineoplastic chemotherapy: Secondary | ICD-10-CM | POA: Diagnosis not present

## 2015-11-27 LAB — CBC WITH DIFFERENTIAL/PLATELET
BASO%: 0.5 % (ref 0.0–2.0)
BASOS ABS: 0 10*3/uL (ref 0.0–0.1)
EOS ABS: 0 10*3/uL (ref 0.0–0.5)
EOS%: 0.5 % (ref 0.0–7.0)
HCT: 32.2 % — ABNORMAL LOW (ref 38.4–49.9)
HGB: 10.8 g/dL — ABNORMAL LOW (ref 13.0–17.1)
LYMPH%: 19.3 % (ref 14.0–49.0)
MCH: 36.6 pg — ABNORMAL HIGH (ref 27.2–33.4)
MCHC: 33.5 g/dL (ref 32.0–36.0)
MCV: 109.2 fL — ABNORMAL HIGH (ref 79.3–98.0)
MONO#: 0.9 10*3/uL (ref 0.1–0.9)
MONO%: 12.7 % (ref 0.0–14.0)
NEUT%: 67 % (ref 39.0–75.0)
NEUTROS ABS: 5 10*3/uL (ref 1.5–6.5)
PLATELETS: 169 10*3/uL (ref 140–400)
RBC: 2.95 10*6/uL — AB (ref 4.20–5.82)
RDW: 19.9 % — ABNORMAL HIGH (ref 11.0–14.6)
WBC: 7.4 10*3/uL (ref 4.0–10.3)
lymph#: 1.4 10*3/uL (ref 0.9–3.3)

## 2015-11-27 LAB — COMPREHENSIVE METABOLIC PANEL
ANION GAP: 9 meq/L (ref 3–11)
AST: 19 U/L (ref 5–34)
Albumin: 3.8 g/dL (ref 3.5–5.0)
Alkaline Phosphatase: 81 U/L (ref 40–150)
BILIRUBIN TOTAL: 0.95 mg/dL (ref 0.20–1.20)
BUN: 12.8 mg/dL (ref 7.0–26.0)
CHLORIDE: 105 meq/L (ref 98–109)
CO2: 25 meq/L (ref 22–29)
CREATININE: 1.1 mg/dL (ref 0.7–1.3)
Calcium: 9.3 mg/dL (ref 8.4–10.4)
EGFR: 62 mL/min/{1.73_m2} — ABNORMAL LOW (ref >90)
GLUCOSE: 116 mg/dL (ref 70–140)
Potassium: 4.3 mEq/L (ref 3.5–5.1)
SODIUM: 140 meq/L (ref 136–145)
TOTAL PROTEIN: 7.6 g/dL (ref 6.4–8.3)

## 2015-11-27 MED ORDER — SODIUM CHLORIDE 0.9 % IV SOLN
800.0000 mg/m2 | Freq: Once | INTRAVENOUS | Status: AC
  Administered 2015-11-27: 1634 mg via INTRAVENOUS
  Filled 2015-11-27: qty 42.98

## 2015-11-27 MED ORDER — SODIUM CHLORIDE 0.9 % IV SOLN
10.0000 mg | Freq: Once | INTRAVENOUS | Status: AC
  Administered 2015-11-27: 10 mg via INTRAVENOUS
  Filled 2015-11-27: qty 1

## 2015-11-27 MED ORDER — SODIUM CHLORIDE 0.9 % IV SOLN
260.0000 mg | Freq: Once | INTRAVENOUS | Status: AC
  Administered 2015-11-27: 260 mg via INTRAVENOUS
  Filled 2015-11-27: qty 26

## 2015-11-27 MED ORDER — SODIUM CHLORIDE 0.9 % IV SOLN
Freq: Once | INTRAVENOUS | Status: AC
  Administered 2015-11-27: 11:00:00 via INTRAVENOUS

## 2015-11-27 MED ORDER — SODIUM CHLORIDE 0.9% FLUSH
10.0000 mL | INTRAVENOUS | Status: DC | PRN
  Administered 2015-11-27: 10 mL
  Filled 2015-11-27: qty 10

## 2015-11-27 MED ORDER — CARBOPLATIN CHEMO INTRADERMAL TEST DOSE 100MCG/0.02ML
100.0000 ug | Freq: Once | INTRADERMAL | Status: AC
  Administered 2015-11-27: 100 ug via INTRADERMAL
  Filled 2015-11-27: qty 0.01

## 2015-11-27 MED ORDER — PALONOSETRON HCL INJECTION 0.25 MG/5ML
0.2500 mg | Freq: Once | INTRAVENOUS | Status: AC
  Administered 2015-11-27: 0.25 mg via INTRAVENOUS

## 2015-11-27 MED ORDER — PALONOSETRON HCL INJECTION 0.25 MG/5ML
INTRAVENOUS | Status: AC
  Filled 2015-11-27: qty 5

## 2015-11-27 MED ORDER — HEPARIN SOD (PORK) LOCK FLUSH 100 UNIT/ML IV SOLN
500.0000 [IU] | Freq: Once | INTRAVENOUS | Status: AC | PRN
  Administered 2015-11-27: 500 [IU]
  Filled 2015-11-27: qty 5

## 2015-11-27 NOTE — Progress Notes (Signed)
Hematology and Oncology Follow Up Visit  [name] [id number] 07-Apr-1930 82 y.o. 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 CT scan in January 2017 showed pelvic adenopathy indicating metastatic disease.   Prior Therapy: He is status post cystoscopy and right double-J stent placement in August 2016.  Current therapy:  Palliative chemotherapy utilizing carboplatin and gemcitabine cycle one given on 08/14/2015. Cycle 2 given on 09/25/2015 because of hospitalization and neutropenia.  He is here for evaluation before cycle 5 she has been receiving every 3 weeks with dose reduction.  Interim History:  [name] presents today for a follow-up visit. Since the last visit, he tolerated the last cycle of chemotherapy without any new complications. He continues to feel relatively well. He denied any nausea or infusion related issues. He denied any hematuria or bruising. He is no longer reporting any bladder spasms or constipation. His appetite is excellent and his weight is stable.  He denied any shortness of breath or any fluid retention. He continues to show excellent quality of life without any deterioration. He denied any hospitalization or illnesses between the last cycles of chemotherapy.  He does not report any headaches, blurry vision, syncope or seizures. He does not report any fevers, chills or sweats. Does not report any appetite changes or weight loss. Does not report any chest pain, palpitation, orthopnea but does report leg edema. He does not report any cough, wheezing or hemoptysis. Does not report any nausea, vomiting, constipation or diarrhea. He does not report any skeletal complaints of arthralgias or myalgias. Does not report any lymphadenopathy or petechiae. Does not report any mood issues. Remaining review of systems  unremarkable.   Medications: I have reviewed the patient's current medications.  Current Outpatient Prescriptions  Medication Sig Dispense Refill  . acetaminophen (TYLENOL) 325 MG tablet Take 1-2 tablets (325-650 mg total) by mouth every 4 (four) hours as needed for mild pain.    Marland Kitchen clopidogrel (PLAVIX) 75 MG tablet Take 75 mg by mouth daily. For Afib/TIA    . docusate sodium (COLACE) 100 MG capsule Take 1 capsule (100 mg total) by mouth 2 (two) times daily.    . ferrous sulfate 325 (65 FE) MG tablet Take 325 mg by mouth daily with breakfast. For anemia.    Marland Kitchen mineral oil-hydrophilic petrolatum (AQUAPHOR) ointment Apply topically 2 (two) times daily. 420 g 0  . oxybutynin (DITROPAN) 5 MG tablet Take 1 tablet (5 mg total) by mouth every 8 (eight) hours as needed for bladder spasms.    . pantoprazole (PROTONIX) 40 MG tablet Take 40 mg by mouth daily. For GERD.    Marland Kitchen polyethylene glycol (MIRALAX / GLYCOLAX) packet Take 17 g by mouth daily.    . prochlorperazine (COMPAZINE) 10 MG tablet Take 10 mg by mouth every 6 (six) hours as needed for nausea or vomiting.    Marland Kitchen UNABLE TO FIND Med Name: Med pass 120 mL 3 times daily between meals as a supplement     No current facility-administered medications for this visit.     Allergies:  Allergies  Allergen Reactions  . Codeine Other (See Comments)    Blisters between fingers  . Docetaxel Rash    Past Medical History, Surgical history, Social history, and Family History were reviewed and updated.   Physical Exam: Blood pressure 157/76, pulse 60, temperature 98.1 F (36.7 C), temperature source  Oral, resp. rate 17, height 5\' 9"  (1.753 m), weight 190 lb 14.4 oz (86.592 kg), SpO2 100 %. ECOG: 1 General appearance: Well-appearing gentleman without distress. Head: Normocephalic, without obvious abnormality no oral ulcers or lesions. Neck: no adenopathy Lymph nodes: Cervical, supraclavicular, and axillary nodes normal. Heart:regular rate and rhythm,  S1, S2 normal, no murmur, click, rub or gallop Lung:chest clear, no wheezing, rales, normal symmetric air entry Abdomin: soft, non-tender, without masses or organomegaly no rebound or guarding. EXT:no edema noted. Skin: No petechiae or ecchymosis noted.   Lab Results: Lab Results  Component Value Date   WBC 7.4 11/27/2015   HGB 10.8* 11/27/2015   HCT 32.2* 11/27/2015   MCV 109.2* 11/27/2015   PLT 169 11/27/2015     Chemistry      Component Value Date/Time   NA 137 11/06/2015 1141   NA 138 09/09/2015   NA 135 09/01/2015 0601   K 4.3 11/06/2015 1141   K 4.0 09/09/2015   CL 102 09/01/2015 0601   CO2 25 11/06/2015 1141   CO2 25 09/01/2015 0601   BUN 10.7 11/06/2015 1141   BUN 12 09/09/2015   BUN 13 09/01/2015 0601   CREATININE 1.1 11/06/2015 1141   CREATININE 1.0 09/09/2015   CREATININE 0.93 09/01/2015 0601   CREATININE 0.92 08/29/2013 1519   GLU 98 09/09/2015      Component Value Date/Time   CALCIUM 9.1 11/06/2015 1141   CALCIUM 8.3* 09/01/2015 0601   ALKPHOS 82 11/06/2015 1141   ALKPHOS 101 09/09/2015   AST 19 11/06/2015 1141   AST 19 09/09/2015   ALT 9 11/06/2015 1141   ALT 9* 09/09/2015   BILITOT 1.29* 11/06/2015 1141   BILITOT 1.5* 08/27/2015 1900      Impression and Plan:   82 year old gentleman with the following issues:  1. 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 CT scan in January 2017 showed pelvic adenopathy indicating metastatic disease.  He is currently receiving salvage chemotherapy with carboplatin and gemcitabine. He Is status post cycle 2 with reduced doses of carboplatin and gemcitabine and with elimination of day 8. He tolerated the restart of chemotherapy without any major issues.  CT scan on 11/05/2015 showed excellent response to therapy with decrease in his lymphadenopathy. There is no new metastatic disease noted.  The plan is to proceed with  cycle 5 of chemotherapy for a total of 6 cycles. Repeat imaging studies will be done in July 2017.  2. IV access: Port-A-Cath and use without complications.  3. Nausea prophylaxis: Compazine is available to the patient. He has not needed any nausea medication at this time.  4. Thrombocytopenia: His platelet count appears adequate to receive his cycle of chemotherapy.  5. Bladder spasms: No recurrence of the symptoms noted at this time.  6. Anemia: Multifactorial in nature related to blood loss as well as chemotherapy-induced anemia. His hemoglobin is stable and does not require transfusion.  7. Neutropenia prophylaxis: He will require Neulasta growth factor support after each cycle. Complications associated with this injection was reviewed dating arthralgias and myalgias. He will receive Neulasta after cycle 5 and cycle 6 of therapy.  8. Follow-up: Will be in 3 weeks to start cycle 6 of chemotherapy.  [facility], MD 6/7/201710:37 AM

## 2015-11-27 NOTE — Telephone Encounter (Signed)
per pof to sch pt appt-gave ptcopy of avs-Adv pt that [facility] will call [facility] scan

## 2015-11-27 NOTE — Patient Instructions (Signed)
[place] Discharge Instructions for Patients Receiving Chemotherapy  Today you received the following chemotherapy agents Carboplatin and Gemzar.  To help prevent nausea and vomiting after your treatment, we encourage you to take your nausea medication as directed BUT NO ZOFRAN FOR 3 DAYS   If you develop nausea and vomiting that is not controlled by your nausea medication, call the clinic.   BELOW ARE SYMPTOMS THAT SHOULD BE REPORTED IMMEDIATELY:  *FEVER GREATER THAN 100.5 F  *CHILLS WITH OR WITHOUT FEVER  NAUSEA AND VOMITING THAT IS NOT CONTROLLED WITH YOUR NAUSEA MEDICATION  *UNUSUAL SHORTNESS OF BREATH  *UNUSUAL BRUISING OR BLEEDING  TENDERNESS IN MOUTH AND THROAT WITH OR WITHOUT PRESENCE OF ULCERS  *URINARY PROBLEMS  *BOWEL PROBLEMS  UNUSUAL RASH Items with * indicate a potential emergency and should be followed up as soon as possible.  Feel free to call the clinic you have any questions or concerns. The clinic phone number is [phone].  Please show the [place] at check-in to the Emergency Department and triage nurse.

## 2015-11-29 ENCOUNTER — Ambulatory Visit (HOSPITAL_BASED_OUTPATIENT_CLINIC_OR_DEPARTMENT_OTHER): Payer: Medicare Other

## 2015-11-29 VITALS — BP 141/80 | HR 64 | Temp 98.6°F | Resp 20

## 2015-11-29 DIAGNOSIS — Z5189 Encounter for other specified aftercare: Secondary | ICD-10-CM | POA: Diagnosis not present

## 2015-11-29 DIAGNOSIS — C661 Malignant neoplasm of right ureter: Secondary | ICD-10-CM

## 2015-11-29 DIAGNOSIS — C651 Malignant neoplasm of right renal pelvis: Secondary | ICD-10-CM

## 2015-11-29 MED ORDER — PEGFILGRASTIM INJECTION 6 MG/0.6ML ~~LOC~~
6.0000 mg | PREFILLED_SYRINGE | Freq: Once | SUBCUTANEOUS | Status: AC
  Administered 2015-11-29: 6 mg via SUBCUTANEOUS
  Filled 2015-11-29: qty 0.6

## 2015-11-29 NOTE — Patient Instructions (Signed)
Pegfilgrastim injection What is this medicine? PEGFILGRASTIM (PEG fil gra stim) is a long-acting granulocyte colony-stimulating factor that stimulates the growth of neutrophils, a type of white blood cell important in the body's fight against infection. It is used to reduce the incidence of fever and infection in patients with certain types of cancer who are receiving chemotherapy that affects the bone marrow, and to increase survival after being exposed to high doses of radiation. This medicine may be used for other purposes; ask your health care provider or pharmacist if you have questions. What should I tell my health care provider before I take this medicine? They need to know if you have any of these conditions: -kidney disease -latex allergy -ongoing radiation therapy -sickle cell disease -skin reactions to acrylic adhesives (On-Body Injector only) -an unusual or allergic reaction to pegfilgrastim, filgrastim, other medicines, foods, dyes, or preservatives -pregnant or trying to get pregnant -breast-feeding How should I use this medicine? This medicine is for injection under the skin. If you get this medicine at home, you will be taught how to prepare and give the pre-filled syringe or how to use the On-body Injector. Refer to the patient Instructions for Use for detailed instructions. Use exactly as directed. Take your medicine at regular intervals. Do not take your medicine more often than directed. It is important that you put your used needles and syringes in a special sharps container. Do not put them in a trash can. If you do not have a sharps container, call your pharmacist or healthcare provider to get one. Talk to your pediatrician regarding the use of this medicine in children. While this drug may be prescribed for selected conditions, precautions do apply. Overdosage: If you think you have taken too much of this medicine contact a poison control center or emergency room at  once. NOTE: This medicine is only for you. Do not share this medicine with others. What if I miss a dose? It is important not to miss your dose. Call your doctor or health care professional if you miss your dose. If you miss a dose due to an On-body Injector failure or leakage, a new dose should be administered as soon as possible using a single prefilled syringe for manual use. What may interact with this medicine? Interactions have not been studied. Give your health care provider a list of all the medicines, herbs, non-prescription drugs, or dietary supplements you use. Also tell them if you smoke, drink alcohol, or use illegal drugs. Some items may interact with your medicine. This list may not describe all possible interactions. Give your health care provider a list of all the medicines, herbs, non-prescription drugs, or dietary supplements you use. Also tell them if you smoke, drink alcohol, or use illegal drugs. Some items may interact with your medicine. What should I watch for while using this medicine? You may need blood work done while you are taking this medicine. If you are going to need a MRI, CT scan, or other procedure, tell your doctor that you are using this medicine (On-Body Injector only). What side effects may I notice from receiving this medicine? Side effects that you should report to your doctor or health care professional as soon as possible: -allergic reactions like skin rash, itching or hives, swelling of the face, lips, or tongue -dizziness -fever -pain, redness, or irritation at site where injected -pinpoint red spots on the skin -red or dark-brown urine -shortness of breath or breathing problems -stomach or side pain, or pain   at the shoulder -swelling -tiredness -trouble passing urine or change in the amount of urine Side effects that usually do not require medical attention (report to your doctor or health care professional if they continue or are  bothersome): -bone pain -muscle pain This list may not describe all possible side effects. Call your doctor for medical advice about side effects. You may report side effects to FDA at 1-800-FDA-1088. Where should I keep my medicine? Keep out of the reach of children. Store pre-filled syringes in a refrigerator between 2 and 8 degrees C (36 and 46 degrees F). Do not freeze. Keep in carton to protect from light. Throw away this medicine if it is left out of the refrigerator for more than 48 hours. Throw away any unused medicine after the expiration date. NOTE: This sheet is a summary. It may not cover all possible information. If you have questions about this medicine, talk to your doctor, pharmacist, or health care provider.    2016, [employer]. (2014-06-28 14:30:14)  

## 2015-12-18 ENCOUNTER — Other Ambulatory Visit (HOSPITAL_BASED_OUTPATIENT_CLINIC_OR_DEPARTMENT_OTHER): Payer: Medicare Other

## 2015-12-18 ENCOUNTER — Telehealth: Payer: Self-pay | Admitting: Oncology

## 2015-12-18 ENCOUNTER — Ambulatory Visit (HOSPITAL_BASED_OUTPATIENT_CLINIC_OR_DEPARTMENT_OTHER): Payer: Medicare Other | Admitting: Oncology

## 2015-12-18 ENCOUNTER — Ambulatory Visit (HOSPITAL_BASED_OUTPATIENT_CLINIC_OR_DEPARTMENT_OTHER): Payer: Medicare Other

## 2015-12-18 VITALS — BP 169/68 | HR 59 | Temp 98.0°F | Resp 18 | Ht 69.0 in | Wt 192.8 lb

## 2015-12-18 DIAGNOSIS — C651 Malignant neoplasm of right renal pelvis: Secondary | ICD-10-CM

## 2015-12-18 DIAGNOSIS — C661 Malignant neoplasm of right ureter: Secondary | ICD-10-CM

## 2015-12-18 DIAGNOSIS — D500 Iron deficiency anemia secondary to blood loss (chronic): Secondary | ICD-10-CM | POA: Diagnosis not present

## 2015-12-18 DIAGNOSIS — D6481 Anemia due to antineoplastic chemotherapy: Secondary | ICD-10-CM

## 2015-12-18 DIAGNOSIS — C659 Malignant neoplasm of unspecified renal pelvis: Secondary | ICD-10-CM

## 2015-12-18 DIAGNOSIS — Z5111 Encounter for antineoplastic chemotherapy: Secondary | ICD-10-CM

## 2015-12-18 LAB — COMPREHENSIVE METABOLIC PANEL
AST: 19 U/L (ref 5–34)
Albumin: 3.7 g/dL (ref 3.5–5.0)
Alkaline Phosphatase: 100 U/L (ref 40–150)
Anion Gap: 10 mEq/L (ref 3–11)
BUN: 13 mg/dL (ref 7.0–26.0)
CHLORIDE: 106 meq/L (ref 98–109)
CO2: 24 meq/L (ref 22–29)
CREATININE: 1.1 mg/dL (ref 0.7–1.3)
Calcium: 9.2 mg/dL (ref 8.4–10.4)
EGFR: 63 mL/min/{1.73_m2} — ABNORMAL LOW (ref >90)
GLUCOSE: 102 mg/dL (ref 70–140)
Potassium: 4.4 mEq/L (ref 3.5–5.1)
Sodium: 139 mEq/L (ref 136–145)
Total Bilirubin: 0.72 mg/dL (ref 0.20–1.20)
Total Protein: 7.6 g/dL (ref 6.4–8.3)

## 2015-12-18 LAB — CBC WITH DIFFERENTIAL/PLATELET
BASO%: 0.3 % (ref 0.0–2.0)
Basophils Absolute: 0 10*3/uL (ref 0.0–0.1)
EOS ABS: 0 10*3/uL (ref 0.0–0.5)
EOS%: 0.7 % (ref 0.0–7.0)
HEMATOCRIT: 32.5 % — AB (ref 38.4–49.9)
HEMOGLOBIN: 10.8 g/dL — AB (ref 13.0–17.1)
LYMPH%: 15.4 % (ref 14.0–49.0)
MCH: 37.3 pg — ABNORMAL HIGH (ref 27.2–33.4)
MCHC: 33.4 g/dL (ref 32.0–36.0)
MCV: 111.7 fL — AB (ref 79.3–98.0)
MONO#: 0.5 10*3/uL (ref 0.1–0.9)
MONO%: 8.6 % (ref 0.0–14.0)
NEUT%: 75 % (ref 39.0–75.0)
NEUTROS ABS: 4.8 10*3/uL (ref 1.5–6.5)
PLATELETS: 150 10*3/uL (ref 140–400)
RBC: 2.91 10*6/uL — ABNORMAL LOW (ref 4.20–5.82)
RDW: 20.6 % — AB (ref 11.0–14.6)
WBC: 6.4 10*3/uL (ref 4.0–10.3)
lymph#: 1 10*3/uL (ref 0.9–3.3)

## 2015-12-18 MED ORDER — HEPARIN SOD (PORK) LOCK FLUSH 100 UNIT/ML IV SOLN
500.0000 [IU] | Freq: Once | INTRAVENOUS | Status: AC | PRN
  Administered 2015-12-18: 500 [IU]
  Filled 2015-12-18: qty 5

## 2015-12-18 MED ORDER — PALONOSETRON HCL INJECTION 0.25 MG/5ML
INTRAVENOUS | Status: AC
  Filled 2015-12-18: qty 5

## 2015-12-18 MED ORDER — SODIUM CHLORIDE 0.9 % IV SOLN
800.0000 mg/m2 | Freq: Once | INTRAVENOUS | Status: AC
  Administered 2015-12-18: 1634 mg via INTRAVENOUS
  Filled 2015-12-18: qty 42.98

## 2015-12-18 MED ORDER — PALONOSETRON HCL INJECTION 0.25 MG/5ML
0.2500 mg | Freq: Once | INTRAVENOUS | Status: AC
  Administered 2015-12-18: 0.25 mg via INTRAVENOUS

## 2015-12-18 MED ORDER — SODIUM CHLORIDE 0.9 % IV SOLN
10.0000 mg | Freq: Once | INTRAVENOUS | Status: AC
  Administered 2015-12-18: 10 mg via INTRAVENOUS
  Filled 2015-12-18: qty 1

## 2015-12-18 MED ORDER — CARBOPLATIN CHEMO INTRADERMAL TEST DOSE 100MCG/0.02ML
100.0000 ug | Freq: Once | INTRADERMAL | Status: AC
  Administered 2015-12-18: 100 ug via INTRADERMAL
  Filled 2015-12-18: qty 0.01

## 2015-12-18 MED ORDER — SODIUM CHLORIDE 0.9 % IV SOLN
261.0000 mg | Freq: Once | INTRAVENOUS | Status: AC
  Administered 2015-12-18: 260 mg via INTRAVENOUS
  Filled 2015-12-18: qty 26

## 2015-12-18 MED ORDER — SODIUM CHLORIDE 0.9% FLUSH
10.0000 mL | INTRAVENOUS | Status: DC | PRN
  Administered 2015-12-18: 10 mL
  Filled 2015-12-18: qty 10

## 2015-12-18 MED ORDER — SODIUM CHLORIDE 0.9 % IV SOLN
Freq: Once | INTRAVENOUS | Status: AC
  Administered 2015-12-18: 13:00:00 via INTRAVENOUS

## 2015-12-18 NOTE — Patient Instructions (Signed)
[facility] Discharge Instructions for Patients Receiving Chemotherapy  Today you received the following chemotherapy agents Carboplatin/Gemzar  To help prevent nausea and vomiting after your treatment, we encourage you to take your nausea medication    If you develop nausea and vomiting that is not controlled by your nausea medication, call the clinic.   BELOW ARE SYMPTOMS THAT SHOULD BE REPORTED IMMEDIATELY:  *FEVER GREATER THAN 100.5 F  *CHILLS WITH OR WITHOUT FEVER  NAUSEA AND VOMITING THAT IS NOT CONTROLLED WITH YOUR NAUSEA MEDICATION  *UNUSUAL SHORTNESS OF BREATH  *UNUSUAL BRUISING OR BLEEDING  TENDERNESS IN MOUTH AND THROAT WITH OR WITHOUT PRESENCE OF ULCERS  *URINARY PROBLEMS  *BOWEL PROBLEMS  UNUSUAL RASH Items with * indicate a potential emergency and should be followed up as soon as possible.  Feel free to call the clinic you have any questions or concerns. The clinic phone number is [phone].  Please show the [place] at check-in to the Emergency Department and triage nurse.

## 2015-12-18 NOTE — Telephone Encounter (Signed)
Gave and printed appt sched and avs for pt for July..the patient already has barium

## 2015-12-18 NOTE — Progress Notes (Signed)
Hematology and Oncology Follow Up Visit  [name] [id number] [birth year] 82 y.o. 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 CT scan in January 2017 showed pelvic adenopathy indicating metastatic disease.   Prior Therapy: He is status post cystoscopy and right double-J stent placement in August 2016.  Current therapy:  Palliative chemotherapy utilizing carboplatin and gemcitabine cycle one given on 08/14/2015. Cycle 2 given on 09/25/2015 because of hospitalization and neutropenia.  He is here for evaluation before cycle 6 which he has been receiving with dose reduction and growth factor support.  Interim History:  Mr. Case presents today for a follow-up visit. Since the last visit, he tolerated the last cycle of chemotherapy without any new complications. He also tolerated the Neulasta without any major issues. He denied any nausea or vomiting. He denied any weakness or fatigue. He denied any hospitalization or illnesses. He denied any nausea or infusion related issues. He denied any hematuria or bruising. He is no longer reporting any bladder spasms or constipation. His appetite is excellent and his weight is increased.   He does not report any headaches, blurry vision, syncope or seizures. He does not report any fevers, chills or sweats. Does not report any appetite changes or weight loss. Does not report any chest pain, palpitation, orthopnea but does report leg edema. He does not report any cough, wheezing or hemoptysis. Does not report any nausea, vomiting, constipation or diarrhea. He does not report any skeletal complaints of arthralgias or myalgias. Does not report any lymphadenopathy or petechiae. Does not report any mood issues. Remaining review of systems unremarkable.   Medications: I have reviewed the patient's current  medications.  Current Outpatient Prescriptions  Medication Sig Dispense Refill  . acetaminophen (TYLENOL) 325 MG tablet Take 1-2 tablets (325-650 mg total) by mouth every 4 (four) hours as needed for mild pain.    Marland Kitchen clopidogrel (PLAVIX) 75 MG tablet Take 75 mg by mouth daily. For Afib/TIA    . docusate sodium (COLACE) 100 MG capsule Take 1 capsule (100 mg total) by mouth 2 (two) times daily.    . ferrous sulfate 325 (65 FE) MG tablet Take 325 mg by mouth daily with breakfast. For anemia.    Marland Kitchen mineral oil-hydrophilic petrolatum (AQUAPHOR) ointment Apply topically 2 (two) times daily. 420 g 0  . oxybutynin (DITROPAN) 5 MG tablet Take 1 tablet (5 mg total) by mouth every 8 (eight) hours as needed for bladder spasms.    . pantoprazole (PROTONIX) 40 MG tablet Take 40 mg by mouth daily. For GERD.    Marland Kitchen polyethylene glycol (MIRALAX / GLYCOLAX) packet Take 17 g by mouth daily.    . prochlorperazine (COMPAZINE) 10 MG tablet Take 10 mg by mouth every 6 (six) hours as needed for nausea or vomiting.    Marland Kitchen UNABLE TO FIND Med Name: Med pass 120 mL 3 times daily between meals as a supplement     No current facility-administered medications for this visit.     Allergies:  Allergies  Allergen Reactions  . Codeine Other (See Comments)    Blisters between fingers  . Docetaxel Rash    Past Medical History, Surgical history, Social history, and Family History were reviewed and updated.   Physical Exam: Blood pressure 169/68, pulse 59, temperature 98 F (36.7 C), temperature source Oral, resp. rate 18, height 5\' 9"  (1.753 m), weight  192 lb 12.8 oz (87.454 kg), SpO2 100 %. ECOG: 1 General appearance: Alert, awake gentleman without distress. Head: Normocephalic, without obvious abnormality no oral thrush noted. Neck: no adenopathy Lymph nodes: Cervical, supraclavicular, and axillary nodes normal. Heart:regular rate and rhythm, S1, S2 normal, no murmur, click, rub or gallop Lung:chest clear, no wheezing,  rales, normal symmetric air entry Abdomin: soft, non-tender, without masses or organomegaly no shifting dullness or ascites. EXT:no edema noted. Skin: No petechiae or ecchymosis noted.   Lab Results: Lab Results  Component Value Date   WBC 6.4 12/18/2015   HGB 10.8* 12/18/2015   HCT 32.5* 12/18/2015   MCV 111.7* 12/18/2015   PLT 150 12/18/2015     Chemistry      Component Value Date/Time   NA 140 11/27/2015 1016   NA 138 09/09/2015   NA 135 09/01/2015 0601   K 4.3 11/27/2015 1016   K 4.0 09/09/2015   CL 102 09/01/2015 0601   CO2 25 11/27/2015 1016   CO2 25 09/01/2015 0601   BUN 12.8 11/27/2015 1016   BUN 12 09/09/2015   BUN 13 09/01/2015 0601   CREATININE 1.1 11/27/2015 1016   CREATININE 1.0 09/09/2015   CREATININE 0.93 09/01/2015 0601   CREATININE 0.92 08/29/2013 1519   GLU 98 09/09/2015      Component Value Date/Time   CALCIUM 9.3 11/27/2015 1016   CALCIUM 8.3* 09/01/2015 0601   ALKPHOS 81 11/27/2015 1016   ALKPHOS 101 09/09/2015   AST 19 11/27/2015 1016   AST 19 09/09/2015   ALT <9 11/27/2015 1016   ALT 9* 09/09/2015   BILITOT 0.95 11/27/2015 1016   BILITOT 1.5* 08/27/2015 1900      Impression and Plan:   82 year old gentleman with the following issues:  1. 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 CT scan in January 2017 showed pelvic adenopathy indicating metastatic disease.  He is currently receiving salvage chemotherapy with carboplatin and gemcitabine. He Is status post cycle 2 with reduced doses of carboplatin and gemcitabine and with elimination of day 8. He tolerated the restart of chemotherapy without any major issues.  CT scan on 11/05/2015 showed excellent response to therapy with decrease in his lymphadenopathy. There is no new metastatic disease noted.  The plan is to proceed with cycle 6 of chemotherapy and repeat his CT scan in July 2017. He has  tolerated this regimen well and he is medically fit to proceed with the sixth cycle.  2. IV access: Port-A-Cath and use without complications.  3. Nausea prophylaxis: Compazine is available to the patient. He has not needed any nausea medication at this time.  4. Thrombocytopenia: His platelet count is normal at this time.  5. Bladder spasms: No recurrence of the symptoms noted at this time.  6. Anemia: Multifactorial in nature related to blood loss as well as chemotherapy-induced anemia. His hemoglobin is stable and does not require transfusion.  7. Neutropenia prophylaxis: He'll receive Neulasta after each chemotherapy cycle.  8. Follow-up: Will be in 3 weeks for repeat imaging studies.  Zola Button, MD 6/28/201712:15 PM

## 2015-12-20 ENCOUNTER — Ambulatory Visit (HOSPITAL_BASED_OUTPATIENT_CLINIC_OR_DEPARTMENT_OTHER): Payer: Medicare Other

## 2015-12-20 VITALS — BP 155/71 | HR 63 | Temp 98.2°F | Resp 20

## 2015-12-20 DIAGNOSIS — C651 Malignant neoplasm of right renal pelvis: Secondary | ICD-10-CM

## 2015-12-20 DIAGNOSIS — C661 Malignant neoplasm of right ureter: Secondary | ICD-10-CM

## 2015-12-20 DIAGNOSIS — Z5189 Encounter for other specified aftercare: Secondary | ICD-10-CM | POA: Diagnosis not present

## 2015-12-20 MED ORDER — PEGFILGRASTIM INJECTION 6 MG/0.6ML ~~LOC~~
6.0000 mg | PREFILLED_SYRINGE | Freq: Once | SUBCUTANEOUS | Status: AC
  Administered 2015-12-20: 6 mg via SUBCUTANEOUS
  Filled 2015-12-20: qty 0.6

## 2015-12-20 NOTE — Patient Instructions (Signed)
Pegfilgrastim injection What is this medicine? PEGFILGRASTIM (PEG fil gra stim) is a long-acting granulocyte colony-stimulating factor that stimulates the growth of neutrophils, a type of white blood cell important in the body's fight against infection. It is used to reduce the incidence of fever and infection in patients with certain types of cancer who are receiving chemotherapy that affects the bone marrow, and to increase survival after being exposed to high doses of radiation. This medicine may be used for other purposes; ask your health care provider or pharmacist if you have questions. What should I tell my health care provider before I take this medicine? They need to know if you have any of these conditions: -kidney disease -latex allergy -ongoing radiation therapy -sickle cell disease -skin reactions to acrylic adhesives (On-Body Injector only) -an unusual or allergic reaction to pegfilgrastim, filgrastim, other medicines, foods, dyes, or preservatives -pregnant or trying to get pregnant -breast-feeding How should I use this medicine? This medicine is for injection under the skin. If you get this medicine at home, you will be taught how to prepare and give the pre-filled syringe or how to use the On-body Injector. Refer to the patient Instructions for Use for detailed instructions. Use exactly as directed. Take your medicine at regular intervals. Do not take your medicine more often than directed. It is important that you put your used needles and syringes in a special sharps container. Do not put them in a trash can. If you do not have a sharps container, call your pharmacist or healthcare provider to get one. Talk to your pediatrician regarding the use of this medicine in children. While this drug may be prescribed for selected conditions, precautions do apply. Overdosage: If you think you have taken too much of this medicine contact a poison control center or emergency room at  once. NOTE: This medicine is only for you. Do not share this medicine with others. What if I miss a dose? It is important not to miss your dose. Call your doctor or health care professional if you miss your dose. If you miss a dose due to an On-body Injector failure or leakage, a new dose should be administered as soon as possible using a single prefilled syringe for manual use. What may interact with this medicine? Interactions have not been studied. Give your health care provider a list of all the medicines, herbs, non-prescription drugs, or dietary supplements you use. Also tell them if you smoke, drink alcohol, or use illegal drugs. Some items may interact with your medicine. This list may not describe all possible interactions. Give your health care provider a list of all the medicines, herbs, non-prescription drugs, or dietary supplements you use. Also tell them if you smoke, drink alcohol, or use illegal drugs. Some items may interact with your medicine. What should I watch for while using this medicine? You may need blood work done while you are taking this medicine. If you are going to need a MRI, CT scan, or other procedure, tell your doctor that you are using this medicine (On-Body Injector only). What side effects may I notice from receiving this medicine? Side effects that you should report to your doctor or health care professional as soon as possible: -allergic reactions like skin rash, itching or hives, swelling of the face, lips, or tongue -dizziness -fever -pain, redness, or irritation at site where injected -pinpoint red spots on the skin -red or dark-brown urine -shortness of breath or breathing problems -stomach or side pain, or pain   at the shoulder -swelling -tiredness -trouble passing urine or change in the amount of urine Side effects that usually do not require medical attention (report to your doctor or health care professional if they continue or are  bothersome): -bone pain -muscle pain This list may not describe all possible side effects. Call your doctor for medical advice about side effects. You may report side effects to FDA at 1-800-FDA-1088. Where should I keep my medicine? Keep out of the reach of children. Store pre-filled syringes in a refrigerator between 2 and 8 degrees C (36 and 46 degrees F). Do not freeze. Keep in carton to protect from light. Throw away this medicine if it is left out of the refrigerator for more than 48 hours. Throw away any unused medicine after the expiration date. NOTE: This sheet is a summary. It may not cover all possible information. If you have questions about this medicine, talk to your doctor, pharmacist, or health care provider.    2016, [employer]. (2014-06-28 14:30:14)  

## 2016-01-08 ENCOUNTER — Other Ambulatory Visit: Payer: Medicare Other

## 2016-01-08 ENCOUNTER — Ambulatory Visit (HOSPITAL_COMMUNITY)
Admission: RE | Admit: 2016-01-08 | Discharge: 2016-01-08 | Disposition: A | Discharge status: Home / Self Care | Payer: Medicare Other | Source: Ambulatory Visit | Attending: Oncology | Admitting: Oncology

## 2016-01-08 ENCOUNTER — Other Ambulatory Visit (HOSPITAL_BASED_OUTPATIENT_CLINIC_OR_DEPARTMENT_OTHER): Payer: Medicare Other

## 2016-01-08 DIAGNOSIS — N2889 Other specified disorders of kidney and ureter: Secondary | ICD-10-CM | POA: Insufficient documentation

## 2016-01-08 DIAGNOSIS — R918 Other nonspecific abnormal finding of lung field: Secondary | ICD-10-CM | POA: Diagnosis present

## 2016-01-08 DIAGNOSIS — Z9079 Acquired absence of other genital organ(s): Secondary | ICD-10-CM | POA: Diagnosis not present

## 2016-01-08 DIAGNOSIS — I700 Atherosclerosis of aorta: Secondary | ICD-10-CM | POA: Diagnosis not present

## 2016-01-08 DIAGNOSIS — I2699 Other pulmonary embolism without acute cor pulmonale: Secondary | ICD-10-CM | POA: Insufficient documentation

## 2016-01-08 DIAGNOSIS — I517 Cardiomegaly: Secondary | ICD-10-CM | POA: Insufficient documentation

## 2016-01-08 DIAGNOSIS — C679 Malignant neoplasm of bladder, unspecified: Secondary | ICD-10-CM | POA: Insufficient documentation

## 2016-01-08 DIAGNOSIS — K8020 Calculus of gallbladder without cholecystitis without obstruction: Secondary | ICD-10-CM | POA: Insufficient documentation

## 2016-01-08 DIAGNOSIS — R109 Unspecified abdominal pain: Secondary | ICD-10-CM | POA: Insufficient documentation

## 2016-01-08 DIAGNOSIS — K4090 Unilateral inguinal hernia, without obstruction or gangrene, not specified as recurrent: Secondary | ICD-10-CM | POA: Insufficient documentation

## 2016-01-08 DIAGNOSIS — K5730 Diverticulosis of large intestine without perforation or abscess without bleeding: Secondary | ICD-10-CM | POA: Insufficient documentation

## 2016-01-08 LAB — CBC WITH DIFFERENTIAL/PLATELET
BASO%: 0.4 % (ref 0.0–2.0)
BASOS ABS: 0 10*3/uL (ref 0.0–0.1)
EOS%: 0.5 % (ref 0.0–7.0)
Eosinophils Absolute: 0 10*3/uL (ref 0.0–0.5)
HEMATOCRIT: 31.9 % — AB (ref 38.4–49.9)
HEMOGLOBIN: 10.7 g/dL — AB (ref 13.0–17.1)
LYMPH#: 1.1 10*3/uL (ref 0.9–3.3)
LYMPH%: 16.5 % (ref 14.0–49.0)
MCH: 38.2 pg — AB (ref 27.2–33.4)
MCHC: 33.5 g/dL (ref 32.0–36.0)
MCV: 114 fL — ABNORMAL HIGH (ref 79.3–98.0)
MONO#: 0.8 10*3/uL (ref 0.1–0.9)
MONO%: 12.5 % (ref 0.0–14.0)
NEUT#: 4.5 10*3/uL (ref 1.5–6.5)
NEUT%: 70.1 % (ref 39.0–75.0)
PLATELETS: 123 10*3/uL — AB (ref 140–400)
RBC: 2.8 10*6/uL — ABNORMAL LOW (ref 4.20–5.82)
RDW: 18.9 % — AB (ref 11.0–14.6)
WBC: 6.4 10*3/uL (ref 4.0–10.3)

## 2016-01-08 LAB — COMPREHENSIVE METABOLIC PANEL
ALBUMIN: 3.7 g/dL (ref 3.5–5.0)
ALK PHOS: 107 U/L (ref 40–150)
ANION GAP: 11 meq/L (ref 3–11)
AST: 21 U/L (ref 5–34)
BUN: 16.8 mg/dL (ref 7.0–26.0)
CALCIUM: 8.9 mg/dL (ref 8.4–10.4)
CO2: 24 mEq/L (ref 22–29)
CREATININE: 1.3 mg/dL (ref 0.7–1.3)
Chloride: 104 mEq/L (ref 98–109)
EGFR: 49 mL/min/{1.73_m2} — ABNORMAL LOW (ref >90)
Glucose: 128 mg/dl (ref 70–140)
Potassium: 4.7 mEq/L (ref 3.5–5.1)
Sodium: 140 mEq/L (ref 136–145)
Total Bilirubin: 1.71 mg/dL — ABNORMAL HIGH (ref 0.20–1.20)
Total Protein: 7.5 g/dL (ref 6.4–8.3)

## 2016-01-08 MED ORDER — IOPAMIDOL (ISOVUE-300) INJECTION 61%
100.0000 mL | Freq: Once | INTRAVENOUS | Status: AC | PRN
  Administered 2016-01-08: 100 mL via INTRAVENOUS

## 2016-01-10 ENCOUNTER — Ambulatory Visit (HOSPITAL_BASED_OUTPATIENT_CLINIC_OR_DEPARTMENT_OTHER): Payer: Medicare Other | Admitting: Oncology

## 2016-01-10 ENCOUNTER — Telehealth: Payer: Self-pay | Admitting: Oncology

## 2016-01-10 ENCOUNTER — Ambulatory Visit: Payer: Medicare Other | Admitting: Oncology

## 2016-01-10 VITALS — BP 193/83 | HR 100 | Temp 98.1°F | Resp 18 | Ht 69.0 in | Wt 195.9 lb

## 2016-01-10 DIAGNOSIS — R599 Enlarged lymph nodes, unspecified: Secondary | ICD-10-CM

## 2016-01-10 DIAGNOSIS — D6481 Anemia due to antineoplastic chemotherapy: Secondary | ICD-10-CM | POA: Diagnosis not present

## 2016-01-10 DIAGNOSIS — D500 Iron deficiency anemia secondary to blood loss (chronic): Secondary | ICD-10-CM

## 2016-01-10 DIAGNOSIS — C775 Secondary and unspecified malignant neoplasm of intrapelvic lymph nodes: Secondary | ICD-10-CM | POA: Diagnosis not present

## 2016-01-10 DIAGNOSIS — C661 Malignant neoplasm of right ureter: Secondary | ICD-10-CM | POA: Diagnosis present

## 2016-01-10 DIAGNOSIS — I2699 Other pulmonary embolism without acute cor pulmonale: Secondary | ICD-10-CM

## 2016-01-10 NOTE — Progress Notes (Signed)
Hematology and Oncology Follow Up Visit  [name] [id number] September 20, 1929 82 y.o. 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 CT scan in January 2017 showed pelvic adenopathy indicating metastatic disease.   Prior Therapy:  He is status post cystoscopy and right double-J stent placement in August 2016. Palliative chemotherapy utilizing carboplatin and gemcitabine cycle one given on 08/14/2015. Cycle 2 given on 09/25/2015 because of hospitalization and neutropenia. He completed 6 cycles of therapy in June 2017.  Current therapy: Observation and surveillance.   Interim History:  Mr. [name] presents today for a follow-up visit. Since the last visit, he reports no recent complaints. He completed chemotherapy without any delayed complications. He denied any nausea or vomiting. He denied any weakness or fatigue. He denied any hospitalization or illnesses. He denied any nausea or infusion related issues. He did report one episode of hematuria early July which have resolved at this time. He denied any dyspnea on exertion or any respiratory symptoms. He denied any chest pain or pleurisy.   He does not report any headaches, blurry vision, syncope or seizures. He does not report any fevers, chills or sweats. Does not report any appetite changes or weight loss. Does not report any chest pain, palpitation, orthopnea but does report leg edema. He does not report any cough, wheezing or hemoptysis. Does not report any nausea, vomiting, constipation or diarrhea. He does not report any skeletal complaints of arthralgias or myalgias. Does not report any lymphadenopathy or petechiae. Does not report any mood issues. Remaining review of systems unremarkable.   Medications: I have reviewed the patient's current medications.  Current Outpatient  Prescriptions  Medication Sig Dispense Refill  . clopidogrel (PLAVIX) 75 MG tablet Take 75 mg by mouth daily. For Afib/TIA    . docusate sodium (COLACE) 100 MG capsule Take 1 capsule (100 mg total) by mouth 2 (two) times daily.    . pantoprazole (PROTONIX) 40 MG tablet Take 40 mg by mouth daily. For GERD.    Marland Kitchen polyethylene glycol (MIRALAX / GLYCOLAX) packet Take 17 g by mouth daily.    Marland Kitchen UNABLE TO FIND Med Name: Med pass 120 mL 3 times daily between meals as a supplement    . acetaminophen (TYLENOL) 325 MG tablet Take 1-2 tablets (325-650 mg total) by mouth every 4 (four) hours as needed for mild pain. (Patient not taking: Reported on 01/10/2016)    . ferrous sulfate 325 (65 FE) MG tablet Take 325 mg by mouth daily with breakfast. Reported on 01/10/2016    . mineral oil-hydrophilic petrolatum (AQUAPHOR) ointment Apply topically 2 (two) times daily. (Patient not taking: Reported on 01/10/2016) 420 g 0  . oxybutynin (DITROPAN) 5 MG tablet Take 1 tablet (5 mg total) by mouth every 8 (eight) hours as needed for bladder spasms. (Patient not taking: Reported on 01/10/2016)    . prochlorperazine (COMPAZINE) 10 MG tablet Take 10 mg by mouth every 6 (six) hours as needed for nausea or vomiting. Reported on 01/10/2016     No current facility-administered medications for this visit.     Allergies:  Allergies  Allergen Reactions  . Codeine Other (See Comments)    Blisters between fingers  . Docetaxel Rash    Past Medical History, Surgical history, Social history, and Family History were reviewed and updated.   Physical Exam: Blood pressure 193/83, pulse 100, temperature 98.1 F (36.7  C), temperature source Oral, resp. rate 18, height 5\' 9"  (1.753 m), weight 195 lb 14.4 oz (88.86 kg), SpO2 100 %. ECOG: 1 General appearance: Alert, awake gentleman without distress. Head: Normocephalic, without obvious abnormality no oral ulcers or lesions. Neck: no adenopathy Lymph nodes: Cervical, supraclavicular,  and axillary nodes normal. Heart:regular rate and rhythm, S1, S2 normal, no murmur, click, rub or gallop Lung:chest clear, no wheezing, rales, normal symmetric air entry Abdomin: soft, non-tender, without masses or organomegaly no rebound or guarding. EXT:no edema noted. Skin: No petechiae or ecchymosis noted.   Lab Results: Lab Results  Component Value Date   WBC 6.4 01/08/2016   HGB 10.7* 01/08/2016   HCT 31.9* 01/08/2016   MCV 114.0* 01/08/2016   PLT 123* 01/08/2016     Chemistry      Component Value Date/Time   NA 140 01/08/2016 1109   NA 138 09/09/2015   NA 135 09/01/2015 0601   K 4.7 01/08/2016 1109   K 4.0 09/09/2015   CL 102 09/01/2015 0601   CO2 24 01/08/2016 1109   CO2 25 09/01/2015 0601   BUN 16.8 01/08/2016 1109   BUN 12 09/09/2015   BUN 13 09/01/2015 0601   CREATININE 1.3 01/08/2016 1109   CREATININE 1.0 09/09/2015   CREATININE 0.93 09/01/2015 0601   CREATININE 0.92 08/29/2013 1519   GLU 98 09/09/2015      Component Value Date/Time   CALCIUM 8.9 01/08/2016 1109   CALCIUM 8.3* 09/01/2015 0601   ALKPHOS 107 01/08/2016 1109   ALKPHOS 101 09/09/2015   AST 21 01/08/2016 1109   AST 19 09/09/2015   ALT <9 01/08/2016 1109   ALT 9* 09/09/2015   BILITOT 1.71* 01/08/2016 1109   BILITOT 1.5* 08/27/2015 1900     EXAM: CT CHEST, ABDOMEN, AND PELVIS WITH CONTRAST  TECHNIQUE: Multidetector CT imaging of the chest, abdomen and pelvis was performed following the standard protocol during bolus administration of intravenous contrast.  CONTRAST: 100 cc Isovue 300  COMPARISON: 11/05/2015  FINDINGS: CT CHEST FINDINGS  Cardiovascular: Coronary, aortic arch, and branch vessel atherosclerotic vascular disease. Mild cardiomegaly. Defibrillator noted with atrial and ventricular leads. Right Port-A-Cath tip: Upper right atrium.  Subtle hypodensity in the right lower lobe pulmonary artery, suspicious for pulmonary embolus, image  32/2.  Mediastinum/Nodes: 8 mm hypodense nodule inferiorly in the right thyroid lobe image 6/2. No pathologic thoracic adenopathy.  Lungs/Pleura: Scarring in the posterior basal segment right lower lobe. Small calcified granuloma in the right lower lobe, image 52/4. Calcified granuloma in the left lower lobe, image 95/4. Several other calcified granulomas are present in the lungs. No findings worrisome for pulmonary metastatic disease.  Musculoskeletal: Thoracic spondylosis. Metal density structure in the left neural foramen at T5-6, with adjacent facet fusion. There is also facet fusion on the right at this and adjacent levels. Old left posterior rib fractures.  CT ABDOMEN PELVIS FINDINGS  Hepatobiliary: Numerous gallstones fill about 75% of the gallbladder.  Pancreas: Unremarkable  Spleen: Unremarkable  Adrenals/Urinary Tract: Adrenal glands normal. Scarring, left kidney upper pole. Retained fetal lobularity of the kidneys.  Fullness of the right renal collecting system and proximal ureteral wall noted, extending along a mid kidney infundibulum, generally similar appearance to prior. There is some borderline wall thickening distally in the right ureter no left ureteral tumor noted.  Fatty density in the wall of the urinary bladder. Prior transurethral resection of the prostate.  Stomach/Bowel: Sigmoid colon diverticulosis. The appendiceal tip extends along with the direct right inguinal  hernia. No active diverticulitis.  Vascular/Lymphatic: Aortoiliac atherosclerotic vascular disease. Right common iliac node 0.8 cm in short axis on image 88/2, formerly 0.7 cm by my measurement. No pathologic adenopathy is identified in the retroperitoneum.  Reproductive: Transurethral resection of the prostate. Enlarged prostate gland.  Other: No supplemental non-categorized findings.  Musculoskeletal: Direct right inguinal hernia containing adipose tissue  extending down towards the scrotum, as before. Degenerative arthropathy of both hips. Chronic appearing superior endplate compression at the L2 vertebral level.  IMPRESSION: 1. Right lower lobe pulmonary embolus, with some peripheralization of the thrombus suggesting late subacute/early chronic embolus. 2. No significant adenopathy. 3. The continues to be some wall thickening in the right renal collecting system, along an infundibulum in the right mid kidney,, and potentially in the right proximal ureter. This could represent scarring from the prior stent although residual tumor is not excluded in this vicinity. 4. Coronary, aortic arch, and branch vessel atherosclerotic vascular disease. Mild cardiomegaly. 5. Cholelithiasis. 6. Sigmoid colon diverticulosis. 7. Prior transurethral resection of prostate. 8. Direct right inguinal hernia contains adipose tissue. This also contains the appendiceal tip. These results were called by telephone at the time of interpretation on 01/08/2016 at 4:50 pm to Dr. Zola Button , who verbally acknowledged these results.   Impression and Plan:   82 year old gentleman with the following issues:  1. 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 CT scan in January 2017 showed pelvic adenopathy indicating metastatic disease.  He is currently receiving salvage chemotherapy with carboplatin and gemcitabine. He Is status post cycle 2 with reduced doses of carboplatin and gemcitabine and with elimination of day 8. He tolerated the restart of chemotherapy without any major issues.  CT scan on 11/05/2015 showed excellent response to therapy with decrease in his lymphadenopathy. There is no new metastatic disease noted.  He completed 6 cycles of chemotherapy in June 2017. CT scan on 01/08/2016 was reviewed and discussed with the patient today. He continues to have excellent  response to therapy without any clear-cut residual tumor. The plan is to continue with observation and surveillance at this time and use salvage therapy upon progression. He would be an excellent candidate for immunotherapy as a second line upon progression.  2. IV access: Port-A-Cath will be flushed every 6 weeks.  3. Nausea prophylaxis: This is no longer an issue upon completing chemotherapy.  4. Thrombocytopenia: His platelet count is normal at this time.  5. Small pulmonary embolism: He appears to be resolving and asymptomatic at this time. Follow-up CT scan will ensure resolution. Full dose anticoagulation will be used of persistent thrombus is noted.  6. Anemia: Multifactorial in nature related to blood loss as well as chemotherapy-induced anemia. His hemoglobin is stable and does not require transfusion  7. Follow-up: Will be in 3 months after repeat imaging studies.  Zola Button, MD 7/21/201712:15 PM

## 2016-01-10 NOTE — Telephone Encounter (Signed)
Gave pt cal & avs °

## 2016-02-19 ENCOUNTER — Telehealth: Payer: Self-pay | Admitting: Oncology

## 2016-02-19 NOTE — Telephone Encounter (Signed)
PATIENT CALLED TO CONF APPT DATE AND TIME. 02/19/16

## 2016-02-21 ENCOUNTER — Ambulatory Visit (HOSPITAL_BASED_OUTPATIENT_CLINIC_OR_DEPARTMENT_OTHER): Payer: Medicare Other

## 2016-02-21 DIAGNOSIS — Z452 Encounter for adjustment and management of vascular access device: Secondary | ICD-10-CM

## 2016-02-21 DIAGNOSIS — Z95828 Presence of other vascular implants and grafts: Secondary | ICD-10-CM

## 2016-02-21 DIAGNOSIS — C661 Malignant neoplasm of right ureter: Secondary | ICD-10-CM | POA: Diagnosis not present

## 2016-02-21 DIAGNOSIS — C775 Secondary and unspecified malignant neoplasm of intrapelvic lymph nodes: Secondary | ICD-10-CM | POA: Diagnosis not present

## 2016-02-21 DIAGNOSIS — C659 Malignant neoplasm of unspecified renal pelvis: Secondary | ICD-10-CM

## 2016-02-21 MED ORDER — SODIUM CHLORIDE 0.9 % IJ SOLN
10.0000 mL | INTRAMUSCULAR | Status: DC | PRN
  Administered 2016-02-21: 10 mL via INTRAVENOUS
  Filled 2016-02-21: qty 10

## 2016-02-21 MED ORDER — HEPARIN SOD (PORK) LOCK FLUSH 100 UNIT/ML IV SOLN
500.0000 [IU] | Freq: Once | INTRAVENOUS | Status: AC | PRN
  Administered 2016-02-21: 500 [IU] via INTRAVENOUS
  Filled 2016-02-21: qty 5

## 2016-02-28 ENCOUNTER — Inpatient Hospital Stay (HOSPITAL_COMMUNITY)
Admission: EM | Admit: 2016-02-28 | Discharge: 2016-03-04 | DRG: 100 | Disposition: A | Discharge status: Skilled Nursing Facility | Payer: Medicare Other | Attending: Internal Medicine | Admitting: Internal Medicine

## 2016-02-28 ENCOUNTER — Emergency Department (HOSPITAL_COMMUNITY): Payer: Medicare Other

## 2016-02-28 ENCOUNTER — Other Ambulatory Visit (HOSPITAL_COMMUNITY): Payer: Self-pay

## 2016-02-28 ENCOUNTER — Other Ambulatory Visit: Payer: Self-pay

## 2016-02-28 ENCOUNTER — Encounter (HOSPITAL_COMMUNITY): Payer: Self-pay

## 2016-02-28 ENCOUNTER — Observation Stay (HOSPITAL_COMMUNITY): Payer: Medicare Other

## 2016-02-28 DIAGNOSIS — Z923 Personal history of irradiation: Secondary | ICD-10-CM

## 2016-02-28 DIAGNOSIS — E860 Dehydration: Secondary | ICD-10-CM | POA: Diagnosis present

## 2016-02-28 DIAGNOSIS — G936 Cerebral edema: Secondary | ICD-10-CM | POA: Diagnosis present

## 2016-02-28 DIAGNOSIS — R55 Syncope and collapse: Secondary | ICD-10-CM

## 2016-02-28 DIAGNOSIS — Z885 Allergy status to narcotic agent status: Secondary | ICD-10-CM

## 2016-02-28 DIAGNOSIS — Z7902 Long term (current) use of antithrombotics/antiplatelets: Secondary | ICD-10-CM

## 2016-02-28 DIAGNOSIS — I4821 Permanent atrial fibrillation: Secondary | ICD-10-CM | POA: Diagnosis present

## 2016-02-28 DIAGNOSIS — Z79899 Other long term (current) drug therapy: Secondary | ICD-10-CM

## 2016-02-28 DIAGNOSIS — F1010 Alcohol abuse, uncomplicated: Secondary | ICD-10-CM | POA: Diagnosis present

## 2016-02-28 DIAGNOSIS — R1310 Dysphagia, unspecified: Secondary | ICD-10-CM | POA: Diagnosis present

## 2016-02-28 DIAGNOSIS — R319 Hematuria, unspecified: Secondary | ICD-10-CM | POA: Diagnosis present

## 2016-02-28 DIAGNOSIS — E871 Hypo-osmolality and hyponatremia: Secondary | ICD-10-CM | POA: Diagnosis present

## 2016-02-28 DIAGNOSIS — Z85828 Personal history of other malignant neoplasm of skin: Secondary | ICD-10-CM

## 2016-02-28 DIAGNOSIS — W19XXXA Unspecified fall, initial encounter: Secondary | ICD-10-CM | POA: Diagnosis present

## 2016-02-28 DIAGNOSIS — R32 Unspecified urinary incontinence: Secondary | ICD-10-CM | POA: Diagnosis present

## 2016-02-28 DIAGNOSIS — I350 Nonrheumatic aortic (valve) stenosis: Secondary | ICD-10-CM | POA: Diagnosis present

## 2016-02-28 DIAGNOSIS — R778 Other specified abnormalities of plasma proteins: Secondary | ICD-10-CM

## 2016-02-28 DIAGNOSIS — I4581 Long QT syndrome: Secondary | ICD-10-CM | POA: Diagnosis present

## 2016-02-28 DIAGNOSIS — R930 Abnormal findings on diagnostic imaging of skull and head, not elsewhere classified: Secondary | ICD-10-CM

## 2016-02-28 DIAGNOSIS — R7989 Other specified abnormal findings of blood chemistry: Secondary | ICD-10-CM

## 2016-02-28 DIAGNOSIS — R451 Restlessness and agitation: Secondary | ICD-10-CM | POA: Diagnosis present

## 2016-02-28 DIAGNOSIS — C7931 Secondary malignant neoplasm of brain: Secondary | ICD-10-CM

## 2016-02-28 DIAGNOSIS — R569 Unspecified convulsions: Principal | ICD-10-CM | POA: Diagnosis present

## 2016-02-28 DIAGNOSIS — Z85818 Personal history of malignant neoplasm of other sites of lip, oral cavity, and pharynx: Secondary | ICD-10-CM

## 2016-02-28 DIAGNOSIS — I10 Essential (primary) hypertension: Secondary | ICD-10-CM | POA: Diagnosis not present

## 2016-02-28 DIAGNOSIS — I2510 Atherosclerotic heart disease of native coronary artery without angina pectoris: Secondary | ICD-10-CM | POA: Diagnosis present

## 2016-02-28 DIAGNOSIS — H9190 Unspecified hearing loss, unspecified ear: Secondary | ICD-10-CM | POA: Diagnosis present

## 2016-02-28 DIAGNOSIS — D696 Thrombocytopenia, unspecified: Secondary | ICD-10-CM | POA: Diagnosis present

## 2016-02-28 DIAGNOSIS — E119 Type 2 diabetes mellitus without complications: Secondary | ICD-10-CM | POA: Diagnosis present

## 2016-02-28 DIAGNOSIS — Z8673 Personal history of transient ischemic attack (TIA), and cerebral infarction without residual deficits: Secondary | ICD-10-CM

## 2016-02-28 DIAGNOSIS — R9089 Other abnormal findings on diagnostic imaging of central nervous system: Secondary | ICD-10-CM

## 2016-02-28 DIAGNOSIS — C661 Malignant neoplasm of right ureter: Secondary | ICD-10-CM | POA: Diagnosis present

## 2016-02-28 DIAGNOSIS — Z950 Presence of cardiac pacemaker: Secondary | ICD-10-CM | POA: Diagnosis present

## 2016-02-28 DIAGNOSIS — C679 Malignant neoplasm of bladder, unspecified: Secondary | ICD-10-CM | POA: Diagnosis present

## 2016-02-28 DIAGNOSIS — E876 Hypokalemia: Secondary | ICD-10-CM

## 2016-02-28 DIAGNOSIS — Z95828 Presence of other vascular implants and grafts: Secondary | ICD-10-CM

## 2016-02-28 DIAGNOSIS — D61818 Other pancytopenia: Secondary | ICD-10-CM | POA: Diagnosis present

## 2016-02-28 DIAGNOSIS — C801 Malignant (primary) neoplasm, unspecified: Secondary | ICD-10-CM

## 2016-02-28 DIAGNOSIS — C659 Malignant neoplasm of unspecified renal pelvis: Secondary | ICD-10-CM | POA: Diagnosis present

## 2016-02-28 DIAGNOSIS — E785 Hyperlipidemia, unspecified: Secondary | ICD-10-CM | POA: Diagnosis present

## 2016-02-28 DIAGNOSIS — I482 Chronic atrial fibrillation: Secondary | ICD-10-CM | POA: Diagnosis present

## 2016-02-28 DIAGNOSIS — N400 Enlarged prostate without lower urinary tract symptoms: Secondary | ICD-10-CM | POA: Diagnosis present

## 2016-02-28 DIAGNOSIS — S065X9A Traumatic subdural hemorrhage with loss of consciousness of unspecified duration, initial encounter: Secondary | ICD-10-CM | POA: Diagnosis present

## 2016-02-28 DIAGNOSIS — Z8249 Family history of ischemic heart disease and other diseases of the circulatory system: Secondary | ICD-10-CM

## 2016-02-28 DIAGNOSIS — I248 Other forms of acute ischemic heart disease: Secondary | ICD-10-CM | POA: Diagnosis present

## 2016-02-28 DIAGNOSIS — R4020 Unspecified coma: Secondary | ICD-10-CM

## 2016-02-28 DIAGNOSIS — Z888 Allergy status to other drugs, medicaments and biological substances status: Secondary | ICD-10-CM

## 2016-02-28 DIAGNOSIS — H918X1 Other specified hearing loss, right ear: Secondary | ICD-10-CM | POA: Diagnosis present

## 2016-02-28 DIAGNOSIS — K219 Gastro-esophageal reflux disease without esophagitis: Secondary | ICD-10-CM | POA: Diagnosis present

## 2016-02-28 DIAGNOSIS — Z22322 Carrier or suspected carrier of Methicillin resistant Staphylococcus aureus: Secondary | ICD-10-CM

## 2016-02-28 DIAGNOSIS — Z9221 Personal history of antineoplastic chemotherapy: Secondary | ICD-10-CM

## 2016-02-28 LAB — URINALYSIS, ROUTINE W REFLEX MICROSCOPIC
Bilirubin Urine: NEGATIVE
GLUCOSE, UA: 100 mg/dL — AB
KETONES UR: NEGATIVE mg/dL
LEUKOCYTES UA: NEGATIVE
Nitrite: NEGATIVE
PH: 6.5 (ref 5.0–8.0)
Protein, ur: 30 mg/dL — AB
Specific Gravity, Urine: 1.017 (ref 1.005–1.030)

## 2016-02-28 LAB — CBC WITH DIFFERENTIAL/PLATELET
BASOS PCT: 0 %
Basophils Absolute: 0 10*3/uL (ref 0.0–0.1)
EOS ABS: 0 10*3/uL (ref 0.0–0.7)
Eosinophils Relative: 0 %
HEMATOCRIT: 37.9 % — AB (ref 39.0–52.0)
Hemoglobin: 13.2 g/dL (ref 13.0–17.0)
LYMPHS ABS: 0.4 10*3/uL — AB (ref 0.7–4.0)
Lymphocytes Relative: 5 %
MCH: 38.2 pg — AB (ref 26.0–34.0)
MCHC: 34.8 g/dL (ref 30.0–36.0)
MCV: 109.5 fL — ABNORMAL HIGH (ref 78.0–100.0)
MONO ABS: 0.4 10*3/uL (ref 0.1–1.0)
MONOS PCT: 4 %
Neutro Abs: 8.1 10*3/uL — ABNORMAL HIGH (ref 1.7–7.7)
Neutrophils Relative %: 91 %
Platelets: 109 10*3/uL — ABNORMAL LOW (ref 150–400)
RBC: 3.46 MIL/uL — ABNORMAL LOW (ref 4.22–5.81)
RDW: 12.8 % (ref 11.5–15.5)
WBC: 9 10*3/uL (ref 4.0–10.5)

## 2016-02-28 LAB — PROTIME-INR
INR: 1.08
Prothrombin Time: 14.1 seconds (ref 11.4–15.2)

## 2016-02-28 LAB — ETHANOL

## 2016-02-28 LAB — COMPREHENSIVE METABOLIC PANEL
ALBUMIN: 3.4 g/dL — AB (ref 3.5–5.0)
ALK PHOS: 113 U/L (ref 38–126)
ALT: 12 U/L — ABNORMAL LOW (ref 17–63)
ANION GAP: 8 (ref 5–15)
AST: 29 U/L (ref 15–41)
BILIRUBIN TOTAL: 1.8 mg/dL — AB (ref 0.3–1.2)
BUN: 11 mg/dL (ref 6–20)
CALCIUM: 8.8 mg/dL — AB (ref 8.9–10.3)
CO2: 29 mmol/L (ref 22–32)
Chloride: 97 mmol/L — ABNORMAL LOW (ref 101–111)
Creatinine, Ser: 1.03 mg/dL (ref 0.61–1.24)
GFR calc non Af Amer: >60 mL/min (ref >60)
GLUCOSE: 187 mg/dL — AB (ref 65–99)
POTASSIUM: 3.2 mmol/L — AB (ref 3.5–5.1)
SODIUM: 134 mmol/L — AB (ref 135–145)
TOTAL PROTEIN: 7.3 g/dL (ref 6.5–8.1)

## 2016-02-28 LAB — URINE MICROSCOPIC-ADD ON

## 2016-02-28 LAB — RAPID URINE DRUG SCREEN, HOSP PERFORMED
AMPHETAMINES: NOT DETECTED
Barbiturates: NOT DETECTED
Benzodiazepines: NOT DETECTED
COCAINE: NOT DETECTED
OPIATES: NOT DETECTED
TETRAHYDROCANNABINOL: NOT DETECTED

## 2016-02-28 LAB — TROPONIN I: Troponin I: 0.09 ng/mL (ref <0.03)

## 2016-02-28 MED ORDER — IOPAMIDOL (ISOVUE-300) INJECTION 61%
INTRAVENOUS | Status: AC
  Filled 2016-02-28: qty 50

## 2016-02-28 MED ORDER — SODIUM CHLORIDE 0.9 % IV SOLN
1000.0000 mg | Freq: Once | INTRAVENOUS | Status: AC
  Administered 2016-02-28: 1000 mg via INTRAVENOUS
  Filled 2016-02-28: qty 10

## 2016-02-28 MED ORDER — POTASSIUM CHLORIDE 10 MEQ/100ML IV SOLN
10.0000 meq | Freq: Once | INTRAVENOUS | Status: AC
  Administered 2016-02-28: 10 meq via INTRAVENOUS
  Filled 2016-02-28: qty 100

## 2016-02-28 MED ORDER — LORAZEPAM 2 MG/ML IJ SOLN
0.5000 mg | Freq: Once | INTRAMUSCULAR | Status: AC
  Administered 2016-02-28: 0.5 mg via INTRAVENOUS
  Filled 2016-02-28: qty 1

## 2016-02-28 MED ORDER — POTASSIUM CHLORIDE CRYS ER 20 MEQ PO TBCR
40.0000 meq | EXTENDED_RELEASE_TABLET | Freq: Once | ORAL | Status: DC
Start: 2016-02-28 — End: 2016-02-29

## 2016-02-28 MED ORDER — IOPAMIDOL (ISOVUE-300) INJECTION 61%
INTRAVENOUS | Status: AC
  Filled 2016-02-28: qty 100

## 2016-02-28 MED ORDER — FENTANYL CITRATE (PF) 100 MCG/2ML IJ SOLN
6.5000 ug | INTRAMUSCULAR | Status: AC | PRN
  Administered 2016-02-28 (×2): 6.5 ug via INTRAVENOUS
  Filled 2016-02-28: qty 2

## 2016-02-28 MED ORDER — FENTANYL CITRATE (PF) 100 MCG/2ML IJ SOLN
25.0000 ug | Freq: Once | INTRAMUSCULAR | Status: AC
  Administered 2016-02-28: 25 ug via INTRAVENOUS
  Filled 2016-02-28: qty 2

## 2016-02-28 MED ORDER — IOPAMIDOL (ISOVUE-300) INJECTION 61%
INTRAVENOUS | Status: AC
  Administered 2016-02-28: 75 mL
  Filled 2016-02-28: qty 30

## 2016-02-28 NOTE — ED Notes (Signed)
Pt had seizure episode in the room, lasting a couple minutes. Pt presented with mild shaking, posturing with arms drawn into his body. Some blood noted in airway, which was suctioned by yankauer tip catheter. Pt rolled onto L side. +urination. +postictal state afterwards.

## 2016-02-28 NOTE — ED Notes (Signed)
Taken to CT.

## 2016-02-28 NOTE — ED Notes (Addendum)
Pt refuses to be sat up in order to do stroke swallow screen, d/t continuous back pain.   Placed condom catheter and drainage bag on pt in order to collect urine as pt also refuses to allow an in & out catheterization.

## 2016-02-28 NOTE — ED Provider Notes (Signed)
Pt received at sign out with labs, CT pending.  Results below. T/C to Neuro Dr. Leonel Ramsay, case discussed, including:  HPI, pertinent PM/SHx, VS/PE, dx testing, ED course and treatment:  He has viewed the CT images, states SAH is very small and could possibly be a calcification (there would be no acute intervention for it), requests to order MRI brain w/wo, agreeable to consult, requests to admit to medicine service. Dx and testing d/w pt and family.  Questions answered.  Verb understanding, agreeable to admit. T/C to [facility] Dr. Algis Liming, case discussed, including:  HPI, pertinent PM/SHx, VS/PE, dx testing, ED course and treatment:  Agreeable to admit, requests to write temporary orders, obtain observation tele bed to team [facility].    Results for orders placed or performed during the hospital encounter of 02/28/16  CBC with Differential/Platelet  Result Value Ref Range   WBC 9.0 4.0 - 10.5 K/uL   RBC 3.46 (L) 4.22 - 5.81 MIL/uL   Hemoglobin 13.2 13.0 - 17.0 g/dL   HCT 37.9 (L) 39.0 - 52.0 %   MCV 109.5 (H) 78.0 - 100.0 fL   MCH 38.2 (H) 26.0 - 34.0 pg   MCHC 34.8 30.0 - 36.0 g/dL   RDW 12.8 11.5 - 15.5 %   Platelets 109 (L) 150 - 400 K/uL   Neutrophils Relative % 91 %   Neutro Abs 8.1 (H) 1.7 - 7.7 K/uL   Lymphocytes Relative 5 %   Lymphs Abs 0.4 (L) 0.7 - 4.0 K/uL   Monocytes Relative 4 %   Monocytes Absolute 0.4 0.1 - 1.0 K/uL   Eosinophils Relative 0 %   Eosinophils Absolute 0.0 0.0 - 0.7 K/uL   Basophils Relative 0 %   Basophils Absolute 0.0 0.0 - 0.1 K/uL  Comprehensive metabolic panel  Result Value Ref Range   Sodium 134 (L) 135 - 145 mmol/L   Potassium 3.2 (L) 3.5 - 5.1 mmol/L   Chloride 97 (L) 101 - 111 mmol/L   CO2 29 22 - 32 mmol/L   Glucose, Bld 187 (H) 65 - 99 mg/dL   BUN 11 6 - 20 mg/dL   Creatinine, Ser 1.03 0.61 - 1.24 mg/dL   Calcium 8.8 (L) 8.9 - 10.3 mg/dL   Total Protein 7.3 6.5 - 8.1 g/dL   Albumin 3.4 (L) 3.5 - 5.0 g/dL   AST 29 15 - 41 U/L   ALT 12 (L)  17 - 63 U/L   Alkaline Phosphatase 113 38 - 126 U/L   Total Bilirubin 1.8 (H) 0.3 - 1.2 mg/dL   GFR calc non Af Amer >60 >60 mL/min   GFR calc Af Amer >60 >60 mL/min   Anion gap 8 5 - 15  Troponin I  Result Value Ref Range   Troponin I 0.09 (HH) <0.03 ng/mL  Ethanol  Result Value Ref Range   Alcohol, Ethyl (B) <5 <5 mg/dL  Protime-INR  Result Value Ref Range   Prothrombin Time 14.1 11.4 - 15.2 seconds   INR 1.08    Ct Head Wo Contrast Result Date: 02/28/2016 CLINICAL DATA:  Posterior neck pain after fall today. No loss of consciousness. EXAM: CT HEAD WITHOUT CONTRAST CT CERVICAL SPINE WITHOUT CONTRAST TECHNIQUE: Multidetector CT imaging of the head and cervical spine was performed following the standard protocol without intravenous contrast. Multiplanar CT image reconstructions of the cervical spine were also generated. COMPARISON:  CT scan of head of January 12, 2012. FINDINGS: CT HEAD FINDINGS Status post left frontal craniotomy. Otherwise bony calvarium  appears intact. Fluid is noted in right mastoid air cells which is unchanged compared to prior exam. Mild diffuse cortical atrophy is noted. Mild chronic ischemic white matter disease is noted. 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 Ventricular size is within normal limits. No midline shift is noted. New focal hyperdensity is noted in right anterior parafalcine region concerning for possible subarachnoid hemorrhage. There is interval development of right frontal subcortical white matter edema concerning for acute infarction or possible neoplasm. MRI is recommended for further evaluation. CT CERVICAL SPINE FINDINGS No fracture or significant spondylolisthesis is noted. Severe degenerative disc disease is noted at C5-6 and C6-7 with anterior osteophyte formation. Degenerative changes seen involving the posterior facet joints bilaterally. Visualized upper lung fields appear normal. IMPRESSION: Mild diffuse cortical  atrophy. Mild chronic ischemic white matter disease. Postsurgical changes seen in left frontal region. There is interval development of right frontal subcortical white matter low density with mass effect concerning for edema ; it is uncertain if this is due to acute infarction or neoplasm. MRI is recommended for further evaluation. Probable right parafalcine subarachnoid hemorrhage is noted as well. Severe multilevel degenerative disc disease is noted. No acute abnormality seen in the cervical spine. Critical Value/emergent results were called by telephone at the time of interpretation on 02/28/2016 at 5:25 pm to Dr. Thurnell Garbe, who verbally acknowledged these results. Electronically Signed   By: Marijo Conception, M.D.   On: 02/28/2016 17:26   Ct Cervical Spine Wo Contrast Result Date: 02/28/2016 CLINICAL DATA:  Posterior neck pain after fall today. No loss of consciousness. EXAM: CT HEAD WITHOUT CONTRAST CT CERVICAL SPINE WITHOUT CONTRAST TECHNIQUE: Multidetector CT imaging of the head and cervical spine was performed following the standard protocol without intravenous contrast. Multiplanar CT image reconstructions of the cervical spine were also generated. COMPARISON:  CT scan of head of January 12, 2012. FINDINGS: CT HEAD FINDINGS Status post left frontal craniotomy. Otherwise bony calvarium appears intact. Fluid is noted in right mastoid air cells which is unchanged compared to prior exam. Mild diffuse cortical atrophy is noted. Mild chronic ischemic white matter disease is noted. 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 Ventricular size is within normal limits. No midline shift is noted. New focal hyperdensity is noted in right anterior parafalcine region concerning for possible subarachnoid hemorrhage. There is interval development of right frontal subcortical white matter edema concerning for acute infarction or possible neoplasm. MRI is recommended for further evaluation. CT CERVICAL  SPINE FINDINGS No fracture or significant spondylolisthesis is noted. Severe degenerative disc disease is noted at C5-6 and C6-7 with anterior osteophyte formation. Degenerative changes seen involving the posterior facet joints bilaterally. Visualized upper lung fields appear normal. IMPRESSION: Mild diffuse cortical atrophy. Mild chronic ischemic white matter disease. Postsurgical changes seen in left frontal region. There is interval development of right frontal subcortical white matter low density with mass effect concerning for edema ; it is uncertain if this is due to acute infarction or neoplasm. MRI is recommended for further evaluation. Probable right parafalcine subarachnoid hemorrhage is noted as well. Severe multilevel degenerative disc disease is noted. No acute abnormality seen in the cervical spine. Critical Value/emergent results were called by telephone at the time of interpretation on 02/28/2016 at 5:25 pm to Dr. Thurnell Garbe, who verbally acknowledged these results. Electronically Signed   By: Marijo Conception, M.D.   On: 02/28/2016 17:26   Dg Pelvis Portable Result Date: 02/28/2016 CLINICAL DATA:  Fall.  Dysphasia. EXAM:  PORTABLE PELVIS 1-2 VIEWS COMPARISON:  01/08/2016 FINDINGS: Both hips appear located. There is no displaced fracture identified. Mild degenerative changes are noted involving both hips. IMPRESSION: 1. No acute findings. 2. If there is high clinical suspicion for occult fracture or the patient refuses to weightbear, consider further evaluation with MRI. Although CT is expeditious, evidence is lacking regarding accuracy of CT over plain film radiography. Electronically Signed   By: Kerby Moors M.D.   On: 02/28/2016 15:16   Dg Chest Portable 1 View Result Date: 02/28/2016 CLINICAL DATA:  Aphasia. EXAM: PORTABLE CHEST 1 VIEW COMPARISON:  Radiographs of August 27, 2015. FINDINGS: The heart size and mediastinal contours are within normal limits. Both lungs are clear. Aortic atherosclerosis  is noted. Right-sided Port-A-Cath and left-sided pacemaker are unchanged in position. No pneumothorax or pleural effusion is noted. The visualized skeletal structures are unremarkable. IMPRESSION: No acute cardiopulmonary abnormality seen.  Aortic atherosclerosis. Electronically Signed   By: Marijo Conception, M.D.   On: 02/28/2016 15:16      Francine Graven, DO 02/28/16 1842

## 2016-02-28 NOTE — Consult Note (Signed)
Admission H&P    Chief Complaint: Abnormal brain CT and new-onset seizures.  HPI: [name] is an 82 y.o. male a remote history of stroke, pacemaker implantation, hypertension, hyperlipidemia, diabetes mellitus, atrial fibrillation and coronary artery disease brought to the emergency room following an episode of unconsciousness at home. He had been seen normal 1 hour prior to being found unconscious by his daughter. He was confused on waking up. He does not remember losing consciousness. He said recent falls as well. Patient had a witnessed seizure in the emergency room. IV Keppra load was recommended. CT scan of his head showed a low-density right frontal subcortical white matter lesion concerning for edema. Acute infarction or neoplasm could not be ruled out. There was also a high density lesion in the right frontal parafalcine region. Significance is unclear. MRI is not been obtained because patient has a cardiac pacemaker. Repeat CT scan of the head with contrast is pending.  Past Medical History:  Diagnosis Date  . Allergy   . Arthritis    degenerative-knees  . Atrial fibrillation ([place]) 1990  . Bladder cancer ([place]) 2010  . Cancer ([place]) 05/05/07-06/15/07   parotid gland/66.4 GY  . Cancer ([place])    left eyelid   . Chronic kidney disease    positive cytology  . Coronary artery disease   . Diabetes mellitus without complication ([place])    meds d/c  2-3 year ago  . Dry eyes   . Dyslipidemia   . Dysrhythmia    HX A FIB  . GERD (gastroesophageal reflux disease)   . Heart murmur    2/6 systolic  . History of chemotherapy     Hx carboplatin/25f  . History of radiation therapy 05/05/07-06/15/07   right parotid through subclavicular region/ mid jugularlymph node chain  . History of radiation therapy 05/05/07-06/15/07   Right parotid/hemineck,supraclavicular  . HOH (hard of hearing)   . Hypertension    labile  . Pacemaker    2003 vent paced , rate 69  . Pneumonia    hx of   .  Prostate hypertrophy   . Right groin hernia   . Skin cancer    HX BASAL CELL REMOVED  . Stroke ([facility] 2003   TIA  . Tinnitus    right   . Urinary tract infection    hx of   . Xerostomia    limited    Past Surgical History:  Procedure Laterality Date  . BACK SURGERY    . CARDIAC CATHETERIZATION  09/20/2012   EF 50-55%, moderately calcified aortic valve leaflets, mild-moderate aortic valve stenosis, LA-appendage moderately dilated, moderate regurg of the mitral valve, RA moderately dilated  . CARDIOVASCULAR STRESS TEST  09/05/2009   Pharmalogical stress test w/o chest pain or EKG changes for ischemia  . CRANIOTOMY  12/14/2011   Procedure: CRANIOTOMY HEMATOMA EVACUATION SUBDURAL;  Surgeon: JOphelia Charter MD;  [facility];  Service: Neurosurgery;  Laterality: Left;  LEFT Craniotomy for subdural   . CYSTOSCOPY  01/2011   neg  . CYSTOSCOPY W/ RETROGRADES  04/01/2012   Procedure: CYSTOSCOPY WITH RETROGRADE PYELOGRAM;  Surgeon: MClaybon Jabs MD;  [facility]  Service: Urology;  Laterality: Bilateral;  . CYSTOSCOPY WITH BIOPSY  04/01/2012   Procedure: CYSTOSCOPY WITH BIOPSY;  Surgeon: MClaybon Jabs MD;  [facility]  Service: Urology;  Laterality: N/A;  BLADDER BIOPSIES and bladder washings  . CYSTOSCOPY WITH BIOPSY  05/02/2012   Procedure: CYSTOSCOPY  WITH BIOPSY;  Surgeon: Claybon Jabs, MD;  Location[facility];  Service: Urology;  Laterality: N/A;  . CYSTOSCOPY WITH STENT PLACEMENT  05/02/2012   Procedure: CYSTOSCOPY WITH STENT PLACEMENT;  Surgeon: Claybon Jabs, MD;  Location: [facility];  Service: Urology;  Laterality: Bilateral;  . CYSTOSCOPY WITH URETEROSCOPY AND STENT PLACEMENT Right 02/15/2015   Procedure: CYSTOSCOPY WITH RIGHT URETEROSCOPY, RENAL PELVIC WASHINGS, STENT, RETROGRADE;  Surgeon: Kathie Rhodes, MD;  Location: [facility];  Service: Urology;  Laterality: Right;  .  CYSTOSCOPY/RETROGRADE/URETEROSCOPY  05/02/2012   Procedure: CYSTOSCOPY/RETROGRADE/URETEROSCOPY;  Surgeon: Claybon Jabs, MD;  Location[facility];  Service: Urology;  Laterality: Bilateral;  CYSTOSCOPY BILATERAL RETROGRADE PYLOGRAM BILATERAL URETEROSCOPY AND BIOPSY    . CYSTOSCOPY/RETROGRADE/URETEROSCOPY Bilateral 01/21/2015   Procedure: CYSTOSCOPY/RETROGRADE/ BLADDER BIOPSY;  Surgeon: Kathie Rhodes, MD;  Location: [facility];  Service: Urology;  Laterality: Bilateral;  . LOWER EXTREMITY ARTERIAL DOPPLER  09/28/2011   No evidence of arterial insufficiency.   . [place]  . PACEMAKER GENERATOR CHANGE  05/23/2002   [facility] [place], [place], serial [phone]  . PACEMAKER GENERATOR CHANGE N/A 09/01/2013   Procedure: PACEMAKER GENERATOR CHANGE;  Surgeon: Sanda Klein, MD;  Location: [place] [facility];  Service: Cardiovascular;  Laterality: N/A;  . [place]   last changed 2003  . PAROTIDECTOMY     right  . URETEROSCOPY  04/01/2012   Procedure: URETEROSCOPY;  Surgeon: Claybon Jabs, MD;  Location: [facility];  Service: Urology;  Laterality: Right;    Family History  Problem Relation Age of Onset  . Heart disease Mother   . Cancer Father     Lung cancer  . Emphysema Brother   . Coronary artery disease Son    Social History:  reports that he has never smoked. He has never used smokeless tobacco. He reports that he drinks about 10.5 oz of alcohol per week . He reports that he does not use drugs.  Allergies:  Allergies  Allergen Reactions  . Codeine Other (See Comments)    Blisters between fingers  . Docetaxel Rash    Medications: Preadmission medications were reviewed by me.  ROS: History obtained from the patient  General ROS: negative for - chills, fatigue, fever, night sweats, weight gain or weight loss Psychological ROS: negative for - behavioral disorder, hallucinations, memory difficulties, mood swings or  suicidal ideation Ophthalmic ROS: negative for - blurry vision, double vision, eye pain or loss of vision ENT ROS: negative for - epistaxis, nasal discharge, oral lesions, sore throat, tinnitus or vertigo Allergy and Immunology ROS: negative for - hives or itchy/watery eyes Hematological and Lymphatic ROS: negative for - bleeding problems, bruising or swollen lymph nodes Endocrine ROS: negative for - galactorrhea, hair pattern changes, polydipsia/polyuria or temperature intolerance Respiratory ROS: negative for - cough, hemoptysis, shortness of breath or wheezing Cardiovascular ROS: negative for - chest pain, dyspnea on exertion, edema or irregular heartbeat Gastrointestinal ROS: negative for - abdominal pain, diarrhea, hematemesis, nausea/vomiting or stool incontinence Genito-Urinary ROS: negative for - dysuria, hematuria, incontinence or urinary frequency/urgency Musculoskeletal ROS: Chronic knee pain with degenerative joint disease Neurological ROS: as noted in HPI Dermatological ROS: negative for rash and skin lesion changes  Physical Examination: Blood pressure 184/82, pulse 70, temperature 98.4 F (36.9 C), temperature source Oral, resp. rate 18, height 5' 9"  (1.753 m), weight 86.2 kg (190 lb), SpO2 98 %.  HEENT-  Normocephalic, no lesions, without obvious abnormality.  Normal external eye and conjunctiva.  Normal TM's bilaterally.  Normal auditory canals and external ears. Normal external nose, mucus membranes and septum.  Normal pharynx. Neck supple with no masses, nodes, nodules or enlargement. Cardiovascular - regular rate and rhythm, S1, S2 normal, no murmur, click, rub or gallop Lungs - chest clear, no wheezing, rales, normal symmetric air entry Abdomen - soft, non-tender; bowel sounds normal; no masses,  no organomegaly Extremities - no joint deformities, effusion, or inflammation  Neurologic Examination: Prior 2 witnessed seizure in ED Mental Status: Alert, oriented to time  and place, no acute distress.  Speech fluent without evidence of aphasia. Able to follow commands without difficulty. Cranial Nerves: II-Visual fields were normal. III/IV/VI-Pupils were equal and reacted normally to light. Extraocular movements were full and conjugate.    V/VII-no facial numbness and no facial weakness. VIII-moderate loss of hearing. X-normal speech and symmetrical palatal movement. XI: trapezius strength/neck flexion strength normal bilaterally XII-midline tongue extension with normal strength. Motor: 5/5 bilaterally with normal tone and bulk Sensory: Normal throughout. Deep Tendon Reflexes: Trace to 1+ and symmetric. Plantars: Flexor bilaterally Cerebellar: Normal finger-to-nose testing. Carotid auscultation: Normal  Results for orders placed or performed during the hospital encounter of 02/28/16 (from the past 48 hour(s))  CBC with Differential/Platelet     Status: Abnormal   Collection Time: 02/28/16  4:30 PM  Result Value Ref Range   WBC 9.0 4.0 - 10.5 K/uL   RBC 3.46 (L) 4.22 - 5.81 MIL/uL   Hemoglobin 13.2 13.0 - 17.0 g/dL   HCT 37.9 (L) 39.0 - 52.0 %   MCV 109.5 (H) 78.0 - 100.0 fL   MCH 38.2 (H) 26.0 - 34.0 pg   MCHC 34.8 30.0 - 36.0 g/dL   RDW 12.8 11.5 - 15.5 %   Platelets 109 (L) 150 - 400 K/uL    Comment: REPEATED TO VERIFY SPECIMEN CHECKED FOR CLOTS PLATELET COUNT CONFIRMED BY SMEAR    Neutrophils Relative % 91 %   Neutro Abs 8.1 (H) 1.7 - 7.7 K/uL   Lymphocytes Relative 5 %   Lymphs Abs 0.4 (L) 0.7 - 4.0 K/uL   Monocytes Relative 4 %   Monocytes Absolute 0.4 0.1 - 1.0 K/uL   Eosinophils Relative 0 %   Eosinophils Absolute 0.0 0.0 - 0.7 K/uL   Basophils Relative 0 %   Basophils Absolute 0.0 0.0 - 0.1 K/uL  Comprehensive metabolic panel     Status: Abnormal   Collection Time: 02/28/16  4:30 PM  Result Value Ref Range   Sodium 134 (L) 135 - 145 mmol/L   Potassium 3.2 (L) 3.5 - 5.1 mmol/L   Chloride 97 (L) 101 - 111 mmol/L   CO2 29 22 - 32  mmol/L   Glucose, Bld 187 (H) 65 - 99 mg/dL   BUN 11 6 - 20 mg/dL   Creatinine, Ser 1.03 0.61 - 1.24 mg/dL   Calcium 8.8 (L) 8.9 - 10.3 mg/dL   Total Protein 7.3 6.5 - 8.1 g/dL   Albumin 3.4 (L) 3.5 - 5.0 g/dL   AST 29 15 - 41 U/L   ALT 12 (L) 17 - 63 U/L   Alkaline Phosphatase 113 38 - 126 U/L   Total Bilirubin 1.8 (H) 0.3 - 1.2 mg/dL   GFR calc non Af Amer >60 >60 mL/min   GFR calc Af Amer >60 >60 mL/min    Comment: (NOTE) The eGFR has been calculated using the CKD EPI equation. This calculation has not been validated in  all clinical situations. eGFR's persistently <60 mL/min signify possible Chronic Kidney Disease.    Anion gap 8 5 - 15  Troponin I     Status: Abnormal   Collection Time: 02/28/16  4:30 PM  Result Value Ref Range   Troponin I 0.09 (HH) <0.03 ng/mL    Comment: CRITICAL RESULT CALLED TO, READ BACK BY AND VERIFIED WITH: M [facility] 1724 02/28/2016 WBOND   Ethanol     Status: None   Collection Time: 02/28/16  4:30 PM  Result Value Ref Range   Alcohol, Ethyl (B) <5 <5 mg/dL    Comment:        LOWEST DETECTABLE LIMIT FOR SERUM ALCOHOL IS 5 mg/dL FOR MEDICAL PURPOSES ONLY   Protime-INR     Status: None   Collection Time: 02/28/16  4:30 PM  Result Value Ref Range   Prothrombin Time 14.1 11.4 - 15.2 seconds   INR 1.08    Ct Head Wo Contrast  Result Date: 02/28/2016 CLINICAL DATA:  Posterior neck pain after fall today. No loss of consciousness. EXAM: CT HEAD WITHOUT CONTRAST CT CERVICAL SPINE WITHOUT CONTRAST TECHNIQUE: Multidetector CT imaging of the head and cervical spine was performed following the standard protocol without intravenous contrast. Multiplanar CT image reconstructions of the cervical spine were also generated. COMPARISON:  CT scan of head of January 12, 2012. FINDINGS: CT HEAD FINDINGS Status post left frontal craniotomy. Otherwise bony calvarium appears intact. Fluid is noted in right mastoid air cells which is unchanged compared to prior exam. Mild  diffuse cortical atrophy is noted. Mild chronic ischemic white matter disease is noted. 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 Ventricular size is within normal limits. No midline shift is noted. New focal hyperdensity is noted in right anterior parafalcine region concerning for possible subarachnoid hemorrhage. There is interval development of right frontal subcortical white matter edema concerning for acute infarction or possible neoplasm. MRI is recommended for further evaluation. CT CERVICAL SPINE FINDINGS No fracture or significant spondylolisthesis is noted. Severe degenerative disc disease is noted at C5-6 and C6-7 with anterior osteophyte formation. Degenerative changes seen involving the posterior facet joints bilaterally. Visualized upper lung fields appear normal. IMPRESSION: Mild diffuse cortical atrophy. Mild chronic ischemic white matter disease. Postsurgical changes seen in left frontal region. There is interval development of right frontal subcortical white matter low density with mass effect concerning for edema ; it is uncertain if this is due to acute infarction or neoplasm. MRI is recommended for further evaluation. Probable right parafalcine subarachnoid hemorrhage is noted as well. Severe multilevel degenerative disc disease is noted. No acute abnormality seen in the cervical spine. Critical Value/emergent results were called by telephone at the time of interpretation on 02/28/2016 at 5:25 pm to Dr. Thurnell Garbe, who verbally acknowledged these results. Electronically Signed   By: Marijo Conception, M.D.   On: 02/28/2016 17:26   Ct Cervical Spine Wo Contrast  Result Date: 02/28/2016 CLINICAL DATA:  Posterior neck pain after fall today. No loss of consciousness. EXAM: CT HEAD WITHOUT CONTRAST CT CERVICAL SPINE WITHOUT CONTRAST TECHNIQUE: Multidetector CT imaging of the head and cervical spine was performed following the standard protocol without intravenous contrast.  Multiplanar CT image reconstructions of the cervical spine were also generated. COMPARISON:  CT scan of head of January 12, 2012. FINDINGS: CT HEAD FINDINGS Status post left frontal craniotomy. Otherwise bony calvarium appears intact. Fluid is noted in right mastoid air cells which is unchanged compared to prior exam. Mild  diffuse cortical atrophy is noted. Mild chronic ischemic white matter disease is noted. 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 Ventricular size is within normal limits. No midline shift is noted. New focal hyperdensity is noted in right anterior parafalcine region concerning for possible subarachnoid hemorrhage. There is interval development of right frontal subcortical white matter edema concerning for acute infarction or possible neoplasm. MRI is recommended for further evaluation. CT CERVICAL SPINE FINDINGS No fracture or significant spondylolisthesis is noted. Severe degenerative disc disease is noted at C5-6 and C6-7 with anterior osteophyte formation. Degenerative changes seen involving the posterior facet joints bilaterally. Visualized upper lung fields appear normal. IMPRESSION: Mild diffuse cortical atrophy. Mild chronic ischemic white matter disease. Postsurgical changes seen in left frontal region. There is interval development of right frontal subcortical white matter low density with mass effect concerning for edema ; it is uncertain if this is due to acute infarction or neoplasm. MRI is recommended for further evaluation. Probable right parafalcine subarachnoid hemorrhage is noted as well. Severe multilevel degenerative disc disease is noted. No acute abnormality seen in the cervical spine. Critical Value/emergent results were called by telephone at the time of interpretation on 02/28/2016 at 5:25 pm to Dr. Thurnell Garbe, who verbally acknowledged these results. Electronically Signed   By: Marijo Conception, M.D.   On: 02/28/2016 17:26   Dg Pelvis  Portable  Result Date: 02/28/2016 CLINICAL DATA:  Fall.  Dysphasia. EXAM: PORTABLE PELVIS 1-2 VIEWS COMPARISON:  01/08/2016 FINDINGS: Both hips appear located. There is no displaced fracture identified. Mild degenerative changes are noted involving both hips. IMPRESSION: 1. No acute findings. 2. If there is high clinical suspicion for occult fracture or the patient refuses to weightbear, consider further evaluation with MRI. Although CT is expeditious, evidence is lacking regarding accuracy of CT over plain film radiography. Electronically Signed   By: Kerby Moors M.D.   On: 02/28/2016 15:16   Dg Chest Portable 1 View  Result Date: 02/28/2016 CLINICAL DATA:  Aphasia. EXAM: PORTABLE CHEST 1 VIEW COMPARISON:  Radiographs of August 27, 2015. FINDINGS: The heart size and mediastinal contours are within normal limits. Both lungs are clear. Aortic atherosclerosis is noted. Right-sided Port-A-Cath and left-sided pacemaker are unchanged in position. No pneumothorax or pleural effusion is noted. The visualized skeletal structures are unremarkable. IMPRESSION: No acute cardiopulmonary abnormality seen.  Aortic atherosclerosis. Electronically Signed   By: Marijo Conception, M.D.   On: 02/28/2016 15:16    Assessment/Plan 82 year old man with multiple medical issues as well as history of previous stroke presenting with new onset seizures and abnormal CT of the brain, as described above, most likely a right frontal mass lesion. Infarction cannot be completely ruled out at this point, however.  Recommendations: 1. Continue Keppra 500 mg every 12 hours 2. CT scan of the head with contrast 3. MRI of the brain without and with contrast if possible with patient's implanted pacemaker 4. EEG, routine adult study 5. Further management recommendations following additional brain imaging studies as recommended above  We will continue to follow this patient with you.  C.R. Nicole Kindred, MD [facility]  02/28/2016,  8:41 PM

## 2016-02-28 NOTE — ED Notes (Signed)
Troponin 0.09. MD notified. 

## 2016-02-28 NOTE — ED Triage Notes (Signed)
Pt. Fell on Saturday and family sts he suffered some aphasia. was found down today on the ground unable to speak, and daughter sts he was breathing heavy and his tongue seemed bigger than normal. Pt. Complaining of pain in his mid back. Has his 4th pacemaker in and a power port in upper R chest. Currently being treated for kidney cancer.

## 2016-02-28 NOTE — ED Notes (Signed)
Attempted to call report

## 2016-02-28 NOTE — ED Provider Notes (Signed)
[facility] DEPT Provider Note   CSN: [id number] Arrival date & time: 02/28/16  1420     History   Chief Complaint Chief Complaint  Patient presents with  . Fall    HPI [name] is a 82 y.o. male.  Level V caveat for mental status change. Patient brought in by EMS after having a fall versus syncopal episode today. His daughter states he was sitting at the kitchen table and she left for one hour and when she returned patient was on the ground. He does not know how he got there. He states "everything became dark". He initially was confused but is now back to baseline. He was incontinent of urine and did bite his tongue. Daughter reports that he had an episode 6 days ago where he fell and hit his head. Several days after this he had some difficulty speaking for about 10 minutes but this since resolved. No history of seizures. Patient with a pacemaker as well as a port for history of kidney cancer. He is not getting any Chemotherapy at this time. No fever. He is to drink about or mixed drinks a day last drink was yesterday. He complains of mid back pain that is new since the fall. No focal weakness, numbness or tingling. He is not on any blood thinners.   The history is provided by the patient and a relative. The history is limited by the condition of the patient.  Fall     Past Medical History:  Diagnosis Date  . Allergy   . Arthritis    degenerative-knees  . Atrial fibrillation ([place]) 1990  . Bladder cancer ([place]) 2010  . Cancer ([place]) 05/05/07-06/15/07   parotid gland/66.4 GY  . Cancer ([place])    left eyelid   . Chronic kidney disease    positive cytology  . Coronary artery disease   . Diabetes mellitus without complication ([place])    meds d/c  2-3 year ago  . Dry eyes   . Dyslipidemia   . Dysrhythmia    HX A FIB  . GERD (gastroesophageal reflux disease)   . Heart murmur    2/6 systolic  . History of chemotherapy     Hx carboplatin/73fu  . History of radiation  therapy 05/05/07-06/15/07   right parotid through subclavicular region/ mid jugularlymph node chain  . History of radiation therapy 05/05/07-06/15/07   Right parotid/hemineck,supraclavicular  . HOH (hard of hearing)   . Hypertension    labile  . Pacemaker    2003 vent paced , rate 69  . Pneumonia    hx of   . Prostate hypertrophy   . Right groin hernia   . Skin cancer    HX BASAL CELL REMOVED  . Stroke [facility]) 2003   TIA  . Tinnitus    right   . Urinary tract infection    hx of   . Xerostomia    limited    Patient Active Problem List   Diagnosis Date Noted  . Port catheter in place 02/21/2016  . Neutropenic fever ([place])   . Malnutrition of moderate degree 08/28/2015  . Diet-controlled diabetes mellitus ([place])   . Coronary artery disease 08/27/2015  . Thrombocytopenia ([place]) 08/27/2015  . Hematuria 08/27/2015  . Pancytopenia ([place]) 08/27/2015  . Sepsis ([place])   . Cancer of renal pelvis ([place]) 07/30/2015  . Chest pain 07/17/2014  . Aortic stenosis 04/29/2013  . Thrombocytosis ([place]) 03/30/2013  . Abnormal urine cytology 05/02/2012  . Acute pulmonary edema ([name]  10/04/2011  . Alcohol withdrawal delirium ([place]) 10/04/2011  . TIA (transient ischemic attack) 10/03/2011  . Subdural hematoma ([place]) 10/02/2011  . ETOH abuse 10/02/2011  . Permanent atrial fibrillation ([place]) 10/02/2011  . UTI (lower urinary tract infection) 10/02/2011  . Elevated PSA 05/19/2011  . Pacemaker 05/19/2011  . Diabetes mellitus ([place]) 05/19/2011  . Hypertension 05/19/2011  . Dyslipidemia 05/19/2011    Past Surgical History:  Procedure Laterality Date  . BACK SURGERY    . CARDIAC CATHETERIZATION  09/20/2012   EF 50-55%, moderately calcified aortic valve leaflets, mild-moderate aortic valve stenosis, LA-appendage moderately dilated, moderate regurg of the mitral valve, RA moderately dilated  . CARDIOVASCULAR STRESS TEST  09/05/2009   Pharmalogical stress test w/o chest pain or EKG changes for ischemia  .  CRANIOTOMY  12/14/2011   Procedure: CRANIOTOMY HEMATOMA EVACUATION SUBDURAL;  Surgeon: Ophelia Charter, MD;  Location: [facility];  Service: Neurosurgery;  Laterality: Left;  LEFT Craniotomy for subdural   . CYSTOSCOPY  01/2011   neg  . CYSTOSCOPY W/ RETROGRADES  04/01/2012   Procedure: CYSTOSCOPY WITH RETROGRADE PYELOGRAM;  Surgeon: Claybon Jabs, MD;  Location: [facility];  Service: Urology;  Laterality: Bilateral;  . CYSTOSCOPY WITH BIOPSY  04/01/2012   Procedure: CYSTOSCOPY WITH BIOPSY;  Surgeon: Claybon Jabs, MD;  Location: [facility];  Service: Urology;  Laterality: N/A;  BLADDER BIOPSIES and bladder washings  . CYSTOSCOPY WITH BIOPSY  05/02/2012   Procedure: CYSTOSCOPY WITH BIOPSY;  Surgeon: Claybon Jabs, MD;  Location: [facility];  Service: Urology;  Laterality: N/A;  . CYSTOSCOPY WITH STENT PLACEMENT  05/02/2012   Procedure: CYSTOSCOPY WITH STENT PLACEMENT;  Surgeon: Claybon Jabs, MD;  Location: [facility];  Service: Urology;  Laterality: Bilateral;  . CYSTOSCOPY WITH URETEROSCOPY AND STENT PLACEMENT Right 02/15/2015   Procedure: CYSTOSCOPY WITH RIGHT URETEROSCOPY, RENAL PELVIC WASHINGS, STENT, RETROGRADE;  Surgeon: Kathie Rhodes, MD;  Location: [facility];  Service: Urology;  Laterality: Right;  . CYSTOSCOPY/RETROGRADE/URETEROSCOPY  05/02/2012   Procedure: CYSTOSCOPY/RETROGRADE/URETEROSCOPY;  Surgeon: Claybon Jabs, MD;  Location[facility];  Service: Urology;  Laterality: Bilateral;  CYSTOSCOPY BILATERAL RETROGRADE PYLOGRAM BILATERAL URETEROSCOPY AND BIOPSY    . CYSTOSCOPY/RETROGRADE/URETEROSCOPY Bilateral 01/21/2015   Procedure: CYSTOSCOPY/RETROGRADE/ BLADDER BIOPSY;  Surgeon: Kathie Rhodes, MD;  Location: [facility];  Service: Urology;  Laterality: Bilateral;  . LOWER EXTREMITY ARTERIAL DOPPLER  09/28/2011   No evidence of arterial insufficiency.   . [place]  . PACEMAKER GENERATOR  CHANGE  05/23/2002   [facility] [place], [place], serial [phone]  . PACEMAKER GENERATOR CHANGE N/A 09/01/2013   Procedure: PACEMAKER GENERATOR CHANGE;  Surgeon: Sanda Klein, MD;  Location: [facility];  Service: Cardiovascular;  Laterality: N/A;  . [place]   last changed 2003  . PAROTIDECTOMY     right  . URETEROSCOPY  04/01/2012   Procedure: URETEROSCOPY;  Surgeon: Claybon Jabs, MD;  Location: [facility];  Service: Urology;  Laterality: Right;       Home Medications    Prior to Admission medications   Medication Sig Start Date End Date Taking? Authorizing Provider  acetaminophen (TYLENOL) 325 MG tablet Take 1-2 tablets (325-650 mg total) by mouth every 4 (four) hours as needed for mild pain. Patient not taking: Reported on 01/10/2016 09/02/13   Lendon Colonel, NP  clopidogrel (PLAVIX) 75 MG tablet Take 75 mg by mouth daily. For Afib/TIA  Historical Provider, MD  docusate sodium (COLACE) 100 MG capsule Take 1 capsule (100 mg total) by mouth 2 (two) times daily. 09/03/15   Barton Dubois, MD  ferrous sulfate 325 (65 FE) MG tablet Take 325 mg by mouth daily with breakfast. Reported on 01/10/2016    Historical Provider, MD  mineral oil-hydrophilic petrolatum (AQUAPHOR) ointment Apply topically 2 (two) times daily. Patient not taking: Reported on 01/10/2016 09/09/15   Nelda Bucks Ngetich, NP  oxybutynin (DITROPAN) 5 MG tablet Take 1 tablet (5 mg total) by mouth every 8 (eight) hours as needed for bladder spasms. Patient not taking: Reported on 01/10/2016 09/03/15   Barton Dubois, MD  pantoprazole (PROTONIX) 40 MG tablet Take 40 mg by mouth daily. For GERD.    Historical Provider, MD  polyethylene glycol (MIRALAX / GLYCOLAX) packet Take 17 g by mouth daily. 09/03/15   Barton Dubois, MD  prochlorperazine (COMPAZINE) 10 MG tablet Take 10 mg by mouth every 6 (six) hours as needed for nausea or vomiting. Reported on 01/10/2016    Historical Provider, MD    UNABLE TO FIND Med Name: Med pass 120 mL 3 times daily between meals as a supplement    Historical Provider, MD    Family History Family History  Problem Relation Age of Onset  . Heart disease Mother   . Cancer Father     Lung cancer  . Emphysema Brother   . Coronary artery disease Son     Social History Social History  Substance Use Topics  . Smoking status: Never Smoker  . Smokeless tobacco: Never Used  . Alcohol use 10.5 oz/week    21 Standard drinks or equivalent per week     Comment: 3 /day( bourbon)     Allergies   Codeine and Docetaxel   Review of Systems Review of Systems  Unable to perform ROS: Mental status change     Physical Exam Updated Vital Signs BP 140/64   Pulse 81   Temp 98.4 F (36.9 C) (Oral)   Resp 14   Ht 5\' 9"  (1.753 m)   Wt 190 lb (86.2 kg)   SpO2 97%   BMI 28.06 kg/m   Physical Exam  Constitutional: He is oriented to person, place, and time. He appears well-developed and well-nourished. No distress.  disheveled  HENT:  Head: Normocephalic and atraumatic.  Right Ear: External ear normal.  Left Ear: External ear normal.  Mouth/Throat: Oropharynx is clear and moist.  Abrasions to tongue  Eyes: Conjunctivae and EOM are normal. Pupils are equal, round, and reactive to light.  Neck: Normal range of motion.  No C spine tenderness  Cardiovascular: Normal rate and normal heart sounds.   No murmur heard. Irregular rhythm  Pulmonary/Chest: Effort normal. No respiratory distress. He exhibits no tenderness.  Pacemaker L chest  Abdominal: Soft. There is no tenderness. No hernia.  Genitourinary:  Genitourinary Comments: Incontinent of urine.  Musculoskeletal: He exhibits no edema.  FROM hips without pain TTP thoracic spine without stepoffs.  Neurological: He is alert and oriented to person, place, and time. No cranial nerve deficit. Coordination normal.  5.5 strength throughout  Skin:  Ecchymosis to arms and legs in various stages  of healing.     ED Treatments / Results  Labs (all labs ordered are listed, but only abnormal results are displayed) Labs Reviewed  CBC WITH DIFFERENTIAL/PLATELET - Abnormal; Notable for the following:       Result Value   RBC 3.46 (*)    HCT  37.9 (*)    MCV 109.5 (*)    MCH 38.2 (*)    Platelets 109 (*)    Neutro Abs 8.1 (*)    Lymphs Abs 0.4 (*)    All other components within normal limits  ETHANOL  PROTIME-INR  COMPREHENSIVE METABOLIC PANEL  TROPONIN I  URINE RAPID DRUG SCREEN, HOSP PERFORMED  URINALYSIS, ROUTINE W REFLEX MICROSCOPIC (NOT AT [facility])    EKG  EKG Interpretation  Date/Time:  Friday February 28 2016 14:21:00 EDT Ventricular Rate:  80 PR Interval:    QRS Duration: 96 QT Interval:  475 QTC Calculation: 548 R Axis:   68 Text Interpretation:  Atrial fibrillation Paired ventricular premature complexes Repol abnrm, prob ischemia, anterolateral lds Prolonged QT interval inferior and lateral ST depressions and T wave inversions similar to 2013 no longer paced. Confirmed by Wyvonnia Dusky  MD, [place] [phone]) on 02/28/2016 3:31:43 PM       Radiology Dg Pelvis Portable  Result Date: 02/28/2016 CLINICAL DATA:  Fall.  Dysphasia. EXAM: PORTABLE PELVIS 1-2 VIEWS COMPARISON:  01/08/2016 FINDINGS: Both hips appear located. There is no displaced fracture identified. Mild degenerative changes are noted involving both hips. IMPRESSION: 1. No acute findings. 2. If there is high clinical suspicion for occult fracture or the patient refuses to weightbear, consider further evaluation with MRI. Although CT is expeditious, evidence is lacking regarding accuracy of CT over plain film radiography. Electronically Signed   By: Kerby Moors M.D.   On: 02/28/2016 15:16   Dg Chest Portable 1 View  Result Date: 02/28/2016 CLINICAL DATA:  Aphasia. EXAM: PORTABLE CHEST 1 VIEW COMPARISON:  Radiographs of August 27, 2015. FINDINGS: The heart size and mediastinal contours are within normal limits. Both  lungs are clear. Aortic atherosclerosis is noted. Right-sided Port-A-Cath and left-sided pacemaker are unchanged in position. No pneumothorax or pleural effusion is noted. The visualized skeletal structures are unremarkable. IMPRESSION: No acute cardiopulmonary abnormality seen.  Aortic atherosclerosis. Electronically Signed   By: Marijo Conception, M.D.   On: 02/28/2016 15:16    Procedures Procedures (including critical care time)  Medications Ordered in ED Medications - No data to display   Initial Impression / Assessment and Plan / ED Course  I have reviewed the triage vital signs and the nursing notes.  Pertinent labs & imaging results that were available during my care of the patient were reviewed by me and considered in my medical decision making (see chart for details).  Clinical Course   Patient with loss of consciousness, syncope versus possible seizure. Complains of upper back pain. Hx renal cell cancer.  Patient's EKG shows ST depression and T-wave inversions supported 2013 and he is not paced at this time.  Pacemaker interrogation performed and shows no recent abnormalities.  Suspect seizure. Patient with tongue biting and incontinence and initial confusion. He is now back to baseline.  Troponin 0.09.  Denies chest pain or SOB. CT head and C spine pending and remainder of labs pending at time of sign out to Dr. Thurnell Garbe.    Final Clinical Impressions(s) / ED Diagnoses   Final diagnoses:  Loss of consciousness  Seizure ([place])  Abnormal CT of brain  Syncope, unspecified syncope type  Hypokalemia  Thrombocytopenia ([name]  Elevated troponin    New Prescriptions New Prescriptions   No medications on file     Ezequiel Essex, MD 02/28/16 2052

## 2016-02-28 NOTE — H&P (Signed)
History and Physical    [name] [id number] DOB: 1929/12/08 DOA: 02/28/2016  PCP: Gabriel Pao, MD    Patient coming from: Home  Chief Complaint: Passed out at home and found on floor.  HPI: [name] is a 82 y.o. male, single, his daughter and additional historian [name] lives with him, ambulates with the help of a cane and walker, PMH of atrial fibrillation not on anticoagulation due to previous subdural hematoma, pacemaker implanted 1990 for sinus node dysfunction, mild-to-moderate degenerative aortic stenosis, subdural hematoma, diet controlled diabetes, transitional cell carcinoma with intermittent hematuria, TIA, HLD, HTN, presented to [facility] ED on 02/28/16 following an episode of passing out at home and was found on the floor by his daughter. Patient is a poor historian and hence history is obtained from both the patient and his daughter at bedside. 5 days ago, daughter was out smoking and about midnight and when she came in, found the patient sitting next to the kitchen refrigerator. He apparently had sustained a fall. No report of LOC or other details during that event. He had scraped his forearm. In her words, he was "drunk". She was unable to get him to stand up. She activated EMS who brought him up but patient refused to come to the hospital for further evaluation. 2 days later, during the evening hours, he had a brief approximately 5-10 minutes event off garbled speech with he was speaking gibberish and he was aware of this. No other strokelike symptoms at this time. Again he refused to come to the hospital for further evaluation and symptoms resolved spontaneously. On 02/28/16 at approximately 12:30 PM, daughter had gone out to buy some lunch and when she got back, she found him laying on his side on the carpeted floor. He was breathing heavy, eyes were open and looking around but he was incoherent and not responding appropriately to questions. His tongue was bitten and  apparently swollen and he had an episode of urinary incontinence. This was not a witnessed fall. Patient remembers passing out but does not remember any premonitory symptoms. No seizure-like activity noted. EMS was activated and within 15 minutes, he was starting to come around and respond better to questions. Approximately 6-7 hours later, his daughter states that he is almost close to his baseline but still slightly confused. He is hard of hearing in his right ear.    ED Course: In the ED, lab work significant for platelet of 109, potassium 3.2, sodium 132, glucose 187, troponin 0.09 and CT head suggests interval development of right frontal subcortical white matter low density with mass effect concerning for edema, it is uncertain if this is due to acute infarction or neoplasm. Probable right parafalcine subarachnoid hemorrhage is noted as well. No acute abnormality seen on CT cervical spine. Chest x-ray without acute abnormalities. Pelvic x-ray without acute findings. Neurology was consulted by EDP and recommend MRI with and without contrast of the brain and will seen formal consult. Hospitalist admission was requested.   Review of Systems:  All other systems reviewed and apart from HPI, are negative. As per daughter, patient had history of hematuria which had stopped for a couple of months but then has recurred in the last 2 weeks. Questionable intermittent mild dysuria without fever or chills. Patient denies chest pain. No prior history of seizures. He apparently had similar speech like difficulty in the past when he had to have cranial procedure done by Dr. Arnoldo [name].  Past Medical History:  Diagnosis  Date  . Allergy   . Arthritis    degenerative-knees  . Atrial fibrillation ([place]) 1990  . Bladder cancer ([place]) 2010  . Cancer ([place]) 05/05/07-06/15/07   parotid gland/66.4 GY  . Cancer ([place])    left eyelid   . Chronic kidney disease    positive cytology  . Coronary artery disease   . Diabetes  mellitus without complication ([place])    meds d/c  2-3 year ago  . Dry eyes   . Dyslipidemia   . Dysrhythmia    HX A FIB  . GERD (gastroesophageal reflux disease)   . Heart murmur    2/6 systolic  . History of chemotherapy     Hx carboplatin/59fu  . History of radiation therapy 05/05/07-06/15/07   right parotid through subclavicular region/ mid jugularlymph node chain  . History of radiation therapy 05/05/07-06/15/07   Right parotid/hemineck,supraclavicular  . HOH (hard of hearing)   . Hypertension    labile  . Pacemaker    2003 vent paced , rate 69  . Pneumonia    hx of   . Prostate hypertrophy   . Right groin hernia   . Skin cancer    HX BASAL CELL REMOVED  . Stroke [facility]) 2003   TIA  . Tinnitus    right   . Urinary tract infection    hx of   . Xerostomia    limited    Past Surgical History:  Procedure Laterality Date  . BACK SURGERY    . CARDIAC CATHETERIZATION  09/20/2012   EF 50-55%, moderately calcified aortic valve leaflets, mild-moderate aortic valve stenosis, LA-appendage moderately dilated, moderate regurg of the mitral valve, RA moderately dilated  . CARDIOVASCULAR STRESS TEST  09/05/2009   Pharmalogical stress test w/o chest pain or EKG changes for ischemia  . CRANIOTOMY  12/14/2011   Procedure: CRANIOTOMY HEMATOMA EVACUATION SUBDURAL;  Surgeon: Gabriel Charter, MD;  Location: [place] [facility];  Service: Neurosurgery;  Laterality: Left;  LEFT Craniotomy for subdural   . CYSTOSCOPY  01/2011   neg  . CYSTOSCOPY W/ RETROGRADES  04/01/2012   Procedure: CYSTOSCOPY WITH RETROGRADE PYELOGRAM;  Surgeon: Gabriel Jabs, MD;  Location: [facility];  Service: Urology;  Laterality: Bilateral;  . CYSTOSCOPY WITH BIOPSY  04/01/2012   Procedure: CYSTOSCOPY WITH BIOPSY;  Surgeon: Gabriel Jabs, MD;  Location: [facility];  Service: Urology;  Laterality: N/A;  BLADDER BIOPSIES and bladder washings  . CYSTOSCOPY WITH BIOPSY  05/02/2012   Procedure:  CYSTOSCOPY WITH BIOPSY;  Surgeon: Gabriel Jabs, MD;  Location[facility];  Service: Urology;  Laterality: N/A;  . CYSTOSCOPY WITH STENT PLACEMENT  05/02/2012   Procedure: CYSTOSCOPY WITH STENT PLACEMENT;  Surgeon: Gabriel Jabs, MD;  Location[facility];  Service: Urology;  Laterality: Bilateral;  . CYSTOSCOPY WITH URETEROSCOPY AND STENT PLACEMENT Right 02/15/2015   Procedure: CYSTOSCOPY WITH RIGHT URETEROSCOPY, RENAL PELVIC WASHINGS, STENT, RETROGRADE;  Surgeon: Kathie Rhodes, MD;  Location: [facility];  Service: Urology;  Laterality: Right;  . CYSTOSCOPY/RETROGRADE/URETEROSCOPY  05/02/2012   Procedure: CYSTOSCOPY/RETROGRADE/URETEROSCOPY;  Surgeon: Gabriel Jabs, MD;  Location: [facility];  Service: Urology;  Laterality: Bilateral;  CYSTOSCOPY BILATERAL RETROGRADE PYLOGRAM BILATERAL URETEROSCOPY AND BIOPSY    . CYSTOSCOPY/RETROGRADE/URETEROSCOPY Bilateral 01/21/2015   Procedure: CYSTOSCOPY/RETROGRADE/ BLADDER BIOPSY;  Surgeon: Kathie Rhodes, MD;  Location: [facility];  Service: Urology;  Laterality: Bilateral;  . LOWER EXTREMITY ARTERIAL DOPPLER  09/28/2011   No evidence of  arterial insufficiency.   . [place]  . PACEMAKER GENERATOR CHANGE  05/23/2002   [facility] [place], [place], serial [phone]  . PACEMAKER GENERATOR CHANGE N/A 09/01/2013   Procedure: PACEMAKER GENERATOR CHANGE;  Surgeon: Sanda Klein, MD;  Location: [facility];  Service: Cardiovascular;  Laterality: N/A;  . [place]   last changed 2003  . PAROTIDECTOMY     right  . URETEROSCOPY  04/01/2012   Procedure: URETEROSCOPY;  Surgeon: Gabriel Jabs, MD;  Location[facility];  Service: Urology;  Laterality: Right;   Social history   reports that he has never smoked. He has never used smokeless tobacco. He reports that he drinks about 10.5 oz of alcohol per week . He reports that he does not use drugs.  Allergies  Allergen  Reactions  . Codeine Other (See Comments)    Blisters between fingers  . Docetaxel Rash    Family History  Problem Relation Age of Onset  . Heart disease Mother   . Cancer Father     Lung cancer  . Emphysema Brother   . Coronary artery disease Son      Prior to Admission medications   Medication Sig Start Date End Date Taking? Authorizing Provider  acetaminophen (TYLENOL) 500 MG tablet Take 500 mg by mouth daily as needed for mild pain.   Yes Historical Provider, MD  clopidogrel (PLAVIX) 75 MG tablet Take 75 mg by mouth daily. For Afib/TIA   Yes Historical Provider, MD  mineral oil-hydrophilic petrolatum (AQUAPHOR) ointment Apply topically 2 (two) times daily. Patient taking differently: Apply 1 application topically daily.  09/09/15  Yes Dinah C Ngetich, NP  oxybutynin (DITROPAN) 5 MG tablet Take 1 tablet (5 mg total) by mouth every 8 (eight) hours as needed for bladder spasms. 09/03/15  Yes Barton Dubois, MD  pantoprazole (PROTONIX) 40 MG tablet Take 40 mg by mouth daily. For GERD.   Yes Historical Provider, MD  polyethylene glycol (MIRALAX / GLYCOLAX) packet Take 17 g by mouth daily. Patient taking differently: Take 17 g by mouth daily as needed for mild constipation.  09/03/15  Yes Barton Dubois, MD  prochlorperazine (COMPAZINE) 10 MG tablet Take 10 mg by mouth every 6 (six) hours as needed for nausea or vomiting. Reported on 01/10/2016   Yes Historical Provider, MD    Physical Exam: Vitals:   02/28/16 1745 02/28/16 1800 02/28/16 1815 02/28/16 1830  BP: 155/71 161/80 166/80 184/82  Pulse: 74 66 69 70  Resp: 14 11 16 18   Temp:      TempSrc:      SpO2: 99% 97% 99% 98%  Weight:      Height:       Temperature 98.68F.   Constitutional: Moderately built and nourished, unkempt male, chronically ill-looking, lying comfortably supine in the gurney. Family at bedside. Eyes: PERTLA, lids and conjunctivae normal ENMT: Mucous membranes are dry. Posterior pharynx clear of any exudate  or lesions. Normal dentition. Tongue bites on both sides without active bleeding. Extremely hard of hearing in right ear. Neck: normal, supple, no masses, no thyromegaly Respiratory: clear to auscultation bilaterally, no wheezing, no crackles. Normal respiratory effort. No accessory muscle use. Pacemaker box left upper anterior chest. Cardiovascular: S1 & S2 heard, regular rate and rhythm, no murmurs / rubs / gallops. No extremity edema. 2+ pedal pulses. No carotid bruits. Telemetry: Sinus rhythm. Abdomen: No distension, no tenderness, no masses palpated. No hepatosplenomegaly. Bowel sounds normal. Small superficial area  of subacute bruising over right side of waist. Musculoskeletal: no clubbing / cyanosis. No joint deformity upper and lower extremities. Good ROM, no contractures. Normal muscle tone. Extensive bruising at different stages on both upper extremities, some on lower extremities with hyperpigmentation changes of legs. Skin: no rashes, lesions, ulcers. No induration Neurologic: CN 2-12 grossly intact. Sensation intact, DTR normal. Strength 5/5 in all 4 limbs.  Psychiatric: Impaired judgment and insight. Alert and oriented x 3. Normal mood.     Labs on Admission: I have personally reviewed following labs and imaging studies  CBC:  Recent Labs Lab 02/28/16 1630  WBC 9.0  NEUTROABS 8.1*  HGB 13.2  HCT 37.9*  MCV 109.5*  PLT 0000000*   Basic Metabolic Panel:  Recent Labs Lab 02/28/16 1630  NA 134*  K 3.2*  CL 97*  CO2 29  GLUCOSE 187*  BUN 11  CREATININE 1.03  CALCIUM 8.8*   GFR: Estimated Creatinine Clearance: 58.1 mL/min (by C-G formula based on SCr of 1.03 mg/dL). Liver Function Tests:  Recent Labs Lab 02/28/16 1630  AST 29  ALT 12*  ALKPHOS 113  BILITOT 1.8*  PROT 7.3  ALBUMIN 3.4*   No results for input(s): LIPASE, AMYLASE in the last 168 hours. No results for input(s): AMMONIA in the last 168 hours. Coagulation Profile:  Recent Labs Lab  02/28/16 1630  INR 1.08   Cardiac Enzymes:  Recent Labs Lab 02/28/16 1630  TROPONINI 0.09*      Radiological Exams on Admission: Ct Head Wo Contrast  Result Date: 02/28/2016 CLINICAL DATA:  Posterior neck pain after fall today. No loss of consciousness. EXAM: CT HEAD WITHOUT CONTRAST CT CERVICAL SPINE WITHOUT CONTRAST TECHNIQUE: Multidetector CT imaging of the head and cervical spine was performed following the standard protocol without intravenous contrast. Multiplanar CT image reconstructions of the cervical spine were also generated. COMPARISON:  CT scan of head of January 12, 2012. FINDINGS: CT HEAD FINDINGS Status post left frontal craniotomy. Otherwise bony calvarium appears intact. Fluid is noted in right mastoid air cells which is unchanged compared to prior exam. Mild diffuse cortical atrophy is noted. Mild chronic ischemic white matter disease is noted. 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 Ventricular size is within normal limits. No midline shift is noted. New focal hyperdensity is noted in right anterior parafalcine region concerning for possible subarachnoid hemorrhage. There is interval development of right frontal subcortical white matter edema concerning for acute infarction or possible neoplasm. MRI is recommended for further evaluation. CT CERVICAL SPINE FINDINGS No fracture or significant spondylolisthesis is noted. Severe degenerative disc disease is noted at C5-6 and C6-7 with anterior osteophyte formation. Degenerative changes seen involving the posterior facet joints bilaterally. Visualized upper lung fields appear normal. IMPRESSION: Mild diffuse cortical atrophy. Mild chronic ischemic white matter disease. Postsurgical changes seen in left frontal region. There is interval development of right frontal subcortical white matter low density with mass effect concerning for edema ; it is uncertain if this is due to acute infarction or neoplasm. MRI is  recommended for further evaluation. Probable right parafalcine subarachnoid hemorrhage is noted as well. Severe multilevel degenerative disc disease is noted. No acute abnormality seen in the cervical spine. Critical Value/emergent results were called by telephone at the time of interpretation on 02/28/2016 at 5:25 pm to Dr. Thurnell Garbe, who verbally acknowledged these results. Electronically Signed   By: Marijo Conception, M.D.   On: 02/28/2016 17:26   Ct Cervical Spine Wo Contrast  Result  Date: 02/28/2016 CLINICAL DATA:  Posterior neck pain after fall today. No loss of consciousness. EXAM: CT HEAD WITHOUT CONTRAST CT CERVICAL SPINE WITHOUT CONTRAST TECHNIQUE: Multidetector CT imaging of the head and cervical spine was performed following the standard protocol without intravenous contrast. Multiplanar CT image reconstructions of the cervical spine were also generated. COMPARISON:  CT scan of head of January 12, 2012. FINDINGS: CT HEAD FINDINGS Status post left frontal craniotomy. Otherwise bony calvarium appears intact. Fluid is noted in right mastoid air cells which is unchanged compared to prior exam. Mild diffuse cortical atrophy is noted. Mild chronic ischemic white matter disease is noted. 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 Ventricular size is within normal limits. No midline shift is noted. New focal hyperdensity is noted in right anterior parafalcine region concerning for possible subarachnoid hemorrhage. There is interval development of right frontal subcortical white matter edema concerning for acute infarction or possible neoplasm. MRI is recommended for further evaluation. CT CERVICAL SPINE FINDINGS No fracture or significant spondylolisthesis is noted. Severe degenerative disc disease is noted at C5-6 and C6-7 with anterior osteophyte formation. Degenerative changes seen involving the posterior facet joints bilaterally. Visualized upper lung fields appear normal. IMPRESSION:  Mild diffuse cortical atrophy. Mild chronic ischemic white matter disease. Postsurgical changes seen in left frontal region. There is interval development of right frontal subcortical white matter low density with mass effect concerning for edema ; it is uncertain if this is due to acute infarction or neoplasm. MRI is recommended for further evaluation. Probable right parafalcine subarachnoid hemorrhage is noted as well. Severe multilevel degenerative disc disease is noted. No acute abnormality seen in the cervical spine. Critical Value/emergent results were called by telephone at the time of interpretation on 02/28/2016 at 5:25 pm to Dr. Thurnell Garbe, who verbally acknowledged these results. Electronically Signed   By: Marijo Conception, M.D.   On: 02/28/2016 17:26   Dg Pelvis Portable  Result Date: 02/28/2016 CLINICAL DATA:  Fall.  Dysphasia. EXAM: PORTABLE PELVIS 1-2 VIEWS COMPARISON:  01/08/2016 FINDINGS: Both hips appear located. There is no displaced fracture identified. Mild degenerative changes are noted involving both hips. IMPRESSION: 1. No acute findings. 2. If there is high clinical suspicion for occult fracture or the patient refuses to weightbear, consider further evaluation with MRI. Although CT is expeditious, evidence is lacking regarding accuracy of CT over plain film radiography. Electronically Signed   By: Kerby Moors M.D.   On: 02/28/2016 15:16   Dg Chest Portable 1 View  Result Date: 02/28/2016 CLINICAL DATA:  Aphasia. EXAM: PORTABLE CHEST 1 VIEW COMPARISON:  Radiographs of August 27, 2015. FINDINGS: The heart size and mediastinal contours are within normal limits. Both lungs are clear. Aortic atherosclerosis is noted. Right-sided Port-A-Cath and left-sided pacemaker are unchanged in position. No pneumothorax or pleural effusion is noted. The visualized skeletal structures are unremarkable. IMPRESSION: No acute cardiopulmonary abnormality seen.  Aortic atherosclerosis. Electronically Signed   By:  Marijo Conception, M.D.   On: 02/28/2016 15:16    EKG: Independently reviewed. A. fib with controlled ventricular rate and paced rhythm.  Assessment/Plan Principal Problem:   Syncope Active Problems:   Pacemaker   Diabetes mellitus ([place])   Hypertension   Dyslipidemia   ETOH abuse   Permanent atrial fibrillation ([name]   Cancer of renal pelvis ([name]   Coronary artery disease   Thrombocytopenia ([name]   Hematuria     Syncope Vs Seizures, and fall at home - Unclear if this is  a recurrent event considering an event that occurred 5 days prior to admission - Differential diagnosis: Orthostatic hypotension related to dehydration from alcohol abuse and poor oral intake, arrhythmias, seizures, intracranial pathology (note abnormal CT head) - concern for neoplasm versus other causes. - Pacemaker has been evaluated and cleared in ED - Admit to telemetry for close observation and evaluation. Check orthostatic blood pressures. Cycle troponin. Follow-up MRI brain with and without contrast. Check EEG. - PT and OT evaluation.  Dehydration with hyponatremia - Brief IV fluids  Hypokalemia - Replace and check BMP and magnesium  Thrombocytopenia - Seems chronic. Follow CBC's  Elevated troponin - Unclear etiology.? Demand ischemia. No reported chest pain. - Cycle troponin.  Alcohol dependence - Patient apparently drinks 4 mixed drinks daily. Has had prior history of alcohol withdrawal. - CIWA protocol  Permanent atrial fibrillation/pacemaker - Controlled ventricular rate. Not anticoagulation candidate secondary to previous subdural hematoma. Patient on Plavix at home-currently held secondary to concern for subarachnoid hemorrhage. Decision to be made post MRI regarding resuming.  Essential hypertension -Controlled at home off of meds. Monitor without medications for now.  Diet controlled DM - Monitor CBGs and consider SSI if needed.    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 Intermittent hematuria of concern. Outpatient follow-up with oncology. Also of concern is brain metastasis.   DVT prophylaxis: SCD's  Code Status: Full  Family Communication: Discussed extensively with patient's daughter and her friend at bedside.  Disposition Plan: DC home when medically improved.  Consults called: Neurology  Admission status: Observation, tele    [facility] MD [facility] Pager [phone]  If 7PM-7AM, please contact night-coverage [email or web address] Password [facility]  02/28/2016, 7:23 PM

## 2016-02-28 NOTE — ED Provider Notes (Addendum)
2030:  ED RN called me to bedside:  Pt having a seizure, lasted few minutes, +bit tongue, +urinated. Pt positioned on his side, airway suctioned. Pt now post-ictal. T/C to Neuro Dr. Nicole Kindred, case discussed, including:  HPI, pertinent PM/SHx, VS/PE, dx testing, ED course and treatment:  Updated regarding pt condition, requests to start IV keppra 1000mg  now, then 500mg  BID; obtain CT-H with contrast now.  T/C to [facility] Donnal Debar, case discussed, including:  HPI, pertinent PM/SHx, VS/PE, dx testing, ED course and treatment:  Updated regarding pt condition and d/w Neuro MD; requests to upgrade bed to Stepdown. Temp orders placed. Family updated.     MDM Reviewed: nursing note, previous chart and vitals Reviewed previous: labs and ECG Interpretation: labs, ECG, x-ray and CT scan Total time providing critical care: 30-74 minutes. This excludes time spent performing separately reportable procedures and services. Consults: neurology and admitting MD    CRITICAL CARE Performed by: Alfonzo Feller Total critical care time: 35 minutes Critical care time was exclusive of separately billable procedures and treating other patients. Critical care was necessary to treat or prevent imminent or life-threatening deterioration. Critical care was time spent personally by me on the following activities: development of treatment plan with patient and/or surrogate as well as nursing, discussions with consultants, evaluation of patient's response to treatment, examination of patient, obtaining history from patient or surrogate, ordering and performing treatments and interventions, ordering and review of laboratory studies, ordering and review of radiographic studies, pulse oximetry and re-evaluation of patient's condition.    Francine Graven, DO 02/28/16 2148

## 2016-02-28 NOTE — ED Notes (Signed)
[employer] called after pacemaker interrogated and they reported that there was nothing new to report. Functioning properly and he only had one fast episode this AM

## 2016-02-29 ENCOUNTER — Observation Stay (HOSPITAL_COMMUNITY): Payer: Medicare Other

## 2016-02-29 DIAGNOSIS — I10 Essential (primary) hypertension: Secondary | ICD-10-CM | POA: Diagnosis present

## 2016-02-29 DIAGNOSIS — R609 Edema, unspecified: Secondary | ICD-10-CM | POA: Diagnosis not present

## 2016-02-29 DIAGNOSIS — E119 Type 2 diabetes mellitus without complications: Secondary | ICD-10-CM | POA: Diagnosis present

## 2016-02-29 DIAGNOSIS — E860 Dehydration: Secondary | ICD-10-CM | POA: Diagnosis present

## 2016-02-29 DIAGNOSIS — C659 Malignant neoplasm of unspecified renal pelvis: Secondary | ICD-10-CM | POA: Diagnosis not present

## 2016-02-29 DIAGNOSIS — I248 Other forms of acute ischemic heart disease: Secondary | ICD-10-CM | POA: Diagnosis present

## 2016-02-29 DIAGNOSIS — H9190 Unspecified hearing loss, unspecified ear: Secondary | ICD-10-CM | POA: Diagnosis present

## 2016-02-29 DIAGNOSIS — E871 Hypo-osmolality and hyponatremia: Secondary | ICD-10-CM | POA: Diagnosis present

## 2016-02-29 DIAGNOSIS — D61818 Other pancytopenia: Secondary | ICD-10-CM

## 2016-02-29 DIAGNOSIS — R079 Chest pain, unspecified: Secondary | ICD-10-CM | POA: Diagnosis not present

## 2016-02-29 DIAGNOSIS — G939 Disorder of brain, unspecified: Secondary | ICD-10-CM | POA: Diagnosis not present

## 2016-02-29 DIAGNOSIS — C7931 Secondary malignant neoplasm of brain: Secondary | ICD-10-CM

## 2016-02-29 DIAGNOSIS — F1020 Alcohol dependence, uncomplicated: Secondary | ICD-10-CM

## 2016-02-29 DIAGNOSIS — R569 Unspecified convulsions: Secondary | ICD-10-CM | POA: Diagnosis present

## 2016-02-29 DIAGNOSIS — C651 Malignant neoplasm of right renal pelvis: Secondary | ICD-10-CM | POA: Diagnosis not present

## 2016-02-29 DIAGNOSIS — H918X1 Other specified hearing loss, right ear: Secondary | ICD-10-CM | POA: Diagnosis present

## 2016-02-29 DIAGNOSIS — R32 Unspecified urinary incontinence: Secondary | ICD-10-CM | POA: Diagnosis present

## 2016-02-29 DIAGNOSIS — C669 Malignant neoplasm of unspecified ureter: Secondary | ICD-10-CM | POA: Diagnosis not present

## 2016-02-29 DIAGNOSIS — Z8509 Personal history of malignant neoplasm of other digestive organs: Secondary | ICD-10-CM

## 2016-02-29 DIAGNOSIS — I2510 Atherosclerotic heart disease of native coronary artery without angina pectoris: Secondary | ICD-10-CM | POA: Diagnosis present

## 2016-02-29 DIAGNOSIS — R319 Hematuria, unspecified: Secondary | ICD-10-CM | POA: Diagnosis present

## 2016-02-29 DIAGNOSIS — R945 Abnormal results of liver function studies: Secondary | ICD-10-CM

## 2016-02-29 DIAGNOSIS — F1010 Alcohol abuse, uncomplicated: Secondary | ICD-10-CM | POA: Diagnosis not present

## 2016-02-29 DIAGNOSIS — D696 Thrombocytopenia, unspecified: Secondary | ICD-10-CM | POA: Diagnosis present

## 2016-02-29 DIAGNOSIS — N400 Enlarged prostate without lower urinary tract symptoms: Secondary | ICD-10-CM | POA: Diagnosis present

## 2016-02-29 DIAGNOSIS — K219 Gastro-esophageal reflux disease without esophagitis: Secondary | ICD-10-CM | POA: Diagnosis present

## 2016-02-29 DIAGNOSIS — Z8550 Personal history of malignant neoplasm of unspecified urinary tract organ: Secondary | ICD-10-CM

## 2016-02-29 DIAGNOSIS — E785 Hyperlipidemia, unspecified: Secondary | ICD-10-CM

## 2016-02-29 DIAGNOSIS — G936 Cerebral edema: Secondary | ICD-10-CM | POA: Diagnosis present

## 2016-02-29 DIAGNOSIS — C679 Malignant neoplasm of bladder, unspecified: Secondary | ICD-10-CM | POA: Diagnosis present

## 2016-02-29 DIAGNOSIS — C661 Malignant neoplasm of right ureter: Secondary | ICD-10-CM | POA: Diagnosis present

## 2016-02-29 DIAGNOSIS — E876 Hypokalemia: Secondary | ICD-10-CM | POA: Diagnosis present

## 2016-02-29 DIAGNOSIS — R410 Disorientation, unspecified: Secondary | ICD-10-CM | POA: Diagnosis not present

## 2016-02-29 DIAGNOSIS — W19XXXA Unspecified fall, initial encounter: Secondary | ICD-10-CM | POA: Diagnosis present

## 2016-02-29 DIAGNOSIS — I350 Nonrheumatic aortic (valve) stenosis: Secondary | ICD-10-CM | POA: Diagnosis present

## 2016-02-29 DIAGNOSIS — I482 Chronic atrial fibrillation: Secondary | ICD-10-CM | POA: Diagnosis present

## 2016-02-29 DIAGNOSIS — R7989 Other specified abnormal findings of blood chemistry: Secondary | ICD-10-CM | POA: Diagnosis not present

## 2016-02-29 DIAGNOSIS — S065X9A Traumatic subdural hemorrhage with loss of consciousness of unspecified duration, initial encounter: Secondary | ICD-10-CM | POA: Diagnosis present

## 2016-02-29 LAB — CBC
HEMATOCRIT: 35.4 % — AB (ref 39.0–52.0)
HEMOGLOBIN: 11.7 g/dL — AB (ref 13.0–17.0)
MCH: 36.6 pg — AB (ref 26.0–34.0)
MCHC: 33.1 g/dL (ref 30.0–36.0)
MCV: 110.6 fL — AB (ref 78.0–100.0)
Platelets: 107 10*3/uL — ABNORMAL LOW (ref 150–400)
RBC: 3.2 MIL/uL — ABNORMAL LOW (ref 4.22–5.81)
RDW: 12.8 % (ref 11.5–15.5)
WBC: 8.2 10*3/uL (ref 4.0–10.5)

## 2016-02-29 LAB — TROPONIN I
TROPONIN I: 0.09 ng/mL — AB (ref <0.03)
Troponin I: 0.15 ng/mL (ref <0.03)
Troponin I: 0.13 ng/mL (ref <0.03)

## 2016-02-29 LAB — MRSA PCR SCREENING: MRSA by PCR: POSITIVE — AB

## 2016-02-29 LAB — GLUCOSE, CAPILLARY
GLUCOSE-CAPILLARY: 254 mg/dL — AB (ref 65–99)
Glucose-Capillary: 108 mg/dL — ABNORMAL HIGH (ref 65–99)

## 2016-02-29 LAB — BASIC METABOLIC PANEL
ANION GAP: 12 (ref 5–15)
BUN: 9 mg/dL (ref 6–20)
CHLORIDE: 99 mmol/L — AB (ref 101–111)
CO2: 25 mmol/L (ref 22–32)
Calcium: 8.1 mg/dL — ABNORMAL LOW (ref 8.9–10.3)
Creatinine, Ser: 0.97 mg/dL (ref 0.61–1.24)
GFR calc Af Amer: >60 mL/min (ref >60)
GFR calc non Af Amer: >60 mL/min (ref >60)
GLUCOSE: 114 mg/dL — AB (ref 65–99)
POTASSIUM: 3.3 mmol/L — AB (ref 3.5–5.1)
Sodium: 136 mmol/L (ref 135–145)

## 2016-02-29 LAB — MAGNESIUM: Magnesium: 1.2 mg/dL — ABNORMAL LOW (ref 1.7–2.4)

## 2016-02-29 MED ORDER — SODIUM CHLORIDE 0.9 % IV SOLN
INTRAVENOUS | Status: DC
  Administered 2016-02-29: 75 mL/h via INTRAVENOUS

## 2016-02-29 MED ORDER — FENTANYL CITRATE (PF) 100 MCG/2ML IJ SOLN: 12.5000 ug | INTRAMUSCULAR | Status: DC | PRN

## 2016-02-29 MED ORDER — MAGNESIUM SULFATE 4 GM/100ML IV SOLN
4.0000 g | Freq: Once | INTRAVENOUS | Status: AC
Start: 2016-02-29 — End: 2016-02-29
  Administered 2016-02-29: 4 g via INTRAVENOUS
  Filled 2016-02-29: qty 100

## 2016-02-29 MED ORDER — POLYETHYLENE GLYCOL 3350 17 G PO PACK
17.0000 g | PACK | Freq: Every day | ORAL | Status: DC | PRN
  Administered 2016-03-03: 17 g via ORAL
  Filled 2016-02-29: qty 1

## 2016-02-29 MED ORDER — CHLORHEXIDINE GLUCONATE CLOTH 2 % EX PADS
6.0000 | MEDICATED_PAD | Freq: Every day | CUTANEOUS | Status: DC
  Administered 2016-03-01 – 2016-03-04 (×4): 6 via TOPICAL

## 2016-02-29 MED ORDER — ORAL CARE MOUTH RINSE
15.0000 mL | Freq: Two times a day (BID) | OROMUCOSAL | Status: DC
  Administered 2016-02-29 – 2016-03-04 (×7): 15 mL via OROMUCOSAL

## 2016-02-29 MED ORDER — SODIUM CHLORIDE 0.9 % IV SOLN
500.0000 mg | Freq: Two times a day (BID) | INTRAVENOUS | Status: DC
  Administered 2016-02-29 – 2016-03-01 (×3): 500 mg via INTRAVENOUS
  Filled 2016-02-29 (×4): qty 5

## 2016-02-29 MED ORDER — FENTANYL CITRATE (PF) 100 MCG/2ML IJ SOLN
25.0000 ug | INTRAMUSCULAR | Status: DC | PRN
  Administered 2016-02-29: 25 ug via INTRAVENOUS
  Filled 2016-02-29: qty 2

## 2016-02-29 MED ORDER — DEXAMETHASONE SODIUM PHOSPHATE 4 MG/ML IJ SOLN
10.0000 mg | Freq: Once | INTRAMUSCULAR | Status: AC
  Administered 2016-02-29: 10 mg via INTRAVENOUS
  Filled 2016-02-29: qty 3

## 2016-02-29 MED ORDER — POTASSIUM CHLORIDE CRYS ER 20 MEQ PO TBCR
30.0000 meq | EXTENDED_RELEASE_TABLET | ORAL | Status: AC
  Administered 2016-02-29 (×2): 30 meq via ORAL
  Filled 2016-02-29 (×2): qty 1

## 2016-02-29 MED ORDER — ADULT MULTIVITAMIN W/MINERALS CH
1.0000 | ORAL_TABLET | Freq: Every day | ORAL | Status: DC
  Administered 2016-02-29 – 2016-03-04 (×5): 1 via ORAL
  Filled 2016-02-29 (×5): qty 1

## 2016-02-29 MED ORDER — SODIUM CHLORIDE 0.9% FLUSH
10.0000 mL | Freq: Two times a day (BID) | INTRAVENOUS | Status: DC
  Administered 2016-02-29 – 2016-03-03 (×5): 10 mL

## 2016-02-29 MED ORDER — THIAMINE HCL 100 MG/ML IJ SOLN: 100.0000 mg | Freq: Every day | INTRAMUSCULAR | Status: DC

## 2016-02-29 MED ORDER — POTASSIUM CHLORIDE IN NACL 40-0.9 MEQ/L-% IV SOLN
INTRAVENOUS | Status: DC
  Administered 2016-02-29 (×2): 50 mL/h via INTRAVENOUS
  Filled 2016-02-29: qty 1000

## 2016-02-29 MED ORDER — ACETAMINOPHEN 650 MG RE SUPP: 650.0000 mg | Freq: Four times a day (QID) | RECTAL | Status: DC | PRN

## 2016-02-29 MED ORDER — MUPIROCIN 2 % EX OINT
1.0000 "application " | TOPICAL_OINTMENT | Freq: Two times a day (BID) | CUTANEOUS | Status: DC
  Administered 2016-02-29 – 2016-03-04 (×8): 1 via NASAL
  Filled 2016-02-29 (×3): qty 22

## 2016-02-29 MED ORDER — POTASSIUM CHLORIDE IN NACL 40-0.9 MEQ/L-% IV SOLN
INTRAVENOUS | Status: DC
  Administered 2016-02-29: 75 mL/h via INTRAVENOUS
  Filled 2016-02-29: qty 1000

## 2016-02-29 MED ORDER — FAMOTIDINE 20 MG PO TABS
20.0000 mg | ORAL_TABLET | Freq: Two times a day (BID) | ORAL | Status: DC
  Administered 2016-02-29 – 2016-03-01 (×3): 20 mg via ORAL
  Filled 2016-02-29 (×3): qty 1

## 2016-02-29 MED ORDER — LORAZEPAM 1 MG PO TABS
1.0000 mg | ORAL_TABLET | Freq: Four times a day (QID) | ORAL | Status: AC | PRN
  Administered 2016-02-29: 1 mg via ORAL
  Filled 2016-02-29 (×2): qty 1

## 2016-02-29 MED ORDER — FOLIC ACID 1 MG PO TABS
1.0000 mg | ORAL_TABLET | Freq: Every day | ORAL | Status: DC
  Administered 2016-02-29 – 2016-03-04 (×5): 1 mg via ORAL
  Filled 2016-02-29 (×5): qty 1

## 2016-02-29 MED ORDER — LORAZEPAM 2 MG/ML IJ SOLN
1.0000 mg | Freq: Four times a day (QID) | INTRAMUSCULAR | Status: AC | PRN
  Administered 2016-02-29 – 2016-03-02 (×3): 1 mg via INTRAVENOUS
  Filled 2016-02-29 (×3): qty 1

## 2016-02-29 MED ORDER — MUPIROCIN 2 % EX OINT
TOPICAL_OINTMENT | CUTANEOUS | Status: AC
  Filled 2016-02-29: qty 22

## 2016-02-29 MED ORDER — SODIUM CHLORIDE 0.9% FLUSH
10.0000 mL | INTRAVENOUS | Status: DC | PRN
  Administered 2016-03-04: 10 mL
  Filled 2016-02-29: qty 40

## 2016-02-29 MED ORDER — VITAMIN B-1 100 MG PO TABS
100.0000 mg | ORAL_TABLET | Freq: Every day | ORAL | Status: DC
  Administered 2016-02-29 – 2016-03-04 (×5): 100 mg via ORAL
  Filled 2016-02-29 (×6): qty 1

## 2016-02-29 MED ORDER — LORAZEPAM 2 MG/ML IJ SOLN: 1.0000 mg | INTRAMUSCULAR | Status: DC | PRN

## 2016-02-29 MED ORDER — SODIUM CHLORIDE 0.9% FLUSH
3.0000 mL | Freq: Two times a day (BID) | INTRAVENOUS | Status: DC
  Administered 2016-02-29 – 2016-03-04 (×8): 3 mL via INTRAVENOUS

## 2016-02-29 MED ORDER — PANTOPRAZOLE SODIUM 40 MG PO TBEC
40.0000 mg | DELAYED_RELEASE_TABLET | Freq: Every day | ORAL | Status: DC
  Administered 2016-02-29 – 2016-03-04 (×5): 40 mg via ORAL
  Filled 2016-02-29 (×5): qty 1

## 2016-02-29 MED ORDER — ACETAMINOPHEN 325 MG PO TABS: 650.0000 mg | ORAL_TABLET | Freq: Four times a day (QID) | ORAL | Status: DC | PRN

## 2016-02-29 MED ORDER — DEXAMETHASONE SODIUM PHOSPHATE 4 MG/ML IJ SOLN
4.0000 mg | Freq: Four times a day (QID) | INTRAMUSCULAR | Status: DC
  Administered 2016-02-29 – 2016-03-02 (×8): 4 mg via INTRAVENOUS
  Filled 2016-02-29 (×8): qty 1

## 2016-02-29 MED ORDER — HYDROCODONE-ACETAMINOPHEN 5-325 MG PO TABS: 1.0000 | ORAL_TABLET | Freq: Four times a day (QID) | ORAL | Status: DC | PRN

## 2016-02-29 MED ORDER — HYDROCODONE-ACETAMINOPHEN 5-325 MG PO TABS
1.0000 | ORAL_TABLET | Freq: Four times a day (QID) | ORAL | Status: DC | PRN
  Administered 2016-02-29: 1 via ORAL
  Filled 2016-02-29: qty 1

## 2016-02-29 NOTE — Evaluation (Signed)
Physical Therapy Evaluation Patient Details Name: [name] MRN: [id number] DOB: 03-14-1930 Today's Date: 02/29/2016   History of Present Illness  Patient is an 82 yo male admitted 02/28/16 after being found on floor by daughter.  Patient with similar syncopal episode several days prior.  Patient with new seizures, metastatic brain lesions, and delerium.    PMH:  Afib, SDH with craniotomy, pacemaker, DM, transitional cell CA, TIA, HTN, ETOH use, CAD    Clinical Impression  Patient presents with problems listed below.  Will benefit from acute PT to maximize functional mobility prior to discharge.  Patient with decreased cognition / delerium impacting functional mobility and safety.  Recommend SNF at d/c for continued therapy.   If cognition clears, may progress to home with HHPT.  Will continue to assess.    Follow Up Recommendations SNF;Supervision/Assistance - 24 hour    Equipment Recommendations  None recommended by PT    Recommendations for Other Services       Precautions / Restrictions Precautions Precautions: Fall Precaution Comments: syncope and seizures Restrictions Weight Bearing Restrictions: No      Mobility  Bed Mobility Overal bed mobility: Needs Assistance;+2 for physical assistance Bed Mobility: Supine to Sit;Sit to Supine     Supine to sit: Mod assist;+2 for physical assistance Sit to supine: Mod assist;+2 for physical assistance   General bed mobility comments: Verbal and tactile cues for technique.  Assist to raise trunk to sitting position.  Once upright, patient able to maintain sitting balance with min guard assist.  Patient sat EOB x 10 minutes.  Returned to supine with +2 mod assist.  Patient impulsive, laying back onto bed before in proper position.  Assist (+2 total) to move to [facility].  Transfers                 General transfer comment: NT   Ambulation/Gait                Stairs            Wheelchair Mobility    Modified  [place] (Stroke Patients Only)       Balance Overall balance assessment: Needs assistance Sitting-balance support: No upper extremity supported;Feet supported Sitting balance-Leahy Scale: Fair                                       Pertinent Vitals/Pain Pain Assessment: No/denies pain    Home Living Family/patient expects to be discharged to:: Private residence Living Arrangements: Children (Daughter lives with patient) Available Help at Discharge: Family Type of Home: House Home Access: Stairs to enter Entrance Stairs-Rails: Right Entrance Stairs-Number of Steps: 2 Home Layout: One level Home Equipment: Environmental consultant - 2 wheels;Cane - single point      Prior Function Level of Independence: Needs assistance   Gait / Transfers Assistance Needed: ambulatory with cane/walker at baseline  ADL's / Homemaking Assistance Needed: Assist with meal prep, housekeeping, bathing.        Hand Dominance        Extremity/Trunk Assessment   Upper Extremity Assessment: Overall WFL for tasks assessed           Lower Extremity Assessment: Generalized weakness         Communication   Communication: HOH  Cognition Arousal/Alertness: Awake/alert Behavior During Therapy: Restless;Impulsive (Hallucinating) Overall Cognitive Status: Impaired/Different from baseline Area of Impairment: Orientation;Attention;Memory;Safety/judgement;Awareness;Problem solving Orientation Level: Disoriented to;Place;Time;Situation Current Attention  Level: Sustained Memory: Decreased short-term memory   Safety/Judgement: Decreased awareness of deficits;Decreased awareness of safety   Problem Solving: Slow processing;Difficulty sequencing General Comments: Patient reaching for objects not present as PT entered room.  Impulsive.  Difficulty answering questions appropriately.    General Comments      Exercises        Assessment/Plan    PT Assessment Patient needs continued PT  services  PT Diagnosis Difficulty walking;Generalized weakness;Altered mental status   PT Problem List Decreased strength;Decreased activity tolerance;Decreased balance;Decreased mobility;Decreased cognition;Decreased knowledge of use of DME;Decreased safety awareness  PT Treatment Interventions DME instruction;Gait training;Functional mobility training;Therapeutic activities;Therapeutic exercise;Balance training;Cognitive remediation;Patient/family education   PT Goals (Current goals can be found in the Care Plan section) Acute Rehab PT Goals Patient Stated Goal: Unable to state PT Goal Formulation: With family Time For Goal Achievement: 03/14/16 Potential to Achieve Goals: Fair    Frequency Min 3X/week   Barriers to discharge        Co-evaluation               End of Session   Activity Tolerance: Patient limited by fatigue Patient left: in bed;with call bell/phone within reach;with bed alarm set;with family/visitor present Nurse Communication: Mobility status    Functional Assessment Tool Used: Clinical judgement Functional Limitation: Mobility: Walking and moving around Mobility: Walking and Moving Around Current Status [id number]): At least 40 percent but less than 60 percent impaired, limited or restricted Mobility: Walking and Moving Around Goal Status [phone]): At least 1 percent but less than 20 percent impaired, limited or restricted    Time: [id number] PT Time Calculation (min) (ACUTE ONLY): 21 min   Charges:   PT Evaluation $PT Eval Moderate Complexity: 1 Procedure     PT G Codes:   PT G-Codes **NOT FOR INPATIENT CLASS** Functional Assessment Tool Used: Clinical judgement Functional Limitation: Mobility: Walking and moving around Mobility: Walking and Moving Around Current Status [id number]): At least 40 percent but less than 60 percent impaired, limited or restricted Mobility: Walking and Moving Around Goal Status [phone]): At least 1 percent but less than 20 percent  impaired, limited or restricted    Despina Pole 02/29/2016, 5:09 PM Carita Pian. Sanjuana Kava, [place] Pager [phone]

## 2016-02-29 NOTE — Progress Notes (Signed)
EEG Completed; Results Pending  

## 2016-02-29 NOTE — Procedures (Signed)
History: 82 yo M with brain masses and seizure  Sedation: none  Technique: This is a 21 channel routine scalp EEG performed at the bedside with bipolar and monopolar montages arranged in accordance to the international 10/20 system of electrode placement. One channel was dedicated to EKG recording.    Background: The majority of this recording is at least partially obscured by muscle artifact, but during periods of quiescence, there is a posterior rhythm of 11 Hz that appears symmetric.   Photic stimulation: Physiologic driving is not performed  EEG Abnormalities: None, though limited by muscle artifact.   Clinical Interpretation: This normal EEG is recorded in the waking and drowsy state. There was no seizure or seizure predisposition recorded on this study. Please note that a normal EEG does not preclude the possibility of epilepsy.   [name], MD [facility] [phone]  If 7pm- 7am, please page neurology on call as listed in [place].

## 2016-02-29 NOTE — Progress Notes (Signed)
Subjective:  no further seizure  Exam: Vitals:   02/29/16 0400 02/29/16 0708  BP: 94/72 (!) 180/98  Pulse: (!) 59 62  Resp: 15 17  Temp:  100.3 F (37.9 C)   Gen: In bed, NAD Resp: non-labored breathing, no acute distress Abd: soft, nt  Neuro: MS: Awake, alert, follows commands. Oriented to self but not place (thinks he is at home) CN: Pupils equal round and reactive to light, extraocular movements intact Motor: Moves all extremities well Sensory: Intact to light touch    Impression: 82 yo M with new onset seizures found have metastatic lesions in the brain. He continues to be confused, likely a combination of postictal effect, delirium. It does appear that he has a mild subarachnoid or small subdural hematoma along the falx. He will need repeat imaging.  Recommendations: 1) agree with starting steroids 2) Keppra 500 mg twice a day 3) EEG 4) hold Plavix for one week 5) repeat head CT  [name], MD [facility] [phone]  If 7pm- 7am, please page neurology on call as listed in [place].

## 2016-02-29 NOTE — Evaluation (Signed)
Clinical/Bedside Swallow Evaluation Patient Details  Name: [name] MRN: [id number] Date of Birth: May 06, 1930  Today's Date: 02/29/2016 Time: SLP Start Time (ACUTE ONLY): 0820 SLP Stop Time (ACUTE ONLY): 0843 SLP Time Calculation (min) (ACUTE ONLY): 23 min  Past Medical History:  Past Medical History:  Diagnosis Date  . Allergy   . Arthritis    degenerative-knees  . Atrial fibrillation ([place]) 1990  . Bladder cancer ([place]) 2010  . Cancer ([place]) 05/05/07-06/15/07   parotid gland/66.4 GY  . Cancer ([place])    left eyelid   . Chronic kidney disease    positive cytology  . Coronary artery disease   . Diabetes mellitus without complication ([place])    meds d/c  2-3 year ago  . Dry eyes   . Dyslipidemia   . Dysrhythmia    HX A FIB  . GERD (gastroesophageal reflux disease)   . Heart murmur    2/6 systolic  . History of chemotherapy     Hx carboplatin/44fu  . History of radiation therapy 05/05/07-06/15/07   right parotid through subclavicular region/ mid jugularlymph node chain  . History of radiation therapy 05/05/07-06/15/07   Right parotid/hemineck,supraclavicular  . HOH (hard of hearing)   . Hypertension    labile  . Pacemaker    2003 vent paced , rate 69  . Pneumonia    hx of   . Prostate hypertrophy   . Right groin hernia   . Skin cancer    HX BASAL CELL REMOVED  . Stroke [facility]) 2003   TIA  . Tinnitus    right   . Urinary tract infection    hx of   . Xerostomia    limited   Past Surgical History:  Past Surgical History:  Procedure Laterality Date  . BACK SURGERY    . CARDIAC CATHETERIZATION  09/20/2012   EF 50-55%, moderately calcified aortic valve leaflets, mild-moderate aortic valve stenosis, LA-appendage moderately dilated, moderate regurg of the mitral valve, RA moderately dilated  . CARDIOVASCULAR STRESS TEST  09/05/2009   Pharmalogical stress test w/o chest pain or EKG changes for ischemia  . CRANIOTOMY  12/14/2011   Procedure: CRANIOTOMY HEMATOMA  EVACUATION SUBDURAL;  Surgeon: Ophelia Charter, MD;  Location: [place] [facility];  Service: Neurosurgery;  Laterality: Left;  LEFT Craniotomy for subdural   . CYSTOSCOPY  01/2011   neg  . CYSTOSCOPY W/ RETROGRADES  04/01/2012   Procedure: CYSTOSCOPY WITH RETROGRADE PYELOGRAM;  Surgeon: Claybon Jabs, MD;  Location: [facility];  Service: Urology;  Laterality: Bilateral;  . CYSTOSCOPY WITH BIOPSY  04/01/2012   Procedure: CYSTOSCOPY WITH BIOPSY;  Surgeon: Claybon Jabs, MD;  Location: [facility];  Service: Urology;  Laterality: N/A;  BLADDER BIOPSIES and bladder washings  . CYSTOSCOPY WITH BIOPSY  05/02/2012   Procedure: CYSTOSCOPY WITH BIOPSY;  Surgeon: Claybon Jabs, MD;  Location: [facility];  Service: Urology;  Laterality: N/A;  . CYSTOSCOPY WITH STENT PLACEMENT  05/02/2012   Procedure: CYSTOSCOPY WITH STENT PLACEMENT;  Surgeon: Claybon Jabs, MD;  Location[facility];  Service: Urology;  Laterality: Bilateral;  . CYSTOSCOPY WITH URETEROSCOPY AND STENT PLACEMENT Right 02/15/2015   Procedure: CYSTOSCOPY WITH RIGHT URETEROSCOPY, RENAL PELVIC WASHINGS, STENT, RETROGRADE;  Surgeon: Kathie Rhodes, MD;  Location: [facility];  Service: Urology;  Laterality: Right;  . CYSTOSCOPY/RETROGRADE/URETEROSCOPY  05/02/2012   Procedure: CYSTOSCOPY/RETROGRADE/URETEROSCOPY;  Surgeon: Claybon Jabs, MD;  Location[facility];  Service: Urology;  Laterality: Bilateral;  CYSTOSCOPY BILATERAL RETROGRADE PYLOGRAM BILATERAL URETEROSCOPY AND BIOPSY    . CYSTOSCOPY/RETROGRADE/URETEROSCOPY Bilateral 01/21/2015   Procedure: CYSTOSCOPY/RETROGRADE/ BLADDER BIOPSY;  Surgeon: Kathie Rhodes, MD;  Location: [facility];  Service: Urology;  Laterality: Bilateral;  . LOWER EXTREMITY ARTERIAL DOPPLER  09/28/2011   No evidence of arterial insufficiency.   . [place]  . PACEMAKER GENERATOR CHANGE  05/23/2002   [facility], [place],  serial [phone]  . PACEMAKER GENERATOR CHANGE N/A 09/01/2013   Procedure: PACEMAKER GENERATOR CHANGE;  Surgeon: Sanda Klein, MD;  Location: [place] [facility];  Service: Cardiovascular;  Laterality: N/A;  . [place]   last changed 2003  . PAROTIDECTOMY     right  . URETEROSCOPY  04/01/2012   Procedure: URETEROSCOPY;  Surgeon: Claybon Jabs, MD;  Location: [facility];  Service: Urology;  Laterality: Right;   HPI:  [name] an 82 y.o.malea remote history of stroke, pacemaker implantation, hypertension, hyperlipidemia, diabetes mellitus, atrial fibrillation and coronary artery disease brought to the emergency room following an episode of unconsciousness at home. He had been seen normal 1 hour prior to being found unconscious by his daughter. He was confused on waking up. He does not remember losing consciousness. He said he has had recent falls as well. Patient had a witnessed seizure in the emergency room. IV Keppra load was recommended. CT scan of his head showed a low-density right frontal subcortical white matter lesion concerning for edema. Acute infarction or neoplasm could not be ruled out. There was also a high density lesion in the right frontal parafalcine region. Significance is unclear. MRI is not been obtained because patient has a cardiac pacemaker. Repeat CT scan of the head with contrast is pending.  Most recent chest xray showing no acute findings.  Of note the patient had previous left craniotomy.    Pt had previous BSE dated 10/05/2011 with recommendation for a regular diet and thin liquids.     Assessment / Plan / Recommendation Clinical Impression  Clinical swallowing evaluation was completed using thin liquids via tsp and cup sips, pureed material and dual textured solids.  Oral mechanism exam was completed and generalized weakness was noted.  Of note, the patient has history of previous craniotomy with swallowing evaluation dated 10/05/2011  with recommendation for a regular diet and thin liquids.  The patient is currently reporting no trouble swallowing, however, he is very confused.  Clinically the patient presented with oral and pharyngeal dysphagia characterized by decreased labial seal with anterior escape noted with liquids, decreased bolus awareness (i.e. the patient would talk with food in his mouth), delayed oral transit particularly for purees and dual textured solids and delayed swallow trigger.  Overt s/s of aspiration were not seen and the patient's vitals remained stable.  However, the patient is an aspiration risk due to his current mentation and decreased awareness of the bolus.  Recommend begin a dysphagia 1 diet with thin liquids with full assist for all intake.  Medications should be given whole in purees.  Suspect diet could be advanced as mentation clears.  ST will follow up for therapeutic diet tolerance and for possible advancement of diet.      Aspiration Risk  Moderate aspiration risk    Diet Recommendation   Dysphagia 1 with thin liquids - no straws  Medication Administration: Whole meds with puree    Other  Recommendations Oral Care Recommendations: Oral care BID   Follow up Recommendations   (  TBD)    Frequency and Duration min 2x/week  2 weeks       Prognosis Prognosis for Safe Diet Advancement: Good Barriers to Reach Goals: Cognitive deficits      Swallow Study   General Date of Onset: 02/28/16 HPI: JOHNWILLIAM HUBERS an 82 y.o.malea remote history of stroke, pacemaker implantation, hypertension, hyperlipidemia, diabetes mellitus, atrial fibrillation and coronary artery disease brought to the emergency room following an episode of unconsciousness at home. He had been seen normal 1 hour prior to being found unconscious by his daughter. He was confused on waking up. He does not remember losing consciousness. He said he has had recent falls as well. Patient had a witnessed seizure in the emergency  room. IV Keppra load was recommended. CT scan of his head showed a low-density right frontal subcortical white matter lesion concerning for edema. Acute infarction or neoplasm could not be ruled out. There was also a high density lesion in the right frontal parafalcine region. Significance is unclear. MRI is not been obtained because patient has a cardiac pacemaker. Repeat CT scan of the head with contrast is pending.  Most recent chest xray showing no acute findings.  Of note the patient had previous left craniotomy.    Pt had previous BSE dated 10/05/2011 with recommendation for a regular diet and thin liquids.   Type of Study: Bedside Swallow Evaluation Previous Swallow Assessment: 10/05/2011 with rx for Regular/thin. Diet Prior to this Study: NPO Temperature Spikes Noted: Yes Respiratory Status: Nasal cannula History of Recent Intubation: No Behavior/Cognition: Confused;Requires cueing Oral Cavity Assessment: Dry Oral Care Completed by SLP: No Oral Cavity - Dentition: Missing dentition Vision: Functional for self-feeding Self-Feeding Abilities: Needs assist Patient Positioning: Upright in bed Baseline Vocal Quality: Normal Volitional Cough: Strong Volitional Swallow: Unable to elicit    Oral/Motor/Sensory Function Overall Oral Motor/Sensory Function: Mild impairment Facial ROM: Within Functional Limits Facial Symmetry: Within Functional Limits Facial Strength: Within Functional Limits Lingual ROM: Within Functional Limits Lingual Symmetry: Within Functional Limits Lingual Strength: Reduced Mandible: Within Functional Limits   Ice Chips Ice chips: Impaired Presentation: Spoon Oral Phase Impairments: Impaired mastication Oral Phase Functional Implications: Prolonged oral transit Pharyngeal Phase Impairments: Suspected delayed Swallow   Thin Liquid Thin Liquid: Impaired Presentation: Cup;Spoon Oral Phase Impairments: Reduced labial seal Oral Phase Functional Implications: Right  anterior spillage Pharyngeal  Phase Impairments: Suspected delayed Swallow    Nectar Thick Nectar Thick Liquid: Not tested   Honey Thick Honey Thick Liquid: Not tested   Puree Puree: Impaired Presentation: Spoon Oral Phase Impairments: Poor awareness of bolus;Impaired mastication Oral Phase Functional Implications: Prolonged oral transit;Oral holding Pharyngeal Phase Impairments: Suspected delayed Swallow   Solid   GO   Solid: Impaired Presentation: Spoon Oral Phase Impairments: Poor awareness of bolus;Impaired mastication Oral Phase Functional Implications: Prolonged oral transit;Oral holding Pharyngeal Phase Impairments: Suspected delayed Swallow    Functional Assessment Tool Used: ASHA NOMS and clinical judgment.   Functional Limitations: Swallowing Swallow Current Status [phone]): At least 40 percent but less than 60 percent impaired, limited or restricted Swallow Goal Status [phone]): At least 20 percent but less than 40 percent impaired, limited or restricted   Shelly Flatten, MA, [place] [phone] Shelly Flatten N 02/29/2016,8:54 AM

## 2016-02-29 NOTE — Progress Notes (Addendum)
Addendum  [employer]: his Pacemaker device will not allow MRI and hence he cannot do MRI brain. As per Neuro f/u, hold Plavix for 1 week.  Vernell Leep, MD, FACP, FHM. [facility] Pager [phone]  If 7PM-7AM, please contact night-coverage [email or web address] Password [email or web address] 02/29/2016, 12:14 PM

## 2016-02-29 NOTE — Consult Note (Signed)
[place] NOTE  Patient Care Team: [name], MD as PCP - General (Internal Medicine)  CHIEF COMPLAINTS/PURPOSE OF CONSULTATION:  New brain metastasis  HISTORY OF PRESENTING ILLNESS:  [name] 82 y.o. 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 I review his electronic records extensively. The patient has very poor hearing. I collaborated the history with his family members including his daughter today. 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 It was resected by ENT. There was perineural invasion. He received chemotherapy with carboplatin, Taxotere and 5-FU followed by concurrent chemoradiation therapy with weekly carboplatin and completed treatment by December 2008. He was lost to follow-up to ENT. His last appointment with radiation oncologist was in January 2014, discharged due to long-term remission status. The patient had diagnosis of high-grade urothelial carcinoma and received chemotherapy from February to June 2017. His last CT imaging of the chest, abdomen and pelvis show no evidence of adenopathy. The patient was placed on observation. The patient had significant alcohol intake with recurrent falls.  Even though the patient had atrial fibrillation, he was not on chronic anticoagulation therapy due to previous subdural hematoma. The patient had other significant comorbidities. He was found to have loss of consciousness and was brought into the emergency department yesterday and was admitted. He was also noted to have mental confusion and witnessed seizures. According to his daughter, he complained of severe headache last week. Subsequent evaluation included CT scan of the head with contrast demonstrated new brain lesions. MRI was contraindicated due to presence of permanent pacemaker. Since admission, he was placed on high-dose  dexamethasone along with antiseizure medications. There were no further witnessed seizures since then. The patient denies neurological deficit. He denies swallowing difficulties. He denies further headaches.  MEDICAL HISTORY:  Past Medical History:  Diagnosis Date  . Allergy   . Arthritis    degenerative-knees  . Atrial fibrillation ([place]) 1990  . Bladder cancer ([place]) 2010  . Cancer ([place]) 05/05/07-06/15/07   parotid gland/66.4 GY  . Cancer ([place])    left eyelid   . Chronic kidney disease    positive cytology  . Coronary artery disease   . Diabetes mellitus without complication ([place])    meds d/c  2-3 year ago  . Dry eyes   . Dyslipidemia   . Dysrhythmia    HX A FIB  . GERD (gastroesophageal reflux disease)   . Heart murmur    2/6 systolic  . History of chemotherapy     Hx carboplatin/74fu  . History of radiation therapy 05/05/07-06/15/07   right parotid through subclavicular region/ mid jugularlymph node chain  . History of radiation therapy 05/05/07-06/15/07   Right parotid/hemineck,supraclavicular  . HOH (hard of hearing)   . Hypertension    labile  . Pacemaker    2003 vent paced , rate 69  . Pneumonia    hx of   . Prostate hypertrophy   . Right groin hernia   . Skin cancer    HX BASAL CELL REMOVED  . Stroke [facility]) 2003   TIA  . Tinnitus    right   . Urinary tract infection    hx of   . Xerostomia    limited    SURGICAL HISTORY: Past Surgical History:  Procedure Laterality Date  . BACK SURGERY    . CARDIAC CATHETERIZATION  09/20/2012   EF 50-55%, moderately calcified aortic valve  leaflets, mild-moderate aortic valve stenosis, LA-appendage moderately dilated, moderate regurg of the mitral valve, RA moderately dilated  . CARDIOVASCULAR STRESS TEST  09/05/2009   Pharmalogical stress test w/o chest pain or EKG changes for ischemia  . CRANIOTOMY  12/14/2011   Procedure: CRANIOTOMY HEMATOMA EVACUATION SUBDURAL;  Surgeon: Ophelia Charter, MD;  Location: [facility];   Service: Neurosurgery;  Laterality: Left;  LEFT Craniotomy for subdural   . CYSTOSCOPY  01/2011   neg  . CYSTOSCOPY W/ RETROGRADES  04/01/2012   Procedure: CYSTOSCOPY WITH RETROGRADE PYELOGRAM;  Surgeon: Claybon Jabs, MD;  Location: [facility];  Service: Urology;  Laterality: Bilateral;  . CYSTOSCOPY WITH BIOPSY  04/01/2012   Procedure: CYSTOSCOPY WITH BIOPSY;  Surgeon: Claybon Jabs, MD;  Location: [facility];  Service: Urology;  Laterality: N/A;  BLADDER BIOPSIES and bladder washings  . CYSTOSCOPY WITH BIOPSY  05/02/2012   Procedure: CYSTOSCOPY WITH BIOPSY;  Surgeon: Claybon Jabs, MD;  Location: [facility];  Service: Urology;  Laterality: N/A;  . CYSTOSCOPY WITH STENT PLACEMENT  05/02/2012   Procedure: CYSTOSCOPY WITH STENT PLACEMENT;  Surgeon: Claybon Jabs, MD;  Location: [facility];  Service: Urology;  Laterality: Bilateral;  . CYSTOSCOPY WITH URETEROSCOPY AND STENT PLACEMENT Right 02/15/2015   Procedure: CYSTOSCOPY WITH RIGHT URETEROSCOPY, RENAL PELVIC WASHINGS, STENT, RETROGRADE;  Surgeon: Kathie Rhodes, MD;  Location: [facility];  Service: Urology;  Laterality: Right;  . CYSTOSCOPY/RETROGRADE/URETEROSCOPY  05/02/2012   Procedure: CYSTOSCOPY/RETROGRADE/URETEROSCOPY;  Surgeon: Claybon Jabs, MD;  Location: [facility];  Service: Urology;  Laterality: Bilateral;  CYSTOSCOPY BILATERAL RETROGRADE PYLOGRAM BILATERAL URETEROSCOPY AND BIOPSY    . CYSTOSCOPY/RETROGRADE/URETEROSCOPY Bilateral 01/21/2015   Procedure: CYSTOSCOPY/RETROGRADE/ BLADDER BIOPSY;  Surgeon: Kathie Rhodes, MD;  Location: [facility];  Service: Urology;  Laterality: Bilateral;  . LOWER EXTREMITY ARTERIAL DOPPLER  09/28/2011   No evidence of arterial insufficiency.   . [place]  . PACEMAKER GENERATOR CHANGE  05/23/2002   [facility] [place], [place], serial [phone]  . PACEMAKER GENERATOR CHANGE N/A 09/01/2013   Procedure:  PACEMAKER GENERATOR CHANGE;  Surgeon: Sanda Klein, MD;  Location: [place] [facility];  Service: Cardiovascular;  Laterality: N/A;  . [place]   last changed 2003  . PAROTIDECTOMY     right  . URETEROSCOPY  04/01/2012   Procedure: URETEROSCOPY;  Surgeon: Claybon Jabs, MD;  Location: [facility];  Service: Urology;  Laterality: Right;    SOCIAL HISTORY: Social History   Social History  . Marital status: Widowed    Spouse name: N/A  . Number of children: N/A  . Years of education: N/A   Occupational History  . Not on file.   Social History Main Topics  . Smoking status: Never Smoker  . Smokeless tobacco: Never Used  . Alcohol use 10.5 oz/week    21 Standard drinks or equivalent per week     Comment: 3 /day( bourbon)  . Drug use: No  . Sexual activity: No   Other Topics Concern  . Not on file   Social History Narrative  . No narrative on file    FAMILY HISTORY: Family History  Problem Relation Age of Onset  . Heart disease Mother   . Cancer Father     Lung cancer  . Emphysema Brother   . Coronary artery disease Son     ALLERGIES:  is allergic to codeine and docetaxel.  MEDICATIONS:  Current Facility-Administered Medications  Medication Dose Route Frequency Provider Last Rate Last Dose  . 0.9 % NaCl with KCl 40 mEq / L  infusion   Intravenous Continuous Modena Jansky, MD 50 mL/hr at 02/29/16 1025 50 mL/hr at 02/29/16 1025  . acetaminophen (TYLENOL) tablet 650 mg  650 mg Oral Q6H PRN Modena Jansky, MD       Or  . acetaminophen (TYLENOL) suppository 650 mg  650 mg Rectal Q6H PRN Modena Jansky, MD      . Derrill Memo ON 03/01/2016] Chlorhexidine Gluconate Cloth 2 % PADS 6 each  6 each Topical Q0600 Modena Jansky, MD      . dexamethasone (DECADRON) injection 4 mg  4 mg Intravenous Q6H Modena Jansky, MD   4 mg at 02/29/16 1645  . famotidine (PEPCID) tablet 20 mg  20 mg Oral BID Modena Jansky, MD   20 mg at 02/29/16 1023  .  fentaNYL (SUBLIMAZE) injection 12.5 mcg  12.5 mcg Intravenous Q4H PRN Modena Jansky, MD      . folic acid (FOLVITE) tablet 1 mg  1 mg Oral Daily Modena Jansky, MD   1 mg at 02/29/16 1023  . HYDROcodone-acetaminophen (NORCO/VICODIN) 5-325 MG per tablet 1 tablet  1 tablet Oral Q6H PRN Modena Jansky, MD   1 tablet at 02/29/16 1353  . levETIRAcetam (KEPPRA) 500 mg in sodium chloride 0.9 % 100 mL IVPB  500 mg Intravenous Q12H Modena Jansky, MD   500 mg at 02/29/16 1024  . LORazepam (ATIVAN) tablet 1 mg  1 mg Oral Q6H PRN Modena Jansky, MD   1 mg at 02/29/16 1353   Or  . LORazepam (ATIVAN) injection 1 mg  1 mg Intravenous Q6H PRN Modena Jansky, MD      . LORazepam (ATIVAN) injection 1 mg  1 mg Intravenous Q4H PRN Modena Jansky, MD      . MEDLINE mouth rinse  15 mL Mouth Rinse BID Modena Jansky, MD   15 mL at 02/29/16 1000  . multivitamin with minerals tablet 1 tablet  1 tablet Oral Daily Modena Jansky, MD   1 tablet at 02/29/16 1023  . mupirocin ointment (BACTROBAN) 2 % 1 application  1 application Nasal BID Modena Jansky, MD   1 application at [id number]  . pantoprazole (PROTONIX) EC tablet 40 mg  40 mg Oral Daily Modena Jansky, MD   40 mg at 02/29/16 1023  . polyethylene glycol (MIRALAX / GLYCOLAX) packet 17 g  17 g Oral Daily PRN Modena Jansky, MD      . sodium chloride flush (NS) 0.9 % injection 10-40 mL  10-40 mL Intracatheter Q12H Modena Jansky, MD   10 mL at 02/29/16 1024  . sodium chloride flush (NS) 0.9 % injection 10-40 mL  10-40 mL Intracatheter PRN Modena Jansky, MD      . sodium chloride flush (NS) 0.9 % injection 3 mL  3 mL Intravenous Q12H Modena Jansky, MD   3 mL at 02/29/16 0130  . thiamine (VITAMIN B-1) tablet 100 mg  100 mg Oral Daily Modena Jansky, MD   100 mg at 02/29/16 1023   Or  . thiamine (B-1) injection 100 mg  100 mg Intravenous Daily Modena Jansky, MD        REVIEW OF SYSTEMS:   Constitutional: Denies fevers, chills or  abnormal night sweats Eyes: Denies blurriness of vision, double  vision or watery eyes Ears, nose, mouth, throat, and face: Denies mucositis or sore throat Respiratory: Denies cough, dyspnea or wheezes Cardiovascular: Denies palpitation, chest discomfort or lower extremity swelling Gastrointestinal:  Denies nausea, heartburn or change in bowel habits Skin: Denies abnormal skin rashes Lymphatics: Denies new lymphadenopathy or easy bruising Behavioral/Psych: Mood is stable, no new changes  All other systems were reviewed with the patient and are negative.  PHYSICAL EXAMINATION: ECOG PERFORMANCE STATUS: 2 - Symptomatic, <50% confined to bed  Vitals:   02/29/16 0708 02/29/16 1200  BP: (!) 180/98 139/71  Pulse: 62 60  Resp: 17 19  Temp: 100.3 F (37.9 C)    Filed Weights   02/28/16 1422 02/29/16 0500  Weight: 190 lb (86.2 kg) 198 lb 6.4 oz (90 kg)    GENERAL:alert, no distress and comfortable. He has very poor hearing SKIN: He has significance and now keratosis and extensive bruises throughout  EYES: normal, conjunctiva are pink and non-injected, sclera clear OROPHARYNX:no exudate, no erythema and lips, buccal mucosa, and tongue normal . Very poor dental hygiene  NECK: Noted well healed parotid scar. Very mild edema around his neck likely due to prior radiation treatment but no definitive lymphadenopathy palpable LYMPH:  no palpable lymphadenopathy in the cervical, axillary or inguinal LUNGS: clear to auscultation and percussion with normal breathing effort HEART: regular rate & rhythm and no murmurs and no lower extremity edema ABDOMEN:abdomen soft, non-tender and normal bowel sounds Musculoskeletal:no cyanosis of digits and no clubbing  PSYCH: alert & oriented x 3 with fluent speech NEURO: no focal motor/sensory deficits  LABORATORY DATA:  I have reviewed the data as listed Lab Results  Component Value Date   WBC 8.2 02/29/2016   HGB 11.7 (L) 02/29/2016   HCT 35.4 (L)  02/29/2016   MCV 110.6 (H) 02/29/2016   PLT 107 (L) 02/29/2016    Recent Labs  09/01/15 0601  12/18/15 1145 01/08/16 1109 02/28/16 1630 02/29/16 0655  NA 135  < > 139 140 134* 136  K 3.5  < > 4.4 4.7 3.2* 3.3*  CL 102  --   --   --  97* 99*  CO2 25  < > 24 24 29 25   GLUCOSE 129*  < > 102 128 187* 114*  BUN 13  < > 13.0 16.8 11 9   CREATININE 0.93  < > 1.1 1.3 1.03 0.97  CALCIUM 8.3*  < > 9.2 8.9 8.8* 8.1*  GFRNONAA >60  --   --   --  >60 >60  GFRAA >60  --   --   --  >60 >60  PROT  --   < > 7.6 7.5 7.3  --   ALBUMIN  --   < > 3.7 3.7 3.4*  --   AST  --   < > 19 21 29   --   ALT  --   < > <9 <9 12*  --   ALKPHOS  --   < > 100 107 113  --   BILITOT  --   < > 0.72 1.71* 1.8*  --   < > = values in this interval not displayed.  RADIOGRAPHIC STUDIES: I have personally reviewed the radiological images as listed and agreed with the findings in the report. Ct Head Wo Contrast  Result Date: 02/28/2016 CLINICAL DATA:  Posterior neck pain after fall today. No loss of consciousness. EXAM: CT HEAD WITHOUT CONTRAST CT CERVICAL SPINE WITHOUT CONTRAST TECHNIQUE: Multidetector CT imaging of the head and  cervical spine was performed following the standard protocol without intravenous contrast. Multiplanar CT image reconstructions of the cervical spine were also generated. COMPARISON:  CT scan of head of January 12, 2012. FINDINGS: CT HEAD FINDINGS Status post left frontal craniotomy. Otherwise bony calvarium appears intact. Fluid is noted in right mastoid air cells which is unchanged compared to prior exam. Mild diffuse cortical atrophy is noted. Mild chronic ischemic white matter disease is noted. 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 Ventricular size is within normal limits. No midline shift is noted. New focal hyperdensity is noted in right anterior parafalcine region concerning for possible subarachnoid hemorrhage. There is interval development of right frontal  subcortical white matter edema concerning for acute infarction or possible neoplasm. MRI is recommended for further evaluation. CT CERVICAL SPINE FINDINGS No fracture or significant spondylolisthesis is noted. Severe degenerative disc disease is noted at C5-6 and C6-7 with anterior osteophyte formation. Degenerative changes seen involving the posterior facet joints bilaterally. Visualized upper lung fields appear normal. IMPRESSION: Mild diffuse cortical atrophy. Mild chronic ischemic white matter disease. Postsurgical changes seen in left frontal region. There is interval development of right frontal subcortical white matter low density with mass effect concerning for edema ; it is uncertain if this is due to acute infarction or neoplasm. MRI is recommended for further evaluation. Probable right parafalcine subarachnoid hemorrhage is noted as well. Severe multilevel degenerative disc disease is noted. No acute abnormality seen in the cervical spine. Critical Value/emergent results were called by telephone at the time of interpretation on 02/28/2016 at 5:25 pm to Dr. Thurnell Garbe, who verbally acknowledged these results. Electronically Signed   By: Marijo Conception, M.D.   On: 02/28/2016 17:26   Ct Head W Contrast  Result Date: 02/28/2016 CLINICAL DATA:  Follow-up for abnormal head CT. Possible tumor. History is features. EXAM: CT HEAD WITH CONTRAST TECHNIQUE: Contiguous axial images were obtained from the base of the skull through the vertex with intravenous contrast. CONTRAST:  <Contrast> COMPARISON:  Prior CT from earlier same day. FINDINGS: Study moderately degraded by motion artifact. Previously noted area of vasogenic edema within the anterior medial right frontal lobe again seen. Adjacent hyperdensity along the right parafalcine falx, previously questioned to be acute subarachnoid hemorrhage, again seen, although better evaluated on prior study. No significant enhancement appreciated within this region, however,  evaluation fairly limited due to extensive motion artifact. An underlying mass is suspected. There is an additional area of vasogenic edema within the left occipital lobe. Within this area, there is a round enhancing mass measuring 9 mm (series 2, image 12). Given the presence of 2 lesions, findings are concerning for possible metastatic disease. No other definite mass is identified on this motion degraded study. Encephalomalacia involving the anterior inferior left frontal lobe again noted. Atrophy with chronic microvascular ischemic changes are stable. No other acute intracranial process since prior study. No midline shift or hydrocephalus. Scalp soft tissues within normal limits. No acute abnormality about the globes and orbits. Paranasal sinuses and mastoids are stable. Chronic right mastoid effusion noted. Calvarium unchanged. IMPRESSION: 1. 9 mm enhancing mass at the left occipital lobe with surrounding vasogenic edema. 2. Stable vasogenic edema within the anteromedial right frontal lobe with adjacent right parafalcine hyperdensity. No significant enhancement within this region, although evaluation limited by motion artifact on this exam. A second underlying mass is suspected. Given the presence of 2 lesions, findings are highly suspicious for intracranial metastasis. Electronically Signed   By: Jeannine Boga  M.D.   On: 02/28/2016 23:26   Ct Cervical Spine Wo Contrast  Result Date: 02/28/2016 CLINICAL DATA:  Posterior neck pain after fall today. No loss of consciousness. EXAM: CT HEAD WITHOUT CONTRAST CT CERVICAL SPINE WITHOUT CONTRAST TECHNIQUE: Multidetector CT imaging of the head and cervical spine was performed following the standard protocol without intravenous contrast. Multiplanar CT image reconstructions of the cervical spine were also generated. COMPARISON:  CT scan of head of January 12, 2012. FINDINGS: CT HEAD FINDINGS Status post left frontal craniotomy. Otherwise bony calvarium appears  intact. Fluid is noted in right mastoid air cells which is unchanged compared to prior exam. Mild diffuse cortical atrophy is noted. Mild chronic ischemic white matter disease is noted. 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 Ventricular size is within normal limits. No midline shift is noted. New focal hyperdensity is noted in right anterior parafalcine region concerning for possible subarachnoid hemorrhage. There is interval development of right frontal subcortical white matter edema concerning for acute infarction or possible neoplasm. MRI is recommended for further evaluation. CT CERVICAL SPINE FINDINGS No fracture or significant spondylolisthesis is noted. Severe degenerative disc disease is noted at C5-6 and C6-7 with anterior osteophyte formation. Degenerative changes seen involving the posterior facet joints bilaterally. Visualized upper lung fields appear normal. IMPRESSION: Mild diffuse cortical atrophy. Mild chronic ischemic white matter disease. Postsurgical changes seen in left frontal region. There is interval development of right frontal subcortical white matter low density with mass effect concerning for edema ; it is uncertain if this is due to acute infarction or neoplasm. MRI is recommended for further evaluation. Probable right parafalcine subarachnoid hemorrhage is noted as well. Severe multilevel degenerative disc disease is noted. No acute abnormality seen in the cervical spine. Critical Value/emergent results were called by telephone at the time of interpretation on 02/28/2016 at 5:25 pm to Dr. Thurnell Garbe, who verbally acknowledged these results. Electronically Signed   By: Marijo Conception, M.D.   On: 02/28/2016 17:26   Dg Pelvis Portable  Result Date: 02/28/2016 CLINICAL DATA:  Fall.  Dysphasia. EXAM: PORTABLE PELVIS 1-2 VIEWS COMPARISON:  01/08/2016 FINDINGS: Both hips appear located. There is no displaced fracture identified. Mild degenerative changes are noted  involving both hips. IMPRESSION: 1. No acute findings. 2. If there is high clinical suspicion for occult fracture or the patient refuses to weightbear, consider further evaluation with MRI. Although CT is expeditious, evidence is lacking regarding accuracy of CT over plain film radiography. Electronically Signed   By: Kerby Moors M.D.   On: 02/28/2016 15:16   Dg Chest Portable 1 View  Result Date: 02/28/2016 CLINICAL DATA:  Aphasia. EXAM: PORTABLE CHEST 1 VIEW COMPARISON:  Radiographs of August 27, 2015. FINDINGS: The heart size and mediastinal contours are within normal limits. Both lungs are clear. Aortic atherosclerosis is noted. Right-sided Port-A-Cath and left-sided pacemaker are unchanged in position. No pneumothorax or pleural effusion is noted. The visualized skeletal structures are unremarkable. IMPRESSION: No acute cardiopulmonary abnormality seen.  Aortic atherosclerosis. Electronically Signed   By: Marijo Conception, M.D.   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 I would be more concerned about possible recurrence of his high-grade mucoepidermoid carcinoma of the parotid, even though that  was almost 9 years ago. The patient has ongoing risk factor including alcoholism His daughter is very upset and felt that we might have missed this from recent CT imaging. I tried to explain to her that it is not a usual practice to be ordering surveillance brain imaging on patients who are more than 5 years out from prior diagnosis of head and neck cancer I recommend CT scan of the neck, chest, abdomen and pelvis with contrast for full staging information I will consult radiation oncologist on Monday for  possible stereotactic radiosurgery versus whole brain radiation treatment For now, I recommend we continue on high-dose dexamethasone and anti-seizure medications Clinically, he has no signs of focal neurological deficits I will discuss with his primary oncologist next week for further treatment and follow-up  Mild pancytopenia and abnormal liver function tests Significant alcoholism The pancytopenia and abnormal LFT is likely due to recent alcoholism He is placed on thiamine  Discharge planning Not ready to be discharged until after full workup is obtained  I have consulted radiation oncologist & Dr. Alen Blew to follow on Monday If CT neck showed abnormalities, will consult ENT for evaluation  All questions were answered.     [facility], [place], MD 02/29/2016 5:12 PM

## 2016-02-29 NOTE — Progress Notes (Signed)
PROGRESS NOTE  [name]  [id number] DOB: [birth year]  DOA: 02/28/2016 PCP: [name], MD  Primary Cardiologist: Dr. Sanda Klein Primary Oncologist: Dr. Zola Button Primary Urologist: Dr. Kathie Rhodes Primary Neurosurgeon: Dr. Newman Pies.   Brief Narrative:  82 y.o. 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 History of an episode of being found on the floor 4 days prior to current event and an episode of transient speech difficulties 2 days prior to admission. Had witnessed seizure in ED. CT head with contrast showed 2 masses in left occipital lobe and right frontal lobe with vasogenic edema strongly suspicious for metastatic disease. Neurology and medical oncology consulted. Discussed with neurosurgery. Admitted to stepdown for further management.  Assessment & Plan:   Principal Problem:   Seizures ([place]) Active Problems:   Pacemaker   Diabetes mellitus ([place])   Hypertension   Dyslipidemia   ETOH abuse   Permanent atrial fibrillation ([name]   Cancer of renal pelvis ([name]   Coronary artery disease   Thrombocytopenia ([name]   Hematuria   Syncope   Seizures (new onset), and fall at home - What was felt to syncope was likely new onset seizures related to newly diagnosed brain metastasis. - CT head with contrast 02/28/16:9 mm enhancing mass left occipital lobe with vasogenic edema. Stable vasogenic edema right frontal lobe-secondary underlying mass is suspected. Highly suspicious for  intracranial metastasis. - Neurology consultation appreciated. Loaded with IV Keppra and continue 500 mg every 12 hours. - Check EEG - Check with pacemaker company to see if his pacemaker will allow MRI brain to further evaluate currently reported brain lesions and look for other ones. - Started Decadron 10 mg IV 1 followed by 4 mg IV every 6 hours. - Discussed with neurosurgeon on call 9/9: The brain lesions are nonsurgical but may be amenable to stereotactic radiosurgery and recommend oncological evaluation - Oncology consulted 9/9.   Confusion - Possibly multifactorial: Post ictal from seizures in the ED, brain metastases, medications (Ativan). Less likely to be from alcohol withdrawal yet. Possible underlying mild dementia. Rule out ongoing seizures-follow EEG. - Treat underlying cause and monitor.  Dehydration with hyponatremia - Better. Continue IV fluids for additional 24 hours. Started diet per speech therapy recommendations  Hypokalemia - Replace and follow  Hypomagnesemia - replace and follow   Thrombocytopenia - Seems chronic. Stable  Elevated troponin - Unclear etiology.? Demand ischemia. No reported chest pain. - Cycle troponin> mild elevation and mostly flat trend. - ? Related to brain event/seizures  Alcohol dependence - Patient apparently drinks 4 mixed drinks daily. Has had prior history of alcohol withdrawal. - CIWA protocol  Permanent atrial fibrillation/pacemaker - Controlled ventricular rate. Not anticoagulation candidate secondary to previous subdural hematoma. Patient on Plavix at home-currently held secondary to concern for subarachnoid hemorrhage. Decision to be made post MRI regarding resuming > discussed with Neurology 9/9 and awaiting recommendations to resume.  Essential hypertension -Controlled at home off of meds. Monitor without medications for now. Fluctuating  Diet controlled DM - Monitor CBGs and consider SSI if needed > now that  on  Decadron.    TIA history - Plavix decision as above.  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 Intermittent hematuria of concern.  - As per oncology follow-up 01/10/16: Completed 6 cycles of chemotherapy in June 2017. CT scan 01/08/16 reviewed with patient. Excellent response to therapy without any clear-cut residual tumor. Plans for observation and surveillance at that time and you salvage therapy upon progression. Plans to follow up in 3 months from then.  - Now brain lesions suspicious for metastatic disease. Oncologist on-call consulted.   Dysphagia - Started on dysphagia 1 diet and thin liquids per speech therapy.    DVT prophylaxis: SCDs Code Status: Full Family Communication: Called and discussed with patient's daughter Ms. Sherrie in detail. Updated care and answered questions  Disposition Plan: Admitted to SDU. To be determined pending improvement in medical condition.   Consultants:   Neurology  Medical Oncology  Procedures:   None  Antimicrobials:   None    Subjective: Pleasantly confused >thinks that he is at home. Unable to provide any history. No family at bedside. As per RN, no further seizures since ED.   Objective:  Vitals:   02/29/16 0300 02/29/16 0400 02/29/16 0500 02/29/16 0708  BP:  94/72  (!) 180/98  Pulse: 94 (!) 59  62  Resp: 19 15  17   Temp:    100.3 F (37.9 C)  TempSrc:    Axillary  SpO2: 96% 98%    Weight:   90 kg (198 lb 6.4 oz)   Height:        Intake/Output Summary (Last 24 hours) at 02/29/16 1058 Last data filed at 02/29/16 0700  Gross per 24 hour  Intake            872.5 ml  Output                0 ml  Net            872.5 ml   Filed Weights   02/28/16 1422 02/29/16 0500  Weight: 86.2 kg (190 lb) 90 kg (198 lb 6.4 oz)    Examination:  General exam: Moderately built and nourished, chronically ill-looking, lying comfortably supine in bed.  Respiratory  system: Clear to auscultation. Respiratory effort normal. Cardiovascular system: S1 & S2 heard, irregularly irregular. No JVD, murmurs, rubs, gallops or clicks. No pedal edema. Telemetry: A. Fib with CVR and intermittently V paced rhythm.  Gastrointestinal system: Abdomen is nondistended, soft and nontender. No organomegaly or masses felt. Normal bowel sounds heard. Central nervous system: Alert and oriented only to self. No focal neurological deficits. Extremities: Symmetric 5 x 5 power. Mittens on both hands. Extensive bruising at different stages on both upper extremities, some on lower extremities with hyperpigmentation changes of legs. Skin: as above Psychiatry: Judgement and insight appear impaired. Mood & affect : confused but not agitated.     Data Reviewed: I have personally reviewed following labs and imaging studies  CBC:  Recent Labs Lab 02/28/16 1630 02/29/16 0655  WBC 9.0 8.2  NEUTROABS 8.1*  --   HGB 13.2 11.7*  HCT 37.9* 35.4*  MCV 109.5* 110.6*  PLT 109* [id number]*   Basic Metabolic Panel:  Recent Labs Lab 02/28/16 1630 02/29/16 0044 02/29/16 0655  NA 134*  --  136  K 3.2*  --  3.3*  CL 97*  --  99*  CO2 29  --  25  GLUCOSE 187*  --  114*  BUN 11  --  9  CREATININE 1.03  --  0.97  CALCIUM 8.8*  --  8.1*  MG  --  1.2*  --    GFR: Estimated Creatinine Clearance: 62.9 mL/min (by C-G formula based on SCr of 0.97 mg/dL). Liver Function Tests:  Recent Labs Lab 02/28/16 1630  AST 29  ALT 12*  ALKPHOS 113  BILITOT 1.8*  PROT 7.3  ALBUMIN 3.4*   No results for input(s): LIPASE, AMYLASE in the last 168 hours. No results for input(s): AMMONIA in the last 168 hours. Coagulation Profile:  Recent Labs Lab 02/28/16 1630  INR 1.08   Cardiac Enzymes:  Recent Labs Lab 02/28/16 1630 02/29/16 0151 02/29/16 0655  TROPONINI 0.09* 0.15* 0.13*   BNP (last 3 results) No results for input(s): PROBNP in the last 8760 hours. HbA1C: No results for input(s):  HGBA1C in the last 72 hours. CBG:  Recent Labs Lab 02/29/16 0809  GLUCAP 108*   Lipid Profile: No results for input(s): CHOL, HDL, LDLCALC, TRIG, CHOLHDL, LDLDIRECT in the last 72 hours. Thyroid Function Tests: No results for input(s): TSH, T4TOTAL, FREET4, T3FREE, THYROIDAB in the last 72 hours. Anemia Panel: No results for input(s): VITAMINB12, FOLATE, FERRITIN, TIBC, IRON, RETICCTPCT in the last 72 hours.  Sepsis Labs: No results for input(s): PROCALCITON, LATICACIDVEN in the last 168 hours.  No results found for this or any previous visit (from the past 240 hour(s)).       Radiology Studies: Ct Head Wo Contrast  Result Date: 02/28/2016 CLINICAL DATA:  Posterior neck pain after fall today. No loss of consciousness. EXAM: CT HEAD WITHOUT CONTRAST CT CERVICAL SPINE WITHOUT CONTRAST TECHNIQUE: Multidetector CT imaging of the head and cervical spine was performed following the standard protocol without intravenous contrast. Multiplanar CT image reconstructions of the cervical spine were also generated. COMPARISON:  CT scan of head of January 12, 2012. FINDINGS: CT HEAD FINDINGS Status post left frontal craniotomy. Otherwise bony calvarium appears intact. Fluid is noted in right mastoid air cells which is unchanged compared to prior exam. Mild diffuse cortical atrophy is noted. Mild chronic ischemic white matter disease is noted. 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 Ventricular size is within normal limits. No midline shift is noted. New focal hyperdensity is noted in right anterior parafalcine region concerning for possible subarachnoid hemorrhage. There is interval development of right frontal subcortical white matter edema concerning for acute infarction or possible neoplasm. MRI is recommended for further evaluation. CT CERVICAL SPINE FINDINGS No fracture or significant spondylolisthesis is noted. Severe degenerative disc disease is noted at C5-6 and C6-7  with anterior osteophyte formation. Degenerative changes seen involving the posterior facet joints bilaterally. Visualized upper lung fields appear normal. IMPRESSION: Mild diffuse cortical atrophy. Mild chronic ischemic white matter disease. Postsurgical changes seen in left frontal region. There is interval development of right frontal subcortical white matter low density with mass effect concerning for edema ; it is uncertain if this is due to acute infarction or neoplasm. MRI is recommended for further evaluation. Probable right parafalcine subarachnoid hemorrhage is noted as well. Severe multilevel degenerative disc disease is noted. No acute abnormality seen in the cervical spine. Critical Value/emergent results were called by telephone at the time of interpretation on 02/28/2016 at 5:25 pm to Dr. Thurnell Garbe, who verbally acknowledged these results. Electronically Signed   By: Marijo Conception, M.D.   On: 02/28/2016 17:26   Ct Head W Contrast  Result Date: 02/28/2016 CLINICAL  DATA:  Follow-up for abnormal head CT. Possible tumor. History is features. EXAM: CT HEAD WITH CONTRAST TECHNIQUE: Contiguous axial images were obtained from the base of the skull through the vertex with intravenous contrast. CONTRAST:  <Contrast> COMPARISON:  Prior CT from earlier same day. FINDINGS: Study moderately degraded by motion artifact. Previously noted area of vasogenic edema within the anterior medial right frontal lobe again seen. Adjacent hyperdensity along the right parafalcine falx, previously questioned to be acute subarachnoid hemorrhage, again seen, although better evaluated on prior study. No significant enhancement appreciated within this region, however, evaluation fairly limited due to extensive motion artifact. An underlying mass is suspected. There is an additional area of vasogenic edema within the left occipital lobe. Within this area, there is a round enhancing mass measuring 9 mm (series 2, image 12). Given the  presence of 2 lesions, findings are concerning for possible metastatic disease. No other definite mass is identified on this motion degraded study. Encephalomalacia involving the anterior inferior left frontal lobe again noted. Atrophy with chronic microvascular ischemic changes are stable. No other acute intracranial process since prior study. No midline shift or hydrocephalus. Scalp soft tissues within normal limits. No acute abnormality about the globes and orbits. Paranasal sinuses and mastoids are stable. Chronic right mastoid effusion noted. Calvarium unchanged. IMPRESSION: 1. 9 mm enhancing mass at the left occipital lobe with surrounding vasogenic edema. 2. Stable vasogenic edema within the anteromedial right frontal lobe with adjacent right parafalcine hyperdensity. No significant enhancement within this region, although evaluation limited by motion artifact on this exam. A second underlying mass is suspected. Given the presence of 2 lesions, findings are highly suspicious for intracranial metastasis. Electronically Signed   By: Jeannine Boga M.D.   On: 02/28/2016 23:26   Ct Cervical Spine Wo Contrast  Result Date: 02/28/2016 CLINICAL DATA:  Posterior neck pain after fall today. No loss of consciousness. EXAM: CT HEAD WITHOUT CONTRAST CT CERVICAL SPINE WITHOUT CONTRAST TECHNIQUE: Multidetector CT imaging of the head and cervical spine was performed following the standard protocol without intravenous contrast. Multiplanar CT image reconstructions of the cervical spine were also generated. COMPARISON:  CT scan of head of January 12, 2012. FINDINGS: CT HEAD FINDINGS Status post left frontal craniotomy. Otherwise bony calvarium appears intact. Fluid is noted in right mastoid air cells which is unchanged compared to prior exam. Mild diffuse cortical atrophy is noted. Mild chronic ischemic white matter disease is noted. 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 Ventricular size  is within normal limits. No midline shift is noted. New focal hyperdensity is noted in right anterior parafalcine region concerning for possible subarachnoid hemorrhage. There is interval development of right frontal subcortical white matter edema concerning for acute infarction or possible neoplasm. MRI is recommended for further evaluation. CT CERVICAL SPINE FINDINGS No fracture or significant spondylolisthesis is noted. Severe degenerative disc disease is noted at C5-6 and C6-7 with anterior osteophyte formation. Degenerative changes seen involving the posterior facet joints bilaterally. Visualized upper lung fields appear normal. IMPRESSION: Mild diffuse cortical atrophy. Mild chronic ischemic white matter disease. Postsurgical changes seen in left frontal region. There is interval development of right frontal subcortical white matter low density with mass effect concerning for edema ; it is uncertain if this is due to acute infarction or neoplasm. MRI is recommended for further evaluation. Probable right parafalcine subarachnoid hemorrhage is noted as well. Severe multilevel degenerative disc disease is noted. No acute abnormality seen in the cervical spine. Critical Value/emergent results  were called by telephone at the time of interpretation on 02/28/2016 at 5:25 pm to Dr. Thurnell Garbe, who verbally acknowledged these results. Electronically Signed   By: Marijo Conception, M.D.   On: 02/28/2016 17:26   Dg Pelvis Portable  Result Date: 02/28/2016 CLINICAL DATA:  Fall.  Dysphasia. EXAM: PORTABLE PELVIS 1-2 VIEWS COMPARISON:  01/08/2016 FINDINGS: Both hips appear located. There is no displaced fracture identified. Mild degenerative changes are noted involving both hips. IMPRESSION: 1. No acute findings. 2. If there is high clinical suspicion for occult fracture or the patient refuses to weightbear, consider further evaluation with MRI. Although CT is expeditious, evidence is lacking regarding accuracy of CT over plain  film radiography. Electronically Signed   By: Kerby Moors M.D.   On: 02/28/2016 15:16   Dg Chest Portable 1 View  Result Date: 02/28/2016 CLINICAL DATA:  Aphasia. EXAM: PORTABLE CHEST 1 VIEW COMPARISON:  Radiographs of August 27, 2015. FINDINGS: The heart size and mediastinal contours are within normal limits. Both lungs are clear. Aortic atherosclerosis is noted. Right-sided Port-A-Cath and left-sided pacemaker are unchanged in position. No pneumothorax or pleural effusion is noted. The visualized skeletal structures are unremarkable. IMPRESSION: No acute cardiopulmonary abnormality seen.  Aortic atherosclerosis. Electronically Signed   By: Marijo Conception, M.D.   On: 02/28/2016 15:16        Scheduled Meds: . dexamethasone  4 mg Intravenous Q6H  . famotidine  20 mg Oral BID  . folic acid  1 mg Oral Daily  . levETIRAcetam  500 mg Intravenous Q12H  . magnesium sulfate 1 - 4 g bolus IVPB  4 g Intravenous Once  . mouth rinse  15 mL Mouth Rinse BID  . multivitamin with minerals  1 tablet Oral Daily  . pantoprazole  40 mg Oral Daily  . sodium chloride flush  10-40 mL Intracatheter Q12H  . sodium chloride flush  3 mL Intravenous Q12H  . thiamine  100 mg Oral Daily   Or  . thiamine  100 mg Intravenous Daily   Continuous Infusions: . 0.9 % NaCl with KCl 40 mEq / L 50 mL/hr (02/29/16 1025)     LOS: 0 days    Time spent: 45 minutes.    [facility], MD [facility] Pager [phone]  If 7PM-7AM, please contact night-coverage [email or web address] Password [email or web address] 02/29/2016, 10:58 AM

## 2016-02-29 NOTE — Progress Notes (Signed)
Patient ID: SUAN LINSEY, male   DOB: [birth year], 82 y.o.   MRN: [id number] Called for film evaluation of Mr. Frisbie who is an 82 year old gentleman with a history of transitional carcinoma with newly diagnosed metastatic disease to the brain. CT scan appears to have 2 lesions right frontal and left occipital both very small less than a centimeter both are nonsurgical however may be amenable to stereotactic radiosurgery. At this point I would proceed forward oncologic evaluation and staging of disease and if that is the step systemic disease is controlled referred to [facility] and radiation oncology for stereotactic radiosurgery. I'm happy to assist and treatment or patient had been seen for a subdural hematoma with Dr. Newman Pies and he could also a cyst. Would start by coordinating with radiation oncology and if the brain tumor team agrees with radiosurgery we can coordinate.

## 2016-03-01 ENCOUNTER — Inpatient Hospital Stay (HOSPITAL_COMMUNITY): Payer: Medicare Other

## 2016-03-01 DIAGNOSIS — E876 Hypokalemia: Secondary | ICD-10-CM

## 2016-03-01 DIAGNOSIS — R079 Chest pain, unspecified: Secondary | ICD-10-CM

## 2016-03-01 LAB — BASIC METABOLIC PANEL
Anion gap: 5 (ref 5–15)
BUN: 12 mg/dL (ref 6–20)
CO2: 24 mmol/L (ref 22–32)
Calcium: 8.2 mg/dL — ABNORMAL LOW (ref 8.9–10.3)
Chloride: 104 mmol/L (ref 101–111)
Creatinine, Ser: 0.96 mg/dL (ref 0.61–1.24)
GFR calc Af Amer: >60 mL/min (ref >60)
GLUCOSE: 215 mg/dL — AB (ref 65–99)
POTASSIUM: 4.5 mmol/L (ref 3.5–5.1)
Sodium: 133 mmol/L — ABNORMAL LOW (ref 135–145)

## 2016-03-01 LAB — CBC
HEMATOCRIT: 35.2 % — AB (ref 39.0–52.0)
Hemoglobin: 12 g/dL — ABNORMAL LOW (ref 13.0–17.0)
MCH: 37.9 pg — AB (ref 26.0–34.0)
MCHC: 34.1 g/dL (ref 30.0–36.0)
MCV: 111 fL — AB (ref 78.0–100.0)
PLATELETS: 106 10*3/uL — AB (ref 150–400)
RBC: 3.17 MIL/uL — AB (ref 4.22–5.81)
RDW: 12.7 % (ref 11.5–15.5)
WBC: 8.5 10*3/uL (ref 4.0–10.5)

## 2016-03-01 LAB — ECHOCARDIOGRAM COMPLETE
Height: 69 in
WEIGHTICAEL: 3059.2 [oz_av]

## 2016-03-01 LAB — GLUCOSE, CAPILLARY
GLUCOSE-CAPILLARY: 171 mg/dL — AB (ref 65–99)
GLUCOSE-CAPILLARY: 211 mg/dL — AB (ref 65–99)

## 2016-03-01 LAB — MAGNESIUM: Magnesium: 1.9 mg/dL (ref 1.7–2.4)

## 2016-03-01 MED ORDER — IOPAMIDOL (ISOVUE-300) INJECTION 61%
INTRAVENOUS | Status: AC
  Filled 2016-03-01: qty 30

## 2016-03-01 MED ORDER — FAMOTIDINE IN NACL 20-0.9 MG/50ML-% IV SOLN
20.0000 mg | Freq: Two times a day (BID) | INTRAVENOUS | Status: DC
  Administered 2016-03-01 – 2016-03-02 (×2): 20 mg via INTRAVENOUS
  Filled 2016-03-01 (×2): qty 50

## 2016-03-01 MED ORDER — SODIUM CHLORIDE 0.9 % IV SOLN
500.0000 mg | Freq: Two times a day (BID) | INTRAVENOUS | Status: DC
  Administered 2016-03-01 – 2016-03-02 (×2): 500 mg via INTRAVENOUS
  Filled 2016-03-01 (×5): qty 5

## 2016-03-01 MED ORDER — INSULIN ASPART 100 UNIT/ML ~~LOC~~ SOLN: 0.0000 [IU] | Freq: Every day | SUBCUTANEOUS | Status: DC

## 2016-03-01 MED ORDER — IOPAMIDOL (ISOVUE-300) INJECTION 61%
INTRAVENOUS | Status: AC
  Administered 2016-03-01: 100 mL
  Filled 2016-03-01: qty 100

## 2016-03-01 MED ORDER — INSULIN ASPART 100 UNIT/ML ~~LOC~~ SOLN
0.0000 [IU] | Freq: Three times a day (TID) | SUBCUTANEOUS | Status: DC
  Administered 2016-03-01 (×2): 3 [IU] via SUBCUTANEOUS
  Administered 2016-03-02 (×2): 2 [IU] via SUBCUTANEOUS
  Administered 2016-03-02: 5 [IU] via SUBCUTANEOUS
  Administered 2016-03-03 (×2): 2 [IU] via SUBCUTANEOUS
  Administered 2016-03-03: 3 [IU] via SUBCUTANEOUS
  Administered 2016-03-04: 2 [IU] via SUBCUTANEOUS
  Administered 2016-03-04: 3 [IU] via SUBCUTANEOUS

## 2016-03-01 MED ORDER — LEVETIRACETAM 500 MG PO TABS: 500.0000 mg | ORAL_TABLET | Freq: Two times a day (BID) | ORAL | Status: DC

## 2016-03-01 NOTE — Progress Notes (Signed)
PROGRESS NOTE  [name]  [id number] DOB: 10/09/1929  DOA: 02/28/2016 PCP: [name], MD  Primary Cardiologist: Dr. Sanda Klein Primary Oncologist: Dr. Zola Button Primary Urologist: Dr. Kathie Rhodes Primary Neurosurgeon: Dr. Newman Pies.   Brief Narrative:  82 y.o. male, single, his daughter [name] lives with him, ambulates with the help of a cane and walker, PMH of atrial fibrillation not on anticoagulation due to previous subdural hematoma, pacemaker implanted 1990 for sinus node dysfunction, mild-to-moderate degenerative aortic stenosis, subdural hematoma, diet controlled diabetes, transitional cell carcinoma with intermittent hematuria, s/p chemotherapy, TIA, HLD, HTN, presented to [facility] ED on 02/28/16 following an episode of passing out at home and was found on the floor by his daughter. History of an episode of being found on the floor 4 days prior to current event and an episode of transient speech difficulties 2 days prior to admission. Had witnessed seizure in ED. CT head with contrast showed 2 masses in left occipital lobe and right frontal lobe with vasogenic edema strongly suspicious for metastatic disease. Neurology and medical oncology consulted. Discussed with neurosurgery. Admitted to stepdown for further management.  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 - CT head with contrast 02/28/16:9 mm enhancing mass left occipital lobe with vasogenic edema. Stable vasogenic edema right frontal lobe-secondary underlying mass is suspected. Highly suspicious for  intracranial metastasis. - Repeat head CT 02/29/16:1 cm left occipital high-density 8 year is unchanged from prior. Parasagittal right frontal and left occipital parenchymal edema which could be cytotoxic or vasogenic. As per neurology follow-up, he has a mild subarachnoid or subdural hematoma along the falx. - Neurology consultation appreciated. Loaded with IV Keppra and continue 500 mg every 12 hours. - EEG 02/29/16: Normal without seizures or seizure predisposition. - Patient's pacemaker prevents [place] from getting MRI of brain (checked with [employer] on 9/9) - Started Decadron 10 mg IV 1 followed by 4 mg IV every 6 hours. - Discussed with neurosurgeon on call 9/9: The brain lesions are nonsurgical but may be amenable to stereotactic radiosurgery and recommend oncological evaluation - Oncology consultation 9/9 appreciated: Highly unusual to see transitional cell carcinoma/urothelial cell carcinoma with brain metastasis although not impossible. 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   Delirium - Possibly multifactorial: Post ictal from seizures in the ED, brain metases, medications (Ativan). Less likely to be from alcohol withdrawal yet. Possible underlying mild dementia.  - Treat underlying cause and monitor. - Improving slowly  Dehydration with hyponatremia - Started diet per speech therapy recommendations - Dehydration resolved. Hyponatremia stable. DC IV fluids.   Hypokalemia - Replaced  Hypomagnesemia - replaced  Thrombocytopenia and anemia - Seems chronic. Stable  Elevated troponin - Unclear etiology.? Demand ischemia. No reported chest pain. - Cycle troponin> mild elevation and mostly flat trend. - ? Related to brain event/seizures  Alcohol dependence - Patient apparently drinks 4 mixed drinks daily. Has had prior history of alcohol withdrawal. - CIWA protocol  Permanent atrial fibrillation/pacemaker -  Controlled ventricular rate.  Not anticoagulation candidate secondary to previous subdural hematoma. Patient on Plavix at home-as per neurology recommendations, hold Plavix for 1 week.   Essential hypertension -Controlled at home off of meds. Monitor without medications for now. Fluctuating. Start low-dose amlodipine.   Diet controlled DM - Monitor CBGs and started SSI. Marland Kitchen    TIA history - Plavix held due to subarachnoid/subdural hematoma.  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 Intermittent hematuria of concern.  - As per oncology follow-up 01/10/16: Completed 6 cycles of chemotherapy in June 2017. CT scan 01/08/16 reviewed with patient. Excellent response to therapy without any clear-cut residual tumor. Plans for observation and surveillance at that time and you salvage therapy upon progression. Plans to follow up in 3 months from then.  - Now brain lesions suspicious for metastatic disease. s per oncology consultation, suspect metastasis from head and neck cancer and not transitional cell cancer and workup as per their recommendations.  Dysphagia - Started on dysphagia 1 diet and thin liquids per speech therapy.    DVT prophylaxis: SCDs Code Status: Full Family Communication: None at bedside Disposition Plan: Admitted to SDU. To be determined pending improvement in medical condition.   Consultants:   Neurology  Medical Oncology  Procedures:   None  Antimicrobials:   None    Subjective: Less confused this morning. Oriented to self, place and partly to time. Denies complaints.  Objective:  Vitals:   02/29/16 2344 03/01/16 0400 03/01/16 0500 03/01/16 0800  BP: (!) 169/79 (!) 157/74  (!) 161/139  Pulse: (!) 111 (!) 59  (!) 59  Resp: 20 14    Temp: 98.7 F (37.1 C) 98.3 F (36.8 C)  97.4 F (36.3 C)  TempSrc: Axillary Axillary  Axillary  SpO2: 92% 100%  100%  Weight:   86.7 kg (191 lb 3.2 oz)    Height:        Intake/Output Summary (Last 24 hours) at 03/01/16 1234 Last data filed at 03/01/16 1022  Gross per 24 hour  Intake          1379.16 ml  Output              725 ml  Net           654.16 ml   Filed Weights   02/28/16 1422 02/29/16 0500 03/01/16 0500  Weight: 86.2 kg (190 lb) 90 kg (198 lb 6.4 oz) 86.7 kg (191 lb 3.2 oz)    Examination:  General exam: Moderately built and nourished, chronically ill-looking, lying comfortably supine in bed. Looks better than he did over the last 2 days.  Respiratory system: Clear to auscultation. Respiratory effort normal. Cardiovascular system: S1 & S2 heard, irregularly irregular. No JVD, murmurs, rubs, gallops or clicks. No pedal edema. Telemetry: A. Fib with CVR and intermittently V paced rhythm.  Gastrointestinal system: Abdomen is nondistended, soft and nontender. No organomegaly or masses felt. Normal bowel sounds heard. Central nervous system: Alert and oriented only to self, place and partly to time. No focal neurological deficits. Extremities: Symmetric 5 x 5 power. Mittens on both hands. Extensive bruising at different stages on both upper extremities, some on lower extremities with hyperpigmentation changes of legs. Skin: as above Psychiatry: Judgement and insight appear impaired. Mood & affect : confused but not agitated.     Data Reviewed: I have personally reviewed following labs and imaging studies  CBC:  Recent Labs Lab 02/28/16 1630 02/29/16 [id number]  03/01/16 0213  WBC 9.0 8.2 8.5  NEUTROABS 8.1*  --   --   HGB 13.2 11.7* 12.0*  HCT 37.9* 35.4* 35.2*  MCV 109.5* 110.6* 111.0*  PLT 109* 107* [id number]*   Basic Metabolic Panel:  Recent Labs Lab 02/28/16 1630 02/29/16 0044 02/29/16 0655 03/01/16 0213  NA 134*  --  136 133*  K 3.2*  --  3.3* 4.5  CL 97*  --  99* 104  CO2 29  --  25 24  GLUCOSE 187*  --  114* 215*  BUN 11  --  9 12  CREATININE 1.03  --  0.97 0.96  CALCIUM 8.8*  --  8.1* 8.2*  MG  --  1.2*   --  1.9   GFR: Estimated Creatinine Clearance: 62.5 mL/min (by C-G formula based on SCr of 0.96 mg/dL). Liver Function Tests:  Recent Labs Lab 02/28/16 1630  AST 29  ALT 12*  ALKPHOS 113  BILITOT 1.8*  PROT 7.3  ALBUMIN 3.4*   No results for input(s): LIPASE, AMYLASE in the last 168 hours. No results for input(s): AMMONIA in the last 168 hours. Coagulation Profile:  Recent Labs Lab 02/28/16 1630  INR 1.08   Cardiac Enzymes:  Recent Labs Lab 02/28/16 1630 02/29/16 0151 02/29/16 0655 02/29/16 1158  TROPONINI 0.09* 0.15* 0.13* 0.09*   BNP (last 3 results) No results for input(s): PROBNP in the last 8760 hours. HbA1C: No results for input(s): HGBA1C in the last 72 hours. CBG:  Recent Labs Lab 02/29/16 0809 02/29/16 2117 03/01/16 0813 03/01/16 1227  GLUCAP 108* 254* 171* 211*   Lipid Profile: No results for input(s): CHOL, HDL, LDLCALC, TRIG, CHOLHDL, LDLDIRECT in the last 72 hours. Thyroid Function Tests: No results for input(s): TSH, T4TOTAL, FREET4, T3FREE, THYROIDAB in the last 72 hours. Anemia Panel: No results for input(s): VITAMINB12, FOLATE, FERRITIN, TIBC, IRON, RETICCTPCT in the last 72 hours.  Sepsis Labs: No results for input(s): PROCALCITON, LATICACIDVEN in the last 168 hours.  Recent Results (from the past 240 hour(s))  MRSA PCR Screening     Status: Abnormal   Collection Time: 02/29/16 10:33 AM  Result Value Ref Range Status   MRSA by PCR POSITIVE (A) NEGATIVE Final    Comment:        The GeneXpert MRSA Assay (FDA approved for NASAL specimens only), is one component of a comprehensive MRSA colonization surveillance program. It is not intended to diagnose MRSA infection nor to guide or monitor treatment for MRSA infections. RESULT CALLED TO, READ BACK BY AND VERIFIED WITH: [facility] RN AT 1242 02/29/16 BY A.[name]          Radiology Studies: Ct Head Wo Contrast  Result Date: 02/29/2016 CLINICAL DATA:  Altered mental status.  EXAM: CT HEAD WITHOUT CONTRAST TECHNIQUE: Contiguous axial images were obtained from the base of the skull through the vertex without intravenous contrast. COMPARISON:  Yesterday FINDINGS: Brain: Study again degraded by motion, requiring repeat acquisitions. There are 2 areas of low-density that are stable from previous, parasagittal right frontal and left occipital at the atrium of the left lateral ventricle. There is a 1 cm rounded high-density area in the left occipital region which is stable compared to yesterday's postcontrast study. High-density was also subtly seen on precontrast scan from the same day. Superficial, likely subarachnoid high-density in the anterior interhemispheric fissure. Small bilateral cerebral convexity subdural hygromas without significant mass effect. 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 Chronic small-vessel ischemic  change in the cerebral white matter. Vascular: Atherosclerotic calcifications. Skull: Remote left posterior frontal craniotomy. No acute or aggressive process. Sinuses/Orbits: Chronic right mastoid effusion, present since at least 2013. Negative nasopharynx. IMPRESSION: 1. Stable, limited exam. 2. 1 cm left occipital high density area is unchanged from prior, potentially hemorrhage or mineralization rather than enhancement suspected yesterday. Recommend continued close follow-up. 3. Stable small hemorrhage in the anterior interhemispheric fissure without mass effect 4. Parasagittal right frontal and left occipital parenchymal edema which could be cytotoxic or vasogenic. Electronically Signed   By: Monte Fantasia M.D.   On: 02/29/2016 17:32   Ct Head Wo Contrast  Result Date: 02/28/2016 CLINICAL DATA:  Posterior neck pain after fall today. No loss of consciousness. EXAM: CT HEAD WITHOUT CONTRAST CT CERVICAL SPINE WITHOUT CONTRAST TECHNIQUE: Multidetector CT imaging of the head and cervical spine was performed following the  standard protocol without intravenous contrast. Multiplanar CT image reconstructions of the cervical spine were also generated. COMPARISON:  CT scan of head of January 12, 2012. FINDINGS: CT HEAD FINDINGS Status post left frontal craniotomy. Otherwise bony calvarium appears intact. Fluid is noted in right mastoid air cells which is unchanged compared to prior exam. Mild diffuse cortical atrophy is noted. Mild chronic ischemic white matter disease is noted. 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 Ventricular size is within normal limits. No midline shift is noted. New focal hyperdensity is noted in right anterior parafalcine region concerning for possible subarachnoid hemorrhage. There is interval development of right frontal subcortical white matter edema concerning for acute infarction or possible neoplasm. MRI is recommended for further evaluation. CT CERVICAL SPINE FINDINGS No fracture or significant spondylolisthesis is noted. Severe degenerative disc disease is noted at C5-6 and C6-7 with anterior osteophyte formation. Degenerative changes seen involving the posterior facet joints bilaterally. Visualized upper lung fields appear normal. IMPRESSION: Mild diffuse cortical atrophy. Mild chronic ischemic white matter disease. Postsurgical changes seen in left frontal region. There is interval development of right frontal subcortical white matter low density with mass effect concerning for edema ; it is uncertain if this is due to acute infarction or neoplasm. MRI is recommended for further evaluation. Probable right parafalcine subarachnoid hemorrhage is noted as well. Severe multilevel degenerative disc disease is noted. No acute abnormality seen in the cervical spine. Critical Value/emergent results were called by telephone at the time of interpretation on 02/28/2016 at 5:25 pm to Dr. Thurnell Garbe, who verbally acknowledged these results. Electronically Signed   By: Marijo Conception, M.D.    On: 02/28/2016 17:26   Ct Head W Contrast  Result Date: 02/28/2016 CLINICAL DATA:  Follow-up for abnormal head CT. Possible tumor. History is features. EXAM: CT HEAD WITH CONTRAST TECHNIQUE: Contiguous axial images were obtained from the base of the skull through the vertex with intravenous contrast. CONTRAST:  <Contrast> COMPARISON:  Prior CT from earlier same day. FINDINGS: Study moderately degraded by motion artifact. Previously noted area of vasogenic edema within the anterior medial right frontal lobe again seen. Adjacent hyperdensity along the right parafalcine falx, previously questioned to be acute subarachnoid hemorrhage, again seen, although better evaluated on prior study. No significant enhancement appreciated within this region, however, evaluation fairly limited due to extensive motion artifact. An underlying mass is suspected. There is an additional area of vasogenic edema within the left occipital lobe. Within this area, there is a round enhancing mass measuring 9 mm (series 2, image 12). Given the presence of 2 lesions, findings are concerning for  possible metastatic disease. No other definite mass is identified on this motion degraded study. Encephalomalacia involving the anterior inferior left frontal lobe again noted. Atrophy with chronic microvascular ischemic changes are stable. No other acute intracranial process since prior study. No midline shift or hydrocephalus. Scalp soft tissues within normal limits. No acute abnormality about the globes and orbits. Paranasal sinuses and mastoids are stable. Chronic right mastoid effusion noted. Calvarium unchanged. IMPRESSION: 1. 9 mm enhancing mass at the left occipital lobe with surrounding vasogenic edema. 2. Stable vasogenic edema within the anteromedial right frontal lobe with adjacent right parafalcine hyperdensity. No significant enhancement within this region, although evaluation limited by motion artifact on this exam. A second underlying  mass is suspected. Given the presence of 2 lesions, findings are highly suspicious for intracranial metastasis. Electronically Signed   By: Jeannine Boga M.D.   On: 02/28/2016 23:26   Ct Cervical Spine Wo Contrast  Result Date: 02/28/2016 CLINICAL DATA:  Posterior neck pain after fall today. No loss of consciousness. EXAM: CT HEAD WITHOUT CONTRAST CT CERVICAL SPINE WITHOUT CONTRAST TECHNIQUE: Multidetector CT imaging of the head and cervical spine was performed following the standard protocol without intravenous contrast. Multiplanar CT image reconstructions of the cervical spine were also generated. COMPARISON:  CT scan of head of January 12, 2012. FINDINGS: CT HEAD FINDINGS Status post left frontal craniotomy. Otherwise bony calvarium appears intact. Fluid is noted in right mastoid air cells which is unchanged compared to prior exam. Mild diffuse cortical atrophy is noted. Mild chronic ischemic white matter disease is noted. 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 Ventricular size is within normal limits. No midline shift is noted. New focal hyperdensity is noted in right anterior parafalcine region concerning for possible subarachnoid hemorrhage. There is interval development of right frontal subcortical white matter edema concerning for acute infarction or possible neoplasm. MRI is recommended for further evaluation. CT CERVICAL SPINE FINDINGS No fracture or significant spondylolisthesis is noted. Severe degenerative disc disease is noted at C5-6 and C6-7 with anterior osteophyte formation. Degenerative changes seen involving the posterior facet joints bilaterally. Visualized upper lung fields appear normal. IMPRESSION: Mild diffuse cortical atrophy. Mild chronic ischemic white matter disease. Postsurgical changes seen in left frontal region. There is interval development of right frontal subcortical white matter low density with mass effect concerning for edema ; it is  uncertain if this is due to acute infarction or neoplasm. MRI is recommended for further evaluation. Probable right parafalcine subarachnoid hemorrhage is noted as well. Severe multilevel degenerative disc disease is noted. No acute abnormality seen in the cervical spine. Critical Value/emergent results were called by telephone at the time of interpretation on 02/28/2016 at 5:25 pm to Dr. Thurnell Garbe, who verbally acknowledged these results. Electronically Signed   By: Marijo Conception, M.D.   On: 02/28/2016 17:26   Dg Pelvis Portable  Result Date: 02/28/2016 CLINICAL DATA:  Fall.  Dysphasia. EXAM: PORTABLE PELVIS 1-2 VIEWS COMPARISON:  01/08/2016 FINDINGS: Both hips appear located. There is no displaced fracture identified. Mild degenerative changes are noted involving both hips. IMPRESSION: 1. No acute findings. 2. If there is high clinical suspicion for occult fracture or the patient refuses to weightbear, consider further evaluation with MRI. Although CT is expeditious, evidence is lacking regarding accuracy of CT over plain film radiography. Electronically Signed   By: Kerby Moors M.D.   On: 02/28/2016 15:16   Dg Chest Portable 1 View  Result Date: 02/28/2016 CLINICAL DATA:  Aphasia. EXAM:  PORTABLE CHEST 1 VIEW COMPARISON:  Radiographs of August 27, 2015. FINDINGS: The heart size and mediastinal contours are within normal limits. Both lungs are clear. Aortic atherosclerosis is noted. Right-sided Port-A-Cath and left-sided pacemaker are unchanged in position. No pneumothorax or pleural effusion is noted. The visualized skeletal structures are unremarkable. IMPRESSION: No acute cardiopulmonary abnormality seen.  Aortic atherosclerosis. Electronically Signed   By: Marijo Conception, M.D.   On: 02/28/2016 15:16        Scheduled Meds: . Chlorhexidine Gluconate Cloth  6 each Topical Q0600  . dexamethasone  4 mg Intravenous Q6H  . famotidine  20 mg Oral BID  . folic acid  1 mg Oral Daily  . insulin aspart   0-5 Units Subcutaneous QHS  . insulin aspart  0-9 Units Subcutaneous TID WC  . iopamidol      . levETIRAcetam  500 mg Intravenous Q12H  . mouth rinse  15 mL Mouth Rinse BID  . multivitamin with minerals  1 tablet Oral Daily  . mupirocin ointment  1 application Nasal BID  . pantoprazole  40 mg Oral Daily  . sodium chloride flush  10-40 mL Intracatheter Q12H  . sodium chloride flush  3 mL Intravenous Q12H  . thiamine  100 mg Oral Daily   Continuous Infusions:     LOS: 1 day    Time spent: 25 minutes.    [facility], MD [facility] Pager [phone]  If 7PM-7AM, please contact night-coverage [email or web address] Password [facility] 03/01/2016, 12:34 PM

## 2016-03-01 NOTE — Progress Notes (Signed)
  Echocardiogram 2D Echocardiogram has been performed.  [name] 03/01/2016, 2:31 PM

## 2016-03-01 NOTE — Progress Notes (Signed)
Subjective:  no further seizure, continues to be delirious.   Exam: Vitals:   03/01/16 0400 03/01/16 0800  BP: (!) 157/74 (!) 161/139  Pulse: (!) 59 (!) 59  Resp: 14   Temp: 98.3 F (36.8 C) 97.4 F (36.3 C)   Gen: In bed, NAD Resp: non-labored breathing, no acute distress Abd: soft, nt  Neuro: MS: lethargic but follows commands. Oriented to self but not place (thinks he is at home) CN: Pupils equal round and reactive to light, extraocular movements intact Motor: Moves all extremities well Sensory: Intact to light touch    Impression: 82 yo M with new onset seizures found have metastatic lesions in the brain. He continues to be confused, likely a combination of postictal effect, delirium. It does appear that he has a mild subarachnoid or small subdural hematoma along the falx. He will need repeat imaging.  Recommendations: 1) agree with starting steroids 2) Keppra 500 mg twice a day 3) hold Plavix for one week 4) appreciate nsgy and oncology  [name], MD [facility] [phone]  If 7pm- 7am, please page neurology on call as listed in [place].

## 2016-03-02 ENCOUNTER — Ambulatory Visit
Admit: 2016-03-02 | Discharge: 2016-03-02 | Disposition: A | Discharge status: Home / Self Care | Payer: Medicare Other | Attending: Radiation Oncology | Admitting: Radiation Oncology

## 2016-03-02 DIAGNOSIS — C669 Malignant neoplasm of unspecified ureter: Secondary | ICD-10-CM

## 2016-03-02 DIAGNOSIS — R569 Unspecified convulsions: Principal | ICD-10-CM

## 2016-03-02 DIAGNOSIS — G939 Disorder of brain, unspecified: Secondary | ICD-10-CM

## 2016-03-02 DIAGNOSIS — R609 Edema, unspecified: Secondary | ICD-10-CM

## 2016-03-02 DIAGNOSIS — R9089 Other abnormal findings on diagnostic imaging of central nervous system: Secondary | ICD-10-CM

## 2016-03-02 LAB — GLUCOSE, CAPILLARY
GLUCOSE-CAPILLARY: 204 mg/dL — AB (ref 65–99)
GLUCOSE-CAPILLARY: 248 mg/dL — AB (ref 65–99)
Glucose-Capillary: 187 mg/dL — ABNORMAL HIGH (ref 65–99)
Glucose-Capillary: 255 mg/dL — ABNORMAL HIGH (ref 65–99)
Glucose-Capillary: 176 mg/dL — ABNORMAL HIGH (ref 65–99)
Glucose-Capillary: 254 mg/dL — ABNORMAL HIGH (ref 65–99)

## 2016-03-02 MED ORDER — DEXAMETHASONE 4 MG PO TABS
4.0000 mg | ORAL_TABLET | Freq: Three times a day (TID) | ORAL | Status: DC
  Administered 2016-03-02 – 2016-03-03 (×2): 4 mg via ORAL
  Filled 2016-03-02 (×4): qty 1

## 2016-03-02 MED ORDER — IOPAMIDOL (ISOVUE-300) INJECTION 61%
50.0000 mL | Freq: Once | INTRAVENOUS | Status: AC | PRN
  Administered 2016-02-28: 50 mL via INTRAVENOUS

## 2016-03-02 MED ORDER — HYDROCODONE-ACETAMINOPHEN 5-325 MG PO TABS: 1.0000 | ORAL_TABLET | Freq: Three times a day (TID) | ORAL | Status: DC | PRN

## 2016-03-02 MED ORDER — AMLODIPINE BESYLATE 2.5 MG PO TABS
2.5000 mg | ORAL_TABLET | Freq: Every day | ORAL | Status: DC
  Administered 2016-03-02 – 2016-03-03 (×2): 2.5 mg via ORAL
  Filled 2016-03-02 (×2): qty 1

## 2016-03-02 MED ORDER — FENTANYL CITRATE (PF) 100 MCG/2ML IJ SOLN: 12.5000 ug | Freq: Three times a day (TID) | INTRAMUSCULAR | Status: DC | PRN

## 2016-03-02 NOTE — Evaluation (Signed)
Occupational Therapy Evaluation Patient Details Name: [name] MRN: [id number] DOB: 26-Jun-1929 Today's Date: 03/02/2016    History of Present Illness Patient is an 82 yo male admitted 02/28/16 after being found on floor by daughter.  Patient with similar syncopal episode several days prior.  Patient with new seizures, metastatic brain lesions, and delerium.    PMH:  Afib, SDH with craniotomy, pacemaker, DM, transitional cell CA, TIA, HTN, ETOH use, CAD   Clinical Impression   Pt admitted with above. He demonstrates the below listed deficits and will benefit from continued OT to maximize safety and independence with BADLs.  Pt presents to OT with generalized weakness, impaired balance, impaired cognition.  He is able to perform ADLs with min A.  At present feel short term SNF level rehab is best option, however, if he progresses well during hospital stay, and daughter is able to provide 24 hour supervision/assist, he may be able to discharge home with [facility] services       Follow Up Recommendations  SNF    Equipment Recommendations  None recommended by OT    Recommendations for Other Services       Precautions / Restrictions Precautions Precautions: Fall Precaution Comments: syncope and seizures Restrictions Weight Bearing Restrictions: No      Mobility Bed Mobility Overal bed mobility: Needs Assistance;+ 2 for safety/equipment Bed Mobility: Rolling;Sidelying to Sit Rolling: Min assist Sidelying to sit: Mod assist;+2 for safety/equipment;HOB elevated       General bed mobility comments: Pt sitting up in chair   Transfers Overall transfer level: Needs assistance Equipment used: 1 person hand held assist Transfers: Sit to/from [facility] Sit to Stand: Min assist Stand pivot transfers: Min assist       General transfer comment: min A to steady     Balance Overall balance assessment: Needs assistance Sitting-balance support: Feet  supported Sitting balance-Leahy Scale: Good     Standing balance support: No upper extremity supported Standing balance-Leahy Scale: Fair Standing balance comment: worked on reaching in standing with min guard - min A                             ADL Overall ADL's : Needs assistance/impaired Eating/Feeding: Supervision/ safety;Sitting   Grooming: Wash/dry hands;Wash/dry face;Oral care;Brushing hair;Set up;Supervision/safety;Sitting   Upper Body Bathing: Supervision/ safety;Sitting   Lower Body Bathing: Minimal assistance;Sit to/from stand   Upper Body Dressing : Minimal assistance;Sitting   Lower Body Dressing: Minimal assistance;Sit to/from stand   Toilet Transfer: Minimal assistance;Stand-pivot;BSC   Toileting- Clothing Manipulation and Hygiene: Minimal assistance;Sit to/from stand       Functional mobility during ADLs: Minimal assistance General ADL Comments: Pt requires assist for balance and safety      Vision     Perception     Praxis      Pertinent Vitals/Pain Pain Assessment: No/denies pain     Hand Dominance Right   Extremity/Trunk Assessment Upper Extremity Assessment Upper Extremity Assessment: Generalized weakness   Lower Extremity Assessment Lower Extremity Assessment: Defer to PT evaluation       Communication Communication Communication: HOH   Cognition Arousal/Alertness: Awake/alert Behavior During Therapy: WFL for tasks assessed/performed Overall Cognitive Status: Impaired/Different from baseline Area of Impairment: Attention;Following commands;Safety/judgement;Problem solving Orientation Level: Disoriented to;Situation;Time (knew month) Current Attention Level: Sustained Memory: Decreased short-term memory Following Commands: Follows one step commands consistently Safety/Judgement: Decreased awareness of safety   Problem Solving: Requires verbal cues General Comments:  Difficult to accurately assess due to severity of  hearing deficits    General Comments       Exercises       Shoulder Instructions      Home Living Family/patient expects to be discharged to:: Private residence Living Arrangements: Children Available Help at Discharge: Family Type of Home: House Home Access: Stairs to enter Technical brewer of Steps: 2 Entrance Stairs-Rails: Right Home Layout: One level     Bathroom Shower/Tub: Tub/shower unit Shower/tub characteristics: Curtain Biochemist, clinical: Standard Bathroom Accessibility: Yes   Home Equipment: Environmental consultant - 2 wheels;Cane - single point;Grab bars - tub/shower;Shower seat;Adaptive equipment Adaptive Equipment: Sock aid        Prior Functioning/Environment Level of Independence: Needs assistance  Gait / Transfers Assistance Needed: ambulatory with cane/walker at baseline ADL's / Homemaking Assistance Needed: Pt uses sock aid.  He requires assist for IADLs         OT Diagnosis: Generalized weakness;Cognitive deficits   OT Problem List: Decreased strength;Decreased activity tolerance;Impaired balance (sitting and/or standing);Decreased cognition;Decreased safety awareness;Decreased knowledge of use of DME or AE   OT Treatment/Interventions: Self-care/ADL training;Therapeutic exercise;Energy conservation;DME and/or AE instruction;Therapeutic activities;Cognitive remediation/compensation;Patient/family education;Balance training    OT Goals(Current goals can be found in the care plan section) Acute Rehab OT Goals Patient Stated Goal: to get stronger and go home  OT Goal Formulation: With patient Time For Goal Achievement: 03/16/16 Potential to Achieve Goals: Good ADL Goals Pt Will Perform Grooming: with supervision;standing Pt Will Perform Upper Body Bathing: with modified independence;sitting Pt Will Perform Lower Body Bathing: with supervision;sit to/from stand Pt Will Perform Upper Body Dressing: with modified independence;sitting Pt Will Perform Lower Body  Dressing: with supervision;sit to/from stand Pt Will Transfer to Toilet: with supervision;ambulating;regular height toilet;bedside commode;grab bars Pt Will Perform Toileting - Clothing Manipulation and hygiene: with supervision;sit to/from stand  OT Frequency: Min 2X/week   Barriers to D/C:            Co-evaluation              End of Session Nurse Communication: Mobility status  Activity Tolerance: Patient tolerated treatment well Patient left: in chair;with chair alarm set;with family/visitor present;with nursing/sitter in room   Time: [id number] OT Time Calculation (min): 22 min Charges:    G-Codes:    Lucille Passy M 2016/03/07, 12:59 PM

## 2016-03-02 NOTE — Progress Notes (Signed)
Pt refused all his meds at 2300. MD paged at 2318. Ativan 1mg  IV given at 2348. Pt still refusing meds, including insulin, last taken CBG 254. CIWA score 33, MD notified at [place].

## 2016-03-02 NOTE — Progress Notes (Signed)
Subjective: Currently patient has had no further seizures. He is alert to [name] and the month of September but could not tell me the year. Daughter at bedside states he remains confused and hallucinating, seeing bugs.  Exam: Vitals:   03/02/16 0748 03/02/16 0816  BP:  (!) 161/112  Pulse: 60 60  Resp: 16 19  Temp:  97.2 F (36.2 C)    HEENT-  Normocephalic, no lesions, without obvious abnormality.  Normal external eye and conjunctiva.  Normal TM's bilaterally.  Normal auditory canals and external ears. Normal external nose, mucus membranes and septum.  Normal pharynx. Cardiovascular- S1, S2 normal, pulses palpable throughout   Lungs- chest clear, no wheezing, rales, normal symmetric air entry Abdomen- normal findings: bowel sounds normal Extremities- no edema Lymph-no adenopathy palpable Musculoskeletal-no joint tenderness, deformity or swelling Skin-warm and dry, no hyperpigmentation, vitiligo, or suspicious lesions    Gen: In bed, NAD MS: Patient is alert, oriented to [name], oriented to September, he is not lethargic today. CN: Pupils equal round and reactive to light, extraocular movements intact, face is symmetric, sensation is grossly intact, smile is equal Motor: Moving all extremities well Sensory: Sensation grossly intact   Pertinent Labs/Diagnostics: EEG shows no epileptiform activity  [name] PA-C [facility] [phone]  82 yo M with new onset seizures found have Lesions in the brain that may or may not represent metastatic tumors. Given this uncertainty, I do that I would favor stopping his steroids. He'll need relatively short timeframe follow-up. Given that he had some hemorrhage likely associated with bumping his head, I have asked for his Plavix to be helpful for one week.  Recommendations: 1) agree with starting steroids 2) Keppra 500 mg twice a day 3) hold Plavix for one week 4) he will need close outpatient follow-up with  neurology for repeat imaging  [name], MD [facility] [phone]  If 7pm- 7am, please page neurology on call as listed in [place].  03/02/2016, 9:29 AM

## 2016-03-02 NOTE — Progress Notes (Signed)
IP PROGRESS NOTE  Subjective:   Events in the last few days noted. [name] feels comfortable at this time without any complaints. He denied any headaches or blurry vision. Although he is periodically confused. He does not report any abdominal pain or difficulty with urination.  Objective:  Vital signs in last 24 hours: Temp:  [97.2 F (36.2 C)-98.3 F (36.8 C)] 97.2 F (36.2 C) (09/11 0816) Pulse Rate:  [57-79] 60 (09/11 0816) Resp:  [14-19] 19 (09/11 0816) BP: (147-170)/(72-112) 161/112 (09/11 0816) SpO2:  [97 %-100 %] 97 % (09/11 0816) Weight change:  Last BM Date:  (PTA)  Intake/Output from previous day: 09/10 0701 - 09/11 0700 In: 635 [P.O.:480; IV Piggyback:155] Out: 1250 [Urine:1250] Alert, awake gentleman appeared without distress. Mouth: mucous membranes moist, pharynx normal without lesions Resp: clear to auscultation bilaterally Cardio: regular rate and rhythm, S1, S2 normal, no murmur, click, rub or gallop GI: soft, non-tender; bowel sounds normal; no masses,  no organomegaly Extremities: extremities normal, atraumatic, no cyanosis or edema Skin: Ecchymosis noted on his upper extremities. Neurological: No deficits noted.   Lab Results:  Recent Labs  02/29/16 0655 03/01/16 0213  WBC 8.2 8.5  HGB 11.7* 12.0*  HCT 35.4* 35.2*  PLT 107* 106*    BMET  Recent Labs  02/29/16 0655 03/01/16 0213  NA 136 133*  K 3.3* 4.5  CL 99* 104  CO2 25 24  GLUCOSE 114* 215*  BUN 9 12  CREATININE 0.97 0.96  CALCIUM 8.1* 8.2*    Studies/Results: Ct Head Wo Contrast  Result Date: 02/29/2016 CLINICAL DATA:  Altered mental status. EXAM: CT HEAD WITHOUT CONTRAST TECHNIQUE: Contiguous axial images were obtained from the base of the skull through the vertex without intravenous contrast. COMPARISON:  Yesterday FINDINGS: Brain: Study again degraded by motion, requiring repeat acquisitions. There are 2 areas of low-density that are stable from previous, parasagittal  right frontal and left occipital at the atrium of the left lateral ventricle. There is a 1 cm rounded high-density area in the left occipital region which is stable compared to yesterday's postcontrast study. High-density was also subtly seen on precontrast scan from the same day. Superficial, likely subarachnoid high-density in the anterior interhemispheric fissure. Small bilateral cerebral convexity subdural hygromas without significant mass effect. 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 Chronic small-vessel ischemic change in the cerebral white matter. Vascular: Atherosclerotic calcifications. Skull: Remote left posterior frontal craniotomy. No acute or aggressive process. Sinuses/Orbits: Chronic right mastoid effusion, present since at least 2013. Negative nasopharynx. IMPRESSION: 1. Stable, limited exam. 2. 1 cm left occipital high density area is unchanged from prior, potentially hemorrhage or mineralization rather than enhancement suspected yesterday. Recommend continued close follow-up. 3. Stable small hemorrhage in the anterior interhemispheric fissure without mass effect 4. Parasagittal right frontal and left occipital parenchymal edema which could be cytotoxic or vasogenic. Electronically Signed   By: Monte Fantasia M.D.   On: 02/29/2016 17:32   Ct Soft Tissue Neck W Contrast  Result Date: 03/01/2016 CLINICAL DATA:  History of bladder cancer. Altered mental status. Suspected brain metastasis of unknown origin. EXAM: CT NECK WITH CONTRAST TECHNIQUE: Multidetector CT imaging of the neck was performed using the standard protocol following the bolus administration of intravenous contrast. CONTRAST:  170mL ISOVUE-300 IOPAMIDOL (ISOVUE-300) INJECTION (administered for both neck and body imaging. COMPARISON:  None. FINDINGS: Pharynx and larynx: No suspicious asymmetry or enhancement. Salivary glands: Parotidectomy on the right. Flat scar-like opacity in the  resection bed without concerning nodule. Abnormal right submandibular gland, favor fatty atrophy over excision. Thyroid: Negative Lymph nodes: None enlarged or abnormal density. Vascular: Atheromatous changes with patent major vessels. Porta catheter into the right internal jugular vein. Limited intracranial: Known 10 to 11 mm high-density masslike area in the left occipital lobe is unchanged compared to previous. Visualized orbits: Negative where seen Mastoids and visualized paranasal sinuses: Chronic right mastoiditis and middle ear opacification. Question if this is related to previous radiotherapy. Skeleton: Degenerative changes.  No acute or aggressive process. Upper chest: Reported separately IMPRESSION: 1. No evidence of malignancy in the neck. 2. Right parotidectomy. 3. Chronic right otomastoiditis. Electronically Signed   By: Monte Fantasia M.D.   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 Additional history of urothelial carcinoma treated with chemotherapy, most recently in June 2017. EXAM: CT CHEST, ABDOMEN, AND PELVIS WITH CONTRAST TECHNIQUE: Multidetector CT imaging of the chest, abdomen and pelvis was performed following the standard protocol during bolus administration of intravenous contrast. CONTRAST:  144mL ISOVUE-300 IOPAMIDOL (ISOVUE-300) INJECTION 61% COMPARISON:  12/1915 CT chest, abdomen and pelvis. FINDINGS: Motion degraded scan. CT CHEST FINDINGS Cardiovascular: Stable top-normal heart size. No significant pericardial fluid/thickening. Right internal jugular MediPort terminates at the cavoatrial junction. Stable configuration of 3 lead left subclavian pacemaker with lead tips in the right atrium and right ventricle. Left anterior descending and left  circumflex coronary atherosclerosis. Atherosclerotic nonaneurysmal thoracic aorta. Stable dilated main pulmonary artery (3.7 cm diameter). No central pulmonary emboli. Mediastinum/Nodes: No discrete thyroid nodules. Unremarkable esophagus. No pathologically enlarged axillary, mediastinal or hilar lymph nodes. Lungs/Pleura: No pneumothorax. Trace layering bilateral pleural effusions. There are stable scattered calcified subcentimeter granulomas in both lungs. There is mild compressive atelectasis in the dependent bilateral lower lobes. No acute consolidative airspace disease, lung masses or new significant pulmonary nodules in the aerated portions of the lungs. Musculoskeletal: No aggressive appearing focal osseous lesions. Moderate thoracic spondylosis. Healed deformities in the left posterior ribs. CT ABDOMEN PELVIS FINDINGS Hepatobiliary: Normal liver with no liver mass. Gallbladder is filled with calcified gas-filled gallstones, with no gallbladder wall thickening or pericholecystic fluid. No biliary ductal dilatation. Pancreas: Normal, with no mass or duct dilation. Spleen: Normal size. No mass. Adrenals/Urinary Tract: Normal adrenals. There is new mild right hydroureteronephrosis to the level of the proximal right ureter. No stone or gross obstructing mass is seen in the right urinary tract. No left hydronephrosis. Simple 1.2 cm posterior interpolar left renal cyst. No additional renal lesions. Collapsed bladder with stable chronic diffuse bladder wall thickening. Stomach/Bowel: Grossly normal stomach. Normal caliber small bowel with no small bowel wall thickening. Normal appendix. Diffuse colonic diverticulosis, with no large bowel wall thickening or pericolonic fat stranding. Vascular/Lymphatic: Atherosclerotic nonaneurysmal abdominal aorta. Patent portal, splenic, hepatic and renal veins. New mildly enlarged 1.0 cm aortocaval node (series 3/ image 63). No additional pathologically enlarged abdominopelvic  nodes. Reproductive: Normal size prostate. Other: No pneumoperitoneum, ascites or focal fluid collection. Moderate fat containing right inguinal hernia, stable. Musculoskeletal: No aggressive appearing focal osseous lesions. Moderate lumbar spondylosis. IMPRESSION: 1. Limited motion degraded scan. 2. New mild right hydroureteronephrosis to the level of the proximal right ureter. No stones or obvious obstructing mass is seen in this location. A urothelial neoplasm cannot be excluded in the right  proximal ureter. Urology consultation advised. Consider correlation with retrograde pyelogram and/or short-term follow-up outpatient hematuria protocol CT abdomen/pelvis without and with IV contrast when the patient is able to remain still. 3. New mildly enlarged aortocaval retroperitoneal node, nonspecific, nodal metastasis not excluded. 4. No evidence of metastatic disease in the chest. 5. Additional findings include trace layering bilateral pleural effusions, aortic atherosclerosis, coronary atherosclerosis, stable dilated main pulmonary artery suggesting chronic pulmonary hypertension, cholelithiasis, diffuse colonic diverticulosis, stable chronic mild diffuse bladder wall thickening and moderate fat containing right inguinal hernia. Electronically Signed   By: Ilona Sorrel M.D.   On: 03/01/2016 18:31   Ct Abdomen Pelvis W Contrast  Result Date: 03/01/2016 CLINICAL DATA:  82 year old male inpatient with reported history of new brain metastasis of uncertain origin. 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 Additional history of urothelial carcinoma treated with chemotherapy, most recently in June 2017. EXAM: CT CHEST, ABDOMEN, AND PELVIS WITH CONTRAST TECHNIQUE: Multidetector CT imaging of the chest, abdomen and pelvis was performed following the standard protocol during bolus administration of intravenous contrast. CONTRAST:  110mL ISOVUE-300 IOPAMIDOL  (ISOVUE-300) INJECTION 61% COMPARISON:  12/1915 CT chest, abdomen and pelvis. FINDINGS: Motion degraded scan. CT CHEST FINDINGS Cardiovascular: Stable top-normal heart size. No significant pericardial fluid/thickening. Right internal jugular MediPort terminates at the cavoatrial junction. Stable configuration of 3 lead left subclavian pacemaker with lead tips in the right atrium and right ventricle. Left anterior descending and left circumflex coronary atherosclerosis. Atherosclerotic nonaneurysmal thoracic aorta. Stable dilated main pulmonary artery (3.7 cm diameter). No central pulmonary emboli. Mediastinum/Nodes: No discrete thyroid nodules. Unremarkable esophagus. No pathologically enlarged axillary, mediastinal or hilar lymph nodes. Lungs/Pleura: No pneumothorax. Trace layering bilateral pleural effusions. There are stable scattered calcified subcentimeter granulomas in both lungs. There is mild compressive atelectasis in the dependent bilateral lower lobes. No acute consolidative airspace disease, lung masses or new significant pulmonary nodules in the aerated portions of the lungs. Musculoskeletal: No aggressive appearing focal osseous lesions. Moderate thoracic spondylosis. Healed deformities in the left posterior ribs. CT ABDOMEN PELVIS FINDINGS Hepatobiliary: Normal liver with no liver mass. Gallbladder is filled with calcified gas-filled gallstones, with no gallbladder wall thickening or pericholecystic fluid. No biliary ductal dilatation. Pancreas: Normal, with no mass or duct dilation. Spleen: Normal size. No mass. Adrenals/Urinary Tract: Normal adrenals. There is new mild right hydroureteronephrosis to the level of the proximal right ureter. No stone or gross obstructing mass is seen in the right urinary tract. No left hydronephrosis. Simple 1.2 cm posterior interpolar left renal cyst. No additional renal lesions. Collapsed bladder with stable chronic diffuse bladder wall thickening. Stomach/Bowel:  Grossly normal stomach. Normal caliber small bowel with no small bowel wall thickening. Normal appendix. Diffuse colonic diverticulosis, with no large bowel wall thickening or pericolonic fat stranding. Vascular/Lymphatic: Atherosclerotic nonaneurysmal abdominal aorta. Patent portal, splenic, hepatic and renal veins. New mildly enlarged 1.0 cm aortocaval node (series 3/ image 63). No additional pathologically enlarged abdominopelvic nodes. Reproductive: Normal size prostate. Other: No pneumoperitoneum, ascites or focal fluid collection. Moderate fat containing right inguinal hernia, stable. Musculoskeletal: No aggressive appearing focal osseous lesions. Moderate lumbar spondylosis. IMPRESSION: 1. Limited motion degraded scan. 2. New mild right hydroureteronephrosis to the level of the proximal right ureter. No stones or obvious obstructing mass is seen in this location. A urothelial neoplasm cannot be excluded in the right proximal ureter. Urology consultation advised. Consider correlation with retrograde pyelogram and/or short-term follow-up outpatient hematuria protocol CT abdomen/pelvis without  and with IV contrast when the patient is able to remain still. 3. New mildly enlarged aortocaval retroperitoneal node, nonspecific, nodal metastasis not excluded. 4. No evidence of metastatic disease in the chest. 5. Additional findings include trace layering bilateral pleural effusions, aortic atherosclerosis, coronary atherosclerosis, stable dilated main pulmonary artery suggesting chronic pulmonary hypertension, cholelithiasis, diffuse colonic diverticulosis, stable chronic mild diffuse bladder wall thickening and moderate fat containing right inguinal hernia. Electronically Signed   By: Ilona Sorrel M.D.   On: 03/01/2016 18:31    Medications: I have reviewed the patient's current medications.  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 CT scan in January 2017 showed pelvic adenopathy indicating metastatic disease.  He is status post salvage chemotherapy utilizing carboplatin and gemcitabine with his last CT scan in July 2017 showed no gross evidence of disease. CT scan obtained during this hospitalization was also reviewed and showed no clear-cut evidence of systemic metastasis.  2. Seizure: Patient hospitalized on 02/28/2016 and CT scan of the head with contrast showed 2 masses in the left occipital lobe and the right frontal lobe with vasogenic edema suspicious for metastatic disease.  Repeat CT scan on 02/29/2016 showed 1 cm left occipital high-density area was unchanged from prior. Potentially hemorrhage or mineralization rather than enhancement suspected on previous CT scan.  These findings are equivocal for brain metastasis but certainly a possibility. He does not have any evidence of any systemic disease and transitional cell carcinoma of the genitourinary tract can present with CNS metastasis.  I agree with dexamethasone treatment for vasogenic edema and the case will be discussed with Dr. Tammi Klippel from radiation oncology as well as be presented in the brain tumor Board. If this is felt to be indeed brain metastasis, radiation would be the treatment of choice. There is no role for systemic therapy at this particular setting.  These findings was discussed with the patient and his daughter who is upset about these findings and wondering why CT scan was not done back in July 2017. I explained to her that there is no indication to obtain imaging studies of the brain at that time without any symptoms especially for this particular tumor that is less likely to spread to the brain without any symptoms.  3. Disposition: I agree with the current plan with physical therapy and discharge in the near future. Outpatient follow-up will be arranged for the  patient next few weeks.    LOS: 2 days   [facility] 03/02/2016, 10:58 AM

## 2016-03-02 NOTE — Progress Notes (Signed)
Oncology addendum:  The case was discussed in the brain tumor case conference and the group felt that the findings on the CT scan remain equivocal and could be related to other etiologies rather than brain metastasis. Definitive therapy is not indicated at this time and monitoring with repeat imaging studies of the brain in the near future may be warranted.

## 2016-03-02 NOTE — Progress Notes (Addendum)
PROGRESS NOTE  [name]  [id number] DOB: 1929/09/05  DOA: 02/28/2016 PCP: [name], MD  Primary Cardiologist: Dr. Sanda Klein Primary Oncologist: Dr. Zola Button Primary Urologist: Dr. Kathie Rhodes Primary Neurosurgeon: Dr. Newman Pies.   Brief Narrative:  82 y.o. male, single, his daughter [name] lives with him, ambulates with the help of a cane and walker, PMH of atrial fibrillation not on anticoagulation due to previous subdural hematoma, pacemaker implanted 1990 for sinus node dysfunction, mild-to-moderate degenerative aortic stenosis, subdural hematoma, diet controlled diabetes, transitional cell carcinoma with intermittent hematuria, s/p chemotherapy, TIA, HLD, HTN, presented to [facility] ED on 02/28/16 following an episode of passing out at home and was found on the floor by his daughter. History of an episode of being found on the floor 4 days prior to current event and an episode of transient speech difficulties 2 days prior to admission. Had witnessed seizure in ED. CT head with contrast showed 2 masses in left occipital lobe and right frontal lobe with vasogenic edema strongly suspicious for metastatic disease. Neurology and medical oncology consulted. Discussed with neurosurgery. Admitted to stepdown for further management. Clinically improved. Transfer to medical floor 9/11. Possible DC to SNF 9/12.  Assessment & Plan:   Principal Problem:   Seizures ([place]) Active Problems:   Pacemaker   Diabetes mellitus ([place])   Hypertension   Dyslipidemia   ETOH abuse   Permanent atrial fibrillation ([name]   Cancer of renal pelvis ([name]   Coronary artery disease   Thrombocytopenia ([name]   Hematuria   Syncope   Hypokalemia   Seizures (new onset), and fall at home - What was felt to syncope was likely new onset seizures related to newly diagnosed brain metastasis. - CT head with contrast 02/28/16:9 mm enhancing mass left occipital lobe with vasogenic edema. Stable  vasogenic edema right frontal lobe-secondary underlying mass is suspected. Highly suspicious for intracranial metastasis. - Repeat head CT 02/29/16:1 cm left occipital high-density 8 year is unchanged from prior. Parasagittal right frontal and left occipital parenchymal edema which could be cytotoxic or vasogenic. As per neurology follow-up, he has a mild subarachnoid or subdural hematoma along the falx. - Neurology consultation appreciated. Loaded with IV Keppra and continue 500 mg every 12 hours. - EEG 02/29/16: Normal without seizures or seizure predisposition. - 2-D echo: LVEF 60-65%. Moderate AS - Patient's pacemaker prevents [place] from getting MRI of brain (checked with [employer] on 9/9) - Started Decadron 10 mg IV 1 followed by 4 mg IV every 6 hours. As discussed with oncology 9/11, taper Decadron to 4 MG 3 times daily today and consider 4 MG 2 times daily at discharge until follow-up with oncology as outpatient. - Discussed with neurosurgeon on call 9/9: The brain lesions are nonsurgical but may be amenable to stereotactic radiosurgery and recommend oncological evaluation - Oncology consultation 9/9 appreciated: Highly unusual to see transitional cell carcinoma/urothelial cell carcinoma with brain metastasis although not impossible. 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   Delirium - Possibly multifactorial: Post ictal from seizures in the ED, brain metases, medications (Ativan). Less likely to be from alcohol withdrawal yet. Possible underlying mild dementia.  - Treat underlying cause and monitor. - Improving slowly. As per daughter at bedside, mental status is approximately 60% better compared to admission. As per RN, has significant sundowning and required Ativan last night.  Dehydration with hyponatremia - Started diet per speech therapy recommendations - Dehydration resolved. Hyponatremia stable. DC  IV fluids.   Hypokalemia -  Replaced  Hypomagnesemia - replaced  Thrombocytopenia and anemia - Seems chronic. Stable  Elevated troponin - Unclear etiology.? Demand ischemia. No reported chest pain. - Cycle troponin> mild elevation and mostly flat trend. - ? Related to brain event/seizures  Alcohol dependence - Patient apparently drinks 4 mixed drinks daily. Has had prior history of alcohol withdrawal. - CIWA protocol  Permanent atrial fibrillation/pacemaker - Controlled ventricular rate. Not anticoagulation candidate secondary to previous subdural hematoma. Patient on Plavix at home-as per neurology recommendations, hold Plavix for 1 week.   Essential hypertension -Controlled at home off of meds. Monitor without medications for now. Fluctuating. Start low-dose amlodipine.   Diet controlled DM - Monitor CBGs and started SSI. .   check A1c again.  TIA history - Plavix held due to subarachnoid/subdural hematoma.  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 Intermittent hematuria of concern.  - As per oncology follow-up 01/10/16: Completed 6 cycles of chemotherapy in June 2017. CT scan 01/08/16 reviewed with patient. Excellent response to therapy without any clear-cut residual tumor. Plans for observation and surveillance at that time and you salvage therapy upon progression. Plans to follow up in 3 months from then.  - Now brain lesions suspicious for metastatic disease. s per oncology consultation, suspect metastasis from head and neck cancer and not transitional cell cancer and workup as per their recommendations. -CT soft tissue neck, chest, abdomen and pelvis: Mild right hydroureteronephrosis to the level of the proximal right ureter. The urothelial neoplasm cannot be excluded in the right proximal ureter. Aorta caval retroperitoneal node. No evidence of malignancy in the neck. Discussed with Dr. Alen Blew- will follow up  hydroureteronephrosis as OP.  Dysphagia - Started on dysphagia 1 diet and thin liquids per speech therapy.    DVT prophylaxis: SCDs Code Status: Full Family Communication: Discussed extensively with patient's daughter Mrs. Sherrie at bedside.  Disposition Plan: Admitted to SDU. Transfer to Med floor on 9/11 and possible DC to SNF 9/12.   Consultants:   Neurology  Medical Oncology  Procedures:   2-D echo: Study Conclusions  - Procedure narrative: Transthoracic echocardiography. Image   quality was poor. The study was technically difficult, as a   result of poor sound wave transmission. - Left ventricle: The cavity size was normal. Systolic function was   normal. The estimated ejection fraction was in the range of 60%   to 65%. The study is not technically sufficient to allow   evaluation of LV diastolic function. Doppler parameters are   consistent with high ventricular filling pressure. Moderate   concentric and severe focal basal septal hypertrophy. - Aortic valve: Moderately calcified annulus. Moderately thickened,   moderately calcified leaflets. There was moderate stenosis. There   was mild regurgitation. Peak velocity (S): 299 cm/s. Mean   gradient (S): 18 mm Hg. Valve area (VTI): 1.4 cm^2. Valve area   (Vmax): 1.23 cm^2. Valve area (Vmean): 1.45 cm^2. - Mitral valve: Calcified annulus. Mildly calcified leaflets .   There was mild regurgitation. Valve area by pressure half-time:   2.2 cm^2. - Left atrium: The atrium was moderately dilated. - Right ventricle: The cavity size was mildly dilated. Pacer wire   or catheter noted in right ventricle. Systolic function was   moderately reduced. - Right atrium: The atrium was moderately dilated. - Tricuspid valve: There was moderate regurgitation. - Pulmonary arteries: PA peak pressure: 85 mm Hg (S). -  Inferior vena cava: The vessel was dilated. The respirophasic   diameter changes were blunted (< 50%), consistent with  elevated   central venous pressure.  Antimicrobials:   None    Subjective: Denies complaints. As per daughter at bedside, mental status has improved by about 60% compared to admission. As per RN, had sundowning and agitation overnight and required a dose of Ativan. No seizure-like activity reported.  Objective:  Vitals:   03/02/16 0325 03/02/16 0400 03/02/16 0748 03/02/16 0816  BP:  (!) 155/75  (!) 161/112  Pulse: 62 61 60 60  Resp: 14 15 16 19   Temp: 97.5 F (36.4 C)   97.2 F (36.2 C)  TempSrc: Oral   Axillary  SpO2: 98% 97% 98% 97%  Weight:      Height:        Intake/Output Summary (Last 24 hours) at 03/02/16 1054 Last data filed at 03/02/16 0941  Gross per 24 hour  Intake              860 ml  Output             1250 ml  Net             -390 ml   Filed Weights   02/28/16 1422 02/29/16 0500 03/01/16 0500  Weight: 86.2 kg (190 lb) 90 kg (198 lb 6.4 oz) 86.7 kg (191 lb 3.2 oz)    Examination:  General exam: Moderately built and nourished, chronically ill-looking, sitting up comfortably in the chair reading a newspaper this morning.  Respiratory system: Clear to auscultation. Respiratory effort normal. Cardiovascular system: S1 & S2 heard, irregularly irregular. No JVD, murmurs, rubs, gallops or clicks. No pedal edema. Telemetry: A. Fib with CVR and intermittently V paced rhythm.  Gastrointestinal system: Abdomen is nondistended, soft and nontender. No organomegaly or masses felt. Normal bowel sounds heard. Condom catheter with straw-colored urine and no gross hematuria. Central nervous system: Alert and oriented 4. No focal neurological deficits.Extremely hard of hearing from right ear.  Extremities: Symmetric 5 x 5 power. Extensive bruising at different stages on both upper extremities, some on lower extremities with hyperpigmentation changes of legs. Skin: as above Psychiatry: Judgement and insight appear impaired. Mood & affect : confused but not agitated.      Data Reviewed: I have personally reviewed following labs and imaging studies  CBC:  Recent Labs Lab 02/28/16 1630 02/29/16 0655 03/01/16 0213  WBC 9.0 8.2 8.5  NEUTROABS 8.1*  --   --   HGB 13.2 11.7* 12.0*  HCT 37.9* 35.4* 35.2*  MCV 109.5* 110.6* 111.0*  PLT 109* 107* [id number]*   Basic Metabolic Panel:  Recent Labs Lab 02/28/16 1630 02/29/16 0044 02/29/16 0655 03/01/16 0213  NA 134*  --  136 133*  K 3.2*  --  3.3* 4.5  CL 97*  --  99* 104  CO2 29  --  25 24  GLUCOSE 187*  --  114* 215*  BUN 11  --  9 12  CREATININE 1.03  --  0.97 0.96  CALCIUM 8.8*  --  8.1* 8.2*  MG  --  1.2*  --  1.9   GFR: Estimated Creatinine Clearance: 62.5 mL/min (by C-G formula based on SCr of 0.96 mg/dL). Liver Function Tests:  Recent Labs Lab 02/28/16 1630  AST 29  ALT 12*  ALKPHOS 113  BILITOT 1.8*  PROT 7.3  ALBUMIN 3.4*   No results for input(s): LIPASE, AMYLASE in the last 168 hours. No results  for input(s): AMMONIA in the last 168 hours. Coagulation Profile:  Recent Labs Lab 02/28/16 1630  INR 1.08   Cardiac Enzymes:  Recent Labs Lab 02/28/16 1630 02/29/16 0151 02/29/16 0655 02/29/16 1158  TROPONINI 0.09* 0.15* 0.13* 0.09*   BNP (last 3 results) No results for input(s): PROBNP in the last 8760 hours. HbA1C: No results for input(s): HGBA1C in the last 72 hours. CBG:  Recent Labs Lab 02/29/16 0809 02/29/16 2117 03/01/16 0813 03/01/16 1227 03/02/16 0800  GLUCAP 108* 254* 171* 211* 187*   Lipid Profile: No results for input(s): CHOL, HDL, LDLCALC, TRIG, CHOLHDL, LDLDIRECT in the last 72 hours. Thyroid Function Tests: No results for input(s): TSH, T4TOTAL, FREET4, T3FREE, THYROIDAB in the last 72 hours. Anemia Panel: No results for input(s): VITAMINB12, FOLATE, FERRITIN, TIBC, IRON, RETICCTPCT in the last 72 hours.  Sepsis Labs: No results for input(s): PROCALCITON, LATICACIDVEN in the last 168 hours.  Recent Results (from the past 240 hour(s))   MRSA PCR Screening     Status: Abnormal   Collection Time: 02/29/16 10:33 AM  Result Value Ref Range Status   MRSA by PCR POSITIVE (A) NEGATIVE Final    Comment:        The GeneXpert MRSA Assay (FDA approved for NASAL specimens only), is one component of a comprehensive MRSA colonization surveillance program. It is not intended to diagnose MRSA infection nor to guide or monitor treatment for MRSA infections. RESULT CALLED TO, READ BACK BY AND VERIFIED WITH: [facility] RN AT 1242 02/29/16 BY A.[name]          Radiology Studies: Ct Head Wo Contrast  Result Date: 02/29/2016 CLINICAL DATA:  Altered mental status. EXAM: CT HEAD WITHOUT CONTRAST TECHNIQUE: Contiguous axial images were obtained from the base of the skull through the vertex without intravenous contrast. COMPARISON:  Yesterday FINDINGS: Brain: Study again degraded by motion, requiring repeat acquisitions. There are 2 areas of low-density that are stable from previous, parasagittal right frontal and left occipital at the atrium of the left lateral ventricle. There is a 1 cm rounded high-density area in the left occipital region which is stable compared to yesterday's postcontrast study. High-density was also subtly seen on precontrast scan from the same day. Superficial, likely subarachnoid high-density in the anterior interhemispheric fissure. Small bilateral cerebral convexity subdural hygromas without significant mass effect. 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 Chronic small-vessel ischemic change in the cerebral white matter. Vascular: Atherosclerotic calcifications. Skull: Remote left posterior frontal craniotomy. No acute or aggressive process. Sinuses/Orbits: Chronic right mastoid effusion, present since at least 2013. Negative nasopharynx. IMPRESSION: 1. Stable, limited exam. 2. 1 cm left occipital high density area is unchanged from prior, potentially hemorrhage or mineralization  rather than enhancement suspected yesterday. Recommend continued close follow-up. 3. Stable small hemorrhage in the anterior interhemispheric fissure without mass effect 4. Parasagittal right frontal and left occipital parenchymal edema which could be cytotoxic or vasogenic. Electronically Signed   By: Monte Fantasia M.D.   On: 02/29/2016 17:32   Ct Soft Tissue Neck W Contrast  Result Date: 03/01/2016 CLINICAL DATA:  History of bladder cancer. Altered mental status. Suspected brain metastasis of unknown origin. EXAM: CT NECK WITH CONTRAST TECHNIQUE: Multidetector CT imaging of the neck was performed using the standard protocol following the bolus administration of intravenous contrast. CONTRAST:  157mL ISOVUE-300 IOPAMIDOL (ISOVUE-300) INJECTION (administered for both neck and body imaging. COMPARISON:  None. FINDINGS: Pharynx and larynx: No suspicious asymmetry or enhancement. Salivary  glands: Parotidectomy on the right. Flat scar-like opacity in the resection bed without concerning nodule. Abnormal right submandibular gland, favor fatty atrophy over excision. Thyroid: Negative Lymph nodes: None enlarged or abnormal density. Vascular: Atheromatous changes with patent major vessels. Porta catheter into the right internal jugular vein. Limited intracranial: Known 10 to 11 mm high-density masslike area in the left occipital lobe is unchanged compared to previous. Visualized orbits: Negative where seen Mastoids and visualized paranasal sinuses: Chronic right mastoiditis and middle ear opacification. Question if this is related to previous radiotherapy. Skeleton: Degenerative changes.  No acute or aggressive process. Upper chest: Reported separately IMPRESSION: 1. No evidence of malignancy in the neck. 2. Right parotidectomy. 3. Chronic right otomastoiditis. Electronically Signed   By: Monte Fantasia M.D.   On: 03/01/2016 18:18   Ct Chest W Contrast  Result Date: 03/01/2016 CLINICAL DATA:  82 year old male  inpatient with reported history of new brain metastasis of uncertain origin. 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 Additional history of urothelial carcinoma treated with chemotherapy, most recently in June 2017. EXAM: CT CHEST, ABDOMEN, AND PELVIS WITH CONTRAST TECHNIQUE: Multidetector CT imaging of the chest, abdomen and pelvis was performed following the standard protocol during bolus administration of intravenous contrast. CONTRAST:  121mL ISOVUE-300 IOPAMIDOL (ISOVUE-300) INJECTION 61% COMPARISON:  12/1915 CT chest, abdomen and pelvis. FINDINGS: Motion degraded scan. CT CHEST FINDINGS Cardiovascular: Stable top-normal heart size. No significant pericardial fluid/thickening. Right internal jugular MediPort terminates at the cavoatrial junction. Stable configuration of 3 lead left subclavian pacemaker with lead tips in the right atrium and right ventricle. Left anterior descending and left circumflex coronary atherosclerosis. Atherosclerotic nonaneurysmal thoracic aorta. Stable dilated main pulmonary artery (3.7 cm diameter). No central pulmonary emboli. Mediastinum/Nodes: No discrete thyroid nodules. Unremarkable esophagus. No pathologically enlarged axillary, mediastinal or hilar lymph nodes. Lungs/Pleura: No pneumothorax. Trace layering bilateral pleural effusions. There are stable scattered calcified subcentimeter granulomas in both lungs. There is mild compressive atelectasis in the dependent bilateral lower lobes. No acute consolidative airspace disease, lung masses or new significant pulmonary nodules in the aerated portions of the lungs. Musculoskeletal: No aggressive appearing focal osseous lesions. Moderate thoracic spondylosis. Healed deformities in the left posterior ribs. CT ABDOMEN PELVIS FINDINGS Hepatobiliary: Normal liver with no liver mass. Gallbladder is filled with calcified gas-filled gallstones, with no gallbladder wall  thickening or pericholecystic fluid. No biliary ductal dilatation. Pancreas: Normal, with no mass or duct dilation. Spleen: Normal size. No mass. Adrenals/Urinary Tract: Normal adrenals. There is new mild right hydroureteronephrosis to the level of the proximal right ureter. No stone or gross obstructing mass is seen in the right urinary tract. No left hydronephrosis. Simple 1.2 cm posterior interpolar left renal cyst. No additional renal lesions. Collapsed bladder with stable chronic diffuse bladder wall thickening. Stomach/Bowel: Grossly normal stomach. Normal caliber small bowel with no small bowel wall thickening. Normal appendix. Diffuse colonic diverticulosis, with no large bowel wall thickening or pericolonic fat stranding. Vascular/Lymphatic: Atherosclerotic nonaneurysmal abdominal aorta. Patent portal, splenic, hepatic and renal veins. New mildly enlarged 1.0 cm aortocaval node (series 3/ image 63). No additional pathologically enlarged abdominopelvic nodes. Reproductive: Normal size prostate. Other: No pneumoperitoneum, ascites or focal fluid collection. Moderate fat containing right inguinal hernia, stable. Musculoskeletal: No aggressive appearing focal osseous lesions. Moderate lumbar spondylosis. IMPRESSION: 1. Limited motion degraded scan. 2. New mild right hydroureteronephrosis to the level of the proximal right ureter. No stones or obvious obstructing mass is seen in this  location. A urothelial neoplasm cannot be excluded in the right proximal ureter. Urology consultation advised. Consider correlation with retrograde pyelogram and/or short-term follow-up outpatient hematuria protocol CT abdomen/pelvis without and with IV contrast when the patient is able to remain still. 3. New mildly enlarged aortocaval retroperitoneal node, nonspecific, nodal metastasis not excluded. 4. No evidence of metastatic disease in the chest. 5. Additional findings include trace layering bilateral pleural effusions, aortic  atherosclerosis, coronary atherosclerosis, stable dilated main pulmonary artery suggesting chronic pulmonary hypertension, cholelithiasis, diffuse colonic diverticulosis, stable chronic mild diffuse bladder wall thickening and moderate fat containing right inguinal hernia. Electronically Signed   By: Ilona Sorrel M.D.   On: 03/01/2016 18:31   Ct Abdomen Pelvis W Contrast  Result Date: 03/01/2016 CLINICAL DATA:  82 year old male inpatient with reported history of new brain metastasis of uncertain origin. 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 Additional history of urothelial carcinoma treated with chemotherapy, most recently in June 2017. EXAM: CT CHEST, ABDOMEN, AND PELVIS WITH CONTRAST TECHNIQUE: Multidetector CT imaging of the chest, abdomen and pelvis was performed following the standard protocol during bolus administration of intravenous contrast. CONTRAST:  136mL ISOVUE-300 IOPAMIDOL (ISOVUE-300) INJECTION 61% COMPARISON:  12/1915 CT chest, abdomen and pelvis. FINDINGS: Motion degraded scan. CT CHEST FINDINGS Cardiovascular: Stable top-normal heart size. No significant pericardial fluid/thickening. Right internal jugular MediPort terminates at the cavoatrial junction. Stable configuration of 3 lead left subclavian pacemaker with lead tips in the right atrium and right ventricle. Left anterior descending and left circumflex coronary atherosclerosis. Atherosclerotic nonaneurysmal thoracic aorta. Stable dilated main pulmonary artery (3.7 cm diameter). No central pulmonary emboli. Mediastinum/Nodes: No discrete thyroid nodules. Unremarkable esophagus. No pathologically enlarged axillary, mediastinal or hilar lymph nodes. Lungs/Pleura: No pneumothorax. Trace layering bilateral pleural effusions. There are stable scattered calcified subcentimeter granulomas in both lungs. There is mild compressive atelectasis in the dependent bilateral lower lobes.  No acute consolidative airspace disease, lung masses or new significant pulmonary nodules in the aerated portions of the lungs. Musculoskeletal: No aggressive appearing focal osseous lesions. Moderate thoracic spondylosis. Healed deformities in the left posterior ribs. CT ABDOMEN PELVIS FINDINGS Hepatobiliary: Normal liver with no liver mass. Gallbladder is filled with calcified gas-filled gallstones, with no gallbladder wall thickening or pericholecystic fluid. No biliary ductal dilatation. Pancreas: Normal, with no mass or duct dilation. Spleen: Normal size. No mass. Adrenals/Urinary Tract: Normal adrenals. There is new mild right hydroureteronephrosis to the level of the proximal right ureter. No stone or gross obstructing mass is seen in the right urinary tract. No left hydronephrosis. Simple 1.2 cm posterior interpolar left renal cyst. No additional renal lesions. Collapsed bladder with stable chronic diffuse bladder wall thickening. Stomach/Bowel: Grossly normal stomach. Normal caliber small bowel with no small bowel wall thickening. Normal appendix. Diffuse colonic diverticulosis, with no large bowel wall thickening or pericolonic fat stranding. Vascular/Lymphatic: Atherosclerotic nonaneurysmal abdominal aorta. Patent portal, splenic, hepatic and renal veins. New mildly enlarged 1.0 cm aortocaval node (series 3/ image 63). No additional pathologically enlarged abdominopelvic nodes. Reproductive: Normal size prostate. Other: No pneumoperitoneum, ascites or focal fluid collection. Moderate fat containing right inguinal hernia, stable. Musculoskeletal: No aggressive appearing focal osseous lesions. Moderate lumbar spondylosis. IMPRESSION: 1. Limited motion degraded scan. 2. New mild right hydroureteronephrosis to the level of the proximal right ureter. No stones or obvious obstructing mass is seen in this location. A urothelial neoplasm cannot be excluded in the right proximal ureter. Urology consultation  advised. Consider correlation with  retrograde pyelogram and/or short-term follow-up outpatient hematuria protocol CT abdomen/pelvis without and with IV contrast when the patient is able to remain still. 3. New mildly enlarged aortocaval retroperitoneal node, nonspecific, nodal metastasis not excluded. 4. No evidence of metastatic disease in the chest. 5. Additional findings include trace layering bilateral pleural effusions, aortic atherosclerosis, coronary atherosclerosis, stable dilated main pulmonary artery suggesting chronic pulmonary hypertension, cholelithiasis, diffuse colonic diverticulosis, stable chronic mild diffuse bladder wall thickening and moderate fat containing right inguinal hernia. Electronically Signed   By: Ilona Sorrel M.D.   On: 03/01/2016 18:31        Scheduled Meds: . Chlorhexidine Gluconate Cloth  6 each Topical Q0600  . dexamethasone  4 mg Oral [id number]  . folic acid  1 mg Oral Daily  . insulin aspart  0-5 Units Subcutaneous QHS  . insulin aspart  0-9 Units Subcutaneous TID WC  . levETIRAcetam  500 mg Intravenous Q12H  . mouth rinse  15 mL Mouth Rinse BID  . multivitamin with minerals  1 tablet Oral Daily  . mupirocin ointment  1 application Nasal BID  . pantoprazole  40 mg Oral Daily  . sodium chloride flush  10-40 mL Intracatheter Q12H  . sodium chloride flush  3 mL Intravenous Q12H  . thiamine  100 mg Oral Daily   Continuous Infusions:     LOS: 2 days    Time spent: 25 minutes.    [facility], MD [facility] Pager [phone]  If 7PM-7AM, please contact night-coverage [email or web address] Password [facility] 03/02/2016, 10:54 AM

## 2016-03-02 NOTE — Progress Notes (Signed)
Physical Therapy Treatment Patient Details Name: [name] MRN: [id number] DOB: May 24, 1930 Today's Date: 03/02/2016    History of Present Illness Patient is an 82 yo male admitted 02/28/16 after being found on floor by daughter.  Patient with similar syncopal episode several days prior.  Patient with new seizures, metastatic brain lesions, and delerium.    PMH:  Afib, SDH with craniotomy, pacemaker, DM, transitional cell CA, TIA, HTN, ETOH use, CAD    PT Comments    Patient apparently very agitated overnight with bil mits and new skin tear on Lt forearm (has been dressed by nursing). Able to follow instructions for OOB with 2 person assist (second person primarily for equipment and safety). HR irregular with multiple PVCs and registered up to 175 during transfer (quickly down to 130s, and then 110s by end of session).   Follow Up Recommendations  SNF;Supervision/Assistance - 24 hour     Equipment Recommendations  None recommended by PT    Recommendations for Other Services       Precautions / Restrictions Precautions Precautions: Fall Precaution Comments: syncope and seizures Restrictions Weight Bearing Restrictions: No    Mobility  Bed Mobility Overal bed mobility: Needs Assistance;+ 2 for safety/equipment Bed Mobility: Rolling;Sidelying to Sit Rolling: Min assist Sidelying to sit: Mod assist;+2 for safety/equipment;HOB elevated       General bed mobility comments: Verbal and tactile cues for technique.  Assist to raise trunk to sitting position.  Once upright, patient able to maintain sitting balance with min guard   Transfers Overall transfer level: Needs assistance Equipment used: 2 person hand held assist Transfers: Sit to/from [facility] Sit to Stand: Mod assist;+2 physical assistance Stand pivot transfers: Mod assist;+2 physical assistance       General transfer comment: stood with wide BOS and imbalance as bringing feet under himself;  very small pivotal steps; further standing/balance deferred due to incr HR/arrythmia  Ambulation/Gait                 Stairs            Wheelchair Mobility    Modified Rankin (Stroke Patients Only)       Balance Overall balance assessment: Needs assistance Sitting-balance support: No upper extremity supported;Feet supported Sitting balance-Leahy Scale: Fair     Standing balance support: Bilateral upper extremity supported Standing balance-Leahy Scale: Poor                      Cognition Arousal/Alertness: Awake/alert Behavior During Therapy: Restless Overall Cognitive Status: Impaired/Different from baseline Area of Impairment: Orientation;Attention;Memory;Safety/judgement;Awareness;Problem solving Orientation Level: Disoriented to;Situation;Time (knew month) Current Attention Level: Sustained Memory: Decreased short-term memory   Safety/Judgement: Decreased awareness of deficits;Decreased awareness of safety   Problem Solving: Slow processing;Difficulty sequencing;Requires verbal cues General Comments: Calm, able to remove bil mits and RN agreed can stay off. Daughter present. no hallucinations currently    Exercises      General Comments        Pertinent Vitals/Pain Pain Assessment: No/denies pain    Home Living                      Prior Function            PT Goals (current goals can now be found in the care plan section) Acute Rehab PT Goals Patient Stated Goal: Unable to state Time For Goal Achievement: 03/14/16 Progress towards PT goals: Progressing toward goals    Frequency  Min 3X/week    PT Plan Current plan remains appropriate    Co-evaluation             End of Session   Activity Tolerance: Treatment limited secondary to medical complications (Comment) (incr HR) Patient left: with call bell/phone within reach;with family/visitor present;in chair;with chair alarm set     Time: [id number] PT Time  Calculation (min) (ACUTE ONLY): 27 min  Charges:  $Therapeutic Activity: 23-37 mins                    G Codes:      Veronnica Hennings Mar 20, 2016, 10:12 AM  Pager [phone]

## 2016-03-02 NOTE — NC FL2 (Signed)
[place] LEVEL OF CARE SCREENING TOOL     IDENTIFICATION  Patient Name: [name] Birthdate: Nov 25, 1929 Sex: male Admission Date (Current Location): 02/28/2016  [facility] and [place] Number:  Herbalist and Address:  [facility] [place], [place] [address] [place], [place] [address]      Provider Number: [id number]  Attending Physician Name and Address:  Modena Jansky, MD  Relative Name and Phone Number:       Current Level of Care: Hospital Recommended Level of Care: [place] Prior Approval Number:    Date Approved/Denied:   PASRR Number: [id number]  Discharge Plan: SNF    Current Diagnoses: Patient Active Problem List   Diagnosis Date Noted  . Hypokalemia   . Seizures ([place]) 02/29/2016  . Syncope 02/28/2016  . Port catheter in place 02/21/2016  . Neutropenic fever ([place])   . Malnutrition of moderate degree 08/28/2015  . Diet-controlled diabetes mellitus ([place])   . Coronary artery disease 08/27/2015  . Thrombocytopenia ([place]) 08/27/2015  . Hematuria 08/27/2015  . Pancytopenia ([place]) 08/27/2015  . Sepsis ([place])   . 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 Chest pain 07/17/2014  . Aortic stenosis 04/29/2013  . Thrombocytosis ([place]) 03/30/2013  . Abnormal urine cytology 05/02/2012  . Acute pulmonary edema ([place]) 10/04/2011  . Alcohol withdrawal delirium ([place]) 10/04/2011  . TIA (transient ischemic attack) 10/03/2011  . Subdural hematoma ([place]) 10/02/2011  . ETOH abuse 10/02/2011  . Permanent atrial fibrillation ([place]) 10/02/2011  . UTI (lower urinary tract infection) 10/02/2011  . Elevated PSA 05/19/2011  . Pacemaker 05/19/2011  . Diabetes mellitus ([place]) 05/19/2011  . Hypertension 05/19/2011  . Dyslipidemia 05/19/2011    Orientation RESPIRATION BLADDER Height & Weight     Self  Normal Incontinent Weight: 86.7 kg (191 lb 3.2 oz) Height:  5\' 9"  (175.3 cm)  BEHAVIORAL SYMPTOMS/MOOD NEUROLOGICAL BOWEL  NUTRITION STATUS      Continent Diet  AMBULATORY STATUS COMMUNICATION OF NEEDS Skin   Extensive Assist Verbally Normal                       Personal Care Assistance Level of Assistance  Bathing, Dressing Bathing Assistance: Maximum assistance   Dressing Assistance: Maximum assistance     Functional Limitations Info             SPECIAL CARE FACTORS FREQUENCY  PT (By licensed PT), OT (By licensed OT)     PT Frequency: 5/wk OT Frequency: 5/wk            Contractures      Additional Factors Info  Code Status, Allergies, Insulin Sliding Scale, Isolation Precautions Code Status Info: FULL Allergies Info: Codeine, Docetaxel   Insulin Sliding Scale Info: 4/day Isolation Precautions Info: MRSA     Current Medications (03/02/2016):  This is the current hospital active medication list Current Facility-Administered Medications  Medication Dose Route Frequency Provider Last Rate Last Dose  . acetaminophen (TYLENOL) tablet 650 mg  650 mg Oral Q6H PRN Modena Jansky, MD      . amLODipine (NORVASC) tablet 2.5 mg  2.5 mg Oral Daily Modena Jansky, MD   2.5 mg at 03/02/16 1244  . Chlorhexidine Gluconate Cloth 2 % PADS 6 each  6 each Topical Q0600 Modena Jansky, MD   6 each at 03/02/16 0600  . dexamethasone (DECADRON) tablet 4 mg  4 mg Oral Q8H Modena Jansky, MD   4  mg at 03/02/16 1327  . fentaNYL (SUBLIMAZE) injection 12.5 mcg  12.5 mcg Intravenous Q8H PRN Modena Jansky, MD      . folic acid (FOLVITE) tablet 1 mg  1 mg Oral Daily Modena Jansky, MD   1 mg at 03/02/16 0943  . HYDROcodone-acetaminophen (NORCO/VICODIN) 5-325 MG per tablet 1 tablet  1 tablet Oral Q8H PRN Modena Jansky, MD      . insulin aspart (novoLOG) injection 0-5 Units  0-5 Units Subcutaneous QHS Modena Jansky, MD      . insulin aspart (novoLOG) injection 0-9 Units  0-9 Units Subcutaneous TID WC Modena Jansky, MD   5 Units at 03/02/16 1246  . levETIRAcetam (KEPPRA) 500 mg in sodium  chloride 0.9 % 100 mL IVPB  500 mg Intravenous Q12H Modena Jansky, MD   500 mg at 03/02/16 0941  . LORazepam (ATIVAN) tablet 1 mg  1 mg Oral Q6H PRN Modena Jansky, MD   1 mg at 02/29/16 1353   Or  . LORazepam (ATIVAN) injection 1 mg  1 mg Intravenous Q6H PRN Modena Jansky, MD   1 mg at 03/01/16 2239  . LORazepam (ATIVAN) injection 1 mg  1 mg Intravenous Q4H PRN Modena Jansky, MD      . MEDLINE mouth rinse  15 mL Mouth Rinse BID Modena Jansky, MD   15 mL at 03/02/16 0941  . multivitamin with minerals tablet 1 tablet  1 tablet Oral Daily Modena Jansky, MD   1 tablet at 03/02/16 0942  . mupirocin ointment (BACTROBAN) 2 % 1 application  1 application Nasal BID Modena Jansky, MD   1 application at [id number]  . pantoprazole (PROTONIX) EC tablet 40 mg  40 mg Oral Daily Modena Jansky, MD   40 mg at 03/02/16 0942  . polyethylene glycol (MIRALAX / GLYCOLAX) packet 17 g  17 g Oral Daily PRN Modena Jansky, MD      . sodium chloride flush (NS) 0.9 % injection 10-40 mL  10-40 mL Intracatheter Q12H Modena Jansky, MD   10 mL at 03/01/16 1023  . sodium chloride flush (NS) 0.9 % injection 10-40 mL  10-40 mL Intracatheter PRN Modena Jansky, MD      . sodium chloride flush (NS) 0.9 % injection 3 mL  3 mL Intravenous Q12H Modena Jansky, MD   3 mL at 03/02/16 0944  . thiamine (VITAMIN B-1) tablet 100 mg  100 mg Oral Daily Modena Jansky, MD   100 mg at 03/02/16 1042     Discharge Medications: Please see discharge summary for a list of discharge medications.  Relevant Imaging Results:  Relevant Lab Results:   Additional Information SS#: [id number], not currently receiving cancer treatment unclear if pt will be restarting in future  Elinora Weigand H, LCSW

## 2016-03-03 DIAGNOSIS — R410 Disorientation, unspecified: Secondary | ICD-10-CM

## 2016-03-03 DIAGNOSIS — R9089 Other abnormal findings on diagnostic imaging of central nervous system: Secondary | ICD-10-CM | POA: Insufficient documentation

## 2016-03-03 LAB — GLUCOSE, CAPILLARY
GLUCOSE-CAPILLARY: 230 mg/dL — AB (ref 65–99)
GLUCOSE-CAPILLARY: 185 mg/dL — AB (ref 65–99)
Glucose-Capillary: 152 mg/dL — ABNORMAL HIGH (ref 65–99)
Glucose-Capillary: 200 mg/dL — ABNORMAL HIGH (ref 65–99)

## 2016-03-03 LAB — HEMOGLOBIN A1C
Hgb A1c MFr Bld: 5.5 % (ref 4.8–5.6)
MEAN PLASMA GLUCOSE: 111 mg/dL

## 2016-03-03 MED ORDER — ACETAMINOPHEN 325 MG PO TABS: 650.0000 mg | ORAL_TABLET | Freq: Four times a day (QID) | ORAL | Status: DC | PRN

## 2016-03-03 MED ORDER — DIVALPROEX SODIUM ER 500 MG PO TB24: 500.0000 mg | ORAL_TABLET | Freq: Two times a day (BID) | ORAL | Status: DC

## 2016-03-03 MED ORDER — DIVALPROEX SODIUM 500 MG PO DR TAB
500.0000 mg | DELAYED_RELEASE_TABLET | Freq: Two times a day (BID) | ORAL | Status: DC
  Administered 2016-03-03 – 2016-03-04 (×3): 500 mg via ORAL
  Filled 2016-03-03 (×4): qty 1

## 2016-03-03 MED ORDER — DEXAMETHASONE 4 MG PO TABS
4.0000 mg | ORAL_TABLET | Freq: Two times a day (BID) | ORAL | Status: DC
Start: 2016-03-03 — End: 2016-03-04
  Administered 2016-03-04 (×2): 4 mg via ORAL
  Filled 2016-03-03 (×3): qty 1

## 2016-03-03 MED ORDER — AMLODIPINE BESYLATE 10 MG PO TABS
10.0000 mg | ORAL_TABLET | Freq: Every day | ORAL | Status: DC
  Administered 2016-03-04: 10 mg via ORAL
  Filled 2016-03-03: qty 1

## 2016-03-03 MED ORDER — LORAZEPAM 2 MG/ML IJ SOLN: 1.0000 mg | Freq: Once | INTRAMUSCULAR | Status: DC

## 2016-03-03 NOTE — Progress Notes (Signed)
Subjective: Had some confusion last night  Exam: Vitals:   03/02/16 2156 03/03/16 0658  BP: (!) 166/77 (!) 190/85  Pulse: 73 65  Resp: 18 18  Temp: 97.7 F (36.5 C) 97.7 F (36.5 C)   Gen: In bed, NAD Resp: non-labored breathing, no acute distress Abd: soft, nt  Neuro: MS: awake, alert, interactive and apporpriate [id number], EOMI Motor: MAEW Sensory:intact to LT  82 yo M with new onset seizures found have Lesions in the brain that may or may not represent metastatic tumors. Given the locations being right frontal and left occipital, I am leaning more and more towards this being coup-contrecoup injury. In this setting, he may not need long-term antiepileptic therapy but I would favor at least a few months of treatment.  He has had some delirium, especially at night, and I think that this is likely contributed to by steroids. Depakote also can sometimes be a more stabilizing medication and Keppra and it made be prudent to use this rather than Keppra as his antiepileptic  Recommendations: 1) would taper steroids 2) Depakote 500 mg twice a day 3) hold Plavix for one week from onset 4) he will need close outpatient follow-up with neurology for repeat imaging, I have requested this  [name], MD [facility] [phone]  If 7pm- 7am, please page neurology on call as listed in [place].  03/03/2016, 1:21 PM

## 2016-03-03 NOTE — Progress Notes (Signed)
MD ordered Ativan 1mg  IV, and fallow up with her 20 min later. Pt refused stating he will not take any meds tonight. MD notified.

## 2016-03-03 NOTE — Care Management Important Message (Signed)
Important Message  Patient Details  Name: [name] MRN: [id number] Date of Birth: 13-Jul-1929   Medicare Important Message Given:  Yes    Carles Collet, RN 03/03/2016, 1:57 PM

## 2016-03-03 NOTE — Care Management Note (Signed)
Case Management Note  Patient Details  Name: [name] MRN: [id number] Date of Birth: 1929-12-16  Subjective/Objective:                 Tx from [place] 03-02-16. Pt admitted with syncope vs seizure activity, AMS and fall at home.    Action/Plan:  Anticipate DC to SNF as facilitated by CSW.   Expected Discharge Date:                  Expected Discharge Plan:  Skilled Nursing Facility  In-House Referral:  Clinical Social Work  Discharge planning Services  CM Consult  Post Acute Care Choice:  NA Choice offered to:  NA  DME Arranged:  N/A DME Agency:  NA  HH Arranged:  NA HH Agency:  NA  Status of Service:  In process, will continue to follow  If discussed at Long Length of Stay Meetings, dates discussed:    Additional Comments:  Carles Collet, RN 03/03/2016, 1:49 PM

## 2016-03-03 NOTE — Progress Notes (Signed)
IP PROGRESS NOTE  Subjective:   Mr. Ertman feels reasonably well today without any complaints. He did have some confusion episodes overnight refuse to take medication. His delirium mother been attributed to steroids. He appears fairly appropriate this afternoon without any confusion or alteration of his mental status.  Objective:  Vital signs in last 24 hours: Temp:  [97.7 F (36.5 C)] 97.7 F (36.5 C) (09/12 0658) Pulse Rate:  [60-73] 65 (09/12 0658) Resp:  [18] 18 (09/12 0658) BP: (151-190)/(62-85) 190/85 (09/12 0658) SpO2:  [69 %-100 %] 69 % (09/12 0658) Weight change:  Last BM Date:  (PTA)  Intake/Output from previous day: 09/11 0701 - 09/12 0700 In: 705 [P.O.:600; IV Piggyback:105] Out: 2200 [Urine:2200] Alert, awake gentleman appeared without distress. Mouth: mucous membranes moist, pharynx normal without lesions no thrush noted. Resp: clear to auscultation bilaterally Cardio: regular rate and rhythm, S1, S2 normal, no murmur, click, rub or gallop GI: soft, non-tender; bowel sounds normal; no masses,  no organomegaly Extremities: extremities normal, atraumatic, no cyanosis or edema Skin: Ecchymosis noted on his upper extremities. Neurological: No deficits noted.   Lab Results:  Recent Labs  03/01/16 0213  WBC 8.5  HGB 12.0*  HCT 35.2*  PLT 106*    BMET  Recent Labs  03/01/16 0213  NA 133*  K 4.5  CL 104  CO2 24  GLUCOSE 215*  BUN 12  CREATININE 0.96  CALCIUM 8.2*     Medications: I have reviewed the patient's current medications.  Assessment/Plan:  82 year old gentleman with the following issues:  1. 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 CT scan in January 2017 showed pelvic adenopathy indicating metastatic disease.  He is status post salvage chemotherapy utilizing carboplatin and gemcitabine with his last CT scan in July 2017 showed no gross  evidence of disease. CT scan obtained during this hospitalization was also reviewed and showed no clear-cut evidence of systemic metastasis.  2. Seizure: Patient hospitalized on 02/28/2016 and CT scan of the head with contrast showed 2 masses in the left occipital lobe and the right frontal lobe with vasogenic edema suspicious for metastatic disease.  Repeat CT scan on 02/29/2016 showed 1 cm left occipital high-density area was unchanged from prior. Potentially hemorrhage or mineralization rather than enhancement suspected on previous CT scan.  These findings are equivocal for brain metastasis but certainly a possibility.   This case was discussed with Dr. Tammi Klippel and presented at the brain tumor conference. The recommendation is to follow-up closely and consider treatment if his follow-up imaging studies supports the diagnosis of brain metastasis.  He is following up with neurology as an outpatient with repeat imaging studies and I think it is reasonable to hold radiation therapy to the brain, we confirm definite brain metastasis.  I agree with quick taper of his dexamethasone in the near future.   3. Disposition: I agree with the current plan with physical therapy and discharge in the near future. Outpatient follow-up will be arranged for the patient next few weeks.    LOS: 3 days   Marietta Sikkema 03/03/2016, 1:48 PM

## 2016-03-03 NOTE — Progress Notes (Signed)
CSW following for eventual SNF placement- 1st choice is [facility] 2nd choice is [facility]  CSW will continue to follow  Jorge Ny, [place] Social Worker [phone]

## 2016-03-03 NOTE — Progress Notes (Signed)
Speech Language Pathology Treatment: Dysphagia  Patient Details Name: [name] MRN: [id number] DOB: 02/29/1930 Today's Date: 03/03/2016 Time: 0910-0923 SLP Time Calculation (min) (ACUTE ONLY): 13 min  Assessment / Plan / Recommendation Clinical Impression  Pt demonstrates adequate attention and strength to tolerate regular and thin textures without assist. Will upgrade diet. No SLP f/u needed, will sign off.    HPI        SLP Plan  Discharge SLP treatment due to (comment);All goals met     Recommendations  Diet recommendations: Regular;Thin liquid Liquids provided via: Cup;Straw Medication Administration: Whole meds with liquid Supervision: Patient able to self feed Postural Changes and/or Swallow Maneuvers: Seated upright 90 degrees             Plan: Discharge SLP treatment due to (comment);All goals met     GO                , MA CCC-SLP [phone]  ,  [place] 03/03/2016, 3:26 PM    

## 2016-03-03 NOTE — Consult Note (Signed)
Radiation Oncology         [phone] ________________________________  Initial inpatient Consultation  Name: [name] MRN: [id number]  Date: 03/03/16  DOB: 1929-12-30  [id number] [name], MD  No ref. provider found   REFERRING PHYSICIAN: No ref. provider found  DIAGNOSIS: The primary encounter diagnosis was Loss of consciousness. 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    ICD-9-CM ICD-10-CM   1. Loss of consciousness 780.09 R40.4   2. Seizure ([place]) 780.39 R56.9   3. Abnormal CT of brain 793.0 R93.0 CT HEAD WO CONTRAST     CT HEAD WO CONTRAST     CANCELED: MR Brain W Wo Contrast     CANCELED: MR Brain W Wo Contrast  4. Syncope, unspecified syncope type 780.2 R55   5. Hypokalemia 276.8 E87.6   6. Thrombocytopenia ([name] 287.5 D69.6   7. Elevated troponin 790.6 R79.89   8. Abnormal head CT 793.0 R93.0 CT HEAD W CONTRAST     CT HEAD W CONTRAST  9. 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 Seizures ([name] 780.39 R56.9     HISTORY OF PRESENT ILLNESS: [name] is a 82 y.o. male seen at the request of Dr. Alvy Bimler for an abnormality seen on CT imaging of the brain. The patient presented on 02/28/16 after a fall at home which resulted in confusion, garbled speech, and seizure activity witnessed by his daughter. He has a history of subarachnoid hemorrhage a few years ago and underwent left frontal craniotomy with Dr. Arnoldo Morale. He has not had neurologic complications since per his daughter. Upon evaluation in the ED on 02/28/16, a noncontrast brain CT revealed  A right frontal subcortical white matter density with mass effect. A probable right parafalcine subarachnoid hemorrhage was also noted. CT with contrast on 02/28/16  revealed an enhancing mass at the left occipital lobe with surrounding vasogenic edema and stable edema in the right frontal lobe with similar change along the parafalcine region on the right as seen earlier that day on the noncontrast study. A repeat noncontrast study on 02/29/16 revealed stability in both regions, the right frontal, and left occipital region. We are asked to see the patient out of the concern that these findings could represent metastatic lesions given a personal history of parotid cancer and urothelial cancer.   PREVIOUS RADIATION THERAPY: Yes   05/05/07-06/15/07: 66.4 Gy to the right parotid gland under Dr. Tammi Klippel.  PAST MEDICAL HISTORY:  Past Medical History:  Diagnosis Date  . Allergy   . Arthritis    degenerative-knees  . Atrial fibrillation ([place]) 1990  . Bladder cancer ([place]) 2010  . Cancer ([place]) 05/05/07-06/15/07   parotid gland/66.4 GY  . Cancer ([place])    left eyelid   . Chronic kidney disease    positive cytology  . Coronary artery disease   . Diabetes mellitus without complication ([place])    meds d/c  2-3 year ago  . Dry eyes   . Dyslipidemia   . Dysrhythmia    HX A FIB  . GERD (gastroesophageal reflux disease)   . Heart murmur    2/6 systolic  . History of chemotherapy     Hx carboplatin/57fu  . History of radiation therapy 05/05/07-06/15/07   right parotid through subclavicular region/ mid  jugularlymph node chain  . History of radiation therapy 05/05/07-06/15/07   Right parotid/hemineck,supraclavicular  . HOH (hard of hearing)   . Hypertension    labile  . Pacemaker    2003 vent paced , rate 69  . Pneumonia    hx of   . Prostate hypertrophy   . Right groin hernia   . Skin cancer    HX BASAL CELL REMOVED  . Stroke [facility]) 2003   TIA  . Tinnitus    right   . Urinary tract infection    hx of   . Xerostomia    limited      PAST SURGICAL HISTORY: Past Surgical History:  Procedure Laterality Date  . BACK SURGERY    . CARDIAC  CATHETERIZATION  09/20/2012   EF 50-55%, moderately calcified aortic valve leaflets, mild-moderate aortic valve stenosis, LA-appendage moderately dilated, moderate regurg of the mitral valve, RA moderately dilated  . CARDIOVASCULAR STRESS TEST  09/05/2009   Pharmalogical stress test w/o chest pain or EKG changes for ischemia  . CRANIOTOMY  12/14/2011   Procedure: CRANIOTOMY HEMATOMA EVACUATION SUBDURAL;  Surgeon: Ophelia Charter, MD;  Location: [facility];  Service: Neurosurgery;  Laterality: Left;  LEFT Craniotomy for subdural   . CYSTOSCOPY  01/2011   neg  . CYSTOSCOPY W/ RETROGRADES  04/01/2012   Procedure: CYSTOSCOPY WITH RETROGRADE PYELOGRAM;  Surgeon: Claybon Jabs, MD;  Location: [facility];  Service: Urology;  Laterality: Bilateral;  . CYSTOSCOPY WITH BIOPSY  04/01/2012   Procedure: CYSTOSCOPY WITH BIOPSY;  Surgeon: Claybon Jabs, MD;  Location: [facility];  Service: Urology;  Laterality: N/A;  BLADDER BIOPSIES and bladder washings  . CYSTOSCOPY WITH BIOPSY  05/02/2012   Procedure: CYSTOSCOPY WITH BIOPSY;  Surgeon: Claybon Jabs, MD;  Location: [facility];  Service: Urology;  Laterality: N/A;  . CYSTOSCOPY WITH STENT PLACEMENT  05/02/2012   Procedure: CYSTOSCOPY WITH STENT PLACEMENT;  Surgeon: Claybon Jabs, MD;  Location[facility];  Service: Urology;  Laterality: Bilateral;  . CYSTOSCOPY WITH URETEROSCOPY AND STENT PLACEMENT Right 02/15/2015   Procedure: CYSTOSCOPY WITH RIGHT URETEROSCOPY, RENAL PELVIC WASHINGS, STENT, RETROGRADE;  Surgeon: Kathie Rhodes, MD;  Location: [facility];  Service: Urology;  Laterality: Right;  . CYSTOSCOPY/RETROGRADE/URETEROSCOPY  05/02/2012   Procedure: CYSTOSCOPY/RETROGRADE/URETEROSCOPY;  Surgeon: Claybon Jabs, MD;  Location: [facility];  Service: Urology;  Laterality: Bilateral;  CYSTOSCOPY BILATERAL RETROGRADE PYLOGRAM BILATERAL URETEROSCOPY AND BIOPSY    .  CYSTOSCOPY/RETROGRADE/URETEROSCOPY Bilateral 01/21/2015   Procedure: CYSTOSCOPY/RETROGRADE/ BLADDER BIOPSY;  Surgeon: Kathie Rhodes, MD;  Location: [facility];  Service: Urology;  Laterality: Bilateral;  . LOWER EXTREMITY ARTERIAL DOPPLER  09/28/2011   No evidence of arterial insufficiency.   . [place]  . PACEMAKER GENERATOR CHANGE  05/23/2002   [facility] [place], [place], serial [phone]  . PACEMAKER GENERATOR CHANGE N/A 09/01/2013   Procedure: PACEMAKER GENERATOR CHANGE;  Surgeon: Sanda Klein, MD;  Location: [facility];  Service: Cardiovascular;  Laterality: N/A;  . [place]   last changed 2003  . PAROTIDECTOMY     right  . URETEROSCOPY  04/01/2012   Procedure: URETEROSCOPY;  Surgeon: Claybon Jabs, MD;  Location: [facility];  Service: Urology;  Laterality: Right;    FAMILY HISTORY:  Family History  Problem Relation Age of Onset  . Heart disease Mother   . Cancer Father     Lung cancer  .  Emphysema Brother   . Coronary artery disease Son     SOCIAL HISTORY:  Social History   Social History  . Marital status: Widowed    Spouse name: N/A  . Number of children: N/A  . Years of education: N/A   Occupational History  . Not on file.   Social History Main Topics  . Smoking status: Never Smoker  . Smokeless tobacco: Never Used  . Alcohol use 10.5 oz/week    21 Standard drinks or equivalent per week     Comment: 3 /day( bourbon)  . Drug use: No  . Sexual activity: No   Other Topics Concern  . Not on file   Social History Narrative  . No narrative on file    ALLERGIES: Codeine and Docetaxel  MEDICATIONS:  Current Facility-Administered Medications  Medication Dose Route Frequency Provider Last Rate Last Dose  . acetaminophen (TYLENOL) tablet 650 mg  650 mg Oral Q6H PRN Modena Jansky, MD      . amLODipine (NORVASC) tablet 2.5 mg  2.5 mg Oral Daily Modena Jansky, MD   2.5 mg at 03/02/16 1244  .  Chlorhexidine Gluconate Cloth 2 % PADS 6 each  6 each Topical Q0600 Modena Jansky, MD   6 each at 03/03/16 0600  . dexamethasone (DECADRON) tablet 4 mg  4 mg Oral Q8H Modena Jansky, MD   4 mg at 03/03/16 0648  . fentaNYL (SUBLIMAZE) injection 12.5 mcg  12.5 mcg Intravenous Q8H PRN Modena Jansky, MD      . folic acid (FOLVITE) tablet 1 mg  1 mg Oral Daily Modena Jansky, MD   1 mg at 03/02/16 0943  . HYDROcodone-acetaminophen (NORCO/VICODIN) 5-325 MG per tablet 1 tablet  1 tablet Oral Q8H PRN Modena Jansky, MD      . insulin aspart (novoLOG) injection 0-5 Units  0-5 Units Subcutaneous QHS Modena Jansky, MD      . insulin aspart (novoLOG) injection 0-9 Units  0-9 Units Subcutaneous TID WC Modena Jansky, MD   2 Units at 03/02/16 1858  . levETIRAcetam (KEPPRA) 500 mg in sodium chloride 0.9 % 100 mL IVPB  500 mg Intravenous Q12H Modena Jansky, MD   500 mg at 03/02/16 0941  . LORazepam (ATIVAN) injection 1 mg  1 mg Intravenous Q4H PRN Modena Jansky, MD      . MEDLINE mouth rinse  15 mL Mouth Rinse BID Modena Jansky, MD   15 mL at 03/02/16 0941  . multivitamin with minerals tablet 1 tablet  1 tablet Oral Daily Modena Jansky, MD   1 tablet at 03/02/16 0942  . mupirocin ointment (BACTROBAN) 2 % 1 application  1 application Nasal BID Modena Jansky, MD   1 application at [id number]  . pantoprazole (PROTONIX) EC tablet 40 mg  40 mg Oral Daily Modena Jansky, MD   40 mg at 03/02/16 0942  . polyethylene glycol (MIRALAX / GLYCOLAX) packet 17 g  17 g Oral Daily PRN Modena Jansky, MD      . sodium chloride flush (NS) 0.9 % injection 10-40 mL  10-40 mL Intracatheter Q12H Modena Jansky, MD   10 mL at 03/03/16 0500  . sodium chloride flush (NS) 0.9 % injection 10-40 mL  10-40 mL Intracatheter PRN Modena Jansky, MD      . sodium chloride flush (NS) 0.9 % injection 3 mL  3 mL Intravenous Q12H Modena Jansky, MD  3 mL at 03/03/16 0002  . thiamine (VITAMIN B-1) tablet 100  mg  100 mg Oral Daily Modena Jansky, MD   100 mg at 03/02/16 1042    REVIEW OF SYSTEMS:  On review of systems, the patient reports that he is doing well overall. He denies any episodes of headaches, blurred vision, auditory disturbances, nausea, or known events of confusion since his hospitalization. He denies any chest pain, shortness of breath, cough, fevers, chills, night sweats, unintended weight changes. He denies any bowel or bladder disturbances, and denies abdominal pain, nausea or vomiting. A complete review of systems is obtained and is otherwise negative.    PHYSICAL EXAM:  height is 5\' 9"  (1.753 m) and weight is 191 lb 3.2 oz (86.7 kg). His temperature is 97.7 F (36.5 C). His blood pressure is 190/85 (abnormal) and his pulse is 65. His respiration is 18 and oxygen saturation is 69% (abnormal).   Pain Scale 0/10 In general this is a well appearing caucasian male in no acute distress. He's alert and oriented x4 and appropriate throughout the examination. He has significant hemosiderin deposition of his upper and lower extremities and has bandages in place over his left forearm from injuries related to his recent fall. Cardiopulmonary assessment is negative for acute distress and he exhibits normal effort.     KPS =50  100 - Normal; no complaints; no evidence of disease. 90   - Able to carry on normal activity; minor signs or symptoms of disease. 80   - Normal activity with effort; some signs or symptoms of disease. 52   - Cares for self; unable to carry on normal activity or to do active work. 60   - Requires occasional assistance, but is able to care for most of his personal needs. 50   - Requires considerable assistance and frequent medical care. 37   - Disabled; requires special care and assistance. 71   - Severely disabled; hospital admission is indicated although death not imminent. 36   - Very sick; hospital admission necessary; active supportive treatment necessary. 10    - Moribund; fatal processes progressing rapidly. 0     - Dead  Karnofsky DA, Abelmann [place], Craver LS and Burchenal Ku [facility] [phone]) The use of the nitrogen mustards in the palliative treatment of carcinoma: with particular reference to bronchogenic carcinoma Cancer 1 634-56  LABORATORY DATA:  Lab Results  Component Value Date   WBC 8.5 03/01/2016   HGB 12.0 (L) 03/01/2016   HCT 35.2 (L) 03/01/2016   MCV 111.0 (H) 03/01/2016   PLT 106 (L) 03/01/2016   Lab Results  Component Value Date   NA 133 (L) 03/01/2016   K 4.5 03/01/2016   CL 104 03/01/2016   CO2 24 03/01/2016   Lab Results  Component Value Date   ALT 12 (L) 02/28/2016   AST 29 02/28/2016   ALKPHOS 113 02/28/2016   BILITOT 1.8 (H) 02/28/2016     RADIOGRAPHY: Ct Head Wo Contrast  Result Date: 02/29/2016 CLINICAL DATA:  Altered mental status. EXAM: CT HEAD WITHOUT CONTRAST TECHNIQUE: Contiguous axial images were obtained from the base of the skull through the vertex without intravenous contrast. COMPARISON:  Yesterday FINDINGS: Brain: Study again degraded by motion, requiring repeat acquisitions. There are 2 areas of low-density that are stable from previous, parasagittal right frontal and left occipital at the atrium of the left lateral ventricle. There is a 1 cm rounded high-density area in the left occipital region which is stable compared  to yesterday's postcontrast study. High-density was also subtly seen on precontrast scan from the same day. Superficial, likely subarachnoid high-density in the anterior interhemispheric fissure. Small bilateral cerebral convexity subdural hygromas without significant mass effect. 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 Chronic small-vessel ischemic change in the cerebral white matter. Vascular: Atherosclerotic calcifications. Skull: Remote left posterior frontal craniotomy. No acute or aggressive process. Sinuses/Orbits: Chronic right mastoid effusion,  present since at least 2013. Negative nasopharynx. IMPRESSION: 1. Stable, limited exam. 2. 1 cm left occipital high density area is unchanged from prior, potentially hemorrhage or mineralization rather than enhancement suspected yesterday. Recommend continued close follow-up. 3. Stable small hemorrhage in the anterior interhemispheric fissure without mass effect 4. Parasagittal right frontal and left occipital parenchymal edema which could be cytotoxic or vasogenic. Electronically Signed   By: Monte Fantasia M.D.   On: 02/29/2016 17:32   Ct Head Wo Contrast  Result Date: 02/28/2016 CLINICAL DATA:  Posterior neck pain after fall today. No loss of consciousness. EXAM: CT HEAD WITHOUT CONTRAST CT CERVICAL SPINE WITHOUT CONTRAST TECHNIQUE: Multidetector CT imaging of the head and cervical spine was performed following the standard protocol without intravenous contrast. Multiplanar CT image reconstructions of the cervical spine were also generated. COMPARISON:  CT scan of head of January 12, 2012. FINDINGS: CT HEAD FINDINGS Status post left frontal craniotomy. Otherwise bony calvarium appears intact. Fluid is noted in right mastoid air cells which is unchanged compared to prior exam. Mild diffuse cortical atrophy is noted. Mild chronic ischemic white matter disease is noted. 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 Ventricular size is within normal limits. No midline shift is noted. New focal hyperdensity is noted in right anterior parafalcine region concerning for possible subarachnoid hemorrhage. There is interval development of right frontal subcortical white matter edema concerning for acute infarction or possible neoplasm. MRI is recommended for further evaluation. CT CERVICAL SPINE FINDINGS No fracture or significant spondylolisthesis is noted. Severe degenerative disc disease is noted at C5-6 and C6-7 with anterior osteophyte formation. Degenerative changes seen involving the  posterior facet joints bilaterally. Visualized upper lung fields appear normal. IMPRESSION: Mild diffuse cortical atrophy. Mild chronic ischemic white matter disease. Postsurgical changes seen in left frontal region. There is interval development of right frontal subcortical white matter low density with mass effect concerning for edema ; it is uncertain if this is due to acute infarction or neoplasm. MRI is recommended for further evaluation. Probable right parafalcine subarachnoid hemorrhage is noted as well. Severe multilevel degenerative disc disease is noted. No acute abnormality seen in the cervical spine. Critical Value/emergent results were called by telephone at the time of interpretation on 02/28/2016 at 5:25 pm to Dr. Thurnell Garbe, who verbally acknowledged these results. Electronically Signed   By: Marijo Conception, M.D.   On: 02/28/2016 17:26   Ct Head W Contrast  Result Date: 02/28/2016 CLINICAL DATA:  Follow-up for abnormal head CT. Possible tumor. History is features. EXAM: CT HEAD WITH CONTRAST TECHNIQUE: Contiguous axial images were obtained from the base of the skull through the vertex with intravenous contrast. CONTRAST:  <Contrast> COMPARISON:  Prior CT from earlier same day. FINDINGS: Study moderately degraded by motion artifact. Previously noted area of vasogenic edema within the anterior medial right frontal lobe again seen. Adjacent hyperdensity along the right parafalcine falx, previously questioned to be acute subarachnoid hemorrhage, again seen, although better evaluated on prior study. No significant enhancement appreciated within this region, however, evaluation  fairly limited due to extensive motion artifact. An underlying mass is suspected. There is an additional area of vasogenic edema within the left occipital lobe. Within this area, there is a round enhancing mass measuring 9 mm (series 2, image 12). Given the presence of 2 lesions, findings are concerning for possible metastatic  disease. No other definite mass is identified on this motion degraded study. Encephalomalacia involving the anterior inferior left frontal lobe again noted. Atrophy with chronic microvascular ischemic changes are stable. No other acute intracranial process since prior study. No midline shift or hydrocephalus. Scalp soft tissues within normal limits. No acute abnormality about the globes and orbits. Paranasal sinuses and mastoids are stable. Chronic right mastoid effusion noted. Calvarium unchanged. IMPRESSION: 1. 9 mm enhancing mass at the left occipital lobe with surrounding vasogenic edema. 2. Stable vasogenic edema within the anteromedial right frontal lobe with adjacent right parafalcine hyperdensity. No significant enhancement within this region, although evaluation limited by motion artifact on this exam. A second underlying mass is suspected. Given the presence of 2 lesions, findings are highly suspicious for intracranial metastasis. Electronically Signed   By: Jeannine Boga M.D.   On: 02/28/2016 23:26   Ct Soft Tissue Neck W Contrast  Result Date: 03/01/2016 CLINICAL DATA:  History of bladder cancer. Altered mental status. Suspected brain metastasis of unknown origin. EXAM: CT NECK WITH CONTRAST TECHNIQUE: Multidetector CT imaging of the neck was performed using the standard protocol following the bolus administration of intravenous contrast. CONTRAST:  127mL ISOVUE-300 IOPAMIDOL (ISOVUE-300) INJECTION (administered for both neck and body imaging. COMPARISON:  None. FINDINGS: Pharynx and larynx: No suspicious asymmetry or enhancement. Salivary glands: Parotidectomy on the right. Flat scar-like opacity in the resection bed without concerning nodule. Abnormal right submandibular gland, favor fatty atrophy over excision. Thyroid: Negative Lymph nodes: None enlarged or abnormal density. Vascular: Atheromatous changes with patent major vessels. Porta catheter into the right internal jugular vein.  Limited intracranial: Known 10 to 11 mm high-density masslike area in the left occipital lobe is unchanged compared to previous. Visualized orbits: Negative where seen Mastoids and visualized paranasal sinuses: Chronic right mastoiditis and middle ear opacification. Question if this is related to previous radiotherapy. Skeleton: Degenerative changes.  No acute or aggressive process. Upper chest: Reported separately IMPRESSION: 1. No evidence of malignancy in the neck. 2. Right parotidectomy. 3. Chronic right otomastoiditis. Electronically Signed   By: Monte Fantasia M.D.   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 Additional history of urothelial carcinoma treated with chemotherapy, most recently in June 2017. EXAM: CT CHEST, ABDOMEN, AND PELVIS WITH CONTRAST TECHNIQUE: Multidetector CT imaging of the chest, abdomen and pelvis was performed following the standard protocol during bolus administration of intravenous contrast. CONTRAST:  169mL ISOVUE-300 IOPAMIDOL (ISOVUE-300) INJECTION 61% COMPARISON:  12/1915 CT chest, abdomen and pelvis. FINDINGS: Motion degraded scan. CT CHEST FINDINGS Cardiovascular: Stable top-normal heart size. No significant pericardial fluid/thickening. Right internal jugular MediPort terminates at the cavoatrial junction. Stable configuration of 3 lead left subclavian pacemaker with lead tips in the right atrium and right ventricle. Left anterior descending and left circumflex coronary atherosclerosis. Atherosclerotic nonaneurysmal thoracic aorta. Stable dilated main pulmonary artery (3.7 cm diameter). No central pulmonary emboli. Mediastinum/Nodes: No discrete thyroid nodules. Unremarkable esophagus. No pathologically enlarged axillary, mediastinal  or  hilar lymph nodes. Lungs/Pleura: No pneumothorax. Trace layering bilateral pleural effusions. There are stable scattered calcified subcentimeter granulomas in both lungs. There is mild compressive atelectasis in the dependent bilateral lower lobes. No acute consolidative airspace disease, lung masses or new significant pulmonary nodules in the aerated portions of the lungs. Musculoskeletal: No aggressive appearing focal osseous lesions. Moderate thoracic spondylosis. Healed deformities in the left posterior ribs. CT ABDOMEN PELVIS FINDINGS Hepatobiliary: Normal liver with no liver mass. Gallbladder is filled with calcified gas-filled gallstones, with no gallbladder wall thickening or pericholecystic fluid. No biliary ductal dilatation. Pancreas: Normal, with no mass or duct dilation. Spleen: Normal size. No mass. Adrenals/Urinary Tract: Normal adrenals. There is new mild right hydroureteronephrosis to the level of the proximal right ureter. No stone or gross obstructing mass is seen in the right urinary tract. No left hydronephrosis. Simple 1.2 cm posterior interpolar left renal cyst. No additional renal lesions. Collapsed bladder with stable chronic diffuse bladder wall thickening. Stomach/Bowel: Grossly normal stomach. Normal caliber small bowel with no small bowel wall thickening. Normal appendix. Diffuse colonic diverticulosis, with no large bowel wall thickening or pericolonic fat stranding. Vascular/Lymphatic: Atherosclerotic nonaneurysmal abdominal aorta. Patent portal, splenic, hepatic and renal veins. New mildly enlarged 1.0 cm aortocaval node (series 3/ image 63). No additional pathologically enlarged abdominopelvic nodes. Reproductive: Normal size prostate. Other: No pneumoperitoneum, ascites or focal fluid collection. Moderate fat containing right inguinal hernia, stable. Musculoskeletal: No aggressive appearing focal osseous lesions. Moderate lumbar spondylosis. IMPRESSION: 1. Limited motion degraded  scan. 2. New mild right hydroureteronephrosis to the level of the proximal right ureter. No stones or obvious obstructing mass is seen in this location. A urothelial neoplasm cannot be excluded in the right proximal ureter. Urology consultation advised. Consider correlation with retrograde pyelogram and/or short-term follow-up outpatient hematuria protocol CT abdomen/pelvis without and with IV contrast when the patient is able to remain still. 3. New mildly enlarged aortocaval retroperitoneal node, nonspecific, nodal metastasis not excluded. 4. No evidence of metastatic disease in the chest. 5. Additional findings include trace layering bilateral pleural effusions, aortic atherosclerosis, coronary atherosclerosis, stable dilated main pulmonary artery suggesting chronic pulmonary hypertension, cholelithiasis, diffuse colonic diverticulosis, stable chronic mild diffuse bladder wall thickening and moderate fat containing right inguinal hernia. Electronically Signed   By: Delbert Phenix M.D.   On: 03/01/2016 18:31   Ct Cervical Spine Wo Contrast  Result Date: 02/28/2016 CLINICAL DATA:  Posterior neck pain after fall today. No loss of consciousness. EXAM: CT HEAD WITHOUT CONTRAST CT CERVICAL SPINE WITHOUT CONTRAST TECHNIQUE: Multidetector CT imaging of the head and cervical spine was performed following the standard protocol without intravenous contrast. Multiplanar CT image reconstructions of the cervical spine were also generated. COMPARISON:  CT scan of head of January 12, 2012. FINDINGS: CT HEAD FINDINGS Status post left frontal craniotomy. Otherwise bony calvarium appears intact. Fluid is noted in right mastoid air cells which is unchanged compared to prior exam. Mild diffuse cortical atrophy is noted. Mild chronic ischemic white matter disease is noted. 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 Ventricular size is within normal limits. No midline shift is noted. New focal hyperdensity is  noted in right anterior parafalcine region concerning for possible subarachnoid hemorrhage. There is interval development of right frontal subcortical white matter edema concerning for acute infarction or possible neoplasm. MRI is recommended for further evaluation. CT CERVICAL SPINE FINDINGS No fracture or significant spondylolisthesis is noted. Severe degenerative disc disease is noted at C5-6 and C6-7 with anterior osteophyte  formation. Degenerative changes seen involving the posterior facet joints bilaterally. Visualized upper lung fields appear normal. IMPRESSION: Mild diffuse cortical atrophy. Mild chronic ischemic white matter disease. Postsurgical changes seen in left frontal region. There is interval development of right frontal subcortical white matter low density with mass effect concerning for edema ; it is uncertain if this is due to acute infarction or neoplasm. MRI is recommended for further evaluation. Probable right parafalcine subarachnoid hemorrhage is noted as well. Severe multilevel degenerative disc disease is noted. No acute abnormality seen in the cervical spine. Critical Value/emergent results were called by telephone at the time of interpretation on 02/28/2016 at 5:25 pm to Dr. Thurnell Garbe, who verbally acknowledged these results. Electronically Signed   By: Marijo Conception, M.D.   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 Additional history of urothelial carcinoma treated with chemotherapy, most recently in June 2017. EXAM: CT CHEST, ABDOMEN, AND PELVIS WITH CONTRAST TECHNIQUE: Multidetector CT imaging of the chest, abdomen and pelvis was performed following the standard protocol during bolus administration of intravenous contrast. CONTRAST:   176mL ISOVUE-300 IOPAMIDOL (ISOVUE-300) INJECTION 61% COMPARISON:  12/1915 CT chest, abdomen and pelvis. FINDINGS: Motion degraded scan. CT CHEST FINDINGS Cardiovascular: Stable top-normal heart size. No significant pericardial fluid/thickening. Right internal jugular MediPort terminates at the cavoatrial junction. Stable configuration of 3 lead left subclavian pacemaker with lead tips in the right atrium and right ventricle. Left anterior descending and left circumflex coronary atherosclerosis. Atherosclerotic nonaneurysmal thoracic aorta. Stable dilated main pulmonary artery (3.7 cm diameter). No central pulmonary emboli. Mediastinum/Nodes: No discrete thyroid nodules. Unremarkable esophagus. No pathologically enlarged axillary, mediastinal or hilar lymph nodes. Lungs/Pleura: No pneumothorax. Trace layering bilateral pleural effusions. There are stable scattered calcified subcentimeter granulomas in both lungs. There is mild compressive atelectasis in the dependent bilateral lower lobes. No acute consolidative airspace disease, lung masses or new significant pulmonary nodules in the aerated portions of the lungs. Musculoskeletal: No aggressive appearing focal osseous lesions. Moderate thoracic spondylosis. Healed deformities in the left posterior ribs. CT ABDOMEN PELVIS FINDINGS Hepatobiliary: Normal liver with no liver mass. Gallbladder is filled with calcified gas-filled gallstones, with no gallbladder wall thickening or pericholecystic fluid. No biliary ductal dilatation. Pancreas: Normal, with no mass or duct dilation. Spleen: Normal size. No mass. Adrenals/Urinary Tract: Normal adrenals. There is new mild right hydroureteronephrosis to the level of the proximal right ureter. No stone or gross obstructing mass is seen in the right urinary tract. No left hydronephrosis. Simple 1.2 cm posterior interpolar left renal cyst. No additional renal lesions. Collapsed bladder with stable chronic diffuse bladder wall  thickening. Stomach/Bowel: Grossly normal stomach. Normal caliber small bowel with no small bowel wall thickening. Normal appendix. Diffuse colonic diverticulosis, with no large bowel wall thickening or pericolonic fat stranding. Vascular/Lymphatic: Atherosclerotic nonaneurysmal abdominal aorta. Patent portal, splenic, hepatic and renal veins. New mildly enlarged 1.0 cm aortocaval node (series 3/ image 63). No additional pathologically enlarged abdominopelvic nodes. Reproductive: Normal size prostate. Other: No pneumoperitoneum, ascites or focal fluid collection. Moderate fat containing right inguinal hernia, stable. Musculoskeletal: No aggressive appearing focal osseous lesions. Moderate lumbar spondylosis. IMPRESSION: 1. Limited motion degraded scan. 2. New mild right hydroureteronephrosis to the level of the proximal right ureter. No stones or obvious obstructing mass is seen in this  location. A urothelial neoplasm cannot be excluded in the right proximal ureter. Urology consultation advised. Consider correlation with retrograde pyelogram and/or short-term follow-up outpatient hematuria protocol CT abdomen/pelvis without and with IV contrast when the patient is able to remain still. 3. New mildly enlarged aortocaval retroperitoneal node, nonspecific, nodal metastasis not excluded. 4. No evidence of metastatic disease in the chest. 5. Additional findings include trace layering bilateral pleural effusions, aortic atherosclerosis, coronary atherosclerosis, stable dilated main pulmonary artery suggesting chronic pulmonary hypertension, cholelithiasis, diffuse colonic diverticulosis, stable chronic mild diffuse bladder wall thickening and moderate fat containing right inguinal hernia. Electronically Signed   By: Ilona Sorrel M.D.   On: 03/01/2016 18:31   Dg Pelvis Portable  Result Date: 02/28/2016 CLINICAL DATA:  Fall.  Dysphasia. EXAM: PORTABLE PELVIS 1-2 VIEWS COMPARISON:  01/08/2016 FINDINGS: Both hips appear  located. There is no displaced fracture identified. Mild degenerative changes are noted involving both hips. IMPRESSION: 1. No acute findings. 2. If there is high clinical suspicion for occult fracture or the patient refuses to weightbear, consider further evaluation with MRI. Although CT is expeditious, evidence is lacking regarding accuracy of CT over plain film radiography. Electronically Signed   By: Kerby Moors M.D.   On: 02/28/2016 15:16   Dg Chest Portable 1 View  Result Date: 02/28/2016 CLINICAL DATA:  Aphasia. EXAM: PORTABLE CHEST 1 VIEW COMPARISON:  Radiographs of August 27, 2015. FINDINGS: The heart size and mediastinal contours are within normal limits. Both lungs are clear. Aortic atherosclerosis is noted. Right-sided Port-A-Cath and left-sided pacemaker are unchanged in position. No pneumothorax or pleural effusion is noted. The visualized skeletal structures are unremarkable. IMPRESSION: No acute cardiopulmonary abnormality seen.  Aortic atherosclerosis. Electronically Signed   By: Marijo Conception, M.D.   On: 02/28/2016 15:16      IMPRESSION/PLAN: 1. Abnormal Brain CT. The patient's case was presented at our multidisciplinary brain oncology conference yesterday including neuroradiology, three neurosurgeons, and two radiation oncologists. In reviewing the imaging (limited by the fact we cannot recommend MRI), the patient's anomalies enchance on non-contrasted studies, though not on the contrasted images. At the conclusion of the discussion, recommendations were made to repeat a CT with and without contrast with SRS protocol in 2 months. Our brain navigator will coordinate this with the patient and family, and Dr. Alen Blew will continue to follow him for his history of bladder cancer. I have shared the discussion with the patient, as well as his daughter. We will be available as needed as well moving forward. 2. Seizure activity. Neurology continues to follow the patient and is in the process of  working up the etiology of his seizure activity, though his EEG has been negative thus far. He continues with prophylaxis with Keppra BID.  In a visit lasting 60 minutes, greater than 50% of the time was spent discussing the patient's care with his daughter and explaining the rationale for repeat imaging rather than radiation treatment.    Carola Rhine, PAC

## 2016-03-03 NOTE — Clinical Social Work Note (Signed)
Clinical Social Work Assessment  Patient Details  Name: [name] MRN: [id number] Date of Birth: 07/10/1929  Date of referral:  03/02/16               Reason for consult:  Facility Placement                Permission sought to share information with:  Chartered certified accountant granted to share information::  Yes, Verbal Permission Granted  Name::     Electrical engineer::  SNFs  Relationship::  dtr  Contact Information:     Housing/Transportation Living arrangements for the past 2 months:  Single Family Home Source of Information:  Patient Patient Interpreter Needed:  None Criminal Activity/Legal Involvement Pertinent to Current Situation/Hospitalization:  No - Comment as needed Significant Relationships:  Adult Children Lives with:  Self Do you feel safe going back to the place where you live?  No Need for family participation in patient care:  Yes (Comment) (decision making)  Care giving concerns:  Pt lives alone and does not have sufficient physical support to return home with current level of impairment   Facilities manager / plan:  CSW spoke with pt dtr concerning plan for patient at time of DC- discussed PT recommendation for SNF.  Dtr explained that pt has been to SNF a few other times with them most recent stay being in March following a hospital admission.    Employment status:  Retired Forensic scientist:  Medicare PT Recommendations:  [place] / Referral to community resources:  [place]  Patient/Family's Response to care:  Dtr understands that patient is not safe to return home and is agreeable to SNF stay after this admission as well to increase strength and independence.  Patient/Family's Understanding of and Emotional Response to Diagnosis, Current Treatment, and Prognosis: Dtr is very comfortable discussing patients cancer diagnosis but seems frustrated by the continued set backs in the  patients medical course- hopeful that patient will be able to return home quickly from rehab.  Emotional Assessment Appearance:  Appears stated age Attitude/Demeanor/Rapport:  Unable to Assess Affect (typically observed):  Unable to Assess Orientation:  Oriented to Self Alcohol / Substance use:  Not Applicable Psych involvement (Current and /or in the community):  No (Comment)  Discharge Needs  Concerns to be addressed:  Care Coordination Readmission within the last 30 days:  No Current discharge risk:  Physical Impairment Barriers to Discharge:  Continued Medical Work up   Jorge Ny, LCSW 03/03/2016, 8:22 AM
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
## 2016-03-03 NOTE — Progress Notes (Signed)
Pt BP elevated, MD notified at 0700.

## 2016-03-03 NOTE — Progress Notes (Addendum)
PROGRESS NOTE  [name]  [id number] DOB: [birth year]  DOA: 02/28/2016 PCP: [name], MD  Primary Cardiologist: Dr. Sanda Klein Primary Oncologist: Dr. Zola Button Primary Urologist: Dr. Kathie Rhodes Primary Neurosurgeon: Dr. Newman Pies.   Brief Narrative:  82 y.o. male, single, his daughter [name] lives with him, ambulates with the help of a cane and walker, PMH of atrial fibrillation not on anticoagulation due to previous subdural hematoma, pacemaker implanted 1990 for sinus node dysfunction, mild-to-moderate degenerative aortic stenosis, subdural hematoma, diet controlled diabetes, transitional cell carcinoma with intermittent hematuria, s/p chemotherapy, TIA, HLD, HTN, presented to [facility] ED on 02/28/16 following an episode of passing out at home and was found on the floor by his daughter. History of an episode of being found on the floor 4 days prior to current event and an episode of transient speech difficulties 2 days prior to admission. Had witnessed seizure in ED. CT head with contrast showed 2 masses in left occipital lobe and right frontal lobe with vasogenic edema strongly suspicious for metastatic disease. Neurology and medical oncology consulted. Discussed with neurosurgery. Admitted to stepdown for further management. Clinically improved. Transferred to medical floor 9/11. As per review of patient's case in multidisciplinary brain oncology conference on 9/11, not fully convinced whether the brain lesions are metastasis or not. Hence plans by oncology to follow up with repeat imaging studies in 2 months. Patient with significant sundowning/delirium at night where he refused care and medications for the last 2 nights. Adjusting AEDs and meds, monitor overnight to make sure that he does take his medications then possible discharge to [facility] 9/13.  Assessment & Plan:   Principal Problem:   Seizures ([place]) Active Problems:   Pacemaker   Diabetes mellitus ([place])  Hypertension   Dyslipidemia   ETOH abuse   Permanent atrial fibrillation ([name]   Cancer of renal pelvis ([name]   Coronary artery disease   Thrombocytopenia ([name]   Hematuria   Syncope   Hypokalemia   Seizures (new onset), and fall at home - What was felt to syncope was likely new onset seizures related to newly diagnosed brain metastasis. - CT head with contrast 02/28/16:9 mm enhancing mass left occipital lobe with vasogenic edema. Stable vasogenic edema right frontal lobe-secondary underlying mass is suspected. Highly suspicious for intracranial metastasis. - Repeat head CT 02/29/16:1 cm left occipital high-density 8 year is unchanged from prior. Parasagittal right frontal and left occipital parenchymal edema which could be cytotoxic or vasogenic. As per neurology follow-up, he has a mild subarachnoid or subdural hematoma along the falx. - Neurology was consulted and patient was started on Keppra and Decadron. - EEG 02/29/16: Normal without seizures or seizure predisposition. - 2-D echo: LVEF 60-65%. Moderate AS - Patient's pacemaker prevents [place] from getting MRI of brain (checked with [employer] on 9/9) - Discussed with neurosurgeon on call 9/9: The brain lesions are nonsurgical but may be amenable to stereotactic radiosurgery and recommend oncological evaluation - 2 oncologists consulted in the hospital. Discussed with neurosurgeon on call a couple days ago. Finally patient's case was discussed at multidisciplinary brain oncology conference on 03/03/16 and I have copied there recommendations below:  "Abnormal Brain CT. The patient's case was presented at our multidisciplinary brain oncology conference yesterday including neuroradiology, three neurosurgeons, and two radiation oncologists. In reviewing the imaging (limited by the fact we cannot recommend MRI), the patient's anomalies enchance on non-contrasted studies, though not on the contrasted images. At the conclusion of the discussion,  recommendations were  made to repeat a CT with and without contrast with SRS protocol in 2 months. Our brain navigator will coordinate this with the patient and family, and Dr. Alen Blew will continue to follow him for his history of bladder cancer."  - Discussed with Dr. Alen Blew and Dr. Leonel Ramsay today: In light of above information i.e. brain lesions not definitively being metastases and patient having significant issues with delirium especially at nights, decided to taper down Decadron to 4 mg twice a day 1 week, then 4 MG daily and follow with Oncology. Also Keppra can contribute to delirium hence changed Keppra to Depakote. Will need monitoring of Depakote level and LFTs periodically.  - Monitor overnight and if no significant delirium, cooperates with care and taking medications, possible DC to SNF 9/13   Delirium - Possibly multifactorial: Post ictal from seizures in the ED, brain metases, medications (Ativan). Less likely to be from alcohol withdrawal yet. Possible underlying mild dementia.  - Please see discussion above. Seems much coherent and reasonable in the daytime but significant delirium and sundowning at night.  Dehydration with hyponatremia - Started diet per speech therapy recommendations - Dehydration resolved. Hyponatremia stable.    Hypokalemia - Replaced  Hypomagnesemia - replaced  Thrombocytopenia and anemia - Seems chronic. Stable  Elevated troponin - Unclear etiology.? Demand ischemia. No reported chest pain. - Cycle troponin> mild elevation and mostly flat trend. - ? Related to brain event/seizures  Alcohol dependence - Patient apparently drinks 4 mixed drinks daily. Has had prior history of alcohol withdrawal. - CIWA protocol  Permanent atrial fibrillation/pacemaker - Controlled ventricular rate. Not anticoagulation candidate secondary to previous subdural hematoma. Patient on Plavix at home-as per neurology recommendations, hold Plavix for 1 week  >would resume on 03/07/16.   Essential hypertension -Controlled at home off of meds. Monitor without medications for now. Fluctuating. Started low-dose amlodipine. Uncontrolled. Adjust amlodipine.  Diet controlled DM - Monitor CBGs and started SSI. .   check A1c again: 5.5. Now complicated by steroids  TIA history - Plavix held due to subarachnoid/subdural hematoma. Resume Plavix 03/07/16.  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 The urothelial neoplasm cannot be excluded in the right proximal ureter. Aorta caval retroperitoneal node. No evidence of malignancy in the neck. Discussed with Dr. Alen Blew- will follow up hydroureteronephrosis as OP. - Please see above discussion regarding management and outpatient follow-up oncologic issues  Dysphagia - Started on dysphagia 1 diet and thin liquids per speech therapy.   Prolonged QTC - QTC 562 on 9/8. Repeat EKG. Avoid precipitating medications as far as possible.   DVT prophylaxis: SCDs Code Status: Full Family Communication: Discussed extensively with patient's daughter Mrs. Sherrie at bedside.  Disposition Plan: Admitted to SDU. Transferred to Med floor on 9/11. DC to SNF when patient's delirium improves and is consistently taking his medications. Possibly 9/13.   Consultants:   Neurology  Medical Oncology  Procedures:   2-D echo: Study Conclusions  - Procedure narrative: Transthoracic echocardiography. Image   quality was poor. The study was technically difficult, as a   result of poor sound wave transmission. - Left ventricle: The cavity size was normal. Systolic function was   normal. The estimated ejection fraction was in the range of 60%   to 65%. The study is not technically sufficient to allow   evaluation of LV diastolic function. Doppler parameters are   consistent with high  ventricular filling pressure. Moderate   concentric and severe focal basal septal hypertrophy. - Aortic valve: Moderately calcified annulus. Moderately thickened,   moderately calcified leaflets. There was moderate stenosis. There   was mild regurgitation. Peak velocity (S): 299 cm/s. Mean   gradient (S): 18 mm Hg. Valve area (VTI): 1.4 cm^2. Valve area   (Vmax): 1.23 cm^2. Valve area (Vmean): 1.45 cm^2. - Mitral valve: Calcified annulus. Mildly calcified leaflets .   There was mild regurgitation. Valve area by pressure half-time:   2.2 cm^2. - Left atrium: The atrium was moderately dilated. - Right ventricle: The cavity size was mildly dilated. Pacer wire   or catheter noted in right ventricle. Systolic function was   moderately reduced. - Right atrium: The atrium was moderately dilated. - Tricuspid valve: There was moderate regurgitation. - Pulmonary arteries: PA peak pressure: 85 mm Hg (S). - Inferior vena cava: The vessel was dilated. The respirophasic   diameter changes were blunted (< 50%), consistent with elevated   central venous pressure.  Antimicrobials:   None    Subjective: Overnight events noted. Confused, agitated, hitting at staff. This morning calm and cooperative. Oriented to person and place. Has no recollection of last night events. Denies complaints.  Objective:  Vitals:   03/02/16 1244 03/02/16 1433 03/02/16 2156 03/03/16 0658  BP: (!) 151/87 (!) 151/62 (!) 166/77 (!) 190/85  Pulse:  60 73 65  Resp:  18 18 18   Temp:  97.7 F (36.5 C) 97.7 F (36.5 C) 97.7 F (36.5 C)  TempSrc:  Oral Oral   SpO2:  100% 99% (!) 69%  Weight:      Height:        Intake/Output Summary (Last 24 hours) at 03/03/16 1220 Last data filed at 03/03/16 0947  Gross per 24 hour  Intake              240 ml  Output             1550 ml  Net            -1310 ml   Filed Weights   02/28/16 1422 02/29/16 0500 03/01/16 0500  Weight: 86.2 kg (190 lb) 90 kg (198 lb 6.4 oz) 86.7 kg  (191 lb 3.2 oz)    Examination:  General exam: Moderately built and nourished, chronically ill-looking, sitting up comfortably in bed .  Respiratory system: Clear to auscultation. Respiratory effort normal. Cardiovascular system: S1 & S2 heard, irregularly irregular. No JVD, murmurs, rubs, gallops or clicks. No pedal edema. Telemetry: A. Fib with CVR and intermittently V paced rhythm> DC'ed 9/12..  Gastrointestinal system: Abdomen is nondistended, soft and nontender. No organomegaly or masses felt. Normal bowel sounds heard. Condom catheter with straw-colored urine and no gross hematuria. Central nervous system: Alert and oriented 2. No focal neurological deficits.Extremely hard of hearing from right ear.  Extremities: Symmetric 5 x 5 power. Extensive bruising at different stages on both upper extremities, some on lower extremities with hyperpigmentation changes of legs. Skin: as above Psychiatry: Judgement and insight appear impaired. Mood & affect : pleasant and appropriate     Data Reviewed: I have personally reviewed following labs and imaging studies  CBC:  Recent Labs Lab 02/28/16 1630 02/29/16 0655 03/01/16 0213  WBC 9.0 8.2 8.5  NEUTROABS 8.1*  --   --   HGB 13.2 11.7* 12.0*  HCT 37.9* 35.4* 35.2*  MCV 109.5* 110.6* 111.0*  PLT 109* 107* [id number]*   Basic Metabolic Panel:  Recent Labs  Lab 02/28/16 1630 02/29/16 0044 02/29/16 0655 03/01/16 0213  NA 134*  --  136 133*  K 3.2*  --  3.3* 4.5  CL 97*  --  99* 104  CO2 29  --  25 24  GLUCOSE 187*  --  114* 215*  BUN 11  --  9 12  CREATININE 1.03  --  0.97 0.96  CALCIUM 8.8*  --  8.1* 8.2*  MG  --  1.2*  --  1.9   GFR: Estimated Creatinine Clearance: 62.5 mL/min (by C-G formula based on SCr of 0.96 mg/dL). Liver Function Tests:  Recent Labs Lab 02/28/16 1630  AST 29  ALT 12*  ALKPHOS 113  BILITOT 1.8*  PROT 7.3  ALBUMIN 3.4*   No results for input(s): LIPASE, AMYLASE in the last 168 hours. No results for  input(s): AMMONIA in the last 168 hours. Coagulation Profile:  Recent Labs Lab 02/28/16 1630  INR 1.08   Cardiac Enzymes:  Recent Labs Lab 02/28/16 1630 02/29/16 0151 02/29/16 0655 02/29/16 1158  TROPONINI 0.09* 0.15* 0.13* 0.09*   BNP (last 3 results) No results for input(s): PROBNP in the last 8760 hours. HbA1C:  Recent Labs  03/02/16 1130  HGBA1C 5.5   CBG:  Recent Labs Lab 03/02/16 1652 03/02/16 2149 03/02/16 2255 03/03/16 0756 03/03/16 1133  GLUCAP 176* 248* 254* 152* 200*   Lipid Profile: No results for input(s): CHOL, HDL, LDLCALC, TRIG, CHOLHDL, LDLDIRECT in the last 72 hours. Thyroid Function Tests: No results for input(s): TSH, T4TOTAL, FREET4, T3FREE, THYROIDAB in the last 72 hours. Anemia Panel: No results for input(s): VITAMINB12, FOLATE, FERRITIN, TIBC, IRON, RETICCTPCT in the last 72 hours.  Sepsis Labs: No results for input(s): PROCALCITON, LATICACIDVEN in the last 168 hours.  Recent Results (from the past 240 hour(s))  MRSA PCR Screening     Status: Abnormal   Collection Time: 02/29/16 10:33 AM  Result Value Ref Range Status   MRSA by PCR POSITIVE (A) NEGATIVE Final    Comment:        The GeneXpert MRSA Assay (FDA approved for NASAL specimens only), is one component of a comprehensive MRSA colonization surveillance program. It is not intended to diagnose MRSA infection nor to guide or monitor treatment for MRSA infections. RESULT CALLED TO, READ BACK BY AND VERIFIED WITH: [facility] RN AT 1242 02/29/16 BY A.[name]          Radiology Studies: Ct Soft Tissue Neck W Contrast  Result Date: 03/01/2016 CLINICAL DATA:  History of bladder cancer. Altered mental status. Suspected brain metastasis of unknown origin. EXAM: CT NECK WITH CONTRAST TECHNIQUE: Multidetector CT imaging of the neck was performed using the standard protocol following the bolus administration of intravenous contrast. CONTRAST:  11mL ISOVUE-300 IOPAMIDOL (ISOVUE-300)  INJECTION (administered for both neck and body imaging. COMPARISON:  None. FINDINGS: Pharynx and larynx: No suspicious asymmetry or enhancement. Salivary glands: Parotidectomy on the right. Flat scar-like opacity in the resection bed without concerning nodule. Abnormal right submandibular gland, favor fatty atrophy over excision. Thyroid: Negative Lymph nodes: None enlarged or abnormal density. Vascular: Atheromatous changes with patent major vessels. Porta catheter into the right internal jugular vein. Limited intracranial: Known 10 to 11 mm high-density masslike area in the left occipital lobe is unchanged compared to previous. Visualized orbits: Negative where seen Mastoids and visualized paranasal sinuses: Chronic right mastoiditis and middle ear opacification. Question if this is related to previous radiotherapy. Skeleton: Degenerative changes.  No acute or aggressive process. Upper chest: Reported  separately IMPRESSION: 1. No evidence of malignancy in the neck. 2. Right parotidectomy. 3. Chronic right otomastoiditis. Electronically Signed   By: Monte Fantasia M.D.   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 Additional history of urothelial carcinoma treated with chemotherapy, most recently in June 2017. EXAM: CT CHEST, ABDOMEN, AND PELVIS WITH CONTRAST TECHNIQUE: Multidetector CT imaging of the chest, abdomen and pelvis was performed following the standard protocol during bolus administration of intravenous contrast. CONTRAST:  161mL ISOVUE-300 IOPAMIDOL (ISOVUE-300) INJECTION 61% COMPARISON:  12/1915 CT chest, abdomen and pelvis. FINDINGS: Motion degraded scan. CT CHEST FINDINGS Cardiovascular: Stable top-normal heart size. No significant pericardial fluid/thickening.  Right internal jugular MediPort terminates at the cavoatrial junction. Stable configuration of 3 lead left subclavian pacemaker with lead tips in the right atrium and right ventricle. Left anterior descending and left circumflex coronary atherosclerosis. Atherosclerotic nonaneurysmal thoracic aorta. Stable dilated main pulmonary artery (3.7 cm diameter). No central pulmonary emboli. Mediastinum/Nodes: No discrete thyroid nodules. Unremarkable esophagus. No pathologically enlarged axillary, mediastinal or hilar lymph nodes. Lungs/Pleura: No pneumothorax. Trace layering bilateral pleural effusions. There are stable scattered calcified subcentimeter granulomas in both lungs. There is mild compressive atelectasis in the dependent bilateral lower lobes. No acute consolidative airspace disease, lung masses or new significant pulmonary nodules in the aerated portions of the lungs. Musculoskeletal: No aggressive appearing focal osseous lesions. Moderate thoracic spondylosis. Healed deformities in the left posterior ribs. CT ABDOMEN PELVIS FINDINGS Hepatobiliary: Normal liver with no liver mass. Gallbladder is filled with calcified gas-filled gallstones, with no gallbladder wall thickening or pericholecystic fluid. No biliary ductal dilatation. Pancreas: Normal, with no mass or duct dilation. Spleen: Normal size. No mass. Adrenals/Urinary Tract: Normal adrenals. There is new mild right hydroureteronephrosis to the level of the proximal right ureter. No stone or gross obstructing mass is seen in the right urinary tract. No left hydronephrosis. Simple 1.2 cm posterior interpolar left renal cyst. No additional renal lesions. Collapsed bladder with stable chronic diffuse bladder wall thickening. Stomach/Bowel: Grossly normal stomach. Normal caliber small bowel with no small bowel wall thickening. Normal appendix. Diffuse colonic diverticulosis, with no large bowel wall thickening or pericolonic fat stranding. Vascular/Lymphatic:  Atherosclerotic nonaneurysmal abdominal aorta. Patent portal, splenic, hepatic and renal veins. New mildly enlarged 1.0 cm aortocaval node (series 3/ image 63). No additional pathologically enlarged abdominopelvic nodes. Reproductive: Normal size prostate. Other: No pneumoperitoneum, ascites or focal fluid collection. Moderate fat containing right inguinal hernia, stable. Musculoskeletal: No aggressive appearing focal osseous lesions. Moderate lumbar spondylosis. IMPRESSION: 1. Limited motion degraded scan. 2. New mild right hydroureteronephrosis to the level of the proximal right ureter. No stones or obvious obstructing mass is seen in this location. A urothelial neoplasm cannot be excluded in the right proximal ureter. Urology consultation advised. Consider correlation with retrograde pyelogram and/or short-term follow-up outpatient hematuria protocol CT abdomen/pelvis without and with IV contrast when the patient is able to remain still. 3. New mildly enlarged aortocaval retroperitoneal node, nonspecific, nodal metastasis not excluded. 4. No evidence of metastatic disease in the chest. 5. Additional findings include trace layering bilateral pleural effusions, aortic atherosclerosis, coronary atherosclerosis, stable dilated main pulmonary artery suggesting chronic pulmonary hypertension, cholelithiasis, diffuse colonic diverticulosis, stable chronic mild diffuse bladder wall thickening and moderate fat containing right inguinal hernia. Electronically Signed  By: Ilona Sorrel M.D.   On: 03/01/2016 18:31   Ct Abdomen Pelvis W Contrast  Result Date: 03/01/2016 CLINICAL DATA:  82 year old male inpatient with reported history of new brain metastasis of uncertain origin. 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 Additional history of urothelial carcinoma treated with chemotherapy, most recently in June 2017. EXAM: CT CHEST, ABDOMEN, AND PELVIS WITH  CONTRAST TECHNIQUE: Multidetector CT imaging of the chest, abdomen and pelvis was performed following the standard protocol during bolus administration of intravenous contrast. CONTRAST:  159mL ISOVUE-300 IOPAMIDOL (ISOVUE-300) INJECTION 61% COMPARISON:  12/1915 CT chest, abdomen and pelvis. FINDINGS: Motion degraded scan. CT CHEST FINDINGS Cardiovascular: Stable top-normal heart size. No significant pericardial fluid/thickening. Right internal jugular MediPort terminates at the cavoatrial junction. Stable configuration of 3 lead left subclavian pacemaker with lead tips in the right atrium and right ventricle. Left anterior descending and left circumflex coronary atherosclerosis. Atherosclerotic nonaneurysmal thoracic aorta. Stable dilated main pulmonary artery (3.7 cm diameter). No central pulmonary emboli. Mediastinum/Nodes: No discrete thyroid nodules. Unremarkable esophagus. No pathologically enlarged axillary, mediastinal or hilar lymph nodes. Lungs/Pleura: No pneumothorax. Trace layering bilateral pleural effusions. There are stable scattered calcified subcentimeter granulomas in both lungs. There is mild compressive atelectasis in the dependent bilateral lower lobes. No acute consolidative airspace disease, lung masses or new significant pulmonary nodules in the aerated portions of the lungs. Musculoskeletal: No aggressive appearing focal osseous lesions. Moderate thoracic spondylosis. Healed deformities in the left posterior ribs. CT ABDOMEN PELVIS FINDINGS Hepatobiliary: Normal liver with no liver mass. Gallbladder is filled with calcified gas-filled gallstones, with no gallbladder wall thickening or pericholecystic fluid. No biliary ductal dilatation. Pancreas: Normal, with no mass or duct dilation. Spleen: Normal size. No mass. Adrenals/Urinary Tract: Normal adrenals. There is new mild right hydroureteronephrosis to the level of the proximal right ureter. No stone or gross obstructing mass is seen in the  right urinary tract. No left hydronephrosis. Simple 1.2 cm posterior interpolar left renal cyst. No additional renal lesions. Collapsed bladder with stable chronic diffuse bladder wall thickening. Stomach/Bowel: Grossly normal stomach. Normal caliber small bowel with no small bowel wall thickening. Normal appendix. Diffuse colonic diverticulosis, with no large bowel wall thickening or pericolonic fat stranding. Vascular/Lymphatic: Atherosclerotic nonaneurysmal abdominal aorta. Patent portal, splenic, hepatic and renal veins. New mildly enlarged 1.0 cm aortocaval node (series 3/ image 63). No additional pathologically enlarged abdominopelvic nodes. Reproductive: Normal size prostate. Other: No pneumoperitoneum, ascites or focal fluid collection. Moderate fat containing right inguinal hernia, stable. Musculoskeletal: No aggressive appearing focal osseous lesions. Moderate lumbar spondylosis. IMPRESSION: 1. Limited motion degraded scan. 2. New mild right hydroureteronephrosis to the level of the proximal right ureter. No stones or obvious obstructing mass is seen in this location. A urothelial neoplasm cannot be excluded in the right proximal ureter. Urology consultation advised. Consider correlation with retrograde pyelogram and/or short-term follow-up outpatient hematuria protocol CT abdomen/pelvis without and with IV contrast when the patient is able to remain still. 3. New mildly enlarged aortocaval retroperitoneal node, nonspecific, nodal metastasis not excluded. 4. No evidence of metastatic disease in the chest. 5. Additional findings include trace layering bilateral pleural effusions, aortic atherosclerosis, coronary atherosclerosis, stable dilated main pulmonary artery suggesting chronic pulmonary hypertension, cholelithiasis, diffuse colonic diverticulosis, stable chronic mild diffuse bladder wall thickening and moderate fat containing right inguinal hernia. Electronically Signed   By: Ilona Sorrel M.D.   On:  03/01/2016 18:31        Scheduled  Meds: . amLODipine  2.5 mg Oral Daily  . Chlorhexidine Gluconate Cloth  6 each Topical Q0600  . dexamethasone  4 mg Oral Q12H  . divalproex  500 mg Oral Q12H  . folic acid  1 mg Oral Daily  . insulin aspart  0-5 Units Subcutaneous QHS  . insulin aspart  0-9 Units Subcutaneous TID WC  . mouth rinse  15 mL Mouth Rinse BID  . multivitamin with minerals  1 tablet Oral Daily  . mupirocin ointment  1 application Nasal BID  . pantoprazole  40 mg Oral Daily  . sodium chloride flush  10-40 mL Intracatheter Q12H  . sodium chloride flush  3 mL Intravenous Q12H  . thiamine  100 mg Oral Daily   Continuous Infusions:     LOS: 3 days    Time spent: 25 minutes.    [facility], MD [facility] Pager [phone]  If 7PM-7AM, please contact night-coverage [email or web address] Password [email or web address] 03/03/2016, 12:20 PM

## 2016-03-04 DIAGNOSIS — C651 Malignant neoplasm of right renal pelvis: Secondary | ICD-10-CM

## 2016-03-04 DIAGNOSIS — R7989 Other specified abnormal findings of blood chemistry: Secondary | ICD-10-CM

## 2016-03-04 DIAGNOSIS — Z950 Presence of cardiac pacemaker: Secondary | ICD-10-CM

## 2016-03-04 DIAGNOSIS — R569 Unspecified convulsions: Secondary | ICD-10-CM

## 2016-03-04 DIAGNOSIS — R778 Other specified abnormalities of plasma proteins: Secondary | ICD-10-CM

## 2016-03-04 LAB — GLUCOSE, CAPILLARY
GLUCOSE-CAPILLARY: 245 mg/dL — AB (ref 65–99)
Glucose-Capillary: 192 mg/dL — ABNORMAL HIGH (ref 65–99)

## 2016-03-04 MED ORDER — DIVALPROEX SODIUM 500 MG PO DR TAB: 500.0000 mg | DELAYED_RELEASE_TABLET | Freq: Two times a day (BID) | ORAL | 0 refills | Status: DC

## 2016-03-04 MED ORDER — DEXAMETHASONE 4 MG PO TABS: 4.0000 mg | ORAL_TABLET | Freq: Two times a day (BID) | ORAL | 0 refills | Status: DC

## 2016-03-04 MED ORDER — HEPARIN SOD (PORK) LOCK FLUSH 100 UNIT/ML IV SOLN
500.0000 [IU] | INTRAVENOUS | Status: AC | PRN
  Administered 2016-03-04: 500 [IU]

## 2016-03-04 MED ORDER — CLOPIDOGREL BISULFATE 75 MG PO TABS: 75.0000 mg | ORAL_TABLET | Freq: Every day | ORAL | 0 refills | Status: DC

## 2016-03-04 MED ORDER — AMLODIPINE BESYLATE 10 MG PO TABS: 10.0000 mg | ORAL_TABLET | Freq: Every day | ORAL | 0 refills | Status: DC

## 2016-03-04 NOTE — Progress Notes (Signed)
Physical Therapy Treatment Patient Details Name: [name] MRN: [id number] DOB: 02-Sep-1929 Today's Date: 03/04/2016    History of Present Illness Patient is an 82 yo male admitted 02/28/16 after being found on floor by daughter.  Patient with similar syncopal episode several days prior.  Patient with new seizures, metastatic brain lesions, and delerium.    PMH:  Afib, SDH with craniotomy, pacemaker, DM, transitional cell CA, TIA, HTN, ETOH use, CAD    PT Comments    Patient is making progress toward mobility goals and HR WNL with ambulation. ST-SNF remains appropriate.   Follow Up Recommendations  SNF;Supervision/Assistance - 24 hour     Equipment Recommendations  None recommended by PT    Recommendations for Other Services       Precautions / Restrictions Precautions Precautions: Fall Precaution Comments: syncope and seizures Restrictions Weight Bearing Restrictions: No    Mobility  Bed Mobility Overal bed mobility: Needs Assistance Bed Mobility: Supine to Sit     Supine to sit: Min guard;HOB elevated     General bed mobility comments: min guard for safety; pt a little impulsive   Transfers Overall transfer level: Needs assistance Equipment used: Rolling walker (2 wheeled) Transfers: Sit to/from Stand Sit to Stand: Min assist         General transfer comment: assist to power up into standing and cues for safe hand placement and use of AD  Ambulation/Gait Ambulation/Gait assistance: Min guard Ambulation Distance (Feet): 75 Feet Assistive device: Rolling walker (2 wheeled) Gait Pattern/deviations: Step-through pattern;Decreased stride length;Trunk flexed     General Gait Details: cues for posture and proximity of RW   Stairs            Wheelchair Mobility    Modified Rankin (Stroke Patients Only)       Balance     Sitting balance-Leahy Scale: Good       Standing balance-Leahy Scale: Fair Standing balance comment: for static  standing; poor dynamic                    Cognition Arousal/Alertness: Awake/alert Behavior During Therapy: WFL for tasks assessed/performed Overall Cognitive Status: No family/caregiver present to determine baseline cognitive functioning                      Exercises      General Comments        Pertinent Vitals/Pain Pain Assessment: No/denies pain    Home Living                      Prior Function            PT Goals (current goals can now be found in the care plan section) Acute Rehab PT Goals Patient Stated Goal: Unable to state Time For Goal Achievement: 03/14/16 Progress towards PT goals: Progressing toward goals    Frequency  Min 3X/week    PT Plan Current plan remains appropriate    Co-evaluation             End of Session Equipment Utilized During Treatment: Gait belt Activity Tolerance: Patient tolerated treatment well Patient left: with call bell/phone within reach;in chair;with chair alarm set     Time: [id number] PT Time Calculation (min) (ACUTE ONLY): 20 min  Charges:  $Gait Training: 8-22 mins                    G Codes:      [employer]  [place], PTA Pager: [phone]   03/04/2016, 1:45 PM

## 2016-03-04 NOTE — Clinical Social Work Placement (Signed)
   CLINICAL SOCIAL WORK PLACEMENT  NOTE  Date:  03/04/2016  Patient Details  Name: [name] MRN: [id number] Date of Birth: 10-12-1929  Clinical Social Work is seeking post-discharge placement for this patient at the [place] level of care (*CSW will initial, date and re-position this form in  chart as items are completed):  Yes   Patient/family provided with [facility] Work Department's list of facilities offering this level of care within the geographic area requested by the patient (or if unable, by the patient's family).  Yes   Patient/family informed of their freedom to choose among providers that offer the needed level of care, that participate in Medicare, Medicaid or managed care program needed by the patient, have an available bed and are willing to accept the patient.  Yes   Patient/family informed of [facility]'s ownership interest in [facility] and [facility], as well as of the fact that they are under no obligation to receive care at these facilities.  PASRR submitted to EDS on       PASRR number received on       Existing PASRR number confirmed on 03/02/16     FL2 transmitted to all facilities in geographic area requested by pt/family on 03/02/16     FL2 transmitted to all facilities within larger geographic area on       Patient informed that his/her managed care company has contracts with or will negotiate with certain facilities, including the following:        Yes   Patient/family informed of bed offers received.  Patient chooses bed at [facility], [place]     Physician recommends and patient chooses bed at      Patient to be transferred to [facility] on 03/04/16.  Patient to be transferred to facility by [facility]     Patient family notified on 03/04/16 of transfer.  Name of family member notified:  [name]     PHYSICIAN Please sign FL2     Additional Comment:     _______________________________________________ Benard Halsted, [place] 03/04/2016, 2:11 PM

## 2016-03-04 NOTE — Discharge Summary (Signed)
Physician Discharge Summary  [name] [id number] DOB: May 15, 1930 DOA: 02/28/2016  PCP: [name], MD  Admit date: 02/28/2016 Discharge date: 03/04/2016  Admitted From:  Home Disposition:  SNF  Recommendations for Outpatient Follow-up:  1. Follow up with PCP in 1-2 weeks 2. Please obtain BMP/CBC in one week     Discharge Condition:Stable CODE STATUS: FULL Diet recommendation: Heart Healthy   Brief/Interim Summary: 82 y.o.male, single, his daughter [name] lives with him, ambulates with the help of a cane and walker, PMH of atrial fibrillation not on anticoagulation due to previous subdural hematoma, pacemaker implanted 1990 for sinus node dysfunction, mild-to-moderate degenerative aortic stenosis, subdural hematoma, diet controlled diabetes, transitional cell carcinoma with intermittent hematuria, s/p chemotherapy, TIA, HLD, HTN, presented to [facility] ED on 02/28/16 following an episode of passing out at home and was found on the floor by his daughter. History of an episode of being found on the floor 4 days prior to current event and an episode of transient speech difficulties 2 days prior to admission. Had witnessed seizure in ED. CT head with contrast showed 2 masses in left occipital lobe and right frontal lobe with vasogenic edema strongly suspicious for metastatic disease. Neurology and medical oncology consulted. Discussed with neurosurgery. Admitted to stepdown for further management. Clinically improved. Transferred to medical floor 9/11. As per review of patient's case in multidisciplinary brain oncology conference on 9/11, not fully convinced whether the brain lesions are metastasis or not. Hence plans by oncology to follow up with repeat imaging studies in 2 months. Patient with significant sundowning/delirium at night where he refused care and medications for the last 2 nights.  After decreasing his dexamethasone and changing his antiepileptic medications, the  patient's agitation and delirium improved.  Discharge Diagnoses:  Seizures (new onset), and fall at home - What was felt to syncope was likely new onset seizures related to newly diagnosed brain metastasis. - CT head with contrast 02/28/16:9 mm enhancing mass left occipital lobe with vasogenic edema. Stable vasogenic edema right frontal lobe-secondary underlying mass is suspected. Highly suspicious for intracranial metastasis. - Repeat head CT 02/29/16:1 cm left occipital high-density 8 year is unchanged from prior. Parasagittal right frontal and left occipital parenchymal edema which could be cytotoxic or vasogenic. As per neurology follow-up, he has a mild subarachnoid or subdural hematoma along the falx. - Neurology was consulted and patient was started on Keppra and Decadron. - EEG 02/29/16: Normal without seizures or seizure predisposition. - 2-D echo: LVEF 60-65%. Moderate AS - Patient's pacemaker prevents [place] from getting MRI of brain (checked with [employer] on 9/9) - Discussed with neurosurgeon on call 9/9: The brain lesions are nonsurgical but may be amenable to stereotactic radiosurgery and recommend oncological evaluation - 2 oncologists consulted in the hospital. Discussed with neurosurgeon on call a couple days ago. Finally patient's case was discussed at multidisciplinary brain oncology conference on 03/03/16 and I have copied there recommendations below:  "Abnormal Brain CT. The patient's case was presented at our multidisciplinary brain oncology conference yesterday including neuroradiology, three neurosurgeons, and two radiation oncologists. In reviewing the imaging (limited by the fact we cannot recommend MRI), the patient's anomalies enchance on non-contrasted studies, though not on the contrasted images. At the conclusion of the discussion, recommendations were made to repeat a CT with and without contrast with SRS protocol in 2 months. Our brain navigator will coordinate this with  the patient and family, and Dr. Alen Blew will continue to follow him for his history of  bladder cancer."  - Discussed with Dr. Alen Blew and Dr. Leonel Ramsay today: In light of above information i.e. brain lesions not definitively being metastases and patient having significant issues with delirium especially at nights, decided to taper down Decadron to 4 mg twice a day 1 week, then 4 MG daily and follow with Oncology. Also Keppra can contribute to delirium hence changed Keppra to Depakote. Will need monitoring of Depakote level and LFTs periodically.  -Patient will continue on dexamethasone 4 mg twice a day for 6 more days, then 4 mg once daily thereafter. -The patient's medical oncologist has arranged for the patient to have outpatient follow-up   Delirium - Possibly multifactorial: Post ictal from seizures in the ED, brain metases, medications (Ativan). Less likely to be from alcohol withdrawal yet. Possible underlying mild dementia.  - Please see discussion above. Seems much coherent and reasonable in the daytime but significant delirium and sundowning at night. -With decrease of the patient's dexamethasone and adjusting the patient's antiepileptic medications, the patient's delirium and agitation improved.  Dehydration with hyponatremia - Started diet per speech therapy recommendations - Dehydration resolved. Hyponatremia stable.    Hypokalemia - Replaced  Hypomagnesemia - replaced  Thrombocytopenia and anemia - Seems chronic. Stable  Elevated troponin - Unclear etiology.? Demand ischemia. No reported chest pain. - Cycle troponin> mild elevation and mostly flat trend. - ? Related to brain event/seizures  Alcohol dependence - Patient apparently drinks 4 mixed drinks daily. Has had prior history of alcohol withdrawal. - CIWA protocol  Permanent atrial fibrillation/pacemaker - Controlled ventricular rate. Not anticoagulation candidate secondary to previous subdural hematoma.  Patient on Plavix at home-as per neurology recommendations, hold Plavix for 1 week >would resume on 03/07/16.   Essential hypertension -Controlled at home off of meds. Monitor without medications for now. Fluctuating. Started low-dose amlodipine. Uncontrolled. Adjust amlodipine.  Diet controlled DM - Monitor CBGs and started SSI. .  check A1c again: 5.5. Now complicated by steroids  TIA history - Plavix held due to subarachnoid/subdural hematoma. Resume Plavix 03/07/16.  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 The urothelial neoplasm cannot be excluded in the right proximal ureter. Aorta caval retroperitoneal node. No evidence of malignancy in the neck. Discussed with Dr. Alen Blew- will follow up hydroureteronephrosis as OP. - Please see above discussion regarding management and outpatient follow-up oncologic issues  Dysphagia - Started on dysphagia 1 diet and thin liquids per speech therapy.   Prolonged QTC - QTC 562 on 9/8. Repeat EKG. Avoid precipitating medications as far as possible.    Discharge Instructions  Discharge Instructions    Ambulatory referral to Neurology    Complete by:  As directed    An appointment is requested in approximately: 1 - 2 weeks   Diet - low sodium heart healthy    Complete by:  As directed    Increase activity slowly    Complete by:  As directed        Medication List    TAKE these medications   acetaminophen 500 MG tablet Commonly known as:  TYLENOL Take 500 mg by mouth daily as needed for mild pain.   amLODipine 10 MG tablet Commonly known as:  NORVASC Take 1 tablet (10 mg total) by mouth daily. Start taking on:  03/05/2016   clopidogrel 75 MG tablet Commonly known as:  PLAVIX Take 1 tablet (75 mg total) by mouth daily.  For Afib/TIA--restart 03/07/16 Start taking on:  03/07/2016 What changed:   additional instructions   dexamethasone 4 MG tablet Commonly known as:  DECADRON Take 1 tablet (4 mg total) by mouth every 12 (twelve) hours. X 6 days, then 4 mg once daily   divalproex 500 MG DR tablet Commonly known as:  DEPAKOTE Take 1 tablet (500 mg total) by mouth every 12 (twelve) hours.   mineral oil-hydrophilic petrolatum ointment Apply topically 2 (two) times daily. What changed:  how much to take  when to take this   oxybutynin 5 MG tablet Commonly known as:  DITROPAN Take 1 tablet (5 mg total) by mouth every 8 (eight) hours as needed for bladder spasms.   pantoprazole 40 MG tablet Commonly known as:  PROTONIX Take 40 mg by mouth daily. For GERD.   polyethylene glycol packet Commonly known as:  MIRALAX / GLYCOLAX Take 17 g by mouth daily. What changed:  when to take this  reasons to take this   prochlorperazine 10 MG tablet Commonly known as:  COMPAZINE Take 10 mg by mouth every 6 (six) hours as needed for nausea or vomiting. Reported on 01/10/2016       Allergies  Allergen Reactions  . Codeine Other (See Comments)    Blisters between fingers  . Docetaxel Rash    Consultations:  MedOnc--shadad  Neurology    Procedures/Studies: Ct Head Wo Contrast  Result Date: 02/29/2016 CLINICAL DATA:  Altered mental status. EXAM: CT HEAD WITHOUT CONTRAST TECHNIQUE: Contiguous axial images were obtained from the base of the skull through the vertex without intravenous contrast. COMPARISON:  Yesterday FINDINGS: Brain: Study again degraded by motion, requiring repeat acquisitions. There are 2 areas of low-density that are stable from previous, parasagittal right frontal and left occipital at the atrium of the left lateral ventricle. There is a 1 cm rounded high-density area in the left occipital region which is stable compared to yesterday's postcontrast study. High-density was also subtly seen on precontrast scan from the same day. Superficial, likely subarachnoid  high-density in the anterior interhemispheric fissure. Small bilateral cerebral convexity subdural hygromas without significant mass effect. 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 Chronic small-vessel ischemic change in the cerebral white matter. Vascular: Atherosclerotic calcifications. Skull: Remote left posterior frontal craniotomy. No acute or aggressive process. Sinuses/Orbits: Chronic right mastoid effusion, present since at least 2013. Negative nasopharynx. IMPRESSION: 1. Stable, limited exam. 2. 1 cm left occipital high density area is unchanged from prior, potentially hemorrhage or mineralization rather than enhancement suspected yesterday. Recommend continued close follow-up. 3. Stable small hemorrhage in the anterior interhemispheric fissure without mass effect 4. Parasagittal right frontal and left occipital parenchymal edema which could be cytotoxic or vasogenic. Electronically Signed   By: Monte Fantasia M.D.   On: 02/29/2016 17:32   Ct Head Wo Contrast  Result Date: 02/28/2016 CLINICAL DATA:  Posterior neck pain after fall today. No loss of consciousness. EXAM: CT HEAD WITHOUT CONTRAST CT CERVICAL SPINE WITHOUT CONTRAST TECHNIQUE: Multidetector CT imaging of the head and cervical spine was performed following the standard protocol without intravenous contrast. Multiplanar CT image reconstructions of the cervical spine were also generated. COMPARISON:  CT scan of head of January 12, 2012. FINDINGS: CT HEAD FINDINGS Status post left frontal craniotomy. Otherwise bony calvarium appears intact. Fluid is noted in right mastoid air cells which is unchanged compared to prior exam. Mild diffuse cortical atrophy is noted. Mild chronic ischemic white matter disease is noted.  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 Ventricular size is within normal limits. No midline shift is noted. New focal hyperdensity is noted in right anterior  parafalcine region concerning for possible subarachnoid hemorrhage. There is interval development of right frontal subcortical white matter edema concerning for acute infarction or possible neoplasm. MRI is recommended for further evaluation. CT CERVICAL SPINE FINDINGS No fracture or significant spondylolisthesis is noted. Severe degenerative disc disease is noted at C5-6 and C6-7 with anterior osteophyte formation. Degenerative changes seen involving the posterior facet joints bilaterally. Visualized upper lung fields appear normal. IMPRESSION: Mild diffuse cortical atrophy. Mild chronic ischemic white matter disease. Postsurgical changes seen in left frontal region. There is interval development of right frontal subcortical white matter low density with mass effect concerning for edema ; it is uncertain if this is due to acute infarction or neoplasm. MRI is recommended for further evaluation. Probable right parafalcine subarachnoid hemorrhage is noted as well. Severe multilevel degenerative disc disease is noted. No acute abnormality seen in the cervical spine. Critical Value/emergent results were called by telephone at the time of interpretation on 02/28/2016 at 5:25 pm to Dr. Thurnell Garbe, who verbally acknowledged these results. Electronically Signed   By: Marijo Conception, M.D.   On: 02/28/2016 17:26   Ct Head W Contrast  Result Date: 02/28/2016 CLINICAL DATA:  Follow-up for abnormal head CT. Possible tumor. History is features. EXAM: CT HEAD WITH CONTRAST TECHNIQUE: Contiguous axial images were obtained from the base of the skull through the vertex with intravenous contrast. CONTRAST:  <Contrast> COMPARISON:  Prior CT from earlier same day. FINDINGS: Study moderately degraded by motion artifact. Previously noted area of vasogenic edema within the anterior medial right frontal lobe again seen. Adjacent hyperdensity along the right parafalcine falx, previously questioned to be acute subarachnoid hemorrhage, again  seen, although better evaluated on prior study. No significant enhancement appreciated within this region, however, evaluation fairly limited due to extensive motion artifact. An underlying mass is suspected. There is an additional area of vasogenic edema within the left occipital lobe. Within this area, there is a round enhancing mass measuring 9 mm (series 2, image 12). Given the presence of 2 lesions, findings are concerning for possible metastatic disease. No other definite mass is identified on this motion degraded study. Encephalomalacia involving the anterior inferior left frontal lobe again noted. Atrophy with chronic microvascular ischemic changes are stable. No other acute intracranial process since prior study. No midline shift or hydrocephalus. Scalp soft tissues within normal limits. No acute abnormality about the globes and orbits. Paranasal sinuses and mastoids are stable. Chronic right mastoid effusion noted. Calvarium unchanged. IMPRESSION: 1. 9 mm enhancing mass at the left occipital lobe with surrounding vasogenic edema. 2. Stable vasogenic edema within the anteromedial right frontal lobe with adjacent right parafalcine hyperdensity. No significant enhancement within this region, although evaluation limited by motion artifact on this exam. A second underlying mass is suspected. Given the presence of 2 lesions, findings are highly suspicious for intracranial metastasis. Electronically Signed   By: Jeannine Boga M.D.   On: 02/28/2016 23:26   Ct Soft Tissue Neck W Contrast  Result Date: 03/01/2016 CLINICAL DATA:  History of bladder cancer. Altered mental status. Suspected brain metastasis of unknown origin. EXAM: CT NECK WITH CONTRAST TECHNIQUE: Multidetector CT imaging of the neck was performed using the standard protocol following the bolus administration of intravenous contrast. CONTRAST:  175mL ISOVUE-300 IOPAMIDOL (ISOVUE-300) INJECTION (administered for both neck and body imaging.  COMPARISON:  None. FINDINGS: Pharynx and larynx: No suspicious asymmetry or enhancement. Salivary glands: Parotidectomy on the right. Flat scar-like opacity in the resection bed without concerning nodule. Abnormal right submandibular gland, favor fatty atrophy over excision. Thyroid: Negative Lymph nodes: None enlarged or abnormal density. Vascular: Atheromatous changes with patent major vessels. Porta catheter into the right internal jugular vein. Limited intracranial: Known 10 to 11 mm high-density masslike area in the left occipital lobe is unchanged compared to previous. Visualized orbits: Negative where seen Mastoids and visualized paranasal sinuses: Chronic right mastoiditis and middle ear opacification. Question if this is related to previous radiotherapy. Skeleton: Degenerative changes.  No acute or aggressive process. Upper chest: Reported separately IMPRESSION: 1. No evidence of malignancy in the neck. 2. Right parotidectomy. 3. Chronic right otomastoiditis. Electronically Signed   By: Monte Fantasia M.D.   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 Additional history of urothelial carcinoma treated with chemotherapy, most recently in June 2017. EXAM: CT CHEST, ABDOMEN, AND PELVIS WITH CONTRAST TECHNIQUE: Multidetector CT imaging of the chest, abdomen and pelvis was performed following the standard protocol during bolus administration of intravenous contrast. CONTRAST:  125mL ISOVUE-300 IOPAMIDOL (ISOVUE-300) INJECTION 61% COMPARISON:  12/1915 CT chest, abdomen and pelvis. FINDINGS: Motion degraded scan. CT CHEST FINDINGS Cardiovascular: Stable top-normal heart size. No significant pericardial fluid/thickening. Right internal jugular MediPort terminates at the  cavoatrial junction. Stable configuration of 3 lead left subclavian pacemaker with lead tips in the right atrium and right ventricle. Left anterior descending and left circumflex coronary atherosclerosis. Atherosclerotic nonaneurysmal thoracic aorta. Stable dilated main pulmonary artery (3.7 cm diameter). No central pulmonary emboli. Mediastinum/Nodes: No discrete thyroid nodules. Unremarkable esophagus. No pathologically enlarged axillary, mediastinal or hilar lymph nodes. Lungs/Pleura: No pneumothorax. Trace layering bilateral pleural effusions. There are stable scattered calcified subcentimeter granulomas in both lungs. There is mild compressive atelectasis in the dependent bilateral lower lobes. No acute consolidative airspace disease, lung masses or new significant pulmonary nodules in the aerated portions of the lungs. Musculoskeletal: No aggressive appearing focal osseous lesions. Moderate thoracic spondylosis. Healed deformities in the left posterior ribs. CT ABDOMEN PELVIS FINDINGS Hepatobiliary: Normal liver with no liver mass. Gallbladder is filled with calcified gas-filled gallstones, with no gallbladder wall thickening or pericholecystic fluid. No biliary ductal dilatation. Pancreas: Normal, with no mass or duct dilation. Spleen: Normal size. No mass. Adrenals/Urinary Tract: Normal adrenals. There is new mild right hydroureteronephrosis to the level of the proximal right ureter. No stone or gross obstructing mass is seen in the right urinary tract. No left hydronephrosis. Simple 1.2 cm posterior interpolar left renal cyst. No additional renal lesions. Collapsed bladder with stable chronic diffuse bladder wall thickening. Stomach/Bowel: Grossly normal stomach. Normal caliber small bowel with no small bowel wall thickening. Normal appendix. Diffuse colonic diverticulosis, with no large bowel wall thickening or pericolonic fat stranding. Vascular/Lymphatic: Atherosclerotic nonaneurysmal abdominal aorta.  Patent portal, splenic, hepatic and renal veins. New mildly enlarged 1.0 cm aortocaval node (series 3/ image 63). No additional pathologically enlarged abdominopelvic nodes. Reproductive: Normal size prostate. Other: No pneumoperitoneum, ascites or focal fluid collection. Moderate fat containing right inguinal hernia, stable. Musculoskeletal: No aggressive appearing focal osseous lesions. Moderate lumbar spondylosis. IMPRESSION: 1. Limited motion degraded scan. 2. New mild right hydroureteronephrosis to the level of the proximal right  ureter. No stones or obvious obstructing mass is seen in this location. A urothelial neoplasm cannot be excluded in the right proximal ureter. Urology consultation advised. Consider correlation with retrograde pyelogram and/or short-term follow-up outpatient hematuria protocol CT abdomen/pelvis without and with IV contrast when the patient is able to remain still. 3. New mildly enlarged aortocaval retroperitoneal node, nonspecific, nodal metastasis not excluded. 4. No evidence of metastatic disease in the chest. 5. Additional findings include trace layering bilateral pleural effusions, aortic atherosclerosis, coronary atherosclerosis, stable dilated main pulmonary artery suggesting chronic pulmonary hypertension, cholelithiasis, diffuse colonic diverticulosis, stable chronic mild diffuse bladder wall thickening and moderate fat containing right inguinal hernia. Electronically Signed   By: Ilona Sorrel M.D.   On: 03/01/2016 18:31   Ct Cervical Spine Wo Contrast  Result Date: 02/28/2016 CLINICAL DATA:  Posterior neck pain after fall today. No loss of consciousness. EXAM: CT HEAD WITHOUT CONTRAST CT CERVICAL SPINE WITHOUT CONTRAST TECHNIQUE: Multidetector CT imaging of the head and cervical spine was performed following the standard protocol without intravenous contrast. Multiplanar CT image reconstructions of the cervical spine were also generated. COMPARISON:  CT scan of head of January 12, 2012. FINDINGS: CT HEAD FINDINGS Status post left frontal craniotomy. Otherwise bony calvarium appears intact. Fluid is noted in right mastoid air cells which is unchanged compared to prior exam. Mild diffuse cortical atrophy is noted. Mild chronic ischemic white matter disease is noted. 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 Ventricular size is within normal limits. No midline shift is noted. New focal hyperdensity is noted in right anterior parafalcine region concerning for possible subarachnoid hemorrhage. There is interval development of right frontal subcortical white matter edema concerning for acute infarction or possible neoplasm. MRI is recommended for further evaluation. CT CERVICAL SPINE FINDINGS No fracture or significant spondylolisthesis is noted. Severe degenerative disc disease is noted at C5-6 and C6-7 with anterior osteophyte formation. Degenerative changes seen involving the posterior facet joints bilaterally. Visualized upper lung fields appear normal. IMPRESSION: Mild diffuse cortical atrophy. Mild chronic ischemic white matter disease. Postsurgical changes seen in left frontal region. There is interval development of right frontal subcortical white matter low density with mass effect concerning for edema ; it is uncertain if this is due to acute infarction or neoplasm. MRI is recommended for further evaluation. Probable right parafalcine subarachnoid hemorrhage is noted as well. Severe multilevel degenerative disc disease is noted. No acute abnormality seen in the cervical spine. Critical Value/emergent results were called by telephone at the time of interpretation on 02/28/2016 at 5:25 pm to Dr. Thurnell Garbe, who verbally acknowledged these results. Electronically Signed   By: Marijo Conception, M.D.   On: 02/28/2016 17:26   Ct Abdomen Pelvis W Contrast  Result Date: 03/01/2016 CLINICAL DATA:  82 year old male inpatient with reported history of new brain  metastasis of uncertain origin. 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 Additional history of urothelial carcinoma treated with chemotherapy, most recently in June 2017. EXAM: CT CHEST, ABDOMEN, AND PELVIS WITH CONTRAST TECHNIQUE: Multidetector CT imaging of the chest, abdomen and pelvis was performed following the standard protocol during bolus administration of intravenous contrast. CONTRAST:  168mL ISOVUE-300 IOPAMIDOL (ISOVUE-300) INJECTION 61% COMPARISON:  12/1915 CT chest, abdomen and pelvis. FINDINGS: Motion degraded scan. CT CHEST FINDINGS Cardiovascular: Stable top-normal heart size. No significant pericardial fluid/thickening. Right internal jugular MediPort terminates at the cavoatrial junction. Stable configuration of 3 lead left subclavian pacemaker with lead  tips in the right atrium and right ventricle. Left anterior descending and left circumflex coronary atherosclerosis. Atherosclerotic nonaneurysmal thoracic aorta. Stable dilated main pulmonary artery (3.7 cm diameter). No central pulmonary emboli. Mediastinum/Nodes: No discrete thyroid nodules. Unremarkable esophagus. No pathologically enlarged axillary, mediastinal or hilar lymph nodes. Lungs/Pleura: No pneumothorax. Trace layering bilateral pleural effusions. There are stable scattered calcified subcentimeter granulomas in both lungs. There is mild compressive atelectasis in the dependent bilateral lower lobes. No acute consolidative airspace disease, lung masses or new significant pulmonary nodules in the aerated portions of the lungs. Musculoskeletal: No aggressive appearing focal osseous lesions. Moderate thoracic spondylosis. Healed deformities in the left posterior ribs. CT ABDOMEN PELVIS FINDINGS Hepatobiliary: Normal liver with no liver mass. Gallbladder is filled with calcified gas-filled gallstones, with no gallbladder wall thickening or pericholecystic fluid. No biliary  ductal dilatation. Pancreas: Normal, with no mass or duct dilation. Spleen: Normal size. No mass. Adrenals/Urinary Tract: Normal adrenals. There is new mild right hydroureteronephrosis to the level of the proximal right ureter. No stone or gross obstructing mass is seen in the right urinary tract. No left hydronephrosis. Simple 1.2 cm posterior interpolar left renal cyst. No additional renal lesions. Collapsed bladder with stable chronic diffuse bladder wall thickening. Stomach/Bowel: Grossly normal stomach. Normal caliber small bowel with no small bowel wall thickening. Normal appendix. Diffuse colonic diverticulosis, with no large bowel wall thickening or pericolonic fat stranding. Vascular/Lymphatic: Atherosclerotic nonaneurysmal abdominal aorta. Patent portal, splenic, hepatic and renal veins. New mildly enlarged 1.0 cm aortocaval node (series 3/ image 63). No additional pathologically enlarged abdominopelvic nodes. Reproductive: Normal size prostate. Other: No pneumoperitoneum, ascites or focal fluid collection. Moderate fat containing right inguinal hernia, stable. Musculoskeletal: No aggressive appearing focal osseous lesions. Moderate lumbar spondylosis. IMPRESSION: 1. Limited motion degraded scan. 2. New mild right hydroureteronephrosis to the level of the proximal right ureter. No stones or obvious obstructing mass is seen in this location. A urothelial neoplasm cannot be excluded in the right proximal ureter. Urology consultation advised. Consider correlation with retrograde pyelogram and/or short-term follow-up outpatient hematuria protocol CT abdomen/pelvis without and with IV contrast when the patient is able to remain still. 3. New mildly enlarged aortocaval retroperitoneal node, nonspecific, nodal metastasis not excluded. 4. No evidence of metastatic disease in the chest. 5. Additional findings include trace layering bilateral pleural effusions, aortic atherosclerosis, coronary atherosclerosis,  stable dilated main pulmonary artery suggesting chronic pulmonary hypertension, cholelithiasis, diffuse colonic diverticulosis, stable chronic mild diffuse bladder wall thickening and moderate fat containing right inguinal hernia. Electronically Signed   By: Ilona Sorrel M.D.   On: 03/01/2016 18:31   Dg Pelvis Portable  Result Date: 02/28/2016 CLINICAL DATA:  Fall.  Dysphasia. EXAM: PORTABLE PELVIS 1-2 VIEWS COMPARISON:  01/08/2016 FINDINGS: Both hips appear located. There is no displaced fracture identified. Mild degenerative changes are noted involving both hips. IMPRESSION: 1. No acute findings. 2. If there is high clinical suspicion for occult fracture or the patient refuses to weightbear, consider further evaluation with MRI. Although CT is expeditious, evidence is lacking regarding accuracy of CT over plain film radiography. Electronically Signed   By: Kerby Moors M.D.   On: 02/28/2016 15:16   Dg Chest Portable 1 View  Result Date: 02/28/2016 CLINICAL DATA:  Aphasia. EXAM: PORTABLE CHEST 1 VIEW COMPARISON:  Radiographs of August 27, 2015. FINDINGS: The heart size and mediastinal contours are within normal limits. Both lungs are clear. Aortic atherosclerosis is noted. Right-sided Port-A-Cath and left-sided pacemaker are unchanged in position. No pneumothorax or  pleural effusion is noted. The visualized skeletal structures are unremarkable. IMPRESSION: No acute cardiopulmonary abnormality seen.  Aortic atherosclerosis. Electronically Signed   By: Marijo Conception, M.D.   On: 02/28/2016 15:16        Discharge Exam: Vitals:   03/03/16 2113 03/04/16 0638  BP: (!) 158/68 (!) 158/64  Pulse: (!) 58 60  Resp: 18 18  Temp: 97.8 F (36.6 C) 97.8 F (36.6 C)   Vitals:   03/03/16 0658 03/03/16 1407 03/03/16 2113 03/04/16 0638  BP: (!) 190/85 (!) 155/73 (!) 158/68 (!) 158/64  Pulse: 65 61 (!) 58 60  Resp: 18 17 18 18   Temp: 97.7 F (36.5 C) 98.2 F (36.8 C) 97.8 F (36.6 C) 97.8 F (36.6 C)    TempSrc:  Oral Oral Oral  SpO2: (!) 69% 99% 98% 98%  Weight:      Height:        General: Pt is alert, awake, not in acute distress Cardiovascular: RRR, S1/S2 +, no rubs, no gallops Respiratory: CTA bilaterally, no wheezing, no rhonchi Abdominal: Soft, NT, ND, bowel sounds + Extremities: no edema, no cyanosis   The results of significant diagnostics from this hospitalization (including imaging, microbiology, ancillary and laboratory) are listed below for reference.    Significant Diagnostic Studies: Ct Head Wo Contrast  Result Date: 02/29/2016 CLINICAL DATA:  Altered mental status. EXAM: CT HEAD WITHOUT CONTRAST TECHNIQUE: Contiguous axial images were obtained from the base of the skull through the vertex without intravenous contrast. COMPARISON:  Yesterday FINDINGS: Brain: Study again degraded by motion, requiring repeat acquisitions. There are 2 areas of low-density that are stable from previous, parasagittal right frontal and left occipital at the atrium of the left lateral ventricle. There is a 1 cm rounded high-density area in the left occipital region which is stable compared to yesterday's postcontrast study. High-density was also subtly seen on precontrast scan from the same day. Superficial, likely subarachnoid high-density in the anterior interhemispheric fissure. Small bilateral cerebral convexity subdural hygromas without significant mass effect. 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 Chronic small-vessel ischemic change in the cerebral white matter. Vascular: Atherosclerotic calcifications. Skull: Remote left posterior frontal craniotomy. No acute or aggressive process. Sinuses/Orbits: Chronic right mastoid effusion, present since at least 2013. Negative nasopharynx. IMPRESSION: 1. Stable, limited exam. 2. 1 cm left occipital high density area is unchanged from prior, potentially hemorrhage or mineralization rather than enhancement  suspected yesterday. Recommend continued close follow-up. 3. Stable small hemorrhage in the anterior interhemispheric fissure without mass effect 4. Parasagittal right frontal and left occipital parenchymal edema which could be cytotoxic or vasogenic. Electronically Signed   By: Monte Fantasia M.D.   On: 02/29/2016 17:32   Ct Head Wo Contrast  Result Date: 02/28/2016 CLINICAL DATA:  Posterior neck pain after fall today. No loss of consciousness. EXAM: CT HEAD WITHOUT CONTRAST CT CERVICAL SPINE WITHOUT CONTRAST TECHNIQUE: Multidetector CT imaging of the head and cervical spine was performed following the standard protocol without intravenous contrast. Multiplanar CT image reconstructions of the cervical spine were also generated. COMPARISON:  CT scan of head of January 12, 2012. FINDINGS: CT HEAD FINDINGS Status post left frontal craniotomy. Otherwise bony calvarium appears intact. Fluid is noted in right mastoid air cells which is unchanged compared to prior exam. Mild diffuse cortical atrophy is noted. Mild chronic ischemic white matter disease is noted. 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 Ventricular size is within normal limits.  No midline shift is noted. New focal hyperdensity is noted in right anterior parafalcine region concerning for possible subarachnoid hemorrhage. There is interval development of right frontal subcortical white matter edema concerning for acute infarction or possible neoplasm. MRI is recommended for further evaluation. CT CERVICAL SPINE FINDINGS No fracture or significant spondylolisthesis is noted. Severe degenerative disc disease is noted at C5-6 and C6-7 with anterior osteophyte formation. Degenerative changes seen involving the posterior facet joints bilaterally. Visualized upper lung fields appear normal. IMPRESSION: Mild diffuse cortical atrophy. Mild chronic ischemic white matter disease. Postsurgical changes seen in left frontal region. There is  interval development of right frontal subcortical white matter low density with mass effect concerning for edema ; it is uncertain if this is due to acute infarction or neoplasm. MRI is recommended for further evaluation. Probable right parafalcine subarachnoid hemorrhage is noted as well. Severe multilevel degenerative disc disease is noted. No acute abnormality seen in the cervical spine. Critical Value/emergent results were called by telephone at the time of interpretation on 02/28/2016 at 5:25 pm to Dr. Thurnell Garbe, who verbally acknowledged these results. Electronically Signed   By: Marijo Conception, M.D.   On: 02/28/2016 17:26   Ct Head W Contrast  Result Date: 02/28/2016 CLINICAL DATA:  Follow-up for abnormal head CT. Possible tumor. History is features. EXAM: CT HEAD WITH CONTRAST TECHNIQUE: Contiguous axial images were obtained from the base of the skull through the vertex with intravenous contrast. CONTRAST:  <Contrast> COMPARISON:  Prior CT from earlier same day. FINDINGS: Study moderately degraded by motion artifact. Previously noted area of vasogenic edema within the anterior medial right frontal lobe again seen. Adjacent hyperdensity along the right parafalcine falx, previously questioned to be acute subarachnoid hemorrhage, again seen, although better evaluated on prior study. No significant enhancement appreciated within this region, however, evaluation fairly limited due to extensive motion artifact. An underlying mass is suspected. There is an additional area of vasogenic edema within the left occipital lobe. Within this area, there is a round enhancing mass measuring 9 mm (series 2, image 12). Given the presence of 2 lesions, findings are concerning for possible metastatic disease. No other definite mass is identified on this motion degraded study. Encephalomalacia involving the anterior inferior left frontal lobe again noted. Atrophy with chronic microvascular ischemic changes are stable. No other  acute intracranial process since prior study. No midline shift or hydrocephalus. Scalp soft tissues within normal limits. No acute abnormality about the globes and orbits. Paranasal sinuses and mastoids are stable. Chronic right mastoid effusion noted. Calvarium unchanged. IMPRESSION: 1. 9 mm enhancing mass at the left occipital lobe with surrounding vasogenic edema. 2. Stable vasogenic edema within the anteromedial right frontal lobe with adjacent right parafalcine hyperdensity. No significant enhancement within this region, although evaluation limited by motion artifact on this exam. A second underlying mass is suspected. Given the presence of 2 lesions, findings are highly suspicious for intracranial metastasis. Electronically Signed   By: Jeannine Boga M.D.   On: 02/28/2016 23:26   Ct Soft Tissue Neck W Contrast  Result Date: 03/01/2016 CLINICAL DATA:  History of bladder cancer. Altered mental status. Suspected brain metastasis of unknown origin. EXAM: CT NECK WITH CONTRAST TECHNIQUE: Multidetector CT imaging of the neck was performed using the standard protocol following the bolus administration of intravenous contrast. CONTRAST:  148mL ISOVUE-300 IOPAMIDOL (ISOVUE-300) INJECTION (administered for both neck and body imaging. COMPARISON:  None. FINDINGS: Pharynx and larynx: No suspicious asymmetry or enhancement. Salivary glands: Parotidectomy on the  right. Flat scar-like opacity in the resection bed without concerning nodule. Abnormal right submandibular gland, favor fatty atrophy over excision. Thyroid: Negative Lymph nodes: None enlarged or abnormal density. Vascular: Atheromatous changes with patent major vessels. Porta catheter into the right internal jugular vein. Limited intracranial: Known 10 to 11 mm high-density masslike area in the left occipital lobe is unchanged compared to previous. Visualized orbits: Negative where seen Mastoids and visualized paranasal sinuses: Chronic right  mastoiditis and middle ear opacification. Question if this is related to previous radiotherapy. Skeleton: Degenerative changes.  No acute or aggressive process. Upper chest: Reported separately IMPRESSION: 1. No evidence of malignancy in the neck. 2. Right parotidectomy. 3. Chronic right otomastoiditis. Electronically Signed   By: Monte Fantasia M.D.   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 Additional history of urothelial carcinoma treated with chemotherapy, most recently in June 2017. EXAM: CT CHEST, ABDOMEN, AND PELVIS WITH CONTRAST TECHNIQUE: Multidetector CT imaging of the chest, abdomen and pelvis was performed following the standard protocol during bolus administration of intravenous contrast. CONTRAST:  148mL ISOVUE-300 IOPAMIDOL (ISOVUE-300) INJECTION 61% COMPARISON:  12/1915 CT chest, abdomen and pelvis. FINDINGS: Motion degraded scan. CT CHEST FINDINGS Cardiovascular: Stable top-normal heart size. No significant pericardial fluid/thickening. Right internal jugular MediPort terminates at the cavoatrial junction. Stable configuration of 3 lead left subclavian pacemaker with lead tips in the right atrium and right ventricle. Left anterior descending and left circumflex coronary atherosclerosis. Atherosclerotic nonaneurysmal thoracic aorta. Stable dilated main pulmonary artery (3.7 cm diameter). No central pulmonary emboli. Mediastinum/Nodes: No discrete thyroid nodules. Unremarkable esophagus. No pathologically enlarged axillary, mediastinal or hilar lymph nodes. Lungs/Pleura: No pneumothorax. Trace layering bilateral pleural effusions. There are stable scattered calcified subcentimeter granulomas in both lungs. There is mild compressive atelectasis in the  dependent bilateral lower lobes. No acute consolidative airspace disease, lung masses or new significant pulmonary nodules in the aerated portions of the lungs. Musculoskeletal: No aggressive appearing focal osseous lesions. Moderate thoracic spondylosis. Healed deformities in the left posterior ribs. CT ABDOMEN PELVIS FINDINGS Hepatobiliary: Normal liver with no liver mass. Gallbladder is filled with calcified gas-filled gallstones, with no gallbladder wall thickening or pericholecystic fluid. No biliary ductal dilatation. Pancreas: Normal, with no mass or duct dilation. Spleen: Normal size. No mass. Adrenals/Urinary Tract: Normal adrenals. There is new mild right hydroureteronephrosis to the level of the proximal right ureter. No stone or gross obstructing mass is seen in the right urinary tract. No left hydronephrosis. Simple 1.2 cm posterior interpolar left renal cyst. No additional renal lesions. Collapsed bladder with stable chronic diffuse bladder wall thickening. Stomach/Bowel: Grossly normal stomach. Normal caliber small bowel with no small bowel wall thickening. Normal appendix. Diffuse colonic diverticulosis, with no large bowel wall thickening or pericolonic fat stranding. Vascular/Lymphatic: Atherosclerotic nonaneurysmal abdominal aorta. Patent portal, splenic, hepatic and renal veins. New mildly enlarged 1.0 cm aortocaval node (series 3/ image 63). No additional pathologically enlarged abdominopelvic nodes. Reproductive: Normal size prostate. Other: No pneumoperitoneum, ascites or focal fluid collection. Moderate fat containing right inguinal hernia, stable. Musculoskeletal: No aggressive appearing focal osseous lesions. Moderate lumbar spondylosis. IMPRESSION: 1. Limited motion degraded scan. 2. New mild right hydroureteronephrosis to the level of the proximal right ureter. No stones or obvious obstructing mass is seen in this location. A urothelial neoplasm  cannot be excluded in the right proximal  ureter. Urology consultation advised. Consider correlation with retrograde pyelogram and/or short-term follow-up outpatient hematuria protocol CT abdomen/pelvis without and with IV contrast when the patient is able to remain still. 3. New mildly enlarged aortocaval retroperitoneal node, nonspecific, nodal metastasis not excluded. 4. No evidence of metastatic disease in the chest. 5. Additional findings include trace layering bilateral pleural effusions, aortic atherosclerosis, coronary atherosclerosis, stable dilated main pulmonary artery suggesting chronic pulmonary hypertension, cholelithiasis, diffuse colonic diverticulosis, stable chronic mild diffuse bladder wall thickening and moderate fat containing right inguinal hernia. Electronically Signed   By: Ilona Sorrel M.D.   On: 03/01/2016 18:31   Ct Cervical Spine Wo Contrast  Result Date: 02/28/2016 CLINICAL DATA:  Posterior neck pain after fall today. No loss of consciousness. EXAM: CT HEAD WITHOUT CONTRAST CT CERVICAL SPINE WITHOUT CONTRAST TECHNIQUE: Multidetector CT imaging of the head and cervical spine was performed following the standard protocol without intravenous contrast. Multiplanar CT image reconstructions of the cervical spine were also generated. COMPARISON:  CT scan of head of January 12, 2012. FINDINGS: CT HEAD FINDINGS Status post left frontal craniotomy. Otherwise bony calvarium appears intact. Fluid is noted in right mastoid air cells which is unchanged compared to prior exam. Mild diffuse cortical atrophy is noted. Mild chronic ischemic white matter disease is noted. 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 Ventricular size is within normal limits. No midline shift is noted. New focal hyperdensity is noted in right anterior parafalcine region concerning for possible subarachnoid hemorrhage. There is interval development of right frontal subcortical white matter edema concerning for acute infarction or possible  neoplasm. MRI is recommended for further evaluation. CT CERVICAL SPINE FINDINGS No fracture or significant spondylolisthesis is noted. Severe degenerative disc disease is noted at C5-6 and C6-7 with anterior osteophyte formation. Degenerative changes seen involving the posterior facet joints bilaterally. Visualized upper lung fields appear normal. IMPRESSION: Mild diffuse cortical atrophy. Mild chronic ischemic white matter disease. Postsurgical changes seen in left frontal region. There is interval development of right frontal subcortical white matter low density with mass effect concerning for edema ; it is uncertain if this is due to acute infarction or neoplasm. MRI is recommended for further evaluation. Probable right parafalcine subarachnoid hemorrhage is noted as well. Severe multilevel degenerative disc disease is noted. No acute abnormality seen in the cervical spine. Critical Value/emergent results were called by telephone at the time of interpretation on 02/28/2016 at 5:25 pm to Dr. Thurnell Garbe, who verbally acknowledged these results. Electronically Signed   By: Marijo Conception, M.D.   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 Additional history of urothelial carcinoma treated with chemotherapy, most recently in June 2017. EXAM: CT CHEST, ABDOMEN, AND PELVIS WITH CONTRAST TECHNIQUE: Multidetector CT imaging of the chest, abdomen and pelvis was performed following the standard protocol during bolus administration of intravenous contrast. CONTRAST:  179mL ISOVUE-300 IOPAMIDOL (ISOVUE-300) INJECTION 61% COMPARISON:  12/1915 CT chest, abdomen and pelvis. FINDINGS: Motion degraded scan. CT CHEST FINDINGS Cardiovascular: Stable top-normal heart size. No significant  pericardial fluid/thickening. Right internal jugular MediPort terminates at the cavoatrial junction. Stable configuration of 3 lead left subclavian pacemaker with lead tips in the right atrium and right ventricle. Left anterior descending and left circumflex  coronary atherosclerosis. Atherosclerotic nonaneurysmal thoracic aorta. Stable dilated main pulmonary artery (3.7 cm diameter). No central pulmonary emboli. Mediastinum/Nodes: No discrete thyroid nodules. Unremarkable esophagus. No pathologically enlarged axillary, mediastinal or hilar lymph nodes. Lungs/Pleura: No pneumothorax. Trace layering bilateral pleural effusions. There are stable scattered calcified subcentimeter granulomas in both lungs. There is mild compressive atelectasis in the dependent bilateral lower lobes. No acute consolidative airspace disease, lung masses or new significant pulmonary nodules in the aerated portions of the lungs. Musculoskeletal: No aggressive appearing focal osseous lesions. Moderate thoracic spondylosis. Healed deformities in the left posterior ribs. CT ABDOMEN PELVIS FINDINGS Hepatobiliary: Normal liver with no liver mass. Gallbladder is filled with calcified gas-filled gallstones, with no gallbladder wall thickening or pericholecystic fluid. No biliary ductal dilatation. Pancreas: Normal, with no mass or duct dilation. Spleen: Normal size. No mass. Adrenals/Urinary Tract: Normal adrenals. There is new mild right hydroureteronephrosis to the level of the proximal right ureter. No stone or gross obstructing mass is seen in the right urinary tract. No left hydronephrosis. Simple 1.2 cm posterior interpolar left renal cyst. No additional renal lesions. Collapsed bladder with stable chronic diffuse bladder wall thickening. Stomach/Bowel: Grossly normal stomach. Normal caliber small bowel with no small bowel wall thickening. Normal appendix. Diffuse colonic diverticulosis, with no large bowel wall thickening or pericolonic fat  stranding. Vascular/Lymphatic: Atherosclerotic nonaneurysmal abdominal aorta. Patent portal, splenic, hepatic and renal veins. New mildly enlarged 1.0 cm aortocaval node (series 3/ image 63). No additional pathologically enlarged abdominopelvic nodes. Reproductive: Normal size prostate. Other: No pneumoperitoneum, ascites or focal fluid collection. Moderate fat containing right inguinal hernia, stable. Musculoskeletal: No aggressive appearing focal osseous lesions. Moderate lumbar spondylosis. IMPRESSION: 1. Limited motion degraded scan. 2. New mild right hydroureteronephrosis to the level of the proximal right ureter. No stones or obvious obstructing mass is seen in this location. A urothelial neoplasm cannot be excluded in the right proximal ureter. Urology consultation advised. Consider correlation with retrograde pyelogram and/or short-term follow-up outpatient hematuria protocol CT abdomen/pelvis without and with IV contrast when the patient is able to remain still. 3. New mildly enlarged aortocaval retroperitoneal node, nonspecific, nodal metastasis not excluded. 4. No evidence of metastatic disease in the chest. 5. Additional findings include trace layering bilateral pleural effusions, aortic atherosclerosis, coronary atherosclerosis, stable dilated main pulmonary artery suggesting chronic pulmonary hypertension, cholelithiasis, diffuse colonic diverticulosis, stable chronic mild diffuse bladder wall thickening and moderate fat containing right inguinal hernia. Electronically Signed   By: Ilona Sorrel M.D.   On: 03/01/2016 18:31   Dg Pelvis Portable  Result Date: 02/28/2016 CLINICAL DATA:  Fall.  Dysphasia. EXAM: PORTABLE PELVIS 1-2 VIEWS COMPARISON:  01/08/2016 FINDINGS: Both hips appear located. There is no displaced fracture identified. Mild degenerative changes are noted involving both hips. IMPRESSION: 1. No acute findings. 2. If there is high clinical suspicion for occult fracture or the patient  refuses to weightbear, consider further evaluation with MRI. Although CT is expeditious, evidence is lacking regarding accuracy of CT over plain film radiography. Electronically Signed   By: Kerby Moors M.D.   On: 02/28/2016 15:16   Dg Chest Portable 1 View  Result Date: 02/28/2016 CLINICAL DATA:  Aphasia. EXAM: PORTABLE CHEST 1 VIEW COMPARISON:  Radiographs of August 27, 2015. FINDINGS: The heart size and mediastinal contours are within normal limits. Both lungs are clear. Aortic atherosclerosis is noted. Right-sided Port-A-Cath and left-sided pacemaker are unchanged in position. No pneumothorax or pleural effusion is noted. The visualized skeletal structures are unremarkable. IMPRESSION: No acute cardiopulmonary  abnormality seen.  Aortic atherosclerosis. Electronically Signed   By: Marijo Conception, M.D.   On: 02/28/2016 15:16     Microbiology: Recent Results (from the past 240 hour(s))  MRSA PCR Screening     Status: Abnormal   Collection Time: 02/29/16 10:33 AM  Result Value Ref Range Status   MRSA by PCR POSITIVE (A) NEGATIVE Final    Comment:        The GeneXpert MRSA Assay (FDA approved for NASAL specimens only), is one component of a comprehensive MRSA colonization surveillance program. It is not intended to diagnose MRSA infection nor to guide or monitor treatment for MRSA infections. RESULT CALLED TO, READ BACK BY AND VERIFIED WITH: [facility] RN AT 1242 02/29/16 BY A.[name]      Labs: Basic Metabolic Panel:  Recent Labs Lab 02/28/16 1630 02/29/16 0044 02/29/16 0655 03/01/16 0213  NA 134*  --  136 133*  K 3.2*  --  3.3* 4.5  CL 97*  --  99* 104  CO2 29  --  25 24  GLUCOSE 187*  --  114* 215*  BUN 11  --  9 12  CREATININE 1.03  --  0.97 0.96  CALCIUM 8.8*  --  8.1* 8.2*  MG  --  1.2*  --  1.9   Liver Function Tests:  Recent Labs Lab 02/28/16 1630  AST 29  ALT 12*  ALKPHOS 113  BILITOT 1.8*  PROT 7.3  ALBUMIN 3.4*   No results for input(s): LIPASE,  AMYLASE in the last 168 hours. No results for input(s): AMMONIA in the last 168 hours. CBC:  Recent Labs Lab 02/28/16 1630 02/29/16 0655 03/01/16 0213  WBC 9.0 8.2 8.5  NEUTROABS 8.1*  --   --   HGB 13.2 11.7* 12.0*  HCT 37.9* 35.4* 35.2*  MCV 109.5* 110.6* 111.0*  PLT 109* 107* 106*   Cardiac Enzymes:  Recent Labs Lab 02/28/16 1630 02/29/16 0151 02/29/16 0655 02/29/16 1158  TROPONINI 0.09* 0.15* 0.13* 0.09*   BNP: Invalid input(s): POCBNP CBG:  Recent Labs Lab 03/03/16 0756 03/03/16 1133 03/03/16 1708 03/03/16 2112 03/04/16 0811  GLUCAP 152* 200* 230* 185* 192*    Time coordinating discharge:  Greater than 30 minutes  Signed:  Cavin Longman, DO [facility] Pager: [phone] 03/04/2016, 11:03 AM

## 2016-03-04 NOTE — Progress Notes (Signed)
Patient will DC to: [facility] Anticipated DC date: 03/04/16 Family notified: Daughter Transport by: Corey Pookela   Per MD patient ready for DC to SNF. RN, patient, patient's family, and facility notified of DC. Discharge Summary sent to facility. RN given number for report. DC packet on chart. Ambulance transport requested for patient.   CSW signing off.  Cedric Fishman, [place] Social Worker [phone]

## 2016-03-04 NOTE — Progress Notes (Signed)
NURSING PROGRESS NOTE  [name] [id number] Discharge Data: 03/04/2016 12:42 PM Attending Provider: Orson Eva, MD [id number] [name], MD     [name] to be D/C'd [facility] [place] facility per MD order. Report called the [facility] and given to [name], Therapist, sports. Pt will be transferred by stretcher via EMS.   Last Vital Signs:  Blood pressure (!) 158/64, pulse 60, temperature 97.8 F (36.6 C), temperature source Oral, resp. rate 18, height 5\' 9"  (1.753 m), weight 86.7 kg (191 lb 3.2 oz), SpO2 98 %.  Discharge Medication List   Medication List    TAKE these medications   acetaminophen 500 MG tablet Commonly known as:  TYLENOL Take 500 mg by mouth daily as needed for mild pain.   amLODipine 10 MG tablet Commonly known as:  NORVASC Take 1 tablet (10 mg total) by mouth daily. Start taking on:  03/05/2016   clopidogrel 75 MG tablet Commonly known as:  PLAVIX Take 1 tablet (75 mg total) by mouth daily. For Afib/TIA--restart 03/07/16 Start taking on:  03/07/2016 What changed:  additional instructions   dexamethasone 4 MG tablet Commonly known as:  DECADRON Take 1 tablet (4 mg total) by mouth every 12 (twelve) hours. X 6 days, then 4 mg once daily   divalproex 500 MG DR tablet Commonly known as:  DEPAKOTE Take 1 tablet (500 mg total) by mouth every 12 (twelve) hours.   mineral oil-hydrophilic petrolatum ointment Apply topically 2 (two) times daily. What changed:  how much to take  when to take this   oxybutynin 5 MG tablet Commonly known as:  DITROPAN Take 1 tablet (5 mg total) by mouth every 8 (eight) hours as needed for bladder spasms.   pantoprazole 40 MG tablet Commonly known as:  PROTONIX Take 40 mg by mouth daily. For GERD.   polyethylene glycol packet Commonly known as:  MIRALAX / GLYCOLAX Take 17 g by mouth daily. What changed:  when to take this  reasons to take this   prochlorperazine 10 MG tablet Commonly known as:   COMPAZINE Take 10 mg by mouth every 6 (six) hours as needed for nausea or vomiting. Reported on 01/10/2016

## 2016-03-05 ENCOUNTER — Telehealth: Payer: Self-pay | Admitting: Oncology

## 2016-03-05 NOTE — Telephone Encounter (Signed)
Per Marzetta Board, Dr. Hazeline Junker desk nurse, appointment was scheduled under incorrect Md. Appointment rescheduled with correct Md. 03/05/16 los.

## 2016-03-17 ENCOUNTER — Ambulatory Visit (INDEPENDENT_AMBULATORY_CARE_PROVIDER_SITE_OTHER): Payer: Medicare Other | Admitting: Neurology

## 2016-03-17 ENCOUNTER — Encounter: Payer: Self-pay | Admitting: Neurology

## 2016-03-17 VITALS — BP 125/73 | HR 63 | Ht 69.0 in | Wt 181.4 lb

## 2016-03-17 DIAGNOSIS — I2510 Atherosclerotic heart disease of native coronary artery without angina pectoris: Secondary | ICD-10-CM | POA: Diagnosis not present

## 2016-03-17 DIAGNOSIS — Z79899 Other long term (current) drug therapy: Secondary | ICD-10-CM | POA: Diagnosis not present

## 2016-03-17 DIAGNOSIS — R569 Unspecified convulsions: Secondary | ICD-10-CM | POA: Diagnosis not present

## 2016-03-17 NOTE — Patient Instructions (Signed)
Remember to drink plenty of fluid, eat healthy meals and do not skip any meals. Try to eat protein with a every meal and eat a healthy snack such as fruit or nuts in between meals. Try to keep a regular sleep-wake schedule and try to exercise daily, particularly in the form of walking, 20-30 minutes a day, if you can.   As far as your medications are concerned, I would like to suggest: continue current medications  As far as diagnostic testing: Labs  I would like to see you back in 6 weeks, sooner if we need to. Please call us with any interim questions, concerns, problems, updates or refill requests.   Our phone number is [phone]. We also have an after hours call service for urgent matters and there is a physician on-call for urgent questions. For any emergencies you know to call 911 or go to the nearest emergency room

## 2016-03-17 NOTE — Progress Notes (Signed)
[facility]    Provider:  Dr Jaynee Eagles Referring Provider: [name] Primary Care Physician:  [name], MD  CC:  seizures  HPI:  [name] is a 82 y.o. male here as a referral from Dr. 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    PMHx of afib on coumadin, DM, HTN, HLD and bladder cancer, CAD Previous Hx of left frontal stroke. Reviewed records, he p/w LOC, confused upon waking, then a witnessed seizure in the ED, IV Keppra was given,  Due to pacemaker cannot undergo MRI. CT scan of his head showed a low-density right frontal subcortical white matter lesion concerning for edema and a high density lesion in the right frontal parafalcine region and repeat CT of the head w/contrast c/w metastatic lesions in the brain. He continued to be confused and post-ictal. Keppra was started. EEG was normal. Steroids were started. Plavix was held for one week. Steroids were tapered and Keppra changed to Depakote due to delirium.  Daughter here and provides much information. Patient was found unconscious at home and then confused. He was brought to the ED where he had a witnessed seizure. He was admitted and brain mets were discovered. Patient was started on Keppra and Decadron per neurology and then changed to Depakote. He has not had any events since being in the hospital except for tremors that he can control. He has tremors in the left arm, he can stop it from shaking. He is walking, no falls, he went to skilled nursing facility and is now home with wife. He was released yeseterday from [facility]. No side effects from the depakote. He is taking the dexamethasone. Daughter provides most information. She said she feels his memory is more impaired and he is more lethargic. For example, He looked at his mail yesterday and "slung it to the side". No seizure activity, no LOC or alteration of awareness since being placed on Depakote. No FHx of seizures. Not  following up with Oncology until October when imaging will be repeated. No other associated symptoms. He was at his baseline when he had the seizures, no preceding illness or other triggering factors.    Reviewed notes, labs and imaging from outside physicians, which showed:  Reviewed records, he p/w LOC, confused upon waking, then a witnessed seizure in the ED, IV Keppra was given,  Due to pacemaker cannot undergo MRI. CT scan of his head showed a low-density right frontal subcortical white matter lesion concerning for edema and a high density lesion in the right frontal parafalcine region and repeat CT of the head w/contrast c/w metastatic lesions in the brain. He continued to be confused and post-ictal. Keppra was started. EEG was normal. Steroids were started. Plavix was held for one week. Steroids were tapered and Keppra changed to Depakote due to delirium.   Reviewed images of CT of the head 02/29/2016 and agree with the following:   Brain: Study again degraded by motion, requiring repeat acquisitions. There are 2 areas of low-density that are stable from previous, parasagittal right frontal and left occipital at the atrium of the left lateral ventricle. There is a 1 cm rounded high-density area in the left occipital region which is stable compared to yesterday's postcontrast study. High-density was also subtly seen on precontrast scan from the same day. Superficial, likely subarachnoid high-density in the anterior interhemispheric fissure. Small bilateral cerebral convexity subdural hygromas without significant mass effect. Remote infarct affecting the inferior left frontal lobe at the  operculum and insula. Chronic small-vessel ischemic change in the cerebral white matter.   EEG 02/29/2016 was normal. Per neurosurgery the brain lesions are nonsurgical but may be amenable to stereotactic radiosurgery and recommended oncological evaluation. The patient's case was presented at multidisciplinary brain  oncology conference including neuroradiology, 3 neurosurgeons and radiation oncologist. CT will be repeated with and without contrast in 2 months. Keppra was changed to Depakote due to possible delirium. Patient will stay on 4 mg Decadron daily.  Vascular: Atherosclerotic calcifications.  Skull: Remote left posterior frontal craniotomy. No acute or aggressive process.  Sinuses/Orbits: Chronic right mastoid effusion, present since at least 2013. Negative nasopharynx.  IMPRESSION: 1. Stable, limited exam. 2. 1 cm left occipital high density area is unchanged from prior, potentially hemorrhage or mineralization rather than enhancement suspected yesterday. Recommend continued close follow-up. 3. Stable small hemorrhage in the anterior interhemispheric fissure without mass effect 4. Parasagittal right frontal and left occipital parenchymal edema which could be cytotoxic or vasogenic.   Review of Systems: Patient complains of symptoms per HPI as well as the following symptoms: double vision, easy bruising, palpitations, snoring, joint pain, joint swelling, allergies, runny nose,blood in urine, confusion, headache, slurred speech, seizure, passing out, tremor. Pertinent negatives per HPI. All others negative.   Social History   Social History  . Marital status: Widowed    Spouse name: N/A  . Number of children: 2  . Years of education: 12   Occupational History  . retired    Social History Main Topics  . Smoking status: Never Smoker  . Smokeless tobacco: Never Used  . Alcohol use 10.5 oz/week    21 Standard drinks or equivalent per week     Comment: 3 /day( bourbon)  . Drug use: No  . Sexual activity: No   Other Topics Concern  . Not on file   Social History Narrative   Lives at home. Got released from [place] home yesterday (03/17/16)   Caffeine use:     Family History  Problem Relation Age of Onset  . Heart disease Mother   . Cancer Father     Lung cancer   . Emphysema Brother   . Coronary artery disease Son     Past Medical History:  Diagnosis Date  . Allergy   . Arthritis    degenerative-knees  . Atrial fibrillation ([place]) 1990  . Bladder cancer ([place]) 2010  . Cancer ([place]) 05/05/07-06/15/07   parotid gland/66.4 GY  . Cancer ([place])    left eyelid   . Chronic kidney disease    positive cytology  . Coronary artery disease   . Diabetes mellitus without complication ([place])    meds d/c  2-3 year ago  . Dry eyes   . Dyslipidemia   . Dysrhythmia    HX A FIB  . GERD (gastroesophageal reflux disease)   . Heart murmur    2/6 systolic  . History of chemotherapy     Hx carboplatin/45fu  . History of radiation therapy 05/05/07-06/15/07   right parotid through subclavicular region/ mid jugularlymph node chain  . History of radiation therapy 05/05/07-06/15/07   Right parotid/hemineck,supraclavicular  . HOH (hard of hearing)   . Hypertension    labile  . Pacemaker    2003 vent paced , rate 69  . Pneumonia    hx of   . Prostate hypertrophy   . Right groin hernia   . Skin cancer    HX BASAL CELL REMOVED  . Stroke [facility]) 2003  TIA  . Tinnitus    right   . Urinary tract infection    hx of   . Xerostomia    limited    Past Surgical History:  Procedure Laterality Date  . BACK SURGERY    . CARDIAC CATHETERIZATION  09/20/2012   EF 50-55%, moderately calcified aortic valve leaflets, mild-moderate aortic valve stenosis, LA-appendage moderately dilated, moderate regurg of the mitral valve, RA moderately dilated  . CARDIOVASCULAR STRESS TEST  09/05/2009   Pharmalogical stress test w/o chest pain or EKG changes for ischemia  . CRANIOTOMY  12/14/2011   Procedure: CRANIOTOMY HEMATOMA EVACUATION SUBDURAL;  Surgeon: Ophelia Charter, MD;  Location: [facility];  Service: Neurosurgery;  Laterality: Left;  LEFT Craniotomy for subdural   . CYSTOSCOPY  01/2011   neg  . CYSTOSCOPY W/ RETROGRADES  04/01/2012   Procedure: CYSTOSCOPY WITH  RETROGRADE PYELOGRAM;  Surgeon: Claybon Jabs, MD;  [facility];  Service: Urology;  Laterality: Bilateral;  . CYSTOSCOPY WITH BIOPSY  04/01/2012   Procedure: CYSTOSCOPY WITH BIOPSY;  Surgeon: Claybon Jabs, MD;  Location: [facility];  Service: Urology;  Laterality: N/A;  BLADDER BIOPSIES and bladder washings  . CYSTOSCOPY WITH BIOPSY  05/02/2012   Procedure: CYSTOSCOPY WITH BIOPSY;  Surgeon: Claybon Jabs, MD;  Location: [facility];  Service: Urology;  Laterality: N/A;  . CYSTOSCOPY WITH STENT PLACEMENT  05/02/2012   Procedure: CYSTOSCOPY WITH STENT PLACEMENT;  Surgeon: Claybon Jabs, MD;  Location: [facility];  Service: Urology;  Laterality: Bilateral;  . CYSTOSCOPY WITH URETEROSCOPY AND STENT PLACEMENT Right 02/15/2015   Procedure: CYSTOSCOPY WITH RIGHT URETEROSCOPY, RENAL PELVIC WASHINGS, STENT, RETROGRADE;  Surgeon: Kathie Rhodes, MD;  Location: [facility];  Service: Urology;  Laterality: Right;  . CYSTOSCOPY/RETROGRADE/URETEROSCOPY  05/02/2012   Procedure: CYSTOSCOPY/RETROGRADE/URETEROSCOPY;  Surgeon: Claybon Jabs, MD;  Location: [facility];  Service: Urology;  Laterality: Bilateral;  CYSTOSCOPY BILATERAL RETROGRADE PYLOGRAM BILATERAL URETEROSCOPY AND BIOPSY    . CYSTOSCOPY/RETROGRADE/URETEROSCOPY Bilateral 01/21/2015   Procedure: CYSTOSCOPY/RETROGRADE/ BLADDER BIOPSY;  Surgeon: Kathie Rhodes, MD;  Location: [facility];  Service: Urology;  Laterality: Bilateral;  . LOWER EXTREMITY ARTERIAL DOPPLER  09/28/2011   No evidence of arterial insufficiency.   . [place]  . PACEMAKER GENERATOR CHANGE  05/23/2002   [facility] [place], [place], serial [phone]  . PACEMAKER GENERATOR CHANGE N/A 09/01/2013   Procedure: PACEMAKER GENERATOR CHANGE;  Surgeon: Sanda Klein, MD;  Location: [facility];  Service: Cardiovascular;  Laterality: N/A;  . [place]   last changed 2003  .  PAROTIDECTOMY     right  . URETEROSCOPY  04/01/2012   Procedure: URETEROSCOPY;  Surgeon: Claybon Jabs, MD;  Location: [facility];  Service: Urology;  Laterality: Right;    Current Outpatient Prescriptions  Medication Sig Dispense Refill  . acetaminophen (TYLENOL) 500 MG tablet Take 500 mg by mouth daily as needed for mild pain.    Marland Kitchen amLODipine (NORVASC) 10 MG tablet Take 1 tablet (10 mg total) by mouth daily. 30 tablet 0  . clopidogrel (PLAVIX) 75 MG tablet Take 1 tablet (75 mg total) by mouth daily. For Afib/TIA--restart 03/07/16 30 tablet 0  . dexamethasone (DECADRON) 4 MG tablet Take 1 tablet (4 mg total) by mouth every 12 (twelve) hours. X 6 days, then 4 mg once daily 36 tablet 0  . divalproex (DEPAKOTE) 500 MG DR tablet Take 1 tablet (500  mg total) by mouth every 12 (twelve) hours. 60 tablet 0  . insulin aspart (NOVOLOG) 100 UNIT/ML injection Inject 100 Units into the skin. Before meals and at bedtime.  Follow sliding scale instruction.    Marland Kitchen levofloxacin (LEVAQUIN) 500 MG tablet Take 500 mg by mouth daily. Tomorrow last dose - 03/17/16 per daughter [name]    . mineral oil-hydrophilic petrolatum (AQUAPHOR) ointment Apply topically 2 (two) times daily. (Patient taking differently: Apply 1 application topically daily. ) 420 g 0  . oxybutynin (DITROPAN) 5 MG tablet Take 1 tablet (5 mg total) by mouth every 8 (eight) hours as needed for bladder spasms.    . pantoprazole (PROTONIX) 40 MG tablet Take 40 mg by mouth daily. For GERD.    Marland Kitchen polyethylene glycol (MIRALAX / GLYCOLAX) packet Take 17 g by mouth daily. (Patient taking differently: Take 17 g by mouth daily as needed for mild constipation. )    . prochlorperazine (COMPAZINE) 10 MG tablet Take 10 mg by mouth every 6 (six) hours as needed for nausea or vomiting. Reported on 01/10/2016     No current facility-administered medications for this visit.     Allergies as of 03/17/2016 - Review Complete 03/17/2016  Allergen Reaction  Noted  . Codeine Other (See Comments) 05/15/2011  . Docetaxel Rash 05/15/2011    Vitals: BP 125/73 (BP Location: Right Arm, Patient Position: Sitting, Cuff Size: Normal)   Pulse 63   Ht 5\' 9"  (1.753 m)   Wt 181 lb 6.4 oz (82.3 kg)   BMI 26.79 kg/m  Last Weight:  Wt Readings from Last 1 Encounters:  03/17/16 181 lb 6.4 oz (82.3 kg)   Last Height:   Ht Readings from Last 1 Encounters:  03/17/16 5\' 9"  (1.753 m)   Physical exam: Exam: Gen: NAD, not conversant              CV: RRR, +SEM. No Carotid Bruits. +peripheral edema, warm, nontender Eyes: Conjunctivae clear without exudates or hemorrhage  Neuro: Detailed Neurologic Exam  Speech:    Speech is normal; fluent and spontaneous with normal comprehension.  Cognition:    The patient is oriented to person, month, year, not day;     recent memory impaired and remote memory intact;     language fluent;     normal attention, concentration,     fund of knowledge appears intact, knows the president and the first lady and some current affairs Cranial Nerves:    The pupils are assymmetric but round, and reactive to light. Attempted fundoscopic exam could not visualize due to small pupils. Visual fields are full to finger confrontation. Extraocular movements are intact. Trigeminal sensation is intact and the muscles of mastication are normal. Bilateral ptosis r>L. The palate elevates in the midline. Hearing intact. Voice is normal. Shoulder shrug is normal. The tongue has normal motion without fasciculations.   Coordination:  No dysmetria  Gait: can get up with use of hands, wide based,    Imbalanced with a walker.   Motor Observation: distal atrophy most pronounced in the FDI. and no involuntary movements noted. Tone:    Normal muscle tone.    Posture:    Posture is stooped    Strength: Bilat hip flexion weakness otherwise strength is V/V in the upper and lower limbs.      Sensation: intact to LT     Reflex  Exam:  DTR's:    Deep tendon reflexes in the upper normal and lower absent  bilaterally.  Toes:    The toes are downgoing bilaterally.   Clonus:    Clonus is absent.    Assessment/Plan:   82 y.o. male here as a referral from Dr. 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    PMHx of afib on coumadin, DM, HTN, HLD and bladder cancer, CAD Previous Hx of left frontal stroke. Reviewed records, he p/w LOC, confused upon waking, then a witnessed seizure in the ED, IV Keppra was given,  Due to pacemaker cannot undergo MRI. Keppra changed to Depakote.   - EEG 02/29/2016 was normal. Per neurosurgery the brain lesions are nonsurgical but may be amenable to stereotactic radiosurgery and recommended oncological evaluation. The patient's case was presented at multidisciplinary brain oncology conference including neuroradiology, 3 neurosurgeons and radiation oncologist. CT will be repeated with and without contrast in 2 months. Keppra was changed to Depakote due to possible delirium. Patient will stay on 4 mg Decadron daily.  - Will check labs today including Depakote level, unfortunately will not be a trough since was taken today. He is have some memory changes per daughter, they do not want to increase Depakote, will try to evaluate level but may hold on current dose since he doing well, no episodes and ask he not take his depakote before next appointment if he caom ein the morning so we can check a trough level.   - follow up in 6 weeks will watch him very closely they are to call me with any issues or alteration in consciousness  Sarina Ill, MD  [facility] [address] [place], [place] [address]  Phone [phone] Fax [phone]

## 2016-03-18 ENCOUNTER — Telehealth: Payer: Self-pay | Admitting: *Deleted

## 2016-03-18 LAB — CBC
HEMATOCRIT: 44.5 % (ref 37.5–51.0)
Hemoglobin: 15.5 g/dL (ref 12.6–17.7)
MCH: 36.5 pg — AB (ref 26.6–33.0)
MCHC: 34.8 g/dL (ref 31.5–35.7)
MCV: 105 fL — AB (ref 79–97)
PLATELETS: 147 10*3/uL — AB (ref 150–379)
RBC: 4.25 x10E6/uL (ref 4.14–5.80)
RDW: 13.6 % (ref 12.3–15.4)
WBC: 14.6 10*3/uL — ABNORMAL HIGH (ref 3.4–10.8)

## 2016-03-18 LAB — COMPREHENSIVE METABOLIC PANEL
ALBUMIN: 3.7 g/dL (ref 3.5–4.7)
ALT: 14 IU/L (ref 0–44)
AST: 15 IU/L (ref 0–40)
Albumin/Globulin Ratio: 1.4 (ref 1.2–2.2)
Alkaline Phosphatase: 83 IU/L (ref 39–117)
BUN/Creatinine Ratio: 32 — ABNORMAL HIGH (ref 10–24)
BUN: 38 mg/dL — AB (ref 8–27)
Bilirubin Total: 0.9 mg/dL (ref 0.0–1.2)
CALCIUM: 9.4 mg/dL (ref 8.6–10.2)
CO2: 24 mmol/L (ref 18–29)
CREATININE: 1.17 mg/dL (ref 0.76–1.27)
Chloride: 93 mmol/L — ABNORMAL LOW (ref 96–106)
GFR calc Af Amer: 66 mL/min/{1.73_m2} (ref >59)
GFR, EST NON AFRICAN AMERICAN: 57 mL/min/{1.73_m2} — AB (ref >59)
GLOBULIN, TOTAL: 2.7 g/dL (ref 1.5–4.5)
Glucose: 189 mg/dL — ABNORMAL HIGH (ref 65–99)
Potassium: 5.6 mmol/L — ABNORMAL HIGH (ref 3.5–5.2)
SODIUM: 136 mmol/L (ref 134–144)
TOTAL PROTEIN: 6.4 g/dL (ref 6.0–8.5)

## 2016-03-18 LAB — VALPROIC ACID LEVEL: Valproic Acid Lvl: 75 ug/mL (ref 50–100)

## 2016-03-18 LAB — AMMONIA: Ammonia: 47 ug/dL (ref 27–102)

## 2016-03-18 NOTE — Telephone Encounter (Signed)
-----   Message from Melvenia Beam, MD sent at 03/18/2016  3:15 PM EDT ----- Patient's depakote level was within therapeutic levels, will keep him on the current depakote dose. He appears to be dehydrated. Ask him to increase fluid intake. His WBCs are elevated but that is due to the steroids, not concerning.

## 2016-03-18 NOTE — Telephone Encounter (Signed)
Called and spoke to daughter [name] about lab results per Dr Jaynee Eagles note. She verbalized understanding and has no further questions at this time.

## 2016-03-20 ENCOUNTER — Encounter: Payer: Self-pay | Admitting: Neurology

## 2016-03-25 ENCOUNTER — Encounter (HOSPITAL_COMMUNITY): Payer: Self-pay

## 2016-03-25 ENCOUNTER — Observation Stay (HOSPITAL_COMMUNITY): Payer: Medicare Other

## 2016-03-25 ENCOUNTER — Inpatient Hospital Stay (HOSPITAL_COMMUNITY)
Admission: EM | Admit: 2016-03-25 | Discharge: 2016-04-10 | DRG: 252 | Disposition: A | Discharge status: Hospice | Payer: Medicare Other | Attending: Internal Medicine | Admitting: Internal Medicine

## 2016-03-25 DIAGNOSIS — Z515 Encounter for palliative care: Secondary | ICD-10-CM | POA: Diagnosis not present

## 2016-03-25 DIAGNOSIS — G40909 Epilepsy, unspecified, not intractable, without status epilepticus: Secondary | ICD-10-CM | POA: Diagnosis present

## 2016-03-25 DIAGNOSIS — T380X5A Adverse effect of glucocorticoids and synthetic analogues, initial encounter: Secondary | ICD-10-CM | POA: Diagnosis present

## 2016-03-25 DIAGNOSIS — R4182 Altered mental status, unspecified: Secondary | ICD-10-CM

## 2016-03-25 DIAGNOSIS — N1330 Unspecified hydronephrosis: Secondary | ICD-10-CM | POA: Diagnosis present

## 2016-03-25 DIAGNOSIS — I10 Essential (primary) hypertension: Secondary | ICD-10-CM

## 2016-03-25 DIAGNOSIS — E46 Unspecified protein-calorie malnutrition: Secondary | ICD-10-CM | POA: Diagnosis present

## 2016-03-25 DIAGNOSIS — Z79899 Other long term (current) drug therapy: Secondary | ICD-10-CM

## 2016-03-25 DIAGNOSIS — N132 Hydronephrosis with renal and ureteral calculous obstruction: Secondary | ICD-10-CM | POA: Diagnosis not present

## 2016-03-25 DIAGNOSIS — R739 Hyperglycemia, unspecified: Secondary | ICD-10-CM | POA: Diagnosis not present

## 2016-03-25 DIAGNOSIS — K219 Gastro-esophageal reflux disease without esophagitis: Secondary | ICD-10-CM | POA: Diagnosis present

## 2016-03-25 DIAGNOSIS — Z8673 Personal history of transient ischemic attack (TIA), and cerebral infarction without residual deficits: Secondary | ICD-10-CM

## 2016-03-25 DIAGNOSIS — A419 Sepsis, unspecified organism: Secondary | ICD-10-CM

## 2016-03-25 DIAGNOSIS — G9340 Encephalopathy, unspecified: Secondary | ICD-10-CM | POA: Diagnosis present

## 2016-03-25 DIAGNOSIS — I2510 Atherosclerotic heart disease of native coronary artery without angina pectoris: Secondary | ICD-10-CM | POA: Diagnosis present

## 2016-03-25 DIAGNOSIS — T80211A Bloodstream infection due to central venous catheter, initial encounter: Principal | ICD-10-CM | POA: Diagnosis present

## 2016-03-25 DIAGNOSIS — Z885 Allergy status to narcotic agent status: Secondary | ICD-10-CM

## 2016-03-25 DIAGNOSIS — I82623 Acute embolism and thrombosis of deep veins of upper extremity, bilateral: Secondary | ICD-10-CM | POA: Diagnosis present

## 2016-03-25 DIAGNOSIS — Z8551 Personal history of malignant neoplasm of bladder: Secondary | ICD-10-CM

## 2016-03-25 DIAGNOSIS — E860 Dehydration: Secondary | ICD-10-CM | POA: Diagnosis not present

## 2016-03-25 DIAGNOSIS — M170 Bilateral primary osteoarthritis of knee: Secondary | ICD-10-CM | POA: Diagnosis present

## 2016-03-25 DIAGNOSIS — Z7189 Other specified counseling: Secondary | ICD-10-CM

## 2016-03-25 DIAGNOSIS — C679 Malignant neoplasm of bladder, unspecified: Secondary | ICD-10-CM | POA: Diagnosis present

## 2016-03-25 DIAGNOSIS — E1165 Type 2 diabetes mellitus with hyperglycemia: Secondary | ICD-10-CM | POA: Diagnosis present

## 2016-03-25 DIAGNOSIS — C651 Malignant neoplasm of right renal pelvis: Secondary | ICD-10-CM | POA: Diagnosis present

## 2016-03-25 DIAGNOSIS — Z794 Long term (current) use of insulin: Secondary | ICD-10-CM

## 2016-03-25 DIAGNOSIS — R079 Chest pain, unspecified: Secondary | ICD-10-CM

## 2016-03-25 DIAGNOSIS — H9191 Unspecified hearing loss, right ear: Secondary | ICD-10-CM | POA: Diagnosis present

## 2016-03-25 DIAGNOSIS — C661 Malignant neoplasm of right ureter: Secondary | ICD-10-CM | POA: Diagnosis present

## 2016-03-25 DIAGNOSIS — IMO0002 Reserved for concepts with insufficient information to code with codable children: Secondary | ICD-10-CM

## 2016-03-25 DIAGNOSIS — G939 Disorder of brain, unspecified: Secondary | ICD-10-CM | POA: Diagnosis present

## 2016-03-25 DIAGNOSIS — G9341 Metabolic encephalopathy: Secondary | ICD-10-CM | POA: Diagnosis not present

## 2016-03-25 DIAGNOSIS — N179 Acute kidney failure, unspecified: Secondary | ICD-10-CM | POA: Diagnosis present

## 2016-03-25 DIAGNOSIS — R071 Chest pain on breathing: Secondary | ICD-10-CM | POA: Diagnosis not present

## 2016-03-25 DIAGNOSIS — Z6827 Body mass index (BMI) 27.0-27.9, adult: Secondary | ICD-10-CM

## 2016-03-25 DIAGNOSIS — R229 Localized swelling, mass and lump, unspecified: Secondary | ICD-10-CM

## 2016-03-25 DIAGNOSIS — Z9221 Personal history of antineoplastic chemotherapy: Secondary | ICD-10-CM

## 2016-03-25 DIAGNOSIS — E43 Unspecified severe protein-calorie malnutrition: Secondary | ICD-10-CM | POA: Diagnosis present

## 2016-03-25 DIAGNOSIS — R627 Adult failure to thrive: Secondary | ICD-10-CM | POA: Diagnosis present

## 2016-03-25 DIAGNOSIS — F419 Anxiety disorder, unspecified: Secondary | ICD-10-CM | POA: Diagnosis present

## 2016-03-25 DIAGNOSIS — I2782 Chronic pulmonary embolism: Secondary | ICD-10-CM | POA: Diagnosis present

## 2016-03-25 DIAGNOSIS — F1010 Alcohol abuse, uncomplicated: Secondary | ICD-10-CM | POA: Diagnosis present

## 2016-03-25 DIAGNOSIS — R197 Diarrhea, unspecified: Secondary | ICD-10-CM | POA: Diagnosis not present

## 2016-03-25 DIAGNOSIS — C7931 Secondary malignant neoplasm of brain: Secondary | ICD-10-CM | POA: Diagnosis present

## 2016-03-25 DIAGNOSIS — Z95828 Presence of other vascular implants and grafts: Secondary | ICD-10-CM

## 2016-03-25 DIAGNOSIS — E871 Hypo-osmolality and hyponatremia: Secondary | ICD-10-CM | POA: Diagnosis present

## 2016-03-25 DIAGNOSIS — E876 Hypokalemia: Secondary | ICD-10-CM | POA: Diagnosis present

## 2016-03-25 DIAGNOSIS — A4101 Sepsis due to Methicillin susceptible Staphylococcus aureus: Secondary | ICD-10-CM

## 2016-03-25 DIAGNOSIS — R7881 Bacteremia: Secondary | ICD-10-CM | POA: Diagnosis not present

## 2016-03-25 DIAGNOSIS — D65 Disseminated intravascular coagulation [defibrination syndrome]: Secondary | ICD-10-CM

## 2016-03-25 DIAGNOSIS — Z66 Do not resuscitate: Secondary | ICD-10-CM | POA: Diagnosis present

## 2016-03-25 DIAGNOSIS — I2699 Other pulmonary embolism without acute cor pulmonale: Secondary | ICD-10-CM

## 2016-03-25 DIAGNOSIS — C659 Malignant neoplasm of unspecified renal pelvis: Secondary | ICD-10-CM | POA: Diagnosis present

## 2016-03-25 DIAGNOSIS — Z888 Allergy status to other drugs, medicaments and biological substances status: Secondary | ICD-10-CM

## 2016-03-25 DIAGNOSIS — I482 Chronic atrial fibrillation: Secondary | ICD-10-CM | POA: Diagnosis present

## 2016-03-25 DIAGNOSIS — Z950 Presence of cardiac pacemaker: Secondary | ICD-10-CM

## 2016-03-25 DIAGNOSIS — E119 Type 2 diabetes mellitus without complications: Secondary | ICD-10-CM

## 2016-03-25 DIAGNOSIS — K5900 Constipation, unspecified: Secondary | ICD-10-CM | POA: Diagnosis present

## 2016-03-25 DIAGNOSIS — Z7902 Long term (current) use of antithrombotics/antiplatelets: Secondary | ICD-10-CM

## 2016-03-25 DIAGNOSIS — R569 Unspecified convulsions: Secondary | ICD-10-CM

## 2016-03-25 DIAGNOSIS — Z923 Personal history of irradiation: Secondary | ICD-10-CM

## 2016-03-25 DIAGNOSIS — N131 Hydronephrosis with ureteral stricture, not elsewhere classified: Secondary | ICD-10-CM

## 2016-03-25 DIAGNOSIS — E875 Hyperkalemia: Secondary | ICD-10-CM

## 2016-03-25 DIAGNOSIS — A4102 Sepsis due to Methicillin resistant Staphylococcus aureus: Secondary | ICD-10-CM | POA: Diagnosis present

## 2016-03-25 LAB — BASIC METABOLIC PANEL
Anion gap: 14 (ref 5–15)
Anion gap: 11 (ref 5–15)
Anion gap: 10 (ref 5–15)
BUN: 77 mg/dL — ABNORMAL HIGH (ref 6–20)
BUN: 74 mg/dL — ABNORMAL HIGH (ref 6–20)
BUN: 59 mg/dL — ABNORMAL HIGH (ref 6–20)
CHLORIDE: 94 mmol/L — AB (ref 101–111)
CO2: 19 mmol/L — ABNORMAL LOW (ref 22–32)
CO2: 24 mmol/L (ref 22–32)
CO2: 26 mmol/L (ref 22–32)
CREATININE: 1.39 mg/dL — AB (ref 0.61–1.24)
Calcium: 8.8 mg/dL — ABNORMAL LOW (ref 8.9–10.3)
Calcium: 8.9 mg/dL (ref 8.9–10.3)
Calcium: 9 mg/dL (ref 8.9–10.3)
Chloride: 93 mmol/L — ABNORMAL LOW (ref 101–111)
Chloride: 93 mmol/L — ABNORMAL LOW (ref 101–111)
Creatinine, Ser: 1.94 mg/dL — ABNORMAL HIGH (ref 0.61–1.24)
Creatinine, Ser: 1.76 mg/dL — ABNORMAL HIGH (ref 0.61–1.24)
GFR calc Af Amer: 35 mL/min — ABNORMAL LOW (ref >60)
GFR calc Af Amer: 39 mL/min — ABNORMAL LOW (ref >60)
GFR calc non Af Amer: 30 mL/min — ABNORMAL LOW (ref >60)
GFR calc non Af Amer: 34 mL/min — ABNORMAL LOW (ref >60)
GFR, EST AFRICAN AMERICAN: 52 mL/min — AB (ref >60)
GFR, EST NON AFRICAN AMERICAN: 45 mL/min — AB (ref >60)
Glucose, Bld: 546 mg/dL (ref 65–99)
Glucose, Bld: 500 mg/dL — ABNORMAL HIGH (ref 65–99)
Glucose, Bld: 248 mg/dL — ABNORMAL HIGH (ref 65–99)
POTASSIUM: 4.9 mmol/L (ref 3.5–5.1)
Potassium: 6.2 mmol/L — ABNORMAL HIGH (ref 3.5–5.1)
Potassium: 5.6 mmol/L — ABNORMAL HIGH (ref 3.5–5.1)
SODIUM: 130 mmol/L — AB (ref 135–145)
Sodium: 126 mmol/L — ABNORMAL LOW (ref 135–145)
Sodium: 128 mmol/L — ABNORMAL LOW (ref 135–145)

## 2016-03-25 LAB — CBC WITH DIFFERENTIAL/PLATELET
Basophils Absolute: 0 10*3/uL (ref 0.0–0.1)
Basophils Relative: 0 %
Eosinophils Absolute: 0 10*3/uL (ref 0.0–0.7)
Eosinophils Relative: 0 %
HCT: 42.5 % (ref 39.0–52.0)
Hemoglobin: 15.5 g/dL (ref 13.0–17.0)
Lymphocytes Relative: 3 %
Lymphs Abs: 0.4 10*3/uL — ABNORMAL LOW (ref 0.7–4.0)
MCH: 36.9 pg — ABNORMAL HIGH (ref 26.0–34.0)
MCHC: 36.5 g/dL — ABNORMAL HIGH (ref 30.0–36.0)
MCV: 101.2 fL — ABNORMAL HIGH (ref 78.0–100.0)
Monocytes Absolute: 0.4 10*3/uL (ref 0.1–1.0)
Monocytes Relative: 3 %
Neutro Abs: 11.7 10*3/uL — ABNORMAL HIGH (ref 1.7–7.7)
Neutrophils Relative %: 94 %
Platelets: 85 10*3/uL — ABNORMAL LOW (ref 150–400)
RBC: 4.2 MIL/uL — ABNORMAL LOW (ref 4.22–5.81)
RDW: 12.6 % (ref 11.5–15.5)
WBC: 12.5 10*3/uL — ABNORMAL HIGH (ref 4.0–10.5)

## 2016-03-25 LAB — URINALYSIS, ROUTINE W REFLEX MICROSCOPIC
Bilirubin Urine: NEGATIVE
Glucose, UA: >1000 mg/dL — AB
Ketones, ur: NEGATIVE mg/dL
Leukocytes, UA: NEGATIVE
Nitrite: NEGATIVE
Protein, ur: NEGATIVE mg/dL
Specific Gravity, Urine: 1.028 (ref 1.005–1.030)
pH: 5.5 (ref 5.0–8.0)

## 2016-03-25 LAB — GLUCOSE, CAPILLARY: Glucose-Capillary: 244 mg/dL — ABNORMAL HIGH (ref 65–99)

## 2016-03-25 LAB — CBG MONITORING, ED
GLUCOSE-CAPILLARY: 574 mg/dL — AB (ref 65–99)
GLUCOSE-CAPILLARY: 201 mg/dL — AB (ref 65–99)

## 2016-03-25 LAB — VALPROIC ACID LEVEL: VALPROIC ACID LVL: 46 ug/mL — AB (ref 50.0–100.0)

## 2016-03-25 LAB — URINE MICROSCOPIC-ADD ON: Squamous Epithelial / LPF: NONE SEEN

## 2016-03-25 MED ORDER — SODIUM BICARBONATE 8.4 % IV SOLN
50.0000 meq | Freq: Once | INTRAVENOUS | Status: AC
  Administered 2016-03-25: 50 meq via INTRAVENOUS
  Filled 2016-03-25: qty 50

## 2016-03-25 MED ORDER — SODIUM CHLORIDE 0.9 % IV BOLUS (SEPSIS)
500.0000 mL | Freq: Once | INTRAVENOUS | Status: AC
  Administered 2016-03-25: 500 mL via INTRAVENOUS

## 2016-03-25 MED ORDER — DEXAMETHASONE 4 MG PO TABS
4.0000 mg | ORAL_TABLET | Freq: Every day | ORAL | Status: DC
  Administered 2016-03-25 – 2016-04-08 (×13): 4 mg via ORAL
  Filled 2016-03-25 (×16): qty 1

## 2016-03-25 MED ORDER — DIVALPROEX SODIUM 500 MG PO DR TAB
500.0000 mg | DELAYED_RELEASE_TABLET | Freq: Two times a day (BID) | ORAL | Status: DC
  Administered 2016-03-25 – 2016-03-28 (×7): 500 mg via ORAL
  Filled 2016-03-25 (×8): qty 1

## 2016-03-25 MED ORDER — SODIUM CHLORIDE 0.9 % IV SOLN
INTRAVENOUS | Status: DC
  Administered 2016-03-25 – 2016-03-26 (×4): via INTRAVENOUS

## 2016-03-25 MED ORDER — HEPARIN SODIUM (PORCINE) 5000 UNIT/ML IJ SOLN
5000.0000 [IU] | Freq: Three times a day (TID) | INTRAMUSCULAR | Status: DC
  Administered 2016-03-25 – 2016-03-26 (×2): 5000 [IU] via SUBCUTANEOUS
  Filled 2016-03-25: qty 1

## 2016-03-25 MED ORDER — PROCHLORPERAZINE MALEATE 10 MG PO TABS
10.0000 mg | ORAL_TABLET | Freq: Four times a day (QID) | ORAL | Status: DC | PRN
  Filled 2016-03-25: qty 1

## 2016-03-25 MED ORDER — AMLODIPINE BESYLATE 10 MG PO TABS
10.0000 mg | ORAL_TABLET | Freq: Every day | ORAL | Status: DC
  Administered 2016-03-26 – 2016-04-01 (×7): 10 mg via ORAL
  Filled 2016-03-25 (×8): qty 1

## 2016-03-25 MED ORDER — ENSURE ENLIVE PO LIQD
237.0000 mL | Freq: Two times a day (BID) | ORAL | Status: DC
  Administered 2016-03-26 (×2): 237 mL via ORAL

## 2016-03-25 MED ORDER — SODIUM CHLORIDE 0.9 % IV SOLN
1.0000 g | Freq: Once | INTRAVENOUS | Status: AC
  Administered 2016-03-25: 1 g via INTRAVENOUS
  Filled 2016-03-25: qty 10

## 2016-03-25 MED ORDER — CLOPIDOGREL BISULFATE 75 MG PO TABS
75.0000 mg | ORAL_TABLET | Freq: Every day | ORAL | Status: DC
  Administered 2016-03-26 – 2016-03-30 (×5): 75 mg via ORAL
  Filled 2016-03-25 (×5): qty 1

## 2016-03-25 MED ORDER — ONDANSETRON HCL 4 MG PO TABS: 4.0000 mg | ORAL_TABLET | Freq: Four times a day (QID) | ORAL | Status: DC | PRN

## 2016-03-25 MED ORDER — INSULIN ASPART 100 UNIT/ML ~~LOC~~ SOLN
0.0000 [IU] | Freq: Every day | SUBCUTANEOUS | Status: DC
  Administered 2016-03-25: 2 [IU] via SUBCUTANEOUS

## 2016-03-25 MED ORDER — ALPRAZOLAM 0.5 MG PO TABS
0.5000 mg | ORAL_TABLET | Freq: Every evening | ORAL | Status: DC | PRN
  Administered 2016-04-07: 0.5 mg via ORAL
  Filled 2016-03-25: qty 1

## 2016-03-25 MED ORDER — ONDANSETRON HCL 4 MG/2ML IJ SOLN: 4.0000 mg | Freq: Four times a day (QID) | INTRAMUSCULAR | Status: DC | PRN

## 2016-03-25 MED ORDER — SODIUM CHLORIDE 0.9% FLUSH
3.0000 mL | Freq: Two times a day (BID) | INTRAVENOUS | Status: DC
  Administered 2016-03-26 – 2016-04-08 (×23): 3 mL via INTRAVENOUS

## 2016-03-25 MED ORDER — PANTOPRAZOLE SODIUM 40 MG PO TBEC
40.0000 mg | DELAYED_RELEASE_TABLET | Freq: Every day | ORAL | Status: DC
  Administered 2016-03-25 – 2016-04-08 (×14): 40 mg via ORAL
  Filled 2016-03-25 (×17): qty 1

## 2016-03-25 MED ORDER — INSULIN ASPART 100 UNIT/ML ~~LOC~~ SOLN
0.0000 [IU] | Freq: Three times a day (TID) | SUBCUTANEOUS | Status: DC
  Administered 2016-03-25 – 2016-03-26 (×2): 3 [IU] via SUBCUTANEOUS
  Administered 2016-03-26: 7 [IU] via SUBCUTANEOUS
  Filled 2016-03-25: qty 1

## 2016-03-25 MED ORDER — SODIUM CHLORIDE 0.9 % IV BOLUS (SEPSIS)
1000.0000 mL | Freq: Once | INTRAVENOUS | Status: AC
  Administered 2016-03-25: 1000 mL via INTRAVENOUS

## 2016-03-25 MED ORDER — POLYETHYLENE GLYCOL 3350 17 G PO PACK
17.0000 g | PACK | Freq: Every day | ORAL | Status: DC | PRN
  Administered 2016-03-31: 17 g via ORAL
  Filled 2016-03-25: qty 1

## 2016-03-25 MED ORDER — IOPAMIDOL (ISOVUE-300) INJECTION 61%
INTRAVENOUS | Status: AC
  Administered 2016-03-25: 50 mL
  Filled 2016-03-25: qty 50

## 2016-03-25 MED ORDER — OXYCODONE HCL 5 MG PO TABS
5.0000 mg | ORAL_TABLET | ORAL | Status: DC | PRN
Start: 2016-03-25 — End: 2016-03-30
  Filled 2016-03-25: qty 1

## 2016-03-25 MED ORDER — INSULIN ASPART 100 UNIT/ML ~~LOC~~ SOLN
12.0000 [IU] | Freq: Once | SUBCUTANEOUS | Status: AC
  Administered 2016-03-25: 12 [IU] via INTRAVENOUS
  Filled 2016-03-25: qty 1

## 2016-03-25 NOTE — ED Notes (Signed)
Pt CBG 574.

## 2016-03-25 NOTE — ED Provider Notes (Signed)
[facility] DEPT Provider Note   CSN: [id number] Arrival date & time: 03/25/16  1014   By signing my name below, I, [name], attest that this documentation has been prepared under the direction and in the presence of Gabriel Manifold, MD . Electronically Signed: Evelene [name], Scribe. 03/25/2016. 10:58 AM.   History   Chief Complaint Chief Complaint  Patient presents with  . Weakness    The history is provided by the patient. No language interpreter was used.     HPI Comments:  [name] is a 82 y.o. male with a history of DM who presents to the Emergency Department via EMS complaining of weakness. He states he was in the bathroom when he became very weak, his legs gave out, and he fell; however, per nursing note he was caught by his daughter and lowered to the ground, no LOC or head injury. Pt states he has been weak for ~ 1-3 weeks. He denies CP, SOB, and acute swelling in his lower extremities. Pt was recently placed on steroids during last hospitalization ~ 1 month ago.    Past Medical History:  Diagnosis Date  . Allergy   . Arthritis    degenerative-knees  . Atrial fibrillation ([place]) 1990  . Bladder cancer ([place]) 2010  . Cancer ([place]) 05/05/07-06/15/07   parotid gland/66.4 GY  . Cancer ([place])    left eyelid   . Chronic kidney disease    positive cytology  . Coronary artery disease   . Diabetes mellitus without complication ([place])    meds d/c  2-3 year ago  . Dry eyes   . Dyslipidemia   . Dysrhythmia    HX A FIB  . GERD (gastroesophageal reflux disease)   . Heart murmur    2/6 systolic  . History of chemotherapy     Hx carboplatin/67fu  . History of radiation therapy 05/05/07-06/15/07   right parotid through subclavicular region/ mid jugularlymph node chain  . History of radiation therapy 05/05/07-06/15/07   Right parotid/hemineck,supraclavicular  . HOH (hard of hearing)   . Hypertension    labile  . Pacemaker    2003 vent paced , rate 69  .  Pneumonia    hx of   . Prostate hypertrophy   . Right groin hernia   . Skin cancer    HX BASAL CELL REMOVED  . Stroke [facility]) 2003   TIA  . Tinnitus    right   . Urinary tract infection    hx of   . Xerostomia    limited    Patient Active Problem List   Diagnosis Date Noted  . Elevated troponin   . Seizure ([place])   . Abnormal brain CT 03/03/2016  . Hypokalemia   . Seizures ([place]) 02/29/2016  . Syncope 02/28/2016  . Port catheter in place 02/21/2016  . Neutropenic fever ([place])   . Malnutrition of moderate degree 08/28/2015  . Diet-controlled diabetes mellitus ([place])   . Coronary artery disease 08/27/2015  . Thrombocytopenia ([place]) 08/27/2015  . Hematuria 08/27/2015  . Pancytopenia ([place]) 08/27/2015  . Sepsis ([place])   . 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 Chest pain 07/17/2014  . Aortic stenosis 04/29/2013  . Thrombocytosis ([place]) 03/30/2013  . Abnormal urine cytology 05/02/2012  . Acute pulmonary edema ([place]) 10/04/2011  . Alcohol withdrawal delirium ([place]) 10/04/2011  . TIA (transient ischemic attack) 10/03/2011  . Subdural hematoma ([place]) 10/02/2011  . ETOH abuse 10/02/2011  . Permanent atrial fibrillation ([place]) 10/02/2011  .  UTI (lower urinary tract infection) 10/02/2011  . Elevated PSA 05/19/2011  . Pacemaker 05/19/2011  . Diabetes mellitus ([place]) 05/19/2011  . Hypertension 05/19/2011  . Dyslipidemia 05/19/2011    Past Surgical History:  Procedure Laterality Date  . BACK SURGERY    . CARDIAC CATHETERIZATION  09/20/2012   EF 50-55%, moderately calcified aortic valve leaflets, mild-moderate aortic valve stenosis, LA-appendage moderately dilated, moderate regurg of the mitral valve, RA moderately dilated  . CARDIOVASCULAR STRESS TEST  09/05/2009   Pharmalogical stress test w/o chest pain or EKG changes for ischemia  . CRANIOTOMY  12/14/2011   Procedure: CRANIOTOMY HEMATOMA EVACUATION SUBDURAL;  Surgeon: Ophelia Charter, MD;  Location: [facility];  Service:  Neurosurgery;  Laterality: Left;  LEFT Craniotomy for subdural   . CYSTOSCOPY  01/2011   neg  . CYSTOSCOPY W/ RETROGRADES  04/01/2012   Procedure: CYSTOSCOPY WITH RETROGRADE PYELOGRAM;  Surgeon: Claybon Jabs, MD;  Location: [facility];  Service: Urology;  Laterality: Bilateral;  . CYSTOSCOPY WITH BIOPSY  04/01/2012   Procedure: CYSTOSCOPY WITH BIOPSY;  Surgeon: Claybon Jabs, MD;  Location: [facility];  Service: Urology;  Laterality: N/A;  BLADDER BIOPSIES and bladder washings  . CYSTOSCOPY WITH BIOPSY  05/02/2012   Procedure: CYSTOSCOPY WITH BIOPSY;  Surgeon: Claybon Jabs, MD;  Location: [facility];  Service: Urology;  Laterality: N/A;  . CYSTOSCOPY WITH STENT PLACEMENT  05/02/2012   Procedure: CYSTOSCOPY WITH STENT PLACEMENT;  Surgeon: Claybon Jabs, MD;  Location: [facility];  Service: Urology;  Laterality: Bilateral;  . CYSTOSCOPY WITH URETEROSCOPY AND STENT PLACEMENT Right 02/15/2015   Procedure: CYSTOSCOPY WITH RIGHT URETEROSCOPY, RENAL PELVIC WASHINGS, STENT, RETROGRADE;  Surgeon: Kathie Rhodes, MD;  Location: [facility];  Service: Urology;  Laterality: Right;  . CYSTOSCOPY/RETROGRADE/URETEROSCOPY  05/02/2012   Procedure: CYSTOSCOPY/RETROGRADE/URETEROSCOPY;  Surgeon: Claybon Jabs, MD;  Location: [facility];  Service: Urology;  Laterality: Bilateral;  CYSTOSCOPY BILATERAL RETROGRADE PYLOGRAM BILATERAL URETEROSCOPY AND BIOPSY    . CYSTOSCOPY/RETROGRADE/URETEROSCOPY Bilateral 01/21/2015   Procedure: CYSTOSCOPY/RETROGRADE/ BLADDER BIOPSY;  Surgeon: Kathie Rhodes, MD;  Location: [facility];  Service: Urology;  Laterality: Bilateral;  . LOWER EXTREMITY ARTERIAL DOPPLER  09/28/2011   No evidence of arterial insufficiency.   . [place]  . PACEMAKER GENERATOR CHANGE  05/23/2002   [facility], [place], serial [phone]  . PACEMAKER GENERATOR CHANGE N/A 09/01/2013   Procedure: PACEMAKER  GENERATOR CHANGE;  Surgeon: Sanda Klein, MD;  Location: [facility];  Service: Cardiovascular;  Laterality: N/A;  . [place]   last changed 2003  . PAROTIDECTOMY     right  . URETEROSCOPY  04/01/2012   Procedure: URETEROSCOPY;  Surgeon: Claybon Jabs, MD;  Location: [facility];  Service: Urology;  Laterality: Right;       Home Medications    Prior to Admission medications   Medication Sig Start Date End Date Taking? Authorizing Provider  acetaminophen (TYLENOL) 500 MG tablet Take 500 mg by mouth daily as needed for mild pain.    Historical Provider, MD  amLODipine (NORVASC) 10 MG tablet Take 1 tablet (10 mg total) by mouth daily. 03/05/16   Orson Eva, MD  clopidogrel (PLAVIX) 75 MG tablet Take 1 tablet (75 mg total) by mouth daily. For Afib/TIA--restart 03/07/16 03/07/16   Orson Eva, MD  dexamethasone (DECADRON) 4 MG tablet Take 1 tablet (4 mg total) by mouth every 12 (twelve) hours.  X 6 days, then 4 mg once daily 03/04/16   Orson Eva, MD  divalproex (DEPAKOTE) 500 MG DR tablet Take 1 tablet (500 mg total) by mouth every 12 (twelve) hours. 03/04/16   Orson Eva, MD  insulin aspart (NOVOLOG) 100 UNIT/ML injection Inject 100 Units into the skin. Before meals and at bedtime.  Follow sliding scale instruction.    Historical Provider, MD  levofloxacin (LEVAQUIN) 500 MG tablet Take 500 mg by mouth daily. Tomorrow last dose - 03/17/16 per daughter [name]    Historical Provider, MD  mineral oil-hydrophilic petrolatum (AQUAPHOR) ointment Apply topically 2 (two) times daily. Patient taking differently: Apply 1 application topically daily.  09/09/15   Dinah C Ngetich, NP  oxybutynin (DITROPAN) 5 MG tablet Take 1 tablet (5 mg total) by mouth every 8 (eight) hours as needed for bladder spasms. 09/03/15   Barton Dubois, MD  pantoprazole (PROTONIX) 40 MG tablet Take 40 mg by mouth daily. For GERD.    Historical Provider, MD  polyethylene glycol (MIRALAX / GLYCOLAX) packet Take  17 g by mouth daily. Patient taking differently: Take 17 g by mouth daily as needed for mild constipation.  09/03/15   Barton Dubois, MD  prochlorperazine (COMPAZINE) 10 MG tablet Take 10 mg by mouth every 6 (six) hours as needed for nausea or vomiting. Reported on 01/10/2016    Historical Provider, MD    Family History Family History  Problem Relation Age of Onset  . Heart disease Mother   . Cancer Father     Lung cancer  . Emphysema Brother   . Coronary artery disease Son     Social History Social History  Substance Use Topics  . Smoking status: Never Smoker  . Smokeless tobacco: Never Used  . Alcohol use 10.5 oz/week    21 Standard drinks or equivalent per week     Comment: 3 /day( bourbon)     Allergies   Codeine and Docetaxel   Review of Systems Review of Systems  Respiratory: Negative for shortness of breath.   Cardiovascular: Negative for chest pain.  Neurological: Positive for weakness. Negative for syncope.  All other systems reviewed and are negative.    Physical Exam Updated Vital Signs BP 117/65   Pulse 60   Temp 97.5 F (36.4 C) (Oral)   Resp 14   Ht 5\' 9"  (1.753 m)   Wt 191 lb (86.6 kg)   SpO2 100%   BMI 28.21 kg/m   Physical Exam  Constitutional: He is oriented to person, place, and time. He appears well-developed and well-nourished.  Hard of hearing   HENT:  Head: Normocephalic and atraumatic.  Eyes: EOM are normal.  Neck: Normal range of motion.  Cardiovascular: Normal rate, normal heart sounds and intact distal pulses.   Pacer in left chest and port in right chest   Pulmonary/Chest: Effort normal and breath sounds normal. No respiratory distress.  Abdominal: Soft. He exhibits no distension. There is no tenderness.  Musculoskeletal: Normal range of motion. He exhibits edema.  Symmetric BLE edema    Neurological: He is alert and oriented to person, place, and time.  Skin: Skin is warm and dry.  Scattered ecchymosis to extremities     Psychiatric: He has a normal mood and affect. Judgment normal.  Nursing note and vitals reviewed.   ED Treatments / Results  DIAGNOSTIC STUDIES:  Oxygen Saturation is 100% on RA, normal by my interpretation.    COORDINATION OF CARE:  10:51 AM Discussed treatment plan with  pt at bedside and pt agreed to plan.  Labs (all labs ordered are listed, but only abnormal results are displayed) Labs Reviewed  BASIC METABOLIC PANEL - Abnormal; Notable for the following:       Result Value   Sodium 126 (*)    Potassium 6.2 (*)    Chloride 93 (*)    CO2 19 (*)    Glucose, Bld 546 (*)    BUN 77 (*)    Creatinine, Ser 1.94 (*)    Calcium 8.8 (*)    GFR calc non Af Amer 30 (*)    GFR calc Af Amer 35 (*)    All other components within normal limits  CBC WITH DIFFERENTIAL/PLATELET - Abnormal; Notable for the following:    WBC 12.5 (*)    RBC 4.20 (*)    MCV 101.2 (*)    MCH 36.9 (*)    MCHC 36.5 (*)    Platelets 85 (*)    Neutro Abs 11.7 (*)    Lymphs Abs 0.4 (*)    All other components within normal limits  CBG MONITORING, ED - Abnormal; Notable for the following:    Glucose-Capillary 574 (*)    All other components within normal limits  URINALYSIS, ROUTINE W REFLEX MICROSCOPIC (NOT AT [facility])  BASIC METABOLIC PANEL    EKG  EKG Interpretation  Date/Time:  Wednesday March 25 2016 10:57:48 EDT Ventricular Rate:  85 PR Interval:    QRS Duration: 196 QT Interval:  440 QTC Calculation: 458 R Axis:   87 Text Interpretation:  VENTRICULAR PACED RHYTHM Interpretation limited secondary to artifact Confirmed by Wilson Singer  MD, Negan Grudzien [id number]) on 03/25/2016 11:46:44 AM       Radiology No results found.  Procedures Procedures (including critical care time)  CRITICAL CARE Performed by: [name] Total critical care time: 35 minutes Critical care time was exclusive of separately billable procedures and treating other patients. Critical care was necessary to treat or prevent  imminent or life-threatening deterioration. Critical care was time spent personally by me on the following activities: development of treatment plan with patient and/or surrogate as well as nursing, discussions with consultants, evaluation of patient's response to treatment, examination of patient, obtaining history from patient or surrogate, ordering and performing treatments and interventions, ordering and review of laboratory studies, ordering and review of radiographic studies, pulse oximetry and re-evaluation of patient's condition.   Medications Ordered in ED Medications  calcium gluconate 1 g in sodium chloride 0.9 % 100 mL IVPB (not administered)  sodium bicarbonate injection 50 mEq (not administered)  sodium chloride 0.9 % bolus 1,000 mL (not administered)  insulin aspart (novoLOG) injection 12 Units (not administered)  sodium chloride 0.9 % bolus 500 mL (0 mLs Intravenous Stopped 03/25/16 1150)     Initial Impression / Assessment and Plan / ED Course  I have reviewed the triage vital signs and the nursing notes.  Pertinent labs & imaging results that were available during my care of the patient were reviewed by me and considered in my medical decision making (see chart for details).  Clinical Course    86yM with generalized weakness. Orthostatic. I suspect this is the reason for his fall. He is very hard of hearing and it's hard to get a good history. It does not sound like he actually lost consciousness though. Suspect dehydrated 2/2 severe hyperglycemia. Recent admission with seizure and imaging with possible brian mets/vasogenic edema.  Discharge medications include decadron.  Mild metabolic acidosis, but this may  be from renal dysfunction as opossed to DKA as he there is no elevation in anion gap. Hyperkalemia with lab noting hemolysis. EKG is paced. Will give Ca/bicarb/insulin and repeat BMP. IVF. Will defer additional insulin in terms of gtt/bolus to admission team.   Final  Clinical Impressions(s) / ED Diagnoses   Final diagnoses:  Dehydration  Hyperglycemia  AKI (acute kidney injury) ([place])  Hyperkalemia    New Prescriptions New Prescriptions   No medications on file   I personally preformed the services scribed in my presence. The recorded information has been reviewed is accurate. Gabriel Manifold, MD.     Gabriel Manifold, MD 03/30/16 [phone]

## 2016-03-25 NOTE — ED Notes (Signed)
Attempted report x1. 

## 2016-03-25 NOTE — Consult Note (Signed)
NEURO HOSPITALIST CONSULT NOTE   Requestig physician: Dr. Saralyn Pilar   Reason for Consult: Generalized weakness   History obtained from:  Daughter  HPI:                                                                                                                                          [name] is an 83 y.o. male who was discharged from [facility] directly to [facility] for rehab. Patient was discharged from [facility] 9 days ago. At that time he was feeling near his baseline. However, he quickly deteriorated developing generalized weakness which is been constant and progressive. Denies any focal complaint such as seizure, chest pain, shortness breath, palpitations, nausea, vomiting, abdominal pain, dysuria, frequency, fevers. Patient was seen by his neurologist 8 days ago and no changes were made to his medical regimen. Per daughter at bedside patient has been immobile and not taking part in his exercise regime since he has come home from [facility]. In addition he was found to be dehydrated on his recent neurology follow-up. Daughter states that she has been giving him lots of water however he has not been eating a significant amount. Currently patient is showing no weakness but does have a flat affect and is very hard of hearing. His sodium is 126, potassium 6.2, white blood cell count 12.5.  Past Medical History:  Diagnosis Date  . Allergy   . Arthritis    degenerative-knees  . Atrial fibrillation ([place]) 1990  . Bladder cancer ([place]) 2010  . Cancer ([place]) 05/05/07-06/15/07   parotid gland/66.4 GY  . Cancer ([place])    left eyelid   . Chronic kidney disease    positive cytology  . Coronary artery disease   . Diabetes mellitus without complication ([place])    meds d/c  2-3 year ago  . Dry eyes   . Dyslipidemia   . Dysrhythmia    HX A FIB  . GERD (gastroesophageal reflux disease)   . Heart murmur    2/6 systolic  . History of chemotherapy     Hx carboplatin/60fu  .  History of radiation therapy 05/05/07-06/15/07   right parotid through subclavicular region/ mid jugularlymph node chain  . History of radiation therapy 05/05/07-06/15/07   Right parotid/hemineck,supraclavicular  . HOH (hard of hearing)   . Hypertension    labile  . Pacemaker    2003 vent paced , rate 69  . Pneumonia    hx of   . Prostate hypertrophy   . Right groin hernia   . Skin cancer    HX BASAL CELL REMOVED  . Stroke [facility]) 2003   TIA  . Tinnitus    right   . Urinary tract infection    hx of   . Xerostomia  limited    Past Surgical History:  Procedure Laterality Date  . BACK SURGERY    . CARDIAC CATHETERIZATION  09/20/2012   EF 50-55%, moderately calcified aortic valve leaflets, mild-moderate aortic valve stenosis, LA-appendage moderately dilated, moderate regurg of the mitral valve, RA moderately dilated  . CARDIOVASCULAR STRESS TEST  09/05/2009   Pharmalogical stress test w/o chest pain or EKG changes for ischemia  . CRANIOTOMY  12/14/2011   Procedure: CRANIOTOMY HEMATOMA EVACUATION SUBDURAL;  Surgeon: Ophelia Charter, MD;  Location: [place] [facility];  Service: Neurosurgery;  Laterality: Left;  LEFT Craniotomy for subdural   . CYSTOSCOPY  01/2011   neg  . CYSTOSCOPY W/ RETROGRADES  04/01/2012   Procedure: CYSTOSCOPY WITH RETROGRADE PYELOGRAM;  Surgeon: Claybon Jabs, MD;  Location: [facility];  Service: Urology;  Laterality: Bilateral;  . CYSTOSCOPY WITH BIOPSY  04/01/2012   Procedure: CYSTOSCOPY WITH BIOPSY;  Surgeon: Claybon Jabs, MD;  Location: [facility];  Service: Urology;  Laterality: N/A;  BLADDER BIOPSIES and bladder washings  . CYSTOSCOPY WITH BIOPSY  05/02/2012   Procedure: CYSTOSCOPY WITH BIOPSY;  Surgeon: Claybon Jabs, MD;  Location: [facility];  Service: Urology;  Laterality: N/A;  . CYSTOSCOPY WITH STENT PLACEMENT  05/02/2012   Procedure: CYSTOSCOPY WITH STENT PLACEMENT;  Surgeon: Claybon Jabs, MD;   Location: [facility];  Service: Urology;  Laterality: Bilateral;  . CYSTOSCOPY WITH URETEROSCOPY AND STENT PLACEMENT Right 02/15/2015   Procedure: CYSTOSCOPY WITH RIGHT URETEROSCOPY, RENAL PELVIC WASHINGS, STENT, RETROGRADE;  Surgeon: Kathie Rhodes, MD;  Location: [facility];  Service: Urology;  Laterality: Right;  . CYSTOSCOPY/RETROGRADE/URETEROSCOPY  05/02/2012   Procedure: CYSTOSCOPY/RETROGRADE/URETEROSCOPY;  Surgeon: Claybon Jabs, MD;  Location: [facility];  Service: Urology;  Laterality: Bilateral;  CYSTOSCOPY BILATERAL RETROGRADE PYLOGRAM BILATERAL URETEROSCOPY AND BIOPSY    . CYSTOSCOPY/RETROGRADE/URETEROSCOPY Bilateral 01/21/2015   Procedure: CYSTOSCOPY/RETROGRADE/ BLADDER BIOPSY;  Surgeon: Kathie Rhodes, MD;  Location: [facility];  Service: Urology;  Laterality: Bilateral;  . LOWER EXTREMITY ARTERIAL DOPPLER  09/28/2011   No evidence of arterial insufficiency.   . [place]  . PACEMAKER GENERATOR CHANGE  05/23/2002   [facility] [place], [place], serial [phone]  . PACEMAKER GENERATOR CHANGE N/A 09/01/2013   Procedure: PACEMAKER GENERATOR CHANGE;  Surgeon: Sanda Klein, MD;  Location: [facility];  Service: Cardiovascular;  Laterality: N/A;  . [place]   last changed 2003  . PAROTIDECTOMY     right  . URETEROSCOPY  04/01/2012   Procedure: URETEROSCOPY;  Surgeon: Claybon Jabs, MD;  Location: [facility];  Service: Urology;  Laterality: Right;    Family History  Problem Relation Age of Onset  . Heart disease Mother   . Cancer Father     Lung cancer  . Emphysema Brother   . Coronary artery disease Son       Social History:  reports that he has never smoked. He has never used smokeless tobacco. He reports that he drinks about 10.5 oz of alcohol per week . He reports that he does not use drugs.  Allergies  Allergen Reactions  . Codeine Other (See Comments)    Blisters between fingers  .  Docetaxel Rash    MEDICATIONS:  No current facility-administered medications for this encounter.    Current Outpatient Prescriptions  Medication Sig Dispense Refill  . acetaminophen (TYLENOL) 500 MG tablet Take 500 mg by mouth daily as needed for mild pain.    Marland Kitchen ALPRAZolam (XANAX) 0.5 MG tablet Take 0.5 mg by mouth at bedtime as needed for sleep.     Marland Kitchen amLODipine (NORVASC) 10 MG tablet Take 1 tablet (10 mg total) by mouth daily. 30 tablet 0  . clopidogrel (PLAVIX) 75 MG tablet Take 1 tablet (75 mg total) by mouth daily. For Afib/TIA--restart 03/07/16 30 tablet 0  . dexamethasone (DECADRON) 4 MG tablet Take 1 tablet (4 mg total) by mouth every 12 (twelve) hours. X 6 days, then 4 mg once daily (Patient taking differently: Take 4 mg by mouth daily. ) 36 tablet 0  . divalproex (DEPAKOTE) 500 MG DR tablet Take 1 tablet (500 mg total) by mouth every 12 (twelve) hours. 60 tablet 0  . insulin aspart (NOVOLOG) 100 UNIT/ML injection Inject 8-10 Units into the skin 2 (two) times daily. Per sliding scale.    . mineral oil-hydrophilic petrolatum (AQUAPHOR) ointment Apply topically 2 (two) times daily. (Patient taking differently: Apply 1 application topically daily. ) 420 g 0  . pantoprazole (PROTONIX) 40 MG tablet Take 40 mg by mouth daily. For GERD.    Marland Kitchen polyethylene glycol (MIRALAX / GLYCOLAX) packet Take 17 g by mouth daily. (Patient taking differently: Take 17 g by mouth daily as needed for mild constipation. )    . prochlorperazine (COMPAZINE) 10 MG tablet Take 10 mg by mouth every 6 (six) hours as needed for nausea or vomiting. Reported on 01/10/2016    . oxybutynin (DITROPAN) 5 MG tablet Take 1 tablet (5 mg total) by mouth every 8 (eight) hours as needed for bladder spasms. (Patient not taking: Reported on 03/25/2016)        ROS:                                                                                                                                        History obtained from daughter  General ROS: negative for - chills, fatigue, fever, night sweats, weight gain or weight loss Psychological ROS: negative for - behavioral disorder, hallucinations, memory difficulties, mood swings or suicidal ideation Ophthalmic ROS: negative for - blurry vision, double vision, eye pain or loss of vision ENT ROS: negative for - epistaxis, nasal discharge, oral lesions, sore throat, tinnitus or vertigo Allergy and Immunology ROS: negative for - hives or itchy/watery eyes Hematological and Lymphatic ROS: negative for - bleeding problems, bruising or swollen lymph nodes Endocrine ROS: negative for - galactorrhea, hair pattern changes, polydipsia/polyuria or temperature intolerance Respiratory ROS: negative for - cough, hemoptysis, shortness of breath or wheezing Cardiovascular ROS: negative for - chest pain, dyspnea on exertion, edema or irregular heartbeat Gastrointestinal ROS: negative for - abdominal pain, diarrhea, hematemesis, nausea/vomiting or stool  incontinence Genito-Urinary ROS: negative for - dysuria, hematuria, incontinence or urinary frequency/urgency Musculoskeletal ROS: negative for - joint swelling or muscular weakness Neurological ROS: as noted in HPI Dermatological ROS: negative for rash and skin lesion changes   Blood pressure 100/67, pulse 60, temperature 97.5 F (36.4 C), temperature source Oral, resp. rate 10, height 5\' 9"  (1.753 m), weight 86.6 kg (191 lb), SpO2 98 %.   Neurologic Examination:                                                                                                      HEENT-  Normocephalic, no lesions, without obvious abnormality.  Normal external eye and conjunctiva.  Normal TM's bilaterally.  Normal auditory canals and external ears. Normal external nose, mucus membranes and septum.  Normal pharynx. Cardiovascular- S1, S2 normal, pulses  palpable throughout   Lungs- chest clear, no wheezing, rales, normal symmetric air entry Abdomen- normal findings: bowel sounds normal Extremities- no edema Lymph-no adenopathy palpable Musculoskeletal-no joint tenderness, deformity or swelling Skin-warm and dry, no hyperpigmentation, vitiligo, or suspicious lesions  Neurological Examination Mental Status: Alert, oriented, thought content appropriate.  Speech fluent without evidence of aphasia.  Able to follow 3 step commands without difficulty. Cranial Nerves: II:  Visual fields grossly normal, pupils equal, round, reactive to light and accommodation III,IV, VI: ptosis not present, extra-ocular motions intact bilaterally V,VII: smile symmetric, facial light touch sensation normal bilaterally VIII: hearing normal bilaterally IX,X: uvula rises symmetrically XI: bilateral shoulder shrug XII: midline tongue extension Motor: Right : Upper extremity   5/5    Left:     Upper extremity   5/5  Lower extremity   5/5     Lower extremity   5/5 Tone and bulk:normal tone throughout; no atrophy noted Sensory: Pinprick and light touch intact throughout, bilaterally Deep Tendon Reflexes: 1+ and symmetric throughout Plantars: Right: downgoing   Left: downgoing Cerebellar: normal finger-to-nose,       Lab Results: Basic Metabolic Panel:  Recent Labs Lab 03/25/16 1027 03/25/16 1156  NA 126* 128*  K 6.2* 5.6*  CL 93* 93*  CO2 19* 24  GLUCOSE 546* 500*  BUN 77* 74*  CREATININE 1.94* 1.76*  CALCIUM 8.8* 8.9    Liver Function Tests: No results for input(s): AST, ALT, ALKPHOS, BILITOT, PROT, ALBUMIN in the last 168 hours. No results for input(s): LIPASE, AMYLASE in the last 168 hours. No results for input(s): AMMONIA in the last 168 hours.  CBC:  Recent Labs Lab 03/25/16 1027  WBC 12.5*  NEUTROABS 11.7*  HGB 15.5  HCT 42.5  MCV 101.2*  PLT 85*    Cardiac Enzymes: No results for input(s): CKTOTAL, CKMB, CKMBINDEX,  TROPONINI in the last 168 hours.  Lipid Panel: No results for input(s): CHOL, TRIG, HDL, CHOLHDL, VLDL, LDLCALC in the last 168 hours.  CBG:  Recent Labs Lab 03/25/16 [id number]*    Microbiology: Results for orders placed or performed during the hospital encounter of 02/28/16  MRSA PCR Screening     Status: Abnormal   Collection Time: 02/29/16 10:33 AM  Result Value Ref Range Status   MRSA by PCR POSITIVE (A) NEGATIVE Final    Comment:        The GeneXpert MRSA Assay (FDA approved for NASAL specimens only), is one component of a comprehensive MRSA colonization surveillance program. It is not intended to diagnose MRSA infection nor to guide or monitor treatment for MRSA infections. RESULT CALLED TO, READ BACK BY AND VERIFIED WITH: D.MUHORO RN AT 1242 02/29/16 BY A.[name]     Coagulation Studies: No results for input(s): LABPROT, INR in the last 72 hours.  Imaging: No results found.     Assessment and plan per attending neurologist  [name] PA-C [facility] [phone]  03/25/2016, 3:18 PM   Assessment/Plan:  Is a 82 year old male presenting to the emergency department with generalized weakness for the past 9 days after returning home from rehabilitation.  Per daughter he has been very non-mobile and has not been taking part of his exercise regime while at home. Patient has also been found to be dehydrated, hyponatremic, hypokalemic while in the emergency department. Exam is nonfocal and shows no weakness. Likely etiology is dehydration and metabolic abnormalities.  Recommend: -Treating underlying metabolic issues -Obtain Depakote level and LFTs -Physical therapy

## 2016-03-25 NOTE — ED Triage Notes (Addendum)
Pt brought in by EMS due to pt beomcing weak in the shower today. Per EMS pts daughter help lower pt to floor. Pt did not hit head or fall. Pt a&ox4. Pt was orthostatic per EMS. Pt CBG was 570. Pt has received 500cc of fluid.

## 2016-03-25 NOTE — H&P (Signed)
History and Physical    [name] [id number] DOB: 03-21-1930 DOA: 03/25/2016  PCP: [name], MD Patient coming from: home  Chief Complaint: Generalized weakness  HPI: [name] is a 82 y.o. male.    Level V caveat applies as patient is unable to provide significant portions of history due to extreme difficulty with hearing and general ill feeling and lack of participation in admission. History provided by patients daughter and EDP.   Patient was discharged from [facility] directly to [facility] for rehab. Patient was discharged from [facility] 9 days ago. At that time he was feeling near his baseline. However, he quickly deteriorated developing generalized weakness which is been constant and progressive. Denies any focal complaint such as seizure, chest pain, shortness breath, palpitations, nausea, vomiting, abdominal pain, dysuria, frequency, fevers. Patient was seen by his neurologist 8 days ago and no changes were made to his medical regimen. Patient is not yet seen oncology. Patient had a Depakote level checked at time of his neuro 0.0 was told it was normal. On day of admission patient was ambulating in the bathroom when he lost all of his strength was unable to get up. He was assisted to the floor by his daughter. There is no reported vertigo, dizziness, LOC, head trauma.   No EtOH use since before previous admission.   ED Course: Objective findings outlined below. Patient given 12 units of NovoLog, sodium bicarbonate, 1500 mL of normal saline and calcium gluconate to help with noted metabolic derangements.  Review of Systems: As per HPI otherwise 10 point review of systems negative.   Ambulatory Status: Profound generalized weakness.  Past Medical History:  Diagnosis Date  . Allergy   . Arthritis    degenerative-knees  . Atrial fibrillation ([place]) 1990  . Bladder cancer ([place]) 2010  . Cancer ([place]) 05/05/07-06/15/07   parotid gland/66.4 GY  . Cancer  ([place])    left eyelid   . Chronic kidney disease    positive cytology  . Coronary artery disease   . Diabetes mellitus without complication ([place])    meds d/c  2-3 year ago  . Dry eyes   . Dyslipidemia   . Dysrhythmia    HX A FIB  . GERD (gastroesophageal reflux disease)   . Heart murmur    2/6 systolic  . History of chemotherapy     Hx carboplatin/88fu  . History of radiation therapy 05/05/07-06/15/07   right parotid through subclavicular region/ mid jugularlymph node chain  . History of radiation therapy 05/05/07-06/15/07   Right parotid/hemineck,supraclavicular  . HOH (hard of hearing)   . Hypertension    labile  . Pacemaker    2003 vent paced , rate 69  . Pneumonia    hx of   . Prostate hypertrophy   . Right groin hernia   . Skin cancer    HX BASAL CELL REMOVED  . Stroke [facility]) 2003   TIA  . Tinnitus    right   . Urinary tract infection    hx of   . Xerostomia    limited    Past Surgical History:  Procedure Laterality Date  . BACK SURGERY    . CARDIAC CATHETERIZATION  09/20/2012   EF 50-55%, moderately calcified aortic valve leaflets, mild-moderate aortic valve stenosis, LA-appendage moderately dilated, moderate regurg of the mitral valve, RA moderately dilated  . CARDIOVASCULAR STRESS TEST  09/05/2009   Pharmalogical stress test w/o chest pain or EKG changes for ischemia  .  CRANIOTOMY  12/14/2011   Procedure: CRANIOTOMY HEMATOMA EVACUATION SUBDURAL;  Surgeon: Ophelia Charter, MD;  Location: [facility];  Service: Neurosurgery;  Laterality: Left;  LEFT Craniotomy for subdural   . CYSTOSCOPY  01/2011   neg  . CYSTOSCOPY W/ RETROGRADES  04/01/2012   Procedure: CYSTOSCOPY WITH RETROGRADE PYELOGRAM;  Surgeon: Claybon Jabs, MD;  Location: [facility];  Service: Urology;  Laterality: Bilateral;  . CYSTOSCOPY WITH BIOPSY  04/01/2012   Procedure: CYSTOSCOPY WITH BIOPSY;  Surgeon: Claybon Jabs, MD;  Location: [facility];  Service:  Urology;  Laterality: N/A;  BLADDER BIOPSIES and bladder washings  . CYSTOSCOPY WITH BIOPSY  05/02/2012   Procedure: CYSTOSCOPY WITH BIOPSY;  Surgeon: Claybon Jabs, MD;  Location: [facility];  Service: Urology;  Laterality: N/A;  . CYSTOSCOPY WITH STENT PLACEMENT  05/02/2012   Procedure: CYSTOSCOPY WITH STENT PLACEMENT;  Surgeon: Claybon Jabs, MD;  Location: [facility];  Service: Urology;  Laterality: Bilateral;  . CYSTOSCOPY WITH URETEROSCOPY AND STENT PLACEMENT Right 02/15/2015   Procedure: CYSTOSCOPY WITH RIGHT URETEROSCOPY, RENAL PELVIC WASHINGS, STENT, RETROGRADE;  Surgeon: Kathie Rhodes, MD;  Location: [facility];  Service: Urology;  Laterality: Right;  . CYSTOSCOPY/RETROGRADE/URETEROSCOPY  05/02/2012   Procedure: CYSTOSCOPY/RETROGRADE/URETEROSCOPY;  Surgeon: Claybon Jabs, MD;  Location: [facility];  Service: Urology;  Laterality: Bilateral;  CYSTOSCOPY BILATERAL RETROGRADE PYLOGRAM BILATERAL URETEROSCOPY AND BIOPSY    . CYSTOSCOPY/RETROGRADE/URETEROSCOPY Bilateral 01/21/2015   Procedure: CYSTOSCOPY/RETROGRADE/ BLADDER BIOPSY;  Surgeon: Kathie Rhodes, MD;  Location: [facility];  Service: Urology;  Laterality: Bilateral;  . LOWER EXTREMITY ARTERIAL DOPPLER  09/28/2011   No evidence of arterial insufficiency.   . [name]  . PACEMAKER GENERATOR CHANGE  05/23/2002   [facility], [place], serial [phone]  . PACEMAKER GENERATOR CHANGE N/A 09/01/2013   Procedure: PACEMAKER GENERATOR CHANGE;  Surgeon: Sanda Klein, MD;  Location: [facility];  Service: Cardiovascular;  Laterality: N/A;  . [place]   last changed 2003  . PAROTIDECTOMY     right  . URETEROSCOPY  04/01/2012   Procedure: URETEROSCOPY;  Surgeon: Claybon Jabs, MD;  Location: [facility];  Service: Urology;  Laterality: Right;    Social History   Social History  . Marital status: Widowed    Spouse name: N/A  . Number  of children: 2  . Years of education: 12   Occupational History  . retired    Social History Main Topics  . Smoking status: Never Smoker  . Smokeless tobacco: Never Used  . Alcohol use 10.5 oz/week    21 Standard drinks or equivalent per week     Comment: 3 /day( bourbon)  . Drug use: No  . Sexual activity: No   Other Topics Concern  . Not on file   Social History Narrative   Lives at home. Got released from [place] home yesterday (03/17/16)   Caffeine use:     Allergies  Allergen Reactions  . Codeine Other (See Comments)    Blisters between fingers  . Docetaxel Rash    Family History  Problem Relation Age of Onset  . Heart disease Mother   . Cancer Father     Lung cancer  . Emphysema Brother   . Coronary artery disease Son     Prior to Admission medications   Medication Sig Start Date End Date Taking? Authorizing Provider  acetaminophen (TYLENOL) 500 MG  tablet Take 500 mg by mouth daily as needed for mild pain.    Historical Provider, MD  amLODipine (NORVASC) 10 MG tablet Take 1 tablet (10 mg total) by mouth daily. 03/05/16   Orson Eva, MD  clopidogrel (PLAVIX) 75 MG tablet Take 1 tablet (75 mg total) by mouth daily. For Afib/TIA--restart 03/07/16 03/07/16   Orson Eva, MD  dexamethasone (DECADRON) 4 MG tablet Take 1 tablet (4 mg total) by mouth every 12 (twelve) hours. X 6 days, then 4 mg once daily 03/04/16   Orson Eva, MD  divalproex (DEPAKOTE) 500 MG DR tablet Take 1 tablet (500 mg total) by mouth every 12 (twelve) hours. 03/04/16   Orson Eva, MD  insulin aspart (NOVOLOG) 100 UNIT/ML injection Inject 100 Units into the skin. Before meals and at bedtime.  Follow sliding scale instruction.    Historical Provider, MD  levofloxacin (LEVAQUIN) 500 MG tablet Take 500 mg by mouth daily. Tomorrow last dose - 03/17/16 per daughter [name]    Historical Provider, MD  mineral oil-hydrophilic petrolatum (AQUAPHOR) ointment Apply topically 2 (two) times daily. Patient taking  differently: Apply 1 application topically daily.  09/09/15   Dinah C Ngetich, NP  oxybutynin (DITROPAN) 5 MG tablet Take 1 tablet (5 mg total) by mouth every 8 (eight) hours as needed for bladder spasms. 09/03/15   Barton Dubois, MD  pantoprazole (PROTONIX) 40 MG tablet Take 40 mg by mouth daily. For GERD.    Historical Provider, MD  polyethylene glycol (MIRALAX / GLYCOLAX) packet Take 17 g by mouth daily. Patient taking differently: Take 17 g by mouth daily as needed for mild constipation.  09/03/15   Barton Dubois, MD  prochlorperazine (COMPAZINE) 10 MG tablet Take 10 mg by mouth every 6 (six) hours as needed for nausea or vomiting. Reported on 01/10/2016    Historical Provider, MD    Physical Exam: Vitals:   03/25/16 1430 03/25/16 1445 03/25/16 1500 03/25/16 1515  BP: 109/68 103/70 100/67 113/69  Pulse: 60 (!) 59 60 61  Resp: 14 12 10 11   Temp:      TempSrc:      SpO2: 100% 99% 98% 99%  Weight:      Height:         General:  Appears calm and comfortable Eyes: Mild scleral injection bilaterally, EOMI, normal lids ENT:  grossly normal hearing, lips & tongue, mmm Neck:  no LAD, masses or thyromegaly Cardiovascular:  RRR, III/VI systolic murmur. Trace LE edema.  Respiratory:  CTA bilaterally, no w/r/r. Normal respiratory effort. Abdomen:  soft, ntnd, NABS Skin:  no rash or induration seen on limited exam Musculoskeletal: 3/5 strength in upper and lower extremities, no bony abnromality Psychiatric:  grossly normal mood and affect, very hard of hearing. AOx3 Neurologic:  CN 2-12 grossly intact, moves all extremities in coordinated fashion, sensation intact  Labs on Admission: I have personally reviewed following labs and imaging studies  CBC:  Recent Labs Lab 03/25/16 1027  WBC 12.5*  NEUTROABS 11.7*  HGB 15.5  HCT 42.5  MCV 101.2*  PLT 85*   Basic Metabolic Panel:  Recent Labs Lab 03/25/16 1027 03/25/16 1156  NA 126* 128*  K 6.2* 5.6*  CL 93* 93*  CO2 19* 24    GLUCOSE 546* 500*  BUN 77* 74*  CREATININE 1.94* 1.76*  CALCIUM 8.8* 8.9   GFR: Estimated Creatinine Clearance: 34.1 mL/min (by C-G formula based on SCr of 1.76 mg/dL (H)). Liver Function Tests: No results for input(s): AST, ALT,  ALKPHOS, BILITOT, PROT, ALBUMIN in the last 168 hours. No results for input(s): LIPASE, AMYLASE in the last 168 hours. No results for input(s): AMMONIA in the last 168 hours. Coagulation Profile: No results for input(s): INR, PROTIME in the last 168 hours. Cardiac Enzymes: No results for input(s): CKTOTAL, CKMB, CKMBINDEX, TROPONINI in the last 168 hours. BNP (last 3 results) No results for input(s): PROBNP in the last 8760 hours. HbA1C: No results for input(s): HGBA1C in the last 72 hours. CBG:  Recent Labs Lab 03/25/16 1027  GLUCAP 574*   Lipid Profile: No results for input(s): CHOL, HDL, LDLCALC, TRIG, CHOLHDL, LDLDIRECT in the last 72 hours. Thyroid Function Tests: No results for input(s): TSH, T4TOTAL, FREET4, T3FREE, THYROIDAB in the last 72 hours. Anemia Panel: No results for input(s): VITAMINB12, FOLATE, FERRITIN, TIBC, IRON, RETICCTPCT in the last 72 hours. Urine analysis:    Component Value Date/Time   COLORURINE YELLOW 03/25/2016 1200   APPEARANCEUR CLEAR 03/25/2016 1200   LABSPEC 1.028 03/25/2016 1200   PHURINE 5.5 03/25/2016 1200   GLUCOSEU >1000 (A) 03/25/2016 1200   HGBUR SMALL (A) 03/25/2016 1200   BILIRUBINUR NEGATIVE 03/25/2016 1200   KETONESUR NEGATIVE 03/25/2016 1200   PROTEINUR NEGATIVE 03/25/2016 1200   UROBILINOGEN 1.0 04/03/2012 1018   NITRITE NEGATIVE 03/25/2016 1200   LEUKOCYTESUR NEGATIVE 03/25/2016 1200    Creatinine Clearance: Estimated Creatinine Clearance: 34.1 mL/min (by C-G formula based on SCr of 1.76 mg/dL (H)).  Sepsis Labs: @LABRCNTIP (procalcitonin:4,lacticidven:4) )No results found for this or any previous visit (from the past 240 hour(s)).   Radiological Exams on Admission: No results  found.  EKG: Independently reviewed.   Assessment/Plan Active Problems:   Diabetes mellitus ([place])   Hypertension   Seizures ([name]   Hyperglycemia   Left frontal lobe lesion   AKI (acute kidney injury) ([place])   Hyperkalemia   Hydronephrosis    Generalized weakness: Likely multifactorial including dehydration, metabolic derangement, medication induced from Depakote, progressive metastatic disease. - Pt/OT - anticipate discharge back to [facility] of other SNF/Rehab - Depakot level - Metabolic mgt as below - Neuro/Onc as below  Diabetes: likely steroid induced. Gluc 546 on admission. Ketones in urine but no DKA. H/o Pre-DM as last A1c 5.5 - SSI - A1c anticipate small increase as only 1 mo since last check.  Seizures: Started at last admission. 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 Pt followed by Neurosurgery, NEurology, and Oncology. Per review of DC note   ""Abnormal Brain CT. The patient's case was presented at our multidisciplinary brain oncology conference yesterday including neuroradiology, three neurosurgeons, and two radiation oncologists. In reviewing the imaging (limited by the fact we cannot recommend MRI), the patient's anomalies enchance on non-contrasted studies, though not on the contrasted images. At the conclusion of the discussion, recommendations were made to repeat a CT with and without contrast with SRS protocol in 2 months. Our brain navigator will coordinate this with the patient and family, and Dr. Alen Blew will continue to follow him for his history of bladder cancer."  Pt was started on Decadron at last admission as well as Depakote. Pt saw Neuro 6 days ago and had no medication changes. Pt has not seen oncology since DC. No further reoprted seizures.  - continue Depakote - Continue Decadron - Neuro consulted. - recommending CT head w/ w/o contrast - consider Oncology consult - depending on CT results (Saw Dr. 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   AKI: Cr 1.94 on admission. Likely from dehydration and metabolic strain.  - IVF, BMETs  Pseudohyponatremia: 128 with glucose greater than 500. Corrected sodium of 135. - IVF, BMETs  Hyperkalemia. 6.2 initially. Repeat 5.6 after IVF. No hemolysis noted. EKG showing paced rhythm with peaked T waves. Ca gluc, insulin and IVF given in ED.  - IVF - BMET  HTN: - continue norvasc in am  H/o Stroke/CAD: - continue plavix,   GERD: - continue ppi  ETOH abuse: No further ETOH since previous admission   ANxiety: - continue xanax prn    DVT prophylaxis: Hep  Code Status: full  Family Communication: daughter  Disposition Plan: pending improvement  Consults called: neurology  Admission status: tele obs    MERRELL, DAVID J MD [facility]  If 7PM-7AM, please contact night-coverage [email or web address] Password [email or web address]  03/25/2016, 4:08 PM

## 2016-03-26 DIAGNOSIS — D696 Thrombocytopenia, unspecified: Secondary | ICD-10-CM | POA: Diagnosis not present

## 2016-03-26 DIAGNOSIS — D65 Disseminated intravascular coagulation [defibrination syndrome]: Secondary | ICD-10-CM | POA: Diagnosis not present

## 2016-03-26 DIAGNOSIS — N179 Acute kidney failure, unspecified: Secondary | ICD-10-CM | POA: Diagnosis present

## 2016-03-26 DIAGNOSIS — T380X5A Adverse effect of glucocorticoids and synthetic analogues, initial encounter: Secondary | ICD-10-CM | POA: Diagnosis present

## 2016-03-26 DIAGNOSIS — M7989 Other specified soft tissue disorders: Secondary | ICD-10-CM | POA: Diagnosis not present

## 2016-03-26 DIAGNOSIS — C661 Malignant neoplasm of right ureter: Secondary | ICD-10-CM | POA: Diagnosis present

## 2016-03-26 DIAGNOSIS — Z7189 Other specified counseling: Secondary | ICD-10-CM | POA: Diagnosis not present

## 2016-03-26 DIAGNOSIS — B9561 Methicillin susceptible Staphylococcus aureus infection as the cause of diseases classified elsewhere: Secondary | ICD-10-CM | POA: Diagnosis not present

## 2016-03-26 DIAGNOSIS — E162 Hypoglycemia, unspecified: Secondary | ICD-10-CM | POA: Diagnosis not present

## 2016-03-26 DIAGNOSIS — A419 Sepsis, unspecified organism: Secondary | ICD-10-CM | POA: Diagnosis not present

## 2016-03-26 DIAGNOSIS — R531 Weakness: Secondary | ICD-10-CM | POA: Diagnosis not present

## 2016-03-26 DIAGNOSIS — I10 Essential (primary) hypertension: Secondary | ICD-10-CM | POA: Diagnosis present

## 2016-03-26 DIAGNOSIS — R7881 Bacteremia: Secondary | ICD-10-CM | POA: Diagnosis not present

## 2016-03-26 DIAGNOSIS — K219 Gastro-esophageal reflux disease without esophagitis: Secondary | ICD-10-CM | POA: Diagnosis present

## 2016-03-26 DIAGNOSIS — R569 Unspecified convulsions: Secondary | ICD-10-CM | POA: Diagnosis not present

## 2016-03-26 DIAGNOSIS — Y712 Prosthetic and other implants, materials and accessory cardiovascular devices associated with adverse incidents: Secondary | ICD-10-CM | POA: Diagnosis not present

## 2016-03-26 DIAGNOSIS — R609 Edema, unspecified: Secondary | ICD-10-CM | POA: Diagnosis not present

## 2016-03-26 DIAGNOSIS — E1165 Type 2 diabetes mellitus with hyperglycemia: Secondary | ICD-10-CM | POA: Diagnosis present

## 2016-03-26 DIAGNOSIS — C651 Malignant neoplasm of right renal pelvis: Secondary | ICD-10-CM | POA: Diagnosis present

## 2016-03-26 DIAGNOSIS — G939 Disorder of brain, unspecified: Secondary | ICD-10-CM | POA: Diagnosis not present

## 2016-03-26 DIAGNOSIS — Z515 Encounter for palliative care: Secondary | ICD-10-CM | POA: Diagnosis not present

## 2016-03-26 DIAGNOSIS — T80211D Bloodstream infection due to central venous catheter, subsequent encounter: Secondary | ICD-10-CM | POA: Diagnosis not present

## 2016-03-26 DIAGNOSIS — E875 Hyperkalemia: Secondary | ICD-10-CM | POA: Diagnosis not present

## 2016-03-26 DIAGNOSIS — T80211A Bloodstream infection due to central venous catheter, initial encounter: Secondary | ICD-10-CM | POA: Diagnosis present

## 2016-03-26 DIAGNOSIS — I482 Chronic atrial fibrillation: Secondary | ICD-10-CM | POA: Diagnosis present

## 2016-03-26 DIAGNOSIS — I82623 Acute embolism and thrombosis of deep veins of upper extremity, bilateral: Secondary | ICD-10-CM | POA: Diagnosis present

## 2016-03-26 DIAGNOSIS — T827XXA Infection and inflammatory reaction due to other cardiac and vascular devices, implants and grafts, initial encounter: Secondary | ICD-10-CM | POA: Diagnosis not present

## 2016-03-26 DIAGNOSIS — G9341 Metabolic encephalopathy: Secondary | ICD-10-CM | POA: Diagnosis not present

## 2016-03-26 DIAGNOSIS — C669 Malignant neoplasm of unspecified ureter: Secondary | ICD-10-CM | POA: Diagnosis not present

## 2016-03-26 DIAGNOSIS — N1330 Unspecified hydronephrosis: Secondary | ICD-10-CM | POA: Diagnosis present

## 2016-03-26 DIAGNOSIS — G9340 Encephalopathy, unspecified: Secondary | ICD-10-CM | POA: Diagnosis not present

## 2016-03-26 DIAGNOSIS — I2782 Chronic pulmonary embolism: Secondary | ICD-10-CM | POA: Diagnosis present

## 2016-03-26 DIAGNOSIS — Z8673 Personal history of transient ischemic attack (TIA), and cerebral infarction without residual deficits: Secondary | ICD-10-CM | POA: Diagnosis not present

## 2016-03-26 DIAGNOSIS — R071 Chest pain on breathing: Secondary | ICD-10-CM | POA: Diagnosis not present

## 2016-03-26 DIAGNOSIS — G40909 Epilepsy, unspecified, not intractable, without status epilepticus: Secondary | ICD-10-CM | POA: Diagnosis present

## 2016-03-26 DIAGNOSIS — C659 Malignant neoplasm of unspecified renal pelvis: Secondary | ICD-10-CM | POA: Diagnosis not present

## 2016-03-26 DIAGNOSIS — E860 Dehydration: Secondary | ICD-10-CM | POA: Diagnosis present

## 2016-03-26 DIAGNOSIS — C7931 Secondary malignant neoplasm of brain: Secondary | ICD-10-CM | POA: Diagnosis present

## 2016-03-26 DIAGNOSIS — I2699 Other pulmonary embolism without acute cor pulmonale: Secondary | ICD-10-CM | POA: Diagnosis present

## 2016-03-26 DIAGNOSIS — E46 Unspecified protein-calorie malnutrition: Secondary | ICD-10-CM | POA: Diagnosis present

## 2016-03-26 DIAGNOSIS — R410 Disorientation, unspecified: Secondary | ICD-10-CM | POA: Diagnosis not present

## 2016-03-26 DIAGNOSIS — E871 Hypo-osmolality and hyponatremia: Secondary | ICD-10-CM | POA: Diagnosis present

## 2016-03-26 DIAGNOSIS — A4102 Sepsis due to Methicillin resistant Staphylococcus aureus: Secondary | ICD-10-CM | POA: Diagnosis present

## 2016-03-26 DIAGNOSIS — I2690 Septic pulmonary embolism without acute cor pulmonale: Secondary | ICD-10-CM | POA: Diagnosis not present

## 2016-03-26 DIAGNOSIS — R4182 Altered mental status, unspecified: Secondary | ICD-10-CM | POA: Diagnosis not present

## 2016-03-26 DIAGNOSIS — R079 Chest pain, unspecified: Secondary | ICD-10-CM | POA: Diagnosis not present

## 2016-03-26 DIAGNOSIS — B9562 Methicillin resistant Staphylococcus aureus infection as the cause of diseases classified elsewhere: Secondary | ICD-10-CM | POA: Diagnosis not present

## 2016-03-26 DIAGNOSIS — E43 Unspecified severe protein-calorie malnutrition: Secondary | ICD-10-CM | POA: Diagnosis present

## 2016-03-26 LAB — BASIC METABOLIC PANEL
Anion gap: 12 (ref 5–15)
BUN: 42 mg/dL — AB (ref 6–20)
CO2: 28 mmol/L (ref 22–32)
CREATININE: 1.13 mg/dL (ref 0.61–1.24)
Calcium: 8.7 mg/dL — ABNORMAL LOW (ref 8.9–10.3)
Chloride: 92 mmol/L — ABNORMAL LOW (ref 101–111)
GFR calc Af Amer: >60 mL/min (ref >60)
GFR, EST NON AFRICAN AMERICAN: 58 mL/min — AB (ref >60)
GLUCOSE: 230 mg/dL — AB (ref 65–99)
POTASSIUM: 5.2 mmol/L — AB (ref 3.5–5.1)
SODIUM: 132 mmol/L — AB (ref 135–145)

## 2016-03-26 LAB — CBC
HCT: 41.6 % (ref 39.0–52.0)
Hemoglobin: 14.6 g/dL (ref 13.0–17.0)
MCH: 36 pg — AB (ref 26.0–34.0)
MCHC: 35.1 g/dL (ref 30.0–36.0)
MCV: 102.5 fL — AB (ref 78.0–100.0)
PLATELETS: 67 10*3/uL — AB (ref 150–400)
RBC: 4.06 MIL/uL — ABNORMAL LOW (ref 4.22–5.81)
RDW: 12.5 % (ref 11.5–15.5)
WBC: 10.8 10*3/uL — ABNORMAL HIGH (ref 4.0–10.5)

## 2016-03-26 LAB — GLUCOSE, CAPILLARY
GLUCOSE-CAPILLARY: 225 mg/dL — AB (ref 65–99)
Glucose-Capillary: 341 mg/dL — ABNORMAL HIGH (ref 65–99)
Glucose-Capillary: 372 mg/dL — ABNORMAL HIGH (ref 65–99)
Glucose-Capillary: 322 mg/dL — ABNORMAL HIGH (ref 65–99)

## 2016-03-26 LAB — HEMOGLOBIN A1C
HEMOGLOBIN A1C: 7.9 % — AB (ref 4.8–5.6)
Mean Plasma Glucose: 180 mg/dL

## 2016-03-26 MED ORDER — INSULIN GLARGINE 100 UNIT/ML ~~LOC~~ SOLN
20.0000 [IU] | Freq: Every day | SUBCUTANEOUS | Status: DC
  Administered 2016-03-26: 20 [IU] via SUBCUTANEOUS
  Filled 2016-03-26 (×2): qty 0.2

## 2016-03-26 MED ORDER — GLUCERNA SHAKE PO LIQD
237.0000 mL | Freq: Two times a day (BID) | ORAL | Status: DC
  Administered 2016-03-27 – 2016-04-09 (×16): 237 mL via ORAL

## 2016-03-26 MED ORDER — INSULIN ASPART 100 UNIT/ML ~~LOC~~ SOLN
5.0000 [IU] | Freq: Three times a day (TID) | SUBCUTANEOUS | Status: DC
  Administered 2016-03-26: 5 [IU] via SUBCUTANEOUS

## 2016-03-26 MED ORDER — MUPIROCIN 2 % EX OINT
1.0000 "application " | TOPICAL_OINTMENT | Freq: Two times a day (BID) | CUTANEOUS | Status: AC
  Administered 2016-03-26 – 2016-03-30 (×10): 1 via NASAL
  Filled 2016-03-26: qty 22

## 2016-03-26 MED ORDER — CHLORHEXIDINE GLUCONATE CLOTH 2 % EX PADS
6.0000 | MEDICATED_PAD | Freq: Every day | CUTANEOUS | Status: AC
  Administered 2016-03-26 – 2016-03-30 (×5): 6 via TOPICAL

## 2016-03-26 MED ORDER — INSULIN ASPART 100 UNIT/ML ~~LOC~~ SOLN
0.0000 [IU] | Freq: Three times a day (TID) | SUBCUTANEOUS | Status: DC
  Administered 2016-03-26: 15 [IU] via SUBCUTANEOUS
  Administered 2016-03-27 (×2): 5 [IU] via SUBCUTANEOUS
  Administered 2016-03-28: 3 [IU] via SUBCUTANEOUS
  Administered 2016-03-28: 8 [IU] via SUBCUTANEOUS
  Administered 2016-03-28: 15 [IU] via SUBCUTANEOUS
  Administered 2016-03-29: 11 [IU] via SUBCUTANEOUS
  Administered 2016-03-29: 2 [IU] via SUBCUTANEOUS
  Administered 2016-03-29: 8 [IU] via SUBCUTANEOUS
  Administered 2016-03-31: 2 [IU] via SUBCUTANEOUS
  Administered 2016-03-31: 0 [IU] via SUBCUTANEOUS
  Administered 2016-04-01 (×2): 3 [IU] via SUBCUTANEOUS
  Administered 2016-04-02 – 2016-04-03 (×5): 5 [IU] via SUBCUTANEOUS
  Administered 2016-04-04: 2 [IU] via SUBCUTANEOUS
  Administered 2016-04-05: 5 [IU] via SUBCUTANEOUS
  Administered 2016-04-05: 8 [IU] via SUBCUTANEOUS
  Administered 2016-04-05: 5 [IU] via SUBCUTANEOUS
  Administered 2016-04-06 – 2016-04-07 (×3): 8 [IU] via SUBCUTANEOUS
  Administered 2016-04-07 – 2016-04-08 (×4): 5 [IU] via SUBCUTANEOUS
  Administered 2016-04-08 – 2016-04-09 (×2): 8 [IU] via SUBCUTANEOUS
  Administered 2016-04-09: 11 [IU] via SUBCUTANEOUS

## 2016-03-26 MED ORDER — INSULIN ASPART 100 UNIT/ML ~~LOC~~ SOLN
0.0000 [IU] | Freq: Every day | SUBCUTANEOUS | Status: DC
  Administered 2016-03-26: 4 [IU] via SUBCUTANEOUS
  Administered 2016-03-27: 2 [IU] via SUBCUTANEOUS
  Administered 2016-03-28: 5 [IU] via SUBCUTANEOUS
  Administered 2016-03-29 – 2016-04-02 (×2): 3 [IU] via SUBCUTANEOUS
  Administered 2016-04-03 – 2016-04-05 (×2): 2 [IU] via SUBCUTANEOUS
  Administered 2016-04-07: 3 [IU] via SUBCUTANEOUS
  Administered 2016-04-08: 2 [IU] via SUBCUTANEOUS

## 2016-03-26 NOTE — Care Management Note (Addendum)
Case Management Note  Patient Details  Name: [name] MRN: [id number] Date of Birth: [birth year]  Subjective/Objective:                 Patient admitted with seizures, hyperglycemia, AKI, possible metastatic lesions to brain on CT. Recently DC'd from [facility] to daughter's house just prior to admission.    Action/Plan:  DC to SNF when medically stable as facilitated by CSW.   Expected Discharge Date:                  Expected Discharge Plan:  Skilled Nursing Facility  In-House Referral:  Clinical Social Work  Discharge planning Services  CM Consult  Post Acute Care Choice:  NA Choice offered to:  NA  DME Arranged:  N/A DME Agency:  NA  HH Arranged:  NA HH Agency:  NA  Status of Service:  Completed, signed off  If discussed at [place] of Stay Meetings, dates discussed:    Additional Comments:  Carles Collet, RN 03/26/2016, 1:48 PM

## 2016-03-26 NOTE — Progress Notes (Signed)
Initial Nutrition Assessment  DOCUMENTATION CODES:   Not applicable  INTERVENTION:   -Continue Glucerna Shake po BID, each supplement provides 220 kcal and 10 grams of protein  NUTRITION DIAGNOSIS:   Predicted suboptimal nutrient intake related to acute illness as evidenced by percent weight loss.  GOAL:   Patient will meet greater than or equal to 90% of their needs  MONITOR:   PO intake, Supplement acceptance, Labs, Weight trends, Skin, I & O's  REASON FOR ASSESSMENT:   Malnutrition Screening Tool    ASSESSMENT:   Patient was discharged from [facility] directly to [facility] for rehab. Patient was discharged from [facility] 9 days ago. At that time he was feeling near his baseline. However, he quickly deteriorated developing generalized weakness which is been constant and progressive.   Pt admitted with generalized weakness.   Per chart review, pt came from home, but was recently discharged from [facility] approximately 1 week ago.  Spoke with pt at bedside, who was very HOH. He shared with this RD that he had fallen, due to having no cartilage in his knee and "it gave out". He confirms that he returned home from [place] recently, "but that didn't work out too well".  Pt reports good appetite and consumed 100% of breakfast tray. Due to hearing impairment, it was difficult for pt to accurately answer all of this RD's questions.   Reviewed wt hx, which reveals a 17# (8.9%) wt loss which is significant for time frame.  Nutrition-Focused physical exam completed. Findings are mild fat depletion, mild muscle depletion, and no edema.   Suspect pt may have some degree of malnutrition, however, unable to identify at this time. RD will continue Glucerna shakes.   Labs reviewed.   Diet Order:  Diet Carb Modified Fluid consistency: Thin; Room service appropriate? Yes  Skin:  Reviewed, no issues  Last BM:  03/25/16  Height:   Ht Readings from Last 1 Encounters:  03/25/16 5'  9" (1.753 m)    Weight:   Wt Readings from Last 1 Encounters:  03/25/16 174 lb 14.4 oz (79.3 kg)    Ideal Body Weight:  72.7 kg  BMI:  Body mass index is 25.83 kg/m.  Estimated Nutritional Needs:   Kcal:  1700-1900  Protein:  85-100 grams  Fluid:  1.7-1.9 L  EDUCATION NEEDS:   No education needs identified at this time  Pistol Kessenich A. Jimmye Norman, RD, LDN, CDE Pager: [phone] After hours Pager: [phone]

## 2016-03-26 NOTE — Progress Notes (Signed)
Pt arrived floor, 5w10 in the company of his daughter around 9pm. Pt settled in the room, will continue to monitor.

## 2016-03-26 NOTE — Progress Notes (Signed)
Inpatient Diabetes Program Recommendations  AACE/ADA: New Consensus Statement on Inpatient Glycemic Control (2015)  Target Ranges:  Prepandial:   less than 140 mg/dL      Peak postprandial:   less than 180 mg/dL (1-2 hours)      Critically ill patients:  140 - 180 mg/dL   Results for [name], [name] (MRN [id number]) as of 03/26/2016 10:32  Ref. Range 03/25/2016 10:27 03/25/2016 18:01 03/25/2016 21:07 03/26/2016 08:01  Glucose-Capillary Latest Ref Range: 65 - 99 mg/dL 574 (HH) 201 (H) 244 (H) 225 (H)   Results for [name], [name] (MRN [id number]) as of 03/26/2016 10:32  Ref. Range 03/02/2016 11:30 03/25/2016 19:03  Hemoglobin A1C Latest Ref Range: 4.8 - 5.6 % 5.5 7.9 (H) NOTE: increase in 3 weeks    Review of Glycemic Control  Diabetes history: DM2, long-term steroid use Outpatient Diabetes medications: Novolog 8-10 units BID (was on OHA's 2 years ago) Current orders for Inpatient glycemic control: Novolog 0-9 units TIDWC, 0-5 units QHS; carb mod diet  Inpatient Diabetes Program Recommendations: Please consider:      Novolog 4 units Meal Coverage TIDAC;       Lantus 10 units QHS.   May need home medications adjusted.  Thank you,  Windy Carina, RN, BSN Diabetes Coordinator Inpatient Diabetes Program [phone] (Team Pager) [phone] ([facility] office) [phone] [facility] office) [phone] [facility] office)

## 2016-03-26 NOTE — Evaluation (Signed)
Occupational Therapy Evaluation Patient Details Name: [name] MRN: [id number] DOB: Aug 21, 1929 Today's Date: 03/26/2016    History of Present Illness Patient is an 82 yo male admitted from SNF with hyperglycemia and weakness. PMHx:  seizures, metastatic brain lesions, delirium, Afib, SDH with craniotomy, pacemaker, DM, transitional cell CA, TIA, HTN, ETOH use, CAD   Clinical Impression   Pt admitted with the above diagnoses and presents with below problem list. Pt will benefit from continued acute OT to address the below listed deficits and maximize independence with basic ADLs prior to d/c to venue below. PTA pt was home for about 9 days since previous SNF stay for rehab. Pt's daughter reports he was initially only needing occasional help with LB ADLs but deteriorated quickly leading to this hospital admission. Pt currently min to mod A with LB ADLs and transfers. Pt presents with generalized weakness impacting ADL assist level. Daughter present for session.    Follow Up Recommendations  SNF    Equipment Recommendations  None recommended by OT    Recommendations for Other Services       Precautions / Restrictions Precautions Precautions: Fall Precaution Comments: seizures, brain mets Restrictions Weight Bearing Restrictions: No Other Position/Activity Restrictions: has significant L knee OA pain      Mobility Bed Mobility               General bed mobility comments: up in chair  Transfers Overall transfer level: Needs assistance Equipment used: Rolling walker (2 wheeled) Transfers: Sit to/from Stand Sit to Stand: Mod assist         General transfer comment: from recliner, assist to power up, extra time and effort    Balance Overall balance assessment: Needs assistance Sitting-balance support: Feet supported Sitting balance-Leahy Scale: Fair     Standing balance support: Bilateral upper extremity supported Standing balance-Leahy Scale:  Fair Standing balance comment: fair static balance, poor dynamic                            ADL Overall ADL's : Needs assistance/impaired Eating/Feeding: Supervision/ safety;Sitting   Grooming: Wash/dry hands;Wash/dry face;Oral care;Brushing hair;Set up;Supervision/safety;Sitting   Upper Body Bathing: Minimal assitance;Sitting   Lower Body Bathing: Moderate assistance;Sit to/from stand   Upper Body Dressing : Minimal assistance;Sitting   Lower Body Dressing: Moderate assistance;Sit to/from stand   Toilet Transfer: Minimal assistance;Moderate assistance;Stand-pivot   Toileting- Clothing Manipulation and Hygiene: Moderate assistance;Sit to/from stand   Tub/ Shower Transfer: Minimal assistance;Moderate assistance;Stand-pivot;3 in 1;Rolling walker     General ADL Comments: Pt stood for about 5 minutes by recliner and simulated walking in place. Min A for balance at times. Mod A to power up from recliner.      Vision     Perception     Praxis      Pertinent Vitals/Pain Pain Assessment: Faces Faces Pain Scale: Hurts even more Pain Location: L knee standing Pain Descriptors / Indicators: Aching;Sore Pain Intervention(s): Limited activity within patient's tolerance;Monitored during session;Repositioned     Hand Dominance Right   Extremity/Trunk Assessment Upper Extremity Assessment Upper Extremity Assessment: Generalized weakness   Lower Extremity Assessment Lower Extremity Assessment: Defer to PT evaluation   Cervical / Trunk Assessment Cervical / Trunk Assessment: Kyphotic   Communication Communication Communication: HOH (speak into L ear)   Cognition Arousal/Alertness: Awake/alert Behavior During Therapy: WFL for tasks assessed/performed Overall Cognitive Status: History of cognitive impairments - at baseline Area of Impairment: Memory;Safety/judgement;Awareness;Problem solving Orientation  Level: Situation Current Attention Level:  Sustained Memory: Decreased recall of precautions;Decreased short-term memory Following Commands: Follows one step commands with increased time Safety/Judgement: Decreased awareness of safety;Decreased awareness of deficits Awareness: Intellectual Problem Solving: Slow processing;Requires verbal cues;Decreased initiation     General Comments       Exercises       Shoulder Instructions      Home Living Family/patient expects to be discharged to:: Skilled nursing facility Living Arrangements: Children Available Help at Discharge: Family Type of Home: House Home Access: Stairs to enter [employer] of Steps: 2 Entrance Stairs-Rails: Right Home Layout: One level     Bathroom Shower/Tub: Tub/shower unit Shower/tub characteristics: Curtain Biochemist, clinical: Standard Bathroom Accessibility: Yes   Home Equipment: Environmental consultant - 2 wheels;Cane - single point;Grab bars - tub/shower;Shower seat;Adaptive equipment Adaptive Equipment: Sock aid        Prior Functioning/Environment Level of Independence: Needs assistance  Gait / Transfers Assistance Needed: used mainly RW at SNF ADL's / Homemaking Assistance Needed: home for about 9 days since SNF discharge. Pt's daughter reports he was initially only needing occasional help with LB ADLs but deteriorated quickly leading to this hospital admission.             OT Problem List: Decreased strength;Decreased activity tolerance;Impaired balance (sitting and/or standing);Decreased cognition;Decreased safety awareness;Decreased knowledge of use of DME or AE   OT Treatment/Interventions: Self-care/ADL training;Therapeutic exercise;Energy conservation;DME and/or AE instruction;Therapeutic activities;Cognitive remediation/compensation;Patient/family education;Balance training    OT Goals(Current goals can be found in the care plan section) Acute Rehab OT Goals Patient Stated Goal: states he wants to try to walk OT Goal Formulation: With  patient Time For Goal Achievement: 04/09/16 Potential to Achieve Goals: Good ADL Goals Pt Will Perform Upper Body Bathing: with supervision;sitting Pt Will Perform Lower Body Bathing: with min guard assist;sit to/from stand Pt Will Perform Upper Body Dressing: with supervision;sitting;with set-up Pt Will Perform Lower Body Dressing: with min guard assist;sit to/from stand Pt Will Transfer to Toilet: with min guard assist;ambulating Pt Will Perform Toileting - Clothing Manipulation and hygiene: with min guard assist;sit to/from stand Pt Will Perform Tub/Shower Transfer: with min guard assist;ambulating;3 in 1;rolling walker  OT Frequency: Min 2X/week   Barriers to D/C:            Co-evaluation              End of Session Equipment Utilized During Treatment: Gait belt;Rolling walker  Activity Tolerance: Patient tolerated treatment well Patient left: in chair;with call bell/phone within reach;with chair alarm set;with family/visitor present   Time: [id number] OT Time Calculation (min): 21 min Charges:  OT General Charges $OT Visit: 1 Procedure OT Evaluation $OT Eval Low Complexity: 1 Procedure G-Codes: OT G-codes **NOT FOR INPATIENT CLASS** Functional Assessment Tool Used: clinical judgement Functional Limitation: Self care Self Care Current Status [id number]): At least 20 percent but less than 40 percent impaired, limited or restricted Self Care Goal Status [id number]): At least 1 percent but less than 20 percent impaired, limited or restricted  Hortencia Pilar 03/26/2016, 3:02 PM

## 2016-03-26 NOTE — Evaluation (Signed)
Physical Therapy Evaluation Patient Details Name: [name] MRN: [id number] DOB: 06-30-1929 Today's Date: 03/26/2016   History of Present Illness  Patient is an 82 yo male admitted from SNF with hyperglycemia and weakness. PMHx:  seizures, metastatic brain lesions, delirium, Afib, SDH with craniotomy, pacemaker, DM, transitional cell CA, TIA, HTN, ETOH use, CAD  Clinical Impression  Pt is up to walk with PT and demonstrates signficant losses of control of balance with RW needed, mainly due to pain on L knee and generalized weakness.  He will return to SNF and continue rehab with family expected to get him home afterward.  Follow acutely to increase comfort of standing and to limit the length of SNF stay after this rehospitalization.    Follow Up Recommendations SNF    Equipment Recommendations  None recommended by PT (will be able to get assistance with this at SNF, returning )    Recommendations for Other Services Rehab consult     Precautions / Restrictions Precautions Precautions: Fall Precaution Comments: seizures, brain mets Restrictions Weight Bearing Restrictions: No Other Position/Activity Restrictions: has significant L knee OA pain      Mobility  Bed Mobility Overal bed mobility: Needs Assistance Bed Mobility: Supine to Sit Rolling: Mod assist Sidelying to sit: Mod assist       General bed mobility comments: cues and assist with trunk, assisted him to scoot with bed pad  Transfers Overall transfer level: Needs assistance Equipment used: Rolling walker (2 wheeled) Transfers: Sit to/from [facility] Sit to Stand: Mod assist Stand pivot transfers: Min assist       General transfer comment: power up help with cued hand placement and then verbally cued for transition to chair  Ambulation/Gait Ambulation/Gait assistance: Min assist Ambulation Distance (Feet): 3 Feet Assistive device: Rolling walker (2 wheeled) Gait Pattern/deviations:  Step-to pattern;Trunk flexed;Wide base of support;Shuffle Gait velocity: reduced Gait velocity interpretation: Below normal speed for age/gender General Gait Details: reminders to stand up fully and to transition to chair inside walker  Stairs            Wheelchair Mobility    Modified Rankin (Stroke Patients Only)       Balance   Sitting-balance support: Feet supported Sitting balance-Leahy Scale: Fair     Standing balance support: Bilateral upper extremity supported Standing balance-Leahy Scale: Fair Standing balance comment: less than fair balance dynamically                             Pertinent Vitals/Pain Pain Assessment: Faces Faces Pain Scale: Hurts even more Pain Location: L knee with initial standing and gait Pain Descriptors / Indicators: Aching;Sore Pain Intervention(s): Limited activity within patient's tolerance;Monitored during session;Repositioned    Home Living Family/patient expects to be discharged to:: Skilled nursing facility Living Arrangements: Children Available Help at Discharge: Family Type of Home: House Home Access: Stairs to enter Entrance Stairs-Rails: Right Entrance Stairs-Number of Steps: 2 Home Layout: One level Home Equipment: Walker - 2 wheels;Cane - single point;Grab bars - tub/shower;Shower seat;Adaptive equipment      Prior Function Level of Independence: Needs assistance   Gait / Transfers Assistance Needed: used mainly RW at SNF  ADL's / Homemaking Assistance Needed: has been in SNF        Hand Dominance   Dominant Hand: Right    Extremity/Trunk Assessment   Upper Extremity Assessment: Generalized weakness           Lower Extremity  Assessment: Generalized weakness      Cervical / Trunk Assessment: Kyphotic  Communication   Communication: HOH (speak into L ear)  Cognition Arousal/Alertness: Awake/alert Behavior During Therapy: WFL for tasks assessed/performed Overall Cognitive Status:  History of cognitive impairments - at baseline Area of Impairment: Memory;Safety/judgement;Awareness;Problem solving Orientation Level: Situation Current Attention Level: Sustained Memory: Decreased recall of precautions;Decreased short-term memory Following Commands: Follows one step commands with increased time Safety/Judgement: Decreased awareness of safety;Decreased awareness of deficits Awareness: Intellectual Problem Solving: Slow processing;Requires verbal cues;Decreased initiation      General Comments      Exercises     Assessment/Plan    PT Assessment Patient needs continued PT services  PT Problem List Decreased strength;Decreased range of motion;Decreased activity tolerance;Decreased balance;Decreased mobility;Decreased coordination;Decreased cognition;Decreased knowledge of use of DME;Decreased safety awareness;Cardiopulmonary status limiting activity          PT Treatment Interventions DME instruction;Gait training;Stair training;Functional mobility training;Therapeutic activities;Therapeutic exercise;Balance training;Neuromuscular re-education;Patient/family education    PT Goals (Current goals can be found in the Care Plan section)  Acute Rehab PT Goals Patient Stated Goal: states he wants to try to walk PT Goal Formulation: With patient Time For Goal Achievement: 04/09/16 Potential to Achieve Goals: Good    Frequency Min 3X/week   Barriers to discharge  (2 person assist needed for gait)      Co-evaluation               End of Session Equipment Utilized During Treatment: Gait belt Activity Tolerance: Patient tolerated treatment well Patient left: in chair;with call bell/phone within reach;with chair alarm set Nurse Communication: Mobility status    Functional Assessment Tool Used: clinical judgment Functional Limitation: Mobility: Walking and moving around Mobility: Walking and Moving Around Current Status [phone]): At least 40 percent but less  than 60 percent impaired, limited or restricted Mobility: Walking and Moving Around Goal Status [phone]): At least 1 percent but less than 20 percent impaired, limited or restricted    Time: 1002-1027 PT Time Calculation (min) (ACUTE ONLY): 25 min   Charges:   PT Evaluation $PT Eval Moderate Complexity: 1 Procedure PT Treatments $Therapeutic Activity: 8-22 mins   PT G Codes:   PT G-Codes **NOT FOR INPATIENT CLASS** Functional Assessment Tool Used: clinical judgment Functional Limitation: Mobility: Walking and moving around Mobility: Walking and Moving Around Current Status [id number]): At least 40 percent but less than 60 percent impaired, limited or restricted Mobility: Walking and Moving Around Goal Status [phone]): At least 1 percent but less than 20 percent impaired, limited or restricted    Ramond Dial 03/26/2016, 11:51 AM    Mee Hives, PT MS Acute Rehab Dept. Number: [place] [facility] [place]

## 2016-03-26 NOTE — Progress Notes (Signed)
New head CT reviewed and chart reviewed.  CT continues to show intracranial abnormalities potentially representing cerebrovascular events.  Metastatic disease to the brain is also in the differential diagnosis, but, the study is not definitive for that.  Recommend continued surveillance.  If follow-up brain imaging suggests brain metastases, there may be a role to consider radiation treatment.  At present, I don't see any role for radiation therapy.  ------------------------------------------------   Tyler Pita, MD [place] Director and Director of Stereotactic Radiosurgery Direct Dial: [phone]  Fax: [phone] [email or web address]  Skype  LinkedIn

## 2016-03-26 NOTE — Progress Notes (Signed)
IP PROGRESS NOTE  Subjective:   Events noted in the last few days. Patient hospitalized with generalized weakness and hypoglycemia. CT scan of the brain obtained on 03/25/2016 showed no acute bleeding or hemorrhage and a previous right frontal lesion have subsided. A previous lesion noted to be stable and a new 1 developed. Clinically he ports weakness in his left arm and overall generalized weakness. He denied any seizure activity.  Objective:  Vital signs in last 24 hours: Temp:  [97.4 F (36.3 C)-97.8 F (36.6 C)] 97.8 F (36.6 C) (10/05 1355) Pulse Rate:  [60-75] 63 (10/05 1355) Resp:  [13-19] 19 (10/05 1355) BP: (114-135)/(62-77) 123/62 (10/05 1355) SpO2:  [97 %-100 %] 100 % (10/05 1355) Weight:  [174 lb 14.4 oz (79.3 kg)] 174 lb 14.4 oz (79.3 kg) (10/04 2036) Weight change:  Last BM Date: 03/25/16  Intake/Output from previous day: 10/04 0701 - 10/05 0700 In: 2756.7 [I.V.:1146.7; IV Piggyback:1610] Out: 800 [Urine:800] Alert, awake gentleman appeared without distress. Mouth: mucous membranes moist, pharynx normal without lesions no thrush noted. Resp: clear to auscultation bilaterally Cardio: regular rate and rhythm, S1, S2 normal, no murmur, click, rub or gallop GI: soft, non-tender; bowel sounds normal; no masses,  no organomegaly Extremities: extremities normal, atraumatic, no cyanosis or edema Skin: Ecchymosis noted on his upper extremities. Neurological: No focal deficits noted.   Lab Results:  Recent Labs  03/25/16 1027 03/26/16 0634  WBC 12.5* 10.8*  HGB 15.5 14.6  HCT 42.5 41.6  PLT 85* 67*    BMET  Recent Labs  03/25/16 1903 03/26/16 0634  NA 130* 132*  K 4.9 5.2*  CL 94* 92*  CO2 26 28  GLUCOSE 248* 230*  BUN 59* 42*  CREATININE 1.39* 1.13  CALCIUM 9.0 8.7*     Medications: I have reviewed the patient's current medications.  Assessment/Plan:  82 year old gentleman with the following issues:  1. 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 CT scan in January 2017 showed pelvic adenopathy indicating metastatic disease.  He is status post salvage chemotherapy utilizing carboplatin and gemcitabine with his last CT scan in July 2017 showed no gross evidence of disease. CT scan Obtained in September 2017 showed no evidence of metastatic disease.  2. Seizure: Patient hospitalized on 02/28/2016 and CT scan of the head with contrast showed 2 masses in the left occipital lobe and the right frontal lobe with vasogenic edema suspicious for metastatic disease.  Repeat CT scan on 03/25/2016 was personally reviewed. It appeared to show a new lesion at the right frontal lobe but the previous right frontal lobe hyperdense area is no longer appreciated. His left occipital focus of uncertain enhancement appears to be stable. These findings do not suggest malignancy and definitive treatment with radiation therapy is not recommended.  These imaging studies were also reviewed by Dr. Tammi Klippel who agrees with these assessments. Repeat imaging studies as recommended per Dr. Tammi Klippel.  These findings were discussed with the patient and his daughter today.   Other possibilities include subacute bleeding or thrombosis should be considered. I recommend neurology input regarding the nature of these lesions given the possibility of malignancy is less likely.      LOS: 0 days   [id number] 03/26/2016, 5:05 PM

## 2016-03-26 NOTE — Progress Notes (Addendum)
PROGRESS NOTE    [name]  [id number] DOB: 01-10-1930 DOA: 03/25/2016 PCP: [name], MD Brief Narrative:[name] is an 82 y.o. male with H/o atrial fibrillation not on anticoagulation, pacemaker- for sinus node dysfunction, mild to mod AS, subdural hematoma, diet controlled diabetes, transitional cell carcinoma with intermittent hematuria, s/p chemotherapy, TIA, HTN was discharged from [facility] 9/13 to [facility] for [facility] after long hospitalization for new onset seizures and possible brain metastasis noted on CT head, he was seen by Neurology, NEurosurgery and Oncology, couldn't have an MRI due to pacer. Finally plan was for FU with Dr.Shadad in Nov with plan for repeat CT with SRS protocol. Patient was discharged from [facility] 9 days ago. At that time he was feeling near his baseline. However, he quickly deteriorated developing generalized weakness which is been constant and progressive. Denies any focal complaint such as seizure, chest pain, shortness breath, palpitations, nausea, vomiting, abdominal pain, dysuria, frequency, fevers. Per daughter at bedside patient has been immobile and not taking part in his exercise regime since he has come home from [facility].  In ER noted to have AKI, creatinine of 1.9 from baseline of 1, NA was 126, K was 6.2 CT head with Probable new focus of enhancement within the right paramedian frontal lobe,previously identified by right frontal parafalcine hyperdense focus is no longer appreciated, possibly having represented an area of hemorrhage, Left occipital hyperdense focus with uncertain enhancement.  Assessment & Plan:  Generalized weakness: Likely multifactorial including dehydration, metabolic derangement, polypharmacy -Depakote, progressive metastatic disease. - Pt/OT - anticipate discharge back to [facility] of other SNF/Rehab - Depakote level is 46, since he is elderly, will not change dose - continue IVF today  AKI with  hyperkalemia and hyponatremia -continue IVF  Diabetes: likely steroid induced.  - H/o Pre-DM as last A1c 5.5 - add lantus and meal coverage SSI  Seizures: Started  last admission. Found to have possible metastatic R frontal and 52mm L occipital lobe brain lesion. Pt followed by Neurosurgery, NEurology, and Oncology.  -unable to have MRI due to pacer -Per review of DC note :""Abnormal Brain CT. The patient's case was presented at multidisciplinary brain oncology conference  including neuroradiology, three neurosurgeons, and two radiation oncologists. In reviewing the imaging (limited by the fact we cannot recommend MRI), the patient's anomalies enchance on non-contrasted studies, though not on the contrasted images. At the conclusion of the discussion, recommendations were made to repeat a CT with and without contrast with SRS protocol in 2 months. Brain navigator will coordinate this with the patient and family, and Dr. Alen Blew will continue to follow him for his history of bladder cancer." -Pt was started on Decadron and Depakote. - continue Depakote - Continue Decadron - Neuro following, D/w Dr.Shadad, he will discuss with Rad Onc Dr.Manning  Recent hydronephrosis due to possible transitional cell carcinoma of R ureter: noted during previous admission - Renal US with mild hydronephrosis only -creatinine improved  HTN: - continue norvasc  H/o Stroke/CAD: - continue plavix,   GERD: - continue ppi   ETOH abuse: - No further ETOH since previous admission  ANxiety: - continue xanax prn  DVT prophylaxis:Hold Heparin SQ due to thrombocytopenia Code Status:Full Code Family Communication:No family at bedside, d/w daughter Disposition Plan:will likely need SNF     Subjective: Feels ok, just " Blah" and weak overall  Objective: Vitals:   03/25/16 1935 03/25/16 2002 03/25/16 2036 03/26/16 0629  BP:  114/63 135/62 114/67  Pulse:  60  63 66  Resp:  13 18 18   Temp:  97.4  F (36.3 C) 97.4 F (36.3 C) 97.5 F (36.4 C)  TempSrc:  Oral    SpO2: 98% 100% 99% 97%  Weight:   79.3 kg (174 lb 14.4 oz)   Height:   5\' 9"  (1.753 m)     Intake/Output Summary (Last 24 hours) at 03/26/16 1100 Last data filed at 03/26/16 0542  Gross per 24 hour  Intake          2756.67 ml  Output              800 ml  Net          1956.67 ml   Filed Weights   03/25/16 1022 03/25/16 2036  Weight: 86.6 kg (191 lb) 79.3 kg (174 lb 14.4 oz)    Examination:  General exam: Appears calm and comfortable  Respiratory system: Clear to auscultation. Respiratory effort normal. Cardiovascular system: S1 & S2 heard, RRR. No JVD, murmurs, rubs, gallops or clicks. No pedal edema. Gastrointestinal system: Abdomen is nondistended, soft and nontender. No organomegaly or masses felt. Normal bowel sounds heard. Central nervous system: Alert and oriented. No focal neurological deficits. Extremities: Symmetric 5 x 5 power. Skin: No rashes, lesions or ulcers Psychiatry: Judgement and insight appear normal. Mood & affect appropriate.     Data Reviewed: I have personally reviewed following labs and imaging studies  CBC:  Recent Labs Lab 03/25/16 1027 03/26/16 0634  WBC 12.5* 10.8*  NEUTROABS 11.7*  --   HGB 15.5 14.6  HCT 42.5 41.6  MCV 101.2* 102.5*  PLT 85* 67*   Basic Metabolic Panel:  Recent Labs Lab 03/25/16 1027 03/25/16 1156 03/25/16 1903 03/26/16 0634  NA 126* 128* 130* 132*  K 6.2* 5.6* 4.9 5.2*  CL 93* 93* 94* 92*  CO2 19* 24 26 28   GLUCOSE 546* 500* 248* 230*  BUN 77* 74* 59* 42*  CREATININE 1.94* 1.76* 1.39* 1.13  CALCIUM 8.8* 8.9 9.0 8.7*   GFR: Estimated Creatinine Clearance: 48.7 mL/min (by C-G formula based on SCr of 1.13 mg/dL). Liver Function Tests: No results for input(s): AST, ALT, ALKPHOS, BILITOT, PROT, ALBUMIN in the last 168 hours. No results for input(s): LIPASE, AMYLASE in the last 168 hours. No results for input(s): AMMONIA in the last 168  hours. Coagulation Profile: No results for input(s): INR, PROTIME in the last 168 hours. Cardiac Enzymes: No results for input(s): CKTOTAL, CKMB, CKMBINDEX, TROPONINI in the last 168 hours. BNP (last 3 results) No results for input(s): PROBNP in the last 8760 hours. HbA1C:  Recent Labs  03/25/16 1903  HGBA1C 7.9*   CBG:  Recent Labs Lab 03/25/16 1027 03/25/16 1801 03/25/16 2107 03/26/16 0801  GLUCAP 574* 201* 244* 225*   Lipid Profile: No results for input(s): CHOL, HDL, LDLCALC, TRIG, CHOLHDL, LDLDIRECT in the last 72 hours. Thyroid Function Tests: No results for input(s): TSH, T4TOTAL, FREET4, T3FREE, THYROIDAB in the last 72 hours. Anemia Panel: No results for input(s): VITAMINB12, FOLATE, FERRITIN, TIBC, IRON, RETICCTPCT in the last 72 hours. Urine analysis:    Component Value Date/Time   COLORURINE YELLOW 03/25/2016 1200   APPEARANCEUR CLEAR 03/25/2016 1200   LABSPEC 1.028 03/25/2016 1200   PHURINE 5.5 03/25/2016 1200   GLUCOSEU >1000 (A) 03/25/2016 1200   HGBUR SMALL (A) 03/25/2016 1200   BILIRUBINUR NEGATIVE 03/25/2016 1200   KETONESUR NEGATIVE 03/25/2016 1200   PROTEINUR NEGATIVE 03/25/2016 1200   UROBILINOGEN 1.0 04/03/2012 1018  NITRITE NEGATIVE 03/25/2016 1200   LEUKOCYTESUR NEGATIVE 03/25/2016 1200   Sepsis Labs: @LABRCNTIP (procalcitonin:4,lacticidven:4)  )No results found for this or any previous visit (from the past 240 hour(s)).       Radiology Studies: Ct Head W & Wo Contrast  Result Date: 03/25/2016 CLINICAL DATA:  82 y/o  M; weakness. EXAM: CT HEAD WITHOUT AND WITH CONTRAST TECHNIQUE: Contiguous axial images were obtained from the base of the skull through the vertex without and with intravenous contrast CONTRAST:  61mL ISOVUE-300 IOPAMIDOL (ISOVUE-300) INJECTION 61%<Contrast>9mL ISOVUE-300 IOPAMIDOL (ISOVUE-300) INJECTION 61% COMPARISON:  CT of head with contrast dated 02/28/2016 FINDINGS: Brain: Stable left inferior frontal  encephalomalacia. Stable hyperdense focus in the left occipital lobe measuring 10 mm (series 2, image 14). Postcontrast administration the lesion appears to enhance only on the coronal reconstruction (compare series 8, image 33 with series 6, image 54). Additionally, there is a probable new focus of enhancement in the right paramedian frontal lobe (series 4, image 15) No evidence for large territory infarct or new intracranial hemorrhage. Stable mild to moderate chronic microvascular ischemic changes and parenchymal volume loss. Vascular: Calcific atherosclerosis of carotid siphons. Skull: Postsurgical changes related to a left frontal craniotomy are stable. All Sinuses/Orbits: Right mastoid opacification with sclerosis compatible with sequelae of chronic otomastoiditis, stable. Mild paranasal sinus disease with sphenoid and ethmoid patchy opacification. Left mastoid air cells are clear. Orbits are unremarkable. Other: Right parotidectomy. IMPRESSION: 1. No evidence of large territory infarct or new intracranial hemorrhage. 2. Probable new focus of enhancement within the right paramedian frontal lobe. Differential includes metastatic disease or subacute infarction. 3. Previous identified by right frontal parafalcine hyperdense focus is no longer appreciated, possibly having represented an area of hemorrhage. 4. Left occipital hyperdense focus with uncertain enhancement. Stable in size in comparison with prior studies. Electronically Signed   By: Kristine Garbe M.D.   On: 03/25/2016 17:29   US Renal  Result Date: 03/25/2016 CLINICAL DATA:  Hydronephrosis due to obstruction of the ureter. Patient with history of urothelial carcinoma treated with chemotherapy most recently November 27, 2015. Noted to have mild right hydroureteronephrosis to the level of the proximal right ureter on prior CT of 03/01/2016. EXAM: RENAL / URINARY TRACT ULTRASOUND COMPLETE COMPARISON:  CT 03/01/2016 FINDINGS: Right Kidney: Length:  9.7 cm. Echogenicity within normal limits. No mass or hydronephrosis visualized. Cortical - medullary distinction is maintained. There is fullness of the right renal collecting system likely representing mild hydronephrosis (images 16 and 18). A hydroureter however was not visualized. Left Kidney: Length: 10 cm. Echogenicity within normal limits. No mass or hydronephrosis visualized. Bladder: Physiologically distended. Only a left-sided ureteral jet could be confirmed despite several minutes of imaging per discussion with ultrasound technologist. IMPRESSION: Mild right-sided hydronephrosis. Absent right ureteral jet suggests continued obstruction. Patent left ureter. Electronically Signed   By: Ashley Royalty M.D.   On: 03/25/2016 17:51        Scheduled Meds: . amLODipine  10 mg Oral Daily  . Chlorhexidine Gluconate Cloth  6 each Topical Q0600  . clopidogrel  75 mg Oral Daily  . dexamethasone  4 mg Oral Daily  . divalproex  500 mg Oral Q12H  . feeding supplement (ENSURE ENLIVE)  237 mL Oral BID BM  . heparin  5,000 Units Subcutaneous Q8H  . insulin aspart  0-5 Units Subcutaneous QHS  . insulin aspart  0-9 Units Subcutaneous TID WC  . mupirocin ointment  1 application Nasal BID  . pantoprazole  40 mg Oral  Daily  . sodium chloride flush  3 mL Intravenous Q12H   Continuous Infusions: . sodium chloride 100 mL/hr at 03/26/16 0052     LOS: 0 days    Time spent: 69min    Domenic Polite, MD [facility] Pager [phone]  If 7PM-7AM, please contact night-coverage [email or web address] Password [email or web address] 03/26/2016, 11:00 AM

## 2016-03-26 NOTE — Clinical Social Work Note (Signed)
Clinical Social Work Assessment  Patient Details  Name: [name] MRN: [id number] Date of Birth: Apr 12, 1930  Date of referral:  03/26/16               Reason for consult:  Facility Placement                Permission sought to share information with:  Facility Sport and exercise psychologist, Family Supports Permission granted to share information::  Yes, Verbal Permission Granted  Name::     Electrical engineer::  SNFs  Relationship::  Dtr  Contact Information:     Housing/Transportation Living arrangements for the past 2 months:  [place] of Information:  Patient Patient Interpreter Needed:  None Criminal Activity/Legal Involvement Pertinent to Current Situation/Hospitalization:  No - Comment as needed Significant Relationships:  Adult Children Lives with:  Self Do you feel safe going back to the place where you live?  No Need for family participation in patient care:  Yes (Comment)  Care giving concerns:  CSW received consult for possible SNF placement at time of discharge. CSW met with patient and patient's daughter at bedside regarding PT recommendation of SNF placement at time of discharge. Patient was quiet and would not say anything to CSW.Patient's daughter was tearful. Per patient's daughter, patient lives alone but was recently at [facility] for rehab. Patient only used 13 Medicare days. Patient is currently unable to care for himself at home given patient's current physical needs and fall risk. Patient's daughter expressed understanding of PT recommendation and is agreeable to SNF placement at time of discharge. CSW to continue to follow and assist with discharge planning needs.   Social Worker assessment / plan:  CSW spoke with patient and patient's daughter concerning possibility of rehab at [facility] before returning home.  Employment status:  Retired Forensic scientist:  Medicare PT Recommendations:  [place]  / Referral to community resources:  [place]  Patient/Family's Response to care:  Patient and patient's daughter recognize need for rehab before returning home and are agreeable to a SNF in [place]. Patient's daughter reported preference for [place].  Patient/Family's Understanding of and Emotional Response to Diagnosis, Current Treatment, and Prognosis:  Patient/family is realistic regarding therapy needs and expressed being hopeful for SNF placement. Patient's daughter expressed understanding of CSW role and discharge process. No questions/concerns about plan or treatment.    Emotional Assessment Appearance:  Appears stated age Attitude/Demeanor/Rapport:  Unresponsive (Pt would not say anything-daughter spoke w/ CSW) Affect (typically observed):  Unable to Assess (Patient wouldn't speak) Orientation:  Oriented to Self, Oriented to Place, Oriented to  Time, Oriented to Situation Alcohol / Substance use:  Not Applicable Psych involvement (Current and /or in the community):  No (Comment)  Discharge Needs  Concerns to be addressed:  Care Coordination Readmission within the last 30 days:  Yes Current discharge risk:  Physical Impairment Barriers to Discharge:  Continued Medical Work up   [employer], [place] 03/26/2016, 12:27 PM

## 2016-03-26 NOTE — NC FL2 (Signed)
[place] LEVEL OF CARE SCREENING TOOL     IDENTIFICATION  Patient Name: [name] Birthdate: 04/23/1930 Sex: male Admission Date (Current Location): 03/25/2016  [facility] and [place] Number:  Herbalist and Address:  [facility] [address], [place], [place] [address]      Provider Number: [id number]  Attending Physician Name and Address:  Domenic Polite, MD  Relative Name and Phone Number:  [name], daughter, [phone]    Current Level of Care: Hospital Recommended Level of Care: [place] Prior Approval Number:    Date Approved/Denied:   PASRR Number: [id number]  Discharge Plan: SNF    Current Diagnoses: Patient Active Problem List   Diagnosis Date Noted  . Hyperglycemia 03/25/2016  . Left frontal lobe lesion 03/25/2016  . AKI (acute kidney injury) ([place]) 03/25/2016  . Hyperkalemia 03/25/2016  . Hydronephrosis 03/25/2016  . Dehydration   . Hydronephrosis due to obstruction of ureter   . Elevated troponin   . Seizure ([place])   . Abnormal brain CT 03/03/2016  . Hypokalemia   . Seizures ([place]) 02/29/2016  . Syncope 02/28/2016  . Port catheter in place 02/21/2016  . Neutropenic fever ([place])   . Malnutrition of moderate degree 08/28/2015  . Diet-controlled diabetes mellitus ([place])   . Coronary artery disease 08/27/2015  . Thrombocytopenia ([place]) 08/27/2015  . Hematuria 08/27/2015  . Pancytopenia ([place]) 08/27/2015  . Sepsis ([place])   . 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 Chest pain 07/17/2014  . Aortic stenosis 04/29/2013  . Thrombocytosis ([place]) 03/30/2013  . Abnormal urine cytology 05/02/2012  . Acute pulmonary edema ([place]) 10/04/2011  . Alcohol withdrawal delirium ([place]) 10/04/2011  . TIA (transient ischemic attack) 10/03/2011  . Subdural hematoma ([place]) 10/02/2011  . ETOH abuse 10/02/2011  . Permanent atrial fibrillation ([place]) 10/02/2011  . UTI (lower urinary tract infection) 10/02/2011   . Elevated PSA 05/19/2011  . Pacemaker 05/19/2011  . Diabetes mellitus ([place]) 05/19/2011  . Hypertension 05/19/2011  . Dyslipidemia 05/19/2011    Orientation RESPIRATION BLADDER Height & Weight     Self, Time, Situation, Place  Normal Continent, External catheter (Condom catheter) Weight: 79.3 kg (174 lb 14.4 oz) Height:  5\' 9"  (175.3 cm)  BEHAVIORAL SYMPTOMS/MOOD NEUROLOGICAL BOWEL NUTRITION STATUS      Continent Diet (Please see DC Summary)  AMBULATORY STATUS COMMUNICATION OF NEEDS Skin   Extensive Assist Verbally Normal                       Personal Care Assistance Level of Assistance  Bathing, Dressing Bathing Assistance: Maximum assistance   Dressing Assistance: Maximum assistance     Functional Limitations Info  Hearing   Hearing Info: Impaired      SPECIAL CARE FACTORS FREQUENCY  PT (By licensed PT)     PT Frequency: 5x/week              Contractures      Additional Factors Info  Code Status, Allergies, Isolation Precautions, Insulin Sliding Scale Code Status Info: Full Allergies Info: Codeine, Docetaxel   Insulin Sliding Scale Info: insulin aspart (novoLOG) injection 0-5 Units;insulin aspart (novoLOG) injection 0-9 Units; Isolation Precautions Info: MRSA     Current Medications (03/26/2016):  This is the current hospital active medication list Current Facility-Administered Medications  Medication Dose Route Frequency Provider Last Rate Last Dose  . 0.9 %  sodium chloride infusion   Intravenous Continuous Waldemar Dickens, MD 100  mL/hr at 03/26/16 1106    . ALPRAZolam Duanne Moron) tablet 0.5 mg  0.5 mg Oral QHS PRN Waldemar Dickens, MD      . amLODipine (NORVASC) tablet 10 mg  10 mg Oral Daily Waldemar Dickens, MD   10 mg at 03/26/16 0911  . Chlorhexidine Gluconate Cloth 2 % PADS 6 each  6 each Topical Q0600 Doreatha Lew, MD   6 each at 03/26/16 [phone]  . clopidogrel (PLAVIX) tablet 75 mg  75 mg Oral Daily Waldemar Dickens, MD   75 mg at 03/26/16  0911  . dexamethasone (DECADRON) tablet 4 mg  4 mg Oral Daily Waldemar Dickens, MD   4 mg at 03/26/16 0911  . divalproex (DEPAKOTE) DR tablet 500 mg  500 mg Oral Q12H Waldemar Dickens, MD   500 mg at 03/26/16 0911  . feeding supplement (ENSURE ENLIVE) (ENSURE ENLIVE) liquid 237 mL  237 mL Oral BID BM Doreatha Lew, MD   237 mL at 03/26/16 0912  . insulin aspart (novoLOG) injection 0-5 Units  0-5 Units Subcutaneous QHS Waldemar Dickens, MD   2 Units at 03/25/16 2243  . insulin aspart (novoLOG) injection 0-9 Units  0-9 Units Subcutaneous TID WC Waldemar Dickens, MD   3 Units at 03/26/16 0911  . mupirocin ointment (BACTROBAN) 2 % 1 application  1 application Nasal BID Doreatha Lew, MD   1 application at [phone]  . ondansetron (ZOFRAN) tablet 4 mg  4 mg Oral Q6H PRN Waldemar Dickens, MD       Or  . ondansetron [facility]) injection 4 mg  4 mg Intravenous Q6H PRN Waldemar Dickens, MD      . oxyCODONE (Oxy IR/ROXICODONE) immediate release tablet 5 mg  5 mg Oral Q4H PRN Waldemar Dickens, MD      . pantoprazole (PROTONIX) EC tablet 40 mg  40 mg Oral Daily Waldemar Dickens, MD   40 mg at 03/26/16 0911  . polyethylene glycol (MIRALAX / GLYCOLAX) packet 17 g  17 g Oral Daily PRN Waldemar Dickens, MD      . prochlorperazine (COMPAZINE) tablet 10 mg  10 mg Oral Q6H PRN Waldemar Dickens, MD      . sodium chloride flush (NS) 0.9 % injection 3 mL  3 mL Intravenous Q12H Waldemar Dickens, MD   3 mL at 03/26/16 0912     Discharge Medications: Please see discharge summary for a list of discharge medications.  Relevant Imaging Results:  Relevant Lab Results:   Additional Information SS#: [id number], not currently receiving cancer treatment unclear if pt will be restarting in future  [employer], LCSWA

## 2016-03-27 DIAGNOSIS — D696 Thrombocytopenia, unspecified: Secondary | ICD-10-CM

## 2016-03-27 LAB — BASIC METABOLIC PANEL
Anion gap: 8 (ref 5–15)
BUN: 30 mg/dL — AB (ref 6–20)
CALCIUM: 8.6 mg/dL — AB (ref 8.9–10.3)
CO2: 29 mmol/L (ref 22–32)
CREATININE: 0.9 mg/dL (ref 0.61–1.24)
Chloride: 97 mmol/L — ABNORMAL LOW (ref 101–111)
GFR calc Af Amer: >60 mL/min (ref >60)
GLUCOSE: 48 mg/dL — AB (ref 65–99)
POTASSIUM: 4.7 mmol/L (ref 3.5–5.1)
Sodium: 134 mmol/L — ABNORMAL LOW (ref 135–145)

## 2016-03-27 LAB — CBC
HEMATOCRIT: 39.1 % (ref 39.0–52.0)
Hemoglobin: 13.8 g/dL (ref 13.0–17.0)
MCH: 36.1 pg — ABNORMAL HIGH (ref 26.0–34.0)
MCHC: 35.3 g/dL (ref 30.0–36.0)
MCV: 102.4 fL — AB (ref 78.0–100.0)
PLATELETS: 53 10*3/uL — AB (ref 150–400)
RBC: 3.82 MIL/uL — AB (ref 4.22–5.81)
RDW: 12.2 % (ref 11.5–15.5)
WBC: 9.6 10*3/uL (ref 4.0–10.5)

## 2016-03-27 LAB — GLUCOSE, CAPILLARY
GLUCOSE-CAPILLARY: 79 mg/dL (ref 65–99)
GLUCOSE-CAPILLARY: 203 mg/dL — AB (ref 65–99)
GLUCOSE-CAPILLARY: 215 mg/dL — AB (ref 65–99)
Glucose-Capillary: 39 mg/dL — CL (ref 65–99)
Glucose-Capillary: 42 mg/dL — CL (ref 65–99)
Glucose-Capillary: 40 mg/dL — CL (ref 65–99)
Glucose-Capillary: 247 mg/dL — ABNORMAL HIGH (ref 65–99)

## 2016-03-27 LAB — VALPROIC ACID LEVEL: Valproic Acid Lvl: 63 ug/mL (ref 50.0–100.0)

## 2016-03-27 MED ORDER — SODIUM CHLORIDE 0.9 % IV SOLN
INTRAVENOUS | Status: DC
  Administered 2016-03-27: 11:00:00 via INTRAVENOUS

## 2016-03-27 MED ORDER — SODIUM CHLORIDE 0.9 % IV SOLN
INTRAVENOUS | Status: DC
  Administered 2016-03-27: 09:00:00 via INTRAVENOUS

## 2016-03-27 MED ORDER — INSULIN GLARGINE 100 UNIT/ML ~~LOC~~ SOLN
10.0000 [IU] | Freq: Every day | SUBCUTANEOUS | Status: DC
  Administered 2016-03-27 – 2016-03-30 (×4): 10 [IU] via SUBCUTANEOUS
  Filled 2016-03-27 (×5): qty 0.1

## 2016-03-27 MED ORDER — GLUCOSE 40 % PO GEL
ORAL | Status: AC
  Administered 2016-03-27: 37.5 g
  Filled 2016-03-27: qty 1

## 2016-03-27 NOTE — Progress Notes (Signed)
CT findings reviewed with Dr. Gifford Shave.  Given ongoing questions regarding intracranial lesions, I would recommend consideration of MRI.  Patient has Baldwin pacemaker placed 09/01/2013 by Sanda Klein, MD.    (Device details: [employer] Advantio model [id number] serial number [id number] Right ventricular lead Guidant Dextrus [id number] serial number [id number])   This model of pacemaker is considered MRI-conditional which may mean that MRI could be performed under safe conditions.  Below, I have excerpted a reference for this.  Click here for MRI-conditional pacemaker reference:     Excerpt: "Hana [employer] ([place], [place], [place]) MRI pacemakers are available in single and dual chamber with CE approval. The devices are based on the Ingenio conventional pacemaker and are compatible with the Latitude remote monitoring system. They include a programmable MRI timer designed to return pacemaker settings back to normal after the scan. FINELINE II 5.67F active fixation leads received backwards MRI-conditional approval. A multicenter, nonrandomized single-arm study (INFINITE MRI)63 to collect data on the Pollock pacing system (consisting of an Ingenio MRI or Advantio MRI pacemaker with FINELINE II Sterox or FINELINE II Sterox EZ leads) is presently underway."

## 2016-03-27 NOTE — Progress Notes (Addendum)
PROGRESS NOTE    SABRI FETTEROLF  [id number] DOB: 05-Jul-1929 DOA: 03/25/2016 PCP: [name], MD Brief Narrative:Malakye FERDINAND ALLIE is an 82 y.o. male with H/o atrial fibrillation not on anticoagulation, pacemaker- for sinus node dysfunction, mild to mod AS, subdural hematoma, diet controlled diabetes, transitional cell carcinoma with intermittent hematuria, s/p chemotherapy, TIA, HTN was discharged from [facility] 9/13 to [facility] for rehab after long hospitalization for new onset seizures and possible brain metastasis noted on CT head, he was seen by Neurology, NEurosurgery and Oncology, couldn't have an MRI due to pacer. Finally plan was for FU with Dr.Shadad in Nov with plan for repeat CT with SRS protocol. Patient was discharged from [facility] 9 days ago. At that time he was feeling near his baseline. However, he quickly deteriorated developing generalized weakness which is been constant and progressive. Denies any focal complaint such as seizure, chest pain, shortness breath, palpitations, nausea, vomiting, abdominal pain, dysuria, frequency, fevers. Per daughter at bedside patient has been immobile and not taking part in his exercise regime since he has come home from [facility].  In ER noted to have AKI, creatinine of 1.9 from baseline of 1, NA was 126, K was 6.2 CT head with Probable new focus of enhancement within the right paramedian frontal lobe,previously identified by right frontal parafalcine hyperdense focus is no longer appreciated, possibly having represented an area of hemorrhage, Left occipital hyperdense focus with uncertain enhancement.  Assessment & Plan:  Generalized weakness: Likely multifactorial including dehydration, metabolic derangement, polypharmacy Depakote, progressive metastatic disease. - Pt/OT - anticipate discharge back to [facility] of other SNF/Rehab - Depakote level is 46, since he is elderly, will not change dose - cut down IVF  AKI with hyperkalemia and  hyponatremia -improved, cut down IVF  Diabetes: likely steroid induced.  - H/o Pre-DM as last A1c 5.5 - Hyperglycemia in 300s yesterday, started lantus and meal coverage now with hypoglycemia, cut down lantus and stopped meal coverage,  SSI  Brain lesions/Seizures: Noted  last admission. Suspected to have possible metastatic R frontal and 76mm L occipital lobe brain lesion. Pt followed by Neurosurgery, NEurology, and Oncology.  -unable to have MRI due to pacer -Per review of DC note :""Abnormal Brain CT. The patient's case was presented at multidisciplinary brain oncology conference  including neuroradiology, three neurosurgeons, and two radiation oncologists. In reviewing the imaging (limited by the fact we cannot recommend MRI), the patient's anomalies enchance on non-contrasted studies, though not on the contrasted images. At the conclusion of the discussion, recommendations were made to repeat a CT with and without contrast with SRS protocol in 2 months. Brain navigator will coordinate this with the patient and family, and Dr. Alen Blew will continue to follow him for his history of bladder cancer." -Pt was started on Decadron and Depakote. - continue Depakote, Decadron - Neuro following, Dr.Shadad and Rad Onc Dr.Manning felt that these are less likely malignant, - discussed with Neurology Dr.Turk again this am, he called and reviewed images with 2 neuroradiologists and they feel these are likely malignant lesions. 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: noted during previous admission - Renal US with mild hydronephrosis only -creatinine improved  HTN: - continue norvasc  H/o Stroke/CAD: - continue plavix,   GERD: - continue ppi   ETOH abuse: - No further ETOH since previous admission  ANxiety: - continue xanax prn  DVT prophylaxis:Hold Heparin SQ Code Status:Full Code Family Communication:No  family at bedside, d/w daughter Disposition Plan:will likely need SNF     Subjective: Feels ok, just " Blah" and weak overall  Objective: Vitals:   03/26/16 0629 03/26/16 1355 03/26/16 2105 03/27/16 0540  BP: 114/67 123/62 121/65 120/65  Pulse: 66 63 63 62  Resp: 18 19 18 19   Temp: 97.5 F (36.4 C) 97.8 F (36.6 C) 97.3 F (36.3 C) 97.4 F (36.3 C)  TempSrc:    Oral  SpO2: 97% 100% 100% 100%  Weight:      Height:        Intake/Output Summary (Last 24 hours) at 03/27/16 1103 Last data filed at 03/27/16 0642  Gross per 24 hour  Intake          2162.08 ml  Output             1100 ml  Net          1062.08 ml   Filed Weights   03/25/16 1022 03/25/16 2036  Weight: 86.6 kg (191 lb) 79.3 kg (174 lb 14.4 oz)    Examination:  General exam: Appears calm and comfortable  Respiratory system: Clear to auscultation. Respiratory effort normal. Cardiovascular system: S1 & S2 heard, RRR. No JVD, murmurs, rubs, gallops or clicks. No pedal edema. Gastrointestinal system: Abdomen is nondistended, soft and nontender. No organomegaly or masses felt. Normal bowel sounds heard. Central nervous system: Alert and oriented. No focal neurological deficits. Extremities: Symmetric 5 x 5 power. Skin: No rashes, lesions or ulcers Psychiatry: Judgement and insight appear normal. Mood & affect appropriate.     Data Reviewed: I have personally reviewed following labs and imaging studies  CBC:  Recent Labs Lab 03/25/16 1027 03/26/16 0634 03/27/16 0631  WBC 12.5* 10.8* 9.6  NEUTROABS 11.7*  --   --   HGB 15.5 14.6 13.8  HCT 42.5 41.6 39.1  MCV 101.2* 102.5* 102.4*  PLT 85* 67* 53*   Basic Metabolic Panel:  Recent Labs Lab 03/25/16 1027 03/25/16 1156 03/25/16 1903 03/26/16 0634 03/27/16 0631  NA 126* 128* 130* 132* 134*  K 6.2* 5.6* 4.9 5.2* 4.7  CL 93* 93* 94* 92* 97*  CO2 19* 24 26 28 29   GLUCOSE 546* 500* 248* 230* 48*  BUN 77* 74* 59* 42* 30*  CREATININE 1.94* 1.76*  1.39* 1.13 0.90  CALCIUM 8.8* 8.9 9.0 8.7* 8.6*   GFR: Estimated Creatinine Clearance: 61.1 mL/min (by C-G formula based on SCr of 0.9 mg/dL). Liver Function Tests: No results for input(s): AST, ALT, ALKPHOS, BILITOT, PROT, ALBUMIN in the last 168 hours. No results for input(s): LIPASE, AMYLASE in the last 168 hours. No results for input(s): AMMONIA in the last 168 hours. Coagulation Profile: No results for input(s): INR, PROTIME in the last 168 hours. Cardiac Enzymes: No results for input(s): CKTOTAL, CKMB, CKMBINDEX, TROPONINI in the last 168 hours. BNP (last 3 results) No results for input(s): PROBNP in the last 8760 hours. HbA1C:  Recent Labs  03/25/16 1903  HGBA1C 7.9*   CBG:  Recent Labs Lab 03/26/16 2108 03/27/16 0807 03/27/16 0822 03/27/16 0853 03/27/16 0950  GLUCAP 322* 39* 42* 40* 79   Lipid Profile: No results for input(s): CHOL, HDL, LDLCALC, TRIG, CHOLHDL, LDLDIRECT in the last 72 hours. Thyroid Function Tests: No results for input(s): TSH, T4TOTAL, FREET4, T3FREE, THYROIDAB in the last 72 hours. Anemia Panel: No results for input(s): VITAMINB12, FOLATE, FERRITIN, TIBC, IRON, RETICCTPCT in the last 72 hours. Urine analysis:    Component Value Date/Time  COLORURINE YELLOW 03/25/2016 1200   APPEARANCEUR CLEAR 03/25/2016 1200   LABSPEC 1.028 03/25/2016 1200   PHURINE 5.5 03/25/2016 1200   GLUCOSEU >1000 (A) 03/25/2016 1200   HGBUR SMALL (A) 03/25/2016 1200   BILIRUBINUR NEGATIVE 03/25/2016 1200   KETONESUR NEGATIVE 03/25/2016 1200   PROTEINUR NEGATIVE 03/25/2016 1200   UROBILINOGEN 1.0 04/03/2012 1018   NITRITE NEGATIVE 03/25/2016 1200   LEUKOCYTESUR NEGATIVE 03/25/2016 1200   Sepsis Labs: @LABRCNTIP (procalcitonin:4,lacticidven:4)  )No results found for this or any previous visit (from the past 240 hour(s)).       Radiology Studies: Ct Head W & Wo Contrast  Result Date: 03/25/2016 CLINICAL DATA:  82 y/o  M; weakness. EXAM: CT HEAD WITHOUT  AND WITH CONTRAST TECHNIQUE: Contiguous axial images were obtained from the base of the skull through the vertex without and with intravenous contrast CONTRAST:  14mL ISOVUE-300 IOPAMIDOL (ISOVUE-300) INJECTION 61%<Contrast>50mL ISOVUE-300 IOPAMIDOL (ISOVUE-300) INJECTION 61% COMPARISON:  CT of head with contrast dated 02/28/2016 FINDINGS: Brain: Stable left inferior frontal encephalomalacia. Stable hyperdense focus in the left occipital lobe measuring 10 mm (series 2, image 14). Postcontrast administration the lesion appears to enhance only on the coronal reconstruction (compare series 8, image 33 with series 6, image 54). Additionally, there is a probable new focus of enhancement in the right paramedian frontal lobe (series 4, image 15) No evidence for large territory infarct or new intracranial hemorrhage. Stable mild to moderate chronic microvascular ischemic changes and parenchymal volume loss. Vascular: Calcific atherosclerosis of carotid siphons. Skull: Postsurgical changes related to a left frontal craniotomy are stable. All Sinuses/Orbits: Right mastoid opacification with sclerosis compatible with sequelae of chronic otomastoiditis, stable. Mild paranasal sinus disease with sphenoid and ethmoid patchy opacification. Left mastoid air cells are clear. Orbits are unremarkable. Other: Right parotidectomy. IMPRESSION: 1. No evidence of large territory infarct or new intracranial hemorrhage. 2. Probable new focus of enhancement within the right paramedian frontal lobe. Differential includes metastatic disease or subacute infarction. 3. Previous identified by right frontal parafalcine hyperdense focus is no longer appreciated, possibly having represented an area of hemorrhage. 4. Left occipital hyperdense focus with uncertain enhancement. Stable in size in comparison with prior studies. Electronically Signed   By: Kristine Garbe M.D.   On: 03/25/2016 17:29   US Renal  Result Date: 03/25/2016 CLINICAL  DATA:  Hydronephrosis due to obstruction of the ureter. Patient with history of urothelial carcinoma treated with chemotherapy most recently November 27, 2015. Noted to have mild right hydroureteronephrosis to the level of the proximal right ureter on prior CT of 03/01/2016. EXAM: RENAL / URINARY TRACT ULTRASOUND COMPLETE COMPARISON:  CT 03/01/2016 FINDINGS: Right Kidney: Length: 9.7 cm. Echogenicity within normal limits. No mass or hydronephrosis visualized. Cortical - medullary distinction is maintained. There is fullness of the right renal collecting system likely representing mild hydronephrosis (images 16 and 18). A hydroureter however was not visualized. Left Kidney: Length: 10 cm. Echogenicity within normal limits. No mass or hydronephrosis visualized. Bladder: Physiologically distended. Only a left-sided ureteral jet could be confirmed despite several minutes of imaging per discussion with ultrasound technologist. IMPRESSION: Mild right-sided hydronephrosis. Absent right ureteral jet suggests continued obstruction. Patent left ureter. Electronically Signed   By: Ashley Royalty M.D.   On: 03/25/2016 17:51        Scheduled Meds: . amLODipine  10 mg Oral Daily  . Chlorhexidine Gluconate Cloth  6 each Topical Q0600  . clopidogrel  75 mg Oral Daily  . dexamethasone  4 mg Oral Daily  .  divalproex  500 mg Oral Q12H  . feeding supplement (GLUCERNA SHAKE)  237 mL Oral BID BM  . insulin aspart  0-15 Units Subcutaneous TID WC  . insulin aspart  0-5 Units Subcutaneous QHS  . insulin glargine  10 Units Subcutaneous QHS  . mupirocin ointment  1 application Nasal BID  . pantoprazole  40 mg Oral Daily  . sodium chloride flush  3 mL Intravenous Q12H   Continuous Infusions: . sodium chloride       LOS: 1 day    Time spent: 69min    Domenic Polite, MD [facility] Pager [phone]  If 7PM-7AM, please contact night-coverage [email or web address] Password [email or web address] 03/27/2016, 11:03 AM

## 2016-03-27 NOTE — Progress Notes (Signed)
CBG 39 this am. Pt is A&O X4. Pt denies symptoms of low blood glucose. Pt currently drinking orange juice. Will re-check CBG in 15 minutes. Will make Dr Broadus John aware. Will continue to monitor pt. Bed remains in lowest position and call bell is within reach.

## 2016-03-27 NOTE — Progress Notes (Signed)
Neurology communication noted. Case was discussed with primary team and medical oncology team. All the radiological images of the head were reviewed together with the neuroradiologist. CT scan of the head findings are most likely consistent with metastatic brain disease especially in the right frontal and left occipital region and are unlikely to be strokes based on CT scan finding and clinical history. Patient also has multiple vascular risk factors and also has underlying cerebrovascular disease. It was also discussed patient's symptoms are due to multifactorial reasons including old age, metastatic disease, failure to thrive and metabolic encephalopathy.

## 2016-03-27 NOTE — Progress Notes (Signed)
CBG was re-checked with 42, the result. More juice was given. 15 mins later, CBG re-checked & was 40.  0900 Glucose Gel given. Will re-check CBG. Pt remains A&O X4.

## 2016-03-27 NOTE — Progress Notes (Signed)
IP PROGRESS NOTE  Subjective:   No major changes noted over night. He has no other complaints. He remains overall weak.  Objective:  Vital signs in last 24 hours: Temp:  [97.3 F (36.3 C)-97.4 F (36.3 C)] 97.4 F (36.3 C) (10/06 0540) Pulse Rate:  [62-63] 62 (10/06 0540) Resp:  [18-19] 19 (10/06 0540) BP: (108-121)/(64-65) 108/64 (10/06 1100) SpO2:  [100 %] 100 % (10/06 0540) Weight change:  Last BM Date: 03/25/16  Intake/Output from previous day: 10/05 0701 - 10/06 0700 In: 2162.1 [I.V.:2162.1] Out: 34 [Urine:1100]  Elderly frail gentleman without distress. Mouth: mucous membranes moist, pharynx normal without lesions no ulcers or lesions. Resp: clear to auscultation bilaterally Cardio: regular rate and rhythm, S1, S2 normal, no murmur, click, rub or gallop GI: soft, non-tender; bowel sounds normal; no masses,  no organomegaly Extremities: extremities normal, atraumatic, no cyanosis or edema Skin: Ecchymosis noted on his upper extremities. Neurological: No focal deficits noted.   Lab Results:  Recent Labs  03/26/16 0634 03/27/16 0631  WBC 10.8* 9.6  HGB 14.6 13.8  HCT 41.6 39.1  PLT 67* 53*    BMET  Recent Labs  03/26/16 0634 03/27/16 0631  NA 132* 134*  K 5.2* 4.7  CL 92* 97*  CO2 28 29  GLUCOSE 230* 48*  BUN 42* 30*  CREATININE 1.13 0.90  CALCIUM 8.7* 8.6*     Medications: I have reviewed the patient's current medications.  Assessment/Plan:     82 year old gentleman with the following issues:  1. 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 CT scan in January 2017 showed pelvic adenopathy indicating metastatic disease.  He is status post salvage chemotherapy utilizing carboplatin and gemcitabine with his last CT scan obtained in September 2017 showed no evidence of metastatic disease.  2. Seizure: Patient hospitalized on 02/28/2016 and CT scan of  the head with contrast showed 2 masses in the left occipital lobe and the right frontal lobe with vasogenic edema suspicious for metastatic disease.  Repeat CT scan on 03/25/2016 showed a new lesion at the right frontal lobe but the previous right frontal lobe hyperdense area is no longer appreciated. His left occipital focus of uncertain enhancement appears to be stable.   These findings continued to be rather confusing and not necessarily consistent with malignancy although malignancy is certainly not ruled out.  Input from Dr. Gifford Shave from neurology greatly appreciated and per discussion with him, he feels that these findings suggest metastatic disease rather than hemorrhagic strokes.  Dr. Johny Shears input is appreciated and he feels that MRI would be helpful if it could be done given his spacemaker.  This was discussed with the patient and his daughter extensively and explained to them that unless we have a definitive evidence of brain metastasis, brain radiation might not be in his best interest. All their questions were answered today.  I will follow up on Monday, 03/30/2016 to assess his progress.    LOS: 1 day   Chrisha Vogel 03/27/2016, 3:40 PM

## 2016-03-28 LAB — GLUCOSE, CAPILLARY
GLUCOSE-CAPILLARY: 365 mg/dL — AB (ref 65–99)
Glucose-Capillary: 158 mg/dL — ABNORMAL HIGH (ref 65–99)
Glucose-Capillary: 252 mg/dL — ABNORMAL HIGH (ref 65–99)
Glucose-Capillary: 361 mg/dL — ABNORMAL HIGH (ref 65–99)

## 2016-03-28 NOTE — Progress Notes (Addendum)
PROGRESS NOTE    [name]  [id number] DOB: Feb 21, 1930 DOA: 03/25/2016 PCP: [name], MD Brief Narrative:Alwin ZATHAN BERTH is an 82 y.o. male with H/o atrial fibrillation not on anticoagulation, pacemaker- for sinus node dysfunction, mild to mod AS, subdural hematoma, diet controlled diabetes, transitional cell carcinoma with intermittent hematuria, s/p chemotherapy, TIA, HTN was discharged from [facility] 9/13 to [facility] for [facility] after long hospitalization for new onset seizures and possible brain metastasis noted on CT head, he was seen by Neurology, NEurosurgery and Oncology, couldn't have an MRI due to pacer. Finally plan was for FU with Dr.Shadad in Nov with plan for repeat CT with SRS protocol. Patient was discharged from [facility] 9 days ago. At that time he was feeling near his baseline. However, he quickly deteriorated developing generalized weakness which is been constant and progressive. Denies any focal complaint such as seizure, chest pain, shortness breath, palpitations, nausea, vomiting, abdominal pain, dysuria, frequency, fevers. Per daughter at bedside patient has been immobile and not taking part in his exercise regime since he has come home from [facility].  In ER noted to have AKI, creatinine of 1.9 from baseline of 1, NA was 126, K was 6.2 CT head with Probable new focus of enhancement within the right paramedian frontal lobe,previously identified by right frontal parafalcine hyperdense focus is no longer appreciated, possibly having represented an area of hemorrhage, Left occipital hyperdense focus with uncertain enhancement.  Assessment & Plan:  Generalized weakness: Likely multifactorial including dehydration, metabolic derangement, polypharmacy Depakote, progressive metastatic disease. - Pt/OT - anticipate discharge back to [facility] of other SNF/Rehab - Depakote level is 46, since he is elderly, will not change dose - cut down IVF  AKI with hyperkalemia and  hyponatremia -improved, cut down IVF  Diabetes: likely steroid induced.  - H/o Pre-DM as last A1c 5.5 - Hyperglycemia in 300s yesterday, started lantus and meal coverage now with hypoglycemia, cut down lantus and stopped meal coverage,  SSI  Brain lesions/Seizures: Noted  last admission. Suspected to have possible metastatic R frontal and 65mm L occipital lobe brain lesion. Pt followed by Neurosurgery, NEurology, and Oncology.  -unable to have MRI due to pacer -Per review of DC note :""Abnormal Brain CT. The patient's case was presented at multidisciplinary brain oncology conference  including neuroradiology, three neurosurgeons, and two radiation oncologists. In reviewing the imaging (limited by the fact we cannot recommend MRI), the patient's anomalies enchance on non-contrasted studies, though not on the contrasted images. At the conclusion of the discussion, recommendations were made to repeat a CT with and without contrast with SRS protocol in 2 months. Brain navigator will coordinate this with the patient and family, and Dr. Alen Blew will continue to follow him for his history of bladder cancer." -Pt was started on Decadron and Depakote. - continue Depakote, Decadron - Neuro following, Dr.Shadad and Rad Onc Dr.Manning felt that these lesions were less likely malignant, - discussed with Neurology Dr.Turk again, he called and reviewed images with 2 neuroradiologists and they feel these are likely malignant lesions. Dr.Turk d/w Dr.Manning 10/6 -we are trying to figure out if MRI is possible in this patient, per Dr.Manning it is MRI conditional. 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: noted during previous admission - Renal US with mild hydronephrosis only -creatinine improved  HTN: - continue norvasc  H/o Stroke/CAD: - continue  plavix,   GERD: - continue ppi   ETOH abuse: - No further ETOH since previous admission  ANxiety: - continue xanax prn  Thrombocytopenia -heparin on hold, Plts down further -check HIT panel -could be due to depakote  DVT prophylaxis:Hold Heparin SQ Code Status:Full Code Family Communication:No family at bedside, d/w daughter 10/5 Disposition Plan:needs SNF     Subjective: Feels ok, just " Blah" and weak overall  Objective: Vitals:   03/27/16 0540 03/27/16 1100 03/27/16 2041 03/28/16 0525  BP: 120/65 108/64 111/61 123/65  Pulse: 62  61 61  Resp: 19  20 20   Temp: 97.4 F (36.3 C)  98.4 F (36.9 C) 98.4 F (36.9 C)  TempSrc: Oral     SpO2: 100%  97% 100%  Weight:      Height:        Intake/Output Summary (Last 24 hours) at 03/28/16 1252 Last data filed at 03/28/16 1242  Gross per 24 hour  Intake          1034.17 ml  Output             1025 ml  Net             9.17 ml   Filed Weights   03/25/16 1022 03/25/16 2036  Weight: 86.6 kg (191 lb) 79.3 kg (174 lb 14.4 oz)    Examination:  General exam: Appears calm and comfortable  Respiratory system: Clear to auscultation. Respiratory effort normal. Cardiovascular system: S1 & S2 heard, RRR. No JVD, murmurs, rubs, gallops or clicks. No pedal edema. Gastrointestinal system: Abdomen is nondistended, soft and nontender. No organomegaly or masses felt. Normal bowel sounds heard. Central nervous system: Alert and oriented. No focal neurological deficits. Extremities: Symmetric 5 x 5 power. Skin: No rashes, lesions or ulcers Psychiatry: Judgement and insight appear normal. Mood & affect appropriate.     Data Reviewed: I have personally reviewed following labs and imaging studies  CBC:  Recent Labs Lab 03/25/16 1027 03/26/16 0634 03/27/16 0631  WBC 12.5* 10.8* 9.6  NEUTROABS 11.7*  --   --   HGB 15.5 14.6 13.8  HCT 42.5 41.6 39.1  MCV 101.2* 102.5* 102.4*  PLT 85* 67* 53*   Basic Metabolic  Panel:  Recent Labs Lab 03/25/16 1027 03/25/16 1156 03/25/16 1903 03/26/16 0634 03/27/16 0631  NA 126* 128* 130* 132* 134*  K 6.2* 5.6* 4.9 5.2* 4.7  CL 93* 93* 94* 92* 97*  CO2 19* 24 26 28 29   GLUCOSE 546* 500* 248* 230* 48*  BUN 77* 74* 59* 42* 30*  CREATININE 1.94* 1.76* 1.39* 1.13 0.90  CALCIUM 8.8* 8.9 9.0 8.7* 8.6*   GFR: Estimated Creatinine Clearance: 61.1 mL/min (by C-G formula based on SCr of 0.9 mg/dL). Liver Function Tests: No results for input(s): AST, ALT, ALKPHOS, BILITOT, PROT, ALBUMIN in the last 168 hours. No results for input(s): LIPASE, AMYLASE in the last 168 hours. No results for input(s): AMMONIA in the last 168 hours. Coagulation Profile: No results for input(s): INR, PROTIME in the last 168 hours. Cardiac Enzymes: No results for input(s): CKTOTAL, CKMB, CKMBINDEX, TROPONINI in the last 168 hours. BNP (last 3 results) No results for input(s): PROBNP in the last 8760 hours. HbA1C:  Recent Labs  03/25/16 1903  HGBA1C 7.9*   CBG:  Recent Labs Lab 03/27/16 1226 03/27/16 1659 03/27/16 2038 03/28/16 0815 03/28/16 1142  GLUCAP 203* 247* 215* 158* 252*   Lipid Profile: No results for input(s): CHOL,  HDL, LDLCALC, TRIG, CHOLHDL, LDLDIRECT in the last 72 hours. Thyroid Function Tests: No results for input(s): TSH, T4TOTAL, FREET4, T3FREE, THYROIDAB in the last 72 hours. Anemia Panel: No results for input(s): VITAMINB12, FOLATE, FERRITIN, TIBC, IRON, RETICCTPCT in the last 72 hours. Urine analysis:    Component Value Date/Time   COLORURINE YELLOW 03/25/2016 1200   APPEARANCEUR CLEAR 03/25/2016 1200   LABSPEC 1.028 03/25/2016 1200   PHURINE 5.5 03/25/2016 1200   GLUCOSEU >1000 (A) 03/25/2016 1200   HGBUR SMALL (A) 03/25/2016 1200   BILIRUBINUR NEGATIVE 03/25/2016 1200   KETONESUR NEGATIVE 03/25/2016 1200   PROTEINUR NEGATIVE 03/25/2016 1200   UROBILINOGEN 1.0 04/03/2012 1018   NITRITE NEGATIVE 03/25/2016 1200   LEUKOCYTESUR NEGATIVE  03/25/2016 1200   Sepsis Labs: @LABRCNTIP (procalcitonin:4,lacticidven:4)  )No results found for this or any previous visit (from the past 240 hour(s)).       Radiology Studies: No results found.      Scheduled Meds: . amLODipine  10 mg Oral Daily  . Chlorhexidine Gluconate Cloth  6 each Topical Q0600  . clopidogrel  75 mg Oral Daily  . dexamethasone  4 mg Oral Daily  . divalproex  500 mg Oral Q12H  . feeding supplement (GLUCERNA SHAKE)  237 mL Oral BID BM  . insulin aspart  0-15 Units Subcutaneous TID WC  . insulin aspart  0-5 Units Subcutaneous QHS  . insulin glargine  10 Units Subcutaneous QHS  . mupirocin ointment  1 application Nasal BID  . pantoprazole  40 mg Oral Daily  . sodium chloride flush  3 mL Intravenous Q12H   Continuous Infusions:     LOS: 2 days    Time spent: 6min    Domenic Polite, MD [facility] Pager [phone]  If 7PM-7AM, please contact night-coverage [email or web address] Password [email or web address] 03/28/2016, 12:52 PM

## 2016-03-29 LAB — HEPARIN INDUCED PLATELET AB (HIT ANTIBODY): Heparin Induced Plt Ab: 0.238 OD (ref 0.000–0.400)

## 2016-03-29 LAB — BASIC METABOLIC PANEL
Anion gap: 6 (ref 5–15)
BUN: 37 mg/dL — ABNORMAL HIGH (ref 6–20)
CHLORIDE: 96 mmol/L — AB (ref 101–111)
CO2: 29 mmol/L (ref 22–32)
CREATININE: 1.2 mg/dL (ref 0.61–1.24)
Calcium: 8.6 mg/dL — ABNORMAL LOW (ref 8.9–10.3)
GFR calc non Af Amer: 54 mL/min — ABNORMAL LOW (ref >60)
Glucose, Bld: 168 mg/dL — ABNORMAL HIGH (ref 65–99)
POTASSIUM: 4.9 mmol/L (ref 3.5–5.1)
SODIUM: 131 mmol/L — AB (ref 135–145)

## 2016-03-29 LAB — CBC
HCT: 37.3 % — ABNORMAL LOW (ref 39.0–52.0)
Hemoglobin: 13 g/dL (ref 13.0–17.0)
MCH: 35.9 pg — ABNORMAL HIGH (ref 26.0–34.0)
MCHC: 34.9 g/dL (ref 30.0–36.0)
MCV: 103 fL — AB (ref 78.0–100.0)
PLATELETS: 50 10*3/uL — AB (ref 150–400)
RBC: 3.62 MIL/uL — AB (ref 4.22–5.81)
RDW: 12.3 % (ref 11.5–15.5)
WBC: 10 10*3/uL (ref 4.0–10.5)

## 2016-03-29 LAB — GLUCOSE, CAPILLARY
GLUCOSE-CAPILLARY: 273 mg/dL — AB (ref 65–99)
GLUCOSE-CAPILLARY: 349 mg/dL — AB (ref 65–99)
GLUCOSE-CAPILLARY: 285 mg/dL — AB (ref 65–99)
Glucose-Capillary: 146 mg/dL — ABNORMAL HIGH (ref 65–99)

## 2016-03-29 MED ORDER — LEVETIRACETAM 500 MG PO TABS
500.0000 mg | ORAL_TABLET | Freq: Two times a day (BID) | ORAL | Status: DC
  Administered 2016-03-29 – 2016-04-01 (×7): 500 mg via ORAL
  Filled 2016-03-29 (×7): qty 1

## 2016-03-29 NOTE — Progress Notes (Signed)
Unfortunately, Mr. [name] has an abandoned (capped) atrial lead and is unable to safely undergo MRI scans.  Sanda Klein, MD, [facility] [phone] office [phone] pager

## 2016-03-29 NOTE — Progress Notes (Signed)
PROGRESS NOTE    [name]  [id number] DOB: Oct 24, 1929 DOA: 03/25/2016 PCP: Gabriel Pao, MD Brief Narrative:[name] is an 82 y.o. male with H/o atrial fibrillation not on anticoagulation, pacemaker- for sinus node dysfunction, mild to mod AS, subdural hematoma, diet controlled diabetes, transitional cell carcinoma with intermittent hematuria, s/p chemotherapy, TIA, HTN was discharged from [facility] 9/13 to [facility] after long hospitalization for new onset seizures and possible brain metastasis noted on CT head, he was seen by Neurology, NEurosurgery and Oncology, couldn't have an MRI due to pacer. Finally plan was for FU with Dr.Shadad in Nov with plan for repeat CT with SRS protocol. Patient was discharged from [facility] 9 days ago. At that time he was feeling near his baseline. However, he quickly deteriorated developing generalized weakness which is been constant and progressive. Denies any focal complaint such as seizure, chest pain, shortness breath, palpitations, nausea, vomiting, abdominal pain, dysuria, frequency, fevers. Per daughter at bedside patient has been immobile and not taking part in his exercise regime since he has come home from [facility].  In ER noted to have AKI, creatinine of 1.9 from baseline of 1, NA was 126, K was 6.2 CT head with Probable new focus of enhancement within the right paramedian frontal lobe,previously identified by right frontal parafalcine hyperdense focus is no longer appreciated, possibly having represented an area of hemorrhage, Left occipital hyperdense focus with uncertain enhancement.  Assessment & Plan:  Generalized weakness: Likely multifactorial including dehydration, metabolic derangement, polypharmacy Depakote, progressive metastatic disease. - Pt/OT - anticipate discharge back to [facility] of other SNF/Rehab - Depakote level is 46, since he is elderly, will not change dose - cut down IVF -improving, plan for  SNF  AKI with hyperkalemia and hyponatremia -improved, cut down IVF  Diabetes: likely steroid induced.  - H/o Pre-DM as last A1c 5.5 - Hyperglycemia in 300s yesterday, started lantus and meal coverage now with hypoglycemia, cut down lantus and stopped meal coverage,  SSI -CBG stable now  Brain lesions/Seizures: Noted  last admission. Suspected to have possible metastatic R frontal and 38mm L occipital lobe brain lesion. Pt followed by Neurosurgery, NEurology, and Oncology.  -unable to have MRI due to pacer -Per review of DC note :""Abnormal Brain CT. The patient's case was presented at multidisciplinary brain oncology conference  including neuroradiology, three neurosurgeons, and two radiation oncologists. In reviewing the imaging (limited by the fact we cannot recommend MRI), the patient's anomalies enchance on non-contrasted studies, though not on the contrasted images. At the conclusion of the discussion, recommendations were made to repeat a CT with and without contrast with SRS protocol in 2 months. Brain navigator will coordinate this with the patient and family, and Dr. Alen Blew will continue to follow him for his history of bladder cancer." -Pt was started on Decadron and Depakote. - continue Decadron, changed depakote to keppra due to worsening thrombocytopenia - Neuro following, Dr.Shadad and Rad Onc Dr.Manning felt that these lesions were less likely malignant, - discussed with Neurology Dr.Turk again, he called and reviewed images with 2 neuroradiologists and they feel these are likely malignant lesions. Dr.Turk d/w Dr.Manning 10/6 -we are trying to figure out if MRI is possible in this patient, per Dr.Manning it is MRI conditional. I called MRI and d/w dept: they reviewed pacemaker placed in march 2015 and felt that this was not compatible with MRIs -d/w Cards Dr.Croitoru, who will review pacer/leads and see if this is MRI conducive  Recent hydronephrosis  due to possible transitional  cell carcinoma of R ureter: noted during previous admission - Renal US with mild hydronephrosis only -creatinine improved  HTN: - continue norvasc  H/o Stroke/CAD: - continue plavix,   GERD: - continue ppi   ETOH abuse: - No further ETOH since previous admission  ANxiety: - continue xanax prn  Thrombocytopenia -heparin on hold, Plts down further -check HIT panel -could be due to depakote, will change this to [place] monitor  DVT prophylaxis:SCDs due to low platelets Code Status:Full Code Family Communication:No family at bedside, d/w daughter 10/5 Disposition Plan:needs SNF     Subjective: Feels ok, no distress  Objective: Vitals:   03/28/16 0525 03/28/16 1519 03/28/16 2114 03/29/16 0527  BP: 123/65 127/65 (!) 112/59 124/68  Pulse: 61 65 61 63  Resp: 20 16 19 19   Temp: 98.4 F (36.9 C) 98.7 F (37.1 C) 98.4 F (36.9 C) 98.1 F (36.7 C)  TempSrc:  Oral    SpO2: 100% 97% 98% 98%  Weight:      Height:        Intake/Output Summary (Last 24 hours) at 03/29/16 1302 Last data filed at 03/29/16 0929  Gross per 24 hour  Intake              480 ml  Output              400 ml  Net               80 ml   Filed Weights   03/25/16 1022 03/25/16 2036  Weight: 86.6 kg (191 lb) 79.3 kg (174 lb 14.4 oz)    Examination:  General exam: Appears calm and comfortable  Respiratory system: Clear to auscultation. Respiratory effort normal. Cardiovascular system: S1 & S2 heard, RRR. No JVD, murmurs, rubs, gallops or clicks. No pedal edema. Gastrointestinal system: Abdomen is nondistended, soft and nontender. No organomegaly or masses felt. Normal bowel sounds heard. Central nervous system: Alert and oriented. No focal neurological deficits. Extremities: Symmetric 5 x 5 power. Skin: No rashes, lesions or ulcers Psychiatry: Judgement and insight appear normal. Mood & affect appropriate.     Data Reviewed: I have personally reviewed following labs and imaging  studies  CBC:  Recent Labs Lab 03/25/16 1027 03/26/16 0634 03/27/16 0631 03/29/16 0511  WBC 12.5* 10.8* 9.6 10.0  NEUTROABS 11.7*  --   --   --   HGB 15.5 14.6 13.8 13.0  HCT 42.5 41.6 39.1 37.3*  MCV 101.2* 102.5* 102.4* 103.0*  PLT 85* 67* 53* 50*   Basic Metabolic Panel:  Recent Labs Lab 03/25/16 1156 03/25/16 1903 03/26/16 0634 03/27/16 0631 03/29/16 0511  NA 128* 130* 132* 134* 131*  K 5.6* 4.9 5.2* 4.7 4.9  CL 93* 94* 92* 97* 96*  CO2 24 26 28 29 29   GLUCOSE 500* 248* 230* 48* 168*  BUN 74* 59* 42* 30* 37*  CREATININE 1.76* 1.39* 1.13 0.90 1.20  CALCIUM 8.9 9.0 8.7* 8.6* 8.6*   GFR: Estimated Creatinine Clearance: 45.8 mL/min (by C-G formula based on SCr of 1.2 mg/dL). Liver Function Tests: No results for input(s): AST, ALT, ALKPHOS, BILITOT, PROT, ALBUMIN in the last 168 hours. No results for input(s): LIPASE, AMYLASE in the last 168 hours. No results for input(s): AMMONIA in the last 168 hours. Coagulation Profile: No results for input(s): INR, PROTIME in the last 168 hours. Cardiac Enzymes: No results for input(s): CKTOTAL, CKMB, CKMBINDEX, TROPONINI in the last 168 hours. BNP (last 3 results)  No results for input(s): PROBNP in the last 8760 hours. HbA1C: No results for input(s): HGBA1C in the last 72 hours. CBG:  Recent Labs Lab 03/28/16 1142 03/28/16 1711 03/28/16 2032 03/29/16 0816 03/29/16 1202  GLUCAP 252* 365* 361* 146* 273*   Lipid Profile: No results for input(s): CHOL, HDL, LDLCALC, TRIG, CHOLHDL, LDLDIRECT in the last 72 hours. Thyroid Function Tests: No results for input(s): TSH, T4TOTAL, FREET4, T3FREE, THYROIDAB in the last 72 hours. Anemia Panel: No results for input(s): VITAMINB12, FOLATE, FERRITIN, TIBC, IRON, RETICCTPCT in the last 72 hours. Urine analysis:    Component Value Date/Time   COLORURINE YELLOW 03/25/2016 1200   APPEARANCEUR CLEAR 03/25/2016 1200   LABSPEC 1.028 03/25/2016 1200   PHURINE 5.5 03/25/2016 1200    GLUCOSEU >1000 (A) 03/25/2016 1200   HGBUR SMALL (A) 03/25/2016 1200   BILIRUBINUR NEGATIVE 03/25/2016 1200   KETONESUR NEGATIVE 03/25/2016 1200   PROTEINUR NEGATIVE 03/25/2016 1200   UROBILINOGEN 1.0 04/03/2012 1018   NITRITE NEGATIVE 03/25/2016 1200   LEUKOCYTESUR NEGATIVE 03/25/2016 1200   Sepsis Labs: @LABRCNTIP (procalcitonin:4,lacticidven:4)  )No results found for this or any previous visit (from the past 240 hour(s)).       Radiology Studies: No results found.      Scheduled Meds: . amLODipine  10 mg Oral Daily  . Chlorhexidine Gluconate Cloth  6 each Topical Q0600  . clopidogrel  75 mg Oral Daily  . dexamethasone  4 mg Oral Daily  . feeding supplement (GLUCERNA SHAKE)  237 mL Oral BID BM  . insulin aspart  0-15 Units Subcutaneous TID WC  . insulin aspart  0-5 Units Subcutaneous QHS  . insulin glargine  10 Units Subcutaneous QHS  . levETIRAcetam  500 mg Oral BID  . mupirocin ointment  1 application Nasal BID  . pantoprazole  40 mg Oral Daily  . sodium chloride flush  3 mL Intravenous Q12H   Continuous Infusions:     LOS: 3 days    Time spent: 71min    Domenic Polite, MD [facility] Pager [phone]  If 7PM-7AM, please contact night-coverage [email or web address] Password [email or web address] 03/29/2016, 1:02 PM

## 2016-03-30 ENCOUNTER — Encounter (HOSPITAL_COMMUNITY): Payer: Self-pay | Admitting: Radiology

## 2016-03-30 ENCOUNTER — Inpatient Hospital Stay (HOSPITAL_COMMUNITY): Payer: Medicare Other

## 2016-03-30 ENCOUNTER — Ambulatory Visit
Admit: 2016-03-30 | Discharge: 2016-03-30 | Disposition: A | Discharge status: Home / Self Care | Payer: Medicare Other | Attending: Radiation Oncology | Admitting: Radiation Oncology

## 2016-03-30 DIAGNOSIS — I2699 Other pulmonary embolism without acute cor pulmonale: Secondary | ICD-10-CM

## 2016-03-30 DIAGNOSIS — R9089 Other abnormal findings on diagnostic imaging of central nervous system: Secondary | ICD-10-CM

## 2016-03-30 LAB — CBC
HEMATOCRIT: 41.8 % (ref 39.0–52.0)
HEMOGLOBIN: 14.8 g/dL (ref 13.0–17.0)
MCH: 36.1 pg — AB (ref 26.0–34.0)
MCHC: 35.4 g/dL (ref 30.0–36.0)
MCV: 102 fL — ABNORMAL HIGH (ref 78.0–100.0)
Platelets: 73 10*3/uL — ABNORMAL LOW (ref 150–400)
RBC: 4.1 MIL/uL — ABNORMAL LOW (ref 4.22–5.81)
RDW: 12.1 % (ref 11.5–15.5)
WBC: 14.9 10*3/uL — ABNORMAL HIGH (ref 4.0–10.5)

## 2016-03-30 LAB — GLUCOSE, CAPILLARY
Glucose-Capillary: 95 mg/dL (ref 65–99)
Glucose-Capillary: 93 mg/dL (ref 65–99)
Glucose-Capillary: 41 mg/dL — CL (ref 65–99)
Glucose-Capillary: 127 mg/dL — ABNORMAL HIGH (ref 65–99)
Glucose-Capillary: 134 mg/dL — ABNORMAL HIGH (ref 65–99)

## 2016-03-30 LAB — BASIC METABOLIC PANEL
ANION GAP: 10 (ref 5–15)
BUN: 31 mg/dL — ABNORMAL HIGH (ref 6–20)
CALCIUM: 8.7 mg/dL — AB (ref 8.9–10.3)
CHLORIDE: 92 mmol/L — AB (ref 101–111)
CO2: 30 mmol/L (ref 22–32)
Creatinine, Ser: 0.97 mg/dL (ref 0.61–1.24)
GFR calc non Af Amer: >60 mL/min (ref >60)
Glucose, Bld: 118 mg/dL — ABNORMAL HIGH (ref 65–99)
POTASSIUM: 4.5 mmol/L (ref 3.5–5.1)
SODIUM: 132 mmol/L — AB (ref 135–145)

## 2016-03-30 MED ORDER — DEXTROSE 50 % IV SOLN
50.0000 mL | Freq: Once | INTRAVENOUS | Status: AC
  Administered 2016-03-30: 50 mL via INTRAVENOUS
  Administered 2016-03-31: 25 mL via INTRAVENOUS

## 2016-03-30 MED ORDER — SENNOSIDES-DOCUSATE SODIUM 8.6-50 MG PO TABS
1.0000 | ORAL_TABLET | Freq: Every day | ORAL | Status: DC
  Administered 2016-03-30: 1 via ORAL
  Filled 2016-03-30: qty 1

## 2016-03-30 MED ORDER — IOPAMIDOL (ISOVUE-370) INJECTION 76%
INTRAVENOUS | Status: AC
  Administered 2016-03-30: 100 mL
  Filled 2016-03-30: qty 100

## 2016-03-30 MED ORDER — OXYCODONE HCL 5 MG PO TABS
5.0000 mg | ORAL_TABLET | Freq: Four times a day (QID) | ORAL | Status: DC
  Administered 2016-03-30 – 2016-03-31 (×3): 5 mg via ORAL
  Filled 2016-03-30 (×3): qty 1

## 2016-03-30 MED ORDER — DEXTROSE 50 % IV SOLN
INTRAVENOUS | Status: AC
  Filled 2016-03-30: qty 50

## 2016-03-30 MED ORDER — MORPHINE SULFATE (PF) 2 MG/ML IV SOLN
2.0000 mg | INTRAVENOUS | Status: DC | PRN
  Administered 2016-03-30 – 2016-03-31 (×4): 2 mg via INTRAVENOUS
  Filled 2016-03-30 (×4): qty 1

## 2016-03-30 NOTE — Progress Notes (Signed)
PT Cancellation Note  Patient Details Name: [name] MRN: [id number] DOB: 1929/11/06   Cancelled Treatment:    Reason Eval/Treat Not Completed: Medical issues which prohibited therapy. Pt with acute PE. PT will continue to follow acutely.    Salina April, PTA Pager: [phone]   03/30/2016, 2:16 PM

## 2016-03-30 NOTE — Progress Notes (Signed)
Addendum: D/w Dr.Manning and Shadad, Palliative team consulted CTA with acute and chronic PE, not a candidate for anticoagulation due to brain mets/lesions Ordered IVC filter Called and updated daughter this afternoon  Domenic Polite, MD

## 2016-03-30 NOTE — Consult Note (Signed)
Consultation Note Date: 03/30/2016   Patient Name: [name]  DOB: [birth year]  MRN: [id number]  Age / Sex: 82 y.o., male  PCP: Gabriel Pao, MD Referring Physician: Domenic Polite, MD  Reason for Consultation: Establishing goals of care  HPI/Patient Profile: 82 y.o. 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 He was recently hospitalized from 9/8-9/13 for new onset-seizures and found to have a left occipital lobe mass and a right frontal lobe mass on CT head. He improved after antiepileptics and steroids and was discharged on 9/13 to [facility]. He apparently saw Neurosurgery as an outpatient and no changes were made to his medications as he remained seizure free and he was scheduled to have a repeat CT head with Oncology for further management but was readmitted on 10/4 after he "lost all strength" while ambulating to the bathroom and was unable to get up. During this admission he has undergone repeat CT head which showed a new right frontal lobe focus, resolution of the previous right frontal lobe mass, and a stable left occipital lobe mass. He was started on [name] and [name] was changed to [name] due to new thrombocytopenia. Given the resolution of one of the focuses on CT there was question if these lesions did represent metastases vs hemorrhages. Multiple specialities, including Oncology, Radiation Oncology, Neurology, and Neurosurgery have weighed in and metastases are suspected after reviewing images. Cardiology has been consulted to determine if patient could receive an MRI but this was deemed incompatible with his pacemaker. His case  was discussed at tumor board today and palliative care was asked to establish goals of care. Dr. Tammi [name] with [name] may pursue SRS depending on [name] discussion. Patient also with new onset pleuritic chest pain today and found to have multiple pulmonary emboli on CTA chest for which he is receiving an [name] filter. He was also noted to have new focal nodular lung opacities on CTA.   Clinical Assessment and Goals of Care: We met with [name] today at bedside. He is a nice gentleman who is very hard of hearing on his right side. 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 He reports he is widowed and that his wife actually died from smoking cigarettes. He states he believes in God but is not necessarily a religious man. He has 2 children and lives with his daughter [name]. We spoke with [name] on the phone and she unfortunately was being evaluated in the ED today but has agreed to meet with [place] tomorrow at 10:30 AM to have a discussion about her father.   [name] reports his chest pain has resolved now after receiving pain medication. He reports poor appetite for the last week and thinks he has lost weight since being in the  hospital. He is able to tell me he is in the hospital due to weakness but unable to give me any other details about his other ongoing medical problems likely partially due to his hearing loss. He denies pain elsewhere. He reports no difficulties with urinating and is having bowel movements every other day.  NEXT OF KIN, daughter [name]    SUMMARY OF RECOMMENDATIONS    Code Status/Advance Care Planning:  Full code   Will have discussion with daughter [name] tomorrow morning at 10:30 to hopefully establish the patient's GOC and code status     Symptom Management:   Pain- change Oxy IR to scheduled 5 mg Q6H. Hold for sedation or decreased respiratory rate. Continue Morphine 2 mg IV Q3H PRN.  Bowels- Continue [name] daily PRN. Will add on  [name] daily QHS  Palliative Prophylaxis:   Bowel Regimen and Frequent Pain Assessment  Psycho-social/Spiritual:   Desire for further Chaplaincy support:yes  Prognosis:   Unable to determine  Discharge Planning: To Be Determined      Primary Diagnoses: Present on Admission: . Hyperglycemia . Hypertension   I have reviewed the medical record, interviewed the patient and family, and examined the patient. The following aspects are pertinent.  Past Medical History:  Diagnosis Date  . Allergy   . Arthritis    degenerative-knees  . Atrial fibrillation ([place]) 1990  . Bladder cancer ([place]) 2010  . Cancer ([place]) 05/05/07-06/15/07   parotid gland/66.4 GY  . Cancer ([place])    left eyelid   . Chronic kidney disease    positive cytology  . Coronary artery disease   . Diabetes mellitus without complication ([place])    meds d/c  2-3 year ago  . Dry eyes   . Dyslipidemia   . Dysrhythmia    HX A FIB  . GERD (gastroesophageal reflux disease)   . Heart murmur    2/6 systolic  . History of chemotherapy     Hx carboplatin/70f  . History of radiation therapy 05/05/07-06/15/07   right parotid through subclavicular region/ mid jugularlymph node chain  . History of radiation therapy 05/05/07-06/15/07   Right parotid/hemineck,supraclavicular  . HOH (hard of hearing)   . Hypertension    labile  . Pacemaker    2003 vent paced , rate 69  . Pneumonia    hx of   . Prostate hypertrophy   . Right groin hernia   . Skin cancer    HX BASAL CELL REMOVED  . [facility]   TIA  . Tinnitus    right   . Urinary tract infection    hx of   . Xerostomia    limited   Social History   Social History  . Marital status: Widowed    Spouse name: N/A  . Number of children: 2  . Years of education: 12   Occupational History  . retired    Social History Main Topics  . Smoking status: Never Smoker  . Smokeless tobacco: Never Used  . Alcohol use 10.5 oz/week    21 Standard drinks  or equivalent per week     Comment: 3 /day( bourbon)  . Drug use: No  . Sexual activity: No   Other Topics Concern  . None   Social History Narrative   Lives at home. [facility]   Caffeine use:    Family History  Problem Relation Age of Onset  . Heart disease Mother   . Cancer Father  Lung cancer  . Emphysema Brother   . Coronary artery disease Son    Scheduled Meds: . amLODipine  10 mg Oral Daily  . dexamethasone  4 mg Oral Daily  . feeding supplement (GLUCERNA SHAKE)  237 mL Oral BID BM  . insulin aspart  0-15 Units Subcutaneous TID WC  . insulin aspart  0-5 Units Subcutaneous QHS  . insulin glargine  10 Units Subcutaneous QHS  . levETIRAcetam  500 mg Oral BID  . oxyCODONE  5 mg Oral Q6H  . pantoprazole  40 mg Oral Daily  . senna-docusate  1 tablet Oral QHS  . sodium chloride flush  3 mL Intravenous Q12H   Continuous Infusions:  PRN Meds:.ALPRAZolam, morphine injection, ondansetron **OR** ondansetron (ZOFRAN) IV, polyethylene glycol, prochlorperazine Medications Prior to Admission:  Prior to Admission medications   Medication Sig Start Date End Date Taking? Authorizing Provider  acetaminophen (TYLENOL) 500 MG tablet Take 500 mg by mouth daily as needed for mild pain.   Yes Historical Provider, MD  ALPRAZolam Duanne Moron) 0.5 MG tablet Take 0.5 mg by mouth at bedtime as needed for sleep.  03/16/16  Yes Historical Provider, MD  amLODipine (NORVASC) 10 MG tablet Take 1 tablet (10 mg total) by mouth daily. 03/05/16  Yes Orson Eva, MD  clopidogrel (PLAVIX) 75 MG tablet Take 1 tablet (75 mg total) by mouth daily. For Afib/TIA--restart 03/07/16 03/07/16  Yes David Tat, MD  dexamethasone ([name]) 4 MG tablet Take 1 tablet (4 mg total) by mouth every 12 (twelve) hours. X 6 days, then 4 mg once daily Patient taking differently: Take 4 mg by mouth daily.  03/04/16  Yes Orson Eva, MD  divalproex ([name]) 500 MG DR tablet Take 1 tablet (500  mg total) by mouth every 12 (twelve) hours. 03/04/16  Yes Orson Eva, MD  insulin aspart (NOVOLOG) 100 UNIT/ML injection Inject 8-10 Units into the skin 2 (two) times daily. Per sliding scale.   Yes Historical Provider, MD  mineral oil-hydrophilic petrolatum (AQUAPHOR) ointment Apply topically 2 (two) times daily. Patient taking differently: Apply 1 application topically daily.  09/09/15  Yes Dinah C Ngetich, NP  pantoprazole (PROTONIX) 40 MG tablet Take 40 mg by mouth daily. For GERD.   Yes Historical Provider, MD  polyethylene glycol ([name] / GLYCOLAX) packet Take 17 g by mouth daily. Patient taking differently: Take 17 g by mouth daily as needed for mild constipation.  09/03/15  Yes Barton Dubois, MD  prochlorperazine (COMPAZINE) 10 MG tablet Take 10 mg by mouth every 6 (six) hours as needed for nausea or vomiting. Reported on 01/10/2016   Yes Historical Provider, MD  oxybutynin (DITROPAN) 5 MG tablet Take 1 tablet (5 mg total) by mouth every 8 (eight) hours as needed for bladder spasms. Patient not taking: Reported on 03/25/2016 09/03/15   Barton Dubois, MD   Allergies  Allergen Reactions  . Codeine Other (See Comments)    Blisters between fingers  . Docetaxel Rash   Review of Systems HOH, not hungry.   no frank complaints.   Denied current pain, constipation, dysuria.     Physical Exam  Vital Signs: BP 102/62 (BP Location: Left Arm)   Pulse 65   Temp 98.1 F (36.7 C) (Oral)   Resp 18   Ht 5' 9"  (1.753 m)   Wt 85.9 kg (189 lb 6 oz)   SpO2 97%   BMI 27.97 kg/m  Pain Assessment: 0-10   Pain Score: 3   General: elderly man sitting up in  bed, NAD, pleasant  HEENT: [place]/AT, EOMI, sclera anicteric, mucus membranes moist CV: RRR, 2/6 systolic murmur heard Pulm: CTA bilaterally Abd: BS+, soft, non-tender Ext: no peripheral edema, venous stasis changes in lower extremities Neuro: alert, decreased hearing on right side, no other focal deficits   SpO2: SpO2: 97 % O2 Device:SpO2: 97  % O2 Flow Rate: .   IO: Intake/output summary:   Intake/Output Summary (Last 24 hours) at 03/30/16 1609 Last data filed at 03/30/16 0948  Gross per 24 hour  Intake              320 ml  Output              600 ml  Net             -280 ml    LBM: Last BM Date: 03/28/16 Baseline Weight: Weight: 86.6 kg (191 lb) Most recent weight: Weight: 85.9 kg (189 lb 6 oz)     Palliative Assessment/Data:   Flowsheet Rows   Flowsheet Row Most Recent Value  Intake Tab  Referral Department  Hospitalist  Unit at Time of Referral  Other (Comment)  Palliative Care Primary Diagnosis  Cancer  Date Notified  03/30/16  Palliative Care Type  New Palliative care  Reason for referral  Clarify Goals of Care, Counsel Regarding Hospice  Date of Admission  03/25/16  Date first seen by Palliative Care  03/30/16  # of days Palliative referral response time  0 Day(s)  # of days IP prior to Palliative referral  5  Clinical Assessment  Palliative Performance Scale Score  30%  Psychosocial & Spiritual Assessment  Palliative Care Outcomes  Patient/Family meeting held?  Yes  Who was at the meeting?  patient.        Time In: 2:00 PM Time Out: 3:10 PM Time Total: 70 minutes Greater than 50%  of this time was spent counseling and coordinating care related to the above assessment and plan.  Signed by: Albin Felling, MD Imogene Burn, PA-C Palliative Medicine   Please contact Palliative Medicine Team phone at [phone] for questions and concerns.  For individual provider: See Shea Evans

## 2016-03-30 NOTE — Progress Notes (Signed)
Patient with CBG=41; asymptomatic; no complaints.He received Dextrose 50%, 50cc per protocol. After the administration, his CBG=127. Will continue to monitor.

## 2016-03-30 NOTE — Consult Note (Signed)
   [facility] CM Inpatient Consult   03/30/2016  [name] 08/04/1929 [id number]   Patient screened for potential [place] Management services for re-admission. Patient is eligible for [facility] Care Management services under patient's Medicare plan.  Primary Care Provider as Dr. Domenick Gong. Chart review reveals patient is [name] an 82 y.o.malewith H/o atrial fibrillation not on anticoagulation, pacemaker- for sinus node dysfunction, mild to mod AS, subdural hematoma, diet controlled diabetes, transitional cell carcinoma with intermittent hematuria, s/p chemotherapy, TIA, HTN was [facility] from [facility] 9[facility]13 to [facility] after long hospitalization for new onset seizures and possible brain metastasis noted on CT head, he was seen by Neurology, Neurosurgery and Oncology, couldn't have an MRI due to pacer.  Patient was discharged from [facility] 9 days ago.  Then readmitted with weakness and failure to thrive.  Will follow for disposition and needs as his current disposition is unclear.   For questions contact:   Natividad Brood, RN BSN [facility] Liaison  [phone] business mobile phone Toll free office [phone]

## 2016-03-30 NOTE — Consult Note (Signed)
Radiation Oncology         [phone] ________________________________  Follow up Inpatient Consultation  Name: [name] MRN: [id number]  Date: 03/30/16 DOB: [birth year]  [id number] [name], MD  No ref. provider found   REFERRING PHYSICIAN: No ref. provider found  DIAGNOSIS: The primary encounter diagnosis was Dehydration. Diagnoses of Hyperglycemia, AKI (acute kidney injury) ([place]), Hyperkalemia, Mass, Hydronephrosis due to obstruction of ureter, Chest pain, PE (pulmonary thromboembolism) ([place]), and Brain lesion were also pertinent to this visit.    ICD-9-CM ICD-10-CM   1. Dehydration 276.51 E86.0   2. Hyperglycemia 790.29 R73.9   3. AKI (acute kidney injury) ([place]) 584.9 N17.9   4. Hyperkalemia 276.7 E87.5   5. Mass 782.2 R22.9 CT HEAD W & WO CONTRAST     CT HEAD W & WO CONTRAST  6. Hydronephrosis due to obstruction of ureter 591 N13.2 US Renal   593.4  US Renal  7. Chest pain 786.50 R07.9 CT ANGIO CHEST PE W OR WO CONTRAST     CT ANGIO CHEST PE W OR WO CONTRAST     CANCELED: CT ANGIO CHEST PE W OR WO CONTRAST     CANCELED: CT ANGIO CHEST PE W OR WO CONTRAST  8. PE (pulmonary thromboembolism) ([name] 415.19 I26.99 IR IVC FILTER PLMT / S&I /IMG GUID/MOD SED     IR IVC FILTER PLMT / S&I /IMG GUID/MOD SED  9. Brain lesion 348.89 G93.9 CT HEAD W & WO CONTRAST     CT HEAD W & WO CONTRAST    HISTORY OF PRESENT ILLNESS: [name] is a 82 y.o. male seen at the request of Dr. Alen Blew and Dr. Paulino Rily for evaluation of brain lesions. On 03/03/16, we were asked to see this patient for an abnormality seen on CT imaging of the brain. The patient presented on 02/28/16 after a fall at home which resulted in confusion, garbled speech, and seizure activity witnessed by his daughter. He has a history of subarachnoid hemorrhage a few years ago and underwent left frontal craniotomy with Dr. Arnoldo Morale. Upon evaluation in the ED on 02/28/16, a noncontrast brain CT revealed  A right frontal  subcortical white matter density with mass effect. A probable right parafalcine subarachnoid hemorrhage was also noted. CT with contrast on 02/28/16 revealed an enhancing mass at the left occipital lobe with surrounding vasogenic edema and stable edema in the right frontal lobe with similar change along the parafalcine region on the right as seen earlier that day on the noncontrast study. A repeat noncontrast study on 02/29/16 revealed stability in both regions, the right frontal, and left occipital region. Unfortunately due to his pacemaker he cannot undergo MRI, and recommendations were made for steroid taper and repeat imaging. He was admitted on 03/25/16 after he developed increasing weakness. He had a CT of the head, which revealed resolution of the right frontal parafalcine hyperdense focus, and left occipital hyperdense focus with uncertain enhancement, stable in comparison to prior. He also had a probable new focus of enhancement within the right paramedial frontal lobe. We are asked to see the patient regarding the etiology of these findings. He also had a CTPA which confirmed multifocial pulmonary emboli and dilated pulmonary arterial tree concerning for pulmonary hypertension without right heart strain. Nondisplaced subacute T5 fracture was noted, and a new multifocal nodular lung opacity concerning for possible infection was also noted.   PREVIOUS RADIATION THERAPY: No  PAST MEDICAL HISTORY:  Past Medical History:  Diagnosis Date  .  Allergy   . Arthritis    degenerative-knees  . Atrial fibrillation ([place]) 1990  . Bladder cancer ([place]) 2010  . Cancer ([place]) 05/05/07-06/15/07   parotid gland/66.4 GY  . Cancer ([place])    left eyelid   . Chronic kidney disease    positive cytology  . Coronary artery disease   . Diabetes mellitus without complication ([place])    meds d/c  2-3 year ago  . Dry eyes   . Dyslipidemia   . Dysrhythmia    HX A FIB  . GERD (gastroesophageal reflux disease)   . Heart  murmur    2/6 systolic  . History of chemotherapy     Hx carboplatin/41fu  . History of radiation therapy 05/05/07-06/15/07   right parotid through subclavicular region/ mid jugularlymph node chain  . History of radiation therapy 05/05/07-06/15/07   Right parotid/hemineck,supraclavicular  . HOH (hard of hearing)   . Hypertension    labile  . Pacemaker    2003 vent paced , rate 69  . Pneumonia    hx of   . Prostate hypertrophy   . Right groin hernia   . Skin cancer    HX BASAL CELL REMOVED  . Stroke [facility]) 2003   TIA  . Tinnitus    right   . Urinary tract infection    hx of   . Xerostomia    limited      PAST SURGICAL HISTORY: Past Surgical History:  Procedure Laterality Date  . BACK SURGERY    . CARDIAC CATHETERIZATION  09/20/2012   EF 50-55%, moderately calcified aortic valve leaflets, mild-moderate aortic valve stenosis, LA-appendage moderately dilated, moderate regurg of the mitral valve, RA moderately dilated  . CARDIOVASCULAR STRESS TEST  09/05/2009   Pharmalogical stress test w/o chest pain or EKG changes for ischemia  . CRANIOTOMY  12/14/2011   Procedure: CRANIOTOMY HEMATOMA EVACUATION SUBDURAL;  Surgeon: Ophelia Charter, MD;  Location: [facility] [facility];  Service: Neurosurgery;  Laterality: Left;  LEFT Craniotomy for subdural   . CYSTOSCOPY  01/2011   neg  . CYSTOSCOPY W/ RETROGRADES  04/01/2012   Procedure: CYSTOSCOPY WITH RETROGRADE PYELOGRAM;  Surgeon: Claybon Jabs, MD;  [facility];  Service: Urology;  Laterality: Bilateral;  . CYSTOSCOPY WITH BIOPSY  04/01/2012   Procedure: CYSTOSCOPY WITH BIOPSY;  Surgeon: Claybon Jabs, MD;  Location: [facility];  Service: Urology;  Laterality: N/A;  BLADDER BIOPSIES and bladder washings  . CYSTOSCOPY WITH BIOPSY  05/02/2012   Procedure: CYSTOSCOPY WITH BIOPSY;  Surgeon: Claybon Jabs, MD;  Location: [facility];  Service: Urology;  Laterality: N/A;  . CYSTOSCOPY WITH  STENT PLACEMENT  05/02/2012   Procedure: CYSTOSCOPY WITH STENT PLACEMENT;  Surgeon: Claybon Jabs, MD;  [facility];  Service: Urology;  Laterality: Bilateral;  . CYSTOSCOPY WITH URETEROSCOPY AND STENT PLACEMENT Right 02/15/2015   Procedure: CYSTOSCOPY WITH RIGHT URETEROSCOPY, RENAL PELVIC WASHINGS, STENT, RETROGRADE;  Surgeon: Kathie Rhodes, MD;  Location: [facility];  Service: Urology;  Laterality: Right;  . CYSTOSCOPY/RETROGRADE/URETEROSCOPY  05/02/2012   Procedure: CYSTOSCOPY/RETROGRADE/URETEROSCOPY;  Surgeon: Claybon Jabs, MD;  Location: [facility];  Service: Urology;  Laterality: Bilateral;  CYSTOSCOPY BILATERAL RETROGRADE PYLOGRAM BILATERAL URETEROSCOPY AND BIOPSY    . CYSTOSCOPY/RETROGRADE/URETEROSCOPY Bilateral 01/21/2015   Procedure: CYSTOSCOPY/RETROGRADE/ BLADDER BIOPSY;  Surgeon: Kathie Rhodes, MD;  Location: [facility];  Service: Urology;  Laterality: Bilateral;  . LOWER EXTREMITY ARTERIAL DOPPLER  09/28/2011   No  evidence of arterial insufficiency.   . [place]  . PACEMAKER GENERATOR CHANGE  05/23/2002   [facility], [place], serial [phone]  . PACEMAKER GENERATOR CHANGE N/A 09/01/2013   Procedure: PACEMAKER GENERATOR CHANGE;  Surgeon: Sanda Klein, MD;  Location: [place] [facility];  Service: Cardiovascular;  Laterality: N/A;  . [place]   last changed 2003  . PAROTIDECTOMY     right  . URETEROSCOPY  04/01/2012   Procedure: URETEROSCOPY;  Surgeon: Claybon Jabs, MD;  Location: [facility];  Service: Urology;  Laterality: Right;    FAMILY HISTORY:  Family History  Problem Relation Age of Onset  . Heart disease Mother   . Cancer Father     Lung cancer  . Emphysema Brother   . Coronary artery disease Son     SOCIAL HISTORY:  Social History   Social History  . Marital status: Widowed    Spouse name: N/A  . Number of children: 2  . Years of education: 12   Occupational  History  . retired    Social History Main Topics  . Smoking status: Never Smoker  . Smokeless tobacco: Never Used  . Alcohol use 10.5 oz/week    21 Standard drinks or equivalent per week     Comment: 3 /day( bourbon)  . Drug use: No  . Sexual activity: No   Other Topics Concern  . Not on file   Social History Narrative   Lives at home. Got released from [place] home yesterday (03/17/16)   Caffeine use:     ALLERGIES: Codeine and Docetaxel  MEDICATIONS:  Current Facility-Administered Medications  Medication Dose Route Frequency Provider Last Rate Last Dose  . ALPRAZolam Duanne Moron) tablet 0.5 mg  0.5 mg Oral QHS PRN Waldemar Dickens, MD      . amLODipine (NORVASC) tablet 10 mg  10 mg Oral Daily Waldemar Dickens, MD   10 mg at 03/30/16 0945  . dexamethasone (DECADRON) tablet 4 mg  4 mg Oral Daily Waldemar Dickens, MD   4 mg at 03/30/16 0945  . dextrose 50 % solution           . feeding supplement (GLUCERNA SHAKE) (GLUCERNA SHAKE) liquid 237 mL  237 mL Oral BID BM Domenic Polite, MD   237 mL at 03/30/16 0944  . insulin aspart (novoLOG) injection 0-15 Units  0-15 Units Subcutaneous TID WC Domenic Polite, MD   Stopped at 03/30/16 1700  . insulin aspart (novoLOG) injection 0-5 Units  0-5 Units Subcutaneous QHS Domenic Polite, MD   3 Units at 03/29/16 2201  . insulin glargine (LANTUS) injection 10 Units  10 Units Subcutaneous QHS Domenic Polite, MD   10 Units at 03/29/16 2202  . levETIRAcetam (KEPPRA) tablet 500 mg  500 mg Oral BID Domenic Polite, MD   500 mg at 03/30/16 0945  . morphine 2 MG/ML injection 2 mg  2 mg Intravenous Q3H PRN Domenic Polite, MD   2 mg at 03/30/16 1231  . ondansetron (ZOFRAN) tablet 4 mg  4 mg Oral Q6H PRN Waldemar Dickens, MD       Or  . ondansetron [facility]) injection 4 mg  4 mg Intravenous Q6H PRN Waldemar Dickens, MD      . oxyCODONE (Oxy IR/ROXICODONE) immediate release tablet 5 mg  5 mg Oral Q6H Juliet Rude, MD   5 mg at 03/30/16 1637  . pantoprazole  (PROTONIX) EC tablet 40 mg  40 mg Oral Daily Waldemar Dickens, MD   40 mg at 03/30/16 0944  . polyethylene glycol (MIRALAX / GLYCOLAX) packet 17 g  17 g Oral Daily PRN Waldemar Dickens, MD      . prochlorperazine (COMPAZINE) tablet 10 mg  10 mg Oral Q6H PRN Waldemar Dickens, MD      . senna-docusate (Senokot-S) tablet 1 tablet  1 tablet Oral QHS Juliet Rude, MD      . sodium chloride flush (NS) 0.9 % injection 3 mL  3 mL Intravenous Q12H Waldemar Dickens, MD   3 mL at 03/30/16 1000    REVIEW OF SYSTEMS:  On review of systems, the patient reports that hea is doing well overall. He denies any chest pain, shortness of breath, cough, fevers, chills, night sweats, unintended weight changes. He reports significant fatigue and weakness, and denies any bowel or bladder disturbances, and denies abdominal pain, nausea or vomiting. He denies any new musculoskeletal or joint aches or pains. A complete review of systems is obtained and is otherwise negative.    PHYSICAL EXAM:  height is 5\' 9"  (1.753 m) and weight is 189 lb 6 oz (85.9 kg). His oral temperature is 98.1 F (36.7 C). His blood pressure is 102/62 and his pulse is 65. His respiration is 18 and oxygen saturation is 97%.   Pain Scale 0/10 In general this is a elderly chronically ill appearing caucasian male in no acute distress. He is alert and oriented x4 and appropriate throughout the examination. HEENT reveals that the patient is normocephalic, atraumatic. EOMs are intact. PERRLA. Skin is intact without any evidence of gross lesions.  Lower extremities are negative for pretibial pitting edema, deep calf tenderness, cyanosis or clubbing.   KPS = 30  100 - Normal; no complaints; no evidence of disease. 90   - Able to carry on normal activity; minor signs or symptoms of disease. 80   - Normal activity with effort; some signs or symptoms of disease. 55   - Cares for self; unable to carry on normal activity or to do active work. 60   - Requires  occasional assistance, but is able to care for most of his personal needs. 50   - Requires considerable assistance and frequent medical care. 97   - Disabled; requires special care and assistance. 71   - Severely disabled; hospital admission is indicated although death not imminent. 79   - Very sick; hospital admission necessary; active supportive treatment necessary. 10   - Moribund; fatal processes progressing rapidly. 0     - Dead  Karnofsky DA, Abelmann [place], Craver LS and Burchenal [facility] [phone]) The use of the nitrogen mustards in the palliative treatment of carcinoma: with particular reference to bronchogenic carcinoma Cancer 1 634-56  LABORATORY DATA:  Lab Results  Component Value Date   WBC 14.9 (H) 03/30/2016   HGB 14.8 03/30/2016   HCT 41.8 03/30/2016   MCV 102.0 (H) 03/30/2016   PLT 73 (L) 03/30/2016   Lab Results  Component Value Date   NA 132 (L) 03/30/2016   K 4.5 03/30/2016   CL 92 (L) 03/30/2016   CO2 30 03/30/2016   Lab Results  Component Value Date   ALT 14 03/17/2016   AST 15 03/17/2016   ALKPHOS 83 03/17/2016   BILITOT 0.9 03/17/2016     RADIOGRAPHY: Ct Head W & Wo Contrast  Result Date: 03/25/2016 CLINICAL DATA:  82 y/o  M; weakness. EXAM: CT HEAD WITHOUT  AND WITH CONTRAST TECHNIQUE: Contiguous axial images were obtained from the base of the skull through the vertex without and with intravenous contrast CONTRAST:  27mL ISOVUE-300 IOPAMIDOL (ISOVUE-300) INJECTION 61%<Contrast>53mL ISOVUE-300 IOPAMIDOL (ISOVUE-300) INJECTION 61% COMPARISON:  CT of head with contrast dated 02/28/2016 FINDINGS: Brain: Stable left inferior frontal encephalomalacia. Stable hyperdense focus in the left occipital lobe measuring 10 mm (series 2, image 14). Postcontrast administration the lesion appears to enhance only on the coronal reconstruction (compare series 8, image 33 with series 6, image 54). Additionally, there is a probable new focus of enhancement in the right paramedian frontal  lobe (series 4, image 15) No evidence for large territory infarct or new intracranial hemorrhage. Stable mild to moderate chronic microvascular ischemic changes and parenchymal volume loss. Vascular: Calcific atherosclerosis of carotid siphons. Skull: Postsurgical changes related to a left frontal craniotomy are stable. All Sinuses/Orbits: Right mastoid opacification with sclerosis compatible with sequelae of chronic otomastoiditis, stable. Mild paranasal sinus disease with sphenoid and ethmoid patchy opacification. Left mastoid air cells are clear. Orbits are unremarkable. Other: Right parotidectomy. IMPRESSION: 1. No evidence of large territory infarct or new intracranial hemorrhage. 2. Probable new focus of enhancement within the right paramedian frontal lobe. Differential includes metastatic disease or subacute infarction. 3. Previous identified by right frontal parafalcine hyperdense focus is no longer appreciated, possibly having represented an area of hemorrhage. 4. Left occipital hyperdense focus with uncertain enhancement. Stable in size in comparison with prior studies. Electronically Signed   By: Kristine Garbe M.D.   On: 03/25/2016 17:29   Ct Soft Tissue Neck W Contrast  Result Date: 03/01/2016 CLINICAL DATA:  History of bladder cancer. Altered mental status. Suspected brain metastasis of unknown origin. EXAM: CT NECK WITH CONTRAST TECHNIQUE: Multidetector CT imaging of the neck was performed using the standard protocol following the bolus administration of intravenous contrast. CONTRAST:  162mL ISOVUE-300 IOPAMIDOL (ISOVUE-300) INJECTION (administered for both neck and body imaging. COMPARISON:  None. FINDINGS: Pharynx and larynx: No suspicious asymmetry or enhancement. Salivary glands: Parotidectomy on the right. Flat scar-like opacity in the resection bed without concerning nodule. Abnormal right submandibular gland, favor fatty atrophy over excision. Thyroid: Negative Lymph nodes: None  enlarged or abnormal density. Vascular: Atheromatous changes with patent major vessels. Porta catheter into the right internal jugular vein. Limited intracranial: Known 10 to 11 mm high-density masslike area in the left occipital lobe is unchanged compared to previous. Visualized orbits: Negative where seen Mastoids and visualized paranasal sinuses: Chronic right mastoiditis and middle ear opacification. Question if this is related to previous radiotherapy. Skeleton: Degenerative changes.  No acute or aggressive process. Upper chest: Reported separately IMPRESSION: 1. No evidence of malignancy in the neck. 2. Right parotidectomy. 3. Chronic right otomastoiditis. Electronically Signed   By: Monte Fantasia M.D.   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 Additional history of urothelial carcinoma treated with chemotherapy, most recently in June 2017. EXAM: CT CHEST, ABDOMEN, AND PELVIS WITH CONTRAST TECHNIQUE: Multidetector CT imaging of the chest, abdomen and pelvis was performed following the standard protocol during bolus administration of intravenous contrast. CONTRAST:  163mL ISOVUE-300 IOPAMIDOL (ISOVUE-300) INJECTION 61% COMPARISON:  12/1915 CT chest, abdomen and pelvis. FINDINGS: Motion degraded scan. CT CHEST FINDINGS Cardiovascular: Stable top-normal heart size. No significant pericardial fluid/thickening. Right internal  jugular MediPort terminates at the cavoatrial junction. Stable configuration of 3 lead left subclavian pacemaker with lead tips in the right atrium and right ventricle. Left anterior descending and left circumflex coronary atherosclerosis. Atherosclerotic nonaneurysmal thoracic aorta. Stable dilated main pulmonary artery (3.7 cm diameter). No central  pulmonary emboli. Mediastinum/Nodes: No discrete thyroid nodules. Unremarkable esophagus. No pathologically enlarged axillary, mediastinal or hilar lymph nodes. Lungs/Pleura: No pneumothorax. Trace layering bilateral pleural effusions. There are stable scattered calcified subcentimeter granulomas in both lungs. There is mild compressive atelectasis in the dependent bilateral lower lobes. No acute consolidative airspace disease, lung masses or new significant pulmonary nodules in the aerated portions of the lungs. Musculoskeletal: No aggressive appearing focal osseous lesions. Moderate thoracic spondylosis. Healed deformities in the left posterior ribs. CT ABDOMEN PELVIS FINDINGS Hepatobiliary: Normal liver with no liver mass. Gallbladder is filled with calcified gas-filled gallstones, with no gallbladder wall thickening or pericholecystic fluid. No biliary ductal dilatation. Pancreas: Normal, with no mass or duct dilation. Spleen: Normal size. No mass. Adrenals/Urinary Tract: Normal adrenals. There is new mild right hydroureteronephrosis to the level of the proximal right ureter. No stone or gross obstructing mass is seen in the right urinary tract. No left hydronephrosis. Simple 1.2 cm posterior interpolar left renal cyst. No additional renal lesions. Collapsed bladder with stable chronic diffuse bladder wall thickening. Stomach/Bowel: Grossly normal stomach. Normal caliber small bowel with no small bowel wall thickening. Normal appendix. Diffuse colonic diverticulosis, with no large bowel wall thickening or pericolonic fat stranding. Vascular/Lymphatic: Atherosclerotic nonaneurysmal abdominal aorta. Patent portal, splenic, hepatic and renal veins. New mildly enlarged 1.0 cm aortocaval node (series 3/ image 63). No additional pathologically enlarged abdominopelvic nodes. Reproductive: Normal size prostate. Other: No pneumoperitoneum, ascites or focal fluid collection. Moderate fat containing right inguinal hernia,  stable. Musculoskeletal: No aggressive appearing focal osseous lesions. Moderate lumbar spondylosis. IMPRESSION: 1. Limited motion degraded scan. 2. New mild right hydroureteronephrosis to the level of the proximal right ureter. No stones or obvious obstructing mass is seen in this location. A urothelial neoplasm cannot be excluded in the right proximal ureter. Urology consultation advised. Consider correlation with retrograde pyelogram and/or short-term follow-up outpatient hematuria protocol CT abdomen/pelvis without and with IV contrast when the patient is able to remain still. 3. New mildly enlarged aortocaval retroperitoneal node, nonspecific, nodal metastasis not excluded. 4. No evidence of metastatic disease in the chest. 5. Additional findings include trace layering bilateral pleural effusions, aortic atherosclerosis, coronary atherosclerosis, stable dilated main pulmonary artery suggesting chronic pulmonary hypertension, cholelithiasis, diffuse colonic diverticulosis, stable chronic mild diffuse bladder wall thickening and moderate fat containing right inguinal hernia. Electronically Signed   By: Ilona Sorrel M.D.   On: 03/01/2016 18:31   Ct Angio Chest Pe W Or Wo Contrast  Result Date: 03/30/2016 CLINICAL DATA:  Chest pain and shortness of breath. EXAM: CT ANGIOGRAPHY CHEST WITH CONTRAST TECHNIQUE: Multidetector CT imaging of the chest was performed using the standard protocol during bolus administration of intravenous contrast. Multiplanar CT image reconstructions and MIPs were obtained to evaluate the vascular anatomy. CONTRAST:  100 cc Isovue 370 intravenous COMPARISON:  03/01/2016 FINDINGS: Cardiovascular: Study is optimized for the pulmonary arteries and is good quality. There are multiple pulmonary artery filling defects bilaterally, including subsegmental branches in the left lower lobe (6:112 and 134) and in the right middle and lower lobes. Some of these emboli appear acute, especially in the  right lower lobe anterior basilar segment 6:149, but elsewhere there are subacute to chronic features, especially proximal  segmental branches of the right lower lobe 6:126. Normal RV to LV ratio of 60%. [place] in the right ventricle are prominent, without convincing superimposed thrombus. Cardiomegaly. No pericardial effusion. Single chamber pacer into the right ventricular apex. Atherosclerotic calcification, including the coronary arteries. Mild aortic valve calcification. Mediastinum/Nodes: Negative for adenopathy. Lungs/Pleura: New apical predominant nodular opacities with indistinct appearance. Dominant nodule in the left upper lobe, 5:15, measuring up to 12 mm. Chronic microcalcification or aspirated barium in the dependent right lower lobe Upper Abdomen: Cholelithiasis.  No acute finding. Musculoskeletal: T5 vertebral body fracture that is oblique. The fracture is subacute based on previous study, new from 11/05/2015. Fracture is nondisplaced and does not appear to continue through the posterior elements. There is diffuse ankylosis of the thoracic spine. Critical Value/emergent results were called by telephone at the time of interpretation on 03/30/2016 at 12:31 pm to Dr. Domenic Polite , who verbally acknowledged these results. Review of the MIP images confirms the above findings. IMPRESSION: 1. Multifocal pulmonary emboli ranging from acute to subacute/chronic, segmental to subsegmental size. Dilated pulmonary arterial tree suggest pulmonary hypertension. No indication of acute right heart strain. 2. Nondisplaced subacute T5 body fracture without visible continuation through the posterior elements. 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 3. New multi focal nodular lung opacities. Ill-defined appearance and development over 1 month suggests atypical, potentially hematogenous infection. Metastases are not excluded and close follow-up is recommended in  this patient with urothelial cancer. Electronically Signed   By: Monte Fantasia M.D.   On: 03/30/2016 12:32   Ct Abdomen Pelvis W Contrast  Result Date: 03/01/2016 CLINICAL DATA:  82 year old male inpatient with reported history of new brain metastasis of uncertain origin. 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 Additional history of urothelial carcinoma treated with chemotherapy, most recently in June 2017. EXAM: CT CHEST, ABDOMEN, AND PELVIS WITH CONTRAST TECHNIQUE: Multidetector CT imaging of the chest, abdomen and pelvis was performed following the standard protocol during bolus administration of intravenous contrast. CONTRAST:  183mL ISOVUE-300 IOPAMIDOL (ISOVUE-300) INJECTION 61% COMPARISON:  12/1915 CT chest, abdomen and pelvis. FINDINGS: Motion degraded scan. CT CHEST FINDINGS Cardiovascular: Stable top-normal heart size. No significant pericardial fluid/thickening. Right internal jugular MediPort terminates at the cavoatrial junction. Stable configuration of 3 lead left subclavian pacemaker with lead tips in the right atrium and right ventricle. Left anterior descending and left circumflex coronary atherosclerosis. Atherosclerotic nonaneurysmal thoracic aorta. Stable dilated main pulmonary artery (3.7 cm diameter). No central pulmonary emboli. Mediastinum/Nodes: No discrete thyroid nodules. Unremarkable esophagus. No pathologically enlarged axillary, mediastinal or hilar lymph nodes. Lungs/Pleura: No pneumothorax. Trace layering bilateral pleural effusions. There are stable scattered calcified subcentimeter granulomas in both lungs. There is mild compressive atelectasis in the dependent bilateral lower lobes. No acute consolidative airspace disease, lung masses or new significant pulmonary nodules in the aerated portions of the lungs. Musculoskeletal: No aggressive appearing focal osseous lesions. Moderate thoracic spondylosis. Healed  deformities in the left posterior ribs. CT ABDOMEN PELVIS FINDINGS Hepatobiliary: Normal liver with no liver mass. Gallbladder is filled with calcified gas-filled gallstones, with no gallbladder wall thickening or pericholecystic fluid. No biliary ductal dilatation. Pancreas: Normal, with no mass or duct dilation. Spleen: Normal size. No mass. Adrenals/Urinary Tract: Normal adrenals. There is new mild right hydroureteronephrosis to the level of the proximal right ureter. No stone or gross obstructing mass is seen in the right urinary tract. No left hydronephrosis. Simple 1.2 cm posterior interpolar  left renal cyst. No additional renal lesions. Collapsed bladder with stable chronic diffuse bladder wall thickening. Stomach/Bowel: Grossly normal stomach. Normal caliber small bowel with no small bowel wall thickening. Normal appendix. Diffuse colonic diverticulosis, with no large bowel wall thickening or pericolonic fat stranding. Vascular/Lymphatic: Atherosclerotic nonaneurysmal abdominal aorta. Patent portal, splenic, hepatic and renal veins. New mildly enlarged 1.0 cm aortocaval node (series 3/ image 63). No additional pathologically enlarged abdominopelvic nodes. Reproductive: Normal size prostate. Other: No pneumoperitoneum, ascites or focal fluid collection. Moderate fat containing right inguinal hernia, stable. Musculoskeletal: No aggressive appearing focal osseous lesions. Moderate lumbar spondylosis. IMPRESSION: 1. Limited motion degraded scan. 2. New mild right hydroureteronephrosis to the level of the proximal right ureter. No stones or obvious obstructing mass is seen in this location. A urothelial neoplasm cannot be excluded in the right proximal ureter. Urology consultation advised. Consider correlation with retrograde pyelogram and/or short-term follow-up outpatient hematuria protocol CT abdomen/pelvis without and with IV contrast when the patient is able to remain still. 3. New mildly enlarged aortocaval  retroperitoneal node, nonspecific, nodal metastasis not excluded. 4. No evidence of metastatic disease in the chest. 5. Additional findings include trace layering bilateral pleural effusions, aortic atherosclerosis, coronary atherosclerosis, stable dilated main pulmonary artery suggesting chronic pulmonary hypertension, cholelithiasis, diffuse colonic diverticulosis, stable chronic mild diffuse bladder wall thickening and moderate fat containing right inguinal hernia. Electronically Signed   By: Ilona Sorrel M.D.   On: 03/01/2016 18:31   US Renal  Result Date: 03/25/2016 CLINICAL DATA:  Hydronephrosis due to obstruction of the ureter. Patient with history of urothelial carcinoma treated with chemotherapy most recently November 27, 2015. Noted to have mild right hydroureteronephrosis to the level of the proximal right ureter on prior CT of 03/01/2016. EXAM: RENAL / URINARY TRACT ULTRASOUND COMPLETE COMPARISON:  CT 03/01/2016 FINDINGS: Right Kidney: Length: 9.7 cm. Echogenicity within normal limits. No mass or hydronephrosis visualized. Cortical - medullary distinction is maintained. There is fullness of the right renal collecting system likely representing mild hydronephrosis (images 16 and 18). A hydroureter however was not visualized. Left Kidney: Length: 10 cm. Echogenicity within normal limits. No mass or hydronephrosis visualized. Bladder: Physiologically distended. Only a left-sided ureteral jet could be confirmed despite several minutes of imaging per discussion with ultrasound technologist. IMPRESSION: Mild right-sided hydronephrosis. Absent right ureteral jet suggests continued obstruction. Patent left ureter. Electronically Signed   By: Ashley Royalty M.D.   On: 03/25/2016 17:51      IMPRESSION/PLAN: 1. Brain lesions. The patient's case was again discussed at our multidisciplinary brain conference and unfortunately the patient is unable to undergo an MRI due to his pacemaker. Rather we would recommend a  repeat CT with SRS protocol to determine additional details which might help [place] in the recommendations for how to manage his care. I have communicated this with the patient's daughter, and we are in support of palliative care consult as well to help with goals of care. We will follow along and review results of his CT when available. 2. Pulmonary Embolism. The patient has been found to be a poor candidate for anticoagulation per internal medicine/neurology. We discussed the rationale for IVC filter, and IR will contact the patient's daughter regarding this as well.    Carola Rhine, PAC

## 2016-03-30 NOTE — Progress Notes (Signed)
VASCULAR LAB PRELIMINARY  PRELIMINARY  PRELIMINARY  PRELIMINARY  Bilateral lower extremity venous duplex completed.    Preliminary report:  There is acute DVT noted in the right posterior tibial and peroneal veins and age indeterminate DVT noted in the right femoral, popliteal, peroneal, and posterior tibial veins.   Gave report to [place], RN  [name], [place], RVT 03/30/2016, 4:19 PM

## 2016-03-30 NOTE — Progress Notes (Signed)
PROGRESS NOTE    [name]  [id number] DOB: 24-Nov-1929 DOA: 03/25/2016 PCP: Gabriel Pao, MD Brief Narrative:[name] is an 82 y.o. male with H/o atrial fibrillation not on anticoagulation, pacemaker- for sinus node dysfunction, mild to mod AS, subdural hematoma, diet controlled diabetes, transitional cell carcinoma with intermittent hematuria, s/p chemotherapy, TIA, HTN was discharged from [facility] 9/13 to [facility] for rehab after long hospitalization for new onset seizures and possible brain metastasis noted on CT head, he was seen by Neurology, NEurosurgery and Oncology, couldn't have an MRI due to pacer. Finally plan was for FU with GabrielShadad in Nov with plan for repeat CT with SRS protocol. Patient was discharged from [facility] 9 days ago.  Then readmitted with weakness and failure to thrive, noted to have AKI, creatinine of 1.9 from baseline of 1, NA was 126, K was 6.2 CT head with Probable new focus of enhancement within the right paramedian frontal lobe,previously identified by right frontal parafalcine hyperdense focus is no longer appreciated,, Left occipital hyperdense focus with uncertain enhancement. Neurology, Rad Onc and Oncology following, unable to have MRI Palliative consulted 10/9: pleuritic chest pain-CTA chest ordered  Assessment & Plan:  Generalized weakness: Likely multifactorial including dehydration, metabolic derangement, polypharmacy Depakote, progressive metastatic disease. - Pt/OT - anticipate discharge back to SNF/Rehab - Depakote level is 46, since he is elderly, will not change dose - cut down IVF - improving, plan for SNF, when stable  Brain lesions/Seizures: Noted  last admission. Suspected to have possible metastatic R frontal and 20mm L occipital lobe brain lesion. Pt followed by Neurosurgery, NEurology, and Oncology.  -Per review of DC note from last admission :""Abnormal Brain CT. The patient's case was presented at  multidisciplinary brain oncology conference  including neuroradiology, three neurosurgeons, and two radiation oncologists. In reviewing the imaging (limited by the fact we cannot recommend MRI), the patient's anomalies enchance on non-contrasted studies, though not on the contrasted images. At the conclusion of the discussion, recommendations were made to repeat a CT with and without contrast with SRS protocol in 2 months. Brain navigator will coordinate this with the patient and family, and Dr. Alen [name] will continue to follow him for his history of bladder cancer." -Pt was started on Decadron and Depakote. - continue Decadron, changed depakote to keppra due to worsening thrombocytopenia, d/w GabrielKirkpatrick today who felt that this was an appropriate substitution - Neuro following, GabrielShadad and Rad Onc GabrielManning consulted, unable to definitively prove mets vs hemorrhages at this time  - discussed with Neurology GabrielTurk again, he called and reviewed images with 2 neuroradiologists and they feel these are likely malignant lesions. GabrielTurk d/w GabrielManning 10/6 -GabrielManning recommended eval for possible MRI-since pacer felt to be MRI conditional -d/w Cards GabrielCroitoru, who reviewed pacer leads and not safe for MRIs -patients case was discussed further at tumor board today, Palliative consult requested and GabrielManning will FU with daughter to decide regarding SRS  Pleuritic chest pain 10/9 am -concerning for PE, CTA chest ordered, will FU -has only been on SCDs for DVT prophylaxis due to thrombocytopenia -will not be a safe anticoagulation candidate due to brain lesions, will need IVC filter if CTA positive -also check dopplers  AKI with hyperkalemia and hyponatremia -improved, stopped IVF  Diabetes: likely steroid induced.  - H/o Pre-DM as last A1c 5.5 - Hyperglycemia in 300s yesterday, started lantus and meal coverage now with hypoglycemia, cut down lantus and stopped meal coverage,  SSI -CBGs more   stable now  Recent hydronephrosis due to possible transitional cell carcinoma of R ureter: noted during previous admission - Renal US with mild hydronephrosis only -creatinine improved  Thrombocytopenia -heparin on hold immediately after admission, got 2 doses on day of admission for DVT prophylaxis then stopped due to drop in platelet counts -baseline platelets from last admission in 100-110K range -HIT panel negative -could be worsened due to depakote, hence changed this to [place] yesterday  HTN: - continue norvasc  H/o Stroke/CAD: - continue plavix,   GERD: - continue ppi   ETOH abuse: - No further ETOH since previous admission  ANxiety: - continue xanax prn  DVT prophylaxis:SCDs due to low platelets Code Status:Full Code Family Communication:No family at bedside, d/w daughter 10/5, 10/7 and 10/9 Disposition Plan:needs SNF-when stable     Subjective: Having R sided chest pain since this am  Objective: Vitals:   03/29/16 0527 03/29/16 1529 03/29/16 2143 03/30/16 0550  BP: 124/68 112/65 116/76 117/69  Pulse: 63 62 61 (!) 57  Resp: 19 16 18 18   Temp: 98.1 F (36.7 C) 97.5 F (36.4 C)  97.4 F (36.3 C)  TempSrc:  Oral    SpO2: 98% 97% 100% 92%  Weight:    85.9 kg (189 lb 6 oz)  Height:        Intake/Output Summary (Last 24 hours) at 03/30/16 1011 Last data filed at 03/30/16 0948  Gross per 24 hour  Intake              320 ml  Output              600 ml  Net             -280 ml   Filed Weights   03/25/16 1022 03/25/16 2036 03/30/16 0550  Weight: 86.6 kg (191 lb) 79.3 kg (174 lb 14.4 oz) 85.9 kg (189 lb 6 oz)    Examination:  General exam: Appears calm and comfortable  Respiratory system: Clear to auscultation. Respiratory effort normal. Cardiovascular system: S1 & S2 heard, RRR. No JVD, murmurs, rubs, gallops or clicks. No pedal edema. Gastrointestinal system: Abdomen is nondistended, soft and nontender. No organomegaly or masses felt. Normal  bowel sounds heard. Central nervous system: Alert and oriented. No focal neurological deficits. Extremities: Symmetric 5 x 5 power. Skin: No rashes, lesions or ulcers Psychiatry: Judgement and insight appear normal. Mood & affect appropriate.     Data Reviewed: I have personally reviewed following labs and imaging studies  CBC:  Recent Labs Lab 03/25/16 1027 03/26/16 0634 03/27/16 0631 03/29/16 0511 03/30/16 0530  WBC 12.5* 10.8* 9.6 10.0 14.9*  NEUTROABS 11.7*  --   --   --   --   HGB 15.5 14.6 13.8 13.0 14.8  HCT 42.5 41.6 39.1 37.3* 41.8  MCV 101.2* 102.5* 102.4* 103.0* 102.0*  PLT 85* 67* 53* 50* 73*   Basic Metabolic Panel:  Recent Labs Lab 03/25/16 1903 03/26/16 0634 03/27/16 0631 03/29/16 0511 03/30/16 0530  NA 130* 132* 134* 131* 132*  K 4.9 5.2* 4.7 4.9 4.5  CL 94* 92* 97* 96* 92*  CO2 26 28 29 29 30   GLUCOSE 248* 230* 48* 168* 118*  BUN 59* 42* 30* 37* 31*  CREATININE 1.39* 1.13 0.90 1.20 0.97  CALCIUM 9.0 8.7* 8.6* 8.6* 8.7*   GFR: Estimated Creatinine Clearance: 61.6 mL/min (by C-G formula based on SCr of 0.97 mg/dL). Liver Function Tests: No results for input(s): AST, ALT, ALKPHOS, BILITOT, PROT, ALBUMIN in the last 168  hours. No results for input(s): LIPASE, AMYLASE in the last 168 hours. No results for input(s): AMMONIA in the last 168 hours. Coagulation Profile: No results for input(s): INR, PROTIME in the last 168 hours. Cardiac Enzymes: No results for input(s): CKTOTAL, CKMB, CKMBINDEX, TROPONINI in the last 168 hours. BNP (last 3 results) No results for input(s): PROBNP in the last 8760 hours. HbA1C: No results for input(s): HGBA1C in the last 72 hours. CBG:  Recent Labs Lab 03/29/16 0816 03/29/16 1202 03/29/16 1651 03/29/16 2139 03/30/16 0802  GLUCAP 146* 273* 349* 285* 95   Lipid Profile: No results for input(s): CHOL, HDL, LDLCALC, TRIG, CHOLHDL, LDLDIRECT in the last 72 hours. Thyroid Function Tests: No results for  input(s): TSH, T4TOTAL, FREET4, T3FREE, THYROIDAB in the last 72 hours. Anemia Panel: No results for input(s): VITAMINB12, FOLATE, FERRITIN, TIBC, IRON, RETICCTPCT in the last 72 hours. Urine analysis:    Component Value Date/Time   COLORURINE YELLOW 03/25/2016 1200   APPEARANCEUR CLEAR 03/25/2016 1200   LABSPEC 1.028 03/25/2016 1200   PHURINE 5.5 03/25/2016 1200   GLUCOSEU >1000 (A) 03/25/2016 1200   HGBUR SMALL (A) 03/25/2016 1200   BILIRUBINUR NEGATIVE 03/25/2016 1200   KETONESUR NEGATIVE 03/25/2016 1200   PROTEINUR NEGATIVE 03/25/2016 1200   UROBILINOGEN 1.0 04/03/2012 1018   NITRITE NEGATIVE 03/25/2016 1200   LEUKOCYTESUR NEGATIVE 03/25/2016 1200   Sepsis Labs: @LABRCNTIP (procalcitonin:4,lacticidven:4)  )No results found for this or any previous visit (from the past 240 hour(s)).       Radiology Studies: No results found.      Scheduled Meds: . amLODipine  10 mg Oral Daily  . clopidogrel  75 mg Oral Daily  . dexamethasone  4 mg Oral Daily  . feeding supplement (GLUCERNA SHAKE)  237 mL Oral BID BM  . insulin aspart  0-15 Units Subcutaneous TID WC  . insulin aspart  0-5 Units Subcutaneous QHS  . insulin glargine  10 Units Subcutaneous QHS  . levETIRAcetam  500 mg Oral BID  . pantoprazole  40 mg Oral Daily  . sodium chloride flush  3 mL Intravenous Q12H   Continuous Infusions:     LOS: 4 days    Time spent: 109min    Domenic Polite, MD [facility] Pager [phone]  If 7PM-7AM, please contact night-coverage [email or web address] Password [email or web address] 03/30/2016, 10:11 AM

## 2016-03-30 NOTE — Consult Note (Signed)
Chief Complaint: metastatic ureteral cancer with pulmonary embolism  Referring Physician:Dr. Domenic Polite  Supervising Physician: Arne Cleveland  Patient Status: In-pt   HPI: [name] is an 82 y.o. male with multiple medical problems who was admitted recently due to generalized weakness.  He has ureteral cancer with what appears to be pelvic adenopathy and possible brain mets.  He developed pleuritic chest pain and a CT of the chest was obtained which did reveal multifocal pulmonary emboli ranging from acute to subacute/chronic.  We have been asked to see him for IVC filter placement as his possible brain mets does not allow him to be on anticoagulation.   Past Medical History:  Past Medical History:  Diagnosis Date  . Allergy   . Arthritis    degenerative-knees  . Atrial fibrillation ([place]) 1990  . Bladder cancer ([place]) 2010  . Cancer ([place]) 05/05/07-06/15/07   parotid gland/66.4 GY  . Cancer ([place])    left eyelid   . Chronic kidney disease    positive cytology  . Coronary artery disease   . Diabetes mellitus without complication ([place])    meds d/c  2-3 year ago  . Dry eyes   . Dyslipidemia   . Dysrhythmia    HX A FIB  . GERD (gastroesophageal reflux disease)   . Heart murmur    2/6 systolic  . History of chemotherapy     Hx carboplatin/72f  . History of radiation therapy 05/05/07-06/15/07   right parotid through subclavicular region/ mid jugularlymph node chain  . History of radiation therapy 05/05/07-06/15/07   Right parotid/hemineck,supraclavicular  . HOH (hard of hearing)   . Hypertension    labile  . Pacemaker    2003 vent paced , rate 69  . Pneumonia    hx of   . Prostate hypertrophy   . Right groin hernia   . Skin cancer    HX BASAL CELL REMOVED  . Stroke ([facility] 2003   TIA  . Tinnitus    right   . Urinary tract infection    hx of   . Xerostomia    limited    Past Surgical History:  Past Surgical History:  Procedure Laterality Date  .  BACK SURGERY    . CARDIAC CATHETERIZATION  09/20/2012   EF 50-55%, moderately calcified aortic valve leaflets, mild-moderate aortic valve stenosis, LA-appendage moderately dilated, moderate regurg of the mitral valve, RA moderately dilated  . CARDIOVASCULAR STRESS TEST  09/05/2009   Pharmalogical stress test w/o chest pain or EKG changes for ischemia  . CRANIOTOMY  12/14/2011   Procedure: CRANIOTOMY HEMATOMA EVACUATION SUBDURAL;  Surgeon: JOphelia Charter MD;  [facility];  Service: Neurosurgery;  Laterality: Left;  LEFT Craniotomy for subdural   . CYSTOSCOPY  01/2011   neg  . CYSTOSCOPY W/ RETROGRADES  04/01/2012   Procedure: CYSTOSCOPY WITH RETROGRADE PYELOGRAM;  Surgeon: MClaybon Jabs MD;  [facility]  Service: Urology;  Laterality: Bilateral;  . CYSTOSCOPY WITH BIOPSY  04/01/2012   Procedure: CYSTOSCOPY WITH BIOPSY;  Surgeon: MClaybon Jabs MD;  [facility]  Service: Urology;  Laterality: N/A;  BLADDER BIOPSIES and bladder washings  . CYSTOSCOPY WITH BIOPSY  05/02/2012   Procedure: CYSTOSCOPY WITH BIOPSY;  Surgeon: MClaybon Jabs MD;  [facility]  Service: Urology;  Laterality: N/A;  . CYSTOSCOPY WITH STENT PLACEMENT  05/02/2012   Procedure: CYSTOSCOPY WITH STENT PLACEMENT;  Surgeon: MClaybon Jabs  MD;  Location: [place];  Service: Urology;  Laterality: Bilateral;  . CYSTOSCOPY WITH URETEROSCOPY AND STENT PLACEMENT Right 02/15/2015   Procedure: CYSTOSCOPY WITH RIGHT URETEROSCOPY, RENAL PELVIC WASHINGS, STENT, RETROGRADE;  Surgeon: Kathie Rhodes, MD;  [facility];  Service: Urology;  Laterality: Right;  . CYSTOSCOPY/RETROGRADE/URETEROSCOPY  05/02/2012   Procedure: CYSTOSCOPY/RETROGRADE/URETEROSCOPY;  Surgeon: Claybon Jabs, MD;  [facility];  Service: Urology;  Laterality: Bilateral;  CYSTOSCOPY BILATERAL RETROGRADE PYLOGRAM BILATERAL URETEROSCOPY AND  BIOPSY    . CYSTOSCOPY/RETROGRADE/URETEROSCOPY Bilateral 01/21/2015   Procedure: CYSTOSCOPY/RETROGRADE/ BLADDER BIOPSY;  Surgeon: Kathie Rhodes, MD;  [facility];  Service: Urology;  Laterality: Bilateral;  . LOWER EXTREMITY ARTERIAL DOPPLER  09/28/2011   No evidence of arterial insufficiency.   . [place]  . PACEMAKER GENERATOR CHANGE  05/23/2002   [facility] [place], [place], serial [phone]  . PACEMAKER GENERATOR CHANGE N/A 09/01/2013   Procedure: PACEMAKER GENERATOR CHANGE;  Surgeon: Sanda Klein, MD;  Location: [facility];  Service: Cardiovascular;  Laterality: N/A;  . [place]   last changed 2003  . PAROTIDECTOMY     right  . URETEROSCOPY  04/01/2012   Procedure: URETEROSCOPY;  Surgeon: Claybon Jabs, MD;  [facility];  Service: Urology;  Laterality: Right;    Family History:  Family History  Problem Relation Age of Onset  . Heart disease Mother   . Cancer Father     Lung cancer  . Emphysema Brother   . Coronary artery disease Son     Social History:  reports that he has never smoked. He has never used smokeless tobacco. He reports that he drinks about 10.5 oz of alcohol per week . He reports that he does not use drugs.  Allergies:  Allergies  Allergen Reactions  . Codeine Other (See Comments)    Blisters between fingers  . Docetaxel Rash    Medications: Medications reviewed in Epic  Please HPI for pertinent positives, otherwise complete 10 system ROS negative +chest pain, weakness, HOH.  Mallampati Score: MD Evaluation Airway: WNL Heart: WNL Abdomen: WNL Chest/ Lungs: WNL ASA  Classification: 3 Mallampati/Airway Score: Two  Physical Exam: BP 102/62 (BP Location: Left Arm)   Pulse 65   Temp 98.1 F (36.7 C) (Oral)   Resp 18   Ht 5' 9"  (1.753 m)   Wt 189 lb 6 oz (85.9 kg)   SpO2 97%   BMI 27.97 kg/m  Body mass index is 27.97 kg/m. General: pleasant, WD, WN, elderly white  male who is laying in bed in NAD HEENT: head is normocephalic, atraumatic.  Sclera are noninjected.  PERRL.  Ears and nose without any masses or lesions.  Mouth is pink and moist Heart: regular, rate, and rhythm.  Normal s1,s2. No obvious murmurs, gallops, or rubs noted.  Palpable radial and pedal pulses bilaterally Lungs: CTAB, no wheezes, rhonchi, or rales noted.  Respiratory effort nonlabored Abd: soft, NT, ND, +BS, no masses, hernias, or organomegaly Psych: A&Ox3 with an appropriate affect, but does not seem to understand the depths of our conversation   Labs: Results for orders placed or performed during the hospital encounter of 03/25/16 (from the past 48 hour(s))  Glucose, capillary     Status: Abnormal   Collection Time: 03/28/16  5:11 PM  Result Value Ref Range   Glucose-Capillary 365 (H) 65 - 99 mg/dL  Glucose, capillary     Status: Abnormal   Collection Time:  03/28/16  8:32 PM  Result Value Ref Range   Glucose-Capillary 361 (H) 65 - 99 mg/dL  Basic metabolic panel     Status: Abnormal   Collection Time: 03/29/16  5:11 AM  Result Value Ref Range   Sodium 131 (L) 135 - 145 mmol/L   Potassium 4.9 3.5 - 5.1 mmol/L   Chloride 96 (L) 101 - 111 mmol/L   CO2 29 22 - 32 mmol/L   Glucose, Bld 168 (H) 65 - 99 mg/dL   BUN 37 (H) 6 - 20 mg/dL   Creatinine, Ser 1.20 0.61 - 1.24 mg/dL   Calcium 8.6 (L) 8.9 - 10.3 mg/dL   GFR calc non Af Amer 54 (L) >60 mL/min   GFR calc Af Amer >60 >60 mL/min    Comment: (NOTE) The eGFR has been calculated using the CKD EPI equation. This calculation has not been validated in all clinical situations. eGFR's persistently <60 mL/min signify possible Chronic Kidney Disease.    Anion gap 6 5 - 15  CBC     Status: Abnormal   Collection Time: 03/29/16  5:11 AM  Result Value Ref Range   WBC 10.0 4.0 - 10.5 K/uL   RBC 3.62 (L) 4.22 - 5.81 MIL/uL   Hemoglobin 13.0 13.0 - 17.0 g/dL   HCT 37.3 (L) 39.0 - 52.0 %   MCV 103.0 (H) 78.0 - 100.0 fL   MCH  35.9 (H) 26.0 - 34.0 pg   MCHC 34.9 30.0 - 36.0 g/dL   RDW 12.3 11.5 - 15.5 %   Platelets 50 (L) 150 - 400 K/uL    Comment: CONSISTENT WITH PREVIOUS RESULT  Glucose, capillary     Status: Abnormal   Collection Time: 03/29/16  8:16 AM  Result Value Ref Range   Glucose-Capillary 146 (H) 65 - 99 mg/dL  Glucose, capillary     Status: Abnormal   Collection Time: 03/29/16 12:02 PM  Result Value Ref Range   Glucose-Capillary 273 (H) 65 - 99 mg/dL  Glucose, capillary     Status: Abnormal   Collection Time: 03/29/16  4:51 PM  Result Value Ref Range   Glucose-Capillary 349 (H) 65 - 99 mg/dL  Glucose, capillary     Status: Abnormal   Collection Time: 03/29/16  9:39 PM  Result Value Ref Range   Glucose-Capillary 285 (H) 65 - 99 mg/dL  Basic metabolic panel     Status: Abnormal   Collection Time: 03/30/16  5:30 AM  Result Value Ref Range   Sodium 132 (L) 135 - 145 mmol/L   Potassium 4.5 3.5 - 5.1 mmol/L   Chloride 92 (L) 101 - 111 mmol/L   CO2 30 22 - 32 mmol/L   Glucose, Bld 118 (H) 65 - 99 mg/dL   BUN 31 (H) 6 - 20 mg/dL   Creatinine, Ser 0.97 0.61 - 1.24 mg/dL   Calcium 8.7 (L) 8.9 - 10.3 mg/dL   GFR calc non Af Amer >60 >60 mL/min   GFR calc Af Amer >60 >60 mL/min    Comment: (NOTE) The eGFR has been calculated using the CKD EPI equation. This calculation has not been validated in all clinical situations. eGFR's persistently <60 mL/min signify possible Chronic Kidney Disease.    Anion gap 10 5 - 15  CBC     Status: Abnormal   Collection Time: 03/30/16  5:30 AM  Result Value Ref Range   WBC 14.9 (H) 4.0 - 10.5 K/uL   RBC 4.10 (L) 4.22 - 5.81 MIL/uL  Hemoglobin 14.8 13.0 - 17.0 g/dL   HCT 41.8 39.0 - 52.0 %   MCV 102.0 (H) 78.0 - 100.0 fL   MCH 36.1 (H) 26.0 - 34.0 pg   MCHC 35.4 30.0 - 36.0 g/dL   RDW 12.1 11.5 - 15.5 %   Platelets 73 (L) 150 - 400 K/uL    Comment: CONSISTENT WITH PREVIOUS RESULT  Glucose, capillary     Status: None   Collection Time: 03/30/16  8:02 AM    Result Value Ref Range   Glucose-Capillary 95 65 - 99 mg/dL  Glucose, capillary     Status: None   Collection Time: 03/30/16 12:22 PM  Result Value Ref Range   Glucose-Capillary 93 65 - 99 mg/dL    Imaging: Ct Angio Chest Pe W Or Wo Contrast  Result Date: 03/30/2016 CLINICAL DATA:  Chest pain and shortness of breath. EXAM: CT ANGIOGRAPHY CHEST WITH CONTRAST TECHNIQUE: Multidetector CT imaging of the chest was performed using the standard protocol during bolus administration of intravenous contrast. Multiplanar CT image reconstructions and MIPs were obtained to evaluate the vascular anatomy. CONTRAST:  100 cc Isovue 370 intravenous COMPARISON:  03/01/2016 FINDINGS: Cardiovascular: Study is optimized for the pulmonary arteries and is good quality. There are multiple pulmonary artery filling defects bilaterally, including subsegmental branches in the left lower lobe (6:112 and 134) and in the right middle and lower lobes. Some of these emboli appear acute, especially in the right lower lobe anterior basilar segment 6:149, but elsewhere there are subacute to chronic features, especially proximal segmental branches of the right lower lobe 6:126. Normal RV to LV ratio of 60%. [place] in the right ventricle are prominent, without convincing superimposed thrombus. Cardiomegaly. No pericardial effusion. Single chamber pacer into the right ventricular apex. Atherosclerotic calcification, including the coronary arteries. Mild aortic valve calcification. Mediastinum/Nodes: Negative for adenopathy. Lungs/Pleura: New apical predominant nodular opacities with indistinct appearance. Dominant nodule in the left upper lobe, 5:15, measuring up to 12 mm. Chronic microcalcification or aspirated barium in the dependent right lower lobe Upper Abdomen: Cholelithiasis.  No acute finding. Musculoskeletal: T5 vertebral body fracture that is oblique. The fracture is subacute based on previous study, new from 11/05/2015. Fracture  is nondisplaced and does not appear to continue through the posterior elements. There is diffuse ankylosis of the thoracic spine. Critical Value/emergent results were called by telephone at the time of interpretation on 03/30/2016 at 12:31 pm to Dr. Domenic Polite , who verbally acknowledged these results. Review of the MIP images confirms the above findings. IMPRESSION: 1. Multifocal pulmonary emboli ranging from acute to subacute/chronic, segmental to subsegmental size. Dilated pulmonary arterial tree suggest pulmonary hypertension. No indication of acute right heart strain. 2. Nondisplaced subacute T5 body fracture without visible continuation through the posterior elements. 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 3. New multi focal nodular lung opacities. Ill-defined appearance and development over 1 month suggests atypical, potentially hematogenous infection. Metastases are not excluded and close follow-up is recommended in this patient with urothelial cancer. Electronically Signed   By: Monte Fantasia M.D.   On: 03/30/2016 12:32    Assessment/Plan 1. Multifocal PEs with advanced bladder cancer -I have seen the patient and discussed this case with Dr. Vernard Gambles.  He is on plavix and we are agreeable to place an IVC filter while on plavix.  Unfortunately, the patient does not seem to understand the need and the procedure of placing an IVC filter.  I have contacted his daughter who is  also sick and was in the ED today due to diverticulitis.  She has a meeting tomorrow with palliative care at 1030.  Neither the daughter nor the patient made the statement of proceeding with this procedure.  I have informed them that I will prepare him for IVC filter placement, but we will check back with them tomorrow to get their final ideas on whether they want [place] to proceed or not. -NPO p MN in case we can proceed tomorrow. -Plavix can be continued per Dr.  Vernard Gambles -will follow  Thank you for this interesting consult.  I greatly enjoyed meeting MARSHAUN LORTIE and look forward to participating in their care.  A copy of this report was sent to the requesting provider on this date.  Electronically Signed: Henreitta Cea 03/30/2016, 3:37 PM   I spent a total of 40 Minutes    in face to face in clinical consultation, greater than 50% of which was counseling/coordinating care for PE, need for IVC filter

## 2016-03-30 NOTE — Progress Notes (Signed)
Events in the last few days noted. Patient developed pleuritic chest pain and currently undergoing CT scan to rule out pulmonary embolism. He is physically not in his room for my complete evaluation. His case was discussed with Dr. Broadus John today. He developed thrombocytopenia related to his antiseizure medication and his platelet count appears to be improving after discontinuation of Depakote.  I agree with palliative care consult given his overall poor prognosis associated with advanced bladder cancer. Despite no evidence of measurable disease he presented with local adenopathy and likely his disease will recur and eventually die from this malignancy in months rather than years.  Definitive treatment for his questionable brain metastasis is under consideration. Dr. Tammi Klippel will discuss this with the patient and his daughter.  If his CT scan showed pulmonary embolism, his brain lesions could present contraindication to anticoagulation and IVC filter placement might be his best option.  I will continue to check on him periodically and answer any questions you have regarding his care.

## 2016-03-31 ENCOUNTER — Inpatient Hospital Stay (HOSPITAL_COMMUNITY): Payer: Medicare Other

## 2016-03-31 ENCOUNTER — Encounter (HOSPITAL_COMMUNITY): Payer: Self-pay | Admitting: Radiology

## 2016-03-31 DIAGNOSIS — Z515 Encounter for palliative care: Secondary | ICD-10-CM

## 2016-03-31 DIAGNOSIS — R071 Chest pain on breathing: Secondary | ICD-10-CM

## 2016-03-31 DIAGNOSIS — Z7189 Other specified counseling: Secondary | ICD-10-CM

## 2016-03-31 HISTORY — PX: IR GENERIC HISTORICAL: IMG1180011

## 2016-03-31 LAB — GLUCOSE, CAPILLARY
GLUCOSE-CAPILLARY: 62 mg/dL — AB (ref 65–99)
GLUCOSE-CAPILLARY: 87 mg/dL (ref 65–99)
GLUCOSE-CAPILLARY: 83 mg/dL (ref 65–99)
Glucose-Capillary: 125 mg/dL — ABNORMAL HIGH (ref 65–99)
Glucose-Capillary: 125 mg/dL — ABNORMAL HIGH (ref 65–99)
Glucose-Capillary: 33 mg/dL — CL (ref 65–99)
Glucose-Capillary: 62 mg/dL — ABNORMAL LOW (ref 65–99)
Glucose-Capillary: 64 mg/dL — ABNORMAL LOW (ref 65–99)

## 2016-03-31 LAB — PROTIME-INR
INR: 1.09
PROTHROMBIN TIME: 14.2 s (ref 11.4–15.2)

## 2016-03-31 LAB — SURGICAL PCR SCREEN
MRSA, PCR: POSITIVE — AB
STAPHYLOCOCCUS AUREUS: POSITIVE — AB

## 2016-03-31 MED ORDER — MIDAZOLAM HCL 2 MG/2ML IJ SOLN
INTRAMUSCULAR | Status: AC
  Filled 2016-03-31: qty 2

## 2016-03-31 MED ORDER — SENNOSIDES-DOCUSATE SODIUM 8.6-50 MG PO TABS
2.0000 | ORAL_TABLET | Freq: Every day | ORAL | Status: DC
  Administered 2016-03-31 – 2016-04-04 (×5): 2 via ORAL
  Filled 2016-03-31 (×5): qty 2

## 2016-03-31 MED ORDER — FENTANYL CITRATE (PF) 100 MCG/2ML IJ SOLN
INTRAMUSCULAR | Status: AC | PRN
  Administered 2016-03-31: 25 ug via INTRAVENOUS

## 2016-03-31 MED ORDER — INSULIN GLARGINE 100 UNIT/ML ~~LOC~~ SOLN
5.0000 [IU] | Freq: Every day | SUBCUTANEOUS | Status: DC
  Administered 2016-03-31 – 2016-04-08 (×9): 5 [IU] via SUBCUTANEOUS
  Filled 2016-03-31 (×10): qty 0.05

## 2016-03-31 MED ORDER — WHITE PETROLATUM GEL
Status: AC
  Filled 2016-03-31: qty 1

## 2016-03-31 MED ORDER — DEXTROSE 50 % IV SOLN
INTRAVENOUS | Status: AC
  Filled 2016-03-31: qty 50

## 2016-03-31 MED ORDER — LIDOCAINE HCL 1 % IJ SOLN
INTRAMUSCULAR | Status: AC | PRN
  Administered 2016-03-31: 15 mL

## 2016-03-31 MED ORDER — OXYCODONE HCL 5 MG PO TABS: 5.0000 mg | ORAL_TABLET | ORAL | Status: DC | PRN

## 2016-03-31 MED ORDER — IOPAMIDOL (ISOVUE-300) INJECTION 61%
INTRAVENOUS | Status: AC
  Administered 2016-03-31: 75 mL
  Filled 2016-03-31: qty 75

## 2016-03-31 MED ORDER — LIDOCAINE HCL 1 % IJ SOLN
INTRAMUSCULAR | Status: AC
  Filled 2016-03-31: qty 20

## 2016-03-31 MED ORDER — DEXTROSE 50 % IV SOLN
INTRAVENOUS | Status: AC
  Administered 2016-03-31: 25 mL via INTRAVENOUS
  Filled 2016-03-31: qty 50

## 2016-03-31 MED ORDER — MIDAZOLAM HCL 2 MG/2ML IJ SOLN
INTRAMUSCULAR | Status: AC | PRN
  Administered 2016-03-31: 0.5 mg via INTRAVENOUS

## 2016-03-31 MED ORDER — IOPAMIDOL (ISOVUE-300) INJECTION 61%
INTRAVENOUS | Status: AC
  Administered 2016-03-31: 50 mL
  Filled 2016-03-31: qty 100

## 2016-03-31 MED ORDER — FENTANYL CITRATE (PF) 100 MCG/2ML IJ SOLN
INTRAMUSCULAR | Status: AC
  Filled 2016-03-31: qty 2

## 2016-03-31 NOTE — Progress Notes (Signed)
PROGRESS NOTE    LANDYNN ENGERT  [id number] DOB: 02-09-1930 DOA: 03/25/2016 PCP: [name], MD Brief Narrative:Dann RAYCEN LIBONATI is an 82 y.o. male with H/o atrial fibrillation not on anticoagulation, pacemaker- for sinus node dysfunction, mild to mod AS, subdural hematoma, diet controlled diabetes, transitional cell carcinoma with intermittent hematuria, s/p chemotherapy, TIA, HTN was discharged from [facility] 9/13 to [facility] for rehab after long hospitalization for new onset seizures and possible brain metastasis noted on CT head, he was seen by Neurology, NEurosurgery and Oncology, couldn't have an MRI due to pacer. Finally plan was for FU with Dr.Shadad in Nov with plan for repeat CT with SRS protocol. Patient was discharged from [facility] on 9/13.  Then readmitted with weakness and failure to thrive, noted to have AKI, creatinine of 1.9 from baseline of 1, NA was 126, K was 6.2 CT head with Probable new focus of enhancement within the right paramedian frontal lobe,previously identified by right frontal parafalcine hyperdense focus is no longer appreciated,, Left occipital hyperdense focus with uncertain enhancement. Neurology, Rad Onc and Oncology following, unable to have MRI. Discussed again on tumor board 10/9 Palliative consulted 10/9: pleuritic chest pain-CTA chest with acute on chronic PE-not anticoagulation candidate 10/10: plan for Palliative care meeting and IVC filter  Assessment & Plan:  Brain lesions/Seizures: Noted  last admission. Suspected to have possible metastatic R frontal and 20mm L occipital lobe brain lesion. Pt followed by Neurosurgery, NEurology, and Oncology.  -Per review of DC note from last admission :""Abnormal Brain CT. The patient's case was presented at multidisciplinary brain oncology conference  including neuroradiology, three neurosurgeons, and two radiation oncologists. In reviewing the imaging (limited by the fact we cannot recommend MRI),  the patient's anomalies enchance on non-contrasted studies, though not on the contrasted images. At the conclusion of the discussion, recommendations were made to repeat a CT with and without contrast with SRS protocol in 2 months. Brain navigator will coordinate this with the patient and family, and Dr. Alen Blew will continue to follow him for his history of bladder cancer." -Pt was started on Decadron and Depakote. - continue Decadron, changed depakote to keppra due to worsening thrombocytopenia, d/w Dr.Kirkpatrick today who felt that this was an appropriate substitution - Neuro following, Dr.Shadad and Rad Onc Dr.Manning consulted, unable to definitively prove mets vs hemorrhages at this time  - discussed with Neurology Dr.Turk again, he called and reviewed images with 2 neuroradiologists and they feel these are likely malignant lesions. Dr.Turk d/w Dr.Manning 10/6 -Dr.Manning recommended eval for possible MRI-since pacer felt to be MRI conditional -d/w Cards Dr.Croitoru, who reviewed pacer leads and not safe for MRIs -patients case was discussed further at tumor board today, Palliative consult requested and Dr.Manning will FU with daughter to decide regarding SRS -Palliative meeting this am  ACute on chronic PE 10/9  -c/o Pleuritic chest pain 10/9 am -dopplers positive for acute on chronic DVT -has only been on SCDs for DVT prophylaxis due to brain lesions and thrombocytopenia -will not be a safe anticoagulation candidate due to brain lesions, and low platelets, requested IVC filter, to be done today  Generalized weakness: Likely multifactorial including dehydration, metabolic derangement, polypharmacy Depakote, progressive metastatic disease. - Pt/OT eval completed, SNF recommended - Depakote level is 46, since he is elderly, we did not change dose - hydrated now off IVF - improving, plan has been for SNF  AKI with hyperkalemia and hyponatremia -improved, stopped IVF  Diabetes: likely  steroid induced.  -  H/o Pre-DM as last A1c 5.5 - Hyperglycemia in 300s yesterday, started lantus and meal coverage now with hypoglycemia, cut down lantus and stopped meal coverage,  SSI -CBGs more  stable now  Recent hydronephrosis due to possible transitional cell carcinoma of R ureter: noted during previous admission - Renal US with mild hydronephrosis only -creatinine improved  Thrombocytopenia -heparin on hold immediately after admission, got 2 doses on day of admission for DVT prophylaxis then stopped due to drop in platelet counts -baseline platelets from last admission in 100-110K range -HIT panel negative -could be worsened due to depakote, hence changed this to [place] 10/8 -labs pending today  HTN: - continue norvasc  H/o Stroke/CAD: - continue plavix,   GERD: - continue ppi   ETOH abuse: - No further ETOH since previous admission  ANxiety: - continue xanax prn  DVT prophylaxis:SCDs due to low platelets Code Status:Full Code Family Communication:No family at bedside, d/w daughter 10/5, 10/7 and 10/9 Disposition Plan:needs SNF-when stable     Subjective: Feels ok, breathing better today  Objective: Vitals:   03/30/16 0550 03/30/16 1528 03/30/16 2245 03/31/16 0536  BP: 117/69 102/62 99/63 (!) 96/58  Pulse: (!) 57 65 63 60  Resp: 18 18 18 18   Temp: 97.4 F (36.3 C) 98.1 F (36.7 C) 98.2 F (36.8 C) 98.2 F (36.8 C)  TempSrc:  Oral Oral Oral  SpO2: 92% 97% 95% 97%  Weight: 85.9 kg (189 lb 6 oz)     Height:        Intake/Output Summary (Last 24 hours) at 03/31/16 1124 Last data filed at 03/30/16 1638  Gross per 24 hour  Intake               60 ml  Output                0 ml  Net               60 ml   Filed Weights   03/25/16 1022 03/25/16 2036 03/30/16 0550  Weight: 86.6 kg (191 lb) 79.3 kg (174 lb 14.4 oz) 85.9 kg (189 lb 6 oz)    Examination:  General exam: Appears calm and comfortable  Respiratory system: Clear to auscultation.  Respiratory effort normal. Cardiovascular system: S1 & S2 heard, RRR. No JVD, murmurs, rubs, gallops or clicks. Gastrointestinal system: Abdomen is nondistended, soft and nontender. No organomegaly or masses felt. Normal bowel sounds heard. Central nervous system: Alert and oriented. No focal neurological deficits. Extremities: 1 plus edema Skin: No rashes, lesions or ulcers Psychiatry: Judgement and insight appear normal. Mood & affect appropriate.     Data Reviewed: I have personally reviewed following labs and imaging studies  CBC:  Recent Labs Lab 03/25/16 1027 03/26/16 0634 03/27/16 0631 03/29/16 0511 03/30/16 0530  WBC 12.5* 10.8* 9.6 10.0 14.9*  NEUTROABS 11.7*  --   --   --   --   HGB 15.5 14.6 13.8 13.0 14.8  HCT 42.5 41.6 39.1 37.3* 41.8  MCV 101.2* 102.5* 102.4* 103.0* 102.0*  PLT 85* 67* 53* 50* 73*   Basic Metabolic Panel:  Recent Labs Lab 03/25/16 1903 03/26/16 0634 03/27/16 0631 03/29/16 0511 03/30/16 0530  NA 130* 132* 134* 131* 132*  K 4.9 5.2* 4.7 4.9 4.5  CL 94* 92* 97* 96* 92*  CO2 26 28 29 29 30   GLUCOSE 248* 230* 48* 168* 118*  BUN 59* 42* 30* 37* 31*  CREATININE 1.39* 1.13 0.90 1.20 0.97  CALCIUM 9.0  8.7* 8.6* 8.6* 8.7*   GFR: Estimated Creatinine Clearance: 61.6 mL/min (by C-G formula based on SCr of 0.97 mg/dL). Liver Function Tests: No results for input(s): AST, ALT, ALKPHOS, BILITOT, PROT, ALBUMIN in the last 168 hours. No results for input(s): LIPASE, AMYLASE in the last 168 hours. No results for input(s): AMMONIA in the last 168 hours. Coagulation Profile:  Recent Labs Lab 03/31/16 0558  INR 1.09   Cardiac Enzymes: No results for input(s): CKTOTAL, CKMB, CKMBINDEX, TROPONINI in the last 168 hours. BNP (last 3 results) No results for input(s): PROBNP in the last 8760 hours. HbA1C: No results for input(s): HGBA1C in the last 72 hours. CBG:  Recent Labs Lab 03/30/16 1222 03/30/16 1645 03/30/16 1744 03/30/16 2241  03/31/16 0751  GLUCAP 93 41* 127* 134* 125*   Lipid Profile: No results for input(s): CHOL, HDL, LDLCALC, TRIG, CHOLHDL, LDLDIRECT in the last 72 hours. Thyroid Function Tests: No results for input(s): TSH, T4TOTAL, FREET4, T3FREE, THYROIDAB in the last 72 hours. Anemia Panel: No results for input(s): VITAMINB12, FOLATE, FERRITIN, TIBC, IRON, RETICCTPCT in the last 72 hours. Urine analysis:    Component Value Date/Time   COLORURINE YELLOW 03/25/2016 1200   APPEARANCEUR CLEAR 03/25/2016 1200   LABSPEC 1.028 03/25/2016 1200   PHURINE 5.5 03/25/2016 1200   GLUCOSEU >1000 (A) 03/25/2016 1200   HGBUR SMALL (A) 03/25/2016 1200   BILIRUBINUR NEGATIVE 03/25/2016 1200   KETONESUR NEGATIVE 03/25/2016 1200   PROTEINUR NEGATIVE 03/25/2016 1200   UROBILINOGEN 1.0 04/03/2012 1018   NITRITE NEGATIVE 03/25/2016 1200   LEUKOCYTESUR NEGATIVE 03/25/2016 1200   Sepsis Labs: @LABRCNTIP (procalcitonin:4,lacticidven:4)  ) Recent Results (from the past 240 hour(s))  Surgical pcr screen     Status: Abnormal   Collection Time: 03/31/16  4:56 AM  Result Value Ref Range Status   MRSA, PCR POSITIVE (A) NEGATIVE Final    Comment: RESULT CALLED TO, READ BACK BY AND VERIFIED WITH: C DANIELS,RN @0652  03/31/16 MKELLY,MLT    Staphylococcus aureus POSITIVE (A) NEGATIVE Final    Comment:        The Xpert SA Assay (FDA approved for NASAL specimens in patients over 28 years of age), is one component of a comprehensive surveillance program.  Test performance has been validated by [facility] for patients greater than or equal to 89 year old. It is not intended to diagnose infection nor to guide or monitor treatment.          Radiology Studies: Ct Head W & Wo Contrast  Result Date: 03/31/2016 CLINICAL DATA:  Followup brain lesion. Acute presentation with weakness. EXAM: CT HEAD WITHOUT AND WITH CONTRAST TECHNIQUE: Contiguous axial images were obtained from the base of the skull through the vertex  without and with intravenous contrast CONTRAST:  35mL ISOVUE-300 IOPAMIDOL (ISOVUE-300) INJECTION 61%<Contrast>69mL ISOVUE-300 IOPAMIDOL (ISOVUE-300) INJECTION 61% COMPARISON:  03/25/2016 an multiple previous FINDINGS: Brain: 2 brain lesions are identified. No abnormality seen affecting the brainstem or cerebellum. Within the left posterior temporal/occipital region, there is an enhancing mass measuring 12 mm cephalo caudal with a transverse diameter of 8 mm consistent with a metastasis. Within the right medial frontal lobe, there is a 2.1 cm by 2.2 x 1.8 mass with areas of cystic degeneration. These are consistent with metastatic lesions. No other lesion is identified by CT. Punctate focus of cortical calcification in the right parietal region is unchanged. Old left frontal cortical and subcortical infarction is unchanged. Chronic small-vessel ischemic changes affect the white matter. No hydrocephalus. No extra-axial collection. Vascular:  No primary vascular lesion. Major vessels show flow. There is atherosclerotic calcification of the major vessels at the base of the brain. Skull: Previous left frontoparietal craniotomy. Sinuses/Orbits: Clear Other: None significant IMPRESSION: Two brain masses identified consist with metastatic disease. Partially necrotic right frontal mass measuring 2.1 x 2.2 x 1.8 cm. Left posterior temporal/ occipital mass measuring 12 x 8 x 8 mm. Electronically Signed   By: Nelson Chimes M.D.   On: 03/31/2016 10:13   Ct Angio Chest Pe W Or Wo Contrast  Result Date: 03/30/2016 CLINICAL DATA:  Chest pain and shortness of breath. EXAM: CT ANGIOGRAPHY CHEST WITH CONTRAST TECHNIQUE: Multidetector CT imaging of the chest was performed using the standard protocol during bolus administration of intravenous contrast. Multiplanar CT image reconstructions and MIPs were obtained to evaluate the vascular anatomy. CONTRAST:  100 cc Isovue 370 intravenous COMPARISON:  03/01/2016 FINDINGS: Cardiovascular:  Study is optimized for the pulmonary arteries and is good quality. There are multiple pulmonary artery filling defects bilaterally, including subsegmental branches in the left lower lobe (6:112 and 134) and in the right middle and lower lobes. Some of these emboli appear acute, especially in the right lower lobe anterior basilar segment 6:149, but elsewhere there are subacute to chronic features, especially proximal segmental branches of the right lower lobe 6:126. Normal RV to LV ratio of 60%. [place] in the right ventricle are prominent, without convincing superimposed thrombus. Cardiomegaly. No pericardial effusion. Single chamber pacer into the right ventricular apex. Atherosclerotic calcification, including the coronary arteries. Mild aortic valve calcification. Mediastinum/Nodes: Negative for adenopathy. Lungs/Pleura: New apical predominant nodular opacities with indistinct appearance. Dominant nodule in the left upper lobe, 5:15, measuring up to 12 mm. Chronic microcalcification or aspirated barium in the dependent right lower lobe Upper Abdomen: Cholelithiasis.  No acute finding. Musculoskeletal: T5 vertebral body fracture that is oblique. The fracture is subacute based on previous study, new from 11/05/2015. Fracture is nondisplaced and does not appear to continue through the posterior elements. There is diffuse ankylosis of the thoracic spine. Critical Value/emergent results were called by telephone at the time of interpretation on 03/30/2016 at 12:31 pm to Dr. Domenic Polite , who verbally acknowledged these results. Review of the MIP images confirms the above findings. IMPRESSION: 1. Multifocal pulmonary emboli ranging from acute to subacute/chronic, segmental to subsegmental size. Dilated pulmonary arterial tree suggest pulmonary hypertension. No indication of acute right heart strain. 2. Nondisplaced subacute T5 body fracture without visible continuation through the posterior elements.  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 3. New multi focal nodular lung opacities. Ill-defined appearance and development over 1 month suggests atypical, potentially hematogenous infection. Metastases are not excluded and close follow-up is recommended in this patient with urothelial cancer. Electronically Signed   By: Monte Fantasia M.D.   On: 03/30/2016 12:32        Scheduled Meds: . amLODipine  10 mg Oral Daily  . dexamethasone  4 mg Oral Daily  . feeding supplement (GLUCERNA SHAKE)  237 mL Oral BID BM  . insulin aspart  0-15 Units Subcutaneous TID WC  . insulin aspart  0-5 Units Subcutaneous QHS  . insulin glargine  10 Units Subcutaneous QHS  . levETIRAcetam  500 mg Oral BID  . pantoprazole  40 mg Oral Daily  . senna-docusate  1 tablet Oral QHS  . sodium chloride flush  3 mL Intravenous Q12H   Continuous Infusions:     LOS: 5 days    Time spent: 44min  Domenic Polite, MD [facility] Pager [phone]  If 7PM-7AM, please contact night-coverage [email or web address] Password [email or web address] 03/31/2016, 11:24 AM

## 2016-03-31 NOTE — Sedation Documentation (Signed)
Pt has no IV access- pts arms are in very rough shape- so MD will gain access and give pt meds

## 2016-03-31 NOTE — Progress Notes (Addendum)
Hypoglycemic Event  CBG: 62  Treatment: 15 GM carbohydrate snack  Symptoms: None  Follow-up CBG: Time: 1750 CBG Result:64 giving another snack   Follow up 1848 was 87   Possible Reasons for Event: Inadequate meal intake  Comments/MD notified:Will text page    [name] Margaretha Sheffield

## 2016-03-31 NOTE — Progress Notes (Signed)
Daily Progress Note   Patient Name: [name]       Date: 03/31/2016 DOB: 24-Jul-1929  Age: 82 y.o. MRN#: [id number] Attending Physician: Gabriel Polite, MD Primary Care Physician: Gabriel Pao, MD Admit Date: 03/25/2016  Reason for Consultation/Follow-up: Establishing goals of care  Subjective: Dr. Albin [name] and I met with the family at bedside including 2 daughters and 2 grandsons.  The patient was present but did not really participate in the conversation due to his hearing loss.  [name] did comment "I feel like I'm dying, because nothing is working right".  He complained of pain in his upper chest that was worse with breathing.  His daughter mentions that he never complains - so if he is mentioning pain - then he is really hurting.   She is also concerned about whether or not he is having BMs.   Finally she prefers that he not have morphine as it makes him "mean".  After introducing Palliative his daughter [name] stated that her father wants to be resuscitated. She expressed frustration because no one can tell us what is going on in his brain.  No one can say whether or not this is cancer.  She is meeting this afternoon with [name] and [employer].    We reviewed the CT head together and discussed the two different sized lesions.  We then reviewed the CT from September and noted the differences.  We discussed the PEs, the thrombocytopenia and the plan for IVC filter placement.  [name] is decisional - he's just very hard of hearing.  Once all of the available information is gathered the plan is to re-meet with the family and have a more detailed [name] meeting including the completion of a Living Will.  [name] was living at home with [facility], but if he is unable to walk  he can not return home.    Length of Stay: 5  Current Medications: Scheduled Meds:  . amLODipine  10 mg Oral Daily  . dexamethasone  4 mg Oral Daily  . feeding supplement (GLUCERNA SHAKE)  237 mL Oral BID BM  . insulin aspart  0-15 Units Subcutaneous TID WC  . insulin aspart  0-5 Units Subcutaneous QHS  . insulin glargine  5 Units Subcutaneous QHS  . levETIRAcetam  500 mg  Oral BID  . pantoprazole  40 mg Oral Daily  . senna-docusate  1 tablet Oral QHS  . sodium chloride flush  3 mL Intravenous Q12H    Continuous Infusions:    PRN Meds: ALPRAZolam, morphine injection, ondansetron **OR** ondansetron (ZOFRAN) IV, oxyCODONE, polyethylene glycol, prochlorperazine  Physical Exam         Well developed elderly male, very hard of hearing CV slightly bradycardic no obvious murmurs Resp pain with inspiration but no increased work of breathing, CTA Abdomen soft, NT Ext.  No edema noted, blackening of skin in lower legs.  Vital Signs: BP (!) 96/58 (BP Location: Right Arm)   Pulse 60   Temp 98.2 F (36.8 C) (Oral)   Resp 18   Ht _0  (1.753 m)   Wt 85.9 kg (189 lb 6 oz)   SpO2 97%   BMI 27.97 kg/m  SpO2: SpO2: 97 % O2 Device: O2 Device: Not Delivered O2 Flow Rate:    Intake/output summary:  Intake/Output Summary (Last 24 hours) at 03/31/16 1253 Last data filed at 03/30/16 1638  Gross per 24 hour  Intake               60 ml  Output                0 ml  Net               60 ml   LBM: Last BM Date: 03/28/16 Baseline Weight: Weight: 86.6 kg (191 lb) Most recent weight: Weight: 85.9 kg (189 lb 6 oz)       Palliative Assessment/Data:    Flowsheet Rows   Flowsheet Row Most Recent Value  Intake Tab  Referral Department  Hospitalist  Unit at Time of Referral  Other (Comment)  Palliative Care Primary Diagnosis  Cancer  Date Notified  03/30/16  Palliative Care Type  New Palliative care  Reason for referral  Clarify Goals of Care, Counsel Regarding Hospice  Date of  Admission  03/25/16  Date first seen by Palliative Care  03/30/16  # of days Palliative referral response time  0 Day(s)  # of days IP prior to Palliative referral  5  Clinical Assessment  Palliative Performance Scale Score  30%  Psychosocial & Spiritual Assessment  Palliative Care Outcomes  Patient/Family meeting held?  Yes  Who was at the meeting?  patient.        Patient Active Problem List   Diagnosis Date Noted  . Hyperglycemia 03/25/2016  . Left frontal lobe lesion 03/25/2016  . AKI (acute kidney injury) ([place]) 03/25/2016  . Hyperkalemia 03/25/2016  . Hydronephrosis 03/25/2016  . Dehydration   . Hydronephrosis due to obstruction of ureter   . Elevated troponin   . Seizure ([place])   . Abnormal brain CT 03/03/2016  . Hypokalemia   . Seizures ([place]) 02/29/2016  . Syncope 02/28/2016  . Port catheter in place 02/21/2016  . Neutropenic fever ([place])   . Malnutrition of moderate degree 08/28/2015  . Diet-controlled diabetes mellitus ([place])   . Coronary artery disease 08/27/2015  . Thrombocytopenia ([place]) 08/27/2015  . Hematuria 08/27/2015  . Pancytopenia ([place]) 08/27/2015  . Sepsis ([place])   . 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 Chest pain 07/17/2014  . Aortic stenosis 04/29/2013  . Thrombocytosis ([place]) 03/30/2013  . Abnormal urine cytology 05/02/2012  . Acute pulmonary edema ([place]) 10/04/2011  . Alcohol withdrawal delirium ([place]) 10/04/2011  . TIA (transient ischemic attack) 10/03/2011  . Subdural hematoma (  [place]) 10/02/2011  . ETOH abuse 10/02/2011  . Permanent atrial fibrillation ([place]) 10/02/2011  . UTI (lower urinary tract infection) 10/02/2011  . Elevated PSA 05/19/2011  . Pacemaker 05/19/2011  . Diabetes mellitus ([place]) 05/19/2011  . Hypertension 05/19/2011  . Dyslipidemia 05/19/2011    Palliative Care Assessment & Plan   Patient Profile:  82 y.o. 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 He was recently hospitalized from 9/8-9/13 for new onset-seizures and found to have a left occipital lobe mass and a right frontal lobe mass on CT head. He improved after antiepileptics and steroids and was discharged on 9/13 to [facility] for [facility]hab. He was readmitted on 10/4 after he "lost all strength" while ambulating to the bathroom and was unable to get up. During this admission he has undergone repeat CT head which showed a new right frontal lobe focus, resolution of the previous right frontal lobe mass, and a stable left occipital lobe mass. He was started on Decadron and Depakote was changed to Keppra due to new thrombocytopenia. Given the resolution of one of the focuses on CT there was question if these lesions did represent metastases vs hemorrhages. Multiple specialities, including Oncology, Radiation Oncology, Neurology, and Neurosurgery have weighed in and metastases are suspected after reviewing images. Dr. Tammi Klippel with Rad Onc may pursue SRS depending on [place] discussion. Patient also with new onset pleuritic chest pain today and found to have multiple pulmonary emboli on CTA chest for which he is receiving an IVC filter. He was also noted to have new focal nodular lung opacities on CTA.  Assessment: Patient with likely brain metastases from uncertain primary.   Found to have multiple bilateral PEs with active chest pain.  Unable to be anticoagulated due to thrombocytopenia and h/o SDH.   Functional status is declining quickly.  Nutritional status is fair.  Will to live is undetermined (patient appears depressed).  Recommendations/Plan:  Patient and family need information from Oncology and Rad Onc before they are able to decide on goals of care.  Will follow up with patient and family tomorrow for  more GOC discussion.  Goals of Care and Additional Recommendations:  Limitations on Scope of Treatment: Full Comfort Care  Code Status:  Full code - further discussion to take place after patient has heard from Oncology  Prognosis:   < 6 months - given multiple brain tumors, quick decline.  Discharge Planning:  [facility] for rehab with Palliative care service follow-up  Care plan was discussed with Dr. Albin [name]  Thank you for allowing the Palliative Medicine Team to assist in the care of this patient.   Time In: 10:30 Time Out: 11:30 Total Time 60 min Prolonged Time Billed yes      Greater than 50%  of this time was spent counseling and coordinating care related to the above assessment and plan.  Imogene Burn, PA-C Palliative Medicine   Please contact Palliative MedicineTeam phone at [phone] for questions and concerns.   Please see AMION for individual provider pager numbers.

## 2016-03-31 NOTE — Progress Notes (Signed)
OT Cancellation Note  Patient Details Name: [name] MRN: [id number] DOB: [birth year]   Cancelled Treatment:    Reason Eval/Treat Not Completed: Other (comment). Per PT note earlier today MD wants therapy held for today.   Almon Register [id number] 03/31/2016, 12:28 PM

## 2016-03-31 NOTE — Sedation Documentation (Signed)
Patient is resting comfortably. 

## 2016-03-31 NOTE — Progress Notes (Signed)
PT Cancellation Note  Patient Details Name: [name] MRN: [id number] DOB: 06/22/1930   Cancelled Treatment:    Reason Eval/Treat Not Completed: Medical issues which prohibited therapy;Other (comment) (hold until tomorrow-per MD)PT will continue to follow acutely.    Salina April, PTA Pager: [phone]   03/31/2016, 12:01 PM

## 2016-04-01 ENCOUNTER — Inpatient Hospital Stay (HOSPITAL_COMMUNITY): Payer: Medicare Other

## 2016-04-01 ENCOUNTER — Other Ambulatory Visit: Payer: Self-pay | Admitting: Radiation Oncology

## 2016-04-01 DIAGNOSIS — Z7189 Other specified counseling: Secondary | ICD-10-CM

## 2016-04-01 DIAGNOSIS — R4182 Altered mental status, unspecified: Secondary | ICD-10-CM | POA: Insufficient documentation

## 2016-04-01 LAB — CBC WITH DIFFERENTIAL/PLATELET
BASOS PCT: 0 %
Basophils Absolute: 0 10*3/uL (ref 0.0–0.1)
EOS PCT: 0 %
Eosinophils Absolute: 0 10*3/uL (ref 0.0–0.7)
HEMATOCRIT: 38.3 % — AB (ref 39.0–52.0)
Hemoglobin: 13.6 g/dL (ref 13.0–17.0)
LYMPHS ABS: 0.2 10*3/uL — AB (ref 0.7–4.0)
Lymphocytes Relative: 2 %
MCH: 36.2 pg — ABNORMAL HIGH (ref 26.0–34.0)
MCHC: 35.5 g/dL (ref 30.0–36.0)
MCV: 101.9 fL — AB (ref 78.0–100.0)
MONO ABS: 0.3 10*3/uL (ref 0.1–1.0)
Monocytes Relative: 3 %
NEUTROS PCT: 95 %
Neutro Abs: 8.1 10*3/uL — ABNORMAL HIGH (ref 1.7–7.7)
Platelets: 49 10*3/uL — ABNORMAL LOW (ref 150–400)
RBC: 3.76 MIL/uL — ABNORMAL LOW (ref 4.22–5.81)
RDW: 12.6 % (ref 11.5–15.5)
WBC Morphology: INCREASED
WBC: 8.6 10*3/uL (ref 4.0–10.5)

## 2016-04-01 LAB — LACTIC ACID, PLASMA: LACTIC ACID, VENOUS: 3.2 mmol/L — AB (ref 0.5–1.9)

## 2016-04-01 LAB — COMPREHENSIVE METABOLIC PANEL
ALK PHOS: 96 U/L (ref 38–126)
ALT: 18 U/L (ref 17–63)
AST: 22 U/L (ref 15–41)
Albumin: 2.2 g/dL — ABNORMAL LOW (ref 3.5–5.0)
Anion gap: 11 (ref 5–15)
BILIRUBIN TOTAL: 0.9 mg/dL (ref 0.3–1.2)
BUN: 29 mg/dL — AB (ref 6–20)
CALCIUM: 8.7 mg/dL — AB (ref 8.9–10.3)
CO2: 27 mmol/L (ref 22–32)
CREATININE: 1.26 mg/dL — AB (ref 0.61–1.24)
Chloride: 89 mmol/L — ABNORMAL LOW (ref 101–111)
GFR calc Af Amer: 59 mL/min — ABNORMAL LOW (ref >60)
GFR, EST NON AFRICAN AMERICAN: 51 mL/min — AB (ref >60)
Glucose, Bld: 212 mg/dL — ABNORMAL HIGH (ref 65–99)
POTASSIUM: 5.5 mmol/L — AB (ref 3.5–5.1)
Sodium: 127 mmol/L — ABNORMAL LOW (ref 135–145)
TOTAL PROTEIN: 5.2 g/dL — AB (ref 6.5–8.1)

## 2016-04-01 LAB — BASIC METABOLIC PANEL
ANION GAP: 9 (ref 5–15)
BUN: 25 mg/dL — ABNORMAL HIGH (ref 6–20)
CALCIUM: 8.7 mg/dL — AB (ref 8.9–10.3)
CHLORIDE: 89 mmol/L — AB (ref 101–111)
CO2: 32 mmol/L (ref 22–32)
Creatinine, Ser: 1.04 mg/dL (ref 0.61–1.24)
GFR calc non Af Amer: >60 mL/min (ref >60)
GLUCOSE: 143 mg/dL — AB (ref 65–99)
Potassium: 5.2 mmol/L — ABNORMAL HIGH (ref 3.5–5.1)
Sodium: 130 mmol/L — ABNORMAL LOW (ref 135–145)

## 2016-04-01 LAB — VITAMIN B12: VITAMIN B 12: 572 pg/mL (ref 180–914)

## 2016-04-01 LAB — CBC
HEMATOCRIT: 40.2 % (ref 39.0–52.0)
HEMOGLOBIN: 14.3 g/dL (ref 13.0–17.0)
MCH: 36.3 pg — AB (ref 26.0–34.0)
MCHC: 35.6 g/dL (ref 30.0–36.0)
MCV: 102 fL — AB (ref 78.0–100.0)
Platelets: 53 10*3/uL — ABNORMAL LOW (ref 150–400)
RBC: 3.94 MIL/uL — AB (ref 4.22–5.81)
RDW: 12.5 % (ref 11.5–15.5)
WBC: 10 10*3/uL (ref 4.0–10.5)

## 2016-04-01 LAB — AMMONIA: Ammonia: 29 umol/L (ref 9–35)

## 2016-04-01 LAB — FOLATE: Folate: 5.9 ng/mL — ABNORMAL LOW (ref >5.9)

## 2016-04-01 LAB — GLUCOSE, CAPILLARY
GLUCOSE-CAPILLARY: 193 mg/dL — AB (ref 65–99)
GLUCOSE-CAPILLARY: 212 mg/dL — AB (ref 65–99)
GLUCOSE-CAPILLARY: 198 mg/dL — AB (ref 65–99)
GLUCOSE-CAPILLARY: 179 mg/dL — AB (ref 65–99)
Glucose-Capillary: 199 mg/dL — ABNORMAL HIGH (ref 65–99)

## 2016-04-01 LAB — SODIUM
SODIUM: 128 mmol/L — AB (ref 135–145)
SODIUM: 127 mmol/L — AB (ref 135–145)

## 2016-04-01 MED ORDER — LEVETIRACETAM 500 MG/5ML IV SOLN
1000.0000 mg | Freq: Once | INTRAVENOUS | Status: AC
  Administered 2016-04-01: 1000 mg via INTRAVENOUS
  Filled 2016-04-01: qty 10

## 2016-04-01 MED ORDER — MORPHINE SULFATE (PF) 2 MG/ML IV SOLN
2.0000 mg | INTRAVENOUS | Status: DC | PRN
  Administered 2016-04-04 – 2016-04-09 (×14): 2 mg via INTRAVENOUS
  Filled 2016-04-01 (×14): qty 1

## 2016-04-01 MED ORDER — LEVETIRACETAM 750 MG PO TABS: 750.0000 mg | ORAL_TABLET | Freq: Two times a day (BID) | ORAL | Status: DC

## 2016-04-01 MED ORDER — SODIUM CHLORIDE 0.9% FLUSH: 10.0000 mL | INTRAVENOUS | Status: DC | PRN

## 2016-04-01 MED ORDER — SODIUM POLYSTYRENE SULFONATE 15 GM/60ML PO SUSP
30.0000 g | Freq: Once | ORAL | Status: AC
  Administered 2016-04-01: 30 g via ORAL
  Filled 2016-04-01: qty 120

## 2016-04-01 MED ORDER — SODIUM CHLORIDE 0.9 % IV SOLN
2.0000 g | INTRAVENOUS | Status: DC
  Administered 2016-04-01 – 2016-04-02 (×3): 2 g via INTRAVENOUS
  Filled 2016-04-01 (×8): qty 2000

## 2016-04-01 MED ORDER — POLYETHYLENE GLYCOL 3350 17 G PO PACK
17.0000 g | PACK | Freq: Two times a day (BID) | ORAL | Status: DC
  Administered 2016-04-01 – 2016-04-05 (×7): 17 g via ORAL
  Filled 2016-04-01 (×7): qty 1

## 2016-04-01 MED ORDER — ACETAMINOPHEN 500 MG PO TABS
1000.0000 mg | ORAL_TABLET | Freq: Three times a day (TID) | ORAL | Status: DC
  Administered 2016-04-01 – 2016-04-08 (×19): 1000 mg via ORAL
  Filled 2016-04-01 (×22): qty 2

## 2016-04-01 MED ORDER — DEXTROSE 5 % IV SOLN
2.0000 g | Freq: Two times a day (BID) | INTRAVENOUS | Status: DC
  Administered 2016-04-01 – 2016-04-02 (×2): 2 g via INTRAVENOUS
  Filled 2016-04-01 (×3): qty 2

## 2016-04-01 MED ORDER — VANCOMYCIN HCL IN DEXTROSE 1-5 GM/200ML-% IV SOLN: 1000.0000 mg | Freq: Once | INTRAVENOUS | Status: DC

## 2016-04-01 MED ORDER — ORAL CARE MOUTH RINSE
15.0000 mL | Freq: Two times a day (BID) | OROMUCOSAL | Status: DC
  Administered 2016-04-01 – 2016-04-10 (×13): 15 mL via OROMUCOSAL

## 2016-04-01 MED ORDER — TRAMADOL HCL 50 MG PO TABS: 100.0000 mg | ORAL_TABLET | Freq: Four times a day (QID) | ORAL | Status: DC | PRN

## 2016-04-01 MED ORDER — ACETAMINOPHEN 325 MG PO TABS
650.0000 mg | ORAL_TABLET | ORAL | Status: DC | PRN
  Administered 2016-04-01: 650 mg via ORAL
  Filled 2016-04-01: qty 2

## 2016-04-01 MED ORDER — BISACODYL 10 MG RE SUPP
10.0000 mg | Freq: Once | RECTAL | Status: AC
  Administered 2016-04-01: 10 mg via RECTAL
  Filled 2016-04-01: qty 1

## 2016-04-01 MED ORDER — VANCOMYCIN HCL IN DEXTROSE 750-5 MG/150ML-% IV SOLN
750.0000 mg | Freq: Two times a day (BID) | INTRAVENOUS | Status: DC
  Administered 2016-04-01 – 2016-04-04 (×6): 750 mg via INTRAVENOUS
  Filled 2016-04-01 (×8): qty 150

## 2016-04-01 MED ORDER — SODIUM CHLORIDE 0.9 % IV SOLN
INTRAVENOUS | Status: DC
  Administered 2016-04-01 – 2016-04-03 (×3): via INTRAVENOUS

## 2016-04-01 NOTE — Progress Notes (Addendum)
Subjective: Patient had an acute mental status change earlier today  Exam: Vitals:   04/01/16 1430 04/01/16 1702  BP: 114/64   Pulse: 75   Resp: 16   Temp: 98.8 F (37.1 C) 99.6 F (37.6 C)   Gen: In bed, NAD Resp: non-labored breathing, no acute distress Abd: soft, nt Neck: Extremely stiff with pain with any movement  Neuro: MS: Awake, able follow commands, limited speech output CN: Pupils equal round and reactive to light, extra ocular movements intact Motor: He moves all extremities to command, does not comply with formal strength testing Sensory: He does respond to noxious stimuli in all 4 extremities  Pertinent Labs: Mild hypokalemia, hyponatremia  Impression: 82 year old male with intracranial metastasis with worsening mental status. He will need a stat CT to make sure that he hasn't had intracranial bleeding at his metastasis. He does have a stiff neck and has had borderline fevers. My suspicion of meningitis is low, but in the setting I think it needs to be considered.  He has severe thrombocytopenia, and at this time I think that this is a contraindication to lumbar puncture, and also intracranial masses would raise the risk as well. This time I think that empiric antibiotics would be less risky than LP.  Also possible would be that he is currently postictal, and I will give additional Keppra and increase his dose in case this.  Finally multifactorial delirium is the most likely explanation.  Recommendations: 1) CT head 2) EEG 3) Keppra 1 g 1, increase dose to 750 twice a day 4) we will continue to follow  [name], MD [facility] [phone]  If 7pm- 7am, please page neurology on call as listed in [place].

## 2016-04-01 NOTE — Progress Notes (Signed)
Occupational Therapy Treatment Patient Details Name: [name] MRN: [id number] DOB: 27-Nov-1929 Today's Date: 04/01/2016    History of present illness Patient is an 82 yo male admitted from home (was in a SNF just prior to this) with generalized weakness and having to lowered to floor by his daughter. PMHx: bladder and sking CA, brain metastais, seizures, delirium, Afib, SDH with craniotomy, pacemaker, DM, transitional cell CA, TIA, HTN, ETOH use, CAD   OT comments  This 82 yo male admitted with above presents to acute OT with not showing progress since eval of 03/26/2016. He willing participates, but movements appear to be harder and more taxing for him today. Will continue to follow.  Follow Up Recommendations  SNF    Equipment Recommendations  None recommended by OT       Precautions / Restrictions Precautions Precautions: Fall Precaution Comments: seizures, brain mets Restrictions Weight Bearing Restrictions: No Other Position/Activity Restrictions: has significant L knee OA pain       Mobility Bed Mobility Overal bed mobility: Needs Assistance;+2 for physical assistance Bed Mobility: Supine to Sit     Supine to sit: Mod assist;+2 for physical assistance;HOB elevated        Transfers Overall transfer level: Needs assistance Equipment used: Rolling walker (2 wheeled) Transfers: Sit to/from Stand Sit to Stand: Mod assist;+2 physical assistance (from raised bed)         General transfer comment: Pt ambulated 4 feet foreward from side of bed and then had to bring chair behind him to sit    Balance Overall balance assessment: Needs assistance Sitting-balance support: Feet supported;No upper extremity supported Sitting balance-Leahy Scale: Fair     Standing balance support: Bilateral upper extremity supported Standing balance-Leahy Scale: Poor Standing balance comment: heavy reliance on RW today                   ADL Overall ADL's : Needs  assistance/impaired     Grooming: Wash/dry face;Supervision/safety;Set up;Sitting (EOB)                   Toilet Transfer: Moderate assistance;+2 for physical assistance;Ambulation;RW Toilet Transfer Details (indicate cue type and reason): bed>4 steps forward>sit in recliner behind him Toileting- Water quality scientist and Hygiene: Total assistance (Mod A +2 sit<>stand)                          Cognition   Behavior During Therapy: WFL for tasks assessed/performed Overall Cognitive Status: History of cognitive impairments - at baseline ("poor historian" per chart)                                    Pertinent Vitals/ Pain       Pain Assessment: Faces Faces Pain Scale: Hurts whole lot Pain Location: Left side of rib cage under arm area Pain Descriptors / Indicators: Grimacing;Moaning Pain Intervention(s): Monitored during session;Repositioned         Frequency  Min 2X/week        Progress Toward Goals  OT Goals(current goals can now be found in the care plan section)  Progress towards OT goals: Not progressing toward goals - comment (pt appears weaker than he was on 03/26/2016 based off his A levels today )     Plan Discharge plan remains appropriate    Co-evaluation    PT/OT/SLP Co-Evaluation/Treatment: Yes Reason for Co-Treatment: For patient/therapist safety  OT goals addressed during session: ADL's and self-care;Strengthening/ROM      End of Session Equipment Utilized During Treatment: Gait belt;Rolling walker   Activity Tolerance Patient limited by fatigue   Patient Left in chair;with call bell/phone within reach;with chair alarm set   Nurse Communication Mobility status (on board)        [id number] OT Time Calculation (min): 36 min  Charges: OT General Charges $OT Visit: 1 Procedure OT Treatments $Self Care/Home Management : 8-22 mins  Almon Register [id number] 04/01/2016, 10:58 AM

## 2016-04-01 NOTE — Progress Notes (Signed)
Pharmacy Antibiotic Note  [name] is a 82 y.o. male admitted on 03/25/2016 with brain lesions  Pharmacy has now been consulted for vancomycin for possible meningitis.  Admitted with brain lesions/metastases and seizures with plans for possible radiotherapy Also with chronic PE, IVC filter placed on 10/10 as he is not a candidate for anticoagulation   Plan: Vancomycin 750 mg iv Q 12 Rocephin 2 grams iv Q 12 Ampicillin 2 grams iv Q 4 hours   Height: 5\' 9"  (175.3 cm) Weight: 189 lb 6 oz (85.9 kg) IBW/kg (Calculated) : 70.7  Temp (24hrs), Avg:99.8 F (37.7 C), Min:98.8 F (37.1 C), Max:100.7 F (38.2 C)   Recent Labs Lab 03/26/16 0634 03/27/16 0631 03/29/16 0511 03/30/16 0530 03/31/16 2343 04/01/16 0045  WBC 10.8* 9.6 10.0 14.9*  --  10.0  CREATININE 1.13 0.90 1.20 0.97 1.04  --     Estimated Creatinine Clearance: 57.4 mL/min (by C-G formula based on SCr of 1.04 mg/dL).    Allergies  Allergen Reactions  . Codeine Other (See Comments)    Blisters between fingers  . Docetaxel Rash    Thank you for allowing pharmacy to be a part of this patient's care. Anette Guarneri, PharmD [phone] 04/01/2016 5:20 PM

## 2016-04-01 NOTE — Progress Notes (Signed)
Dr. Posey Pronto was notified of decreased mental status. Pt will follow commands but is nonverbal and drowsy. According to his daughter, this is a big change from 10/10.

## 2016-04-01 NOTE — Progress Notes (Signed)
Dr. Hilbert Bible was paged to report critical lab value - lactic acid.  No return call yet. Sonia, RN was notified to continue calling if no response.

## 2016-04-01 NOTE — Care Management Important Message (Signed)
Important Message  Patient Details  Name: [name] MRN: [id number] Date of Birth: 13-Jan-1930   Medicare Important Message Given:  Yes    Carles Collet, RN 04/01/2016, 10:29 AM

## 2016-04-01 NOTE — Progress Notes (Signed)
Nutrition Follow-up  DOCUMENTATION CODES:   Not applicable  INTERVENTION:   -Continue Glucerna Shake po BID, each supplement provides 220 kcal and 10 grams of protein  NUTRITION DIAGNOSIS:   Predicted suboptimal nutrient intake related to acute illness as evidenced by percent weight loss.  Ongoing  GOAL:   Patient will meet greater than or equal to 90% of their needs  Progressing  MONITOR:   PO intake, Supplement acceptance, Labs, Weight trends, Skin, I & O's  REASON FOR ASSESSMENT:   Malnutrition Screening Tool    ASSESSMENT:   Patient was discharged from [facility] directly to [facility] for rehab. Patient was discharged from [facility] 9 days ago. At that time he was feeling near his baseline. However, he quickly deteriorated developing generalized weakness which is been constant and progressive.   Pt sleeping soundly in recliner chair at time of visit. RD did not disturb. No family at bedside.   Pt continues to consume Glucerna supplements per MAR. Meal completion 75-100%.   Oncology and palliative care following. Pt with metastatic brain lesions; discussions with family regarding goals of care ongoing.   Labs reviewed: Na: 130, K: 5.2, CBGS: 83-199.  Diet Order:  Diet Carb Modified Fluid consistency: Thin; Room service appropriate? Yes  Skin:  Reviewed, no issues  Last BM:  03/28/16  Height:   Ht Readings from Last 1 Encounters:  03/25/16 5\' 9"  (1.753 m)    Weight:   Wt Readings from Last 1 Encounters:  03/30/16 189 lb 6 oz (85.9 kg)    Ideal Body Weight:  72.7 kg  BMI:  Body mass index is 27.97 kg/m.  Estimated Nutritional Needs:   Kcal:  1700-1900  Protein:  85-100 grams  Fluid:  1.7-1.9 L  EDUCATION NEEDS:   No education needs identified at this time  [name] A. Jimmye Norman, RD, LDN, CDE Pager: [phone] After hours Pager: [phone]

## 2016-04-01 NOTE — Progress Notes (Signed)
[facility] Progress Note  Patient: [name] [id number]   PCP: [name], MD DOB: 11/18/1929   DOA: 03/25/2016   DOS: 04/01/2016   Date of Service: the patient was seen and examined on 04/01/2016  Brief hospital course: [name] an 82 y.o.malewith H/o atrial fibrillation not on anticoagulation, pacemaker- for sinus node dysfunction, mild to mod AS, subdural hematoma, diet controlled diabetes, transitional cell carcinoma with intermittent hematuria, s/p chemotherapy, TIA, HTN was discharged from [facility] 9/13 to [facility] for rehab after long hospitalization for new onset seizures and possible brain metastasis noted on CT head, he was seen by Neurology, NEurosurgery and Oncology, couldn't have an MRI due to pacer. Finally plan was for FU with Dr.Shadad in Nov with plan for repeat CT with SRS protocol. Patient was discharged from [facility] on 9/13.  Then readmitted with weakness and failure to thrive, noted to have AKI, creatinine of 1.9 from baseline of 1, NA was 126, K was 6.2 CT head with Probable new focus of enhancement within the right paramedian frontal lobe,previously identified by right frontal parafalcine hyperdense focus is no longer appreciated,, Left occipital hyperdense focus with uncertain enhancement. Neurology, Rad Onc and Oncology following, unable to have MRI. Discussed again on tumor board 10/9 Palliative consulted 10/9: pleuritic chest pain-CTA chest with acute on chronic PE-not anticoagulation candidate 10/10: plan for Palliative care meeting and IVC filter 10/11 developing acute encephalopathy Currently further plan is further workup of acute encephalopathy.  Assessment and Plan: 1. Metastatic Brain lesions/Seizures:  Noted last admission.  Suspected to have possible metastatic R frontal and 74mm L occipital lobe brain lesion. Pt followed by Neurosurgery, neurology, and Oncology.  Pt was started on Decadron and Depakote. continue  Decadron, changed depakote to keppra due to worsening thrombocytopenia,  Neuro following, Dr.Shadad and Rad Onc Dr.Manning consulted discussed with Neurology Dr.Turk again, he called and reviewed images with 2 neuroradiologists and they feel these are likely malignant lesions. Dr.Turk d/w Dr.Manning 10/6 Dr.Manning recommended eval for possible MRI-since pacer felt to be MRI conditional d/w Cards Dr.Croitoru, who reviewed pacer leads and not safe for MRIs Palliative consult requested and Dr.Manning will FU with daughter to decide regarding SRS  2. Acute on chronic PE 10/9  -c/o Pleuritic chest pain 10/9 am -dopplers positive for acute on chronic DVT -has only been on SCDs for DVT prophylaxis due to brain lesions and thrombocytopenia -will not be a safe anticoagulation candidate due to brain lesions, and low platelets, requested IVC filter, to be done today  3.acute encephalopathy. Possibly intracranial bleed, versus postictal state. Possibility of meningitis with neck stiffness cannot be ruled out. Starting the patient on IV antibiotics getting a stat MRI. Appreciate nephrology input.  4. Generalized weakness: Likely multifactorial including dehydration, metabolic derangement, polypharmacy Depakote, progressive metastatic disease. - Pt/OT eval completed, SNF recommended - hydrated now off IVF - improving, plan has been for SNF  5. AKI with hyperkalemia and hyponatremia -improved, stopped IVF  6. Diabetes: likely steroid induced.  - H/o Pre-DM as last A1c 5.5 - CBGs more  stable now  7. 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 Thrombocytopenia -heparin on hold immediately after admission, got 2 doses on day of admission for DVT prophylaxis then stopped due to drop in platelet counts -baseline platelets from last admission in 100-110K  range -HIT panel negative -could be worsened due to  depakote, hence changed this to [place] 10/8 Also possibility of consumptive process due to PE cannot be ruled out.  9. HTN: - continue norvasc  10. H/o Stroke/CAD: Unable to continue antiplatelet medications due to thrombocytopenia as well as metastatic brain lesions.  11. GERD: - continue ppi  12. ETOH abuse: - No further ETOH since previous admission  13. Anxiety: - continue xanax prn  Pain management: Avoiding narcotics in the setting of acute encephalopathy Activity: SNF physical therapy Bowel regimen: last BM : 03/31/2016 Diet: Cardiac diet DVT Prophylaxis mechanical compression device.  Advance goals of care discussion: Palliative care discussion ongoing. Currently low threshold to transfer to step down unit.  Family Communication: family was present at bedside, at the time of interview. The pt provided permission to discuss medical plan with the family. Opportunity was given to ask question and all questions were answered satisfactorily.   Disposition:  Discharge to SNF.  Consultants: Neurology, medical oncology, radiation oncology, neurosurgery Procedures: None  Antibiotics: Anti-infectives    Start     Dose/Rate Route Frequency Ordered Stop   04/01/16 1800  cefTRIAXone (ROCEPHIN) 2 g in dextrose 5 % 50 mL IVPB     2 g 100 mL/hr over 30 Minutes Intravenous Every 12 hours 04/01/16 1717     04/01/16 1800  ampicillin (OMNIPEN) 2 g in sodium chloride 0.9 % 50 mL IVPB     2 g 150 mL/hr over 20 Minutes Intravenous Every 4 hours 04/01/16 1717     04/01/16 1800  vancomycin (VANCOCIN) IVPB 750 mg/150 ml premix     750 mg 150 mL/hr over 60 Minutes Intravenous Every 12 hours 04/01/16 1731     04/01/16 1730  vancomycin (VANCOCIN) IVPB 1000 mg/200 mL premix  Status:  Discontinued     1,000 mg 200 mL/hr over 60 Minutes Intravenous  Once 04/01/16 1717 04/01/16 1722      Subjective: During the morning evaluation  the patient was awake and oriented and did not have any acute complaints other than chest pain. Later in the afternoon the patient was confused and lethargic unable to follow command  Objective: Physical Exam: Vitals:   04/01/16 0625 04/01/16 0843 04/01/16 1430 04/01/16 1702  BP: (!) 100/49 110/60 114/64   Pulse: 61  (!) 140   Resp: 17  16   Temp: 99.5 F (37.5 C)  98.8 F (37.1 C) 99.6 F (37.6 C)  TempSrc: Oral  Oral Oral  SpO2: 94%     Weight:      Height:        Intake/Output Summary (Last 24 hours) at 04/01/16 1826 Last data filed at 04/01/16 1800  Gross per 24 hour  Intake                3 ml  Output              835 ml  Net             -832 ml   Filed Weights   03/25/16 1022 03/25/16 2036 03/30/16 0550  Weight: 86.6 kg (191 lb) 79.3 kg (174 lb 14.4 oz) 85.9 kg (189 lb 6 oz)    General: Alert, Awake and Oriented to Person. Appear in moderate distress, affect appropriate Eyes: PERRL, Conjunctiva normal ENT: Oral Mucosa clear moist. Neck: difficult to assess JVD, no Abnormal Mass Or lumps Cardiovascular: S1 and S2 Present, no Murmur, Respiratory: Bilateral Air entry equal and Decreased, no use of accessory muscle, Clear to Auscultation, no Crackles, no  wheezes Abdomen: Bowel Sound present, Soft and no tenderness Skin: no redness, no Rash, no induration Extremities: no Pedal edema, no calf tenderness Neurologic: Mental status AAOx1, speech normal, attention normal,  Cranial Nerves PERRL, EOM normal and present, Motor strength Left 4 x 5  Sensation present to light touch,   Data Reviewed: CBC:  Recent Labs Lab 03/26/16 0634 03/27/16 0631 03/29/16 0511 03/30/16 0530 04/01/16 0045  WBC 10.8* 9.6 10.0 14.9* 10.0  HGB 14.6 13.8 13.0 14.8 14.3  HCT 41.6 39.1 37.3* 41.8 40.2  MCV 102.5* 102.4* 103.0* 102.0* 102.0*  PLT 67* 53* 50* 73* 53*   Basic Metabolic Panel:  Recent Labs Lab 03/26/16 0634 03/27/16 0631 03/29/16 0511 03/30/16 0530 03/31/16 2343   NA 132* 134* 131* 132* 130*  K 5.2* 4.7 4.9 4.5 5.2*  CL 92* 97* 96* 92* 89*  CO2 28 29 29 30  32  GLUCOSE 230* 48* 168* 118* 143*  BUN 42* 30* 37* 31* 25*  CREATININE 1.13 0.90 1.20 0.97 1.04  CALCIUM 8.7* 8.6* 8.6* 8.7* 8.7*    Liver Function Tests: No results for input(s): AST, ALT, ALKPHOS, BILITOT, PROT, ALBUMIN in the last 168 hours. No results for input(s): LIPASE, AMYLASE in the last 168 hours. No results for input(s): AMMONIA in the last 168 hours. Coagulation Profile:  Recent Labs Lab 03/31/16 0558  INR 1.09   Cardiac Enzymes: No results for input(s): CKTOTAL, CKMB, CKMBINDEX, TROPONINI in the last 168 hours. BNP (last 3 results) No results for input(s): PROBNP in the last 8760 hours.  CBG:  Recent Labs Lab 03/31/16 2239 04/01/16 0811 04/01/16 1202 04/01/16 1448 04/01/16 1726  GLUCAP 83 199* 193* 212* 198*    Studies: No results found.   Scheduled Meds: . acetaminophen  1,000 mg Oral Q8H  . amLODipine  10 mg Oral Daily  . ampicillin (OMNIPEN) IV  2 g Intravenous Q4H  . bisacodyl  10 mg Rectal Once  . cefTRIAXone (ROCEPHIN)  IV  2 g Intravenous Q12H  . dexamethasone  4 mg Oral Daily  . feeding supplement (GLUCERNA SHAKE)  237 mL Oral BID BM  . insulin aspart  0-15 Units Subcutaneous TID WC  . insulin aspart  0-5 Units Subcutaneous QHS  . insulin glargine  5 Units Subcutaneous QHS  . levETIRAcetam  1,000 mg Intravenous Once  . [START ON 04/02/2016] levETIRAcetam  750 mg Oral BID  . mouth rinse  15 mL Mouth Rinse BID  . pantoprazole  40 mg Oral Daily  . polyethylene glycol  17 g Oral BID  . senna-docusate  2 tablet Oral QHS  . sodium chloride flush  3 mL Intravenous Q12H  . vancomycin  750 mg Intravenous Q12H   Continuous Infusions:  PRN Meds: acetaminophen, ALPRAZolam, morphine injection, ondansetron **OR** ondansetron (ZOFRAN) IV, prochlorperazine, sodium chloride flush  Time spent: 35 minutes  Author: Berle Mull, MD [facility] Pager: [phone] 04/01/2016 6:26 PM  If 7PM-7AM, please contact night-coverage at [email or web address], password [facility]

## 2016-04-01 NOTE — Progress Notes (Signed)
Physical Therapy Treatment Patient Details Name: XABIAN ZAITZ MRN: [id number] DOB: [birth year] Today's Date: 04/01/2016    History of Present Illness Patient is an 82 yo male admitted 02/28/16 after being found on floor by daughter.  Patient with similar syncopal episode several days prior.  Patient with new seizures, metastatic brain lesions, and delerium.    PMH:  Afib, SDH with craniotomy, pacemaker, DM, transitional cell CA, TIA, HTN, ETOH use, CAD    PT Comments    Patient tolerated OOB to recliner and ~59ft of ambulation with mod A +2. Current plan remains appropriate.   Follow Up Recommendations  SNF;Supervision/Assistance - 24 hour     Equipment Recommendations  None recommended by PT    Recommendations for Other Services       Precautions / Restrictions Precautions Precautions: Fall Precaution Comments: syncope and seizures Restrictions Weight Bearing Restrictions: No Other Position/Activity Restrictions: has significant L knee OA pain    Mobility  Bed Mobility Overal bed mobility: Needs Assistance;+ 2 for safety/equipment Bed Mobility: Supine to Sit     Supine to sit: Mod assist;+2 for physical assistance;HOB elevated     General bed mobility comments: verbal and tactile cues for sequencing and technique; assist to roll with hand over hand to reach for rail and assist to bring bilat LE to EOB and elevate trunk into sitting  Transfers Overall transfer level: Needs assistance Equipment used: Rolling walker (2 wheeled) Transfers: Sit to/from Stand Sit to Stand: Mod assist;+2 physical assistance;From elevated surface Stand pivot transfers: Mod assist;+2 physical assistance       General transfer comment: assist to power up into standing, maintain balance upon stand, and to weight shift for improved footing and BOS  Ambulation/Gait Ambulation/Gait assistance: Mod assist;+2 physical assistance Ambulation Distance (Feet): 4 Feet Assistive device: Rolling  walker (2 wheeled) Gait Pattern/deviations: Step-through pattern;Decreased stride length;Trunk flexed;Wide base of support Gait velocity: decreased   General Gait Details: multimodal cues for posture and advancement of bilat LE (R > L); assist to manage RW and for balance   Stairs            Wheelchair Mobility    Modified Rankin (Stroke Patients Only)       Balance Overall balance assessment: Needs assistance Sitting-balance support: Feet supported;No upper extremity supported Sitting balance-Leahy Scale: Fair     Standing balance support: Bilateral upper extremity supported Standing balance-Leahy Scale: Poor Standing balance comment: heavy reliance on RW today                    Cognition Arousal/Alertness: Awake/alert Behavior During Therapy: WFL for tasks assessed/performed Overall Cognitive Status: History of cognitive impairments - at baseline                      Exercises General Exercises - Lower Extremity Long Arc Quad: AROM;Both;10 reps;Seated Hip Flexion/Marching: AROM;Both;10 reps;Seated Toe Raises: AROM;Both;10 reps;Seated    General Comments General comments (skin integrity, edema, etc.): pt very HOH; seems to hear a little better L side      Pertinent Vitals/Pain Pain Assessment: Faces Faces Pain Scale: Hurts whole lot Pain Location: L side rib cage and under L arm Pain Descriptors / Indicators: Grimacing;Moaning Pain Intervention(s): Monitored during session;Repositioned    Home Living                      Prior Function            PT Goals (current  goals can now be found in the care plan section) Acute Rehab PT Goals Patient Stated Goal: Unable to state Progress towards PT goals: Progressing toward goals    Frequency    Min 3X/week      PT Plan Current plan remains appropriate    Co-evaluation   Reason for Co-Treatment: For patient/therapist safety   OT goals addressed during session: ADL's and  self-care;Strengthening/ROM     End of Session Equipment Utilized During Treatment: Gait belt Activity Tolerance: Patient limited by pain Patient left: with call bell/phone within reach;in chair;with chair alarm set     Time: [id number] PT Time Calculation (min) (ACUTE ONLY): 31 min  Charges:  $Gait Training: 8-22 mins $Therapeutic Activity: 8-22 mins                    G Codes:      Salina April, PTA Pager: [phone]   04/01/2016, 11:35 AM

## 2016-04-01 NOTE — Progress Notes (Signed)
Daily Progress Note   Patient Name: [name]       Date: 04/01/2016 DOB: [birth year]  Age: 82 y.o. MRN#: [id number] Attending Physician: Lavina Hamman, MD Primary Care Physician: [name], MD Admit Date: 03/25/2016  Reason for Consultation/Follow-up: Establishing goals of care  Subjective: Patient sitting in chair - appears very uncomfortable, but denies pain and has no requests.  I asked him about the radiation to the brain for his brain masses.  He replied ok.  I asked if he died would he want [place] to attempt resuscitation, and he replied "no I've had enough".  I then reasked the question in front of his daughter [name] and he stated he would want to have resuscitation attempted.  He denies pain, but he is grimacing.  When asked directly - are you having chest pain ?  He replies yes.  Daughter does not want him in pain, but also does not want him on morphine.  We agree to scheduled tylenol and morphine only for severe pain.  Dtr is very concerned about his bowel movements.   We ordered a ducolax suppository.  He is on senna/s 2 tabs at night and miralax bid as well.  Patient's hands are lying on his stomach  - both stomach and hands are trembling.  Left hand appears cyanotic.  Tech reports CBGs have been repeatedly 20 points lower in left hand than in right hand.  Length of Stay: 6  Current Medications: Scheduled Meds:  . amLODipine  10 mg Oral Daily  . dexamethasone  4 mg Oral Daily  . feeding supplement (GLUCERNA SHAKE)  237 mL Oral BID BM  . insulin aspart  0-15 Units Subcutaneous TID WC  . insulin aspart  0-5 Units Subcutaneous QHS  . insulin glargine  5 Units Subcutaneous QHS  . levETIRAcetam  500 mg Oral BID  . pantoprazole  40 mg Oral Daily  . polyethylene glycol   17 g Oral BID  . senna-docusate  2 tablet Oral QHS  . sodium chloride flush  3 mL Intravenous Q12H    Continuous Infusions:    PRN Meds: acetaminophen, ALPRAZolam, morphine injection, ondansetron **OR** ondansetron (ZOFRAN) IV, oxyCODONE, prochlorperazine, sodium chloride flush  Physical Exam       Well developed elderly male, sitting in the recliner chair.  Very hard of hearing.  Grimacing as if in pain CV rrr difficult to hear.  Port in right chest Resp CTA, No increased work of breathing Ext.  Able to move all 4.  No edema, left hand appears bluish - worse in finger tips. Skin dry, multiple bruises.       Vital Signs: BP 114/64 (BP Location: Left Arm)   Pulse (!) 140   Temp 98.8 F (37.1 C) (Oral)   Resp 16   Ht 5\' 9"  (1.753 m)   Wt 85.9 kg (189 lb 6 oz)   SpO2 94%   BMI 27.97 kg/m  SpO2: SpO2: 94 % O2 Device: O2 Device: Not Delivered O2 Flow Rate:    Intake/output summary:  Intake/Output Summary (Last 24 hours) at 04/01/16 1433 Last data filed at 04/01/16 0849  Gross per 24 hour  Intake                3 ml  Output              700 ml  Net             -697 ml   LBM: Last BM Date: 03/28/16 Baseline Weight: Weight: 86.6 kg (191 lb) Most recent weight: Weight: 85.9 kg (189 lb 6 oz)       Palliative Assessment/Data:    Flowsheet Rows   Flowsheet Row Most Recent Value  Intake Tab  Referral Department  Hospitalist  Unit at Time of Referral  Other (Comment)  Palliative Care Primary Diagnosis  Cancer  Date Notified  03/30/16  Palliative Care Type  New Palliative care  Reason for referral  Clarify Goals of Care, Counsel Regarding Hospice  Date of Admission  03/25/16  Date first seen by Palliative Care  03/30/16  # of days Palliative referral response time  0 Day(s)  # of days IP prior to Palliative referral  5  Clinical Assessment  Palliative Performance Scale Score  30%  Psychosocial & Spiritual Assessment  Palliative Care Outcomes  Patient/Family meeting  held?  Yes  Who was at the meeting?  [name] and 1 other dtr, 2 grandsons  Palliative Care Outcomes  Improved pain interventions, Other (Comment)      Patient Active Problem List   Diagnosis Date Noted  . Palliative care encounter   . DNR (do not resuscitate) discussion   . Hyperglycemia 03/25/2016  . Brain lesion 03/25/2016  . AKI (acute kidney injury) ([place]) 03/25/2016  . Hyperkalemia 03/25/2016  . Hydronephrosis 03/25/2016  . Dehydration   . Hydronephrosis due to obstruction of ureter   . Elevated troponin   . Seizure ([place])   . Abnormal brain CT 03/03/2016  . Hypokalemia   . Seizures ([place]) 02/29/2016  . Syncope 02/28/2016  . Port catheter in place 02/21/2016  . Neutropenic fever ([place])   . Malnutrition of moderate degree 08/28/2015  . Diet-controlled diabetes mellitus ([place])   . Coronary artery disease 08/27/2015  . Thrombocytopenia ([place]) 08/27/2015  . Hematuria 08/27/2015  . Pancytopenia ([place]) 08/27/2015  . Sepsis ([place])   . 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 Chest pain 07/17/2014  . Aortic stenosis 04/29/2013  . Thrombocytosis ([place]) 03/30/2013  . Abnormal urine cytology 05/02/2012  . Acute pulmonary edema ([place]) 10/04/2011  . Alcohol withdrawal delirium ([place]) 10/04/2011  . TIA (transient ischemic attack) 10/03/2011  . Subdural hematoma ([place]) 10/02/2011  . ETOH abuse 10/02/2011  . Permanent atrial fibrillation ([place]) 10/02/2011  . UTI (lower urinary tract infection) 10/02/2011  .  Elevated PSA 05/19/2011  . Pacemaker 05/19/2011  . Diabetes mellitus ([place]) 05/19/2011  . Hypertension 05/19/2011  . Dyslipidemia 05/19/2011    Palliative Care Assessment & Plan   Patient Profile: 82 y.o. 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 He was recently hospitalized from 9/8-9/13 for new onset-seizures and found to have a left occipital lobe mass and a right frontal lobe mass on CT head. He is unable to have MRI due to his pacemaker leads.  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  He developed chest pain and was found to have bilateral multiple PEs.  He is not a candidate for anticoagulation due to SDH and thrombocytopenia.  An IVC filter was placed on 10/10.   He is not a candidate for anticoagulation.  The next step is a one time SRS procedure thru radiation oncology that will palliate the brain mets, the primary cancer will still likely take his life at some point.  Assessment: Currently patient is hemodynamically stable but appears uncomfortable.  CBGs have been low.  He will continue to have difficulty with clotting.  Recommendations/Plan:  Dr. Posey Pronto called to re-evaluate patient as he seems in pain and his hand is blue  Increased bowel regimen  Scheduled tylenol, and prn Morphine for only severe pain.  Patient remains full code.  Will proceed with SRS via Dr. Tammi Klippel for brain mets.  Goals of Care and Additional Recommendations:  Limitations on Scope of Treatment: Full Scope Treatment  Code Status:  Full code  Prognosis:   Unable to determine  Discharge Planning:  [facility] for rehab with Palliative care service follow-up  Care plan was discussed with Dr. Posey Pronto and daughter [name] as well as bedside RN.  Thank you for allowing the Palliative Medicine Team to assist in the care of this patient.   Time In: 2:30 Time Out: 3:15 Total Time 45 min Prolonged Time Billed no      Greater than 50%  of this time was spent counseling and coordinating care related to the above assessment and plan.  Imogene Burn, PA-C Palliative Medicine   Please contact Palliative MedicineTeam phone at [phone] for questions and  concerns.   Please see AMION for individual provider pager numbers.

## 2016-04-01 NOTE — Progress Notes (Signed)
CSW will continue to follow for possible SNF placement if appropriate- pt discussed in progression meeting- awaiting final oncology and palliative recommendations for pt to determine placement needs  Jorge Ny, [place] Social Worker [phone]

## 2016-04-01 NOTE — Progress Notes (Signed)
High resolution head CT appears to confirm 2 brain metastases.  Given the limited number and small size, these appear amenable to [facility] versus whole brain radiotherapy.  In this case, SRS is likely preferable for improved local control, logistic advantage of single treatment, and reduced neurocognitive effects.  Will discuss result with daughter and make plans regarding treatment.

## 2016-04-01 NOTE — Progress Notes (Signed)
CRITICAL VALUE ALERT  Critical value received:  Lactic acid  Date of notification:  04/01/2016  Time of notification:  [id number]  Critical value read back:yes  Nurse who received alert:  Kyung Rudd, RN  MD notified (1st page): Raliegh Ip. Schorr     Time of first page:  1850  MD notified (2nd page):  Time of second page:  Responding MD:    Time MD responded:

## 2016-04-01 NOTE — Progress Notes (Signed)
Responded to consult. Visited with and prayed for patient with family while nurse also present. Patient appeared to be soundly asleep. Family said patient was much better (more coherent) yesterday, and, though somewhat deaf, that he could hear if you got right up to his left ear and spoke into it.Bonney Roussel available for follow-up.   04/01/16 1600  Clinical Encounter Type  Visited With Patient and family together  Visit Type Initial;Spiritual support  Referral From Nurse  Spiritual Encounters  Spiritual Needs Prayer;Emotional  Stress Factors  Patient Stress Factors Health changes;Loss of control  Family Stress Factors Family relationships;Loss of control

## 2016-04-02 ENCOUNTER — Inpatient Hospital Stay (HOSPITAL_COMMUNITY): Payer: Medicare Other

## 2016-04-02 ENCOUNTER — Ambulatory Visit: Payer: Medicare Other | Admitting: Radiation Oncology

## 2016-04-02 DIAGNOSIS — I2699 Other pulmonary embolism without acute cor pulmonale: Secondary | ICD-10-CM

## 2016-04-02 DIAGNOSIS — D090 Carcinoma in situ of bladder: Secondary | ICD-10-CM

## 2016-04-02 DIAGNOSIS — T80211A Bloodstream infection due to central venous catheter, initial encounter: Secondary | ICD-10-CM

## 2016-04-02 DIAGNOSIS — A4101 Sepsis due to Methicillin susceptible Staphylococcus aureus: Secondary | ICD-10-CM

## 2016-04-02 DIAGNOSIS — B9561 Methicillin susceptible Staphylococcus aureus infection as the cause of diseases classified elsewhere: Secondary | ICD-10-CM

## 2016-04-02 DIAGNOSIS — D65 Disseminated intravascular coagulation [defibrination syndrome]: Secondary | ICD-10-CM

## 2016-04-02 DIAGNOSIS — R229 Localized swelling, mass and lump, unspecified: Secondary | ICD-10-CM

## 2016-04-02 DIAGNOSIS — R7881 Bacteremia: Secondary | ICD-10-CM

## 2016-04-02 DIAGNOSIS — Y712 Prosthetic and other implants, materials and accessory cardiovascular devices associated with adverse incidents: Secondary | ICD-10-CM

## 2016-04-02 DIAGNOSIS — C7931 Secondary malignant neoplasm of brain: Secondary | ICD-10-CM

## 2016-04-02 DIAGNOSIS — I2690 Septic pulmonary embolism without acute cor pulmonale: Secondary | ICD-10-CM

## 2016-04-02 DIAGNOSIS — D110 Benign neoplasm of parotid gland: Secondary | ICD-10-CM

## 2016-04-02 DIAGNOSIS — G9340 Encephalopathy, unspecified: Secondary | ICD-10-CM | POA: Diagnosis present

## 2016-04-02 DIAGNOSIS — A4102 Sepsis due to Methicillin resistant Staphylococcus aureus: Secondary | ICD-10-CM

## 2016-04-02 DIAGNOSIS — IMO0002 Reserved for concepts with insufficient information to code with codable children: Secondary | ICD-10-CM

## 2016-04-02 DIAGNOSIS — T827XXA Infection and inflammatory reaction due to other cardiac and vascular devices, implants and grafts, initial encounter: Secondary | ICD-10-CM

## 2016-04-02 DIAGNOSIS — R4182 Altered mental status, unspecified: Secondary | ICD-10-CM

## 2016-04-02 LAB — BLOOD CULTURE ID PANEL (REFLEXED)
Acinetobacter baumannii: NOT DETECTED
CANDIDA GLABRATA: NOT DETECTED
CANDIDA TROPICALIS: NOT DETECTED
Candida albicans: NOT DETECTED
Candida krusei: NOT DETECTED
Candida parapsilosis: NOT DETECTED
Enterobacter cloacae complex: NOT DETECTED
Enterobacteriaceae species: NOT DETECTED
Enterococcus species: NOT DETECTED
Escherichia coli: NOT DETECTED
HAEMOPHILUS INFLUENZAE: NOT DETECTED
KLEBSIELLA PNEUMONIAE: NOT DETECTED
Klebsiella oxytoca: NOT DETECTED
Listeria monocytogenes: NOT DETECTED
METHICILLIN RESISTANCE: DETECTED — AB
NEISSERIA MENINGITIDIS: NOT DETECTED
PROTEUS SPECIES: NOT DETECTED
Pseudomonas aeruginosa: NOT DETECTED
SERRATIA MARCESCENS: NOT DETECTED
STAPHYLOCOCCUS AUREUS BCID: DETECTED — AB
STAPHYLOCOCCUS SPECIES: DETECTED — AB
STREPTOCOCCUS SPECIES: NOT DETECTED
Streptococcus agalactiae: NOT DETECTED
Streptococcus pneumoniae: NOT DETECTED
Streptococcus pyogenes: NOT DETECTED

## 2016-04-02 LAB — COMPREHENSIVE METABOLIC PANEL
ALK PHOS: 89 U/L (ref 38–126)
ALT: 17 U/L (ref 17–63)
AST: 18 U/L (ref 15–41)
Albumin: 1.9 g/dL — ABNORMAL LOW (ref 3.5–5.0)
Anion gap: 8 (ref 5–15)
BUN: 27 mg/dL — AB (ref 6–20)
CALCIUM: 8.3 mg/dL — AB (ref 8.9–10.3)
CHLORIDE: 91 mmol/L — AB (ref 101–111)
CO2: 29 mmol/L (ref 22–32)
CREATININE: 1.15 mg/dL (ref 0.61–1.24)
GFR calc non Af Amer: 57 mL/min — ABNORMAL LOW (ref >60)
GLUCOSE: 197 mg/dL — AB (ref 65–99)
Potassium: 5 mmol/L (ref 3.5–5.1)
SODIUM: 128 mmol/L — AB (ref 135–145)
Total Bilirubin: 0.7 mg/dL (ref 0.3–1.2)
Total Protein: 4.9 g/dL — ABNORMAL LOW (ref 6.5–8.1)

## 2016-04-02 LAB — CBC WITH DIFFERENTIAL/PLATELET
BASOS PCT: 0 %
Basophils Absolute: 0 10*3/uL (ref 0.0–0.1)
EOS ABS: 0 10*3/uL (ref 0.0–0.7)
EOS PCT: 0 %
HCT: 35.3 % — ABNORMAL LOW (ref 39.0–52.0)
HEMOGLOBIN: 12.7 g/dL — AB (ref 13.0–17.0)
LYMPHS PCT: 3 %
Lymphs Abs: 0.2 10*3/uL — ABNORMAL LOW (ref 0.7–4.0)
MCH: 36.5 pg — AB (ref 26.0–34.0)
MCHC: 36 g/dL (ref 30.0–36.0)
MCV: 101.4 fL — AB (ref 78.0–100.0)
MONO ABS: 0.2 10*3/uL (ref 0.1–1.0)
Monocytes Relative: 4 %
NEUTROS PCT: 93 %
Neutro Abs: 5.7 10*3/uL (ref 1.7–7.7)
PLATELETS: 33 10*3/uL — AB (ref 150–400)
RBC: 3.48 MIL/uL — AB (ref 4.22–5.81)
RDW: 12.7 % (ref 11.5–15.5)
WBC MORPHOLOGY: INCREASED
WBC: 6.1 10*3/uL (ref 4.0–10.5)

## 2016-04-02 LAB — LACTIC ACID, PLASMA: Lactic Acid, Venous: 2 mmol/L (ref 0.5–1.9)

## 2016-04-02 LAB — GLUCOSE, CAPILLARY
GLUCOSE-CAPILLARY: 239 mg/dL — AB (ref 65–99)
GLUCOSE-CAPILLARY: 292 mg/dL — AB (ref 65–99)
Glucose-Capillary: 225 mg/dL — ABNORMAL HIGH (ref 65–99)
Glucose-Capillary: 270 mg/dL — ABNORMAL HIGH (ref 65–99)

## 2016-04-02 LAB — SODIUM: Sodium: 130 mmol/L — ABNORMAL LOW (ref 135–145)

## 2016-04-02 LAB — TYPE AND SCREEN
ABO/RH(D): AB POS
Antibody Screen: NEGATIVE

## 2016-04-02 LAB — MAGNESIUM: Magnesium: 1.2 mg/dL — ABNORMAL LOW (ref 1.7–2.4)

## 2016-04-02 LAB — PROCALCITONIN: Procalcitonin: 7.92 ng/mL

## 2016-04-02 LAB — PROTIME-INR
INR: 1.18
Prothrombin Time: 15.1 seconds (ref 11.4–15.2)

## 2016-04-02 MED ORDER — LEVETIRACETAM 500 MG PO TABS
500.0000 mg | ORAL_TABLET | Freq: Two times a day (BID) | ORAL | Status: DC
  Administered 2016-04-02 – 2016-04-08 (×12): 500 mg via ORAL
  Filled 2016-04-02 (×16): qty 1

## 2016-04-02 MED ORDER — FOLIC ACID 1 MG PO TABS
1.0000 mg | ORAL_TABLET | Freq: Every day | ORAL | Status: DC
  Administered 2016-04-02 – 2016-04-08 (×6): 1 mg via ORAL
  Filled 2016-04-02 (×7): qty 1

## 2016-04-02 MED ORDER — MAGNESIUM SULFATE 2 GM/50ML IV SOLN
2.0000 g | Freq: Once | INTRAVENOUS | Status: AC
  Administered 2016-04-02: 2 g via INTRAVENOUS
  Filled 2016-04-02: qty 50

## 2016-04-02 MED ORDER — SODIUM CHLORIDE 0.9 % IV BOLUS (SEPSIS)
250.0000 mL | Freq: Once | INTRAVENOUS | Status: AC
  Administered 2016-04-02: 250 mL via INTRAVENOUS

## 2016-04-02 MED ORDER — VITAMIN B-12 1000 MCG PO TABS
1000.0000 ug | ORAL_TABLET | Freq: Every day | ORAL | Status: DC
  Administered 2016-04-02 – 2016-04-08 (×6): 1000 ug via ORAL
  Filled 2016-04-02 (×7): qty 1

## 2016-04-02 NOTE — Progress Notes (Signed)
EEG Completed; Results Pending  

## 2016-04-02 NOTE — Consult Note (Signed)
Date of Admission:  03/25/2016  Date of Consult:  04/02/2016  Reason for Consult: MRSA bacteremia in patient with Port-A-Cath PICC line and pacemaker Referring Physician: "[name] consult from [facility]" and dr. Posey Pronto   HPI: [name] is an 82 y.o. 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 He improved after antiepileptics and steroids and was discharged on 9/13 to [facility] for rehab. He apparently saw Neurosurgery as an outpatient and no changes were made to his medications as he remained seizure free and he was scheduled to have a repeat CT head with Oncology for further management but was readmitted on 10/4 after he "lost all strength" while ambulating to the bathroom and was unable to get up. During this admission he has undergone repeat CT head which showed a new right frontal lobe focus, resolution of the previous right frontal lobe mass, and a stable left occipital lobe mass.  Oncology radiation neurology and neurosurgery felt these lesions were likely metastases and he was treated with steroids and anticonvulsants. He also developed pleuritic chest pain and was found to have multiple pulmonary emboli on CT angiogram. Since then he has become febrile and more confused and was started on empiric antibiotics for possible bacterial meningitis.  Blood cultures have come back positive for MRSA in 2 out of 2 cultures.  He has had fairly severe thrombus cytopenia in the setting of his sepsis. He is being seen by interventional radiology to remove his Port-A-Cath and PICC line. I have called electrophysiology discuss management of his pacemaker which is by definition infected with staph aureus  as well.    Past Medical History:  Diagnosis Date  . Allergy   . Arthritis    degenerative-knees  . Atrial fibrillation ([place]) 1990  . Bladder cancer ([place]) 2010  . Cancer ([place]) 05/05/07-06/15/07   parotid gland/66.4 GY  . Cancer ([place])    left eyelid   . Chronic kidney disease    positive cytology  . Coronary artery disease   . Diabetes mellitus without complication ([place])    meds d/c  2-3 year ago  . Dry eyes   . Dyslipidemia   . Dysrhythmia    HX A FIB  . GERD (gastroesophageal reflux disease)   . Heart murmur    2/6 systolic  . History of chemotherapy     Hx carboplatin/37f  . History of radiation therapy 05/05/07-06/15/07   right parotid through subclavicular region/ mid jugularlymph node chain  . History of radiation therapy 05/05/07-06/15/07   Right parotid/hemineck,supraclavicular  . HOH (hard of hearing)   . Hypertension    labile  . Pacemaker    2003 vent paced , rate 69  . Pneumonia    hx of   . Prostate hypertrophy   . Right groin hernia   . Skin cancer    HX BASAL CELL REMOVED  . Stroke ([facility] 2003   TIA  . Tinnitus    right   . Urinary tract infection    hx of   . Xerostomia    limited    Past Surgical History:  Procedure Laterality Date  . BACK SURGERY    . CARDIAC CATHETERIZATION  09/20/2012   EF 50-55%, moderately calcified aortic  valve leaflets, mild-moderate aortic valve stenosis, LA-appendage moderately dilated, moderate regurg of the mitral valve, RA moderately dilated  . CARDIOVASCULAR STRESS TEST  09/05/2009   Pharmalogical stress test w/o chest pain or EKG changes for ischemia  . CRANIOTOMY  12/14/2011   Procedure: CRANIOTOMY HEMATOMA EVACUATION SUBDURAL;  Surgeon: Ophelia Charter, MD;  Location: [place] [facility];  Service: Neurosurgery;  Laterality: Left;  LEFT Craniotomy for subdural   . CYSTOSCOPY  01/2011   neg  . CYSTOSCOPY W/ RETROGRADES  04/01/2012   Procedure: CYSTOSCOPY WITH RETROGRADE PYELOGRAM;  Surgeon: Claybon Jabs, MD;   Location: [facility];  Service: Urology;  Laterality: Bilateral;  . CYSTOSCOPY WITH BIOPSY  04/01/2012   Procedure: CYSTOSCOPY WITH BIOPSY;  Surgeon: Claybon Jabs, MD;  Location: [facility];  Service: Urology;  Laterality: N/A;  BLADDER BIOPSIES and bladder washings  . CYSTOSCOPY WITH BIOPSY  05/02/2012   Procedure: CYSTOSCOPY WITH BIOPSY;  Surgeon: Claybon Jabs, MD;  Location: [facility];  Service: Urology;  Laterality: N/A;  . CYSTOSCOPY WITH STENT PLACEMENT  05/02/2012   Procedure: CYSTOSCOPY WITH STENT PLACEMENT;  Surgeon: Claybon Jabs, MD;  Location: [facility];  Service: Urology;  Laterality: Bilateral;  . CYSTOSCOPY WITH URETEROSCOPY AND STENT PLACEMENT Right 02/15/2015   Procedure: CYSTOSCOPY WITH RIGHT URETEROSCOPY, RENAL PELVIC WASHINGS, STENT, RETROGRADE;  Surgeon: Kathie Rhodes, MD;  Location: [facility];  Service: Urology;  Laterality: Right;  . CYSTOSCOPY/RETROGRADE/URETEROSCOPY  05/02/2012   Procedure: CYSTOSCOPY/RETROGRADE/URETEROSCOPY;  Surgeon: Claybon Jabs, MD;  Location: [facility];  Service: Urology;  Laterality: Bilateral;  CYSTOSCOPY BILATERAL RETROGRADE PYLOGRAM BILATERAL URETEROSCOPY AND BIOPSY    . CYSTOSCOPY/RETROGRADE/URETEROSCOPY Bilateral 01/21/2015   Procedure: CYSTOSCOPY/RETROGRADE/ BLADDER BIOPSY;  Surgeon: Kathie Rhodes, MD;  Location: [facility];  Service: Urology;  Laterality: Bilateral;  . IR GENERIC HISTORICAL  03/31/2016   IR IVC FILTER PLMT / S&I Burke Keels GUID/MOD SED 03/31/2016 Arne Cleveland, MD [facility]  . IR GENERIC HISTORICAL  03/31/2016   IR FLUORO GUIDE CV LINE RIGHT 03/31/2016 Arne Cleveland, MD [facility]  . LOWER EXTREMITY ARTERIAL DOPPLER  09/28/2011   No evidence of arterial insufficiency.   . [place]  . PACEMAKER GENERATOR CHANGE  05/23/2002   [facility], [place], serial [phone]  . PACEMAKER GENERATOR CHANGE N/A 09/01/2013    Procedure: PACEMAKER GENERATOR CHANGE;  Surgeon: Sanda Klein, MD;  Location: [place] [facility]ATH [facility];  Service: Cardiovascular;  Laterality: N/A;  . [place]   last changed 2003  . PAROTIDECTOMY     right  . URETEROSCOPY  04/01/2012   Procedure: URETEROSCOPY;  Surgeon: Claybon Jabs, MD;  Location: [facility];  Service: Urology;  Laterality: Right;    Social History:  reports that he has never smoked. He has never used smokeless tobacco. He reports that he drinks about 10.5 oz of alcohol per week . He reports that he does not use drugs.   Family History  Problem Relation Age of Onset  . Heart disease Mother   . Cancer Father     Lung cancer  . Emphysema Brother   . Coronary artery disease Son     Allergies  Allergen Reactions  . Codeine Other (See Comments)    Blisters between fingers  . Docetaxel Rash     Medications: I have reviewed patients current medications as documented in Epic Anti-infectives    Start     Dose/Rate  Route Frequency Ordered Stop   04/01/16 1800  cefTRIAXone (ROCEPHIN) 2 g in dextrose 5 % 50 mL IVPB  Status:  Discontinued     2 g 100 mL/hr over 30 Minutes Intravenous Every 12 hours 04/01/16 1717 04/02/16 0810   04/01/16 1800  ampicillin (OMNIPEN) 2 g in sodium chloride 0.9 % 50 mL IVPB  Status:  Discontinued     2 g 150 mL/hr over 20 Minutes Intravenous Every 4 hours 04/01/16 1717 04/02/16 0810   04/01/16 1800  vancomycin (VANCOCIN) IVPB 750 mg/150 ml premix     750 mg 150 mL/hr over 60 Minutes Intravenous Every 12 hours 04/01/16 1731     04/01/16 1730  vancomycin (VANCOCIN) IVPB 1000 mg/200 mL premix  Status:  Discontinued     1,000 mg 200 mL/hr over 60 Minutes Intravenous  Once 04/01/16 1717 04/01/16 1722         ROS:  as in HPI otherwise remainder of 12 point Review of Systems is not obtainable due to patient's confusion   Blood pressure 106/62, pulse 61, temperature 99.5 F (37.5 C), temperature source Oral,  resp. rate 18, height 5' 9" (1.753 m), weight 189 lb 6 oz (85.9 kg), SpO2 96 %. General: Alert and awake,oriented to person but otherwise confused, ill appearing  HEENT: anicteric sclera,  EOMI, oropharynx dry Cardiovascular: irr, irr, bardycardic ,no murmur rubs or gallops Pulmonary: clear to auscultation bilaterally, no wheezing, rales or rhonchi Gastrointestinal: soft nontender, nondistended, normal bowel sounds, Musculoskeletal: no  clubbing or edema noted bilaterally Skin: multiple ecchymoses, PM site not obviously infected, PICC and porta cath in place Neuro: nonfocal, strength and sensation intact   Results for orders placed or performed during the hospital encounter of 03/25/16 (from the past 48 hour(s))  Glucose, capillary     Status: Abnormal   Collection Time: 03/31/16  5:15 PM  Result Value Ref Range   Glucose-Capillary 33 (LL) 65 - 99 mg/dL   Comment 1 Notify RN   Glucose, capillary     Status: Abnormal   Collection Time: 03/31/16  5:18 PM  Result Value Ref Range   Glucose-Capillary 62 (L) 65 - 99 mg/dL  Glucose, capillary     Status: Abnormal   Collection Time: 03/31/16  5:53 PM  Result Value Ref Range   Glucose-Capillary 64 (L) 65 - 99 mg/dL  Glucose, capillary     Status: Abnormal   Collection Time: 03/31/16  6:43 PM  Result Value Ref Range   Glucose-Capillary 62 (L) 65 - 99 mg/dL  Glucose, capillary     Status: None   Collection Time: 03/31/16  6:48 PM  Result Value Ref Range   Glucose-Capillary 87 65 - 99 mg/dL  Glucose, capillary     Status: None   Collection Time: 03/31/16 10:39 PM  Result Value Ref Range   Glucose-Capillary 83 65 - 99 mg/dL  Basic metabolic panel     Status: Abnormal   Collection Time: 03/31/16 11:43 PM  Result Value Ref Range   Sodium 130 (L) 135 - 145 mmol/L   Potassium 5.2 (H) 3.5 - 5.1 mmol/L   Chloride 89 (L) 101 - 111 mmol/L   CO2 32 22 - 32 mmol/L   Glucose, Bld 143 (H) 65 - 99 mg/dL   BUN 25 (H) 6 - 20 mg/dL   Creatinine,  Ser 1.04 0.61 - 1.24 mg/dL   Calcium 8.7 (L) 8.9 - 10.3 mg/dL   GFR calc non Af Amer >60 >60 mL/min   GFR  calc Af Amer >60 >60 mL/min    Comment: (NOTE) The eGFR has been calculated using the CKD EPI equation. This calculation has not been validated in all clinical situations. eGFR's persistently <60 mL/min signify possible Chronic Kidney Disease.    Anion gap 9 5 - 15  CBC     Status: Abnormal   Collection Time: 04/01/16 12:45 AM  Result Value Ref Range   WBC 10.0 4.0 - 10.5 K/uL   RBC 3.94 (L) 4.22 - 5.81 MIL/uL   Hemoglobin 14.3 13.0 - 17.0 g/dL   HCT 40.2 39.0 - 52.0 %   MCV 102.0 (H) 78.0 - 100.0 fL   MCH 36.3 (H) 26.0 - 34.0 pg   MCHC 35.6 30.0 - 36.0 g/dL   RDW 12.5 11.5 - 15.5 %   Platelets 53 (L) 150 - 400 K/uL    Comment: CONSISTENT WITH PREVIOUS RESULT  Glucose, capillary     Status: Abnormal   Collection Time: 04/01/16  8:11 AM  Result Value Ref Range   Glucose-Capillary 199 (H) 65 - 99 mg/dL  Glucose, capillary     Status: Abnormal   Collection Time: 04/01/16 12:02 PM  Result Value Ref Range   Glucose-Capillary 193 (H) 65 - 99 mg/dL  Glucose, capillary     Status: Abnormal   Collection Time: 04/01/16  2:48 PM  Result Value Ref Range   Glucose-Capillary 212 (H) 65 - 99 mg/dL  Glucose, capillary     Status: Abnormal   Collection Time: 04/01/16  5:26 PM  Result Value Ref Range   Glucose-Capillary 198 (H) 65 - 99 mg/dL  Culture, blood (routine x 2)     Status: None (Preliminary result)   Collection Time: 04/01/16  5:27 PM  Result Value Ref Range   Specimen Description BLOOD RIGHT ANTECUBITAL    Special Requests BOTTLES DRAWN AEROBIC ONLY 5CC    Culture  Setup Time      GRAM POSITIVE COCCI IN CLUSTERS AEROBIC BOTTLE ONLY Organism ID to follow CRITICAL RESULT CALLED TO, READ BACK BY AND VERIFIED WITH: M MACCIA,PHARMD AT 3300 04/02/16 BY L BENFIELD    Culture GRAM POSITIVE COCCI    Report Status PENDING   Blood Culture ID Panel (Reflexed)     Status:  Abnormal   Collection Time: 04/01/16  5:27 PM  Result Value Ref Range   Enterococcus species NOT DETECTED NOT DETECTED   Listeria monocytogenes NOT DETECTED NOT DETECTED   Staphylococcus species DETECTED (A) NOT DETECTED    Comment: CRITICAL RESULT CALLED TO, READ BACK BY AND VERIFIED WITH: M MACCIA,PHARMD AT 7622 04/02/16 BY L BENFIELD    Staphylococcus aureus DETECTED (A) NOT DETECTED    Comment: CRITICAL RESULT CALLED TO, READ BACK BY AND VERIFIED WITH: M MACCIA,PHARMD AT 6333 04/02/16 BY L BENFIELD    Methicillin resistance DETECTED (A) NOT DETECTED    Comment: CRITICAL RESULT CALLED TO, READ BACK BY AND VERIFIED WITH: M MACCIA,PHARMD AT 0804 04/02/16 BY L BENFIELD    Streptococcus species NOT DETECTED NOT DETECTED   Streptococcus agalactiae NOT DETECTED NOT DETECTED   Streptococcus pneumoniae NOT DETECTED NOT DETECTED   Streptococcus pyogenes NOT DETECTED NOT DETECTED   Acinetobacter baumannii NOT DETECTED NOT DETECTED   Enterobacteriaceae species NOT DETECTED NOT DETECTED   Enterobacter cloacae complex NOT DETECTED NOT DETECTED   Escherichia coli NOT DETECTED NOT DETECTED   Klebsiella oxytoca NOT DETECTED NOT DETECTED   Klebsiella pneumoniae NOT DETECTED NOT DETECTED   Proteus species NOT DETECTED NOT DETECTED  Serratia marcescens NOT DETECTED NOT DETECTED   Haemophilus influenzae NOT DETECTED NOT DETECTED   Neisseria meningitidis NOT DETECTED NOT DETECTED   Pseudomonas aeruginosa NOT DETECTED NOT DETECTED   Candida albicans NOT DETECTED NOT DETECTED   Candida glabrata NOT DETECTED NOT DETECTED   Candida krusei NOT DETECTED NOT DETECTED   Candida parapsilosis NOT DETECTED NOT DETECTED   Candida tropicalis NOT DETECTED NOT DETECTED  CBC with Differential/Platelet     Status: Abnormal   Collection Time: 04/01/16  5:36 PM  Result Value Ref Range   WBC 8.6 4.0 - 10.5 K/uL   RBC 3.76 (L) 4.22 - 5.81 MIL/uL   Hemoglobin 13.6 13.0 - 17.0 g/dL   HCT 38.3 (L) 39.0 - 52.0 %    MCV 101.9 (H) 78.0 - 100.0 fL   MCH 36.2 (H) 26.0 - 34.0 pg   MCHC 35.5 30.0 - 36.0 g/dL   RDW 12.6 11.5 - 15.5 %   Platelets 49 (L) 150 - 400 K/uL    Comment: REPEATED TO VERIFY CONSISTENT WITH PREVIOUS RESULT    Neutrophils Relative % 95 %   Lymphocytes Relative 2 %   Monocytes Relative 3 %   Eosinophils Relative 0 %   Basophils Relative 0 %   Neutro Abs 8.1 (H) 1.7 - 7.7 K/uL   Lymphs Abs 0.2 (L) 0.7 - 4.0 K/uL   Monocytes Absolute 0.3 0.1 - 1.0 K/uL   Eosinophils Absolute 0.0 0.0 - 0.7 K/uL   Basophils Absolute 0.0 0.0 - 0.1 K/uL   RBC Morphology BURR CELLS    WBC Morphology INCREASED BANDS (>20% BANDS)     Comment: TOXIC GRANULATION DOHLE BODIES   Comprehensive metabolic panel     Status: Abnormal   Collection Time: 04/01/16  5:36 PM  Result Value Ref Range   Sodium 127 (L) 135 - 145 mmol/L   Potassium 5.5 (H) 3.5 - 5.1 mmol/L   Chloride 89 (L) 101 - 111 mmol/L   CO2 27 22 - 32 mmol/L   Glucose, Bld 212 (H) 65 - 99 mg/dL   BUN 29 (H) 6 - 20 mg/dL   Creatinine, Ser 1.26 (H) 0.61 - 1.24 mg/dL   Calcium 8.7 (L) 8.9 - 10.3 mg/dL   Total Protein 5.2 (L) 6.5 - 8.1 g/dL   Albumin 2.2 (L) 3.5 - 5.0 g/dL   AST 22 15 - 41 U/L   ALT 18 17 - 63 U/L   Alkaline Phosphatase 96 38 - 126 U/L   Total Bilirubin 0.9 0.3 - 1.2 mg/dL   GFR calc non Af Amer 51 (L) >60 mL/min   GFR calc Af Amer 59 (L) >60 mL/min    Comment: (NOTE) The eGFR has been calculated using the CKD EPI equation. This calculation has not been validated in all clinical situations. eGFR's persistently <60 mL/min signify possible Chronic Kidney Disease.    Anion gap 11 5 - 15  Lactic acid, plasma     Status: Abnormal   Collection Time: 04/01/16  5:36 PM  Result Value Ref Range   Lactic Acid, Venous 3.2 (HH) 0.5 - 1.9 mmol/L    Comment: CRITICAL RESULT CALLED TO, READ BACK BY AND VERIFIED WITH: K. [facility] RN [id number] 1912 GREEN R   Ammonia     Status: None   Collection Time: 04/01/16  5:36 PM  Result Value Ref  Range   Ammonia 29 9 - 35 umol/L  Folate     Status: Abnormal   Collection Time: 04/01/16  5:36  PM  Result Value Ref Range   Folate 5.9 (L) >5.9 ng/mL  Vitamin B12     Status: None   Collection Time: 04/01/16  5:36 PM  Result Value Ref Range   Vitamin B-12 572 180 - 914 pg/mL    Comment: (NOTE) This assay is not validated for testing neonatal or myeloproliferative syndrome specimens for Vitamin B12 levels.   Culture, blood (routine x 2)     Status: None (Preliminary result)   Collection Time: 04/01/16  5:40 PM  Result Value Ref Range   Specimen Description BLOOD LEFT ARM    Special Requests IN PEDIATRIC BOTTLE 4CC    Culture  Setup Time      GRAM POSITIVE COCCI IN CLUSTERS IN PEDIATRIC BOTTLE CRITICAL VALUE NOTED.  VALUE IS CONSISTENT WITH PREVIOUSLY REPORTED AND CALLED VALUE.    Culture GRAM POSITIVE COCCI    Report Status PENDING   Sodium     Status: Abnormal   Collection Time: 04/01/16  7:57 PM  Result Value Ref Range   Sodium 128 (L) 135 - 145 mmol/L  Glucose, capillary     Status: Abnormal   Collection Time: 04/01/16  9:49 PM  Result Value Ref Range   Glucose-Capillary 179 (H) 65 - 99 mg/dL  Sodium     Status: Abnormal   Collection Time: 04/01/16 10:58 PM  Result Value Ref Range   Sodium 127 (L) 135 - 145 mmol/L  CBC with Differential/Platelet     Status: Abnormal   Collection Time: 04/02/16  3:22 AM  Result Value Ref Range   WBC 6.1 4.0 - 10.5 K/uL   RBC 3.48 (L) 4.22 - 5.81 MIL/uL   Hemoglobin 12.7 (L) 13.0 - 17.0 g/dL   HCT 35.3 (L) 39.0 - 52.0 %   MCV 101.4 (H) 78.0 - 100.0 fL   MCH 36.5 (H) 26.0 - 34.0 pg   MCHC 36.0 30.0 - 36.0 g/dL   RDW 12.7 11.5 - 15.5 %   Platelets 33 (L) 150 - 400 K/uL    Comment: REPEATED TO VERIFY CRITICAL VALUE NOTED.  VALUE IS CONSISTENT WITH PREVIOUSLY REPORTED AND CALLED VALUE.    Neutrophils Relative % 93 %   Lymphocytes Relative 3 %   Monocytes Relative 4 %   Eosinophils Relative 0 %   Basophils Relative 0 %   Neutro  Abs 5.7 1.7 - 7.7 K/uL   Lymphs Abs 0.2 (L) 0.7 - 4.0 K/uL   Monocytes Absolute 0.2 0.1 - 1.0 K/uL   Eosinophils Absolute 0.0 0.0 - 0.7 K/uL   Basophils Absolute 0.0 0.0 - 0.1 K/uL   WBC Morphology INCREASED BANDS (>20% BANDS)     Comment: MILD LEFT SHIFT (1-5% METAS, OCC MYELO, OCC BANDS) VACUOLATED NEUTROPHILS DOHLE BODIES   Comprehensive metabolic panel     Status: Abnormal   Collection Time: 04/02/16  3:22 AM  Result Value Ref Range   Sodium 128 (L) 135 - 145 mmol/L   Potassium 5.0 3.5 - 5.1 mmol/L   Chloride 91 (L) 101 - 111 mmol/L   CO2 29 22 - 32 mmol/L   Glucose, Bld 197 (H) 65 - 99 mg/dL   BUN 27 (H) 6 - 20 mg/dL   Creatinine, Ser 1.15 0.61 - 1.24 mg/dL   Calcium 8.3 (L) 8.9 - 10.3 mg/dL   Total Protein 4.9 (L) 6.5 - 8.1 g/dL   Albumin 1.9 (L) 3.5 - 5.0 g/dL   AST 18 15 - 41 U/L   ALT 17 17 -  63 U/L   Alkaline Phosphatase 89 38 - 126 U/L   Total Bilirubin 0.7 0.3 - 1.2 mg/dL   GFR calc non Af Amer 57 (L) >60 mL/min   GFR calc Af Amer >60 >60 mL/min    Comment: (NOTE) The eGFR has been calculated using the CKD EPI equation. This calculation has not been validated in all clinical situations. eGFR's persistently <60 mL/min signify possible Chronic Kidney Disease.    Anion gap 8 5 - 15  Magnesium     Status: Abnormal   Collection Time: 04/02/16  3:22 AM  Result Value Ref Range   Magnesium 1.2 (L) 1.7 - 2.4 mg/dL  Protime-INR     Status: None   Collection Time: 04/02/16  3:22 AM  Result Value Ref Range   Prothrombin Time 15.1 11.4 - 15.2 seconds   INR 1.18   Sodium     Status: Abnormal   Collection Time: 04/02/16  7:20 AM  Result Value Ref Range   Sodium 130 (L) 135 - 145 mmol/L  Procalcitonin     Status: None   Collection Time: 04/02/16  7:20 AM  Result Value Ref Range   Procalcitonin 7.92 ng/mL    Comment:        Interpretation: PCT > 2 ng/mL: Systemic infection (sepsis) is likely, unless other causes are known. (NOTE)         ICU PCT Algorithm                Non ICU PCT Algorithm    ----------------------------     ------------------------------         PCT < 0.25 ng/mL                 PCT < 0.1 ng/mL     Stopping of antibiotics            Stopping of antibiotics       strongly encouraged.               strongly encouraged.    ----------------------------     ------------------------------       PCT level decrease by               PCT < 0.25 ng/mL       >= 80% from peak PCT       OR PCT 0.25 - 0.5 ng/mL          Stopping of antibiotics                                             encouraged.     Stopping of antibiotics           encouraged.    ----------------------------     ------------------------------       PCT level decrease by              PCT >= 0.25 ng/mL       < 80% from peak PCT        AND PCT >= 0.5 ng/mL            Continuing antibiotics  encouraged.       Continuing antibiotics            encouraged.    ----------------------------     ------------------------------     PCT level increase compared          PCT > 0.5 ng/mL         with peak PCT AND          PCT >= 0.5 ng/mL             Escalation of antibiotics                                          strongly encouraged.      Escalation of antibiotics        strongly encouraged.   Lactic acid, plasma     Status: Abnormal   Collection Time: 04/02/16  8:19 AM  Result Value Ref Range   Lactic Acid, Venous 2.0 (HH) 0.5 - 1.9 mmol/L    Comment: CRITICAL RESULT CALLED TO, READ BACK BY AND VERIFIED WITH: KELLY DUFFY,RN AT 0946 04/02/16 BY ZBEECH   Type and screen [place]     Status: None   Collection Time: 04/02/16  8:19 AM  Result Value Ref Range   ABO/RH(D) AB POS    Antibody Screen NEG    Sample Expiration 04/05/2016   Glucose, capillary     Status: Abnormal   Collection Time: 04/02/16  8:28 AM  Result Value Ref Range   Glucose-Capillary 225 (H) 65 - 99 mg/dL  Glucose, capillary     Status:  Abnormal   Collection Time: 04/02/16 12:06 PM  Result Value Ref Range   Glucose-Capillary 239 (H) 65 - 99 mg/dL   _0 (sdes,specrequest,cult,reptstatus)   ) Recent Results (from the past 720 hour(s))  Surgical pcr screen     Status: Abnormal   Collection Time: 03/31/16  4:56 AM  Result Value Ref Range Status   MRSA, PCR POSITIVE (A) NEGATIVE Final    Comment: RESULT CALLED TO, READ BACK BY AND VERIFIED WITH: C DANIELS,RN _1  03/31/16 MKELLY,MLT    Staphylococcus aureus POSITIVE (A) NEGATIVE Final    Comment:        The Xpert SA Assay (FDA approved for NASAL specimens in patients over 29 years of age), is one component of a comprehensive surveillance program.  Test performance has been validated by [facility] for patients greater than or equal to 71 year old. It is not intended to diagnose infection nor to guide or monitor treatment.   Culture, blood (routine x 2)     Status: None (Preliminary result)   Collection Time: 04/01/16  5:27 PM  Result Value Ref Range Status   Specimen Description BLOOD RIGHT ANTECUBITAL  Final   Special Requests BOTTLES DRAWN AEROBIC ONLY 5CC  Final   Culture  Setup Time   Final    GRAM POSITIVE COCCI IN CLUSTERS AEROBIC BOTTLE ONLY Organism ID to follow CRITICAL RESULT CALLED TO, READ BACK BY AND VERIFIED WITH: Ailene Rud AT 8768 04/02/16 BY L BENFIELD    Culture GRAM POSITIVE COCCI  Final   Report Status PENDING  Incomplete  Blood Culture ID Panel (Reflexed)     Status: Abnormal   Collection Time: 04/01/16  5:27 PM  Result Value Ref Range Status   Enterococcus species NOT DETECTED NOT DETECTED Final   Listeria monocytogenes  NOT DETECTED NOT DETECTED Final   Staphylococcus species DETECTED (A) NOT DETECTED Final    Comment: CRITICAL RESULT CALLED TO, READ BACK BY AND VERIFIED WITH: M MACCIA,PHARMD AT 4315 04/02/16 BY L BENFIELD    Staphylococcus aureus DETECTED (A) NOT DETECTED Final    Comment: CRITICAL RESULT CALLED  TO, READ BACK BY AND VERIFIED WITH: M MACCIA,PHARMD AT 4008 04/02/16 BY L BENFIELD    Methicillin resistance DETECTED (A) NOT DETECTED Final    Comment: CRITICAL RESULT CALLED TO, READ BACK BY AND VERIFIED WITH: M MACCIA,PHARMD AT 0804 04/02/16 BY L BENFIELD    Streptococcus species NOT DETECTED NOT DETECTED Final   Streptococcus agalactiae NOT DETECTED NOT DETECTED Final   Streptococcus pneumoniae NOT DETECTED NOT DETECTED Final   Streptococcus pyogenes NOT DETECTED NOT DETECTED Final   Acinetobacter baumannii NOT DETECTED NOT DETECTED Final   Enterobacteriaceae species NOT DETECTED NOT DETECTED Final   Enterobacter cloacae complex NOT DETECTED NOT DETECTED Final   Escherichia coli NOT DETECTED NOT DETECTED Final   Klebsiella oxytoca NOT DETECTED NOT DETECTED Final   Klebsiella pneumoniae NOT DETECTED NOT DETECTED Final   Proteus species NOT DETECTED NOT DETECTED Final   Serratia marcescens NOT DETECTED NOT DETECTED Final   Haemophilus influenzae NOT DETECTED NOT DETECTED Final   Neisseria meningitidis NOT DETECTED NOT DETECTED Final   Pseudomonas aeruginosa NOT DETECTED NOT DETECTED Final   Candida albicans NOT DETECTED NOT DETECTED Final   Candida glabrata NOT DETECTED NOT DETECTED Final   Candida krusei NOT DETECTED NOT DETECTED Final   Candida parapsilosis NOT DETECTED NOT DETECTED Final   Candida tropicalis NOT DETECTED NOT DETECTED Final  Culture, blood (routine x 2)     Status: None (Preliminary result)   Collection Time: 04/01/16  5:40 PM  Result Value Ref Range Status   Specimen Description BLOOD LEFT ARM  Final   Special Requests IN PEDIATRIC BOTTLE 4CC  Final   Culture  Setup Time   Final    GRAM POSITIVE COCCI IN CLUSTERS IN PEDIATRIC BOTTLE CRITICAL VALUE NOTED.  VALUE IS CONSISTENT WITH PREVIOUSLY REPORTED AND CALLED VALUE.    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 male with  Atrial fibrillation, Pacemaker Parotid gland tumor, transitional cell carcinoma, on chemotherapy found to have new necrotic lesions in the brain thought to be malignancy along with pulmonary emboli, with worsening confusion now found to have methicillin resistant Staphylococcus aureus bacteremia and sepsis   #1 MRSA bacteremia, PM infection +/- septic emboli, CNS infection     [place] Antimicrobial Management Team Staphylococcus aureus bacteremia   Staphylococcus aureus bacteremia (SAB) is associated with a high rate of complications and mortality.  Specific aspects of clinical management are critical to optimizing the outcome of patients with SAB.  Therefore, the [facility] Antimicrobial Management Team [facility]) has initiated an intervention aimed at improving the management of SAB at [facility].  To do so, Infectious Diseases physicians are providing an  evidence-based consult for the management of all patients with SAB.     Yes No Comments  Perform follow-up blood cultures (even if the patient is afebrile) to ensure clearance of bacteremia [x] [] These Need be repeated again after his lines are all removed   Remove vascular catheter and obtain follow-up blood cultures after the removal of the catheter [] [] His Port-A-Cath and his PICC line need to be removed and repeat blood cultures taken   Perform echocardiography to evaluate for endocarditis (transthoracic ECHO is 40-50% sensitive, TEE is > 90% sensitive) [] [] Please keep in mind, that neither test can definitively EXCLUDE endocarditis,  and that should clinical suspicion remain high for endocarditis the patient should then still be treated with an "endocarditis" duration of therapy = 6 weeks  I have ordered a 2-D echocardiogram. I don't think a transesophageal echocardiogram will change much to the management of this patient given his poor prognosis.   Consult electrophysiologist to evaluate implanted cardiac device (pacemaker, ICD) [] []  I've consulted electrophysiology given the fact the patient has a pacemaker and by definition has a pacemaker infection with Staphylococcus aureus. Device removal in this patient would seem very high risk. If the device cannot be removed his bacteremia and device infection cannot be cured with antibiotics. We can consider 6 weeks of intravenous therapy followed by lifelong oral antibiotics    Ensure source control [] [] Have all abscesses been drained effectively? Have deep seeded infections (septic joints or osteomyelitis) had appropriate surgical debridement?  Presumed porta cath or PICC could have been source  Investigate for "metastatic" sites of infection [] [] Does the patient have ANY symptom or physical exam finding that would suggest a deeper infection (back or neck pain that may be suggestive of vertebral osteomyelitis or epidural abscess, muscle pain that could be a symptom of pyomyositis)?  Keep in mind that for deep seeded infections MRI imaging with contrast is preferred rather than other often insensitive tests such as plain x-rays, especially early in a patient's presentation.   ? If brain lesions aren't fact metastases versus infection.   Change antibiotic therapy to Vancomycin  [] [] Beta-lactam antibiotics are preferred for MSSA due to higher cure rates.   If on Vancomycin, goal trough should be 15 - 20 mcg/mL  Estimated duration of IV antibiotic therapy:  6 weeks of intravenous vancomycin plus lifelong oral antibiotics if aggressive care is continued  [] [] Consult case  management for probably prolonged outpatient IV antibiotic therapy     #2 Goals of care: STRONGLY RECOMMEND CHANGE OF COURSE TO PURE PALLIATIVE CARE ONE. This man is not going to survive very long and I doubt he will be able  to achieve much in the way of meaningful quality of life. This is a very unfortunate case.   04/02/2016, 4:24 PM   Thank you so much for this interesting consult  [place] for [place] [phone] (pager) [phone] (office) 04/02/2016, 4:24 PM  Rhina [place]ack[place] 04/02/2016, 4:24 PM

## 2016-04-02 NOTE — Progress Notes (Addendum)
      [place] Antimicrobial Management Team Staphylococcus aureus bacteremia   Staphylococcus aureus bacteremia (SAB) is associated with a high rate of complications and mortality.  Specific aspects of clinical management are critical to optimizing the outcome of patients with SAB.  Therefore, the [facility] Antimicrobial Management Team [facility]) has initiated an intervention aimed at improving the management of SAB at [facility].  To do so, Infectious Diseases physicians are providing an evidence-based consult for the management of all patients with SAB.     Yes No Comments  Perform follow-up blood cultures (even if the patient is afebrile) to ensure clearance of bacteremia [x]  []    Remove vascular catheter and obtain follow-up blood cultures after the removal of the catheter [x]  []  HIS PORTA CATH AND PICC NEED TO BE REMOVED  He needs blood cultures repeated after all lines are out, and he needs a central line holida  Perform echocardiography to evaluate for endocarditis (transthoracic ECHO is 40-50% sensitive, TEE is > 90% sensitive) [x]  []  Please keep in mind, that neither test can definitively EXCLUDE endocarditis, and that should clinical suspicion remain high for endocarditis the patient should then still be treated with an "endocarditis" duration of therapy = 6 weeks  Consult electrophysiologist to evaluate implanted cardiac device (pacemaker, ICD) []  []  CORRECTION HE HAS A PACEMAKER (SEE MY FULL NOTE)  Ensure source control []  []  Have all abscesses been drained effectively? Have deep seeded infections (septic joints or osteomyelitis) had appropriate surgical debridement?  NOT CLEAR  Investigate for "metastatic" sites of infection []  []  Does the patient have ANY symptom or physical exam finding that would suggest a deeper infection (back or neck pain that may be suggestive of vertebral osteomyelitis or epidural abscess, muscle pain that could be a symptom of pyomyositis)?  Keep in mind  that for deep seeded infections MRI imaging with contrast is preferred rather than other often insensitive tests such as plain x-rays, especially early in a patient's presentation are brain lesions mets vs septic emboli  Change antibiotic therapy to vancomycin []  []  Beta-lactam antibiotics are preferred for MSSA due to higher cure rates.   If on Vancomycin, goal trough should be 15 - 20 mcg/mL  Estimated duration of IV antibiotic therapy:  6 weeks []  []  Consult case management for probably prolonged outpatient IV antibiotic therapy     FORMAL CONSULT TO COME THIS AFTERNOON

## 2016-04-02 NOTE — Progress Notes (Signed)
Subjective: Somewhat improved today.   Exam: Vitals:   04/02/16 0007 04/02/16 0615  BP: 108/62 97/75  Pulse: (!) 103 (!) 101  Resp: (!) 22 18  Temp:  98.5 F (36.9 C)   Gen: In bed, NAD Resp: non-labored breathing, no acute distress Abd: soft, nt  He still has some neck stiffness.   Neuro: MS: awake, knows that [facility] is at [facility], that the year is 2017, butnot month.  [id number], VFF Motor: AMEW Sensory:intact to LT  Impression: 82 yo M with septic encephaloapthy. His lesions on CT really do not look much like abcesses, and in any case, he is on vancomycin which does penetrate into the CSF. Overall I think that sepsis rather than CNS infection is much more likely.   Recommendations: 1) Treatment of sepsis per primary team.  2) Will decrease keppra back to 500mg  BID 3) No further recommendations at this time. Please call with further questions or concerns.   [name], MD [facility] [phone]  If 7pm- 7am, please page neurology on call as listed in [place].

## 2016-04-02 NOTE — Progress Notes (Signed)
Daily Progress Note   Patient Name: [name]       Date: 04/02/2016 DOB: June 27, 1929  Age: 82 y.o. MRN#: [id number] Attending Physician: Lavina Hamman, MD Primary Care Physician: [name], MD Admit Date: 03/25/2016  Reason for Consultation/Follow-up: Establishing goals of care  Subjective: Patient lethargic but clearer today than yesterday.  Close family friend at bedside.  Patient states "I hurt all over".    I called daughter, [name].  She has a naturally aggressive/defensive personality.  I updated her on his staph bacteremia.  She will return to the hospital tomorrow.  Length of Stay: 7  Current Medications: Scheduled Meds:  . acetaminophen  1,000 mg Oral Q8H  . dexamethasone  4 mg Oral Daily  . feeding supplement (GLUCERNA SHAKE)  237 mL Oral BID BM  . folic acid  1 mg Oral Daily  . insulin aspart  0-15 Units Subcutaneous TID WC  . insulin aspart  0-5 Units Subcutaneous QHS  . insulin glargine  5 Units Subcutaneous QHS  . levETIRAcetam  750 mg Oral BID  . magnesium sulfate 1 - 4 g bolus IVPB  2 g Intravenous Once  . mouth rinse  15 mL Mouth Rinse BID  . pantoprazole  40 mg Oral Daily  . polyethylene glycol  17 g Oral BID  . senna-docusate  2 tablet Oral QHS  . sodium chloride flush  3 mL Intravenous Q12H  . vancomycin  750 mg Intravenous Q12H  . vitamin B-12  1,000 mcg Oral Daily    Continuous Infusions: . sodium chloride 75 mL/hr at 04/01/16 2018    PRN Meds: acetaminophen, ALPRAZolam, morphine injection, ondansetron **[name]** ondansetron (ZOFRAN) IV, prochlorperazine, sodium chloride flush  Physical Exam        Elderly chronically ill appearing male, lethargic, sleeping Resp:  NAD no increased work of breathing, on oxygen N/C CV slightly  tachy Abdomen:soft Extremities:  No edema, able to move all 4   Vital Signs: BP 97/75 (BP Location: Right Arm)   Pulse (!) 101   Temp 98.5 F (36.9 C) (Oral)   Resp 18   Ht 5\' 9"  (1.753 m)   Wt 85.9 kg (189 lb 6 oz)   SpO2 96%   BMI 27.97 kg/m  SpO2: SpO2: 96 % O2 Device: O2 Device: Nasal Cannula O2  Flow Rate: O2 Flow Rate (L/min): 2 L/min  Intake/output summary:  Intake/Output Summary (Last 24 hours) at 04/02/16 0953 Last data filed at 04/02/16 [id number]  Gross per 24 hour  Intake           1472.5 ml  Output              511 ml  Net            961.5 ml   LBM: Last BM Date: 04/01/16 Baseline Weight: Weight: 86.6 kg (191 lb) Most recent weight: Weight: 85.9 kg (189 lb 6 oz)       Palliative Assessment/Data:    Flowsheet Rows   Flowsheet Row Most Recent Value  Intake Tab  Referral Department  Hospitalist  Unit at Time of Referral  Other (Comment)  Palliative Care Primary Diagnosis  Cancer  Date Notified  03/30/16  Palliative Care Type  New Palliative care  Reason for referral  Clarify Goals of Care, Counsel Regarding Hospice  Date of Admission  03/25/16  Date first seen by Palliative Care  03/30/16  # of days Palliative referral response time  0 Day(s)  # of days IP prior to Palliative referral  5  Clinical Assessment  Palliative Performance Scale Score  30%  Psychosocial & Spiritual Assessment  Palliative Care Outcomes  Patient/Family meeting held?  Yes  Who was at the meeting?  [name] and 1 other dtr, 2 grandsons  Palliative Care Outcomes  Improved pain interventions, Other (Comment)      Patient Active Problem List   Diagnosis Date Noted  . PE (pulmonary thromboembolism) ([place])   . Goals of care, counseling/discussion   . Acute on chronic alteration in mental status   . Palliative care encounter   . DNR (do not resuscitate) discussion   . Hyperglycemia 03/25/2016  . Brain lesion 03/25/2016  . AKI (acute kidney injury) ([place]) 03/25/2016  . Hyperkalemia  03/25/2016  . Hydronephrosis 03/25/2016  . Dehydration   . Hydronephrosis due to obstruction of ureter   . Elevated troponin   . Seizure ([place])   . Abnormal brain CT 03/03/2016  . Hypokalemia   . Seizures ([place]) 02/29/2016  . Syncope 02/28/2016  . Port catheter in place 02/21/2016  . Neutropenic fever ([place])   . Malnutrition of moderate degree 08/28/2015  . Diet-controlled diabetes mellitus ([place])   . Coronary artery disease 08/27/2015  . Thrombocytopenia ([place]) 08/27/2015  . Hematuria 08/27/2015  . Pancytopenia ([place]) 08/27/2015  . Sepsis ([place])   . 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 Chest pain 07/17/2014  . Aortic stenosis 04/29/2013  . Thrombocytosis ([place]) 03/30/2013  . Abnormal urine cytology 05/02/2012  . Acute pulmonary edema ([place]) 10/04/2011  . Alcohol withdrawal delirium ([place]) 10/04/2011  . TIA (transient ischemic attack) 10/03/2011  . Subdural hematoma ([place]) 10/02/2011  . ETOH abuse 10/02/2011  . Permanent atrial fibrillation ([place]) 10/02/2011  . UTI (lower urinary tract infection) 10/02/2011  . Elevated PSA 05/19/2011  . Pacemaker 05/19/2011  . Diabetes mellitus ([place]) 05/19/2011  . Hypertension 05/19/2011  . 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 He was recently hospitalized from 9/8-9/13 for new onset-seizures and found to have a left occipital lobe  mass and a right frontal lobe mass on CT head. He is unable to have MRI due to his pacemaker leads.  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  He developed chest pain and was found to have bilateral multiple PEs.   He is not a candidate for anticoagulation due to SDH and thrombocytopenia.  An IVC filter was placed on 10/10.  On 10/11 that patient appeared in generalized pain/distress evaluation is still in process but blood cultures were positive for staph bacteremia.  ID is on board and vancomycin was started.  The next step with regard to the brain mets is a one time SRS procedure thru radiation oncology that will hopefully eradicate the brain mets, however the primary cancer will still likely take his life at some point.  Assessment: 82 yo male, right ureteral cancer, now with brain mets (abscesses?), multiple PEs (IVC placed as he can not be anticoagulated), new MRSA staph bacteremia.   ID recommending removing all devices to clear the infection - this would include his port-a-cath and his pace maker.  Patient admits to me that he is tired.  He has had enough and would not want to be resuscitated. However when his daughter is present and asks "do you want to give up?"  He reports that he wants resuscitation.  This gentleman is very sick.  Difficult situation.  Patient in pain.  Daughter resists morphine - stating it makes him "mean".  Tramadol can lower seizure threshold.  Trying to avoid narcotics at this point as he is receiving full scope treatment and we do not want to worsen encephalopathy.  Recommendations/Plan: Palliative Medicine will check the chart regularly and follow at a distance.  I do believe this gentleman is very sick with no easy options for recovery.   We will watch for opportunities to re-engage.    Goals of Care and Additional Recommendations:  Limitations on Scope of Treatment: Full Scope Treatment  Code Status:  Full code  Prognosis:   Unable to determine.  Likely weeks.  Discharge Planning:  To Be Determined  Care plan was discussed with patients daughter, Case Mgmt and ID Attending  Thank you for allowing the Palliative Medicine Team to assist in the care of this  patient.  We will follow peripherally and watch for opportunities to re-engage.  Please call the PM Team at the number below if we can be of assistance.   Time In: 2:00 Time Out: 2:25 Total Time 25 Prolonged Time Billed no      Greater than 50%  of this time was spent counseling and coordinating care related to the above assessment and plan.  Imogene Burn, PA-C Palliative Medicine   Please contact Palliative MedicineTeam phone at [phone] for questions and concerns.   Please see AMION for individual provider pager numbers.

## 2016-04-02 NOTE — Progress Notes (Signed)
Inpatient Diabetes Program Recommendations  AACE/ADA: New Consensus Statement on Inpatient Glycemic Control (2015)  Target Ranges:  Prepandial:   less than 140 mg/dL      Peak postprandial:   less than 180 mg/dL (1-2 hours)      Critically ill patients:  140 - 180 mg/dL   Lab Results  Component Value Date   GLUCAP 225 (H) 04/02/2016   HGBA1C 7.9 (H) 03/25/2016    Review of Glycemic Control   Inpatient Diabetes Program Recommendations:   Glucose trend increasing into the 200's. Patient was having hypoglycemia on Lantus 10 units now on Lantus 5 units. Please consider increasing Lantus to 8 units.   Thanks,  Tama Headings RN, MSN, [facility] Inpatient Diabetes Coordinator Team Pager [phone] (8a-5p)

## 2016-04-02 NOTE — Consult Note (Signed)
ELECTROPHYSIOLOGY CONSULT NOTE    Patient ID: [name] MRN: [id number], DOB/AGE: 80/26/1931 80 y.o.  Admit date: 03/25/2016 Date of Consult: [name] is a 82 y.o. male   Primary Physician: [name], MD Primary Cardiologist: Der. Croitoru Requesting MD: Dr. Tommy Medal   Reason for Consultation: PPM in place, staph aureous bacteremia  HPI: [name] is a 82 y.o. male with PMHx of permanent Afib, PPM originally implanted 1990, VHD with mild-mod AS, DM, HTN, TIA, not oon a/c secondary to prior SDH.  He last saw Dr. Loletha Grayer in feb of this year, at that time noting a diagnosis of transitional cell carcinoma, now involving the ureteropelvic junction with spread to lymph nodes in the abdomen and pelvis and infrequent and brief episodes of hematuria, and given this, not felt even more so to be an a/c candidate, though from a device/cardiac standpoint stable.  The patient admitted to [facility] 03/25/16 after just recently being discharged from [facility] 9 days previous for new seizures and possible brain METS. At that time he was feeling near his baseline. However, he quickly deteriorated developing generalized weakness which is been constant and progressive. On day of admission patient was ambulating in the bathroom when he lost all of his strength was unable to get up.  Here he has been found with MRSA bacteremia, sepsis, Multifocal nodular lung opacities on CT chest, septic emboli, and discussion if brain lesions are septic.  He is thrombocytopenic with platelets today 33.  Appreciate attending well explained note of recent events: Patient was discharged from [place] on 9/13. Then readmitted with weakness and failure to thrive, noted to have AKI, creatinine of 1.9 from baseline of 1, NA was 126, K was 6.2 CT head 03/31/2016 shows necrotic right frontal mass 2 cm, left occipital mass 12 mm. Neurology, Rad Onc and Oncology following, unable to have MRI. Discussed again on tumor board  10/9 Palliative consulted 10/9: pleuritic chest pain-CTA chest with acute on chronic PE-not anticoagulation candidate 10/10: IVC filter 10/11 developing acute encephalopathy, workup shows MRSA sepsis  EP is being asked by ID in regards to his bacteremia and PPM implant if he is felt to be a candidate for extraction of his device. VSS currently, low grade temp 100 yesterday AM.  The patient is awake, not certain why he is in the hospital, reports fainting at home.  Denies any active complaintsof pain or SOB   DEVICE HISTORY: BSCi single lead PPM, implanted 09/26/88, a new RV lead (with the original capped and abandoned) and gen change 2015, Dr. Sallyanne Kuster.  Past Medical History:  Diagnosis Date  . Allergy   . Arthritis    degenerative-knees  . Atrial fibrillation ([place]) 1990  . Bladder cancer ([place]) 2010  . Cancer ([place]) 05/05/07-06/15/07   parotid gland/66.4 GY  . Cancer ([place])    left eyelid   . Chronic kidney disease    positive cytology  . Coronary artery disease   . Diabetes mellitus without complication ([place])    meds d/c  2-3 year ago  . Dry eyes   . Dyslipidemia   . Dysrhythmia    HX A FIB  . GERD (gastroesophageal reflux disease)   . Heart murmur    2/6 systolic  . History of chemotherapy     Hx carboplatin/44fu  . History of radiation therapy 05/05/07-06/15/07   right parotid through subclavicular region/ mid jugularlymph node chain  . History of radiation therapy 05/05/07-06/15/07   Right parotid/hemineck,supraclavicular  .  HOH (hard of hearing)   . Hypertension    labile  . Pacemaker    2003 vent paced , rate 69  . Pneumonia    hx of   . Prostate hypertrophy   . Right groin hernia   . Skin cancer    HX BASAL CELL REMOVED  . Stroke [facility]) 2003   TIA  . Tinnitus    right   . Urinary tract infection    hx of   . Xerostomia    limited     Surgical History:  Past Surgical History:  Procedure Laterality Date  . BACK SURGERY    . CARDIAC CATHETERIZATION   09/20/2012   EF 50-55%, moderately calcified aortic valve leaflets, mild-moderate aortic valve stenosis, LA-appendage moderately dilated, moderate regurg of the mitral valve, RA moderately dilated  . CARDIOVASCULAR STRESS TEST  09/05/2009   Pharmalogical stress test w/o chest pain or EKG changes for ischemia  . CRANIOTOMY  12/14/2011   Procedure: CRANIOTOMY HEMATOMA EVACUATION SUBDURAL;  Surgeon: Ophelia Charter, MD;  Location: [place] [facility];  Service: Neurosurgery;  Laterality: Left;  LEFT Craniotomy for subdural   . CYSTOSCOPY  01/2011   neg  . CYSTOSCOPY W/ RETROGRADES  04/01/2012   Procedure: CYSTOSCOPY WITH RETROGRADE PYELOGRAM;  Surgeon: Claybon Jabs, MD;  Location: [facility];  Service: Urology;  Laterality: Bilateral;  . CYSTOSCOPY WITH BIOPSY  04/01/2012   Procedure: CYSTOSCOPY WITH BIOPSY;  Surgeon: Claybon Jabs, MD;  Location: [facility];  Service: Urology;  Laterality: N/A;  BLADDER BIOPSIES and bladder washings  . CYSTOSCOPY WITH BIOPSY  05/02/2012   Procedure: CYSTOSCOPY WITH BIOPSY;  Surgeon: Claybon Jabs, MD;  Location[facility];  Service: Urology;  Laterality: N/A;  . CYSTOSCOPY WITH STENT PLACEMENT  05/02/2012   Procedure: CYSTOSCOPY WITH STENT PLACEMENT;  Surgeon: Claybon Jabs, MD;  Location: [facility];  Service: Urology;  Laterality: Bilateral;  . CYSTOSCOPY WITH URETEROSCOPY AND STENT PLACEMENT Right 02/15/2015   Procedure: CYSTOSCOPY WITH RIGHT URETEROSCOPY, RENAL PELVIC WASHINGS, STENT, RETROGRADE;  Surgeon: Kathie Rhodes, MD;  Location: [facility];  Service: Urology;  Laterality: Right;  . CYSTOSCOPY/RETROGRADE/URETEROSCOPY  05/02/2012   Procedure: CYSTOSCOPY/RETROGRADE/URETEROSCOPY;  Surgeon: Claybon Jabs, MD;  Location: [facility];  Service: Urology;  Laterality: Bilateral;  CYSTOSCOPY BILATERAL RETROGRADE PYLOGRAM BILATERAL URETEROSCOPY AND BIOPSY    . CYSTOSCOPY/RETROGRADE/URETEROSCOPY  Bilateral 01/21/2015   Procedure: CYSTOSCOPY/RETROGRADE/ BLADDER BIOPSY;  Surgeon: Kathie Rhodes, MD;  Location: [facility];  Service: Urology;  Laterality: Bilateral;  . IR GENERIC HISTORICAL  03/31/2016   IR IVC FILTER PLMT / S&I Burke Keels GUID/MOD SED 03/31/2016 Arne Cleveland, MD [facility]  . IR GENERIC HISTORICAL  03/31/2016   IR FLUORO GUIDE CV LINE RIGHT 03/31/2016 Arne Cleveland, MD [facility]  . LOWER EXTREMITY ARTERIAL DOPPLER  09/28/2011   No evidence of arterial insufficiency.   . [place]  . PACEMAKER GENERATOR CHANGE  05/23/2002   [facility] [place], [place], serial [phone]  . PACEMAKER GENERATOR CHANGE N/A 09/01/2013   Procedure: PACEMAKER GENERATOR CHANGE;  Surgeon: Sanda Klein, MD;  Location: [place] [facility];  Service: Cardiovascular;  Laterality: N/A;  . [place]   last changed 2003  . PAROTIDECTOMY     right  . URETEROSCOPY  04/01/2012   Procedure: URETEROSCOPY;  Surgeon: Claybon Jabs, MD;  Location: [facility];  Service: Urology;  Laterality: Right;  Prescriptions Prior to Admission  Medication Sig Dispense Refill Last Dose  . acetaminophen (TYLENOL) 500 MG tablet Take 500 mg by mouth daily as needed for mild pain.   03/24/2016 at Unknown time  . ALPRAZolam (XANAX) 0.5 MG tablet Take 0.5 mg by mouth at bedtime as needed for sleep.    Past Week at Unknown time  . amLODipine (NORVASC) 10 MG tablet Take 1 tablet (10 mg total) by mouth daily. 30 tablet 0 03/24/2016 at Unknown time  . clopidogrel (PLAVIX) 75 MG tablet Take 1 tablet (75 mg total) by mouth daily. For Afib/TIA--restart 03/07/16 30 tablet 0 03/24/2016 at Unknown time  . dexamethasone (DECADRON) 4 MG tablet Take 1 tablet (4 mg total) by mouth every 12 (twelve) hours. X 6 days, then 4 mg once daily (Patient taking differently: Take 4 mg by mouth daily. ) 36 tablet 0 03/24/2016 at Unknown time  . divalproex (DEPAKOTE) 500 MG DR tablet Take 1 tablet (500 mg  total) by mouth every 12 (twelve) hours. 60 tablet 0 03/24/2016 at Unknown time  . insulin aspart (NOVOLOG) 100 UNIT/ML injection Inject 8-10 Units into the skin 2 (two) times daily. Per sliding scale.   03/24/2016 at Unknown time  . mineral oil-hydrophilic petrolatum (AQUAPHOR) ointment Apply topically 2 (two) times daily. (Patient taking differently: Apply 1 application topically daily. ) 420 g 0 Past Week at Unknown time  . pantoprazole (PROTONIX) 40 MG tablet Take 40 mg by mouth daily. For GERD.   03/24/2016 at Unknown time  . polyethylene glycol (MIRALAX / GLYCOLAX) packet Take 17 g by mouth daily. (Patient taking differently: Take 17 g by mouth daily as needed for mild constipation. )   Past Month at Unknown time  . prochlorperazine (COMPAZINE) 10 MG tablet Take 10 mg by mouth every 6 (six) hours as needed for nausea or vomiting. Reported on 01/10/2016   Past Month at Unknown time  . oxybutynin (DITROPAN) 5 MG tablet Take 1 tablet (5 mg total) by mouth every 8 (eight) hours as needed for bladder spasms. (Patient not taking: Reported on 03/25/2016)   Not Taking at Unknown time    Inpatient Medications:  . acetaminophen  1,000 mg Oral Q8H  . dexamethasone  4 mg Oral Daily  . feeding supplement (GLUCERNA SHAKE)  237 mL Oral BID BM  . folic acid  1 mg Oral Daily  . insulin aspart  0-15 Units Subcutaneous TID WC  . insulin aspart  0-5 Units Subcutaneous QHS  . insulin glargine  5 Units Subcutaneous QHS  . levETIRAcetam  500 mg Oral BID  . magnesium sulfate 1 - 4 g bolus IVPB  2 g Intravenous Once  . mouth rinse  15 mL Mouth Rinse BID  . pantoprazole  40 mg Oral Daily  . polyethylene glycol  17 g Oral BID  . senna-docusate  2 tablet Oral QHS  . sodium chloride flush  3 mL Intravenous Q12H  . vancomycin  750 mg Intravenous Q12H  . vitamin B-12  1,000 mcg Oral Daily    Allergies:  Allergies  Allergen Reactions  . Codeine Other (See Comments)    Blisters between fingers  . Docetaxel Rash     Social History   Social History  . Marital status: Widowed    Spouse name: N/A  . Number of children: 2  . Years of education: 12   Occupational History  . retired    Social History Main Topics  . Smoking status: Never Smoker  .  Smokeless tobacco: Never Used  . Alcohol use 10.5 oz/week    21 Standard drinks or equivalent per week     Comment: 3 /day( bourbon)  . Drug use: No  . Sexual activity: No   Other Topics Concern  . Not on file   Social History Narrative   Lives at home. Got released from [facility] yesterday (03/17/16)   Caffeine use:      Family History  Problem Relation Age of Onset  . Heart disease Mother   . Cancer Father     Lung cancer  . Emphysema Brother   . Coronary artery disease Son      Review of Systems: difficult to established with some AMS  Physical Exam: Vitals:   04/01/16 2149 04/02/16 0007 04/02/16 0615 04/02/16 1404  BP: 110/69 108/62 97/75 106/62  Pulse: 95 (!) 103 (!) 101 61  Resp: 18 (!) 22 18 18   Temp: 98.4 F (36.9 C)  98.5 F (36.9 C) 99.5 F (37.5 C)  TempSrc: Oral  Oral Oral  SpO2: (!) 88% 100% 96% 96%  Weight:      Height:        GEN- The patient appears ill and frail, alert and oriented to self reliably only.   HEENT: normocephalic, atraumatic; sclera clear, conjunctiva pink; hearing intact; oropharynx clear; neck supple, no JVP Lymph- no cervical lymphadenopathy Lungs- Clear to ausculation bilaterally, normal work of breathing.  No wheezes, rales, rhonchi Heart- Regular rate and rhythm, 1/6 SM, rubs or gallops, PMI not laterally displaced GI- soft, non-tender, non-distended, bowel sounds present Extremities- no clubbing, cyanosis, or edema; DP/PT/radial pulses 2+ bilaterally MS- no significant deformity, age appropriate atrophy Skin- warm and dry, no rash or lesion Psych- euthymic mood, full affect Neuro- no gross deficits observed  Labs:   Lab Results  Component Value Date   WBC 6.1 04/02/2016    HGB 12.7 (L) 04/02/2016   HCT 35.3 (L) 04/02/2016   MCV 101.4 (H) 04/02/2016   PLT 33 (L) 04/02/2016    Recent Labs Lab 04/02/16 0322 04/02/16 0720  NA 128* 130*  K 5.0  --   CL 91*  --   CO2 29  --   BUN 27*  --   CREATININE 1.15  --   CALCIUM 8.3*  --   PROT 4.9*  --   BILITOT 0.7  --   ALKPHOS 89  --   ALT 17  --   AST 18  --   GLUCOSE 197*  --        EKG: V paced TELEMETRY: Afib intermittent V pacing throughout    Assessment and Plan:         MRSA bacteremia, sepsis     Lung opacities, emboli, DVT     transitional cell carcinoma with either METS or septic emboli to brain     Thrombocytopenia     Weakness failure to thrive     AMS, recent new  Onset of seizures     Electrolyte imbalance  At this time, the patient is a better candidate for long term antibiotic therapy, not currently a candidate for pacemaker system extraction.   Venetia Night, PA-C 04/02/2016 4:19 PM   I have seen and examined this patient with Tommye Standard.  Agree with above, note added to reflect my findings.  On exam, iRRR, no murmurs, lungs clear. Admittd with MRSA bacteremia and sepsis.  Unfortunatly, platelets are low and with brain lesions, lung opacities, transitional cell carcinoma,  unlikely to benefit from device extraction at this time.  Agree with suppressive antibiotics.   Aubryn Spinola M. Samaiyah Howes MD 04/02/2016 8:41 PM

## 2016-04-02 NOTE — Progress Notes (Signed)
Dr. Posey Pronto notified our service this morning that the patient has MRSA bacteremia. Dr. Tammi Klippel has recommended holding off on plans to proceed with simulation with SRS until the patient is stabilized. We will continue to follow along.  Carola Rhine, PAC

## 2016-04-02 NOTE — Progress Notes (Signed)
Events in the last 48 hours noted. Acute mental status changes and start bacteremia has been detected. He is currently receiving intravenous antibiotics and possible removal of PICC line and Port-A-Cath is contemplated. He did develop thrombocytopenia as well likely related to sepsis.  I have no objections of Port-A-Cath removal and his risk of bleeding is minimal at this time. I recommend consulting interventional radiology for Port-A-Cath removal.  His thrombocytopenia. To be reactive likely related to sepsis rather than HIT or DIC. I recommend continue supportive management as you are doing.  His overall prognosis continues to be poor given the multitude of issues he is developing at this time. I agree with holding off on treatment of his presumed brain metastasis given his unstable clinical status.  We will continue to follow while he is hospitalized.

## 2016-04-02 NOTE — Procedures (Signed)
HPI:  82 y/o with intracranial mets and MS change  TECHNICAL SUMMARY:  A multichannel referential and bipolar montage EEG using the standard international 10-20 system was performed on the patient described as lethargic.  The dominant background activity consists of a disorganized, low voltage, maximum of 6 hertz activity seen most prominantly over the posterior head region.  Much overlying 1-1/2-3 Hz activity can be seen in all head regions.  The backgound activity is reactive to eye opening and closing procedures 1 performed by the technologist.    ACTIVATION:  Stepwise photic stimulation and hyperventilation are not performed  EPILEPTIFORM ACTIVITY:  There were no spikes, sharp waves or paroxysmal activity.  SLEEP:  No definitive evidence of sleep  CARDIAC:  The EKG lead revealed an irregular rhythm.    IMPRESSION:  This is an abnormal EEG demonstrating a moderate to moderately severe diffuse slowing of electrocerebral activity.  This can be seen in a wide variety of encephalopathic state including those of a toxic, metabolic, or degenerative nature.  There were no focal, hemispheric, or lateralizing features.  No epileptiform activity was recorded.

## 2016-04-02 NOTE — Progress Notes (Signed)
Patient was seen and examined this afternoon. He was lethargic and minimally responsive.   No bleeding noted around IV lines or urine catheter.   Labs reviewed again and his thrombocytopenia appear to be related to acute illness and sepsis. I see no objection to transfusion if he develops bleeding. I see no need for it at this point.   I agree with current management and will continue to check on him periodically.

## 2016-04-02 NOTE — Consult Note (Signed)
Chief Complaint: Patient was seen in consultation today for port a cath and Rt IJ central catheter removal Chief Complaint  Patient presents with  . Weakness   at the request of Dr Marlowe Sax  Referring Physician(s): Dr Marlowe Sax  Supervising Physician: Arne Cleveland  Patient Status: [facility] - In-pt  History of Present Illness: [name] is a 82 y.o. male   Urothelial Ca- Rt ureter Has been hospitalized at least twice in last few months for Sz disorder; Brain metastasis Deconditioning New admission 10/4 for AMS; sepsis + Bacteremia---MRSA  PAC placed in IR 07/2015 Rt IJ central catheter placed in IR 03/31/16  Thrombocytopenia; Plt 33 Secondary sepsis   Request top remove lines per Dr Posey Pronto Discussed with Dr Valentina Shaggy procedure  Palliative consulted  Past Medical History:  Diagnosis Date  . Allergy   . Arthritis    degenerative-knees  . Atrial fibrillation ([place]) 1990  . Bladder cancer ([place]) 2010  . Cancer ([place]) 05/05/07-06/15/07   parotid gland/66.4 GY  . Cancer ([place])    left eyelid   . Chronic kidney disease    positive cytology  . Coronary artery disease   . Diabetes mellitus without complication ([place])    meds d/c  2-3 year ago  . Dry eyes   . Dyslipidemia   . Dysrhythmia    HX A FIB  . GERD (gastroesophageal reflux disease)   . Heart murmur    2/6 systolic  . History of chemotherapy     Hx carboplatin/72fu  . History of radiation therapy 05/05/07-06/15/07   right parotid through subclavicular region/ mid jugularlymph node chain  . History of radiation therapy 05/05/07-06/15/07   Right parotid/hemineck,supraclavicular  . HOH (hard of hearing)   . Hypertension    labile  . Pacemaker    2003 vent paced , rate 69  . Pneumonia    hx of   . Prostate hypertrophy   . Right groin hernia   . Skin cancer    HX BASAL CELL REMOVED  . Stroke [facility]) 2003   TIA  . Tinnitus    right   . Urinary tract infection    hx of   . Xerostomia    limited    Past Surgical History:  Procedure Laterality Date  . BACK SURGERY    . CARDIAC CATHETERIZATION  09/20/2012   EF 50-55%, moderately calcified aortic valve leaflets, mild-moderate aortic valve stenosis, LA-appendage moderately dilated, moderate regurg of the mitral valve, RA moderately dilated  . CARDIOVASCULAR STRESS TEST  09/05/2009   Pharmalogical stress test w/o chest pain or EKG changes for ischemia  . CRANIOTOMY  12/14/2011   Procedure: CRANIOTOMY HEMATOMA EVACUATION SUBDURAL;  Surgeon: Ophelia Charter, MD;  Location: [place] NEURO ORS;  Service: Neurosurgery;  Laterality: Left;  LEFT Craniotomy for subdural   . CYSTOSCOPY  01/2011   neg  . CYSTOSCOPY W/ RETROGRADES  04/01/2012   Procedure: CYSTOSCOPY WITH RETROGRADE PYELOGRAM;  Surgeon: Claybon Jabs, MD;  Location: [facility];  Service: Urology;  Laterality: Bilateral;  . CYSTOSCOPY WITH BIOPSY  04/01/2012   Procedure: CYSTOSCOPY WITH BIOPSY;  Surgeon: Claybon Jabs, MD;  Location: [facility];  Service: Urology;  Laterality: N/A;  BLADDER BIOPSIES and bladder washings  . CYSTOSCOPY WITH BIOPSY  05/02/2012   Procedure: CYSTOSCOPY WITH BIOPSY;  Surgeon: Claybon Jabs, MD;  Location: [facility];  Service: Urology;  Laterality: N/A;  . CYSTOSCOPY WITH STENT PLACEMENT  05/02/2012   Procedure: CYSTOSCOPY WITH STENT PLACEMENT;  Surgeon: Claybon Jabs, MD;  Location: [facility];  Service: Urology;  Laterality: Bilateral;  . CYSTOSCOPY WITH URETEROSCOPY AND STENT PLACEMENT Right 02/15/2015   Procedure: CYSTOSCOPY WITH RIGHT URETEROSCOPY, RENAL PELVIC WASHINGS, STENT, RETROGRADE;  Surgeon: Kathie Rhodes, MD;  Location: [facility];  Service: Urology;  Laterality: Right;  . CYSTOSCOPY/RETROGRADE/URETEROSCOPY  05/02/2012   Procedure: CYSTOSCOPY/RETROGRADE/URETEROSCOPY;  Surgeon: Claybon Jabs, MD;  Location: [facility];  Service: Urology;  Laterality: Bilateral;   CYSTOSCOPY BILATERAL RETROGRADE PYLOGRAM BILATERAL URETEROSCOPY AND BIOPSY    . CYSTOSCOPY/RETROGRADE/URETEROSCOPY Bilateral 01/21/2015   Procedure: CYSTOSCOPY/RETROGRADE/ BLADDER BIOPSY;  Surgeon: Kathie Rhodes, MD;  Location: [facility];  Service: Urology;  Laterality: Bilateral;  . IR GENERIC HISTORICAL  03/31/2016   IR IVC FILTER PLMT / S&I Burke Keels GUID/MOD SED 03/31/2016 Arne Cleveland, MD [facility]-INTERV RAD  . IR GENERIC HISTORICAL  03/31/2016   IR FLUORO GUIDE CV LINE RIGHT 03/31/2016 Arne Cleveland, MD [facility]-INTER[facility]  . LOWER EXTREMITY ARTERIAL DOPPLER  09/28/2011   No evidence of arterial insufficiency.   . [place]  . PACEMAKER GENERATOR CHANGE  05/23/2002   [facility] [place], [place], serial [phone]  . PACEMAKER GENERATOR CHANGE N/A 09/01/2013   Procedure: PACEMAKER GENERATOR CHANGE;  Surgeon: Sanda Klein, MD;  Location: [place] CATH LAB;  Service: Cardiovascular;  Laterality: N/A;  . [place]   last changed 2003  . PAROTIDECTOMY     right  . URETEROSCOPY  04/01/2012   Procedure: URETEROSCOPY;  Surgeon: Claybon Jabs, MD;  Location: [facility];  Service: Urology;  Laterality: Right;    Allergies: Codeine and Docetaxel  Medications: Prior to Admission medications   Medication Sig Start Date End Date Taking? Authorizing Provider  acetaminophen (TYLENOL) 500 MG tablet Take 500 mg by mouth daily as needed for mild pain.   Yes Historical Provider, MD  ALPRAZolam Duanne Moron) 0.5 MG tablet Take 0.5 mg by mouth at bedtime as needed for sleep.  03/16/16  Yes Historical Provider, MD  amLODipine (NORVASC) 10 MG tablet Take 1 tablet (10 mg total) by mouth daily. 03/05/16  Yes Orson Eva, MD  clopidogrel (PLAVIX) 75 MG tablet Take 1 tablet (75 mg total) by mouth daily. For Afib/TIA--restart 03/07/16 03/07/16  Yes David Tat, MD  dexamethasone (DECADRON) 4 MG tablet Take 1 tablet (4 mg total) by mouth every 12 (twelve) hours. X 6 days, then 4 mg once  daily Patient taking differently: Take 4 mg by mouth daily.  03/04/16  Yes Orson Eva, MD  divalproex (DEPAKOTE) 500 MG DR tablet Take 1 tablet (500 mg total) by mouth every 12 (twelve) hours. 03/04/16  Yes Orson Eva, MD  insulin aspart (NOVOLOG) 100 UNIT/ML injection Inject 8-10 Units into the skin 2 (two) times daily. Per sliding scale.   Yes Historical Provider, MD  mineral oil-hydrophilic petrolatum (AQUAPHOR) ointment Apply topically 2 (two) times daily. Patient taking differently: Apply 1 application topically daily.  09/09/15  Yes Dinah C Ngetich, NP  pantoprazole (PROTONIX) 40 MG tablet Take 40 mg by mouth daily. For GERD.   Yes Historical Provider, MD  polyethylene glycol (MIRALAX / GLYCOLAX) packet Take 17 g by mouth daily. Patient taking differently: Take 17 g by mouth daily as needed for mild constipation.  09/03/15  Yes Barton Dubois, MD  prochlorperazine (COMPAZINE) 10 MG tablet Take 10 mg by mouth every 6 (six) hours as needed for nausea or vomiting. Reported  on 01/10/2016   Yes Historical Provider, MD  oxybutynin (DITROPAN) 5 MG tablet Take 1 tablet (5 mg total) by mouth every 8 (eight) hours as needed for bladder spasms. Patient not taking: Reported on 03/25/2016 09/03/15   Barton Dubois, MD     Family History  Problem Relation Age of Onset  . Heart disease Mother   . Cancer Father     Lung cancer  . Emphysema Brother   . Coronary artery disease Son     Social History   Social History  . Marital status: Widowed    Spouse name: N/A  . Number of children: 2  . Years of education: 12   Occupational History  . retired    Social History Main Topics  . Smoking status: Never Smoker  . Smokeless tobacco: Never Used  . Alcohol use 10.5 oz/week    21 Standard drinks or equivalent per week     Comment: 3 /day( bourbon)  . Drug use: No  . Sexual activity: No   Other Topics Concern  . None   Social History Narrative   Lives at home. Got released from [place] home  yesterday (03/17/16)   Caffeine use:      Review of Systems: A 12 point ROS discussed and pertinent positives are indicated in the HPI above.  All other systems are negative.  Review of Systems  Constitutional: Positive for activity change and appetite change. Negative for fever.  Neurological: Positive for weakness.  Psychiatric/Behavioral: Positive for confusion. Negative for behavioral problems.    Vital Signs: BP 97/75 (BP Location: Right Arm)   Pulse (!) 101   Temp 98.5 F (36.9 C) (Oral)   Resp 18   Ht 5\' 9"  (1.753 m)   Wt 189 lb 6 oz (85.9 kg)   SpO2 96%   BMI 27.97 kg/m   Physical Exam  Cardiovascular: Normal rate.   Pulmonary/Chest: Effort normal.  Abdominal: Soft.  Musculoskeletal:  Unable to follow commands  Neurological:  No response verbally Does try to open eyes to my voice Opens mouth to command  Skin: Skin is warm.  Psychiatric:  Consented with Dtr via phone  Nursing note and vitals reviewed.   Mallampati Score:  MD Evaluation Airway: WNL Heart: WNL Abdomen: WNL Chest/ Lungs: WNL ASA  Classification: 3 Mallampati/Airway Score: Two  Imaging: Ct Head Wo Contrast  Result Date: 04/01/2016 CLINICAL DATA:  Acute on chronic altered mental status. EXAM: CT HEAD WITHOUT CONTRAST TECHNIQUE: Contiguous axial images were obtained from the base of the skull through the vertex without intravenous contrast. COMPARISON:  Yesterday FINDINGS: Brain: Motion degraded, best obtainable in the circumstances. Isodense parasagittal right frontal brain mass and hyperdense left occipital mass are stable without contrast. No acute hemorrhage, hydrocephalus, or evidence of acute infarct. 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 Vascular: Atherosclerotic calcification. Skull: No acute finding.  Previous left craniotomy. Sinuses/Orbits: Right otomastoiditis, similar to previous. No coalescing features. Clear nasopharynx. Mild  sinusitis. IMPRESSION: 1. Stable motion degraded exam.  No acute intracranial finding. 2. Known right frontal and left occipital masses. 3. Chronic right otomastoiditis. Electronically Signed   By: Monte Fantasia M.D.   On: 04/01/2016 18:44   Ct Head W & Wo Contrast  Result Date: 03/31/2016 CLINICAL DATA:  Followup brain lesion. Acute presentation with weakness. EXAM: CT HEAD WITHOUT AND WITH CONTRAST TECHNIQUE: Contiguous axial images were obtained from the base of the skull through the vertex without and with  intravenous contrast CONTRAST:  98mL ISOVUE-300 IOPAMIDOL (ISOVUE-300) INJECTION 61%<Contrast>72mL ISOVUE-300 IOPAMIDOL (ISOVUE-300) INJECTION 61% COMPARISON:  03/25/2016 an multiple previous FINDINGS: Brain: 2 brain lesions are identified. No abnormality seen affecting the brainstem or cerebellum. Within the left posterior temporal/occipital region, there is an enhancing mass measuring 12 mm cephalo caudal with a transverse diameter of 8 mm consistent with a metastasis. Within the right medial frontal lobe, there is a 2.1 cm by 2.2 x 1.8 mass with areas of cystic degeneration. These are consistent with metastatic lesions. No other lesion is identified by CT. Punctate focus of cortical calcification in the right parietal region is unchanged. Old left frontal cortical and subcortical infarction is unchanged. Chronic small-vessel ischemic changes affect the white matter. No hydrocephalus. No extra-axial collection. Vascular: No primary vascular lesion. Major vessels show flow. There is atherosclerotic calcification of the major vessels at the base of the brain. Skull: Previous left frontoparietal craniotomy. Sinuses/Orbits: Clear Other: None significant IMPRESSION: Two brain masses identified consist with metastatic disease. Partially necrotic right frontal mass measuring 2.1 x 2.2 x 1.8 cm. Left posterior temporal/ occipital mass measuring 12 x 8 x 8 mm. Electronically Signed   By: Nelson Chimes M.D.    On: 03/31/2016 10:13   Ct Head W & Wo Contrast  Result Date: 03/25/2016 CLINICAL DATA:  82 y/o  M; weakness. EXAM: CT HEAD WITHOUT AND WITH CONTRAST TECHNIQUE: Contiguous axial images were obtained from the base of the skull through the vertex without and with intravenous contrast CONTRAST:  46mL ISOVUE-300 IOPAMIDOL (ISOVUE-300) INJECTION 61%<Contrast>39mL ISOVUE-300 IOPAMIDOL (ISOVUE-300) INJECTION 61% COMPARISON:  CT of head with contrast dated 02/28/2016 FINDINGS: Brain: Stable left inferior frontal encephalomalacia. Stable hyperdense focus in the left occipital lobe measuring 10 mm (series 2, image 14). Postcontrast administration the lesion appears to enhance only on the coronal reconstruction (compare series 8, image 33 with series 6, image 54). Additionally, there is a probable new focus of enhancement in the right paramedian frontal lobe (series 4, image 15) No evidence for large territory infarct or new intracranial hemorrhage. Stable mild to moderate chronic microvascular ischemic changes and parenchymal volume loss. Vascular: Calcific atherosclerosis of carotid siphons. Skull: Postsurgical changes related to a left frontal craniotomy are stable. All Sinuses/Orbits: Right mastoid opacification with sclerosis compatible with sequelae of chronic otomastoiditis, stable. Mild paranasal sinus disease with sphenoid and ethmoid patchy opacification. Left mastoid air cells are clear. Orbits are unremarkable. Other: Right parotidectomy. IMPRESSION: 1. No evidence of large territory infarct or new intracranial hemorrhage. 2. Probable new focus of enhancement within the right paramedian frontal lobe. Differential includes metastatic disease or subacute infarction. 3. Previous identified by right frontal parafalcine hyperdense focus is no longer appreciated, possibly having represented an area of hemorrhage. 4. Left occipital hyperdense focus with uncertain enhancement. Stable in size in comparison with prior  studies. Electronically Signed   By: Kristine Garbe M.D.   On: 03/25/2016 17:29   Ct Angio Chest Pe W Or Wo Contrast  Result Date: 03/30/2016 CLINICAL DATA:  Chest pain and shortness of breath. EXAM: CT ANGIOGRAPHY CHEST WITH CONTRAST TECHNIQUE: Multidetector CT imaging of the chest was performed using the standard protocol during bolus administration of intravenous contrast. Multiplanar CT image reconstructions and MIPs were obtained to evaluate the vascular anatomy. CONTRAST:  100 cc Isovue 370 intravenous COMPARISON:  03/01/2016 FINDINGS: Cardiovascular: Study is optimized for the pulmonary arteries and is good quality. There are multiple pulmonary artery filling defects bilaterally, including subsegmental branches in the left lower  lobe (6:112 and 134) and in the right middle and lower lobes. Some of these emboli appear acute, especially in the right lower lobe anterior basilar segment 6:149, but elsewhere there are subacute to chronic features, especially proximal segmental branches of the right lower lobe 6:126. Normal RV to LV ratio of 60%. [place] in the right ventricle are prominent, without convincing superimposed thrombus. Cardiomegaly. No pericardial effusion. Single chamber pacer into the right ventricular apex. Atherosclerotic calcification, including the coronary arteries. Mild aortic valve calcification. Mediastinum/Nodes: Negative for adenopathy. Lungs/Pleura: New apical predominant nodular opacities with indistinct appearance. Dominant nodule in the left upper lobe, 5:15, measuring up to 12 mm. Chronic microcalcification or aspirated barium in the dependent right lower lobe Upper Abdomen: Cholelithiasis.  No acute finding. Musculoskeletal: T5 vertebral body fracture that is oblique. The fracture is subacute based on previous study, new from 11/05/2015. Fracture is nondisplaced and does not appear to continue through the posterior elements. There is diffuse ankylosis of the thoracic  spine. Critical Value/emergent results were called by telephone at the time of interpretation on 03/30/2016 at 12:31 pm to Dr. Domenic Polite , who verbally acknowledged these results. Review of the MIP images confirms the above findings. IMPRESSION: 1. Multifocal pulmonary emboli ranging from acute to subacute/chronic, segmental to subsegmental size. Dilated pulmonary arterial tree suggest pulmonary hypertension. No indication of acute right heart strain. 2. Nondisplaced subacute T5 body fracture without visible continuation through the posterior elements. 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 3. New multi focal nodular lung opacities. Ill-defined appearance and development over 1 month suggests atypical, potentially hematogenous infection. Metastases are not excluded and close follow-up is recommended in this patient with urothelial cancer. Electronically Signed   By: Monte Fantasia M.D.   On: 03/30/2016 12:32   Ir Ivc Filter Plmt / S&i /img Guid/mod Sed  Result Date: 03/31/2016 CLINICAL DATA:  Acute pulmonary emboli. Metastatic urothelial carcinoma. Brain metastases, a relative contraindication to anticoagulation. Caval filtration requested. EXAM: INFERIOR VENACAVOGRAM IVC FILTER PLACEMENT UNDER FLUOROSCOPY FLUOROSCOPY TIME:  36 seconds, 34 mGy TECHNIQUE: The procedure, risks (including but not limited to bleeding, infection, organ damage ), benefits, and alternatives were explained to the patient. Questions regarding the procedure were encouraged and answered. The patient understands and consents to the procedure. Patency of the right IJ vein was confirmed with ultrasound with image documentation. An appropriate skin site was determined. Skin site was marked, prepped with chlorhexidine, and draped using maximum barrier technique. The region was infiltrated locally with 1% lidocaine. Under real-time ultrasound guidance, the right IJ vein was accessed  with a 21 gauge micropuncture needle; the needle tip within the vein was confirmed with ultrasound image documentation. The needle was exchanged over a 018 guidewire for a transitional dilator. Intravenous Fentanyl and Versed were administered as conscious sedation during continuous monitoring of the patient's level of consciousness and physiological / cardiorespiratory status by the radiology RN, with a total moderate sedation time of 10 minutes. A Benson wire was advanced through the transitional dilator into the IVC. A long 6 French vascular sheath was placed for inferior venacavography. This demonstrated no caval thrombus. Renal vein inflows were evident. The TrapEase IVC filter was advanced through the sheath and successfully deployed under fluoroscopy at the L2-3 level. Followup cavagram demonstrates stable filter position and no evident complication. The sheath was exchanged for central venous catheter at the site (dictated separately). No immediate complication. IMPRESSION: 1. Normal IVC. No thrombus or significant anatomic variation. 2. Technically successful  infrarenal IVC filter placement. Electronically Signed   By: Lucrezia Europe M.D.   On: 03/31/2016 15:36   US Renal  Result Date: 03/25/2016 CLINICAL DATA:  Hydronephrosis due to obstruction of the ureter. Patient with history of urothelial carcinoma treated with chemotherapy most recently November 27, 2015. Noted to have mild right hydroureteronephrosis to the level of the proximal right ureter on prior CT of 03/01/2016. EXAM: RENAL / URINARY TRACT ULTRASOUND COMPLETE COMPARISON:  CT 03/01/2016 FINDINGS: Right Kidney: Length: 9.7 cm. Echogenicity within normal limits. No mass or hydronephrosis visualized. Cortical - medullary distinction is maintained. There is fullness of the right renal collecting system likely representing mild hydronephrosis (images 16 and 18). A hydroureter however was not visualized. Left Kidney: Length: 10 cm. Echogenicity within  normal limits. No mass or hydronephrosis visualized. Bladder: Physiologically distended. Only a left-sided ureteral jet could be confirmed despite several minutes of imaging per discussion with ultrasound technologist. IMPRESSION: Mild right-sided hydronephrosis. Absent right ureteral jet suggests continued obstruction. Patent left ureter. Electronically Signed   By: Ashley Royalty M.D.   On: 03/25/2016 17:51   Ir Fluoro Guide Cv Line Right  Result Date: 03/31/2016 CLINICAL DATA:  Metastatic urothelial carcinoma. Poor peripheral IV access. Right IJ access was achieved for IVC filter placement. EXAM: RIGHT IJ CENTRAL VENOUS CATHETER PLACEMENT UNDER ULTRASOUND AND FLUOROSCOPIC GUIDANCE FLUOROSCOPY TIME:  Less than 6 seconds TECHNIQUE: Right IJ venous access had been obtained under sterile conditions for IVC filter placement as dictated separately. Immediately following the conclusion of the procedure, the IVC filter delivery sheath was exchanged over the [facility] wire for the micropuncture transitional dilator, through which a 018 wire was then advanced. Over this, a peel-away sheath was placed. A 5 French dual-lumen power injectable PICC catheter trimmed to appropriate length was then advanced, positioned with the tip at the cavoatrial junction. Spot chest radiograph shows good positioning and no pneumothorax. Catheter was flushed and sutured externally with 0-Prolene sutures. Patient tolerated the procedure well. COMPLICATIONS: None immediate IMPRESSION: 1. Technically successful right IJ double lumen power injectable central venous catheter placement. Electronically Signed   By: Lucrezia Europe M.D.   On: 03/31/2016 15:57   Dg Chest Port 1 View  Result Date: 04/02/2016 CLINICAL DATA:  Sepsis EXAM: PORTABLE CHEST 1 VIEW COMPARISON:  Chest radiograph February 28, 2016 and chest CT March 30, 2016 FINDINGS: There is no edema or consolidation. There is mild cardiomegaly with pulmonary vascularity within normal limits.  Port-A-Cath tip is at the cavoatrial junction. Right central catheter tip in right atrium. No pneumothorax. Pacemaker leads are attached to the right atrium and right ventricle. There is atherosclerotic calcification in the aorta. No adenopathy. IMPRESSION: Catheter positions as described without pneumothorax. No edema or consolidation. Stable cardiac prominence. There is aortic atherosclerosis. Electronically Signed   By: Lowella Grip III M.D.   On: 04/02/2016 08:53    Labs:  CBC:  Recent Labs  03/30/16 0530 04/01/16 0045 04/01/16 1736 04/02/16 0322  WBC 14.9* 10.0 8.6 6.1  HGB 14.8 14.3 13.6 12.7*  HCT 41.8 40.2 38.3* 35.3*  PLT 73* 53* 49* 33*    COAGS:  Recent Labs  08/28/15 0247 02/28/16 1630 03/31/16 0558 04/02/16 0322  INR 1.04 1.08 1.09 1.18  APTT 22*  --   --   --     BMP:  Recent Labs  03/30/16 0530 03/31/16 2343 04/01/16 1736 04/01/16 1957 04/01/16 2258 04/02/16 0322 04/02/16 0720  NA 132* 130* 127* 128* 127* 128* 130*  K  4.5 5.2* 5.5*  --   --  5.0  --   CL 92* 89* 89*  --   --  91*  --   CO2 30 32 27  --   --  29  --   GLUCOSE 118* 143* 212*  --   --  197*  --   BUN 31* 25* 29*  --   --  27*  --   CALCIUM 8.7* 8.7* 8.7*  --   --  8.3*  --   CREATININE 0.97 1.04 1.26*  --   --  1.15  --   GFRNONAA >60 >60 51*  --   --  57*  --   GFRAA >60 >60 59*  --   --  >60  --     LIVER FUNCTION TESTS:  Recent Labs  02/28/16 1630 03/17/16 1547 04/01/16 1736 04/02/16 0322  BILITOT 1.8* 0.9 0.9 0.7  AST 29 15 22 18   ALT 12* 14 18 17   ALKPHOS 113 83 96 89  PROT 7.3 6.4 5.2* 4.9*  ALBUMIN 3.4* 3.7 2.2* 1.9*    TUMOR MARKERS: No results for input(s): AFPTM, CEA, CA199, CHROMGRNA in the last 8760 hours.  Assessment and Plan:  Bacteremia +MRSA Urothelial Ca Scheduled for PAC and R IJ line removal pts dtr aware of procedure benefits and risks including but not limited to: Infection; bleeding; vessel damage; damage to surrounding  structures. Agreeable to proceed Consent signed andin chart  Thank you for this interesting consult.  I greatly enjoyed meeting [name] and look forward to participating in their care.  A copy of this report was sent to the requesting provider on this date.  Electronically Signed: Maryjane Benedict A 04/02/2016, 1:46 PM   I spent a total of 40 Minutes    in face to face in clinical consultation, greater than 50% of which was counseling/coordinating care for PAC and Rt IJ line removal

## 2016-04-02 NOTE — Progress Notes (Signed)
[facility] Progress Note  Patient: [name] [id number]   PCP: [name], MD DOB: 07-02-1929   DOA: 03/25/2016   DOS: 04/02/2016   Date of Service: the patient was seen and examined on 04/02/2016  Brief hospital course: [name] an 82 y.o.malewith H/o atrial fibrillation not on anticoagulation, pacemaker,mod AS, subdural hematoma, diet controlled diabetes, transitional cell carcinoma with intermittent hematuria, s/p chemotherapy, TIA, HTN was discharged from [facility] 9/13 to [facility] for rehab after long hospitalization for new onset seizures and possible brain metastasis noted on CT head, he was seen by Neurology, Neurosurgery and Oncology, couldn't have an MRI due to pacer. Finally plan was for FU with Dr.Shadad in Nov with plan for repeat CT with SRS protocol. Patient was discharged from [facility] on 9/13. Then readmitted with weakness and failure to thrive, noted to have AKI, creatinine of 1.9 from baseline of 1, NA was 126, K was 6.2 CT head 03/31/2016 shows necrotic right frontal mass 2 cm, left occipital mass 12 mm. Neurology, Rad Onc and Oncology following, unable to have MRI. Discussed again on tumor board 10/9 Palliative consulted 10/9: pleuritic chest pain-CTA chest with acute on chronic PE-not anticoagulation candidate 10/10: IVC filter 10/11 developing acute encephalopathy, workup shows MRSA sepsis Currently further plan is continue IV antibiotics.  Assessment and Plan: 1. MRSA bacteremia  Sepsis associated with bacteremia. Multifocal nodular lung opacities on CT chest, septic emboli  Patient presents with complaints of weakness and failure to thrive. Recently admitted for new onset seizures as well as brain lesions suspicious for metastasis. Patient developed fever during the hospitalization on 03/31/2016 with acute encephalopathy. Workup showed positive blood cultures for MRSA. Clinically the patient shows improvement in 24  hours. There is a potential that the brain lesions are also septic emboli although neurology does not feel that the lesion appears to be abscess. Unable to obtain MRI due to patient's pacemaker wire not compatible with MRI. Likely source of infection is central lines with Port-A-Cath as well as right IJ catheter.  At present patient is on IV vancomycin. Consulted interventional radiology to remove both of central line. Infectious diseases consulted. Antibiotics initially started for meningitis strength broad spectrum coverage but with MRSA bacteremia, that is the likely cause of acute encephalopathy as well as potential meningitis and therefore the patient is currently on only vancomycin per ID. Pharmacy and infectious diseases monitoring the dosing.  2. DVT Acute on chronic. Lower extremity Dopplers are positive for DVT Not a candidate for anticoagulation due to thrombocytopenia with brain lesions. Underwent IVC filter placement on 03/31/2016. Continue to monitor.  3. Metastatic brain lesions Seizure disorder. Initially diagnosed in September 2017. Also the blood seizures and discharged on Depakote. Due to thrombocytopenia and Depakote was changed to Keppra. Repeat EEG negative for any ongoing seizures and therefore continue with 500 mg Keppra per neurology. Continue Decadron for CNS lesions. Oncology as well as radiation oncology both following the patient. With ongoing MRSA sepsis, currently planned [facility] treatment for patient is on hold. There is also a potential that the patient may not need SRS since that is a potential that the brain lesions could be associated with septic emboli rather than metastatic cancer.  4. Thrombocytopenia. Initially was given heparin for DVT prophylaxis which has been on hold. HIT panel negative. Thought to be secondary to Depakote and despite changing to [place] continues to have thrombocytopenia. At present my discussion with oncology it appears that  this is likely associated with  sepsis. With invasive procedures patient is at high risk for bleeding. Discussed with oncology and patient can be transfused when necessary for platelets without any contraindication.  5. Type 2 diabetes mellitus. Steroid-induced hyperglycemia. Continue Lantus continue sliding scale insulin.  6. Constipation. Continue stool softener at present.  7. Hyponatremia. Likely due to poor oral intake. Improving with IV hydration. Continue to closely monitor.  8. Hyperkalemia. Continue monitoring. Received Kayexalate.  9. Protein calorie malnutrition. In the setting of ongoing sepsis as well as poor oral intake. Dietary consulted.  Pain management: Scheduled Tylenol as well as when necessary Tylenol Activity: Consulted physical therapy Bowel regimen: last BM 04/01/2016 Diet: Cardiac diet aspiration precautions DVT Prophylaxis: mechanical compression device.  Advance goals of care discussion: Full code  Family Communication: family was present at bedside, at the time of interview. The pt provided permission to discuss medical plan with the family. Opportunity was given to ask question and all questions were answered satisfactorily.   Disposition:  Discharge to likely SNF.   Consultants: Palliative care, infectious disease, cardiology Procedures: Echocardiogram  Antibiotics: Anti-infectives    Start     Dose/Rate Route Frequency Ordered Stop   04/01/16 1800  cefTRIAXone (ROCEPHIN) 2 g in dextrose 5 % 50 mL IVPB  Status:  Discontinued     2 g 100 mL/hr over 30 Minutes Intravenous Every 12 hours 04/01/16 1717 04/02/16 0810   04/01/16 1800  ampicillin (OMNIPEN) 2 g in sodium chloride 0.9 % 50 mL IVPB  Status:  Discontinued     2 g 150 mL/hr over 20 Minutes Intravenous Every 4 hours 04/01/16 1717 04/02/16 0810   04/01/16 1800  vancomycin (VANCOCIN) IVPB 750 mg/150 ml premix     750 mg 150 mL/hr over 60 Minutes Intravenous Every 12 hours 04/01/16 1731      04/01/16 1730  vancomycin (VANCOCIN) IVPB 1000 mg/200 mL premix  Status:  Discontinued     1,000 mg 200 mL/hr over 60 Minutes Intravenous  Once 04/01/16 1717 04/01/16 1722        Subjective: Continues to remain confused although shows improvement as compared to yesterday. All of them on command today. Appropriately getting out a conversation  Objective: Physical Exam: Vitals:   04/01/16 2149 04/02/16 0007 04/02/16 0615 04/02/16 1404  BP: 110/69 108/62 97/75 106/62  Pulse: 95 (!) 103 (!) 101 61  Resp: 18 (!) 22 18 18   Temp: 98.4 F (36.9 C)  98.5 F (36.9 C) 99.5 F (37.5 C)  TempSrc: Oral  Oral Oral  SpO2: (!) 88% 100% 96% 96%  Weight:      Height:        Intake/Output Summary (Last 24 hours) at 04/02/16 1428 Last data filed at 04/02/16 1404  Gross per 24 hour  Intake           1472.5 ml  Output              811 ml  Net            661.5 ml   Filed Weights   03/25/16 1022 03/25/16 2036 03/30/16 0550  Weight: 86.6 kg (191 lb) 79.3 kg (174 lb 14.4 oz) 85.9 kg (189 lb 6 oz)    General: Alert, Awake and Oriented to Time, Place and Person. Appear in moderate distress, affect appropriate Eyes: PERRL, Conjunctiva normal ENT: Oral Mucosa clear moist. Neck: NO JVD, NO Abnormal Mass Or lumps Cardiovascular: S1 and S2 Present, aortic systolic Murmur, Respiratory: Bilateral Air entry equal and Decreased, no  use of accessory muscle, Clear to Auscultation, no Crackles, no wheezes Abdomen: Bowel Sound present, Soft and no tenderness Skin: no redness, diffuse Rash, no induration Extremities: no Pedal edema, no calf tenderness Neurologic: Grossly no focal neuro deficit. Bilaterally Equal motor strength  Data Reviewed: CBC:  Recent Labs Lab 03/29/16 0511 03/30/16 0530 04/01/16 0045 04/01/16 1736 04/02/16 0322  WBC 10.0 14.9* 10.0 8.6 6.1  NEUTROABS  --   --   --  8.1* 5.7  HGB 13.0 14.8 14.3 13.6 12.7*  HCT 37.3* 41.8 40.2 38.3* 35.3*  MCV 103.0* 102.0* 102.0* 101.9*  101.4*  PLT 50* 73* 53* 49* 33*   Basic Metabolic Panel:  Recent Labs Lab 03/29/16 0511 03/30/16 0530 03/31/16 2343 04/01/16 1736 04/01/16 1957 04/01/16 2258 04/02/16 0322 04/02/16 0720  NA 131* 132* 130* 127* 128* 127* 128* 130*  K 4.9 4.5 5.2* 5.5*  --   --  5.0  --   CL 96* 92* 89* 89*  --   --  91*  --   CO2 29 30 32 27  --   --  29  --   GLUCOSE 168* 118* 143* 212*  --   --  197*  --   BUN 37* 31* 25* 29*  --   --  27*  --   CREATININE 1.20 0.97 1.04 1.26*  --   --  1.15  --   CALCIUM 8.6* 8.7* 8.7* 8.7*  --   --  8.3*  --   MG  --   --   --   --   --   --  1.2*  --     Liver Function Tests:  Recent Labs Lab 04/01/16 1736 04/02/16 0322  AST 22 18  ALT 18 17  ALKPHOS 96 89  BILITOT 0.9 0.7  PROT 5.2* 4.9*  ALBUMIN 2.2* 1.9*   No results for input(s): LIPASE, AMYLASE in the last 168 hours.  Recent Labs Lab 04/01/16 1736  AMMONIA 29   Coagulation Profile:  Recent Labs Lab 03/31/16 0558 04/02/16 0322  INR 1.09 1.18   Cardiac Enzymes: No results for input(s): CKTOTAL, CKMB, CKMBINDEX, TROPONINI in the last 168 hours. BNP (last 3 results) No results for input(s): PROBNP in the last 8760 hours.  CBG:  Recent Labs Lab 04/01/16 1448 04/01/16 1726 04/01/16 2149 04/02/16 0828 04/02/16 1206  GLUCAP 212* 198* 179* 225* 239*    Studies: Ct Head Wo Contrast  Result Date: 04/01/2016 CLINICAL DATA:  Acute on chronic altered mental status. EXAM: CT HEAD WITHOUT CONTRAST TECHNIQUE: Contiguous axial images were obtained from the base of the skull through the vertex without intravenous contrast. COMPARISON:  Yesterday FINDINGS: Brain: Motion degraded, best obtainable in the circumstances. Isodense parasagittal right frontal brain mass and hyperdense left occipital mass are stable without contrast. No acute hemorrhage, hydrocephalus, or evidence of acute infarct. 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 Vascular:  Atherosclerotic calcification. Skull: No acute finding.  Previous left craniotomy. Sinuses/Orbits: Right otomastoiditis, similar to previous. No coalescing features. Clear nasopharynx. Mild sinusitis. IMPRESSION: 1. Stable motion degraded exam.  No acute intracranial finding. 2. Known right frontal and left occipital masses. 3. Chronic right otomastoiditis. Electronically Signed   By: Monte Fantasia M.D.   On: 04/01/2016 18:44   Dg Chest Port 1 View  Result Date: 04/02/2016 CLINICAL DATA:  Sepsis EXAM: PORTABLE CHEST 1 VIEW COMPARISON:  Chest radiograph February 28, 2016 and chest CT March 30, 2016 FINDINGS:  There is no edema or consolidation. There is mild cardiomegaly with pulmonary vascularity within normal limits. Port-A-Cath tip is at the cavoatrial junction. Right central catheter tip in right atrium. No pneumothorax. Pacemaker leads are attached to the right atrium and right ventricle. There is atherosclerotic calcification in the aorta. No adenopathy. IMPRESSION: Catheter positions as described without pneumothorax. No edema or consolidation. Stable cardiac prominence. There is aortic atherosclerosis. Electronically Signed   By: Lowella Grip III M.D.   On: 04/02/2016 08:53     Scheduled Meds: . acetaminophen  1,000 mg Oral Q8H  . dexamethasone  4 mg Oral Daily  . feeding supplement (GLUCERNA SHAKE)  237 mL Oral BID BM  . folic acid  1 mg Oral Daily  . insulin aspart  0-15 Units Subcutaneous TID WC  . insulin aspart  0-5 Units Subcutaneous QHS  . insulin glargine  5 Units Subcutaneous QHS  . levETIRAcetam  500 mg Oral BID  . magnesium sulfate 1 - 4 g bolus IVPB  2 g Intravenous Once  . mouth rinse  15 mL Mouth Rinse BID  . pantoprazole  40 mg Oral Daily  . polyethylene glycol  17 g Oral BID  . senna-docusate  2 tablet Oral QHS  . sodium chloride flush  3 mL Intravenous Q12H  . vancomycin  750 mg Intravenous Q12H  . vitamin B-12  1,000 mcg Oral Daily   Continuous Infusions: .  sodium chloride 75 mL/hr at 04/01/16 2018   PRN Meds: acetaminophen, ALPRAZolam, morphine injection, ondansetron **OR** ondansetron (ZOFRAN) IV, prochlorperazine, sodium chloride flush  Time spent: 30 minutes  Author: Berle Mull, MD [facility] Pager: [phone] 04/02/2016 2:28 PM  If 7PM-7AM, please contact night-coverage at [email or web address], password [facility]

## 2016-04-03 ENCOUNTER — Other Ambulatory Visit (HOSPITAL_COMMUNITY): Payer: Medicare Other

## 2016-04-03 ENCOUNTER — Telehealth: Payer: Self-pay | Admitting: Radiation Oncology

## 2016-04-03 ENCOUNTER — Inpatient Hospital Stay (HOSPITAL_COMMUNITY): Payer: Medicare Other

## 2016-04-03 DIAGNOSIS — C669 Malignant neoplasm of unspecified ureter: Secondary | ICD-10-CM

## 2016-04-03 DIAGNOSIS — R410 Disorientation, unspecified: Secondary | ICD-10-CM

## 2016-04-03 DIAGNOSIS — A419 Sepsis, unspecified organism: Secondary | ICD-10-CM

## 2016-04-03 HISTORY — PX: IR GENERIC HISTORICAL: IMG1180011

## 2016-04-03 LAB — COMPREHENSIVE METABOLIC PANEL
ALBUMIN: 1.6 g/dL — AB (ref 3.5–5.0)
ALT: 14 U/L — ABNORMAL LOW (ref 17–63)
ANION GAP: 8 (ref 5–15)
AST: 12 U/L — AB (ref 15–41)
Alkaline Phosphatase: 81 U/L (ref 38–126)
BUN: 27 mg/dL — AB (ref 6–20)
CHLORIDE: 91 mmol/L — AB (ref 101–111)
CO2: 29 mmol/L (ref 22–32)
Calcium: 8.4 mg/dL — ABNORMAL LOW (ref 8.9–10.3)
Creatinine, Ser: 1.2 mg/dL (ref 0.61–1.24)
GFR calc Af Amer: >60 mL/min (ref >60)
GFR, EST NON AFRICAN AMERICAN: 54 mL/min — AB (ref >60)
GLUCOSE: 284 mg/dL — AB (ref 65–99)
POTASSIUM: 4.9 mmol/L (ref 3.5–5.1)
Sodium: 128 mmol/L — ABNORMAL LOW (ref 135–145)
Total Bilirubin: 0.8 mg/dL (ref 0.3–1.2)
Total Protein: 4.5 g/dL — ABNORMAL LOW (ref 6.5–8.1)

## 2016-04-03 LAB — CBC WITH DIFFERENTIAL/PLATELET
Basophils Absolute: 0 10*3/uL (ref 0.0–0.1)
Basophils Relative: 0 %
EOS ABS: 0.1 10*3/uL (ref 0.0–0.7)
Eosinophils Relative: 1 %
HCT: 33 % — ABNORMAL LOW (ref 39.0–52.0)
Hemoglobin: 11.5 g/dL — ABNORMAL LOW (ref 13.0–17.0)
Lymphocytes Relative: 7 %
Lymphs Abs: 0.4 10*3/uL — ABNORMAL LOW (ref 0.7–4.0)
MCH: 35.3 pg — ABNORMAL HIGH (ref 26.0–34.0)
MCHC: 34.8 g/dL (ref 30.0–36.0)
MCV: 101.2 fL — ABNORMAL HIGH (ref 78.0–100.0)
MONO ABS: 0.3 10*3/uL (ref 0.1–1.0)
Monocytes Relative: 5 %
NEUTROS PCT: 87 %
Neutro Abs: 4.2 10*3/uL (ref 1.7–7.7)
PLATELETS: 22 10*3/uL — AB (ref 150–400)
RBC: 3.26 MIL/uL — AB (ref 4.22–5.81)
RDW: 12.6 % (ref 11.5–15.5)
WBC: 5 10*3/uL (ref 4.0–10.5)

## 2016-04-03 LAB — URINALYSIS, ROUTINE W REFLEX MICROSCOPIC
Bilirubin Urine: NEGATIVE
Glucose, UA: >1000 mg/dL — AB
KETONES UR: NEGATIVE mg/dL
Nitrite: POSITIVE — AB
PROTEIN: 30 mg/dL — AB
Specific Gravity, Urine: 1.025 (ref 1.005–1.030)
pH: 5.5 (ref 5.0–8.0)

## 2016-04-03 LAB — URINE MICROSCOPIC-ADD ON

## 2016-04-03 LAB — GLUCOSE, CAPILLARY
GLUCOSE-CAPILLARY: 254 mg/dL — AB (ref 65–99)
GLUCOSE-CAPILLARY: 236 mg/dL — AB (ref 65–99)
GLUCOSE-CAPILLARY: 232 mg/dL — AB (ref 65–99)

## 2016-04-03 LAB — PROLACTIN: PROLACTIN: 20.1 ng/mL — AB (ref 4.0–15.2)

## 2016-04-03 LAB — MAGNESIUM: MAGNESIUM: 1.7 mg/dL (ref 1.7–2.4)

## 2016-04-03 LAB — PLATELET COUNT: PLATELETS: 44 10*3/uL — AB (ref 150–400)

## 2016-04-03 MED ORDER — CHLORHEXIDINE GLUCONATE 4 % EX LIQD
CUTANEOUS | Status: DC | PRN
  Administered 2016-04-03: 1 via TOPICAL

## 2016-04-03 MED ORDER — LIDOCAINE HCL 1 % IJ SOLN
INTRAMUSCULAR | Status: DC | PRN
  Administered 2016-04-03: 8 mL

## 2016-04-03 MED ORDER — SODIUM CHLORIDE 0.9 % IV SOLN: Freq: Once | INTRAVENOUS | Status: DC

## 2016-04-03 MED ORDER — LIDOCAINE HCL 1 % IJ SOLN
INTRAMUSCULAR | Status: AC
  Filled 2016-04-03: qty 20

## 2016-04-03 MED ORDER — CHLORHEXIDINE GLUCONATE 4 % EX LIQD
CUTANEOUS | Status: AC
  Filled 2016-04-03: qty 15

## 2016-04-03 MED ORDER — SODIUM CHLORIDE 0.9 % IV SOLN
Freq: Once | INTRAVENOUS | Status: AC
  Administered 2016-04-03: 18:00:00 via INTRAVENOUS

## 2016-04-03 NOTE — Progress Notes (Signed)
Subjective: Patient is fairly obtunded   Antibiotics:  Anti-infectives    Start     Dose/Rate Route Frequency Ordered Stop   04/01/16 1800  cefTRIAXone (ROCEPHIN) 2 g in dextrose 5 % 50 mL IVPB  Status:  Discontinued     2 g 100 mL/hr over 30 Minutes Intravenous Every 12 hours 04/01/16 1717 04/02/16 0810   04/01/16 1800  ampicillin (OMNIPEN) 2 g in sodium chloride 0.9 % 50 mL IVPB  Status:  Discontinued     2 g 150 mL/hr over 20 Minutes Intravenous Every 4 hours 04/01/16 1717 04/02/16 0810   04/01/16 1800  vancomycin (VANCOCIN) IVPB 750 mg/150 ml premix     750 mg 150 mL/hr over 60 Minutes Intravenous Every 12 hours 04/01/16 1731     04/01/16 1730  vancomycin (VANCOCIN) IVPB 1000 mg/200 mL premix  Status:  Discontinued     1,000 mg 200 mL/hr over 60 Minutes Intravenous  Once 04/01/16 1717 04/01/16 1722      Medications: Scheduled Meds: . sodium chloride   Intravenous Once  . sodium chloride   Intravenous Once  . acetaminophen  1,000 mg Oral Q8H  . dexamethasone  4 mg Oral Daily  . feeding supplement (GLUCERNA SHAKE)  237 mL Oral BID BM  . folic acid  1 mg Oral Daily  . insulin aspart  0-15 Units Subcutaneous TID WC  . insulin aspart  0-5 Units Subcutaneous QHS  . insulin glargine  5 Units Subcutaneous QHS  . levETIRAcetam  500 mg Oral BID  . mouth rinse  15 mL Mouth Rinse BID  . pantoprazole  40 mg Oral Daily  . polyethylene glycol  17 g Oral BID  . senna-docusate  2 tablet Oral QHS  . sodium chloride flush  3 mL Intravenous Q12H  . vancomycin  750 mg Intravenous Q12H  . vitamin B-12  1,000 mcg Oral Daily   Continuous Infusions: . sodium chloride 75 mL/hr at 04/03/16 0629   PRN Meds:.acetaminophen, ALPRAZolam, morphine injection, ondansetron **OR** ondansetron (ZOFRAN) IV, prochlorperazine, sodium chloride flush    Objective: Weight change:   Intake/Output Summary (Last 24 hours) at 04/03/16 1224 Last data filed at 04/03/16 1140  Gross per 24 hour    Intake             1357 ml  Output              550 ml  Net              807 ml   Blood pressure (!) 108/53, pulse 61, temperature 98.3 F (36.8 C), temperature source Oral, resp. rate 18, height 5\' 9"  (1.753 m), weight 189 lb 6 oz (85.9 kg), SpO2 99 %. Temp:  [97.6 F (36.4 C)-99.5 F (37.5 C)] 98.3 F (36.8 C) (10/13 1140) Pulse Rate:  [60-74] 61 (10/13 1140) Resp:  [17-18] 18 (10/13 1140) BP: (97-110)/(51-62) 108/53 (10/13 1140) SpO2:  [96 %-100 %] 99 % (10/13 1140)  Physical Exam: HEENT: anicteric sclera,  EOMI, oropharynx dry he is oriented to person with prompting to [facility] Cardiovascular: bardycardic ,no murmur rubs or gallops Pulmonary: clear to auscultation bilaterally, no wheezing, rales or rhonchi Gastrointestinal: soft nontender, nondistended, normal bowel sounds, Musculoskeletal: 2+ edema Skin: multiple ecchymoses, PM site not obviously infected, PICC and porta cath in place Neuro: nonfocal, strength and sensation intact   CBC:  CBC Latest Ref Rng & Units 04/03/2016 04/02/2016 04/01/2016  WBC 4.0 - 10.5 K/uL 5.0  6.1 8.6  Hemoglobin 13.0 - 17.0 g/dL 11.5(L) 12.7(L) 13.6  Hematocrit 39.0 - 52.0 % 33.0(L) 35.3(L) 38.3(L)  Platelets 150 - 400 K/uL 22(LL) 33(L) 49(L)     BMET  Recent Labs  04/02/16 0322 04/02/16 0720 04/03/16 0544  NA 128* 130* 128*  K 5.0  --  4.9  CL 91*  --  91*  CO2 29  --  29  GLUCOSE 197*  --  284*  BUN 27*  --  27*  CREATININE 1.15  --  1.20  CALCIUM 8.3*  --  8.4*     Liver Panel   Recent Labs  04/02/16 0322 04/03/16 0544  PROT 4.9* 4.5*  ALBUMIN 1.9* 1.6*  AST 18 12*  ALT 17 14*  ALKPHOS 89 81  BILITOT 0.7 0.8       Sedimentation Rate No results for input(s): ESRSEDRATE in the last 72 hours. C-Reactive Protein No results for input(s): CRP in the last 72 hours.  Micro Results: Recent Results (from the past 720 hour(s))  Surgical pcr screen     Status: Abnormal   Collection Time: 03/31/16  4:56 AM  Result  Value Ref Range Status   MRSA, PCR POSITIVE (A) NEGATIVE Final    Comment: RESULT CALLED TO, READ BACK BY AND VERIFIED WITH: C DANIELS,RN @0652  03/31/16 MKELLY,MLT    Staphylococcus aureus POSITIVE (A) NEGATIVE Final    Comment:        The Xpert SA Assay (FDA approved for NASAL specimens in patients over 69 years of age), is one component of a comprehensive surveillance program.  Test performance has been validated by [facility] for patients greater than or equal to 43 year old. It is not intended to diagnose infection nor to guide or monitor treatment.   Culture, blood (routine x 2)     Status: Abnormal (Preliminary result)   Collection Time: 04/01/16  5:27 PM  Result Value Ref Range Status   Specimen Description BLOOD RIGHT ANTECUBITAL  Final   Special Requests BOTTLES DRAWN AEROBIC ONLY 5CC  Final   Culture  Setup Time   Final    GRAM POSITIVE COCCI IN CLUSTERS AEROBIC BOTTLE ONLY Organism ID to follow CRITICAL RESULT CALLED TO, READ BACK BY AND VERIFIED WITH: M MACCIA,PHARMD AT [id number] 04/02/16 BY L BENFIELD    Culture (A)  Final    STAPHYLOCOCCUS AUREUS SUSCEPTIBILITIES TO FOLLOW    Report Status PENDING  Incomplete  Blood Culture ID Panel (Reflexed)     Status: Abnormal   Collection Time: 04/01/16  5:27 PM  Result Value Ref Range Status   Enterococcus species NOT DETECTED NOT DETECTED Final   Listeria monocytogenes NOT DETECTED NOT DETECTED Final   Staphylococcus species DETECTED (A) NOT DETECTED Final    Comment: CRITICAL RESULT CALLED TO, READ BACK BY AND VERIFIED WITH: M MACCIA,PHARMD AT [id number] 04/02/16 BY L BENFIELD    Staphylococcus aureus DETECTED (A) NOT DETECTED Final    Comment: CRITICAL RESULT CALLED TO, READ BACK BY AND VERIFIED WITH: M MACCIA,PHARMD AT [id number] 04/02/16 BY L BENFIELD    Methicillin resistance DETECTED (A) NOT DETECTED Final    Comment: CRITICAL RESULT CALLED TO, READ BACK BY AND VERIFIED WITH: M MACCIA,PHARMD AT [id number] 04/02/16 BY L  BENFIELD    Streptococcus species NOT DETECTED NOT DETECTED Final   Streptococcus agalactiae NOT DETECTED NOT DETECTED Final   Streptococcus pneumoniae NOT DETECTED NOT DETECTED Final   Streptococcus pyogenes NOT DETECTED NOT DETECTED Final   Acinetobacter baumannii NOT  DETECTED NOT DETECTED Final   Enterobacteriaceae species NOT DETECTED NOT DETECTED Final   Enterobacter cloacae complex NOT DETECTED NOT DETECTED Final   Escherichia coli NOT DETECTED NOT DETECTED Final   Klebsiella oxytoca NOT DETECTED NOT DETECTED Final   Klebsiella pneumoniae NOT DETECTED NOT DETECTED Final   Proteus species NOT DETECTED NOT DETECTED Final   Serratia marcescens NOT DETECTED NOT DETECTED Final   Haemophilus influenzae NOT DETECTED NOT DETECTED Final   Neisseria meningitidis NOT DETECTED NOT DETECTED Final   Pseudomonas aeruginosa NOT DETECTED NOT DETECTED Final   Candida albicans NOT DETECTED NOT DETECTED Final   Candida glabrata NOT DETECTED NOT DETECTED Final   Candida krusei NOT DETECTED NOT DETECTED Final   Candida parapsilosis NOT DETECTED NOT DETECTED Final   Candida tropicalis NOT DETECTED NOT DETECTED Final  Culture, blood (routine x 2)     Status: Abnormal (Preliminary result)   Collection Time: 04/01/16  5:40 PM  Result Value Ref Range Status   Specimen Description BLOOD LEFT ARM  Final   Special Requests IN PEDIATRIC BOTTLE 4CC  Final   Culture  Setup Time   Final    GRAM POSITIVE COCCI IN CLUSTERS IN PEDIATRIC BOTTLE CRITICAL VALUE NOTED.  VALUE IS CONSISTENT WITH PREVIOUSLY REPORTED AND CALLED VALUE.    Culture (A)  Final    STAPHYLOCOCCUS AUREUS SUSCEPTIBILITIES TO FOLLOW    Report Status PENDING  Incomplete  Culture, blood (x 2)     Status: None (Preliminary result)   Collection Time: 04/02/16  8:19 AM  Result Value Ref Range Status   Specimen Description BLOOD RIGHT ARM  Final   Special Requests BOTTLES DRAWN AEROBIC AND ANAEROBIC 10CC EACH  Final   Culture NO GROWTH 1 DAY   Final   Report Status PENDING  Incomplete  Culture, blood (x 2)     Status: Abnormal (Preliminary result)   Collection Time: 04/02/16  8:29 AM  Result Value Ref Range Status   Specimen Description BLOOD RIGHT HAND  Final   Special Requests IN PEDIATRIC BOTTLE 4CC  Final   Culture  Setup Time   Final    AEROBIC BOTTLE ONLY GRAM POSITIVE COCCI IN CLUSTERS CRITICAL VALUE NOTED.  VALUE IS CONSISTENT WITH PREVIOUSLY REPORTED AND CALLED VALUE.    Culture (A)  Final    STAPHYLOCOCCUS AUREUS SUSCEPTIBILITIES TO FOLLOW    Report Status PENDING  Incomplete    Studies/Results: Ct Head Wo Contrast  Result Date: 04/01/2016 CLINICAL DATA:  Acute on chronic altered mental status. EXAM: CT HEAD WITHOUT CONTRAST TECHNIQUE: Contiguous axial images were obtained from the base of the skull through the vertex without intravenous contrast. COMPARISON:  Yesterday FINDINGS: Brain: Motion degraded, best obtainable in the circumstances. Isodense parasagittal right frontal brain mass and hyperdense left occipital mass are stable without contrast. No acute hemorrhage, hydrocephalus, or evidence of acute infarct. 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 Vascular: Atherosclerotic calcification. Skull: No acute finding.  Previous left craniotomy. Sinuses/Orbits: Right otomastoiditis, similar to previous. No coalescing features. Clear nasopharynx. Mild sinusitis. IMPRESSION: 1. Stable motion degraded exam.  No acute intracranial finding. 2. Known right frontal and left occipital masses. 3. Chronic right otomastoiditis. Electronically Signed   By: Monte Fantasia M.D.   On: 04/01/2016 18:44   Dg Chest Port 1 View  Result Date: 04/02/2016 CLINICAL DATA:  Sepsis EXAM: PORTABLE CHEST 1 VIEW COMPARISON:  Chest radiograph February 28, 2016 and chest CT March 30, 2016 FINDINGS: There  is no edema or consolidation. There is mild cardiomegaly with pulmonary vascularity within normal  limits. Port-A-Cath tip is at the cavoatrial junction. Right central catheter tip in right atrium. No pneumothorax. Pacemaker leads are attached to the right atrium and right ventricle. There is atherosclerotic calcification in the aorta. No adenopathy. IMPRESSION: Catheter positions as described without pneumothorax. No edema or consolidation. Stable cardiac prominence. There is aortic atherosclerosis. Electronically Signed   By: Lowella Grip III M.D.   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 male with    Atrial fibrillation, Pacemaker Parotid gland tumor, transitional cell carcinoma, on chemotherapy found to have new necrotic lesions in the brain thought to be malignancy along with pulmonary emboli, with worsening confusion now found to have methicillin resistant Staphylococcus aureus bacteremia and sepsis PM infection, DIC   #1 MRSA bacteremia, PM infection +/- septic emboli, CNS infection                                           [facility] [facility] Staphylococcus aureus bacteremia  Staphylococcus aureus bacteremia (SAB) is associated with a high rate of complications and mortality.  Specific aspects of clinical management are critical to optimizing the outcome of  patients with SAB.  Therefore, the [facility] Antimicrobial Management Team [facility]) has initiated an intervention aimed at improving the management of SAB at [facility].  To do so, Infectious Diseases physicians are providing an evidence-based consult for the management of all patients with SAB.     Yes No Comments  Perform follow-up blood cultures (even if the patient is afebrile) to ensure clearance of bacteremia [x]  []  These Need be repeated again after his lines are all removed   Remove vascular catheter and obtain follow-up blood cultures after the removal of the catheter []  []  His Port-A-Cath and his PICC line need to be removed and repeat blood cultures taken   Perform echocardiography to evaluate for endocarditis (transthoracic ECHO is 40-50% sensitive, TEE is > 90% sensitive) []  []  Please keep in mind, that neither test can definitively EXCLUDE endocarditis, and that should clinical suspicion remain high for endocarditis the patient should then still be treated with an "endocarditis" duration of therapy = 6 weeks  I have ordered a 2-D echocardiogram. I don't think a transesophageal echocardiogram will change much to the management of this patient given his poor prognosis.   Consult electrophysiologist to evaluate implanted cardiac device (pacemaker, ICD) []  []   I've consulted electrophysiology and they agree that the only Reasonable treatment option in this patient is to try IV antibiotics followed by suppressive antibiotics. This approach will of course  also be doomed to failure.  This is further reason why the patient's goals of care need to be recalibrated see below       Ensure source control []  []  Have all abscesses been drained effectively? Have deep seeded infections (septic joints or osteomyelitis) had appropriate surgical debridement?  Presumed porta cath or PICC could have been source but device will remain infected for the rest of his life   Investigate for  "metastatic" sites of infection []  []  Does the patient have ANY symptom or physical exam finding that would suggest a deeper infection (back or neck pain that may be suggestive of vertebral osteomyelitis or epidural abscess, muscle pain that could be a symptom of pyomyositis)?  Keep in mind that for deep seeded infections MRI imaging with contrast is preferred rather than other often insensitive tests such as plain x-rays, especially early in a patient's presentation.   ? If brain lesions aren't fact metastases versus infection.  Change antibiotic therapy to Vancomycin  []  []  Beta-lactam antibiotics are preferred for MSSA due to higher cure rates.   If on Vancomycin, goal trough should be 15 - 20 mcg/mL  Estimated duration of IV antibiotic therapy:  6 weeks of intravenous vancomycin plus lifelong oral antibiotics d  []  []  Consult case management for probably prolonged outpatient IV antibiotic therapy     #2 Goals of care: STRONGLY RECOMMEND CHANGE OF COURSE TO PURE PALLIATIVE CARE ONE.   Dr. Baxter Flattery is covering this weekend and for 2nd part of October is on ID consults at [facility].    LOS: 8 days   Alcide Evener 04/03/2016, 12:24 PM

## 2016-04-03 NOTE — Progress Notes (Addendum)
[facility] Progress Note  Patient: [name] [id number]   PCP: [name], MD DOB: [birth year]   DOA: 03/25/2016   DOS: 04/03/2016   Date of Service: the patient was seen and examined on 04/03/2016  Brief hospital course: PRABHNOOR GLENNEY an 82 y.o.malewith H/o atrial fibrillation not on anticoagulation, pacemaker,mod AS, subdural hematoma, diet controlled diabetes, transitional cell carcinoma with intermittent hematuria, s/p chemotherapy, TIA, HTN was discharged from [facility] 9/13 to [facility] for rehab after long hospitalization for new onset seizures and possible brain metastasis noted on CT head, he was seen by Neurology, Neurosurgery and Oncology, couldn't have an MRI due to pacer. Finally plan was for FU with Dr.Shadad in Nov with plan for repeat CT with SRS protocol. Patient was discharged from [facility] on 9/13. Then readmitted with weakness and failure to thrive, noted to have AKI, creatinine of 1.9 from baseline of 1, NA was 126, K was 6.2 CT head 03/31/2016 shows necrotic right frontal mass 2 cm, left occipital mass 12 mm. Neurology, Rad Onc and Oncology following, unable to have MRI. Discussed again on tumor board 10/9 Palliative consulted 10/9: pleuritic chest pain-CTA chest with acute on chronic PE-not anticoagulation candidate 10/10: IVC filter 10/11 developing acute encephalopathy, workup shows MRSA sepsis Currently further plan is continue IV antibiotics.  Assessment and Plan: 1. MRSA bacteremia  Sepsis associated with bacteremia. Multifocal nodular lung opacities on CT chest, septic emboli  Patient presents with complaints of weakness and failure to thrive. Recently admitted for new onset seizures as well as brain lesions suspicious for metastasis. Patient developed fever during the hospitalization on 03/31/2016 with acute encephalopathy. Workup showed positive blood cultures for MRSA. There is a potential that the brain lesions are also  septic emboli although neurology does not feel that the lesion appears to be abscess. Unable to obtain MRI due to patient's pacemaker wire not compatible with MRI. Likely source of infection is central lines with Port-A-Cath as well as right IJ catheter.  At present patient is on IV vancomycin. Consulted interventional radiology to remove both of central line. Removed on 04/03/2016. Infectious diseases consulted. MRSA likely also causing possible meningitis. Pharmacy and infectious diseases monitoring the dosing. Not a candidate for pacemaker extraction, appreciate EP consult.  2. DVT Acute on chronic. Lower extremity Dopplers are positive for DVT Not a candidate for anticoagulation due to thrombocytopenia with brain lesions. Underwent IVC filter placement on 03/31/2016. Continue to monitor. For left upper extremity swelling of his check vascular ultrasound to rule out DVT that as well.  3. Metastatic brain lesions Seizure disorder. Initially diagnosed in September 2017. Also the blood seizures and discharged on Depakote. Due to thrombocytopenia and Depakote was changed to Keppra. Repeat EEG negative for any ongoing seizures and therefore continue with 500 mg Keppra per neurology. Continue Decadron for CNS lesions. Oncology as well as radiation oncology both following the patient. With ongoing MRSA sepsis, currently planned [facility] treatment for patient is on hold. There is also a potential that the patient may not need SRS since there is a potential that the brain lesions could be associated with septic emboli rather than metastatic cancer.  4. Thrombocytopenia. Initially was given heparin for DVT prophylaxis which has been on hold. HIT panel negative. Thought to be secondary to Depakote and despite changing to [place] continues to have thrombocytopenia. At present my discussion with oncology it appears that this is likely associated with sepsis. With invasive procedures patient is at  high risk for  bleeding. Discussed with oncology and patient can be transfused when necessary for platelets without any contraindication. Discussed with oncology as well as interventional radiology today regarding platelets and patient was given 1 unit of platelet transfusion prior to procedure.  5. Type 2 diabetes mellitus. Steroid-induced hyperglycemia. Continue Lantus continue sliding scale insulin.  6. Constipation. Continue stool softener at present.  7. Hyponatremia. Likely due to poor oral intake. Improving with IV hydration. Continue to closely monitor.  8. Hyperkalemia. Continue monitoring. Received Kayexalate.  9. Protein calorie malnutrition. In the setting of ongoing sepsis as well as poor oral intake. Dietary consulted.  Pain management: Scheduled Tylenol as well as when necessary Tylenol Activity: Consulted physical therapy Bowel regimen: last BM 04/01/2016 Diet: Cardiac diet aspiration precautions DVT Prophylaxis: mechanical compression device.  Advance goals of care discussion: Full code  Family Communication: no family was present at bedside, at the time of interview.  Disposition:  Discharge to likely SNF.   Consultants: Palliative care, infectious disease, cardiology Procedures: Echocardiogram  Antibiotics: Anti-infectives    Start     Dose/Rate Route Frequency Ordered Stop   04/01/16 1800  cefTRIAXone (ROCEPHIN) 2 g in dextrose 5 % 50 mL IVPB  Status:  Discontinued     2 g 100 mL/hr over 30 Minutes Intravenous Every 12 hours 04/01/16 1717 04/02/16 0810   04/01/16 1800  ampicillin (OMNIPEN) 2 g in sodium chloride 0.9 % 50 mL IVPB  Status:  Discontinued     2 g 150 mL/hr over 20 Minutes Intravenous Every 4 hours 04/01/16 1717 04/02/16 0810   04/01/16 1800  vancomycin (VANCOCIN) IVPB 750 mg/150 ml premix     750 mg 150 mL/hr over 60 Minutes Intravenous Every 12 hours 04/01/16 1731     04/01/16 1730  vancomycin (VANCOCIN) IVPB 1000 mg/200 mL premix   Status:  Discontinued     1,000 mg 200 mL/hr over 60 Minutes Intravenous  Once 04/01/16 1717 04/01/16 1722     Subjective: Somewhat improvement in confusion, denies any acute complaint. Continues to have the chest pain and abdominal pain.  Objective: Physical Exam: Vitals:   04/03/16 1140 04/03/16 1645 04/03/16 1700 04/03/16 1715  BP: (!) 108/53 108/66 101/62 101/64  Pulse: 61     Resp: 18 18 20 18   Temp: 98.3 F (36.8 C) 98.2 F (36.8 C) 98.2 F (36.8 C)   TempSrc: Oral Oral Oral   SpO2: 99% 98% 98% 99%  Weight:      Height:        Intake/Output Summary (Last 24 hours) at 04/03/16 1920 Last data filed at 04/03/16 1140  Gross per 24 hour  Intake             1357 ml  Output              250 ml  Net             1107 ml   Filed Weights   03/25/16 1022 03/25/16 2036 03/30/16 0550  Weight: 86.6 kg (191 lb) 79.3 kg (174 lb 14.4 oz) 85.9 kg (189 lb 6 oz)    General: Alert, Awake and Oriented to Time, Place and Person. Appear in moderate distress, affect appropriate Eyes: PERRL, Conjunctiva normal ENT: Oral Mucosa clear moist. Neck: NO JVD, NO Abnormal Mass Or lumps Cardiovascular: S1 and S2 Present, aortic systolic Murmur, Respiratory: Bilateral Air entry equal and Decreased, no use of accessory muscle, Clear to Auscultation, no Crackles, no wheezes Abdomen: Bowel Sound present, Soft and no  tenderness Skin: no redness, diffuse Rash, no induration Extremities: no Pedal edema, no calf tenderness Neurologic: Grossly no focal neuro deficit. Bilaterally Equal motor strength  Data Reviewed: CBC:  Recent Labs Lab 03/30/16 0530 04/01/16 0045 04/01/16 1736 04/02/16 0322 04/03/16 0544 04/03/16 1451  WBC 14.9* 10.0 8.6 6.1 5.0  --   NEUTROABS  --   --  8.1* 5.7 4.2  --   HGB 14.8 14.3 13.6 12.7* 11.5*  --   HCT 41.8 40.2 38.3* 35.3* 33.0*  --   MCV 102.0* 102.0* 101.9* 101.4* 101.2*  --   PLT 73* 53* 49* 33* 22* 44*   Basic Metabolic Panel:  Recent Labs Lab  03/30/16 0530 03/31/16 2343 04/01/16 1736 04/01/16 1957 04/01/16 2258 04/02/16 0322 04/02/16 0720 04/03/16 0544  NA 132* 130* 127* 128* 127* 128* 130* 128*  K 4.5 5.2* 5.5*  --   --  5.0  --  4.9  CL 92* 89* 89*  --   --  91*  --  91*  CO2 30 32 27  --   --  29  --  29  GLUCOSE 118* 143* 212*  --   --  197*  --  284*  BUN 31* 25* 29*  --   --  27*  --  27*  CREATININE 0.97 1.04 1.26*  --   --  1.15  --  1.20  CALCIUM 8.7* 8.7* 8.7*  --   --  8.3*  --  8.4*  MG  --   --   --   --   --  1.2*  --  1.7    Liver Function Tests:  Recent Labs Lab 04/01/16 1736 04/02/16 0322 04/03/16 0544  AST 22 18 12*  ALT 18 17 14*  ALKPHOS 96 89 81  BILITOT 0.9 0.7 0.8  PROT 5.2* 4.9* 4.5*  ALBUMIN 2.2* 1.9* 1.6*   No results for input(s): LIPASE, AMYLASE in the last 168 hours.  Recent Labs Lab 04/01/16 1736  AMMONIA 29   Coagulation Profile:  Recent Labs Lab 03/31/16 0558 04/02/16 0322  INR 1.09 1.18   Cardiac Enzymes: No results for input(s): CKTOTAL, CKMB, CKMBINDEX, TROPONINI in the last 168 hours. BNP (last 3 results) No results for input(s): PROBNP in the last 8760 hours.  CBG:  Recent Labs Lab 04/02/16 1206 04/02/16 1637 04/02/16 2133 04/03/16 0813 04/03/16 1229  GLUCAP 239* 292* 270* 254* 236*    Studies: No results found.   Scheduled Meds: . sodium chloride   Intravenous Once  . sodium chloride   Intravenous Once  . acetaminophen  1,000 mg Oral Q8H  . chlorhexidine      . dexamethasone  4 mg Oral Daily  . feeding supplement (GLUCERNA SHAKE)  237 mL Oral BID BM  . folic acid  1 mg Oral Daily  . insulin aspart  0-15 Units Subcutaneous TID WC  . insulin aspart  0-5 Units Subcutaneous QHS  . insulin glargine  5 Units Subcutaneous QHS  . levETIRAcetam  500 mg Oral BID  . lidocaine      . mouth rinse  15 mL Mouth Rinse BID  . pantoprazole  40 mg Oral Daily  . polyethylene glycol  17 g Oral BID  . senna-docusate  2 tablet Oral QHS  . sodium chloride  flush  3 mL Intravenous Q12H  . vancomycin  750 mg Intravenous Q12H  . vitamin B-12  1,000 mcg Oral Daily   Continuous Infusions: . sodium chloride 75 mL/hr  at 04/03/16 0629   PRN Meds: acetaminophen, ALPRAZolam, chlorhexidine, lidocaine, morphine injection, ondansetron **OR** ondansetron (ZOFRAN) IV, prochlorperazine, sodium chloride flush  Time spent: 30 minutes  Author: Berle Mull, MD [facility] Pager: [phone] 04/03/2016 7:20 PM  If 7PM-7AM, please contact night-coverage at [email or web address], password [facility]

## 2016-04-03 NOTE — Progress Notes (Signed)
PT Cancellation Note  Patient Details Name: [name] MRN: [id number] DOB: December 25, 1929   Cancelled Treatment:    Reason Eval/Treat Not Completed: Medical issues which prohibited therapy; Platelet count 22 and has procedure this pm. Hold today per RN. PT will continue to follow acutely.    Salina April, PTA Pager: [phone]   04/03/2016, 11:34 AM

## 2016-04-03 NOTE — Telephone Encounter (Signed)
Faxed PACEMAKER FORM to Dr. Sallyanne Kuster to complete. Fax confirmation of delivery obtained.

## 2016-04-03 NOTE — Progress Notes (Signed)
Palliative Medicine RN Note: Followed up with pt/his nurse. No family present. Patient is HOH, but even when he can hear the question, he struggles to answer questions. He is NPO now for expected removal of port a cath. Platelets are 22; RN verbalized concern for bleeding after surgery.  Plan for follow up by PMT over the weekend. I expect that pt's daughter will need a lot of support and discussion about pt's care and plan going forward.  Marjie Skiff Mckay Brandt, RN, BSN, [facility] 04/03/2016 11:26 AM Cell [phone] 8:00-4:00 Monday-Friday Office [phone]

## 2016-04-03 NOTE — Progress Notes (Signed)
IP PROGRESS NOTE  Subjective:   Mr. Wixon is more awake interactive today. He is oriented to person and place. He denies any complaints but extremely hard of hearing. He denied any bleeding issues at this time.  Objective:  Vital signs in last 24 hours: Temp:  [97.6 F (36.4 C)-99.5 F (37.5 C)] 98.3 F (36.8 C) (10/13 1140) Pulse Rate:  [60-74] 61 (10/13 1140) Resp:  [17-18] 18 (10/13 1140) BP: (97-110)/(51-62) 108/53 (10/13 1140) SpO2:  [96 %-100 %] 99 % (10/13 1140) Weight change:  Last BM Date: 04/01/16  Intake/Output from previous day: 10/12 0701 - 10/13 0700 In: 1160 [P.O.:110; I.V.:900; IV Piggyback:150] Out: 300 [Urine:300]  Elderly frail gentleman. I will awake and oriented. Mouth: mucous membranes moist, pharynx normal without lesions no bleeding noted in his oropharynx. Resp: clear to auscultation bilaterally Cardio: regular rate and rhythm, S1, S2 normal, no murmur, click, rub or gallop GI: soft, non-tender; bowel sounds normal; no masses,  no organomegaly Extremities: extremities normal, atraumatic, no cyanosis or edema Skin: Ecchymosis noted on his upper extremities. Neurological: No focal deficits noted. No bleeding around his right subclavian catheter.  Lab Results:  Recent Labs  04/02/16 0322 04/03/16 0544  WBC 6.1 5.0  HGB 12.7* 11.5*  HCT 35.3* 33.0*  PLT 33* 22*    BMET  Recent Labs  04/02/16 0322 04/02/16 0720 04/03/16 0544  NA 128* 130* 128*  K 5.0  --  4.9  CL 91*  --  91*  CO2 29  --  29  GLUCOSE 197*  --  284*  BUN 27*  --  27*  CREATININE 1.15  --  1.20  CALCIUM 8.3*  --  8.4*     Medications: I have reviewed the patient's current medications.  Assessment/Plan:     82 year old gentleman with the following issues:  1. 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 CT scan in January 2017 showed pelvic adenopathy indicating  metastatic disease.  He is status post salvage chemotherapy utilizing carboplatin and gemcitabine with his last CT scan obtained in September 2017 showed no evidence of metastatic disease.Further systemic therapy is on hold at this time.  2. Brain masses: Suspicious for metastatic disease although not confirmed. Treatment for those lesions will be in the form of radiation under the care of Dr. Tammi Klippel once he is clinically stable.  3. Delirium: Likely related to sepsis and bacteremia. Seems to be improving.  4. Thrombocytopenia: Related to sepsis without bleeding at this time. I see no evidence to suggest HIT or DIC. I agree with supportive management as you're doing. He is to have a Port-A-Cath removed in the near future and I have no objections to platelet transfusion.  I will see her in follow-up next week. Please call with any questions over the weekend.    LOS: 8 days   [id number] 04/03/2016, 12:45 PM

## 2016-04-03 NOTE — Progress Notes (Signed)
CRITICAL VALUE ALERT  Critical value received:  Platelets=22  Date of notification:  04/03/16  Time of notification:  0634  Critical value read back:Yes.    Nurse who received alert:  Stephanie Coup  MD notified (1st page):  MD on call Baltazar Najjar  Time of first page:  [phone]  MD notified (2nd page):  Time of second page:  Responding MD:  none  Time MD responded:  none

## 2016-04-03 NOTE — Progress Notes (Signed)
Results for KELLEE, KNEECE (MRN [id number]) as of 04/03/2016 08:46  Ref. Range 04/02/2016 08:28 04/02/2016 12:06 04/02/2016 16:37 04/02/2016 21:33 04/03/2016 08:13  Glucose-Capillary Latest Ref Range: 65 - 99 mg/dL 225 (H) 239 (H) 292 (H) 270 (H) 254 (H)   CBGs continue to be elevated and greater than 180 mg/dl. Recommend increasing Lantus to 10 units daily if CBGs continue to be elevated. Continue Novolog MODERATE correction scale TID & HS if eating and every 4 hours when NPO. Harvel Ricks RN BSN CDE

## 2016-04-03 NOTE — Progress Notes (Signed)
Assumed care of pt. Awake and alert. In no distress. Platelets infusing via left upper arm IV.

## 2016-04-03 NOTE — Progress Notes (Signed)
Procedure completed. Tolerated well. Report given to [facility], [place]

## 2016-04-03 NOTE — Progress Notes (Signed)
Platelets are 22K.  Dr. Kathlene Cote would like for the platelets to be around 50K prior to proceeding.  Spoke with Dr. Posey Pronto and he will give platelets and we will check CBC once these are complete.  If they are adequate we will try to proceed with removal today, but if not then we will have to give more and plan for tomorrow, likely.  Brannon Decaire E 11:02 AM 04/03/2016

## 2016-04-04 ENCOUNTER — Inpatient Hospital Stay (HOSPITAL_COMMUNITY): Payer: Medicare Other

## 2016-04-04 ENCOUNTER — Other Ambulatory Visit (HOSPITAL_COMMUNITY): Payer: Medicare Other

## 2016-04-04 DIAGNOSIS — R7881 Bacteremia: Secondary | ICD-10-CM

## 2016-04-04 LAB — CULTURE, BLOOD (ROUTINE X 2)

## 2016-04-04 LAB — ECHOCARDIOGRAM COMPLETE
AO mean calculated velocity dopler: 191 cm/s
AOPV: 0.32 m/s
AV Area VTI index: 0.52 cm2/m2
AV Area VTI: 1.12 cm2
AV Mean grad: 17 mmHg
AV Peak grad: 31 mmHg
AV peak Index: 0.54
AV pk vel: 278 cm/s
AVA: 1.08 cm2
AVAREAMEANV: 1.05 cm2
AVAREAMEANVIN: 0.51 cm2/m2
AVCELMEANRAT: 0.3
CHL CUP AV VALUE AREA INDEX: 0.52
CHL CUP AV VEL: 1.08
CHL CUP PV REG GRAD DIAS: 6 mmHg
CHL CUP RV SYS PRESS: 66 mmHg
CHL CUP TV REG PEAK VELOCITY: 381 cm/s
E decel time: 250 msec
E/e' ratio: 14.68
FS: 35 % (ref 28–44)
Height: 69 in
IVS/LV PW RATIO, ED: 0.9
LA ID, A-P, ES: 32 mm
LA diam end sys: 32 mm
LA vol index: 43.8 mL/m2
LADIAMINDEX: 1.55 cm/m2
LAVOL: 90.3 mL
LAVOLA4C: 87.1 mL
LDCA: 3.46 cm2
LV E/e' medial: 14.68
LV E/e'average: 14.68
LV TDI E'MEDIAL: 5.11
LVELAT: 8.38 cm/s
LVOT SV: 57 mL
LVOT VTI: 16.5 cm
LVOT diameter: 21 mm
LVOTPV: 89.6 cm/s
LVOTVTI: 0.31 cm
Lateral S' vel: 9.14 cm/s
MV Dec: 250
MVPG: 6 mmHg
MVPKEVEL: 123 m/s
P 1/2 time: 812 ms
PV Reg vel dias: 126 cm/s
PW: 11.2 mm — AB (ref 0.6–1.1)
RV TAPSE: 12.3 mm
TDI e' lateral: 8.38
TRMAXVEL: 381 cm/s
VTI: 112 cm
VTI: 53.1 cm
WEIGHTICAEL: 3030 [oz_av]

## 2016-04-04 LAB — PREPARE PLATELET PHERESIS
UNIT DIVISION: 0
Unit division: 0
Unit division: 0

## 2016-04-04 LAB — CBC WITH DIFFERENTIAL/PLATELET
BASOS ABS: 0 10*3/uL (ref 0.0–0.1)
Basophils Relative: 0 %
EOS ABS: 0 10*3/uL (ref 0.0–0.7)
Eosinophils Relative: 1 %
HEMATOCRIT: 30.7 % — AB (ref 39.0–52.0)
HEMOGLOBIN: 10.7 g/dL — AB (ref 13.0–17.0)
LYMPHS PCT: 13 %
Lymphs Abs: 0.5 10*3/uL — ABNORMAL LOW (ref 0.7–4.0)
MCH: 35.5 pg — ABNORMAL HIGH (ref 26.0–34.0)
MCHC: 34.9 g/dL (ref 30.0–36.0)
MCV: 102 fL — ABNORMAL HIGH (ref 78.0–100.0)
MONOS PCT: 4 %
Monocytes Absolute: 0.2 10*3/uL (ref 0.1–1.0)
NEUTROS PCT: 82 %
Neutro Abs: 3.1 10*3/uL (ref 1.7–7.7)
Platelets: 44 10*3/uL — ABNORMAL LOW (ref 150–400)
RBC: 3.01 MIL/uL — AB (ref 4.22–5.81)
RDW: 12.6 % (ref 11.5–15.5)
WBC MORPHOLOGY: INCREASED
WBC: 3.8 10*3/uL — AB (ref 4.0–10.5)

## 2016-04-04 LAB — GLUCOSE, CAPILLARY
GLUCOSE-CAPILLARY: 121 mg/dL — AB (ref 65–99)
GLUCOSE-CAPILLARY: 117 mg/dL — AB (ref 65–99)
GLUCOSE-CAPILLARY: 145 mg/dL — AB (ref 65–99)
Glucose-Capillary: 120 mg/dL — ABNORMAL HIGH (ref 65–99)

## 2016-04-04 LAB — COMPREHENSIVE METABOLIC PANEL
ALBUMIN: 1.6 g/dL — AB (ref 3.5–5.0)
ALK PHOS: 80 U/L (ref 38–126)
ALT: 12 U/L — AB (ref 17–63)
ANION GAP: 9 (ref 5–15)
AST: 15 U/L (ref 15–41)
BUN: 28 mg/dL — AB (ref 6–20)
CALCIUM: 7.9 mg/dL — AB (ref 8.9–10.3)
CO2: 25 mmol/L (ref 22–32)
Chloride: 98 mmol/L — ABNORMAL LOW (ref 101–111)
Creatinine, Ser: 0.87 mg/dL (ref 0.61–1.24)
GFR calc Af Amer: >60 mL/min (ref >60)
GFR calc non Af Amer: >60 mL/min (ref >60)
GLUCOSE: 105 mg/dL — AB (ref 65–99)
Potassium: 4.1 mmol/L (ref 3.5–5.1)
SODIUM: 132 mmol/L — AB (ref 135–145)
Total Bilirubin: 1.2 mg/dL (ref 0.3–1.2)
Total Protein: 4.2 g/dL — ABNORMAL LOW (ref 6.5–8.1)

## 2016-04-04 LAB — PROTIME-INR
INR: 1.08
Prothrombin Time: 14 seconds (ref 11.4–15.2)

## 2016-04-04 LAB — VANCOMYCIN, TROUGH: VANCOMYCIN TR: 21 ug/mL — AB (ref 15–20)

## 2016-04-04 MED ORDER — VANCOMYCIN HCL 10 G IV SOLR
1250.0000 mg | INTRAVENOUS | Status: DC
  Administered 2016-04-05: 1250 mg via INTRAVENOUS
  Filled 2016-04-04 (×2): qty 1250

## 2016-04-04 NOTE — Progress Notes (Addendum)
Pharmacy Antibiotic Note  [name] is a 82 y.o. male admitted on 03/25/2016 with brain lesions.  Pharmacy has now been consulted for vancomycin for MRSA bacteremia and possible meningitis.  Admitted with brain lesions/metastases and seizures with plans for possible radiotherapy Also with chronic PE, IVC filter placed on 10/10 as he is not a candidate for anticoagulation  Pt with CrCl ~55, AF, WBC 3.8, Pltc 22>44 after 1 unit of platelets infused 10/13, MRSA +  On BCID from blood cultures collected 10/11 and 10/12  Plan: Continue Vancomycin 750 mg iv Q 12 Vancomycin trough prior to dose due 10/14 at 1800 F/u vanc trough, Tmax, WBC, PLTC, future blood cxrs, LOT   Height: 5\' 9"  (175.3 cm) Weight: 189 lb 6 oz (85.9 kg) IBW/kg (Calculated) : 70.7  Temp (24hrs), Avg:98.1 F (36.7 C), Min:97.4 F (36.3 C), Max:98.3 F (36.8 C)   Recent Labs Lab 03/31/16 2343 04/01/16 0045 04/01/16 1736 04/02/16 0322 04/02/16 0819 04/03/16 0544 04/04/16 0627  WBC  --  10.0 8.6 6.1  --  5.0 3.8*  CREATININE 1.04  --  1.26* 1.15  --  1.20 0.87  LATICACIDVEN  --   --  3.2*  --  2.0*  --   --     Estimated Creatinine Clearance: 68.7 mL/min (by C-G formula based on SCr of 0.87 mg/dL).    Allergies  Allergen Reactions  . Codeine Other (See Comments)    Blisters between fingers  . Docetaxel Rash     Antimicrobials this admission:  Vancomycin 10/11>> Rocephin 10/11>> 10/12 Ampicillin 10/11>>10/12  Dose adjustments this admission:  None  Microbiology results:  10/11 BCx: MRSA BCID 10/10 MRSA surgical PCR: positive  Thank you for allowing pharmacy to be a part of this patient's care.  Carlean Jews, Pharm.D. PGY1 Pharmacy Resident 10/14/20178:44 AM Pager [phone]

## 2016-04-04 NOTE — Progress Notes (Signed)
I called and spoke briefly with patient's daughter, [name].  Discussed that he will not have pacemaker removed.  Also expressed concern for his overall course.  She reports that she was upset last night as he was talking about dying which is something that he has never done before.  She believes "it was probably just the anesthesia talking."  She is going to call tomorrow morning to set up another time for a family meeting.  Micheline Rough, MD [place] Team [phone]

## 2016-04-04 NOTE — Progress Notes (Signed)
Echocardiogram 2D Echocardiogram has been performed.  [name] 04/04/2016, 2:44 PM

## 2016-04-04 NOTE — Progress Notes (Signed)
ANTIBIOTIC CONSULT NOTE   Pharmacy Consult for Vanco Indication: bacteremia  Allergies  Allergen Reactions  . Codeine Other (See Comments)    Blisters between fingers  . Docetaxel Rash    Patient Measurements: Height: 5\' 9"  (175.3 cm) Weight: 189 lb 6 oz (85.9 kg) IBW/kg (Calculated) : 70.7 Adjusted Body Weight:    Vital Signs: Temp: 98.4 F (36.9 C) (10/14 1457) Temp Source: Oral (10/14 1457) BP: 103/61 (10/14 1457) Pulse Rate: 61 (10/14 1457) Intake/Output from previous day: 10/13 0701 - 10/14 0700 In: 497 [I.V.:300; Blood:197] Out: 250 [Urine:250] Intake/Output from this shift: No intake/output data recorded.  Labs:  Recent Labs  04/02/16 0322 04/03/16 0544 04/03/16 1451 04/04/16 0627  WBC 6.1 5.0  --  3.8*  HGB 12.7* 11.5*  --  10.7*  PLT 33* 22* 44* 44*  CREATININE 1.15 1.20  --  0.87   Estimated Creatinine Clearance: 68.7 mL/min (by C-G formula based on SCr of 0.87 mg/dL).  Recent Labs  04/04/16 1704  [place] 21*     Microbiology:   Medical History: Past Medical History:  Diagnosis Date  . Allergy   . Arthritis    degenerative-knees  . Atrial fibrillation ([place]) 1990  . Bladder cancer ([place]) 2010  . Cancer ([place]) 05/05/07-06/15/07   parotid gland/66.4 GY  . Cancer ([place])    left eyelid   . Chronic kidney disease    positive cytology  . Coronary artery disease   . Diabetes mellitus without complication ([place])    meds d/c  2-3 year ago  . Dry eyes   . Dyslipidemia   . Dysrhythmia    HX A FIB  . GERD (gastroesophageal reflux disease)   . Heart murmur    2/6 systolic  . History of chemotherapy     Hx carboplatin/29fu  . History of radiation therapy 05/05/07-06/15/07   right parotid through subclavicular region/ mid jugularlymph node chain  . History of radiation therapy 05/05/07-06/15/07   Right parotid/hemineck,supraclavicular  . HOH (hard of hearing)   . Hypertension    labile  . Pacemaker    2003 vent paced , rate 69  .  Pneumonia    hx of   . Prostate hypertrophy   . Right groin hernia   . Skin cancer    HX BASAL CELL REMOVED  . Stroke [facility]) 2003   TIA  . Tinnitus    right   . Urinary tract infection    hx of   . Xerostomia    limited   Assessment:  ID: MRSA bacteremia, PPM infection +/- septic emboli, CNS infection. ID on board, removing lines/porta cath, plan for ECHO/TTE. Afeb, WBC 5>3.8 Central lines removed, not a candidate for pacemaker extraction per note  Antimicrobials this admission:  Vancomycin 10/11>> Rocephin 10/11>> 10/12 Ampicillin 10/11>>10/12  Dose adjustments this admission:  10/14: VT 21: Change to 1250mg  IV q24h  Microbiology results:  10/11 BCx: MRSA BCID 10/10 MRSA surgical PCR: positive  Goal of Therapy:  Vancomycin trough level 15-20 mcg/ml  Plan:  Change Vancomycin to 1250mg  IV q24h.   Ameris Akamine S. Alford Highland, PharmD, BCPS Clinical Staff Pharmacist Pager [phone]  Eilene Ghazi Stillinger 04/04/2016,7:14 PM

## 2016-04-04 NOTE — Progress Notes (Signed)
[facility] Progress Note  Patient: [name] [id number]   PCP: Gabriel Pao, MD DOB: [birth year]   DOA: 03/25/2016   DOS: 04/04/2016   Date of Service: the patient was seen and examined on 04/04/2016  Brief hospital course: [name] an 82 y.o.malewith H/o atrial fibrillation not on anticoagulation, pacemaker,mod AS, subdural hematoma, diet controlled diabetes, transitional cell carcinoma with intermittent hematuria, s/p chemotherapy, TIA, HTN was discharged from [facility] 9/13 to [facility] for rehab after long hospitalization for new onset seizures and possible brain metastasis noted on CT head, he was seen by Neurology, Neurosurgery and Oncology, couldn't have an MRI due to pacer. Finally plan was for FU with Dr.Shadad in Nov with plan for repeat CT with SRS protocol. Patient was discharged from [facility] on 9/13. Then readmitted with weakness and failure to thrive, noted to have AKI, creatinine of 1.9 from baseline of 1, NA was 126, K was 6.2 CT head 03/31/2016 shows necrotic right frontal mass 2 cm, left occipital mass 12 mm. Neurology, Rad Onc and Oncology following, unable to have MRI. Discussed again on tumor board 10/9 Palliative consulted 10/9: pleuritic chest pain-CTA chest with acute on chronic PE-not anticoagulation candidate 10/10: IVC filter 10/11 developing acute encephalopathy, workup shows MRSA sepsis Currently further plan is continue IV antibiotics.  Assessment and Plan: 1. MRSA bacteremia  Sepsis associated with bacteremia. Multifocal nodular lung opacities on CT chest, septic emboli  Patient presents with complaints of weakness and failure to thrive. Recently admitted for new onset seizures as well as brain lesions suspicious for metastasis. Patient developed fever during the hospitalization on 03/31/2016 with acute encephalopathy. Workup showed positive blood cultures for MRSA. There is a potential that the brain lesions are also  septic emboli although neurology does not feel that the lesion appears to be abscess. Unable to obtain MRI due to patient's pacemaker wire not compatible with MRI. Likely source of infection is central lines with Port-A-Cath as well as right IJ catheter.  At present patient is on IV vancomycin. Consulted interventional radiology to remove both of central line. Removed on 04/03/2016. Infectious diseases consulted. MRSA likely also causing possible meningitis. Pharmacy and infectious diseases monitoring the dosing. Not a candidate for pacemaker extraction, appreciate EP consult.  2. DVT Acute on chronic. Lower extremity Dopplers are positive for DVT Not a candidate for anticoagulation due to thrombocytopenia with brain lesions. Underwent IVC filter placement on 03/31/2016. Continue to monitor. For left upper extremity swelling of his check vascular ultrasound to rule out DVT that as well.  3. Metastatic brain lesions Seizure disorder. Initially diagnosed in September 2017. Also the blood seizures and discharged on Depakote. Due to thrombocytopenia and Depakote was changed to Keppra. Repeat EEG negative for any ongoing seizures and therefore continue with 500 mg Keppra per neurology. Continue Decadron for CNS lesions. Oncology as well as radiation oncology both following the patient. With ongoing MRSA sepsis, currently planned [facility] treatment for patient is on hold. There is also a potential that the patient may not need SRS since there is a potential that the brain lesions could be associated with septic emboli rather than metastatic cancer.  4. Thrombocytopenia. Initially was given heparin for DVT prophylaxis which has been on hold. HIT panel negative. Thought to be secondary to Depakote and despite changing to [place] continues to have thrombocytopenia. At present my discussion with oncology it appears that this is likely associated with sepsis. With invasive procedures patient is at  high risk for  bleeding. Discussed with oncology and patient can be transfused when necessary for platelets without any contraindication. Discussed with oncology as well as interventional radiology today regarding platelets and patient was given 1 unit of platelet transfusion prior to procedure.  5. Type 2 diabetes mellitus. Steroid-induced hyperglycemia. Continue Lantus continue sliding scale insulin.  6. Constipation. Continue stool softener at present.  7. Hyponatremia. Likely due to poor oral intake. Improving with IV hydration. Continue to closely monitor.  8. Hyperkalemia. Continue monitoring. Received Kayexalate.  9. Protein calorie malnutrition. In the setting of ongoing sepsis as well as poor oral intake. Dietary consulted.  Pain management: Scheduled Tylenol as well as when necessary Tylenol Activity: Consulted physical therapy Bowel regimen: last BM 04/01/2016 Diet: Cardiac diet aspiration precautions DVT Prophylaxis: mechanical compression device.  Advance goals of care discussion: Full code palliative care involved. Prognosis is guarded at this point.  Family Communication: no family was present at bedside, at the time of interview.  Disposition:  Discharge to likely SNF.   Consultants: Palliative care, infectious disease, cardiology Procedures: Echocardiogram  Antibiotics: Anti-infectives    Start     Dose/Rate Route Frequency Ordered Stop   04/05/16 1200  vancomycin (VANCOCIN) 1,250 mg in sodium chloride 0.9 % 250 mL IVPB     1,250 mg 166.7 mL/hr over 90 Minutes Intravenous Every 24 hours 04/04/16 1913     04/01/16 1800  cefTRIAXone (ROCEPHIN) 2 g in dextrose 5 % 50 mL IVPB  Status:  Discontinued     2 g 100 mL/hr over 30 Minutes Intravenous Every 12 hours 04/01/16 1717 04/02/16 0810   04/01/16 1800  ampicillin (OMNIPEN) 2 g in sodium chloride 0.9 % 50 mL IVPB  Status:  Discontinued     2 g 150 mL/hr over 20 Minutes Intravenous Every 4 hours 04/01/16  1717 04/02/16 0810   04/01/16 1800  vancomycin (VANCOCIN) IVPB 750 mg/150 ml premix  Status:  Discontinued     750 mg 150 mL/hr over 60 Minutes Intravenous Every 12 hours 04/01/16 1731 04/04/16 1912   04/01/16 1730  vancomycin (VANCOCIN) IVPB 1000 mg/200 mL premix  Status:  Discontinued     1,000 mg 200 mL/hr over 60 Minutes Intravenous  Once 04/01/16 1717 04/01/16 1722     Subjective: Complains about pain in upper epigastric as well as chest region. Nausea and vomiting. No fever no chills.  Objective: Physical Exam: Vitals:   04/03/16 2113 04/04/16 0527 04/04/16 1457 04/04/16 1959  BP: 112/62 106/61 103/61 116/62  Pulse: 61 (!) 59 61 70  Resp: 19 18 16 19   Temp: 98.3 F (36.8 C) 97.4 F (36.3 C) 98.4 F (36.9 C) 98.2 F (36.8 C)  TempSrc:   Oral   SpO2: 91% 95% 95% 97%  Weight:      Height:        Intake/Output Summary (Last 24 hours) at 04/04/16 2014 Last data filed at 04/04/16 1536  Gross per 24 hour  Intake              560 ml  Output              750 ml  Net             -190 ml   Filed Weights   03/25/16 1022 03/25/16 2036 03/30/16 0550  Weight: 86.6 kg (191 lb) 79.3 kg (174 lb 14.4 oz) 85.9 kg (189 lb 6 oz)    General: Alert, Awake and Oriented to Time, Place and Person. Appear in moderate  distress, affect appropriate Eyes: PERRL, Conjunctiva normal ENT: Oral Mucosa clear moist. Neck: NO JVD, NO Abnormal Mass Or lumps Cardiovascular: S1 and S2 Present, aortic systolic Murmur, Respiratory: Bilateral Air entry equal and Decreased, no use of accessory muscle, Clear to Auscultation, no Crackles, no wheezes Abdomen: Bowel Sound present, Soft and no tenderness Skin: no redness, diffuse Rash, no induration Extremities: no Pedal edema, no calf tenderness Neurologic: Grossly no focal neuro deficit. Bilaterally Equal motor strength  Data Reviewed: CBC:  Recent Labs Lab 04/01/16 0045 04/01/16 1736 04/02/16 0322 04/03/16 0544 04/03/16 1451 04/04/16 0627  WBC  10.0 8.6 6.1 5.0  --  3.8*  NEUTROABS  --  8.1* 5.7 4.2  --  3.1  HGB 14.3 13.6 12.7* 11.5*  --  10.7*  HCT 40.2 38.3* 35.3* 33.0*  --  30.7*  MCV 102.0* 101.9* 101.4* 101.2*  --  102.0*  PLT 53* 49* 33* 22* 44* 44*   Basic Metabolic Panel:  Recent Labs Lab 03/31/16 2343 04/01/16 1736  04/01/16 2258 04/02/16 0322 04/02/16 0720 04/03/16 0544 04/04/16 0627  NA 130* 127*  < > 127* 128* 130* 128* 132*  K 5.2* 5.5*  --   --  5.0  --  4.9 4.1  CL 89* 89*  --   --  91*  --  91* 98*  CO2 32 27  --   --  29  --  29 25  GLUCOSE 143* 212*  --   --  197*  --  284* 105*  BUN 25* 29*  --   --  27*  --  27* 28*  CREATININE 1.04 1.26*  --   --  1.15  --  1.20 0.87  CALCIUM 8.7* 8.7*  --   --  8.3*  --  8.4* 7.9*  MG  --   --   --   --  1.2*  --  1.7  --   < > = values in this interval not displayed.  Liver Function Tests:  Recent Labs Lab 04/01/16 1736 04/02/16 0322 04/03/16 0544 04/04/16 0627  AST 22 18 12* 15  ALT 18 17 14* 12*  ALKPHOS 96 89 81 80  BILITOT 0.9 0.7 0.8 1.2  PROT 5.2* 4.9* 4.5* 4.2*  ALBUMIN 2.2* 1.9* 1.6* 1.6*   No results for input(s): LIPASE, AMYLASE in the last 168 hours.  Recent Labs Lab 04/01/16 1736  AMMONIA 29   Coagulation Profile:  Recent Labs Lab 03/31/16 0558 04/02/16 0322 04/04/16 0627  INR 1.09 1.18 1.08   Cardiac Enzymes: No results for input(s): CKTOTAL, CKMB, CKMBINDEX, TROPONINI in the last 168 hours. BNP (last 3 results) No results for input(s): PROBNP in the last 8760 hours.  CBG:  Recent Labs Lab 04/03/16 1229 04/03/16 2034 04/04/16 0850 04/04/16 1229 04/04/16 1707  GLUCAP 236* 232* 121* 120* 117*    Studies: No results found.   Scheduled Meds: . acetaminophen  1,000 mg Oral Q8H  . dexamethasone  4 mg Oral Daily  . feeding supplement (GLUCERNA SHAKE)  237 mL Oral BID BM  . folic acid  1 mg Oral Daily  . insulin aspart  0-15 Units Subcutaneous TID WC  . insulin aspart  0-5 Units Subcutaneous QHS  . insulin  glargine  5 Units Subcutaneous QHS  . levETIRAcetam  500 mg Oral BID  . mouth rinse  15 mL Mouth Rinse BID  . pantoprazole  40 mg Oral Daily  . polyethylene glycol  17 g Oral BID  .  senna-docusate  2 tablet Oral QHS  . sodium chloride flush  3 mL Intravenous Q12H  . [START ON 04/05/2016] vancomycin  1,250 mg Intravenous Q24H  . vitamin B-12  1,000 mcg Oral Daily   Continuous Infusions:   PRN Meds: acetaminophen, ALPRAZolam, chlorhexidine, lidocaine, morphine injection, ondansetron **OR** ondansetron (ZOFRAN) IV, prochlorperazine, sodium chloride flush  Time spent: 30 minutes  Author: Berle Mull, MD [facility] Pager: [phone] 04/04/2016 8:14 PM  If 7PM-7AM, please contact night-coverage at [email or web address], password [facility]

## 2016-04-05 ENCOUNTER — Inpatient Hospital Stay (HOSPITAL_COMMUNITY): Payer: Medicare Other

## 2016-04-05 DIAGNOSIS — R609 Edema, unspecified: Secondary | ICD-10-CM

## 2016-04-05 DIAGNOSIS — C659 Malignant neoplasm of unspecified renal pelvis: Secondary | ICD-10-CM

## 2016-04-05 LAB — CBC WITH DIFFERENTIAL/PLATELET
BASOS PCT: 1 %
Basophils Absolute: 0 10*3/uL (ref 0.0–0.1)
EOS PCT: 0 %
Eosinophils Absolute: 0 10*3/uL (ref 0.0–0.7)
HCT: 34.1 % — ABNORMAL LOW (ref 39.0–52.0)
HEMOGLOBIN: 12.1 g/dL — AB (ref 13.0–17.0)
LYMPHS PCT: 11 %
Lymphs Abs: 0.4 10*3/uL — ABNORMAL LOW (ref 0.7–4.0)
MCH: 35.9 pg — AB (ref 26.0–34.0)
MCHC: 35.5 g/dL (ref 30.0–36.0)
MCV: 101.2 fL — AB (ref 78.0–100.0)
Monocytes Absolute: 0.1 10*3/uL (ref 0.1–1.0)
Monocytes Relative: 2 %
NEUTROS PCT: 86 %
Neutro Abs: 3.2 10*3/uL (ref 1.7–7.7)
Platelets: 43 10*3/uL — ABNORMAL LOW (ref 150–400)
RBC: 3.37 MIL/uL — AB (ref 4.22–5.81)
RDW: 12.8 % (ref 11.5–15.5)
WBC: 3.7 10*3/uL — AB (ref 4.0–10.5)

## 2016-04-05 LAB — BASIC METABOLIC PANEL
Anion gap: 10 (ref 5–15)
BUN: 31 mg/dL — AB (ref 6–20)
CALCIUM: 8 mg/dL — AB (ref 8.9–10.3)
CHLORIDE: 97 mmol/L — AB (ref 101–111)
CO2: 26 mmol/L (ref 22–32)
CREATININE: 0.92 mg/dL (ref 0.61–1.24)
GFR calc Af Amer: >60 mL/min (ref >60)
Glucose, Bld: 231 mg/dL — ABNORMAL HIGH (ref 65–99)
Potassium: 4 mmol/L (ref 3.5–5.1)
SODIUM: 133 mmol/L — AB (ref 135–145)

## 2016-04-05 LAB — URINALYSIS W MICROSCOPIC (NOT AT ARMC)
Glucose, UA: 500 mg/dL — AB
KETONES UR: 40 mg/dL — AB
Nitrite: POSITIVE — AB
PH: 5 (ref 5.0–8.0)
Protein, ur: 100 mg/dL — AB
Specific Gravity, Urine: 1.024 (ref 1.005–1.030)

## 2016-04-05 LAB — GLUCOSE, CAPILLARY
GLUCOSE-CAPILLARY: 217 mg/dL — AB (ref 65–99)
GLUCOSE-CAPILLARY: 222 mg/dL — AB (ref 65–99)
Glucose-Capillary: 243 mg/dL — ABNORMAL HIGH (ref 65–99)
Glucose-Capillary: 271 mg/dL — ABNORMAL HIGH (ref 65–99)

## 2016-04-05 MED ORDER — SACCHAROMYCES BOULARDII 250 MG PO CAPS
250.0000 mg | ORAL_CAPSULE | Freq: Two times a day (BID) | ORAL | Status: DC
  Administered 2016-04-05 – 2016-04-08 (×7): 250 mg via ORAL
  Filled 2016-04-05 (×8): qty 1

## 2016-04-05 MED ORDER — POLYETHYLENE GLYCOL 3350 17 G PO PACK: 17.0000 g | PACK | Freq: Every day | ORAL | Status: DC | PRN

## 2016-04-05 NOTE — Progress Notes (Signed)
Daily Progress Note   Patient Name: [name]       Date: 04/05/2016 DOB: 11-06-1929  Age: 82 y.o. MRN#: [id number] Attending Physician: Lavina Hamman, MD Primary Care Physician: [name], MD Admit Date: 03/25/2016  Reason for Consultation/Follow-up: Establishing goals of care  Subjective: Met today with patient and his daughter.  His daughter reports that she has appreciates the care that her father has been receiving. She reports that they are invested in continuing with current level of care. She also states that she and her father discussed a little bit yesterday regarding his wishes at the end of his life. Based upon this conversation, she is in agreement now that her father would want to be DO NOT RESUSCITATE. I discussed changing CODE STATUS with patient and his daughter. They are in agreement with this.  She understands that we are still facing multiple comorbid conditions that are not "fixable" problems. At the same time, however, patient and his daughter invested in continuing to try to treat any treatable conditions and see how his course [name] next several days.  Length of Stay: 10  Current Medications: Scheduled Meds:  . acetaminophen  1,000 mg Oral Q8H  . dexamethasone  4 mg Oral Daily  . feeding supplement (GLUCERNA SHAKE)  237 mL Oral BID BM  . folic acid  1 mg Oral Daily  . insulin aspart  0-15 Units Subcutaneous TID WC  . insulin aspart  0-5 Units Subcutaneous QHS  . insulin glargine  5 Units Subcutaneous QHS  . levETIRAcetam  500 mg Oral BID  . mouth rinse  15 mL Mouth Rinse BID  . pantoprazole  40 mg Oral Daily  . saccharomyces boulardii  250 mg Oral BID  . sodium chloride flush  3 mL Intravenous Q12H  . vancomycin  1,250 mg Intravenous Q24H  .  vitamin B-12  1,000 mcg Oral Daily    Continuous Infusions:    PRN Meds: acetaminophen, ALPRAZolam, chlorhexidine, lidocaine, morphine injection, ondansetron **OR** ondansetron (ZOFRAN) IV, polyethylene glycol, prochlorperazine, sodium chloride flush  Physical Exam       Well developed elderly male, sitting in bed.  Very hard of hearing.  Grimacing as if in pain CV rrr difficult to hear.  Port in right chest Resp CTA, No increased work of breathing Ext.  Able to move all 4.  No edema, left hand appears bluish - worse in finger tips. Skin dry, multiple bruises.       Vital Signs: BP 106/64 (BP Location: Left Arm)   Pulse (!) 59   Temp 97.5 F (36.4 C) (Oral)   Resp 16   Ht 5' 9"  (6.144 m)   Wt 85.9 kg (189 lb 6 oz)   SpO2 97%   BMI 27.97 kg/m  SpO2: SpO2: 97 % O2 Device: O2 Device: Not Delivered O2 Flow Rate: O2 Flow Rate (L/min): 2 L/min  Intake/output summary:   Intake/Output Summary (Last 24 hours) at 04/05/16 1948 Last data filed at 04/05/16 1639  Gross per 24 hour  Intake              713 ml  Output              700 ml  Net               13 ml   LBM: Last BM Date: 04/05/16 Baseline Weight: Weight: 86.6 kg (191 lb) Most recent weight: Weight: 85.9 kg (189 lb 6 oz)       Palliative Assessment/Data:    Flowsheet Rows   Flowsheet Row Most Recent Value  Intake Tab  Referral Department  Hospitalist  Unit at Time of Referral  Other (Comment)  Palliative Care Primary Diagnosis  Cancer  Date Notified  03/30/16  Palliative Care Type  New Palliative care  Reason for referral  Clarify Goals of Care, Counsel Regarding Hospice  Date of Admission  03/25/16  Date first seen by Palliative Care  03/30/16  # of days Palliative referral response time  0 Day(s)  # of days IP prior to Palliative referral  5  Clinical Assessment  Palliative Performance Scale Score  30%  Psychosocial & Spiritual Assessment  Palliative Care Outcomes  Patient/Family meeting held?  Yes    Who was at the meeting?  [name] and 1 other dtr, 2 grandsons  Palliative Care Outcomes  Improved pain interventions, Other (Comment)      Patient Active Problem List   Diagnosis Date Noted  . MRSA bacteremia 04/02/2016  . PE (pulmonary thromboembolism) ([place])   . Acute encephalopathy   . Mass   . Catheter-related bloodstream infection   . Staphylococcus aureus bacteremia with sepsis ([place])   . DIC (disseminated intravascular coagulation) ([place])   . Brain metastases ([place])   . Goals of care, counseling/discussion   . Acute on chronic alteration in mental status   . Palliative care encounter   . DNR (do not resuscitate) discussion   . Hyperglycemia 03/25/2016  . Brain lesion 03/25/2016  . AKI (acute kidney injury) ([place]) 03/25/2016  . Hyperkalemia 03/25/2016  . Hydronephrosis 03/25/2016  . Dehydration   . Hydronephrosis due to obstruction of ureter   . Elevated troponin   . Seizure ([place])   . Abnormal brain CT 03/03/2016  . Hypokalemia   . Seizures ([place]) 02/29/2016  . Syncope 02/28/2016  . Port catheter in place 02/21/2016  . Neutropenic fever ([place])   . Malnutrition of moderate degree 08/28/2015  . Diet-controlled diabetes mellitus ([place])   . Coronary artery disease 08/27/2015  . Thrombocytopenia ([place]) 08/27/2015  . Hematuria 08/27/2015  . Pancytopenia ([place]) 08/27/2015  . Sepsis ([place])   . 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 Chest pain 07/17/2014  . Aortic stenosis 04/29/2013  . Thrombocytosis ([place]) 03/30/2013  . Abnormal urine cytology 05/02/2012  . Acute  pulmonary edema ([place]) 10/04/2011  . Alcohol withdrawal delirium ([place]) 10/04/2011  . TIA (transient ischemic attack) 10/03/2011  . Subdural hematoma ([place]) 10/02/2011  . ETOH abuse 10/02/2011  . Permanent atrial fibrillation ([place]) 10/02/2011  . UTI (lower urinary tract infection) 10/02/2011  . Elevated PSA 05/19/2011  . Pacemaker 05/19/2011  . Diabetes mellitus ([place]) 05/19/2011  . Hypertension 05/19/2011  .  Dyslipidemia 05/19/2011    Palliative Care Assessment & Plan   Patient Profile: 82 y.o. 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 He was recently hospitalized from 9/8-9/13 for new onset-seizures and found to have a left occipital lobe mass and a right frontal lobe mass on CT head. He is unable to have MRI due to his pacemaker leads.  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  He developed chest pain and was found to have bilateral multiple PEs.  He is not a candidate for anticoagulation due to SDH and thrombocytopenia.  An IVC filter was placed on 10/10.   He is not a candidate for anticoagulation.  The next step is a one time SRS procedure thru radiation oncology that will palliate the brain mets, the primary cancer will still likely take his life at some point.  Assessment: Currently patient is hemodynamically stable but appears uncomfortable.  CBGs have been low.  He will continue to have difficulty with clotting.  Recommendations/Plan: We discussed that in light of multiple chronic medical problems that have worsened with this acute problem, care should be focused on interventions that are likely to allow the patient to achieve goal of getting back to home and spending time with family. I discussed with his daughter regarding heroic interventions at the end-of-life and she agrees this would not be in line with prior expressed wishes for a natural death or be likely to lead to getting well enough to go back home. Family in agreement with changing CODE STATUS to DO NOT RESUSCITATE.  Otherwise, continue current care.  Goals of Care and Additional Recommendations:  Limitations on Scope  of Treatment: Full Scope Treatment  Code Status:  DNR  Prognosis:   Unable to determine  Discharge Planning:  [facility] for rehab with Palliative care service follow-up  Care plan was discussed with Dr. Posey Pronto and daughter [name] as well as bedside RN.  Thank you for allowing the Palliative Medicine Team to assist in the care of this patient.   Time In: 13:30 Time Out: 1415 Total Time 45 min Prolonged Time Billed no      Greater than 50%  of this time was spent counseling and coordinating care related to the above assessment and plan.  Micheline Rough, MD [place] Team [phone]   Please contact Palliative MedicineTeam phone at [phone] for questions and concerns.   Please see AMION for individual provider pager numbers.

## 2016-04-05 NOTE — Progress Notes (Signed)
[facility] Progress Note  Patient: [name] [id number]   PCP: [name], MD DOB: June 15, 1930   DOA: 03/25/2016   DOS: 04/05/2016   Date of Service: the patient was seen and examined on 04/05/2016  Brief hospital course: [name] an 82 y.o.malewith H/o atrial fibrillation not on anticoagulation, pacemaker,mod AS, subdural hematoma, diet controlled diabetes, transitional cell carcinoma with intermittent hematuria, s/p chemotherapy, TIA, HTN was discharged from [facility] 9/13 to [facility] for rehab after long hospitalization for new onset seizures and possible brain metastasis noted on CT head, he was seen by Neurology, Neurosurgery and Oncology, couldn't have an MRI due to pacer. Finally plan was for FU with Dr.Shadad in Nov with plan for repeat CT with SRS protocol. Patient was discharged from [facility] on 9/13. Then readmitted with weakness and failure to thrive, noted to have AKI, creatinine of 1.9 from baseline of 1, NA was 126, K was 6.2 CT head 03/31/2016 shows necrotic right frontal mass 2 cm, left occipital mass 12 mm. Neurology, Rad Onc and Oncology following, unable to have MRI. Discussed again on tumor board 10/9 Palliative consulted 10/9: pleuritic chest pain-CTA chest with acute on chronic PE-not anticoagulation candidate 10/10: IVC filter 10/11 developing acute encephalopathy, workup shows MRSA sepsis 10/12  CODE STATUS changed to DO NOT RESUSCITATE after discussion with patient's daughter Currently further plan is continue IV antibiotics.  Assessment and Plan: 1. MRSA bacteremia  Sepsis associated with bacteremia. Multifocal nodular lung opacities on CT chest, septic emboli  Patient presents with complaints of weakness and failure to thrive. Recently admitted for new onset seizures as well as brain lesions suspicious for metastasis. Patient developed fever during the hospitalization on 03/31/2016 with acute encephalopathy. Workup showed  positive blood cultures for MRSA. There is a potential that the brain lesions are also septic emboli although neurology does not feel that the lesion appears to be abscess. Unable to obtain MRI due to patient's pacemaker wire not compatible with MRI. Likely source of infection is central lines with Port-A-Cath as well as right IJ catheter.  At present patient is on IV vancomycin. Consulted interventional radiology to remove both of central line. Removed on 04/04/2016. Infectious diseases consulted. MRSA likely also causing possible meningitis. Pharmacy and infectious diseases monitoring the dosing. Not a candidate for pacemaker extraction, appreciate EP consult. Repeat culture performed today on 04/05/2016.  2. DVT Acute on chronic. Lower extremity Dopplers are positive for DVT Not a candidate for anticoagulation due to thrombocytopenia with brain lesions. Underwent IVC filter placement on 03/31/2016. Continue to monitor. For left upper extremity swelling of his check vascular ultrasound to rule out DVT that as well.  3. Metastatic brain lesions Seizure disorder. mental status remaining stable.  Initially diagnosed in September 2017. Also the blood seizures and discharged on Depakote. Due to thrombocytopenia and Depakote was changed to Keppra. Repeat EEG negative for any ongoing seizures and therefore continue with 500 mg Keppra per neurology. Continue Decadron for CNS lesions. Oncology as well as radiation oncology both following the patient. With ongoing MRSA sepsis, currently planned [facility] treatment for patient is on hold. There is also a potential that the patient may not need SRS since there is a potential that the brain lesions could be associated with septic emboli rather than metastatic cancer.  4. Thrombocytopenia. Initially was given heparin for DVT prophylaxis which has been on hold. HIT panel negative. Thought to be secondary to Depakote and despite changing to [place]  continues to have  thrombocytopenia. At present my discussion with oncology it appears that this is likely associated with sepsis. With invasive procedures patient is at high risk for bleeding. Discussed with oncology and patient can be transfused when necessary for platelets without any contraindication. S/P 1 platelet transfusion on 04/04/2016 for procedure.  5. Type 2 diabetes mellitus. Steroid-induced hyperglycemia. Continue Lantus continue sliding scale insulin.  6. Constipaton, currently diarrhea Currently resolved, changed to Zofran when necessary.  7. Hyponatremia. Likely due to poor oral intake. Improving with IV hydration. Continue to closely monitor.  8. Hyperkalemia. Resolved Continue monitoring. Received Kayexalate.  9. Protein calorie malnutrition. In the setting of ongoing sepsis as well as poor oral intake. Dietary consulted.  10. Goals of care discussion. Palliative care engaging with patient's daughter. After discussion with patient's daughter on 04/05/2016 patient's code status is changed to DO NOT RESUSCITATE as per request from patient's daughter.  Pain management: Scheduled Tylenol as well as when necessary Tylenol Activity: Consulted physical therapy Bowel regimen: last BM 04/05/2016 Diet: Cardiac diet aspiration precautions DVT Prophylaxis: mechanical compression device.  Advance goals of care discussion: Full code palliative care involved. Prognosis is guarded at this point.  Family Communication: family was present at bedside, at the time of interview.  Disposition:  Discharge to likely SNF.   Consultants: Palliative care, infectious disease, cardiology Procedures: Echocardiogram  Antibiotics: Anti-infectives    Start     Dose/Rate Route Frequency Ordered Stop   04/05/16 1200  vancomycin (VANCOCIN) 1,250 mg in sodium chloride 0.9 % 250 mL IVPB     1,250 mg 166.7 mL/hr over 90 Minutes Intravenous Every 24 hours 04/04/16 1913     04/01/16 1800   cefTRIAXone (ROCEPHIN) 2 g in dextrose 5 % 50 mL IVPB  Status:  Discontinued     2 g 100 mL/hr over 30 Minutes Intravenous Every 12 hours 04/01/16 1717 04/02/16 0810   04/01/16 1800  ampicillin (OMNIPEN) 2 g in sodium chloride 0.9 % 50 mL IVPB  Status:  Discontinued     2 g 150 mL/hr over 20 Minutes Intravenous Every 4 hours 04/01/16 1717 04/02/16 0810   04/01/16 1800  vancomycin (VANCOCIN) IVPB 750 mg/150 ml premix  Status:  Discontinued     750 mg 150 mL/hr over 60 Minutes Intravenous Every 12 hours 04/01/16 1731 04/04/16 1912   04/01/16 1730  vancomycin (VANCOCIN) IVPB 1000 mg/200 mL premix  Status:  Discontinued     1,000 mg 200 mL/hr over 60 Minutes Intravenous  Once 04/01/16 1717 04/01/16 1722     Subjective: No acute complaints: No acute events. Did have incontinence of stool this morning. Daughter reports urine is darker.   Objective: Physical Exam: Vitals:   04/04/16 1457 04/04/16 1959 04/05/16 0449 04/05/16 1233  BP: 103/61 116/62 111/61 106/64  Pulse: 61 70 (!) 59 (!) 59  Resp: 16 19 19 16   Temp: 98.4 F (36.9 C) 98.2 F (36.8 C) 98.2 F (36.8 C) 97.5 F (36.4 C)  TempSrc: Oral   Oral  SpO2: 95% 97% 99% 97%  Weight:      Height:        Intake/Output Summary (Last 24 hours) at 04/05/16 1620 Last data filed at 04/05/16 1341  Gross per 24 hour  Intake              403 ml  Output              700 ml  Net             -  297 ml   Filed Weights   03/25/16 1022 03/25/16 2036 03/30/16 0550  Weight: 86.6 kg (191 lb) 79.3 kg (174 lb 14.4 oz) 85.9 kg (189 lb 6 oz)    General: Alert, Awake and Oriented to Time, Place and Person. Appear in moderate distress, affect appropriate Eyes: PERRL, Conjunctiva normal ENT: Oral Mucosa clear moist. Neck: NO JVD, NO Abnormal Mass Or lumps Cardiovascular: S1 and S2 Present, aortic systolic Murmur, Respiratory: Bilateral Air entry equal and Decreased, no use of accessory muscle, Clear to Auscultation, no Crackles, no  wheezes Abdomen: Bowel Sound present, Soft and no tenderness Skin: no redness, diffuse Rash, no induration Extremities: no Pedal edema, no calf tenderness Neurologic: Grossly no focal neuro deficit. Bilaterally Equal motor strength  Data Reviewed: CBC:  Recent Labs Lab 04/01/16 1736 04/02/16 0322 04/03/16 0544 04/03/16 1451 04/04/16 0627 04/05/16 0647  WBC 8.6 6.1 5.0  --  3.8* 3.7*  NEUTROABS 8.1* 5.7 4.2  --  3.1 3.2  HGB 13.6 12.7* 11.5*  --  10.7* 12.1*  HCT 38.3* 35.3* 33.0*  --  30.7* 34.1*  MCV 101.9* 101.4* 101.2*  --  102.0* 101.2*  PLT 49* 33* 22* 44* 44* 43*   Basic Metabolic Panel:  Recent Labs Lab 04/01/16 1736  04/02/16 0322 04/02/16 0720 04/03/16 0544 04/04/16 0627 04/05/16 0647  NA 127*  < > 128* 130* 128* 132* 133*  K 5.5*  --  5.0  --  4.9 4.1 4.0  CL 89*  --  91*  --  91* 98* 97*  CO2 27  --  29  --  29 25 26   GLUCOSE 212*  --  197*  --  284* 105* 231*  BUN 29*  --  27*  --  27* 28* 31*  CREATININE 1.26*  --  1.15  --  1.20 0.87 0.92  CALCIUM 8.7*  --  8.3*  --  8.4* 7.9* 8.0*  MG  --   --  1.2*  --  1.7  --   --   < > = values in this interval not displayed.  Liver Function Tests:  Recent Labs Lab 04/01/16 1736 04/02/16 0322 04/03/16 0544 04/04/16 0627  AST 22 18 12* 15  ALT 18 17 14* 12*  ALKPHOS 96 89 81 80  BILITOT 0.9 0.7 0.8 1.2  PROT 5.2* 4.9* 4.5* 4.2*  ALBUMIN 2.2* 1.9* 1.6* 1.6*   No results for input(s): LIPASE, AMYLASE in the last 168 hours.  Recent Labs Lab 04/01/16 1736  AMMONIA 29   Coagulation Profile:  Recent Labs Lab 03/31/16 0558 04/02/16 0322 04/04/16 0627  INR 1.09 1.18 1.08   Cardiac Enzymes: No results for input(s): CKTOTAL, CKMB, CKMBINDEX, TROPONINI in the last 168 hours. BNP (last 3 results) No results for input(s): PROBNP in the last 8760 hours.  CBG:  Recent Labs Lab 04/04/16 1229 04/04/16 1707 04/04/16 2031 04/05/16 0828 04/05/16 1224  GLUCAP 120* 117* 145* 217* 243*     Studies: No results found.   Scheduled Meds: . acetaminophen  1,000 mg Oral Q8H  . dexamethasone  4 mg Oral Daily  . feeding supplement (GLUCERNA SHAKE)  237 mL Oral BID BM  . folic acid  1 mg Oral Daily  . insulin aspart  0-15 Units Subcutaneous TID WC  . insulin aspart  0-5 Units Subcutaneous QHS  . insulin glargine  5 Units Subcutaneous QHS  . levETIRAcetam  500 mg Oral BID  . mouth rinse  15 mL Mouth Rinse BID  .  pantoprazole  40 mg Oral Daily  . polyethylene glycol  17 g Oral BID  . senna-docusate  2 tablet Oral QHS  . sodium chloride flush  3 mL Intravenous Q12H  . vancomycin  1,250 mg Intravenous Q24H  . vitamin B-12  1,000 mcg Oral Daily   Continuous Infusions:   PRN Meds: acetaminophen, ALPRAZolam, chlorhexidine, lidocaine, morphine injection, ondansetron **OR** ondansetron (ZOFRAN) IV, prochlorperazine, sodium chloride flush  Time spent: 30 minutes  Author: Berle Mull, MD [facility] Pager: [phone] 04/05/2016 4:20 PM  If 7PM-7AM, please contact night-coverage at [email or web address], password [facility]

## 2016-04-05 NOTE — Progress Notes (Signed)
VASCULAR LAB PRELIMINARY  PRELIMINARY  PRELIMINARY  PRELIMINARY  Left upper extremity venous duplex completed.    Preliminary report:  There is acute DVT noted in the left axillary vein and acute superficial thrombosis noted in the left cephalic and basilic veins.    Gave report to [place], RN  Sevag Shearn, [place], RVT 04/05/2016, 6:06 PM

## 2016-04-06 ENCOUNTER — Encounter: Payer: Self-pay | Admitting: Radiation Oncology

## 2016-04-06 ENCOUNTER — Encounter (HOSPITAL_COMMUNITY): Payer: Self-pay | Admitting: Interventional Radiology

## 2016-04-06 DIAGNOSIS — Z950 Presence of cardiac pacemaker: Secondary | ICD-10-CM

## 2016-04-06 DIAGNOSIS — B9562 Methicillin resistant Staphylococcus aureus infection as the cause of diseases classified elsewhere: Secondary | ICD-10-CM

## 2016-04-06 DIAGNOSIS — Z9221 Personal history of antineoplastic chemotherapy: Secondary | ICD-10-CM

## 2016-04-06 DIAGNOSIS — T80211D Bloodstream infection due to central venous catheter, subsequent encounter: Secondary | ICD-10-CM

## 2016-04-06 DIAGNOSIS — R5381 Other malaise: Secondary | ICD-10-CM

## 2016-04-06 LAB — CBC WITH DIFFERENTIAL/PLATELET
BASOS ABS: 0 10*3/uL (ref 0.0–0.1)
BASOS PCT: 1 %
EOS ABS: 0 10*3/uL (ref 0.0–0.7)
Eosinophils Relative: 0 %
HCT: 32.5 % — ABNORMAL LOW (ref 39.0–52.0)
Hemoglobin: 11.5 g/dL — ABNORMAL LOW (ref 13.0–17.0)
LYMPHS ABS: 0.7 10*3/uL (ref 0.7–4.0)
LYMPHS PCT: 19 %
MCH: 35.5 pg — AB (ref 26.0–34.0)
MCHC: 35.4 g/dL (ref 30.0–36.0)
MCV: 100.3 fL — AB (ref 78.0–100.0)
MONO ABS: 0.1 10*3/uL (ref 0.1–1.0)
Monocytes Relative: 3 %
NEUTROS ABS: 3 10*3/uL (ref 1.7–7.7)
Neutrophils Relative %: 77 %
PLATELETS: 29 10*3/uL — AB (ref 150–400)
RBC: 3.24 MIL/uL — ABNORMAL LOW (ref 4.22–5.81)
RDW: 12.4 % (ref 11.5–15.5)
WBC Morphology: INCREASED
WBC: 3.8 10*3/uL — ABNORMAL LOW (ref 4.0–10.5)

## 2016-04-06 LAB — MAGNESIUM: MAGNESIUM: 1.6 mg/dL — AB (ref 1.7–2.4)

## 2016-04-06 LAB — GLUCOSE, CAPILLARY
GLUCOSE-CAPILLARY: 268 mg/dL — AB (ref 65–99)
Glucose-Capillary: 220 mg/dL — ABNORMAL HIGH (ref 65–99)
Glucose-Capillary: 273 mg/dL — ABNORMAL HIGH (ref 65–99)

## 2016-04-06 LAB — CATH TIP CULTURE

## 2016-04-06 MED ORDER — CEFAZOLIN SODIUM-DEXTROSE 2-4 GM/100ML-% IV SOLN
2.0000 g | Freq: Three times a day (TID) | INTRAVENOUS | Status: DC
Start: 2016-04-06 — End: 2016-04-06
  Filled 2016-04-06 (×2): qty 100

## 2016-04-06 MED ORDER — VANCOMYCIN HCL 10 G IV SOLR
1250.0000 mg | INTRAVENOUS | Status: DC
  Administered 2016-04-06 – 2016-04-07 (×2): 1250 mg via INTRAVENOUS
  Filled 2016-04-06 (×3): qty 1250

## 2016-04-06 MED ORDER — MAGNESIUM SULFATE 2 GM/50ML IV SOLN
2.0000 g | Freq: Once | INTRAVENOUS | Status: AC
  Administered 2016-04-06: 2 g via INTRAVENOUS
  Filled 2016-04-06: qty 50

## 2016-04-06 NOTE — Progress Notes (Signed)
CRITICAL VALUE ALERT  Critical value received:  Platelets 29  Date of notification:  04/06/16  Time of notification:  0614  Critical value read back:Yes.    Nurse who received alert:  Lubertha South  MD notified (1st page):  Shel[place]  Time of first page:  0620  Responding MD:  Carrie Mew  Time MD responded:  [phone] No new orders given at this time. Schorr,NP stated that there is no urgent need for a transfusion at this time.

## 2016-04-06 NOTE — Progress Notes (Signed)
IP PROGRESS NOTE  Subjective:   [name] continues to be more awake this morning. He was eating his breakfast without major issues. He does have upper extremity swelling with documented deep and thrombosis. He denied any bleeding complications at this time. Overall appeared comfortable. Port-A-Cath removed without complications.  Objective:  Vital signs in last 24 hours: Temp:  [97.5 F (36.4 C)-98.7 F (37.1 C)] 97.7 F (36.5 C) (10/16 0608) Pulse Rate:  [59-61] 61 (10/16 0608) Resp:  [16-18] 18 (10/16 0608) BP: (106-113)/(63-64) 111/64 (10/16 0608) SpO2:  [97 %-98 %] 98 % (10/16 [id number]) Weight change:  Last BM Date: 04/05/16  Intake/Output from previous day: 10/15 0701 - 10/16 0700 In: 710 [P.O.:460; IV Piggyback:250] Out: 1050 [Urine:1050]  Elderly frail gentleman. Not in any distress. Mouth: mucous membranes moist, pharynx normal without lesions no bleeding noted in his oropharynx. Resp: clear to auscultation bilaterally Cardio: regular rate and rhythm, S1, S2 normal, no murmur, click, rub or gallop GI: soft, non-tender; bowel sounds normal; no masses,  no organomegaly Extremities: extremities normal, atraumatic, no cyanosis or edema Skin: Ecchymosis noted on his upper extremities. No noted bilaterally. Neurological: No focal deficits noted. No bleeding around his right subclavian catheter.  Lab Results:  Recent Labs  04/05/16 0647 04/06/16 0546  WBC 3.7* 3.8*  HGB 12.1* 11.5*  HCT 34.1* 32.5*  PLT 43* 29*    BMET  Recent Labs  04/04/16 0627 04/05/16 0647  NA 132* 133*  K 4.1 4.0  CL 98* 97*  CO2 25 26  GLUCOSE 105* 231*  BUN 28* 31*  CREATININE 0.87 0.92  CALCIUM 7.9* 8.0*     Medications: I have reviewed the patient's current medications.  Assessment/Plan:     82 year old gentleman with the following issues:  1. 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 CT scan in January 2017 showed pelvic adenopathy indicating metastatic disease.  He is status post salvage chemotherapy utilizing carboplatin and gemcitabine with his last CT scan obtained in September 2017 showed no evidence of metastatic disease. He is not currently eligible for any systemic therapy given his severe debilitation and poor performance status.  2. Brain masses: Suspicious for metastatic disease although not confirmed. Treatment for those lesions will be in the form of radiation under the care of Dr. Tammi Klippel once he is clinically stable.  3. Delirium: Likely related to sepsis and bacteremia.proved at this time.  4. Thrombocytopenia: Related to sepsis without bleeding at this time. Platelet count appears stable but was transfused in preparation for   5. Prognosis: Continues to be poor given his debilitation and multiple health issues. Although he is recovering slowly and appears to be long way to go and unlikely he will make a full recovery. I agree with DO NOT RESUSCITATE status at this time. His prognosis was discussed with his daughter today over the phone.     LOS: 11 days   [id number] 04/06/2016, 11:04 AM

## 2016-04-06 NOTE — Progress Notes (Signed)
[name] for Infectious Disease    Date of Admission:  03/25/2016   Total days of antibiotics 6        Day 6 vanco           ID: [name] is a 82 y.o. 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 CT scan in January 2017 showed pelvic adenopathy indicating metastatic disease. He is status post salvage chemotherapy utilizing carboplatin and gemcitabine with his last CT scan obtained in September 2017 showed no evidence of metastatic disease. He is not currently eligible for any systemic therapy given his severe debilitation and poor performance status. He has head CT that was suspicious for metastatic disease for which they may due further radiation after abtx given. He was found to have MRSA bacteremia, porta cath removed, cx tip + as well. Has been afebrile, bacteremia cleared but has leukopenia, thrombocytopenia no active bleeds. 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 acetaminophen  1,000 mg Oral Q8H  . dexamethasone  4 mg Oral Daily  . feeding supplement (GLUCERNA SHAKE)  237 mL Oral BID BM  . folic acid  1 mg Oral Daily  . insulin aspart  0-15 Units Subcutaneous TID WC  . insulin aspart  0-5 Units Subcutaneous QHS  . insulin  glargine  5 Units Subcutaneous QHS  . levETIRAcetam  500 mg Oral BID  . magnesium sulfate 1 - 4 g bolus IVPB  2 g Intravenous Once  . mouth rinse  15 mL Mouth Rinse BID  . pantoprazole  40 mg Oral Daily  . saccharomyces boulardii  250 mg Oral BID  . sodium chloride flush  3 mL Intravenous Q12H  . vancomycin  1,250 mg Intravenous Q24H  . vitamin B-12  1,000 mcg Oral Daily    Objective: Vital signs in last 24 hours: Temp:  [97.7 F (36.5 C)-98.7 F (37.1 C)] 97.7 F (36.5 C) (10/16 7893) Pulse Rate:  [61] 61 (10/16 0608) Resp:  [16-18] 18 (10/16 0608) BP: (111-113)/(63-64) 111/64 (10/16 0608) SpO2:  [97 %-98 %] 98 % (10/16 8101)  Physical Exam  Constitutional: He is oriented to person, place, and time. He appears frail. No distress.  HENT:  Mouth/Throat: Oropharynx is clear and moist. No oropharyngeal exudate.  Cardiovascular: Normal rate, regular rhythm and normal heart sounds. Exam reveals no gallop and no friction rub.  No murmur heard.  Chest wall: no pain around pacemaker pocket Pulmonary/Chest: Effort normal and breath sounds normal. No respiratory distress. He has no wheezes.  Abdominal: Soft. Bowel sounds are normal. He exhibits no distension. There is no tenderness.  Lymphadenopathy:  He has no cervical adenopathy.  Neurological: He is alert and oriented to person, place, and time.  Ext: pain to his shoulders with  ROM but no warmth or fluctuance noted Skin: Skin is warm and dry. No rash noted. No erythema.    Lab Results  Recent Labs  04/04/16 0627 04/05/16 0647 04/06/16 0546  WBC 3.8* 3.7* 3.8*  HGB 10.7* 12.1* 11.5*  HCT 30.7* 34.1* 32.5*  NA 132* 133*  --   K 4.1 4.0  --   CL 98* 97*  --   CO2 25 26  --   BUN 28* 31*  --   CREATININE 0.87 0.92  --    Liver Panel  Recent Labs  04/04/16 0627  PROT 4.2*  ALBUMIN 1.6*  AST 15  ALT 12*  ALKPHOS 80  BILITOT 1.2   No results found for: [place], [place]  Microbiology: 10/15 blood cx  ngtd 10/13 cath tip MRSA 10/12 blood cx MRSA 10/11 blood cx MRSA Studies/Results: No results found.   Assessment/Plan: Complicated MRSA bacteremia with portacath involved, since removed. Still has retained pacemaker and questionable CNS lesions of embolic vs. Metastatic lesions.   - plan for 6 wk of IV vancomycin. Currently on day 6/42.then place on oral suppression for his retained pacemaker. TTE did not show evidence of vegetation though patient is poor candidate for TEE. Poor candidate for removal of device  - will need picc line, cane place on 10/17 if all blood cx ngtd at 48hr  Thrombocytopenia = will need close monitoring or for evidence of active bleeding  -encourage family meeting for goals of care, concern that 6 wk of IV therapy maybe difficult to complete given multiple co morbidities  Home health orders listed below  Diagnosis: mrsa bacteremia  Culture Result: MRSA  Allergies  Allergen Reactions  . Codeine Other (See Comments)    Blisters between fingers  . Docetaxel Rash    Discharge antibiotics: Per pharmacy protocol  Aim for Vancomycin trough 15-20 (unless otherwise indicated) Duration: 6 wk End Date: Nov 20th  [place] Per Protocol:  Labs weekly while on IV antibiotics: _x CBC with differential twice a week _x_ BMP twice a week __ CMP __ CRP __ ESR _x_ Vancomycin trough twice a week  _x_ Please pull PIC at completion of IV antibiotics __ Please leave PIC in place until doctor has seen patient or been notified  Fax weekly labs to [phone]  Clinic Follow Up Appt: 4-6 wk  @   [facility], [facility] for Infectious Diseases Cell: [phone] Pager: [phone]  04/06/2016, 12:59 PM

## 2016-04-06 NOTE — Progress Notes (Signed)
PT Cancellation Note  Patient Details Name: [name] MRN: [id number] DOB: [birth year]   Cancelled Treatment:    Reason Eval/Treat Not Completed: Medical issues which prohibited therapy.  Pt with new UE DVT's, no SVC filter, pt is poor condition otherwise.  Dr Posey Pronto asks to hold for today, will recheck for appropriateness to mobilize 10/17.   04/06/2016  Donnella Sham, PT [phone]  (pager)  [name], [name] 04/06/2016, 5:49 PM

## 2016-04-06 NOTE — Progress Notes (Signed)
[facility] Progress Note  Patient: [name] [id number]   PCP: [name], MD DOB: 1930/05/25   DOA: 03/25/2016   DOS: 04/06/2016   Date of Service: the patient was seen and examined on 04/06/2016  Brief hospital course: [name] an 82 y.o.malewith H/o atrial fibrillation not on anticoagulation, pacemaker,mod AS, subdural hematoma, diet controlled diabetes, transitional cell carcinoma with intermittent hematuria, s/p chemotherapy, TIA, HTN was discharged from [facility] 9/13 to [facility] for rehab after long hospitalization for new onset seizures and possible brain metastasis noted on CT head, he was seen by Neurology, Neurosurgery and Oncology, couldn't have an MRI due to pacer. Finally plan was for FU with Dr.Shadad in Nov with plan for repeat CT with SRS protocol. Patient was discharged from [facility] on 9/13. Then readmitted with weakness and failure to thrive, noted to have AKI, creatinine of 1.9 from baseline of 1, NA was 126, K was 6.2 CT head 03/31/2016 shows necrotic right frontal mass 2 cm, left occipital mass 12 mm. Neurology, Rad Onc and Oncology following, unable to have MRI. Discussed again on tumor board 10/9 Palliative consulted 10/9: pleuritic chest pain-CTA chest with acute on chronic PE-not anticoagulation candidate 10/10: IVC filter 10/11 developing acute encephalopathy, workup shows MRSA sepsis 10/12  CODE STATUS changed to DO NOT RESUSCITATE after discussion with patient's daughter Currently further plan is continue IV antibiotics.  Assessment and Plan: 1. MRSA bacteremia  Sepsis associated with bacteremia. Multifocal nodular lung opacities on CT chest, septic emboli  Patient presents with complaints of weakness and failure to thrive. Recently admitted for new onset seizures as well as brain lesions suspicious for metastasis. Patient developed fever during the hospitalization on 03/31/2016 with acute encephalopathy. Workup showed  positive blood cultures for MRSA. There is a potential that the brain lesions are also septic emboli although neurology does not feel that the lesion appears to be abscess. Unable to obtain MRI due to patient's pacemaker wire not compatible with MRI. Likely source of infection is central lines with Port-A-Cath as well as right IJ catheter.  At present patient is on IV vancomycin. Consulted interventional radiology to remove both of central line. Removed on 04/04/2016. Infectious diseases consulted. MRSA likely also causing possible meningitis. Pharmacy and infectious diseases monitoring the dosing. Not a candidate for pacemaker extraction, appreciate EP consult. Repeat culture performed on 04/05/2016. If negative can get a PICC line on 04/07/2016  2. DVT Acute on chronic. Lower extremity Dopplers are positive for DVT Not a candidate for anticoagulation due to thrombocytopenia with brain lesions. Underwent IVC filter placement on 03/31/2016. Continue to monitor. Patient has bilateral upper extremity swelling, left upper extremity Doppler is positive for DVT, he may likely very well have DVT in the right upper extremity. Discussed with interventional radiology, superior vena cava filter do not appear beneficial in this situation. We will discuss with family regarding this and further options.  3. Metastatic brain lesions Seizure disorder. mental status remaining stable.  Initially diagnosed in September 2017. Also the blood seizures and discharged on Depakote. Due to thrombocytopenia and Depakote was changed to Keppra. Repeat EEG negative for any ongoing seizures and therefore continue with 500 mg Keppra per neurology. Continue Decadron for CNS lesions. Oncology as well as radiation oncology both following the patient. With ongoing MRSA sepsis, currently planned [facility] treatment for patient is on hold. There is also a potential that the patient may not need SRS since there is a potential  that the brain lesions  could be associated with septic emboli rather than metastatic cancer.  4. Thrombocytopenia. Initially was given heparin for DVT prophylaxis which has been on hold. HIT panel negative. Thought to be secondary to Depakote and despite changing to [place] continues to have thrombocytopenia. At present my discussion with oncology it appears that this is likely associated with sepsis. With invasive procedures patient is at high risk for bleeding. Discussed with oncology and patient can be transfused when necessary for platelets without any contraindication. S/P 1 platelet transfusion on 04/04/2016 for procedure.  5. Type 2 diabetes mellitus. Steroid-induced hyperglycemia. Continue Lantus continue sliding scale insulin.  6. Constipaton, currently diarrhea Currently resolved, changed to Zofran when necessary.  7. Hyponatremia. Likely due to poor oral intake. Improving with IV hydration. Continue to closely monitor.  8. Hyperkalemia. Resolved Continue monitoring. Received Kayexalate.  9. Protein calorie malnutrition. In the setting of ongoing sepsis as well as poor oral intake. Dietary consulted.  10. Goals of care discussion. Palliative care engaging with patient's daughter. After discussion with patient's daughter on 04/05/2016 patient's code status is changed to DO NOT RESUSCITATE as per request from patient's daughter.  Pain management: Scheduled Tylenol as well as when necessary Tylenol Activity: Consulted physical therapy Bowel regimen: last BM 04/05/2016 Diet: Cardiac diet aspiration precautions DVT Prophylaxis: mechanical compression device.  Advance goals of care discussion: DO NOT RESUSCITATE DO NOT INTUBATE, may need to transition to complete comfort given the extensiveness of the DVTs  Family Communication: no family was present at bedside, at the time of interview.  Disposition:  Discharge to likely SNF.   Consultants: Palliative care,  infectious disease, cardiology Procedures: Echocardiogram  Antibiotics: Anti-infectives    Start     Dose/Rate Route Frequency Ordered Stop   04/06/16 1400  ceFAZolin (ANCEF) IVPB 2g/100 mL premix  Status:  Discontinued     2 g 200 mL/hr over 30 Minutes Intravenous Every 8 hours 04/06/16 1150 04/06/16 1243   04/06/16 1300  vancomycin (VANCOCIN) 1,250 mg in sodium chloride 0.9 % 250 mL IVPB     1,250 mg 166.7 mL/hr over 90 Minutes Intravenous Every 24 hours 04/06/16 1243     04/05/16 1200  vancomycin (VANCOCIN) 1,250 mg in sodium chloride 0.9 % 250 mL IVPB  Status:  Discontinued     1,250 mg 166.7 mL/hr over 90 Minutes Intravenous Every 24 hours 04/04/16 1913 04/06/16 1150   04/01/16 1800  cefTRIAXone (ROCEPHIN) 2 g in dextrose 5 % 50 mL IVPB  Status:  Discontinued     2 g 100 mL/hr over 30 Minutes Intravenous Every 12 hours 04/01/16 1717 04/02/16 0810   04/01/16 1800  ampicillin (OMNIPEN) 2 g in sodium chloride 0.9 % 50 mL IVPB  Status:  Discontinued     2 g 150 mL/hr over 20 Minutes Intravenous Every 4 hours 04/01/16 1717 04/02/16 0810   04/01/16 1800  vancomycin (VANCOCIN) IVPB 750 mg/150 ml premix  Status:  Discontinued     750 mg 150 mL/hr over 60 Minutes Intravenous Every 12 hours 04/01/16 1731 04/04/16 1912   04/01/16 1730  vancomycin (VANCOCIN) IVPB 1000 mg/200 mL premix  Status:  Discontinued     1,000 mg 200 mL/hr over 60 Minutes Intravenous  Once 04/01/16 1717 04/01/16 1722     Subjective: Complains about pain in bilateral shoulder, no chest pain, abdominal pain seemed to be getting better as well.   Objective: Physical Exam: Vitals:   04/05/16 1233 04/05/16 2212 04/06/16 0608 04/06/16 1452  BP: 106/64 113/63 111/64 Marland Kitchen)  117/57  Pulse: (!) 59 61 61 (!) 59  Resp: 16 16 18 18   Temp: 97.5 F (36.4 C) 98.7 F (37.1 C) 97.7 F (36.5 C) 98.3 F (36.8 C)  TempSrc: Oral Oral Oral Oral  SpO2: 97% 97% 98% 96%  Weight:      Height:        Intake/Output Summary (Last 24  hours) at 04/06/16 1904 Last data filed at 04/06/16 1800  Gross per 24 hour  Intake              240 ml  Output             1200 ml  Net             -960 ml   Filed Weights   03/25/16 1022 03/25/16 2036 03/30/16 0550  Weight: 86.6 kg (191 lb) 79.3 kg (174 lb 14.4 oz) 85.9 kg (189 lb 6 oz)    General: Alert, Awake and Oriented to Time, Place and Person. Appear in moderate distress, affect appropriate Eyes: PERRL, Conjunctiva normal ENT: Oral Mucosa clear moist. Neck: NO JVD, NO Abnormal Mass Or lumps Cardiovascular: S1 and S2 Present, aortic systolic Murmur, Respiratory: Bilateral Air entry equal and Decreased, no use of accessory muscle, Clear to Auscultation, no Crackles, no wheezes Abdomen: Bowel Sound present, Soft and no tenderness Skin: no redness, diffuse Rash, no induration Extremities: bilateral Pedal edema, no calf tenderness, bilateral lower extremity swelling Neurologic: Grossly no focal neuro deficit. Bilaterally Equal motor strength  Data Reviewed: CBC:  Recent Labs Lab 04/02/16 0322 04/03/16 0544 04/03/16 1451 04/04/16 0627 04/05/16 0647 04/06/16 0546  WBC 6.1 5.0  --  3.8* 3.7* 3.8*  NEUTROABS 5.7 4.2  --  3.1 3.2 3.0  HGB 12.7* 11.5*  --  10.7* 12.1* 11.5*  HCT 35.3* 33.0*  --  30.7* 34.1* 32.5*  MCV 101.4* 101.2*  --  102.0* 101.2* 100.3*  PLT 33* 22* 44* 44* 43* 29*   Basic Metabolic Panel:  Recent Labs Lab 04/01/16 1736  04/02/16 0322 04/02/16 0720 04/03/16 0544 04/04/16 0627 04/05/16 0647 04/06/16 0546  NA 127*  < > 128* 130* 128* 132* 133*  --   K 5.5*  --  5.0  --  4.9 4.1 4.0  --   CL 89*  --  91*  --  91* 98* 97*  --   CO2 27  --  29  --  29 25 26   --   GLUCOSE 212*  --  197*  --  284* 105* 231*  --   BUN 29*  --  27*  --  27* 28* 31*  --   CREATININE 1.26*  --  1.15  --  1.20 0.87 0.92  --   CALCIUM 8.7*  --  8.3*  --  8.4* 7.9* 8.0*  --   MG  --   --  1.2*  --  1.7  --   --  1.6*  < > = values in this interval not  displayed.  Liver Function Tests:  Recent Labs Lab 04/01/16 1736 04/02/16 0322 04/03/16 0544 04/04/16 0627  AST 22 18 12* 15  ALT 18 17 14* 12*  ALKPHOS 96 89 81 80  BILITOT 0.9 0.7 0.8 1.2  PROT 5.2* 4.9* 4.5* 4.2*  ALBUMIN 2.2* 1.9* 1.6* 1.6*   No results for input(s): LIPASE, AMYLASE in the last 168 hours.  Recent Labs Lab 04/01/16 1736  AMMONIA 29   Coagulation Profile:  Recent Labs Lab 03/31/16  [id number] 04/02/16 0322 04/04/16 0627  INR 1.09 1.18 1.08   Cardiac Enzymes: No results for input(s): CKTOTAL, CKMB, CKMBINDEX, TROPONINI in the last 168 hours. BNP (last 3 results) No results for input(s): PROBNP in the last 8760 hours.  CBG:  Recent Labs Lab 04/05/16 1816 04/05/16 2216 04/06/16 0818 04/06/16 1228 04/06/16 1737  GLUCAP 271* 222* 220* 273* 268*    Studies: No results found.   Scheduled Meds: . acetaminophen  1,000 mg Oral Q8H  . dexamethasone  4 mg Oral Daily  . feeding supplement (GLUCERNA SHAKE)  237 mL Oral BID BM  . folic acid  1 mg Oral Daily  . insulin aspart  0-15 Units Subcutaneous TID WC  . insulin aspart  0-5 Units Subcutaneous QHS  . insulin glargine  5 Units Subcutaneous QHS  . levETIRAcetam  500 mg Oral BID  . mouth rinse  15 mL Mouth Rinse BID  . pantoprazole  40 mg Oral Daily  . saccharomyces boulardii  250 mg Oral BID  . sodium chloride flush  3 mL Intravenous Q12H  . vancomycin  1,250 mg Intravenous Q24H  . vitamin B-12  1,000 mcg Oral Daily   Continuous Infusions:   PRN Meds: acetaminophen, ALPRAZolam, chlorhexidine, lidocaine, morphine injection, ondansetron **OR** ondansetron (ZOFRAN) IV, polyethylene glycol, prochlorperazine, sodium chloride flush  Time spent: 30 minutes  Author: Berle Mull, MD [facility] Pager: [phone] 04/06/2016 7:04 PM  If 7PM-7AM, please contact night-coverage at [email or web address], password [facility]

## 2016-04-07 DIAGNOSIS — R079 Chest pain, unspecified: Secondary | ICD-10-CM

## 2016-04-07 DIAGNOSIS — Z515 Encounter for palliative care: Secondary | ICD-10-CM

## 2016-04-07 LAB — BASIC METABOLIC PANEL
ANION GAP: 10 (ref 5–15)
BUN: 38 mg/dL — ABNORMAL HIGH (ref 6–20)
CHLORIDE: 95 mmol/L — AB (ref 101–111)
CO2: 27 mmol/L (ref 22–32)
CREATININE: 0.89 mg/dL (ref 0.61–1.24)
Calcium: 8.3 mg/dL — ABNORMAL LOW (ref 8.9–10.3)
GFR calc non Af Amer: >60 mL/min (ref >60)
Glucose, Bld: 255 mg/dL — ABNORMAL HIGH (ref 65–99)
Potassium: 4.5 mmol/L (ref 3.5–5.1)
SODIUM: 132 mmol/L — AB (ref 135–145)

## 2016-04-07 LAB — CBC WITH DIFFERENTIAL/PLATELET
BASOS ABS: 0 10*3/uL (ref 0.0–0.1)
Basophils Relative: 1 %
EOS PCT: 0 %
Eosinophils Absolute: 0 10*3/uL (ref 0.0–0.7)
HEMATOCRIT: 31 % — AB (ref 39.0–52.0)
HEMOGLOBIN: 11.2 g/dL — AB (ref 13.0–17.0)
LYMPHS ABS: 0.5 10*3/uL — AB (ref 0.7–4.0)
LYMPHS PCT: 12 %
MCH: 35.9 pg — ABNORMAL HIGH (ref 26.0–34.0)
MCHC: 36.1 g/dL — AB (ref 30.0–36.0)
MCV: 99.4 fL (ref 78.0–100.0)
MONOS PCT: 5 %
Monocytes Absolute: 0.2 10*3/uL (ref 0.1–1.0)
NEUTROS PCT: 82 %
Neutro Abs: 3.2 10*3/uL (ref 1.7–7.7)
Platelets: 21 10*3/uL — CL (ref 150–400)
RBC: 3.12 MIL/uL — AB (ref 4.22–5.81)
RDW: 12.9 % (ref 11.5–15.5)
WBC MORPHOLOGY: INCREASED
WBC: 3.9 10*3/uL — AB (ref 4.0–10.5)

## 2016-04-07 LAB — GLUCOSE, CAPILLARY
Glucose-Capillary: 192 mg/dL — ABNORMAL HIGH (ref 65–99)
Glucose-Capillary: 208 mg/dL — ABNORMAL HIGH (ref 65–99)
Glucose-Capillary: 239 mg/dL — ABNORMAL HIGH (ref 65–99)
Glucose-Capillary: 278 mg/dL — ABNORMAL HIGH (ref 65–99)
Glucose-Capillary: 288 mg/dL — ABNORMAL HIGH (ref 65–99)

## 2016-04-07 LAB — CULTURE, BLOOD (ROUTINE X 2): CULTURE: NO GROWTH

## 2016-04-07 LAB — TROPONIN I
TROPONIN I: 0.04 ng/mL — AB (ref <0.03)
TROPONIN I: 0.04 ng/mL — AB (ref <0.03)
Troponin I: 0.09 ng/mL (ref <0.03)

## 2016-04-07 MED ORDER — PRO-STAT SUGAR FREE PO LIQD
30.0000 mL | Freq: Two times a day (BID) | ORAL | Status: DC
  Administered 2016-04-07 – 2016-04-08 (×4): 30 mL via ORAL
  Filled 2016-04-07 (×5): qty 30

## 2016-04-07 NOTE — Progress Notes (Signed)
Pharmacy Antibiotic Note [name] is a 82 y.o. male admitted on 03/25/2016 that is currently on vancomycin day 7 of 42 per ID for MRSA bacteremia with port a cath involvement that has since been removed.  Current dose of vancomycin is 1250 mg IV every 24 hours.    Plan: 1. Continue current dose of vancomycin  2. Will plan to obtain vancomycin trough in next 24 - 48 hours to evaluate current dosing regimen of vancomycin goal trough 15 - 20 3. SCr every 72 hours while on vancomycin  Height: 5\' 9"  (175.3 cm) Weight: 189 lb 6 oz (85.9 kg) IBW/kg (Calculated) : 70.7  Temp (24hrs), Avg:98.3 F (36.8 C), Min:97.2 F (36.2 C), Max:99.4 F (37.4 C)   Recent Labs Lab 04/01/16 1736 04/02/16 0322 04/02/16 0819 04/03/16 0544 04/04/16 0627 04/04/16 1704 04/05/16 0647 04/06/16 0546 04/07/16 0147  WBC 8.6 6.1  --  5.0 3.8*  --  3.7* 3.8* 3.9*  CREATININE 1.26* 1.15  --  1.20 0.87  --  0.92  --  0.89  LATICACIDVEN 3.2*  --  2.0*  --   --   --   --   --   --   VANCOTROUGH  --   --   --   --   --  21*  --   --   --     Estimated Creatinine Clearance: 67.1 mL/min (by C-G formula based on SCr of 0.89 mg/dL).    Allergies  Allergen Reactions  . Codeine Other (See Comments)    Blisters between fingers  . Docetaxel Rash     Antimicrobials this admission:   Vancomycin 10/11>> Rocephin 10/11>> 10/12 Ampicillin 10/11>>10/12   Pertinent levels: 10/14: VT 21 on 750 mg IV every 12 hours and SCr 0.87   Microbiology results:  10/15: BCx: ngtd 10/13: port a cath tip: MRSA 10/11 BCx: MRSA 10/10 MRSA surgical PCR: positive  Thank you for allowing pharmacy to be a part of this patient's care.  Vincenza Hews, PharmD, BCPS 04/07/2016, 7:52 AM Pager: [phone]

## 2016-04-07 NOTE — Care Management Important Message (Signed)
Important Message  Patient Details  Name: [name] MRN: [id number] Date of Birth: [birth year]   Medicare Important Message Given:  Yes    Carles Collet, RN 04/07/2016, 10:48 AM

## 2016-04-07 NOTE — Progress Notes (Signed)
PT Cancellation Note  Patient Details Name: [name] MRN: [id number] DOB: Jul 26, 1929   Cancelled Treatment:    Reason Eval/Treat Not Completed: Medical issues which prohibited therapy Pt with episode of chest pain last night, elevated troponin, DVTs UEs, no SVC filter or anticoagulation initiated and has decreasing platelets. Holding PT treatment today due to above. Will follow up as appropriate tomorrow.  Marguarite Arbour A Eutimio Gharibian 04/07/2016, 8:57 AM  Wray Kearns, PT, DPT [phone]

## 2016-04-07 NOTE — Progress Notes (Signed)
[facility] Progress Note  Patient: [name] [id number]   PCP: Gabriel Pao, MD DOB: July 16, 1929   DOA: 03/25/2016   DOS: 04/07/2016   Date of Service: the patient was seen and examined on 04/07/2016  Brief hospital course: [name] an 82 y.o.malewith H/o atrial fibrillation not on anticoagulation, pacemaker,mod AS, subdural hematoma, diet controlled diabetes, transitional cell carcinoma with intermittent hematuria, s/p chemotherapy, TIA, HTN was discharged from [facility] 9/13 to [facility] after long hospitalization for new onset seizures and possible brain metastasis noted on CT head, he was seen by Neurology, Neurosurgery and Oncology, couldn't have an MRI due to pacer. Finally plan was for FU with Dr.Shadad in Nov with plan for repeat CT with SRS protocol. Patient was discharged from [facility] on 9/13. Then readmitted with weakness and failure to thrive, noted to have AKI, creatinine of 1.9 from baseline of 1, NA was 126, K was 6.2 CT head 03/31/2016 shows necrotic right frontal mass 2 cm, left occipital mass 12 mm. Neurology, Rad Onc and Oncology following, unable to have MRI. Discussed again on tumor board 10/9 Palliative consulted 10/9: pleuritic chest pain-CTA chest with acute on chronic PE-not anticoagulation candidate 10/10: IVC filter 10/11 developing acute encephalopathy, workup shows MRSA sepsis 10/12  CODE STATUS changed to DO NOT RESUSCITATE after discussion with patient's daughter Currently further plan is continue IV antibiotics.  Assessment and Plan: 1. MRSA bacteremia  Sepsis associated with bacteremia. Multifocal nodular lung opacities on CT chest, septic emboli  Patient presents with complaints of weakness and failure to thrive. Recently admitted for new onset seizures as well as brain lesions suspicious for metastasis. Patient developed fever during the hospitalization on 03/31/2016 with acute encephalopathy. Workup showed  positive blood cultures for MRSA. There is a potential that the brain lesions are also septic emboli although neurology does not feel that the lesion appears to be abscess. Unable to obtain MRI due to patient's pacemaker wire not compatible with MRI. Likely source of infection is central lines with Port-A-Cath as well as right IJ catheter.  At present patient is on IV vancomycin. Consulted interventional radiology to remove both of central line. Removed on 04/04/2016. Infectious diseases consulted. MRSA likely also causing possible meningitis. Pharmacy and infectious diseases monitoring the dosing. Not a candidate for pacemaker extraction, appreciate EP consult. Repeat culture performed negative but getting right upper extremity doppler before ordering picc line as it will be high risk in placing picc line in pt with upper extremity DVT.  2. DVT Acute on chronic. Lower extremity Dopplers are positive for DVT Not a candidate for anticoagulation due to thrombocytopenia with brain lesions. Underwent IVC filter placement on 03/31/2016. Continue to monitor. Patient has bilateral upper extremity swelling, left upper extremity Doppler is positive for DVT, he may likely very well have DVT in the right upper extremity. Discussed with interventional radiology, superior vena cava filter do not appear beneficial in this situation. We will discuss with family regarding this and further options.  3. Metastatic brain lesions Seizure disorder. mental status remaining stable.  Initially diagnosed in September 2017. Also the blood seizures and discharged on Depakote. Due to thrombocytopenia and Depakote was changed to Keppra. Repeat EEG negative for any ongoing seizures and therefore continue with 500 mg Keppra per neurology. Continue Decadron for CNS lesions. Oncology as well as radiation oncology both following the patient. With ongoing MRSA sepsis, currently planned [name] treatment for patient is on  hold. There is also a potential  that the patient may not need SRS since there is a potential that the brain lesions could be associated with septic emboli rather than metastatic cancer.  4. Thrombocytopenia. Initially was given heparin for DVT prophylaxis which has been on hold. HIT panel negative. Thought to be secondary to Depakote and despite changing to [place] continues to have thrombocytopenia. At present my discussion with oncology it appears that this is likely associated with sepsis. With invasive procedures patient is at high risk for bleeding. Discussed with oncology and patient can be transfused when necessary for platelets without any contraindication. S/P 1 platelet transfusion on 04/04/2016 for procedure.  5. Type 2 diabetes mellitus. Steroid-induced hyperglycemia. Continue Lantus continue sliding scale insulin.  6. Constipaton, currently diarrhea Currently resolved, changed to Zofran when necessary.  7. Hyponatremia. Likely due to poor oral intake. Improving with IV hydration. Continue to closely monitor.  8. Hyperkalemia. Resolved Continue monitoring. Received Kayexalate.  9. Protein calorie malnutrition. In the setting of ongoing sepsis as well as poor oral intake. Dietary consulted.  10. Goals of care discussion. Palliative care engaging with patient's daughter. After discussion with patient's daughter on 04/05/2016 patient's code status is changed to DO NOT RESUSCITATE as per request from patient's daughter. I discuss with pt's daughter that with extensive DVTs the pt is at high risk of developing life threatening complication. She understand that pt can have a terminal event anytime given multitude of complication at present. She tells me that he does not want to give up and I will also want him to get better.  Pain management: Scheduled Tylenol as well as when necessary Tylenol Activity: Consulted physical therapy Bowel regimen: last BM 04/07/2016 Diet:  Cardiac diet aspiration precautions DVT Prophylaxis: mechanical compression device.  Advance goals of care discussion: DO NOT RESUSCITATE DO NOT INTUBATE, may need to transition to complete comfort given the extensiveness of the DVTs  Family Communication: no family was present at bedside, at the time of interview. D/W with daughter on call. Disposition:  Discharge to likely SNF.   Consultants: Palliative care, infectious disease, cardiology Procedures: Echocardiogram  Antibiotics: Anti-infectives    Start     Dose/Rate Route Frequency Ordered Stop   04/06/16 1400  ceFAZolin (ANCEF) IVPB 2g/100 mL premix  Status:  Discontinued     2 g 200 mL/hr over 30 Minutes Intravenous Every 8 hours 04/06/16 1150 04/06/16 1243   04/06/16 1300  vancomycin (VANCOCIN) 1,250 mg in sodium chloride 0.9 % 250 mL IVPB     1,250 mg 166.7 mL/hr over 90 Minutes Intravenous Every 24 hours 04/06/16 1243     04/05/16 1200  vancomycin (VANCOCIN) 1,250 mg in sodium chloride 0.9 % 250 mL IVPB  Status:  Discontinued     1,250 mg 166.7 mL/hr over 90 Minutes Intravenous Every 24 hours 04/04/16 1913 04/06/16 1150   04/01/16 1800  cefTRIAXone (ROCEPHIN) 2 g in dextrose 5 % 50 mL IVPB  Status:  Discontinued     2 g 100 mL/hr over 30 Minutes Intravenous Every 12 hours 04/01/16 1717 04/02/16 0810   04/01/16 1800  ampicillin (OMNIPEN) 2 g in sodium chloride 0.9 % 50 mL IVPB  Status:  Discontinued     2 g 150 mL/hr over 20 Minutes Intravenous Every 4 hours 04/01/16 1717 04/02/16 0810   04/01/16 1800  vancomycin (VANCOCIN) IVPB 750 mg/150 ml premix  Status:  Discontinued     750 mg 150 mL/hr over 60 Minutes Intravenous Every 12 hours 04/01/16 1731 04/04/16 1912  04/01/16 1730  vancomycin (VANCOCIN) IVPB 1000 mg/200 mL premix  Status:  Discontinued     1,000 mg 200 mL/hr over 60 Minutes Intravenous  Once 04/01/16 1717 04/01/16 1722     Subjective: pt has been c/o chest pain overnight but no chest pain at the time of my  eval.  Objective: Physical Exam: Vitals:   04/06/16 1452 04/06/16 2300 04/07/16 0617 04/07/16 1619  BP: (!) 117/57 110/64 99/67 133/67  Pulse: (!) 59 60 60 60  Resp: 18 18 17 18   Temp: 98.3 F (36.8 C) 99.4 F (37.4 C) 97.2 F (36.2 C) 97.8 F (36.6 C)  TempSrc: Oral Oral Oral Oral  SpO2: 96% 92% (!) 85%   Weight:      Height:        Intake/Output Summary (Last 24 hours) at 04/07/16 1916 Last data filed at 04/07/16 1750  Gross per 24 hour  Intake              850 ml  Output             1100 ml  Net             -250 ml   Filed Weights   03/25/16 1022 03/25/16 2036 03/30/16 0550  Weight: 86.6 kg (191 lb) 79.3 kg (174 lb 14.4 oz) 85.9 kg (189 lb 6 oz)    General: Alert, Awake and Oriented to Time, Place and Person. Appear in moderate distress, affect appropriate Eyes: PERRL, Conjunctiva normal ENT: Oral Mucosa clear moist. Neck: NO JVD, NO Abnormal Mass Or lumps Cardiovascular: S1 and S2 Present, aortic systolic Murmur, Respiratory: Bilateral Air entry equal and Decreased, no use of accessory muscle, Clear to Auscultation, no Crackles, no wheezes Abdomen: Bowel Sound present, Soft and no tenderness Skin: no redness, diffuse Rash, no induration Extremities: bilateral Pedal edema, no calf tenderness, bilateral lower extremity swelling Neurologic: Grossly no focal neuro deficit. Bilaterally Equal motor strength  Data Reviewed: CBC:  Recent Labs Lab 04/03/16 0544 04/03/16 1451 04/04/16 0627 04/05/16 0647 04/06/16 0546 04/07/16 0147  WBC 5.0  --  3.8* 3.7* 3.8* 3.9*  NEUTROABS 4.2  --  3.1 3.2 3.0 3.2  HGB 11.5*  --  10.7* 12.1* 11.5* 11.2*  HCT 33.0*  --  30.7* 34.1* 32.5* 31.0*  MCV 101.2*  --  102.0* 101.2* 100.3* 99.4  PLT 22* 44* 44* 43* 29* 21*   Basic Metabolic Panel:  Recent Labs Lab 04/02/16 0322 04/02/16 0720 04/03/16 0544 04/04/16 0627 04/05/16 0647 04/06/16 0546 04/07/16 0147  NA 128* 130* 128* 132* 133*  --  132*  K 5.0  --  4.9 4.1 4.0   --  4.5  CL 91*  --  91* 98* 97*  --  95*  CO2 29  --  29 25 26   --  27  GLUCOSE 197*  --  284* 105* 231*  --  255*  BUN 27*  --  27* 28* 31*  --  38*  CREATININE 1.15  --  1.20 0.87 0.92  --  0.89  CALCIUM 8.3*  --  8.4* 7.9* 8.0*  --  8.3*  MG 1.2*  --  1.7  --   --  1.6*  --     Liver Function Tests:  Recent Labs Lab 04/01/16 1736 04/02/16 0322 04/03/16 0544 04/04/16 0627  AST 22 18 12* 15  ALT 18 17 14* 12*  ALKPHOS 96 89 81 80  BILITOT 0.9 0.7 0.8 1.2  PROT 5.2* 4.9*  4.5* 4.2*  ALBUMIN 2.2* 1.9* 1.6* 1.6*   No results for input(s): LIPASE, AMYLASE in the last 168 hours.  Recent Labs Lab 04/01/16 1736  AMMONIA 29   Coagulation Profile:  Recent Labs Lab 04/02/16 0322 04/04/16 0627  INR 1.18 1.08   Cardiac Enzymes:  Recent Labs Lab 04/07/16 0147 04/07/16 0556 04/07/16 1541  TROPONINI 0.04* 0.04* 0.09*   BNP (last 3 results) No results for input(s): PROBNP in the last 8760 hours.  CBG:  Recent Labs Lab 04/06/16 1737 04/06/16 2331 04/07/16 1013 04/07/16 1257 04/07/16 1727  GLUCAP 268* 278* 208* 288* 239*    Studies: No results found.   Scheduled Meds: . acetaminophen  1,000 mg Oral Q8H  . dexamethasone  4 mg Oral Daily  . feeding supplement (GLUCERNA SHAKE)  237 mL Oral BID BM  . feeding supplement (PRO-STAT SUGAR FREE 64)  30 mL Oral BID  . folic acid  1 mg Oral Daily  . insulin aspart  0-15 Units Subcutaneous TID WC  . insulin aspart  0-5 Units Subcutaneous QHS  . insulin glargine  5 Units Subcutaneous QHS  . levETIRAcetam  500 mg Oral BID  . mouth rinse  15 mL Mouth Rinse BID  . pantoprazole  40 mg Oral Daily  . saccharomyces boulardii  250 mg Oral BID  . sodium chloride flush  3 mL Intravenous Q12H  . vancomycin  1,250 mg Intravenous Q24H  . vitamin B-12  1,000 mcg Oral Daily   Continuous Infusions:   PRN Meds: acetaminophen, ALPRAZolam, chlorhexidine, lidocaine, morphine injection, ondansetron **OR** ondansetron (ZOFRAN) IV,  polyethylene glycol, prochlorperazine, sodium chloride flush  Time spent: 30 minutes  Author: Berle Mull, MD [facility] Pager: [phone] 04/07/2016 7:16 PM  If 7PM-7AM, please contact night-coverage at [email or web address], password [facility]

## 2016-04-07 NOTE — Progress Notes (Signed)
Daily Progress Note   Patient Name: [name]       Date: 04/07/2016 DOB: 12-03-1929  Age: 82 y.o. MRN#: [id number] Attending Physician: Lavina Hamman, MD Primary Care Physician: [name], MD Admit Date: 03/25/2016  Reason for Consultation/Follow-up: Establishing goals of care and Psychosocial/spiritual support  Subjective: Evaluated patient. No family at bedside. Assisted with feeding patient lunch. He's having generalized pain "waist up". Scheduled Tylenol and prn morphine being utilized. Patient had very limited verbal responses and no orientation. Ate well- full diet and thin liquids. Spoke with [name] via telephone. No questions or plans to change current trajectory of care or goals.   Review of Systems  Unable to perform ROS: Patient nonverbal   Length of Stay: 12  Current Medications: Scheduled Meds:  . acetaminophen  1,000 mg Oral Q8H  . dexamethasone  4 mg Oral Daily  . feeding supplement (GLUCERNA SHAKE)  237 mL Oral BID BM  . feeding supplement (PRO-STAT SUGAR FREE 64)  30 mL Oral BID  . folic acid  1 mg Oral Daily  . insulin aspart  0-15 Units Subcutaneous TID WC  . insulin aspart  0-5 Units Subcutaneous QHS  . insulin glargine  5 Units Subcutaneous QHS  . levETIRAcetam  500 mg Oral BID  . mouth rinse  15 mL Mouth Rinse BID  . pantoprazole  40 mg Oral Daily  . saccharomyces boulardii  250 mg Oral BID  . sodium chloride flush  3 mL Intravenous Q12H  . vancomycin  1,250 mg Intravenous Q24H  . vitamin B-12  1,000 mcg Oral Daily    Continuous Infusions:    PRN Meds: acetaminophen, ALPRAZolam, chlorhexidine, lidocaine, morphine injection, ondansetron **OR** ondansetron (ZOFRAN) IV, polyethylene glycol, prochlorperazine, sodium chloride flush  Physical  Exam  Constitutional: He appears well-developed and well-nourished.  Frail elderly  Cardiovascular: Normal rate and regular rhythm.   Pulmonary/Chest: Effort normal and breath sounds normal.  Musculoskeletal: He exhibits edema.  BUE  Neurological:  Extremely HOH  Skin:  BUE red, skin dry and peeling  Psychiatric:  Very flat            Vital Signs: BP 99/67 (BP Location: Right Arm)   Pulse 60   Temp 97.2 F (36.2 C) (Oral)   Resp 17   Ht 5\' 9"  (1.753  m)   Wt 85.9 kg (189 lb 6 oz)   SpO2 (!) 85%   BMI 27.97 kg/m  SpO2: SpO2: (!) 85 % O2 Device: O2 Device: Not Delivered O2 Flow Rate: O2 Flow Rate (L/min): 2 L/min  Intake/output summary:   Intake/Output Summary (Last 24 hours) at 04/07/16 1322 Last data filed at 04/06/16 2300  Gross per 24 hour  Intake                0 ml  Output             1250 ml  Net            -1250 ml   LBM: Last BM Date: 04/05/16 Baseline Weight: Weight: 86.6 kg (191 lb) Most recent weight: Weight: 85.9 kg (189 lb 6 oz)       Palliative Assessment/Data: PPS: 30%    Flowsheet Rows   Flowsheet Row Most Recent Value  Intake Tab  Referral Department  Hospitalist  Unit at Time of Referral  Other (Comment)  Palliative Care Primary Diagnosis  Cancer  Date Notified  03/30/16  Palliative Care Type  New Palliative care  Reason for referral  Clarify Goals of Care, Counsel Regarding Hospice  Date of Admission  03/25/16  Date first seen by Palliative Care  03/30/16  # of days Palliative referral response time  0 Day(s)  # of days IP prior to Palliative referral  5  Clinical Assessment  Palliative Performance Scale Score  30%  Psychosocial & Spiritual Assessment  Palliative Care Outcomes  Patient/Family meeting held?  Yes  Who was at the meeting?  [name] and 1 other dtr, 2 grandsons  Palliative Care Outcomes  Improved pain interventions, Other (Comment)      Patient Active Problem List   Diagnosis Date Noted  . MRSA bacteremia 04/02/2016    . PE (pulmonary thromboembolism) ([place])   . Acute encephalopathy   . Mass   . Catheter-related bloodstream infection   . Staphylococcus aureus bacteremia with sepsis ([place])   . DIC (disseminated intravascular coagulation) ([place])   . Brain metastases ([place])   . Goals of care, counseling/discussion   . Acute on chronic alteration in mental status   . Palliative care encounter   . DNR (do not resuscitate) discussion   . Hyperglycemia 03/25/2016  . Brain lesion 03/25/2016  . AKI (acute kidney injury) ([place]) 03/25/2016  . Hyperkalemia 03/25/2016  . Hydronephrosis 03/25/2016  . Dehydration   . Hydronephrosis due to obstruction of ureter   . Elevated troponin   . Seizure ([place])   . Abnormal brain CT 03/03/2016  . Hypokalemia   . Seizures ([place]) 02/29/2016  . Syncope 02/28/2016  . Port catheter in place 02/21/2016  . Neutropenic fever ([place])   . Malnutrition of moderate degree 08/28/2015  . Diet-controlled diabetes mellitus ([place])   . Coronary artery disease 08/27/2015  . Thrombocytopenia ([place]) 08/27/2015  . Hematuria 08/27/2015  . Pancytopenia ([place]) 08/27/2015  . Sepsis ([place])   . 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 Chest pain 07/17/2014  . Aortic stenosis 04/29/2013  . Thrombocytosis ([place]) 03/30/2013  . Abnormal urine cytology 05/02/2012  . Acute pulmonary edema ([place]) 10/04/2011  . Alcohol withdrawal delirium ([place]) 10/04/2011  . TIA (transient ischemic attack) 10/03/2011  . Subdural hematoma ([place]) 10/02/2011  . ETOH abuse 10/02/2011  . Permanent atrial fibrillation ([place]) 10/02/2011  . UTI (lower urinary tract infection) 10/02/2011  . Elevated PSA 05/19/2011  . Pacemaker 05/19/2011  .  Diabetes mellitus ([place]) 05/19/2011  . Hypertension 05/19/2011  . Dyslipidemia 05/19/2011    Palliative Care Assessment & Plan   Patient Profile: 82 y.o. 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 He was recently hospitalized from 9/8-9/13 for new onset-seizures and found to have a left occipital lobe mass and a right frontal lobe mass on CT head. He is unable to have MRI due to his pacemaker leads.  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  He developed chest pain and was found to have bilateral multiple PEs.  He is not a candidate for anticoagulation due to SDH and thrombocytopenia.  An IVC filter was placed on 10/10.   He is not a candidate for anticoagulation.  The next step is a one time SRS procedure thru radiation oncology that will palliate the brain mets, the primary cancer will still likely take his life at some point. Suspicion of infection at pacemaker, but not candidate for pacemaker removal. Plan is to continue IV antibiotics for 6 weeks, then transition to lifetime oral suppression.  Assessment/Recommendations/Plan: - DNR  -Continue current level of care, treat what is treatable -Pain control with scheduled tylenol and prn morphine, will adjust morphine if needed. -Recommend palliative services at d/c   Goals of Care and Additional Recommendations:  Limitations on Scope of Treatment: Full Scope Treatment  Code Status:  DNR  Prognosis:   Unable to determine  Discharge Planning:  [place] for rehab with Palliative care service follow-up  Care plan was discussed with [name] via telephone.  Thank you for allowing the Palliative Medicine Team to assist in the care of this patient.   Time In: 1250 Time Out: 1330 Total Time 40 min Prolonged Time Billed no      Greater than 50%  of this time was spent counseling and coordinating care related to the above assessment and plan.  Mariana Kaufman, AGNP-C Palliative  Medicine  Please call Palliative Medicine team phone with any questions [phone]. For individual providers please see AMION.

## 2016-04-07 NOTE — Progress Notes (Addendum)
At [place], patient started c/o of 10/10 center chest pain. PRN morphine given. [place] notified. EKG obtained and results sent to [place]. Pt stated the pain went down from a 10 to an 8 to a 4 to a 2 and then the pain was gone. Kirby,NP returned page stating that pleuritic chest pain is normal in patient with PE's. Kirby,NP ordered lab draws for troponins q6hrs X 3. Will continue to monitor and treat per MD orders.

## 2016-04-07 NOTE — Progress Notes (Signed)
CRITICAL VALUE ALERT  Critical value received:  Troponin 0.04  Date of notification:  04/07/16  Time of notification:  0238  Critical value read back:Yes.    Nurse who received alert:  Lubertha [place]  MD notified (1st page):  Kirby,NP  Time of first page:  53  Kirby,NP did not return page. Will continue to monitor and treat per MD orders.

## 2016-04-07 NOTE — Progress Notes (Signed)
Nutrition Follow-up  DOCUMENTATION CODES:   Not applicable  INTERVENTION:   -Continue Glucerna Shake po TID, each supplement provides 220 kcal and 10 grams of protein -30 ml Prostat BID, each supplement provides 100 kcals and 15 grams protein  NUTRITION DIAGNOSIS:   Predicted suboptimal nutrient intake related to acute illness as evidenced by percent weight loss.  Ongoing  GOAL:   Patient will meet greater than or equal to 90% of their needs  Unmet  MONITOR:   PO intake, Supplement acceptance, Labs, Weight trends, Skin, I & O's  REASON FOR ASSESSMENT:   Malnutrition Screening Tool    ASSESSMENT:   Patient was [facility] from [facility] directly to [place] for rehab. Patient was discharged from [facility] 9 days ago. At that time he was feeling near his baseline. However, he quickly deteriorated developing generalized weakness which is been constant and progressive.   Pt very somnolent at time of visit. Pt did not arise to sound of name being called.   Reviewed ID note from from 04/06/16; pt with MRSA bacteremia. Pt receiving IV vancomycin. Plan for PICC placement today if cultures have been negative for 48 hours.   Pt underwent port-a-cath removal on 04/03/16.  Oral intake has decreased significantly since last visit. Documented meal completion 0-15%. Suspect somnolence is likely a factor to PO adequacy. Observed breakfast tray on tray table, which was unattempted.   Palliative care team following; goals of care discussions ongoing. Family continues to desire full scope of treatment at this time.   Labs reviewed: Na: 132, CBGS: 268-278.  Diet Order:  Diet heart healthy/carb modified Room service appropriate? Yes; Fluid consistency: Thin  Skin:  Reviewed, no issues  Last BM:  04/05/16  Height:   Ht Readings from Last 1 Encounters:  03/25/16 5\' 9"  (1.753 m)    Weight:   Wt Readings from Last 1 Encounters:  03/30/16 189 lb 6 oz (85.9 kg)    Ideal Body  Weight:  72.7 kg  BMI:  Body mass index is 27.97 kg/m.  Estimated Nutritional Needs:   Kcal:  1700-1900  Protein:  85-100 grams  Fluid:  1.7-1.9 L  EDUCATION NEEDS:   No education needs identified at this time  Dewight Catino A. Jimmye Norman, RD, LDN, CDE Pager: [phone] After hours Pager: [phone]

## 2016-04-07 NOTE — Progress Notes (Addendum)
CSW continuing to follow for possible SNF placement- pt discussed in progression- pt with new complications and not stable to go to SNF at this time  Jorge Ny, [name] Worker [phone]

## 2016-04-08 ENCOUNTER — Inpatient Hospital Stay (HOSPITAL_COMMUNITY): Payer: Medicare Other

## 2016-04-08 DIAGNOSIS — M7989 Other specified soft tissue disorders: Secondary | ICD-10-CM

## 2016-04-08 LAB — CBC WITH DIFFERENTIAL/PLATELET
BASOS PCT: 1 %
Basophils Absolute: 0 10*3/uL (ref 0.0–0.1)
EOS PCT: 0 %
Eosinophils Absolute: 0 10*3/uL (ref 0.0–0.7)
HCT: 33.3 % — ABNORMAL LOW (ref 39.0–52.0)
HEMOGLOBIN: 11.7 g/dL — AB (ref 13.0–17.0)
LYMPHS PCT: 16 %
Lymphs Abs: 0.7 10*3/uL (ref 0.7–4.0)
MCH: 35.3 pg — AB (ref 26.0–34.0)
MCHC: 35.1 g/dL (ref 30.0–36.0)
MCV: 100.6 fL — AB (ref 78.0–100.0)
Monocytes Absolute: 0.1 10*3/uL (ref 0.1–1.0)
Monocytes Relative: 2 %
NEUTROS PCT: 81 %
Neutro Abs: 3.3 10*3/uL (ref 1.7–7.7)
Platelets: 15 10*3/uL — CL (ref 150–400)
RBC: 3.31 MIL/uL — ABNORMAL LOW (ref 4.22–5.81)
RDW: 12.7 % (ref 11.5–15.5)
WBC MORPHOLOGY: INCREASED
WBC: 4.1 10*3/uL (ref 4.0–10.5)

## 2016-04-08 LAB — BASIC METABOLIC PANEL
ANION GAP: 7 (ref 5–15)
BUN: 40 mg/dL — AB (ref 6–20)
CHLORIDE: 95 mmol/L — AB (ref 101–111)
CO2: 30 mmol/L (ref 22–32)
Calcium: 8.7 mg/dL — ABNORMAL LOW (ref 8.9–10.3)
Creatinine, Ser: 0.89 mg/dL (ref 0.61–1.24)
GFR calc Af Amer: >60 mL/min (ref >60)
GFR calc non Af Amer: >60 mL/min (ref >60)
GLUCOSE: 234 mg/dL — AB (ref 65–99)
POTASSIUM: 4.6 mmol/L (ref 3.5–5.1)
SODIUM: 132 mmol/L — AB (ref 135–145)

## 2016-04-08 LAB — GLUCOSE, CAPILLARY
GLUCOSE-CAPILLARY: 241 mg/dL — AB (ref 65–99)
Glucose-Capillary: 229 mg/dL — ABNORMAL HIGH (ref 65–99)
Glucose-Capillary: 223 mg/dL — ABNORMAL HIGH (ref 65–99)
Glucose-Capillary: 264 mg/dL — ABNORMAL HIGH (ref 65–99)

## 2016-04-08 LAB — MAGNESIUM: MAGNESIUM: 1.7 mg/dL (ref 1.7–2.4)

## 2016-04-08 MED ORDER — SODIUM CHLORIDE 0.9 % IV SOLN
700.0000 mg | INTRAVENOUS | Status: DC
  Administered 2016-04-08 – 2016-04-09 (×2): 700 mg via INTRAVENOUS
  Filled 2016-04-08 (×2): qty 14

## 2016-04-08 NOTE — Progress Notes (Signed)
[facility] for Infectious Disease    Date of Admission:  03/25/2016   Total days of antibiotics 9        Day 9 vanco           ID: [name] is a 82 y.o. 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 CT scan in January 2017 showed pelvic adenopathy indicating metastatic disease. He is status post salvage chemotherapy utilizing carboplatin and gemcitabine with his last CT scan obtained in September 2017 showed no evidence of metastatic disease. He is not currently eligible for any systemic therapy given his severe debilitation and poor performance status. He has head CT that was suspicious for metastatic disease for which they may due further radiation after abtx given. He was found to have MRSA bacteremia, porta cath removed, cx tip + as well. Has been afebrile, bacteremia cleared but has leukopenia, thrombocytopenia no active bleeds. 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)   Palliative care by specialist    Subjective: Afebrile, but having epistaxis this morning. Labs reveal plt decreased to 15  Medications:  . acetaminophen  1,000 mg Oral Q8H  . DAPTOmycin (CUBICIN)  IV  700 mg Intravenous Q24H  . dexamethasone  4 mg Oral Daily  . feeding supplement (GLUCERNA SHAKE)  237 mL Oral BID BM  . feeding  supplement (PRO-STAT SUGAR FREE 64)  30 mL Oral BID  . folic acid  1 mg Oral Daily  . insulin aspart  0-15 Units Subcutaneous TID WC  . insulin aspart  0-5 Units Subcutaneous QHS  . insulin glargine  5 Units Subcutaneous QHS  . levETIRAcetam  500 mg Oral BID  . mouth rinse  15 mL Mouth Rinse BID  . pantoprazole  40 mg Oral Daily  . saccharomyces boulardii  250 mg Oral BID  . sodium chloride flush  3 mL Intravenous Q12H  . vitamin B-12  1,000 mcg Oral Daily    Objective: Vital signs in last 24 hours: Temp:  [97.3 F (36.3 C)-97.8 F (36.6 C)] 97.8 F (36.6 C) (10/18 1536) Pulse Rate:  [59-116] 116 (10/18 1536) Resp:  [16-18] 18 (10/18 1536) BP: (114-133)/(61-87) 133/87 (10/18 1536) SpO2:  [98 %-99 %] 98 % (10/18 0530)  Physical Exam  Constitutional: He is oriented to person, place, and time. He appears frail. No distress.  HENT: blood dried in nares Mouth/Throat: Oropharynx is clear and moist. No oropharyngeal exudate.  Cardiovascular: Normal rate, regular rhythm and normal heart sounds. Exam reveals no gallop and no friction rub.  No murmur heard.  Chest wall: no pain around pacemaker pocket Pulmonary/Chest: Effort normal and breath sounds normal. No respiratory distress. He has no wheezes.  Abdominal: Soft. Bowel sounds are normal. He exhibits no distension. There is no tenderness.  Lymphadenopathy:  He has no cervical adenopathy.  Neurological:  He is alert and oriented to person, place, and time.  Ext: pain to his shoulders with ROM but no warmth or fluctuance noted Skin: Skin is warm and dry. No rash noted. No erythema.    Lab Results  Recent Labs  04/07/16 0147 04/08/16 0733  WBC 3.9* 4.1  HGB 11.2* 11.7*  HCT 31.0* 33.3*  NA 132* 132*  K 4.5 4.6  CL 95* 95*  CO2 27 30  BUN 38* 40*  CREATININE 0.89 0.89   Liver Panel No results for input(s): PROT, ALBUMIN, AST, ALT, ALKPHOS, BILITOT, BILIDIR, IBILI in the last 72 hours. No results found for: [place],  [place]  Microbiology: 10/15 blood cx ngtd 10/13 cath tip MRSA 10/12 blood cx MRSA 10/11 blood cx MRSA Studies/Results: No results found.   Assessment/Plan: Complicated MRSA bacteremia with portacath involved, since removed. Still has retained pacemaker and questionable CNS lesions of embolic vs. Metastatic lesions.   - plan for 6 wk of IV abtx. Will change him to daptomycin 8mg /kg daily due to worsening thrombocytopenia. Currently on day 9/42.then place on oral suppression for his retained pacemaker. TTE did not show evidence of vegetation though patient is poor candidate for TEE. Poor candidate for removal of device  - will need picc line, cane place on 10/17 if all blood cx ngtd at 48hr  Thrombocytopenia = will need close monitoring or for evidence of active bleeding. He is having some epistaxis. Would benefit from plt tsf for picc line placement  -encourage family meeting for goals of care, concern that 6 wk of IV therapy maybe difficult to complete given multiple co morbidities  Home health orders listed below  Diagnosis: mrsa bacteremia  Culture Result: MRSA  Allergies  Allergen Reactions  . Codeine Other (See Comments)    Blisters between fingers  . Docetaxel Rash    Discharge antibiotics: daptomycin at 8mg /kg Per pharmacy protocol   Duration: 6 wk End Date: Nov 20th  March ARB Per Protocol:  Labs weekly while on IV antibiotics: _x CBC with differential twice a week _x_ BMP twice a week _x_ CK weekly  _x_ Please pull PIC at completion of IV antibiotics __ Please leave PIC in place until doctor has seen patient or been notified  Fax weekly labs to [phone]  Clinic Follow Up Appt: 4-6 wk  @   [place], [facility] for Infectious Diseases Cell: [phone] Pager: [phone]  04/08/2016, 5:01 PM

## 2016-04-08 NOTE — Progress Notes (Signed)
[facility] Progress Note  Patient: [name] [id number]   PCP: [name], MD DOB: 1929-08-20   DOA: 03/25/2016   DOS: 04/08/2016   Date of Service: the patient was seen and examined on 04/08/2016  Brief hospital course: [facility] BAES an 82 y.o.malewith H/o atrial fibrillation not on anticoagulation, pacemaker,mod AS, subdural hematoma, diet controlled diabetes, transitional cell carcinoma with intermittent hematuria, s/p chemotherapy, TIA, HTN was discharged from [facility] 9/13 to [facility] for rehab after long hospitalization for new onset seizures and possible brain metastasis noted on CT head, he was seen by Neurology, Neurosurgery and Oncology, couldn't have an MRI due to pacer. Finally plan was for FU with Dr.Shadad in Nov with plan for repeat CT with SRS protocol. Patient was discharged from [facility] on 9/[facility]. Then readmitted with weakness and failure to thrive, noted to have AKI, creatinine of 1.9 from baseline of 1, NA was 126, K was 6.2 CT head 03/31/2016 shows necrotic right frontal mass 2 cm, left occipital mass 12 mm. Neurology, Rad Onc and Oncology following, unable to have MRI. Discussed again on tumor board 10/9 Palliative consulted 10/9: pleuritic chest pain-CTA chest with acute on chronic PE-not anticoagulation candidate 10/10: IVC filter placed 10/11 developing acute encephalopathy, workup shows MRSA sepsis 10/12  CODE STATUS changed to DO NOT RESUSCITATE after discussion with patient's daughter Currently further plan is continue IV antibiotics.  Assessment and Plan: 1. MRSA bacteremia  -Sepsis associated with bacteremia. -Multifocal nodular lung opacities on CT chest, likely septic emboli -Patient presents with complaints of weakness and failure to thrive. -Recently admitted for new onset seizures as well as brain lesions suspicious for metastasis. -Patient developed fever during the hospitalization on 03/31/2016 with acute  encephalopathy and further workup noted MRSA Bacteremia -There is a potential that the brain lesions are also septic emboli although neurology does not feel that the lesion appears to be abscess. -Unable to obtain MRI due to patient's pacemaker wire not compatible with MRI. -Likely source of infection is central lines with Port-A-Cath as well as right IJ catheter. -Port and [employer]. Removed on 04/04/2016. -Infectious diseases consulting -Not a candidate for pacemaker extraction, appreciate EP consult. -has swelling and LUE DVT, Dr.Patel also ordered duplex US of RUE-will await this before deciding on PICC -Has been on IV Vanc, this was changed to Daptomycin today 10/18  2. LUE DVT and Leg and Acute on chronic PEs -Not a candidate for anticoagulation due to thrombocytopenia with brain lesions. -Underwent IVC filter placement on 03/31/2016. - left upper extremity Doppler is positive for DVT, RUE duplex pending Discussed with interventional radiology, superior vena cava filter do not appear beneficial in this situation.  3. Suspected Metastatic brain lesions/ Seizure disorder -Due to thrombocytopenia and Depakote was changed to Keppra. Repeat EEG negative for any ongoing seizures and therefore continue with 500 mg Keppra per neurology. Continue Decadron for CNS lesions. Oncology as well as radiation oncology both following the patient. With ongoing MRSA sepsis, currently planned [facility] treatment for patient is on hold. There is also a potential that the patient may not need SRS since there is a potential that the brain lesions could be associated with septic emboli rather than metastatic cancer.  4. Thrombocytopenia. Initially was given heparin for DVT prophylaxis which has been on hold. HIT panel negative. Thought to be secondary to Depakote and despite changing to [place] continues to have thrombocytopenia. At present my discussion with oncology it appears that this is likely associated  with sepsis. With invasive procedures patient is at high risk for bleeding, will need platelet transfusion if <10K or active bleeding, will need platelets before PICC Discussed with oncology and patient can be transfused when necessary for platelets without any contraindication. S/P 1 platelet transfusion on 04/04/2016 for procedure.  5. Type 2 diabetes mellitus. Steroid-induced hyperglycemia. Continue Lantus continue sliding scale insulin.  6. Constipaton, currently diarrhea Currently resolved  7. Hyponatremia. Likely due to poor oral intake. Improving with IV hydration. Continue to closely monitor.  8. Hyperkalemia. Resolved Continue monitoring. Received Kayexalate.  9. Protein calorie malnutrition. In the setting of ongoing sepsis as well as poor oral intake. Dietary consulted.  10. Goals of care discussion. Palliative care following After discussion with patient's daughter on 04/05/2016 patient's code status is changed to DO NOT RESUSCITATE Dr.Patel discussed with pt's daughter that with extensive DVTs the pt is at high risk of developing life threatening complication. She understand that pt can have a terminal event anytime given multitude of complication at present. -currently plans for continuing full scope of Rx  DVT Prophylaxis: mechanical compression device.  Advance goals of care discussion: DO NOT RESUSCITATE DO NOT INTUBATE Family Communication: no family was present at bedside Disposition: Discharge to likely SNF.   Consultants: Palliative care, infectious disease, cardiology Procedures: Echocardiogram  Antibiotics: Anti-infectives    Start     Dose/Rate Route Frequency Ordered Stop   04/08/16 1300  DAPTOmycin (CUBICIN) 700 mg in sodium chloride 0.9 % IVPB     700 mg 228 mL/hr over 30 Minutes Intravenous Every 24 hours 04/08/16 1207     04/06/16 1400  ceFAZolin (ANCEF) IVPB 2g/100 mL premix  Status:  Discontinued     2 g 200 mL/hr over 30 Minutes  Intravenous Every 8 hours 04/06/16 1150 04/06/16 1243   04/06/16 1300  vancomycin (VANCOCIN) 1,250 mg in sodium chloride 0.9 % 250 mL IVPB  Status:  Discontinued     1,250 mg 166.7 mL/hr over 90 Minutes Intravenous Every 24 hours 04/06/16 1243 04/08/16 1140   04/05/16 1200  vancomycin (VANCOCIN) 1,250 mg in sodium chloride 0.9 % 250 mL IVPB  Status:  Discontinued     1,250 mg 166.7 mL/hr over 90 Minutes Intravenous Every 24 hours 04/04/16 1913 04/06/16 1150   04/01/16 1800  cefTRIAXone (ROCEPHIN) 2 g in dextrose 5 % 50 mL IVPB  Status:  Discontinued     2 g 100 mL/hr over 30 Minutes Intravenous Every 12 hours 04/01/16 1717 04/02/16 0810   04/01/16 1800  ampicillin (OMNIPEN) 2 g in sodium chloride 0.9 % 50 mL IVPB  Status:  Discontinued     2 g 150 mL/hr over 20 Minutes Intravenous Every 4 hours 04/01/16 1717 04/02/16 0810   04/01/16 1800  vancomycin (VANCOCIN) IVPB 750 mg/150 ml premix  Status:  Discontinued     750 mg 150 mL/hr over 60 Minutes Intravenous Every 12 hours 04/01/16 1731 04/04/16 1912   04/01/16 1730  vancomycin (VANCOCIN) IVPB 1000 mg/200 mL premix  Status:  Discontinued     1,000 mg 200 mL/hr over 60 Minutes Intravenous  Once 04/01/16 1717 04/01/16 1722     Subjective: feels weak and tired  Objective: Physical Exam: Vitals:   04/07/16 0617 04/07/16 1619 04/07/16 2224 04/08/16 0530  BP: 99/67 133/67 118/61 114/63  Pulse: 60 60 60 (!) 59  Resp: 17 18 17 16   Temp: 97.2 F (36.2 C) 97.8 F (36.6 C) 97.7 F (36.5 C) 97.3 F (36.3 C)  TempSrc:  Oral Oral Oral Oral  SpO2: (!) 85%  99% 98%  Weight:      Height:        Intake/Output Summary (Last 24 hours) at 04/08/16 1341 Last data filed at 04/07/16 2200  Gross per 24 hour  Intake               60 ml  Output              300 ml  Net             -240 ml   Filed Weights   03/25/16 1022 03/25/16 2036 03/30/16 0550  Weight: 86.6 kg (191 lb) 79.3 kg (174 lb 14.4 oz) 85.9 kg (189 lb 6 oz)    General: chronically  ill appearing, no distress Eyes: PERRL, Conjunctiva normal ENT: Oral Mucosa clear moist. Neck: NO JVD, NO Abnormal Mass Or lumps Cardiovascular: S1 and S2 Present, aortic systolic Murmur, Respiratory: Bilateral Air entry equal and Decreased, no use of accessory muscle, Clear to Auscultation, no Crackles, no wheezes Abdomen: Bowel Sound present, Soft and no tenderness Skin: no redness, diffuse Rash, no induration Extremities: bilateral Pedal edema, no calf tenderness, bilateral lower extremity swelling Neurologic: Grossly no focal neuro deficit. Bilaterally Equal motor strength  Data Reviewed: CBC:  Recent Labs Lab 04/04/16 0627 04/05/16 0647 04/06/16 0546 04/07/16 0147 04/08/16 0733  WBC 3.8* 3.7* 3.8* 3.9* 4.1  NEUTROABS 3.1 3.2 3.0 3.2 3.3  HGB 10.7* 12.1* 11.5* 11.2* 11.7*  HCT 30.7* 34.1* 32.5* 31.0* 33.3*  MCV 102.0* 101.2* 100.3* 99.4 100.6*  PLT 44* 43* 29* 21* 15*   Basic Metabolic Panel:  Recent Labs Lab 04/02/16 0322  04/03/16 0544 04/04/16 0627 04/05/16 0647 04/06/16 0546 04/07/16 0147 04/08/16 0733  NA 128*  < > 128* 132* 133*  --  132* 132*  K 5.0  --  4.9 4.1 4.0  --  4.5 4.6  CL 91*  --  91* 98* 97*  --  95* 95*  CO2 29  --  29 25 26   --  27 30  GLUCOSE 197*  --  284* 105* 231*  --  255* 234*  BUN 27*  --  27* 28* 31*  --  38* 40*  CREATININE 1.15  --  1.20 0.87 0.92  --  0.89 0.89  CALCIUM 8.3*  --  8.4* 7.9* 8.0*  --  8.3* 8.7*  MG 1.2*  --  1.7  --   --  1.6*  --  1.7  < > = values in this interval not displayed.  Liver Function Tests:  Recent Labs Lab 04/01/16 1736 04/02/16 0322 04/03/16 0544 04/04/16 0627  AST 22 18 12* 15  ALT 18 17 14* 12*  ALKPHOS 96 89 81 80  BILITOT 0.9 0.7 0.8 1.2  PROT 5.2* 4.9* 4.5* 4.2*  ALBUMIN 2.2* 1.9* 1.6* 1.6*   No results for input(s): LIPASE, AMYLASE in the last 168 hours.  Recent Labs Lab 04/01/16 1736  AMMONIA 29   Coagulation Profile:  Recent Labs Lab 04/02/16 0322 04/04/16 0627  INR  1.18 1.08   Cardiac Enzymes:  Recent Labs Lab 04/07/16 0147 04/07/16 0556 04/07/16 1541  TROPONINI 0.04* 0.04* 0.09*   BNP (last 3 results) No results for input(s): PROBNP in the last 8760 hours.  CBG:  Recent Labs Lab 04/07/16 1257 04/07/16 1727 04/07/16 2225 04/08/16 0805 04/08/16 1204  GLUCAP 288* 239* 192* 229* 223*    Studies: No results found.   Scheduled Meds: .  acetaminophen  1,000 mg Oral Q8H  . DAPTOmycin (CUBICIN)  IV  700 mg Intravenous Q24H  . dexamethasone  4 mg Oral Daily  . feeding supplement (GLUCERNA SHAKE)  237 mL Oral BID BM  . feeding supplement (PRO-STAT SUGAR FREE 64)  30 mL Oral BID  . folic acid  1 mg Oral Daily  . insulin aspart  0-15 Units Subcutaneous TID WC  . insulin aspart  0-5 Units Subcutaneous QHS  . insulin glargine  5 Units Subcutaneous QHS  . levETIRAcetam  500 mg Oral BID  . mouth rinse  15 mL Mouth Rinse BID  . pantoprazole  40 mg Oral Daily  . saccharomyces boulardii  250 mg Oral BID  . sodium chloride flush  3 mL Intravenous Q12H  . vitamin B-12  1,000 mcg Oral Daily   Continuous Infusions:   PRN Meds: acetaminophen, ALPRAZolam, chlorhexidine, lidocaine, morphine injection, ondansetron **OR** ondansetron (ZOFRAN) IV, polyethylene glycol, prochlorperazine, sodium chloride flush  Time spent: 30 minutes  Domenic Polite, MD [phone] 04/08/2016 1:41 PM  If 7PM-7AM, please contact night-coverage at [email or web address], password [facility]

## 2016-04-08 NOTE — Consult Note (Signed)
   [facility] CM Inpatient Consult   04/08/2016  [name] Mar 28, 1930 [id number]    [facility] Care Management follow up. Chart reviewed. Appears current discharge plan remains as SNF with possible palliative follow up. [facility] Care Management not appropriate at this time. Will discuss with inpatient RNCM.  Marthenia Rolling, MSN-Ed, RN,BSN [facility] Liaison [phone]

## 2016-04-08 NOTE — Progress Notes (Signed)
Pharmacy Antibiotic Note [name] is a 82 y.o. male admitted on 03/25/2016 that is currently on vancomycin for MRSA bacteremia with port a cath involvement that has since been removed. Vancomycin has been stopped and pharmacy asked to assist with dosing of Daptomycin.    Plan: 1. Begin Daptomycin 700 mg (~ 8 mg/kg) IV every 24 hours 2. Weekly CK's   Height: 5\' 9"  (175.3 cm) Weight: 189 lb 6 oz (85.9 kg) IBW/kg (Calculated) : 70.7  Temp (24hrs), Avg:97.6 F (36.4 C), Min:97.3 F (36.3 C), Max:97.8 F (36.6 C)   Recent Labs Lab 04/01/16 1736  04/02/16 0819 04/03/16 0544 04/04/16 0627 04/04/16 1704 04/05/16 0647 04/06/16 0546 04/07/16 0147 04/08/16 0733  WBC 8.6  < >  --  5.0 3.8*  --  3.7* 3.8* 3.9* 4.1  CREATININE 1.26*  < >  --  1.20 0.87  --  0.92  --  0.89 0.89  LATICACIDVEN 3.2*  --  2.0*  --   --   --   --   --   --   --   VANCOTROUGH  --   --   --   --   --  21*  --   --   --   --   < > = values in this interval not displayed.  Estimated Creatinine Clearance: 67.1 mL/min (by C-G formula based on SCr of 0.89 mg/dL).    Allergies  Allergen Reactions  . Codeine Other (See Comments)    Blisters between fingers  . Docetaxel Rash     Antimicrobials this admission:   Daptomycin 10/18 >>  Vancomycin 10/11>> 11/17 Rocephin 10/11>> 10/12 Ampicillin 10/11>>10/12   Pertinent levels: 10/14: VT 21 on 750 mg IV every 12 hours and SCr 0.87   Microbiology results:  10/15: BCx: ngtd 10/13: port a cath tip: MRSA 10/11 BCx: MRSA 10/10 MRSA surgical PCR: positive  Thank you for allowing pharmacy to be a part of this patient's care.  Vincenza Hews, PharmD, BCPS 04/08/2016, 12:10 PM Pager: [phone]

## 2016-04-08 NOTE — Progress Notes (Signed)
PT Cancellation Note  Patient Details Name: KALIB STARKOVICH MRN: [id number] DOB: [birth year]   Cancelled Treatment:    Reason Eval/Treat Not Completed: Medical issues which prohibited therapy. PT will continue to follow acutely.    [place] April, PTA Pager: [phone]   04/08/2016, 2:42 PM

## 2016-04-08 NOTE — Progress Notes (Addendum)
*  PRELIMINARY RESULTS* Vascular Ultrasound Right upper extremity venous duplex has been completed.  Preliminary findings: no evidence of DVT thrombosis in visualized veins. Superficial thrombosis noted in the right cephalic vein, at [facility]. Could not fully evaluate right forearm due to patient movement and yelling.   Landry Mellow, RDMS, RVT  04/08/2016, 4:56 PM

## 2016-04-09 ENCOUNTER — Ambulatory Visit: Payer: Medicare Other | Admitting: Radiation Oncology

## 2016-04-09 ENCOUNTER — Encounter: Payer: Self-pay | Admitting: Radiation Oncology

## 2016-04-09 DIAGNOSIS — Z515 Encounter for palliative care: Secondary | ICD-10-CM

## 2016-04-09 LAB — BASIC METABOLIC PANEL
Anion gap: 7 (ref 5–15)
BUN: 39 mg/dL — ABNORMAL HIGH (ref 6–20)
CHLORIDE: 96 mmol/L — AB (ref 101–111)
CO2: 30 mmol/L (ref 22–32)
CREATININE: 0.94 mg/dL (ref 0.61–1.24)
Calcium: 8.5 mg/dL — ABNORMAL LOW (ref 8.9–10.3)
Glucose, Bld: 299 mg/dL — ABNORMAL HIGH (ref 65–99)
POTASSIUM: 4.8 mmol/L (ref 3.5–5.1)
SODIUM: 133 mmol/L — AB (ref 135–145)

## 2016-04-09 LAB — CBC
HCT: 32.2 % — ABNORMAL LOW (ref 39.0–52.0)
HEMOGLOBIN: 11.1 g/dL — AB (ref 13.0–17.0)
MCH: 34.9 pg — ABNORMAL HIGH (ref 26.0–34.0)
MCHC: 34.5 g/dL (ref 30.0–36.0)
MCV: 101.3 fL — ABNORMAL HIGH (ref 78.0–100.0)
PLATELETS: 9 10*3/uL — AB (ref 150–400)
RBC: 3.18 MIL/uL — AB (ref 4.22–5.81)
RDW: 12.9 % (ref 11.5–15.5)
WBC: 4.3 10*3/uL (ref 4.0–10.5)

## 2016-04-09 LAB — GLUCOSE, CAPILLARY
GLUCOSE-CAPILLARY: 304 mg/dL — AB (ref 65–99)
GLUCOSE-CAPILLARY: 267 mg/dL — AB (ref 65–99)
GLUCOSE-CAPILLARY: 213 mg/dL — AB (ref 65–99)

## 2016-04-09 LAB — CK: CK TOTAL: 10 U/L — AB (ref 49–397)

## 2016-04-09 MED ORDER — BIOTENE DRY MOUTH MT LIQD: 15.0000 mL | OROMUCOSAL | Status: DC | PRN

## 2016-04-09 MED ORDER — MORPHINE SULFATE (CONCENTRATE) 10 MG/0.5ML PO SOLN
10.0000 mg | ORAL | Status: DC | PRN
  Administered 2016-04-10 (×3): 10 mg via SUBLINGUAL
  Filled 2016-04-09 (×3): qty 0.5

## 2016-04-09 MED ORDER — ACETAMINOPHEN 650 MG RE SUPP: 650.0000 mg | Freq: Four times a day (QID) | RECTAL | Status: DC | PRN

## 2016-04-09 MED ORDER — SODIUM CHLORIDE 0.9% FLUSH: 3.0000 mL | INTRAVENOUS | Status: DC | PRN

## 2016-04-09 MED ORDER — SODIUM CHLORIDE 0.9% FLUSH: 3.0000 mL | Freq: Two times a day (BID) | INTRAVENOUS | Status: DC

## 2016-04-09 MED ORDER — GLYCOPYRROLATE 0.2 MG/ML IJ SOLN: 0.2000 mg | INTRAMUSCULAR | Status: DC | PRN

## 2016-04-09 MED ORDER — GLYCOPYRROLATE 1 MG PO TABS
1.0000 mg | ORAL_TABLET | ORAL | Status: DC | PRN
Start: 2016-04-09 — End: 2016-04-10

## 2016-04-09 MED ORDER — HALOPERIDOL LACTATE 5 MG/ML IJ SOLN: 0.5000 mg | INTRAMUSCULAR | Status: DC | PRN

## 2016-04-09 MED ORDER — ONDANSETRON 4 MG PO TBDP: 4.0000 mg | ORAL_TABLET | Freq: Four times a day (QID) | ORAL | Status: DC | PRN

## 2016-04-09 MED ORDER — SODIUM CHLORIDE 0.9 % IV SOLN: 250.0000 mL | INTRAVENOUS | Status: DC | PRN

## 2016-04-09 MED ORDER — ONDANSETRON HCL 4 MG/2ML IJ SOLN: 4.0000 mg | Freq: Four times a day (QID) | INTRAMUSCULAR | Status: DC | PRN

## 2016-04-09 MED ORDER — ACETAMINOPHEN 325 MG PO TABS
650.0000 mg | ORAL_TABLET | Freq: Four times a day (QID) | ORAL | Status: DC | PRN
Start: 2016-04-09 — End: 2016-04-10

## 2016-04-09 MED ORDER — LORAZEPAM 2 MG/ML IJ SOLN: 1.0000 mg | INTRAMUSCULAR | Status: DC | PRN

## 2016-04-09 MED ORDER — LORAZEPAM 2 MG/ML PO CONC: 1.0000 mg | ORAL | Status: DC | PRN

## 2016-04-09 MED ORDER — SODIUM CHLORIDE 0.9 % IV SOLN
Freq: Once | INTRAVENOUS | Status: AC
  Administered 2016-04-09: 09:00:00 via INTRAVENOUS

## 2016-04-09 MED ORDER — DIPHENHYDRAMINE HCL 50 MG/ML IJ SOLN: 12.5000 mg | INTRAMUSCULAR | Status: DC | PRN

## 2016-04-09 MED ORDER — POLYVINYL ALCOHOL 1.4 % OP SOLN
1.0000 [drp] | Freq: Four times a day (QID) | OPHTHALMIC | Status: DC | PRN
  Filled 2016-04-09: qty 15

## 2016-04-09 MED ORDER — HALOPERIDOL 0.5 MG PO TABS
0.5000 mg | ORAL_TABLET | ORAL | Status: DC | PRN
  Filled 2016-04-09: qty 1

## 2016-04-09 MED ORDER — HALOPERIDOL LACTATE 2 MG/ML PO CONC
0.5000 mg | ORAL | Status: DC | PRN
  Filled 2016-04-09: qty 0.3

## 2016-04-09 MED ORDER — MORPHINE SULFATE (CONCENTRATE) 10 MG/0.5ML PO SOLN: 10.0000 mg | ORAL | Status: DC | PRN

## 2016-04-09 MED ORDER — LORAZEPAM 1 MG PO TABS: 1.0000 mg | ORAL_TABLET | ORAL | Status: DC | PRN

## 2016-04-09 NOTE — Progress Notes (Signed)
PT Cancellation Note and D/C  Patient Details Name: [name] MRN: [id number] DOB: 1930/05/13   Cancelled Treatment:    Reason Eval/Treat Not Completed: Other (comment) (MD discontinued PT.  Thanks.  )Pt most likely transitioning to comfort care. Signing off.    Irwin Brakeman F 04/09/2016, 12:18 PM [facility] [phone] (pager)

## 2016-04-09 NOTE — Progress Notes (Addendum)
[facility] Progress Note  Patient: [name] [id number]   PCP: [name], MD DOB: 1930-05-15   DOA: 03/25/2016   DOS: 04/09/2016   Date of Service: the patient was seen and examined on 04/09/2016  Brief hospital course: [name] an 82 y.o.malewith H/o atrial fibrillation not on anticoagulation, pacemaker,mod AS, subdural hematoma, diet controlled diabetes, transitional cell carcinoma with intermittent hematuria, s/p chemotherapy, TIA, HTN was discharged from [facility] 9/13 to [facility] for rehab after long hospitalization for new onset seizures and possible brain metastasis noted on CT head, he was seen by Neurology, Neurosurgery and Oncology, couldn't have an MRI due to pacer. Finally plan was for FU with Dr.Shadad in Nov with plan for repeat CT with SRS protocol. Patient was discharged from [facility] on 9/13. Then readmitted with weakness and failure to thrive, noted to have AKI, creatinine of 1.9 from baseline of 1, NA was 126, K was 6.2 CT head 03/31/2016 shows necrotic right frontal mass 2 cm, left occipital mass 12 mm. Neurology, Rad Onc and Oncology following, unable to have MRI. Discussed again on tumor board 10/9 Palliative consulted 10/9: pleuritic chest pain-CTA chest with acute on chronic PE-not anticoagulation candidate 10/10: IVC filter placed 10/11 developing acute encephalopathy, workup shows MRSA sepsis 10/12  CODE STATUS changed to DO NOT RESUSCITATE after discussion with patient's daughter Palliative following, on IV daptomycin, worsening thombocytopenia  Assessment and Plan: 1. MRSA bacteremia with Sepsis -Sepsis associated with bacteremia. -Multifocal nodular lung opacities on CT chest, likely septic emboli -Recently admitted for new onset seizures as well as brain lesions suspicious for metastasis. -Patient developed fever during the hospitalization on 03/31/2016 with acute encephalopathy and further workup noted MRSA  Bacteremia -There is a potential that the brain lesions are also septic emboli although neurology does not feel that the lesion appears to be abscesses. -Unable to obtain MRI due to patient's pacemaker wire not compatible with MRI. -Likely source of infection is central lines with Port-A-Cath as well as right IJ catheter. -Port and [employer]. Removed on 04/04/2016. -Infectious diseases consulting -Not a candidate for pacemaker extraction, appreciate EP consult. -Has been on IV Vanc, this was changed to Daptomycin today 10/18 -PICC line on hold due to ongoing drop in platelets -Palliative care following, daughter understands prognosis is very poor, but wants to continue Full scope of Rx, remains DNR, discussed with [name] again today  2. LUE DVT and Leg and Acute on chronic PEs -Not a candidate for anticoagulation due to thrombocytopenia with brain lesions. -Underwent IVC filter placement on 03/31/2016. - left upper extremity Doppler is positive for DVT, RUE duplex with superficial thrombus only, no DVT -Dr.Patel d/w with interventional radiology, superior vena cava filter do not appear beneficial in this situation.  3. Suspected Metastatic brain lesions/ Seizure disorder -Due to thrombocytopenia and Depakote was changed to Keppra. Repeat EEG negative for any ongoing seizures and therefore continue with 500 mg Keppra per neurology. Continue Decadron for CNS lesions. Oncology as well as radiation oncology both following the patient. With ongoing MRSA sepsis, currently planned [facility] treatment for patient is on hold. There is also a potential that the patient may not need SRS since there is a potential that the brain lesions could be associated with septic emboli rather than metastatic cancer.  4. Thrombocytopenia. Initially was given heparin for DVT prophylaxis which has been on hold. HIT panel negative. Thought to be secondary to Depakote and despite changing to [place] continues to have  thrombocytopenia.  At present my discussion with oncology it appears that this is likely associated with sepsis. With invasive procedures patient is at high risk for bleeding, will need platelet transfusion if <10K or active bleeding, will need platelets before PICC Discussed with oncology and patient can be transfused when necessary for platelets without any contraindication. S/P 1 platelet transfusion on 04/04/2016 for procedure. Platelets down to 9K today, will transfuse 2 more units of platelets   5. Type 2 diabetes mellitus. Steroid-induced hyperglycemia. Continue Lantus continue sliding scale insulin.  6. Constipaton, currently diarrhea Currently resolved  7. Hyponatremia. Likely due to poor oral intake. Improving with IV hydration. Continue to closely monitor.  8. Hyperkalemia. Resolved Continue monitoring. Received Kayexalate.  9. Protein calorie malnutrition. In the setting of ongoing sepsis as well as poor oral intake. Dietary consulted.  10. Goals of care discussion. Palliative care following After discussion with patient's daughter on 04/05/2016 patient's code status is changed to DO NOT RESUSCITATE Dr.Patel discussed with pt's daughter that with extensive DVTs the pt is at high risk of developing life threatening complication. She understand that pt can have a terminal event anytime given multitude of complication at present. -currently plans for continuing full scope of Rx -I updated daughter regarding this situation again  DVT Prophylaxis: mechanical compression device.  Advance goals of care discussion: DO NOT RESUSCITATE DO NOT INTUBATE Family Communication: no family was present at bedside Disposition: Discharge to likely SNF.   Consultants: Palliative care, infectious disease, cardiology Procedures: Echocardiogram  Antibiotics: Anti-infectives    Start     Dose/Rate Route Frequency Ordered Stop   04/08/16 1300  DAPTOmycin (CUBICIN) 700 mg in sodium  chloride 0.9 % IVPB     700 mg 228 mL/hr over 30 Minutes Intravenous Every 24 hours 04/08/16 1207     04/06/16 1400  ceFAZolin (ANCEF) IVPB 2g/100 mL premix  Status:  Discontinued     2 g 200 mL/hr over 30 Minutes Intravenous Every 8 hours 04/06/16 1150 04/06/16 1243   04/06/16 1300  vancomycin (VANCOCIN) 1,250 mg in sodium chloride 0.9 % 250 mL IVPB  Status:  Discontinued     1,250 mg 166.7 mL/hr over 90 Minutes Intravenous Every 24 hours 04/06/16 1243 04/08/16 1140   04/05/16 1200  vancomycin (VANCOCIN) 1,250 mg in sodium chloride 0.9 % 250 mL IVPB  Status:  Discontinued     1,250 mg 166.7 mL/hr over 90 Minutes Intravenous Every 24 hours 04/04/16 1913 04/06/16 1150   04/01/16 1800  cefTRIAXone (ROCEPHIN) 2 g in dextrose 5 % 50 mL IVPB  Status:  Discontinued     2 g 100 mL/hr over 30 Minutes Intravenous Every 12 hours 04/01/16 1717 04/02/16 0810   04/01/16 1800  ampicillin (OMNIPEN) 2 g in sodium chloride 0.9 % 50 mL IVPB  Status:  Discontinued     2 g 150 mL/hr over 20 Minutes Intravenous Every 4 hours 04/01/16 1717 04/02/16 0810   04/01/16 1800  vancomycin (VANCOCIN) IVPB 750 mg/150 ml premix  Status:  Discontinued     750 mg 150 mL/hr over 60 Minutes Intravenous Every 12 hours 04/01/16 1731 04/04/16 1912   04/01/16 1730  vancomycin (VANCOCIN) IVPB 1000 mg/200 mL premix  Status:  Discontinued     1,000 mg 200 mL/hr over 60 Minutes Intravenous  Once 04/01/16 1717 04/01/16 1722     Subjective: feels weak and tired  Objective: Physical Exam: Vitals:   04/09/16 0936 04/09/16 1142 04/09/16 1157 04/09/16 1217  BP: (!) 109/58 120/72 120/72  132/74  Pulse: 61 71 71 79  Resp: 18 16 16 18   Temp: 98.1 F (36.7 C) 98.2 F (36.8 C) 98.2 F (36.8 C) 98.7 F (37.1 C)  TempSrc: Axillary Axillary Axillary Axillary  SpO2: 97% 96% 96% 100%  Weight:      Height:        Intake/Output Summary (Last 24 hours) at 04/09/16 1333 Last data filed at 04/09/16 1217  Gross per 24 hour  Intake               910 ml  Output              400 ml  Net              510 ml   Filed Weights   03/25/16 1022 03/25/16 2036 03/30/16 0550  Weight: 86.6 kg (191 lb) 79.3 kg (174 lb 14.4 oz) 85.9 kg (189 lb 6 oz)    General: chronically ill appearing, no distress, somnolent Eyes: PERRL, Conjunctiva normal ENT: Oral Mucosa clear moist. Neck: NO JVD, NO Abnormal Mass Or lumps Cardiovascular: S1 and S2 Present, aortic systolic Murmur, Respiratory: Bilateral Air entry equal and Decreased, no use of accessory muscle, Clear to Auscultation, no Crackles, no wheezes Abdomen: Bowel Sound present, Soft and no tenderness Skin: no redness, diffuse Rash, no induration Extremities: bilateral Pedal edema, no calf tenderness, bilateral lower extremity swelling Neurologic: Grossly no focal neuro deficit. Bilaterally Equal motor strength  Data Reviewed: CBC:  Recent Labs Lab 04/04/16 0627 04/05/16 0647 04/06/16 0546 04/07/16 0147 04/08/16 0733 04/09/16 0507  WBC 3.8* 3.7* 3.8* 3.9* 4.1 4.3  NEUTROABS 3.1 3.2 3.0 3.2 3.3  --   HGB 10.7* 12.1* 11.5* 11.2* 11.7* 11.1*  HCT 30.7* 34.1* 32.5* 31.0* 33.3* 32.2*  MCV 102.0* 101.2* 100.3* 99.4 100.6* 101.3*  PLT 44* 43* 29* 21* 15* 9*   Basic Metabolic Panel:  Recent Labs Lab 04/03/16 0544 04/04/16 0627 04/05/16 0647 04/06/16 0546 04/07/16 0147 04/08/16 0733 04/09/16 0507  NA 128* 132* 133*  --  132* 132* 133*  K 4.9 4.1 4.0  --  4.5 4.6 4.8  CL 91* 98* 97*  --  95* 95* 96*  CO2 29 25 26   --  27 30 30   GLUCOSE 284* 105* 231*  --  255* 234* 299*  BUN 27* 28* 31*  --  38* 40* 39*  CREATININE 1.20 0.87 0.92  --  0.89 0.89 0.94  CALCIUM 8.4* 7.9* 8.0*  --  8.3* 8.7* 8.5*  MG 1.7  --   --  1.6*  --  1.7  --     Liver Function Tests:  Recent Labs Lab 04/03/16 0544 04/04/16 0627  AST 12* 15  ALT 14* 12*  ALKPHOS 81 80  BILITOT 0.8 1.2  PROT 4.5* 4.2*  ALBUMIN 1.6* 1.6*   No results for input(s): LIPASE, AMYLASE in the last 168 hours. No  results for input(s): AMMONIA in the last 168 hours. Coagulation Profile:  Recent Labs Lab 04/04/16 0627  INR 1.08   Cardiac Enzymes:  Recent Labs Lab 04/07/16 0147 04/07/16 0556 04/07/16 1541 04/09/16 0507  CKTOTAL  --   --   --  10*  TROPONINI 0.04* 0.04* 0.09*  --    BNP (last 3 results) No results for input(s): PROBNP in the last 8760 hours.  CBG:  Recent Labs Lab 04/08/16 1204 04/08/16 1726 04/08/16 2239 04/09/16 0841 04/09/16 1154  GLUCAP 223* 264* 241* 304* 267*    Studies: No  results found.   Scheduled Meds: . acetaminophen  1,000 mg Oral Q8H  . DAPTOmycin (CUBICIN)  IV  700 mg Intravenous Q24H  . dexamethasone  4 mg Oral Daily  . feeding supplement (GLUCERNA SHAKE)  237 mL Oral BID BM  . feeding supplement (PRO-STAT SUGAR FREE 64)  30 mL Oral BID  . folic acid  1 mg Oral Daily  . insulin aspart  0-15 Units Subcutaneous TID WC  . insulin aspart  0-5 Units Subcutaneous QHS  . insulin glargine  5 Units Subcutaneous QHS  . levETIRAcetam  500 mg Oral BID  . mouth rinse  15 mL Mouth Rinse BID  . pantoprazole  40 mg Oral Daily  . saccharomyces boulardii  250 mg Oral BID  . sodium chloride flush  3 mL Intravenous Q12H  . vitamin B-12  1,000 mcg Oral Daily   Continuous Infusions:   PRN Meds: acetaminophen, ALPRAZolam, chlorhexidine, lidocaine, morphine injection, ondansetron **OR** ondansetron (ZOFRAN) IV, polyethylene glycol, prochlorperazine, sodium chloride flush  Time spent: 30 minutes  Domenic Polite, MD [phone] 04/09/2016 1:33 PM  If 7PM-7AM, please contact night-coverage at [email or web address], password [facility]

## 2016-04-09 NOTE — Progress Notes (Signed)
OT Cancellation Note  Patient Details Name: [name] MRN: [id number] DOB: 02-Apr-1930   Cancelled Treatment:    Reason Eval/Treat Not Completed: Medical issues which prohibited therapy.  Pt with platletts of 9 and delerious.  Spoke with MD, pt most appropriate for Hospice services at this time and is working with family on this.   [place], OTR/L [id number]   Lucille Passy M 04/09/2016, 11:41 AM

## 2016-04-09 NOTE — Progress Notes (Addendum)
Per palliative team pt dtr now agreeable to Residential Hospice- prefers [facility]  Referral made to [place] for review  Jorge Ny, [place] Social Worker [phone]

## 2016-04-09 NOTE — Progress Notes (Addendum)
Daily Progress Note   Patient Name: [name]       Date: 04/09/2016 DOB: 1929-07-29  Age: 82 y.o. MRN#: [id number] Attending Physician: Domenic Polite, MD Primary Care Physician: [name], MD Admit Date: 03/25/2016  Reason for Consultation/Follow-up: Establishing goals of care  Subjective: Evaluated patient. He was grimacing, and moaning at times. Daughter, [name] stated he was groggy so she didn't want him to have pain medications. Found private room to meet with [name] for further discussion. Dr. Broadus John had met previously and again educated her re: patient's poor prognosis, possibility of terminal event at any moment d/t multiple DVT's, chronic PE's, brain mets from ca, and MRSA bacteremia. Gave [name] emotional support and allowed her to vent her frustration with this situation. Encouraged [name] to reframe her goals for patient to include allowing him comfort. Reviewed dying process with her and that death doesn't happen in a moment it is a process the body goes through over times. Reviewing patient's history and declines noted that patient's body has likely been dying for sometime now, and current interventions are not only prolonging death, but are also prolonging suffering. [name] noted her mother died in 02-Sep-1995 from cancer "because she was put on a morphine drip, and that's what you want to do to my Dad, just push him out of here". Reviewed with [name] that her Mom died from cancer, and that morphine infusion was likely necessary because cancer can be a very painful death. Discussed fact that often in end of life situations, the amount or type of medication required for comfort can cause sedation, however, the intent is not to speed death, rather to provide patient with comfort until  they die from their natural causes. At close of conversation, [name] agreed she did not want her Dad to suffer, she desires transition to comfort care and placement at residential Hospice facility if possible.  Review of Systems  All other systems reviewed and are negative.   Length of Stay: 14  Current Medications: Scheduled Meds:  . acetaminophen  1,000 mg Oral Q8H  . levETIRAcetam  500 mg Oral BID  . mouth rinse  15 mL Mouth Rinse BID  . pantoprazole  40 mg Oral Daily    Continuous Infusions:    PRN Meds: acetaminophen **OR** acetaminophen, acetaminophen, ALPRAZolam, antiseptic oral rinse,  chlorhexidine, diphenhydrAMINE, glycopyrrolate **OR** glycopyrrolate **OR** glycopyrrolate, haloperidol **OR** haloperidol **OR** haloperidol lactate, lidocaine, LORazepam **OR** LORazepam **OR** LORazepam, morphine injection, morphine CONCENTRATE **OR** morphine CONCENTRATE, ondansetron **OR** ondansetron (ZOFRAN) IV, ondansetron **OR** ondansetron (ZOFRAN) IV, polyvinyl alcohol  Physical Exam  Constitutional:  Cachexic, appears uncomfortable  HENT:  Dried blood noted at nose  Cardiovascular: Normal rate and regular rhythm.   Pulmonary/Chest: Effort normal.  diminished  Abdominal: Soft. Bowel sounds are normal. He exhibits no distension. There is no tenderness.  Musculoskeletal: He exhibits edema.  Neurological:  HOH, not oriented to place or situation, somnolent- arouses for a few moments, grimacing, moaning  Skin:  BUE red, weeping in some places  Nursing note and vitals reviewed.           Vital Signs: BP (!) 127/59   Pulse 73   Temp 97.3 F (36.3 C) (Axillary)   Resp 16   Ht _0  (1.753 m)   Wt 85.9 kg (189 lb 6 oz)   SpO2 98%   BMI 27.97 kg/m  SpO2: SpO2: 98 % O2 Device: O2 Device: Not Delivered O2 Flow Rate: O2 Flow Rate (L/min): 2 L/min  Intake/output summary:  Intake/Output Summary (Last 24 hours) at 04/09/16 1453 Last data filed at 04/09/16 1441  Gross per 24  hour  Intake              830 ml  Output              150 ml  Net              680 ml   LBM: Last BM Date: 04/08/16 Baseline Weight: Weight: 86.6 kg (191 lb) Most recent weight: Weight: 85.9 kg (189 lb 6 oz)       Palliative Assessment/Data: PPS: 10%    Flowsheet Rows   Flowsheet Row Most Recent Value  Intake Tab  Referral Department  Hospitalist  Unit at Time of Referral  Other (Comment)  Palliative Care Primary Diagnosis  Cancer  Date Notified  03/30/16  Palliative Care Type  New Palliative care  Reason for referral  Clarify Goals of Care, Counsel Regarding Hospice  Date of Admission  03/25/16  Date first seen by Palliative Care  03/30/16  # of days Palliative referral response time  0 Day(s)  # of days IP prior to Palliative referral  5  Clinical Assessment  Palliative Performance Scale Score  30%  Psychosocial & Spiritual Assessment  Palliative Care Outcomes  Patient/Family meeting held?  Yes  Who was at the meeting?  [name] and 1 other dtr, 2 grandsons  Palliative Care Outcomes  Improved pain interventions, Other (Comment)      Patient Active Problem List   Diagnosis Date Noted  . Palliative care by specialist   . MRSA bacteremia 04/02/2016  . PE (pulmonary thromboembolism) ([place])   . Acute encephalopathy   . Mass   . Catheter-related bloodstream infection   . Staphylococcus aureus bacteremia with sepsis ([place])   . DIC (disseminated intravascular coagulation) ([place])   . Brain metastases ([place])   . Goals of care, counseling/discussion   . Acute on chronic alteration in mental status   . Palliative care encounter   . DNR (do not resuscitate) discussion   . Hyperglycemia 03/25/2016  . Brain lesion 03/25/2016  . AKI (acute kidney injury) ([place]) 03/25/2016  . Hyperkalemia 03/25/2016  . Hydronephrosis 03/25/2016  . Dehydration   . Hydronephrosis due to obstruction of ureter   . Elevated troponin   .  Seizure ([place])   . Abnormal brain CT 03/03/2016  . Hypokalemia    . Seizures ([place]) 02/29/2016  . Syncope 02/28/2016  . Port catheter in place 02/21/2016  . Neutropenic fever ([place])   . Malnutrition of moderate degree 08/28/2015  . Diet-controlled diabetes mellitus ([place])   . Coronary artery disease 08/27/2015  . Thrombocytopenia ([place]) 08/27/2015  . Hematuria 08/27/2015  . Pancytopenia ([place]) 08/27/2015  . Sepsis ([place])   . 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) 07/30/2015  . Chest pain 07/17/2014  . Aortic stenosis 04/29/2013  . Thrombocytosis ([place]) 03/30/2013  . Abnormal urine cytology 05/02/2012  . Acute pulmonary edema ([place]) 10/04/2011  . Alcohol withdrawal delirium ([place]) 10/04/2011  . TIA (transient ischemic attack) 10/03/2011  . Subdural hematoma ([place]) 10/02/2011  . ETOH abuse 10/02/2011  . Permanent atrial fibrillation ([place]) 10/02/2011  . UTI (lower urinary tract infection) 10/02/2011  . Elevated PSA 05/19/2011  . Pacemaker 05/19/2011  . Diabetes mellitus ([place]) 05/19/2011  . Hypertension 05/19/2011  . 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 He was recently hospitalized from 9/8-9/13 for new onset-seizures and found to have a left occipital lobe mass and a right frontal lobe mass on CT head. He is unable to have MRI due to his pacemaker leads.  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  He developed chest pain and was found to have bilateral multiple PEs.  He is not a candidate for anticoagulation due to SDH and thrombocytopenia.  An IVC filter was placed on 10/10.   He is not a candidate for anticoagulation.  The next step is a one time  SRS procedure thru radiation oncology that will palliate the brain mets, the primary cancer will still likely take his life at some point. Suspicion of infection at pacemaker, but not candidate for pacemaker removal. Plan is to continue IV antibiotics for 6 weeks, then transition to lifetime oral suppression, however, PICC insertion complicated by thrombocytopenia and multiple DVT's.  Assessment/Recommendations/Plan   Pt now with declining mental status and likely actively dying  Comfort care --Pain/SOB:        -Morphine 68m IV q2hr prn  -Morphine intensol (11m0.5mL) 1055mL q1hr prn pain or SOB --Anxiety/Agitation:  -lorazepam 1mg14m or IV q 4 hours prn  -haloperidol .5mg 60m IV, SL q 4hrs prn --Pulmonary edema/Audible Terminal Secretions:  -Robinul 1 mg po or .2mg I13mr SL q 4 hrs prn --Stop IV fluids D/C Telemetry monitoring --Stop all meds that do not focus on comfort  --Consult to SW for residential hospice placement     Goals of Care and Additional Recommendations:  Limitations on Scope of Treatment: Full Comfort Care, Minimize Medications, Full Scope Treatment, Initiate Comfort Feeding, No Artificial Feeding, No Blood Transfusions, No Chemotherapy, No Diagnostics, No Glucose Monitoring, No Hemodialysis, No IV Antibiotics, No IV Fluids, No Lab Draws, No Radiation, No Surgical Procedures and No Tracheostomy  Code Status:  DNR  Prognosis:   < 2 weeks d/t rapidly declining status, family desire for comfort measures only  Discharge Planning:  Hospice facility pending Hospice eval and bed availability  Care plan was discussed with daughter, SherriVenida JarvisJosephBroadus John  Thank you for allowing the Palliative Medicine Team to assist in the care of this patient.   Time In: 1345 Time Out: 1445 Total Time 60 mins Prolonged Time Billed No       Greater than 50%  of this time was spent counseling and coordinating care related to the above assessment and plan.  Mariana Kaufman,  AGNP-C Palliative Medicine   Please contact Palliative Medicine Team phone at [phone] for questions and concerns.

## 2016-04-09 NOTE — Progress Notes (Signed)
Inpatient Diabetes Program Recommendations  AACE/ADA: New Consensus Statement on Inpatient Glycemic Control (2015)  Target Ranges:  Prepandial:   less than 140 mg/dL      Peak postprandial:   less than 180 mg/dL (1-2 hours)      Critically ill patients:  140 - 180 mg/dL   Lab Results  Component Value Date   GLUCAP 267 (H) 04/09/2016   HGBA1C 7.9 (H) 03/25/2016    Review of Glycemic Control   Results for [name], [name] (MRN [id number]) as of 04/09/2016 13:25  Ref. Range 04/08/2016 12:04 04/08/2016 17:26 04/08/2016 22:39 04/09/2016 08:41 04/09/2016 11:54  Glucose-Capillary Latest Ref Range: 65 - 99 mg/dL 223 (H) 264 (H) 241 (H) 304 (H) 267 (H)    Inpatient Diabetes Program Recommendations:    Increase Lantus to 10 units QHS. Add meal coverage - Novolog 3 units tidwc  Thank you. Lorenda Peck, RD, LDN, CDE Inpatient Diabetes Coordinator [phone]

## 2016-04-09 NOTE — Consult Note (Signed)
[facility] [employer] Received request from [place] for family interest in [facility]. Chart reviewed and spoke with daughter [name] by phone to confirm interest and offer to answer questions. [name] was very emotional at that time and did not have any questions. [name] is aware [employer] is not able offer a room today and that someone will follow up tomorrow. Made CSW [place] aware of the above with plan to update Jenna tomorrow morning.   Thank you,  Erling Conte, LCSW [phone]

## 2016-04-10 LAB — PREPARE PLATELET PHERESIS
UNIT DIVISION: 0
UNIT DIVISION: 0

## 2016-04-10 LAB — CULTURE, BLOOD (ROUTINE X 2)
CULTURE: NO GROWTH
Culture: NO GROWTH

## 2016-04-10 MED ORDER — ACETAMINOPHEN 325 MG PO TABS: 650.0000 mg | ORAL_TABLET | Freq: Four times a day (QID) | ORAL | Status: AC | PRN

## 2016-04-10 MED ORDER — GLYCOPYRROLATE 1 MG PO TABS: 1.0000 mg | ORAL_TABLET | ORAL | Status: AC | PRN

## 2016-04-10 MED ORDER — MORPHINE SULFATE (CONCENTRATE) 10 MG/0.5ML PO SOLN: 10.0000 mg | ORAL | 0 refills | Status: AC | PRN

## 2016-04-10 MED ORDER — LORAZEPAM 2 MG/ML PO CONC: 1.0000 mg | ORAL | 0 refills | Status: AC | PRN

## 2016-04-10 MED ORDER — HALOPERIDOL LACTATE 2 MG/ML PO CONC: 0.5000 mg | ORAL | 0 refills | Status: AC | PRN

## 2016-04-10 NOTE — Progress Notes (Signed)
Nsg Discharge Note  Admit Date:  03/25/2016 Discharge date: 04/10/2016   [name] to be D/C'd [name] per MD order.  AVS completed.  Copy for chart, and copy for patient signed, and dated. Patient/caregiver able to verbalize understanding.  Discharge Medication:   Medication List    STOP taking these medications   amLODipine 10 MG tablet Commonly known as:  NORVASC   clopidogrel 75 MG tablet Commonly known as:  PLAVIX   dexamethasone 4 MG tablet Commonly known as:  DECADRON   divalproex 500 MG DR tablet Commonly known as:  DEPAKOTE   insulin aspart 100 UNIT/ML injection Commonly known as:  novoLOG   oxybutynin 5 MG tablet Commonly known as:  DITROPAN   pantoprazole 40 MG tablet Commonly known as:  PROTONIX     TAKE these medications   acetaminophen 325 MG tablet Commonly known as:  TYLENOL Take 2 tablets (650 mg total) by mouth every 6 (six) hours as needed for mild pain (or Fever >/= 101). What changed:  medication strength  how much to take  when to take this  reasons to take this   ALPRAZolam 0.5 MG tablet Commonly known as:  XANAX Take 0.5 mg by mouth at bedtime as needed for sleep.   glycopyrrolate 1 MG tablet Commonly known as:  ROBINUL Take 1 tablet (1 mg total) by mouth every 4 (four) hours as needed (excessive secretions).   haloperidol 2 MG/ML solution Commonly known as:  HALDOL Place 0.3 mLs (0.6 mg total) under the tongue every 4 (four) hours as needed for agitation (or delirium).   LORazepam 2 MG/ML concentrated solution Commonly known as:  ATIVAN Place 0.5 mLs (1 mg total) under the tongue every 4 (four) hours as needed for anxiety.   mineral oil-hydrophilic petrolatum ointment Apply topically 2 (two) times daily. What changed:  how much to take  when to take this   morphine CONCENTRATE 10 MG/0.5ML Soln concentrated solution Place 0.5 mLs (10 mg total) under the tongue every 2 (two) hours as needed for moderate pain  or shortness of breath (or dyspnea).   polyethylene glycol packet Commonly known as:  MIRALAX / GLYCOLAX Take 17 g by mouth daily. What changed:  when to take this  reasons to take this   prochlorperazine 10 MG tablet Commonly known as:  COMPAZINE Take 10 mg by mouth every 6 (six) hours as needed for nausea or vomiting. Reported on 01/10/2016       Discharge Assessment: Vitals:   04/10/16 0000 04/10/16 0503  BP: 131/74 128/65  Pulse: 82 (!) 102  Resp: 16 18  Temp: 98.2 F (36.8 C) 97.9 F (36.6 C)   Skin clean, dry and intact without evidence of skin break down, no evidence of skin tears noted. IV catheter discontinued intact. Site without signs and symptoms of complications - no redness or edema noted at insertion site, patient denies c/o pain - only slight tenderness at site.  Dressing with slight pressure applied.  D/c Instructions-Education: Discharge instructions given to patient/family with verbalized understanding. D/c education completed with patient/family including follow up instructions, medication list, d/c activities limitations if indicated, with other d/c instructions as indicated by MD - patient able to verbalize understanding, all questions fully answered. Patient instructed to return to ED, call 911, or call MD for any changes in condition.  Patient transported via [facility] to [place] given to [name] at [employer].  Salley Slaughter, RN 04/10/2016 2:02 PM

## 2016-04-10 NOTE — Progress Notes (Signed)
Patient will discharge to [facility] Anticipated discharge date: 10/20 Family notified: pt dtr [name] [employer] by [employer]- called at 1:15pm  CSW signing off.  Jorge Ny, LCSW Clinical Social Worker [phone]

## 2016-04-10 NOTE — Discharge Summary (Signed)
Physician Discharge Summary  RODERICH COCKCROFT [id number] DOB: 02-02-1930 DOA: 03/25/2016  PCP: [name], MD  Admit date: 03/25/2016 Discharge date: 04/10/2016  Time spent: 45 minutes  Recommendations for Outpatient Follow-up:  1. 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[place])   Port catheter in place   Seizures ([place])   Brain lesion   AKI (acute kidney injury) ([place])   Hyperkalemia   Hydronephrosis   Palliative care encounter   Goals of care, counseling/discussion   PE (pulmonary thromboembolism) ([place])   Acute encephalopathy   Mass   Catheter-related bloodstream infection   Staphylococcus aureus bacteremia with sepsis ([place])   DIC (disseminated intravascular coagulation) ([place])   Suspected Brain metastases ([place])   Palliative care by specialist   Terminal care   Discharge Condition: poor  Diet recommendation: comfort feeds  Filed Weights   03/25/16 1022 03/25/16 2036 03/30/16 0550  Weight: 86.6 kg (191 lb) 79.3 kg (174 lb 14.4 oz) 85.9 kg (189 lb 6 oz)    History of present illness:  MITHUN GOLDSWORTHY an 82 y.o.malewith H/o atrial fibrillation not on anticoagulation, pacemaker,mod AS, subdural hematoma, diet controlled diabetes, transitional cell carcinoma with intermittent hematuria, s/p chemotherapy, TIA, HTN was discharged from [facility] 9/13 to [facility] for rehab after long hospitalization for new onset seizures and possible brain metastasis noted on CT head, he was seen by Neurology, Neurosurgery and Oncology, couldn't have an MRI due to pacer. Finally plan was for FU with Dr.Shadad in Nov with plan for repeat CT with SRS protocol. Patient was discharged from [facility] on 9/13. Then readmitted with weakness and failure to thrive  Hospital Course:  1. MRSA bacteremia with Sepsis -Sepsis associated  with bacteremia. -Multifocal nodular lung opacities on CT chest, likely septic emboli -Recently admitted for new onset seizures as well as brain lesions suspicious for metastasis. -He then developed fever during the hospitalization on 03/31/2016 with acute encephalopathy and further workup noted MRSA Bacteremia -Likely source of infection is central lines with Port-A-Cath as well as right IJ catheter. -Port and [employer]. Removed on 04/04/2016. -Infectious diseases consulted, had been on IV Vanc, this was changed to Daptomycin on 10/18 -due to acute PE and multiple DVTs and ongoing failure to thrive with numerous medical issues, Palliative has been following this patient and finally decision was made for Comfort care and residential hospice yesterday, he will be discharged to [facility] for end of life care  2. LUE DVT and Leg and Acute on chronic PEs -Not a candidate for anticoagulation due to thrombocytopenia with brain lesions. -Underwent IVC filter placement on 03/31/2016. - left upper extremity Doppler is positive for DVT, RUE duplex with superficial thrombus only, no DVT -DVts noted ib both lower legs too  3. Suspected Metastatic brain lesions/ Seizure disorder Oncology as well as radiation oncology both were following the patient. With ongoing MRSA sepsis, currently planned [facility] treatment for patient is on hold. There is also a potential that the patient may not need SRS since there is a potential that the brain lesions could be associated with septic emboli rather than metastatic cancer. -now comfort care  4. Thrombocytopenia. Initially was given heparin for DVT prophylaxis which has been on hold. HIT panel negative. Thought to be secondary to Depakote and despite changing to [place] continues to have thrombocytopenia. At  present my discussion with oncology it appears that this is likely associated with sepsis. Has received platelet transfusions, last on 10/19 -now comfort  care  5. Type 2 diabetes mellitus.  6. Constipaton, currently diarrhea  7. Hyponatremia.  8. Hyperkalemia. Resolved  9. Severe Protein calorie malnutrition.   Discharge Exam: Vitals:   04/10/16 0000 04/10/16 0503  BP: 131/74 128/65  Pulse: 82 (!) 102  Resp: 16 18  Temp: 98.2 F (36.8 C) 97.9 F (36.6 C)    General: obtunded, minimally responsive Cardiovascular: S1s2/RRR Respiratory: Decreased BS at bases  Discharge Instructions   Discharge Instructions    Discharge instructions    Complete by:  As directed    Comfort measures, comfort feeds   Increase activity slowly    Complete by:  As directed      Current Discharge Medication List    START taking these medications   Details  glycopyrrolate (ROBINUL) 1 MG tablet Take 1 tablet (1 mg total) by mouth every 4 (four) hours as needed (excessive secretions).    haloperidol (HALDOL) 2 MG/ML solution Place 0.3 mLs (0.6 mg total) under the tongue every 4 (four) hours as needed for agitation (or delirium). Qty: 15 mL, Refills: 0    LORazepam (ATIVAN) 2 MG/ML concentrated solution Place 0.5 mLs (1 mg total) under the tongue every 4 (four) hours as needed for anxiety. Qty: 30 mL, Refills: 0    Morphine Sulfate (MORPHINE CONCENTRATE) 10 MG/0.5ML SOLN concentrated solution Place 0.5 mLs (10 mg total) under the tongue every 2 (two) hours as needed for moderate pain or shortness of breath (or dyspnea). Qty: 30 mL, Refills: 0      CONTINUE these medications which have CHANGED   Details  acetaminophen (TYLENOL) 325 MG tablet Take 2 tablets (650 mg total) by mouth every 6 (six) hours as needed for mild pain (or Fever >/= 101).      CONTINUE these medications which have NOT CHANGED   Details  ALPRAZolam (XANAX) 0.5 MG tablet Take 0.5 mg by mouth at bedtime as needed for sleep.     mineral oil-hydrophilic petrolatum (AQUAPHOR) ointment Apply topically 2 (two) times daily. Qty: 420 g, Refills: 0    polyethylene  glycol (MIRALAX / GLYCOLAX) packet Take 17 g by mouth daily.    prochlorperazine (COMPAZINE) 10 MG tablet Take 10 mg by mouth every 6 (six) hours as needed for nausea or vomiting. Reported on 01/10/2016      STOP taking these medications     amLODipine (NORVASC) 10 MG tablet      clopidogrel (PLAVIX) 75 MG tablet      dexamethasone (DECADRON) 4 MG tablet      divalproex (DEPAKOTE) 500 MG DR tablet      insulin aspart (NOVOLOG) 100 UNIT/ML injection      pantoprazole (PROTONIX) 40 MG tablet      oxybutynin (DITROPAN) 5 MG tablet        Allergies  Allergen Reactions  . Codeine Other (See Comments)    Blisters between fingers  . Docetaxel Rash      The results of significant diagnostics from this hospitalization (including imaging, microbiology, ancillary and laboratory) are listed below for reference.    Significant Diagnostic Studies: Ct Head Wo Contrast  Result Date: 04/01/2016 CLINICAL DATA:  Acute on chronic altered mental status. EXAM: CT HEAD WITHOUT CONTRAST TECHNIQUE: Contiguous axial images were obtained from the base of the skull through the vertex without intravenous contrast. COMPARISON:  Yesterday FINDINGS: Brain: Motion  degraded, best obtainable in the circumstances. Isodense parasagittal right frontal brain mass and hyperdense left occipital mass are stable without contrast. No acute hemorrhage, hydrocephalus, or evidence of acute infarct. 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 Vascular: Atherosclerotic calcification. Skull: No acute finding.  Previous left craniotomy. Sinuses/Orbits: Right otomastoiditis, similar to previous. No coalescing features. Clear nasopharynx. Mild sinusitis. IMPRESSION: 1. Stable motion degraded exam.  No acute intracranial finding. 2. Known right frontal and left occipital masses. 3. Chronic right otomastoiditis. Electronically Signed   By: Monte Fantasia M.D.   On: 04/01/2016 18:44   Ct  Head W & Wo Contrast  Result Date: 03/31/2016 CLINICAL DATA:  Followup brain lesion. Acute presentation with weakness. EXAM: CT HEAD WITHOUT AND WITH CONTRAST TECHNIQUE: Contiguous axial images were obtained from the base of the skull through the vertex without and with intravenous contrast CONTRAST:  59mL ISOVUE-300 IOPAMIDOL (ISOVUE-300) INJECTION 61%<Contrast>14mL ISOVUE-300 IOPAMIDOL (ISOVUE-300) INJECTION 61% COMPARISON:  03/25/2016 an multiple previous FINDINGS: Brain: 2 brain lesions are identified. No abnormality seen affecting the brainstem or cerebellum. Within the left posterior temporal/occipital region, there is an enhancing mass measuring 12 mm cephalo caudal with a transverse diameter of 8 mm consistent with a metastasis. Within the right medial frontal lobe, there is a 2.1 cm by 2.2 x 1.8 mass with areas of cystic degeneration. These are consistent with metastatic lesions. No other lesion is identified by CT. Punctate focus of cortical calcification in the right parietal region is unchanged. Old left frontal cortical and subcortical infarction is unchanged. Chronic small-vessel ischemic changes affect the white matter. No hydrocephalus. No extra-axial collection. Vascular: No primary vascular lesion. Major vessels show flow. There is atherosclerotic calcification of the major vessels at the base of the brain. Skull: Previous left frontoparietal craniotomy. Sinuses/Orbits: Clear Other: None significant IMPRESSION: Two brain masses identified consist with metastatic disease. Partially necrotic right frontal mass measuring 2.1 x 2.2 x 1.8 cm. Left posterior temporal/ occipital mass measuring 12 x 8 x 8 mm. Electronically Signed   By: Nelson Chimes M.D.   On: 03/31/2016 10:13   Ct Head W & Wo Contrast  Result Date: 03/25/2016 CLINICAL DATA:  82 y/o  M; weakness. EXAM: CT HEAD WITHOUT AND WITH CONTRAST TECHNIQUE: Contiguous axial images were obtained from the base of the skull through the vertex  without and with intravenous contrast CONTRAST:  42mL ISOVUE-300 IOPAMIDOL (ISOVUE-300) INJECTION 61%<Contrast>31mL ISOVUE-300 IOPAMIDOL (ISOVUE-300) INJECTION 61% COMPARISON:  CT of head with contrast dated 02/28/2016 FINDINGS: Brain: Stable left inferior frontal encephalomalacia. Stable hyperdense focus in the left occipital lobe measuring 10 mm (series 2, image 14). Postcontrast administration the lesion appears to enhance only on the coronal reconstruction (compare series 8, image 33 with series 6, image 54). Additionally, there is a probable new focus of enhancement in the right paramedian frontal lobe (series 4, image 15) No evidence for large territory infarct or new intracranial hemorrhage. Stable mild to moderate chronic microvascular ischemic changes and parenchymal volume loss. Vascular: Calcific atherosclerosis of carotid siphons. Skull: Postsurgical changes related to a left frontal craniotomy are stable. All Sinuses/Orbits: Right mastoid opacification with sclerosis compatible with sequelae of chronic otomastoiditis, stable. Mild paranasal sinus disease with sphenoid and ethmoid patchy opacification. Left mastoid air cells are clear. Orbits are unremarkable. Other: Right parotidectomy. IMPRESSION: 1. No evidence of large territory infarct or new intracranial hemorrhage. 2. Probable new focus of enhancement within the right paramedian frontal lobe. Differential includes metastatic disease or  subacute infarction. 3. Previous identified by right frontal parafalcine hyperdense focus is no longer appreciated, possibly having represented an area of hemorrhage. 4. Left occipital hyperdense focus with uncertain enhancement. Stable in size in comparison with prior studies. Electronically Signed   By: Kristine Garbe M.D.   On: 03/25/2016 17:29   Ct Angio Chest Pe W Or Wo Contrast  Result Date: 03/30/2016 CLINICAL DATA:  Chest pain and shortness of breath. EXAM: CT ANGIOGRAPHY CHEST WITH CONTRAST  TECHNIQUE: Multidetector CT imaging of the chest was performed using the standard protocol during bolus administration of intravenous contrast. Multiplanar CT image reconstructions and MIPs were obtained to evaluate the vascular anatomy. CONTRAST:  100 cc Isovue 370 intravenous COMPARISON:  03/01/2016 FINDINGS: Cardiovascular: Study is optimized for the pulmonary arteries and is good quality. There are multiple pulmonary artery filling defects bilaterally, including subsegmental branches in the left lower lobe (6:112 and 134) and in the right middle and lower lobes. Some of these emboli appear acute, especially in the right lower lobe anterior basilar segment 6:149, but elsewhere there are subacute to chronic features, especially proximal segmental branches of the right lower lobe 6:126. Normal RV to LV ratio of 60%. [place] in the right ventricle are prominent, without convincing superimposed thrombus. Cardiomegaly. No pericardial effusion. Single chamber pacer into the right ventricular apex. Atherosclerotic calcification, including the coronary arteries. Mild aortic valve calcification. Mediastinum/Nodes: Negative for adenopathy. Lungs/Pleura: New apical predominant nodular opacities with indistinct appearance. Dominant nodule in the left upper lobe, 5:15, measuring up to 12 mm. Chronic microcalcification or aspirated barium in the dependent right lower lobe Upper Abdomen: Cholelithiasis.  No acute finding. Musculoskeletal: T5 vertebral body fracture that is oblique. The fracture is subacute based on previous study, new from 11/05/2015. Fracture is nondisplaced and does not appear to continue through the posterior elements. There is diffuse ankylosis of the thoracic spine. Critical Value/emergent results were called by telephone at the time of interpretation on 03/30/2016 at 12:31 pm to Dr. Domenic Polite , who verbally acknowledged these results. Review of the MIP images confirms the above findings. IMPRESSION:  1. Multifocal pulmonary emboli ranging from acute to subacute/chronic, segmental to subsegmental size. Dilated pulmonary arterial tree suggest pulmonary hypertension. No indication of acute right heart strain. 2. Nondisplaced subacute T5 body fracture without visible continuation through the posterior elements. 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 3. New multi focal nodular lung opacities. Ill-defined appearance and development over 1 month suggests atypical, potentially hematogenous infection. Metastases are not excluded and close follow-up is recommended in this patient with urothelial cancer. Electronically Signed   By: Monte Fantasia M.D.   On: 03/30/2016 12:32   Ir Ivc Filter Plmt / S&i /img Guid/mod Sed  Result Date: 03/31/2016 CLINICAL DATA:  Acute pulmonary emboli. Metastatic urothelial carcinoma. Brain metastases, a relative contraindication to anticoagulation. Caval filtration requested. EXAM: INFERIOR VENACAVOGRAM IVC FILTER PLACEMENT UNDER FLUOROSCOPY FLUOROSCOPY TIME:  36 seconds, 34 mGy TECHNIQUE: The procedure, risks (including but not limited to bleeding, infection, organ damage ), benefits, and alternatives were explained to the patient. Questions regarding the procedure were encouraged and answered. The patient understands and consents to the procedure. Patency of the right IJ vein was confirmed with ultrasound with image documentation. An appropriate skin site was determined. Skin site was marked, prepped with chlorhexidine, and draped using maximum barrier technique. The region was infiltrated locally with 1% lidocaine. Under real-time ultrasound guidance, the right IJ vein was accessed with a 21 gauge micropuncture  needle; the needle tip within the vein was confirmed with ultrasound image documentation. The needle was exchanged over a 018 guidewire for a transitional dilator. Intravenous Fentanyl and Versed were administered as  conscious sedation during continuous monitoring of the patient's level of consciousness and physiological / cardiorespiratory status by the radiology RN, with a total moderate sedation time of 10 minutes. A Benson wire was advanced through the transitional dilator into the IVC. A long 6 French vascular sheath was placed for inferior venacavography. This demonstrated no caval thrombus. Renal vein inflows were evident. The TrapEase IVC filter was advanced through the sheath and successfully deployed under fluoroscopy at the L2-3 level. Followup cavagram demonstrates stable filter position and no evident complication. The sheath was exchanged for central venous catheter at the site (dictated separately). No immediate complication. IMPRESSION: 1. Normal IVC. No thrombus or significant anatomic variation. 2. Technically successful infrarenal IVC filter placement. Electronically Signed   By: Lucrezia Europe M.D.   On: 03/31/2016 15:36   US Renal  Result Date: 03/25/2016 CLINICAL DATA:  Hydronephrosis due to obstruction of the ureter. Patient with history of urothelial carcinoma treated with chemotherapy most recently November 27, 2015. Noted to have mild right hydroureteronephrosis to the level of the proximal right ureter on prior CT of 03/01/2016. EXAM: RENAL / URINARY TRACT ULTRASOUND COMPLETE COMPARISON:  CT 03/01/2016 FINDINGS: Right Kidney: Length: 9.7 cm. Echogenicity within normal limits. No mass or hydronephrosis visualized. Cortical - medullary distinction is maintained. There is fullness of the right renal collecting system likely representing mild hydronephrosis (images 16 and 18). A hydroureter however was not visualized. Left Kidney: Length: 10 cm. Echogenicity within normal limits. No mass or hydronephrosis visualized. Bladder: Physiologically distended. Only a left-sided ureteral jet could be confirmed despite several minutes of imaging per discussion with ultrasound technologist. IMPRESSION: Mild right-sided  hydronephrosis. Absent right ureteral jet suggests continued obstruction. Patent left ureter. Electronically Signed   By: Ashley Royalty M.D.   On: 03/25/2016 17:51   Ir Fluoro Guide Cv Line Right  Result Date: 03/31/2016 CLINICAL DATA:  Metastatic urothelial carcinoma. Poor peripheral IV access. Right IJ access was achieved for IVC filter placement. EXAM: RIGHT IJ CENTRAL VENOUS CATHETER PLACEMENT UNDER ULTRASOUND AND FLUOROSCOPIC GUIDANCE FLUOROSCOPY TIME:  Less than 6 seconds TECHNIQUE: Right IJ venous access had been obtained under sterile conditions for IVC filter placement as dictated separately. Immediately following the conclusion of the procedure, the IVC filter delivery sheath was exchanged over the [facility] wire for the micropuncture transitional dilator, through which a 018 wire was then advanced. Over this, a peel-away sheath was placed. A 5 French dual-lumen power injectable PICC catheter trimmed to appropriate length was then advanced, positioned with the tip at the cavoatrial junction. Spot chest radiograph shows good positioning and no pneumothorax. Catheter was flushed and sutured externally with 0-Prolene sutures. Patient tolerated the procedure well. COMPLICATIONS: None immediate IMPRESSION: 1. Technically successful right IJ double lumen power injectable central venous catheter placement. Electronically Signed   By: Lucrezia Europe M.D.   On: 03/31/2016 15:57   Ir Removal Tun Cv Cath W/o Fl  Result Date: 04/06/2016 CLINICAL DATA:  Sepsis and need to remove indwelling Port-A-Cath originally placed on 07/26/2015. EXAM: REMOVAL OF IMPLANTED TUNNELED PORT-A-CATH MEDICATIONS: None PROCEDURE: The right chest Port-A-Cath site was prepped with chlorhexidine. A sterile gown and gloves were worn during the procedure. Local anesthesia was provided with 1% lidocaine. An incision was made overlying the Port-A-Cath with a #15 scalpel. Utilizing sharp and blunt  dissection, the Port-A-Cath was removed. Portable  cautery was utilized. Retention sutures were removed. The pocked was irrigated with sterile saline. Wound closure was performed with subcutaneous 3-0 Monocryl, subcuticular 4-0 Vicryl and Dermabond. The entire Port-A-Cath was removed successfully. IMPRESSION: Removal of implanted Port-A-Cath utilizing sharp and blunt dissection. The procedure was uncomplicated. Electronically Signed   By: Aletta Edouard M.D.   On: 04/06/2016 07:50   Dg Chest Port 1 View  Result Date: 04/02/2016 CLINICAL DATA:  Sepsis EXAM: PORTABLE CHEST 1 VIEW COMPARISON:  Chest radiograph February 28, 2016 and chest CT March 30, 2016 FINDINGS: There is no edema or consolidation. There is mild cardiomegaly with pulmonary vascularity within normal limits. Port-A-Cath tip is at the cavoatrial junction. Right central catheter tip in right atrium. No pneumothorax. Pacemaker leads are attached to the right atrium and right ventricle. There is atherosclerotic calcification in the aorta. No adenopathy. IMPRESSION: Catheter positions as described without pneumothorax. No edema or consolidation. Stable cardiac prominence. There is aortic atherosclerosis. Electronically Signed   By: Lowella Grip III M.D.   On: 04/02/2016 08:53    Microbiology: Recent Results (from the past 240 hour(s))  Culture, blood (routine x 2)     Status: Abnormal   Collection Time: 04/01/16  5:27 PM  Result Value Ref Range Status   Specimen Description BLOOD RIGHT ANTECUBITAL  Final   Special Requests BOTTLES DRAWN AEROBIC ONLY 5CC  Final   Culture  Setup Time   Final    GRAM POSITIVE COCCI IN CLUSTERS AEROBIC BOTTLE ONLY Organism ID to follow CRITICAL RESULT CALLED TO, READ BACK BY AND VERIFIED WITH: M [facility] AT [id number] 04/02/16 BY L BENFIELD    Culture METHICILLIN RESISTANT STAPHYLOCOCCUS AUREUS (A)  Final   Report Status 04/04/2016 FINAL  Final   Organism ID, Bacteria METHICILLIN RESISTANT STAPHYLOCOCCUS AUREUS  Final      Susceptibility    Methicillin resistant staphylococcus aureus - MIC*    CIPROFLOXACIN >=8 RESISTANT Resistant     ERYTHROMYCIN >=8 RESISTANT Resistant     GENTAMICIN <=0.5 SENSITIVE Sensitive     OXACILLIN >=4 RESISTANT Resistant     TETRACYCLINE <=1 SENSITIVE Sensitive     VANCOMYCIN 1 SENSITIVE Sensitive     TRIMETH/SULFA <=10 SENSITIVE Sensitive     CLINDAMYCIN <=0.25 SENSITIVE Sensitive     RIFAMPIN <=0.5 SENSITIVE Sensitive     Inducible Clindamycin NEGATIVE Sensitive     * METHICILLIN RESISTANT STAPHYLOCOCCUS AUREUS  Blood Culture ID Panel (Reflexed)     Status: Abnormal   Collection Time: 04/01/16  5:27 PM  Result Value Ref Range Status   Enterococcus species NOT DETECTED NOT DETECTED Final   Listeria monocytogenes NOT DETECTED NOT DETECTED Final   Staphylococcus species DETECTED (A) NOT DETECTED Final    Comment: CRITICAL RESULT CALLED TO, READ BACK BY AND VERIFIED WITH: M MACCIA,PHARMD AT [id number] 04/02/16 BY L BENFIELD    Staphylococcus aureus DETECTED (A) NOT DETECTED Final    Comment: CRITICAL RESULT CALLED TO, READ BACK BY AND VERIFIED WITH: M MACCIA,PHARMD AT [id number] 04/02/16 BY L BENFIELD    Methicillin resistance DETECTED (A) NOT DETECTED Final    Comment: CRITICAL RESULT CALLED TO, READ BACK BY AND VERIFIED WITH: M MACCIA,PHARMD AT [id number] 04/02/16 BY L BENFIELD    Streptococcus species NOT DETECTED NOT DETECTED Final   Streptococcus agalactiae NOT DETECTED NOT DETECTED Final   Streptococcus pneumoniae NOT DETECTED NOT DETECTED Final   Streptococcus pyogenes NOT DETECTED NOT DETECTED Final   Acinetobacter baumannii NOT  DETECTED NOT DETECTED Final   Enterobacteriaceae species NOT DETECTED NOT DETECTED Final   Enterobacter cloacae complex NOT DETECTED NOT DETECTED Final   Escherichia coli NOT DETECTED NOT DETECTED Final   Klebsiella oxytoca NOT DETECTED NOT DETECTED Final   Klebsiella pneumoniae NOT DETECTED NOT DETECTED Final   Proteus species NOT DETECTED NOT DETECTED Final   Serratia  marcescens NOT DETECTED NOT DETECTED Final   Haemophilus influenzae NOT DETECTED NOT DETECTED Final   Neisseria meningitidis NOT DETECTED NOT DETECTED Final   Pseudomonas aeruginosa NOT DETECTED NOT DETECTED Final   Candida albicans NOT DETECTED NOT DETECTED Final   Candida glabrata NOT DETECTED NOT DETECTED Final   Candida krusei NOT DETECTED NOT DETECTED Final   Candida parapsilosis NOT DETECTED NOT DETECTED Final   Candida tropicalis NOT DETECTED NOT DETECTED Final  Culture, blood (routine x 2)     Status: Abnormal   Collection Time: 04/01/16  5:40 PM  Result Value Ref Range Status   Specimen Description BLOOD LEFT ARM  Final   Special Requests IN PEDIATRIC BOTTLE 4CC  Final   Culture  Setup Time   Final    GRAM POSITIVE COCCI IN CLUSTERS IN PEDIATRIC BOTTLE CRITICAL VALUE NOTED.  VALUE IS CONSISTENT WITH PREVIOUSLY REPORTED AND CALLED VALUE.    Culture (A)  Final    STAPHYLOCOCCUS AUREUS SUSCEPTIBILITIES PERFORMED ON PREVIOUS CULTURE WITHIN THE LAST 5 DAYS.    Report Status 04/04/2016 FINAL  Final  Culture, blood (x 2)     Status: None   Collection Time: 04/02/16  8:19 AM  Result Value Ref Range Status   Specimen Description BLOOD RIGHT ARM  Final   Special Requests BOTTLES DRAWN AEROBIC AND ANAEROBIC 10CC EACH  Final   Culture NO GROWTH 5 DAYS  Final   Report Status 04/07/2016 FINAL  Final  Culture, blood (x 2)     Status: Abnormal   Collection Time: 04/02/16  8:29 AM  Result Value Ref Range Status   Specimen Description BLOOD RIGHT HAND  Final   Special Requests IN PEDIATRIC BOTTLE 4CC  Final   Culture  Setup Time   Final    AEROBIC BOTTLE ONLY GRAM POSITIVE COCCI IN CLUSTERS CRITICAL VALUE NOTED.  VALUE IS CONSISTENT WITH PREVIOUSLY REPORTED AND CALLED VALUE.    Culture (A)  Final    STAPHYLOCOCCUS AUREUS SUSCEPTIBILITIES PERFORMED ON PREVIOUS CULTURE WITHIN THE LAST 5 DAYS.    Report Status 04/04/2016 FINAL  Final  Cath Tip Culture     Status: Abnormal    Collection Time: 04/03/16  5:20 PM  Result Value Ref Range Status   Specimen Description CATH TIP CHEST PORTA CATH  Final   Special Requests NONE  Final   Culture METHICILLIN RESISTANT STAPHYLOCOCCUS AUREUS (A)  Final   Report Status 04/06/2016 FINAL  Final   Organism ID, Bacteria METHICILLIN RESISTANT STAPHYLOCOCCUS AUREUS  Final      Susceptibility   Methicillin resistant staphylococcus aureus - MIC*    CIPROFLOXACIN >=8 RESISTANT Resistant     ERYTHROMYCIN >=8 RESISTANT Resistant     GENTAMICIN <=0.5 SENSITIVE Sensitive     OXACILLIN >=4 RESISTANT Resistant     TETRACYCLINE <=1 SENSITIVE Sensitive     VANCOMYCIN 1 SENSITIVE Sensitive     TRIMETH/SULFA <=10 SENSITIVE Sensitive     CLINDAMYCIN <=0.25 SENSITIVE Sensitive     RIFAMPIN <=0.5 SENSITIVE Sensitive     Inducible Clindamycin NEGATIVE Sensitive     * METHICILLIN RESISTANT STAPHYLOCOCCUS AUREUS  Culture, blood (routine  x 2)     Status: None   Collection Time: 04/05/16  6:47 AM  Result Value Ref Range Status   Specimen Description BLOOD RIGHT ASSIST CONTROL  Final   Special Requests BOTTLES DRAWN AEROBIC ONLY 5CC  Final   Culture NO GROWTH 5 DAYS  Final   Report Status 04/10/2016 FINAL  Final  Culture, blood (routine x 2)     Status: None   Collection Time: 04/05/16  6:47 AM  Result Value Ref Range Status   Specimen Description BLOOD RIGHT ARM  Final   Special Requests BOTTLES DRAWN AEROBIC ONLY 5CC  Final   Culture NO GROWTH 5 DAYS  Final   Report Status 04/10/2016 FINAL  Final     Labs: Basic Metabolic Panel:  Recent Labs Lab 04/04/16 0627 04/05/16 0647 04/06/16 0546 04/07/16 0147 04/08/16 0733 04/09/16 0507  NA 132* 133*  --  132* 132* 133*  K 4.1 4.0  --  4.5 4.6 4.8  CL 98* 97*  --  95* 95* 96*  CO2 25 26  --  27 30 30   GLUCOSE 105* 231*  --  255* 234* 299*  BUN 28* 31*  --  38* 40* 39*  CREATININE 0.87 0.92  --  0.89 0.89 0.94  CALCIUM 7.9* 8.0*  --  8.3* 8.7* 8.5*  MG  --   --  1.6*  --  1.7  --     Liver Function Tests:  Recent Labs Lab 04/04/16 0627  AST 15  ALT 12*  ALKPHOS 80  BILITOT 1.2  PROT 4.2*  ALBUMIN 1.6*   No results for input(s): LIPASE, AMYLASE in the last 168 hours. No results for input(s): AMMONIA in the last 168 hours. CBC:  Recent Labs Lab 04/04/16 0627 04/05/16 0647 04/06/16 0546 04/07/16 0147 04/08/16 0733 04/09/16 0507  WBC 3.8* 3.7* 3.8* 3.9* 4.1 4.3  NEUTROABS 3.1 3.2 3.0 3.2 3.3  --   HGB 10.7* 12.1* 11.5* 11.2* 11.7* 11.1*  HCT 30.7* 34.1* 32.5* 31.0* 33.3* 32.2*  MCV 102.0* 101.2* 100.3* 99.4 100.6* 101.3*  PLT 44* 43* 29* 21* 15* 9*   Cardiac Enzymes:  Recent Labs Lab 04/07/16 0147 04/07/16 0556 04/07/16 1541 04/09/16 0507  CKTOTAL  --   --   --  10*  TROPONINI 0.04* 0.04* 0.09*  --    BNP: BNP (last 3 results) No results for input(s): BNP in the last 8760 hours.  ProBNP (last 3 results) No results for input(s): PROBNP in the last 8760 hours.  CBG:  Recent Labs Lab 04/08/16 1726 04/08/16 2239 04/09/16 0841 04/09/16 1154 04/09/16 1711  GLUCAP 264* 241* 304* 267* 213*       SignedDomenic Polite MD.  [facility] 04/10/2016, 11:27 AM

## 2016-04-15 ENCOUNTER — Other Ambulatory Visit: Payer: Medicare Other

## 2016-04-17 ENCOUNTER — Ambulatory Visit: Payer: Medicare Other | Admitting: Oncology

## 2016-04-22 DEATH — deceased

## 2016-04-29 ENCOUNTER — Ambulatory Visit: Payer: Medicare Other | Admitting: Neurology

## 2017-04-14 IMAGING — CT CT HEAD WO/W CM
3 of 6 series · 14 of 47 positions shown, 16 images · IV contrast (iopamidol)
Comparison: CT of head with contrast dated 02/28/2016

CLINICAL DATA: 86 y/o  M; weakness.

EXAM:
CT HEAD WITHOUT AND WITH CONTRAST
TECHNIQUE: Contiguous axial images were obtained from the base of the skull
through the vertex without and with intravenous contrast
CONTRAST:  50mL C56NAU-8BB IOPAMIDOL (C56NAU-8BB) INJECTION
61%<Contrast>50mL C56NAU-8BB IOPAMIDOL (C56NAU-8BB) INJECTION 61%

[Series 2: head wo 5.0 h30s · axial · 0.45mm/px · z∈[-103,+37]mm · 8 of 34 slices shown, 10 images]
[im 3/34  brain]
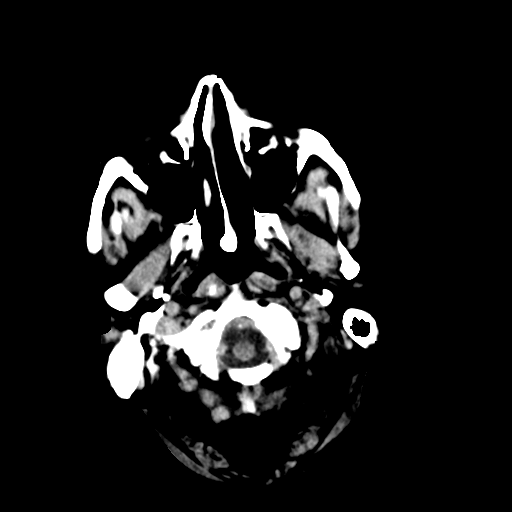
[im 3/34  bone]
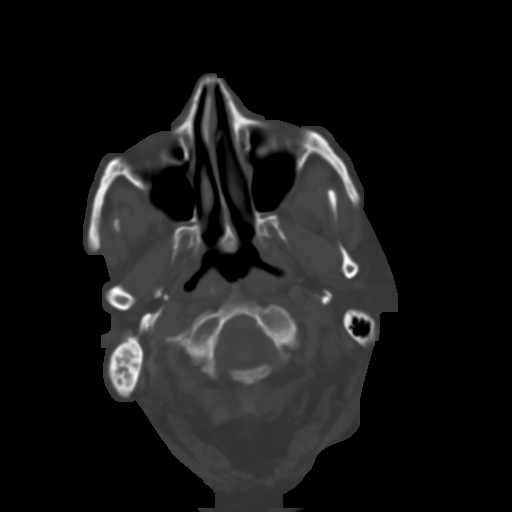
[im 8/34  brain]
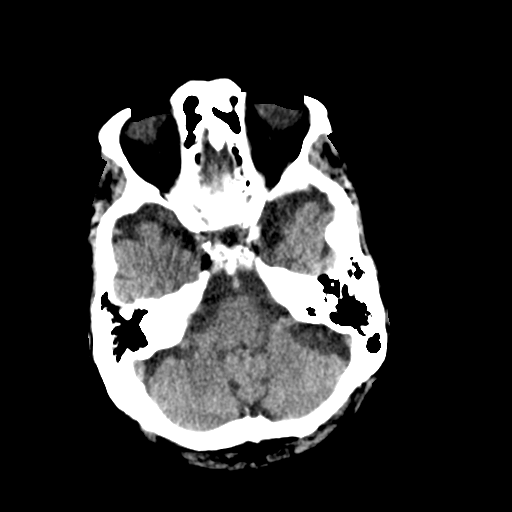
[im 12/34  brain]
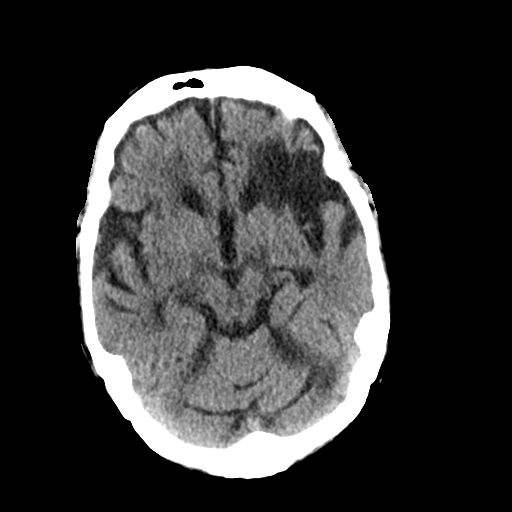
[im 15/34  brain]
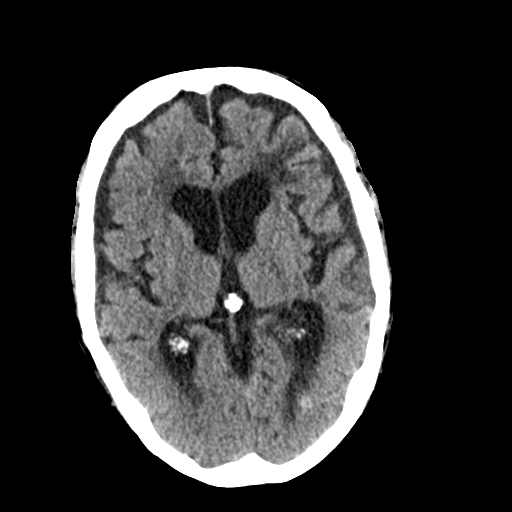
[im 19/34  brain]
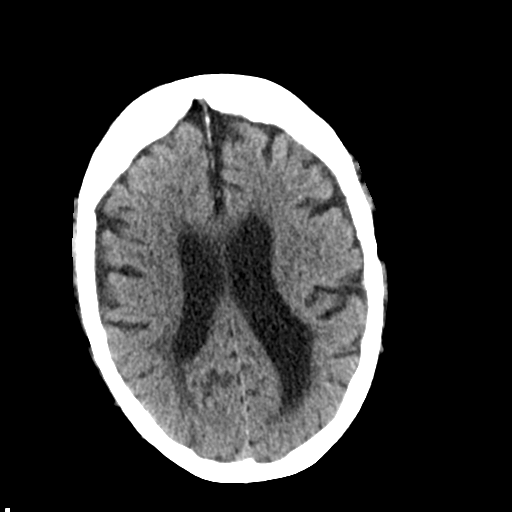
[im 19/34  bone]
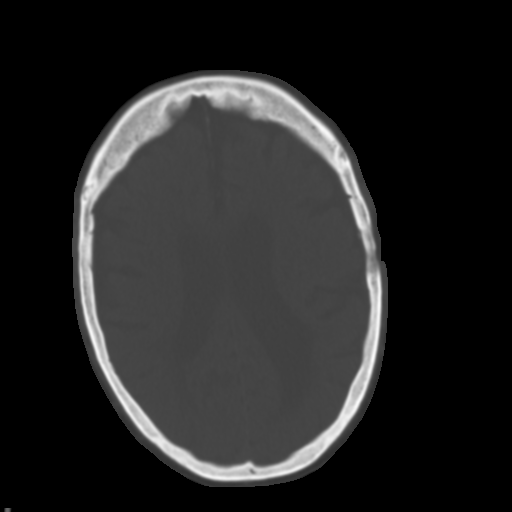
[im 22/34  brain]
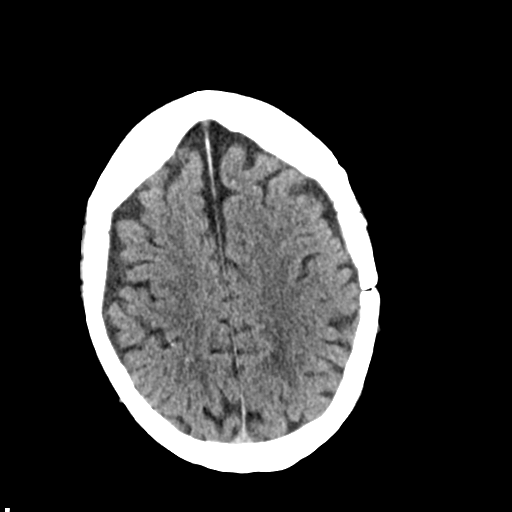
[im 26/34  brain]
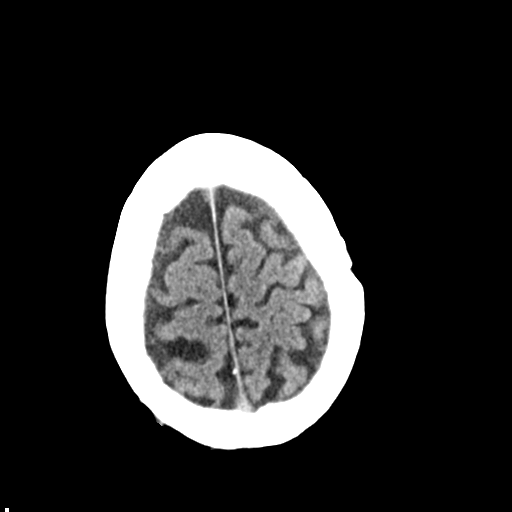
[im 31/34  brain]
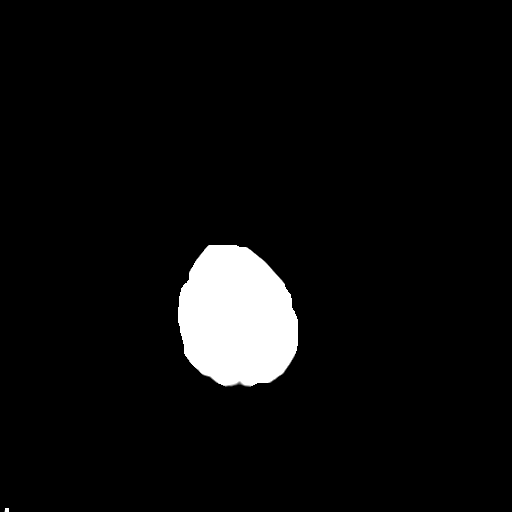

[Series 6: head with 3.0 mpr · coronal · 0.33mm/px · 3 of 64 slices shown (1 of 2)]
[im 13/64  brain]
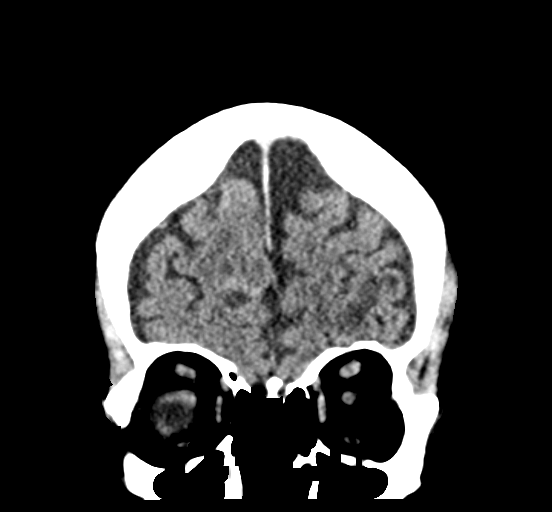
[im 26/64  brain]
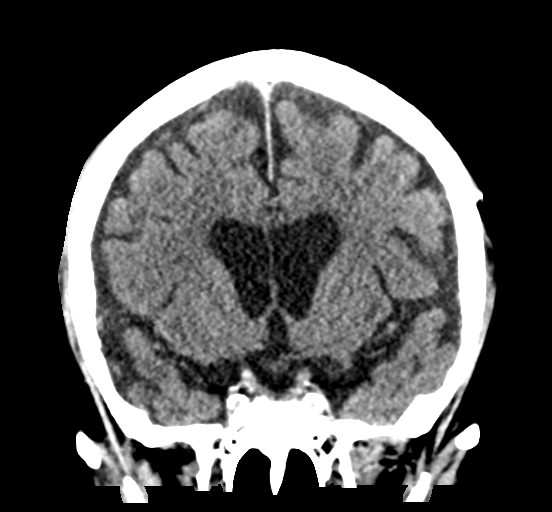
[im 38/64  brain]
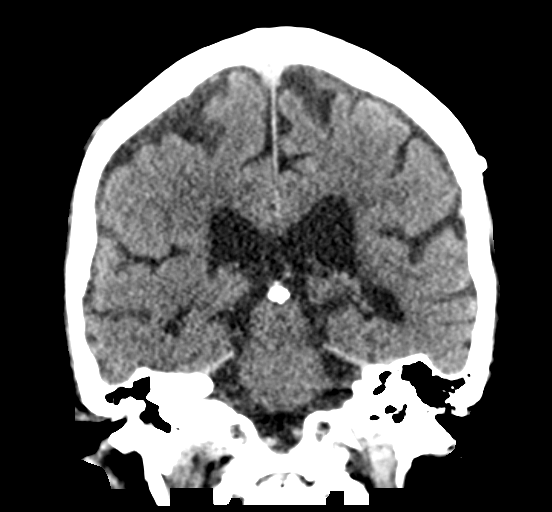

[Series 7: head with 3.0 mpr · sagittal · 0.32mm/px · 3 of 65 slices shown (2 of 2)]
[im 17/65  brain]
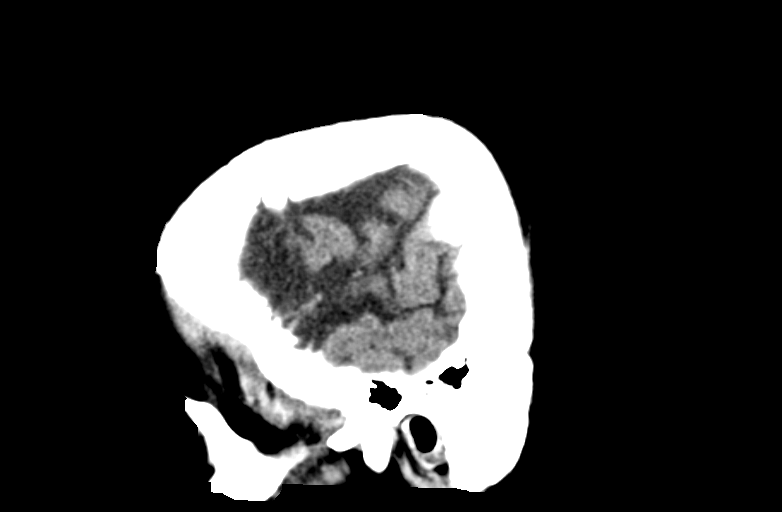
[im 33/65  brain]
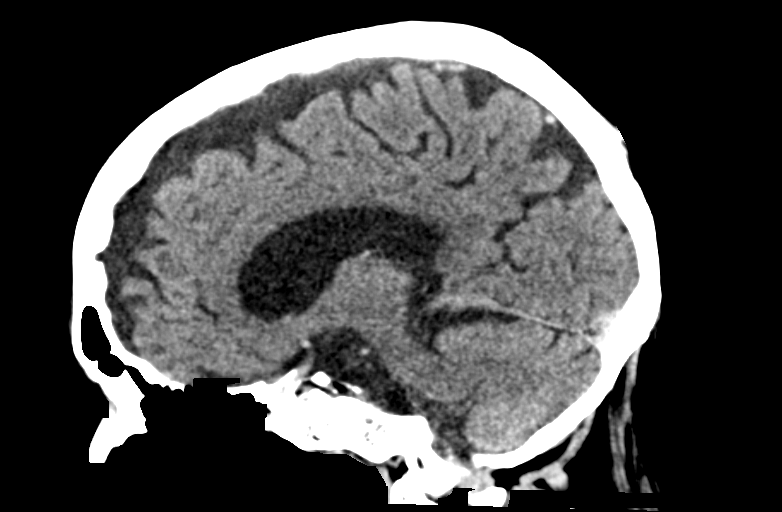
[im 49/65  brain]
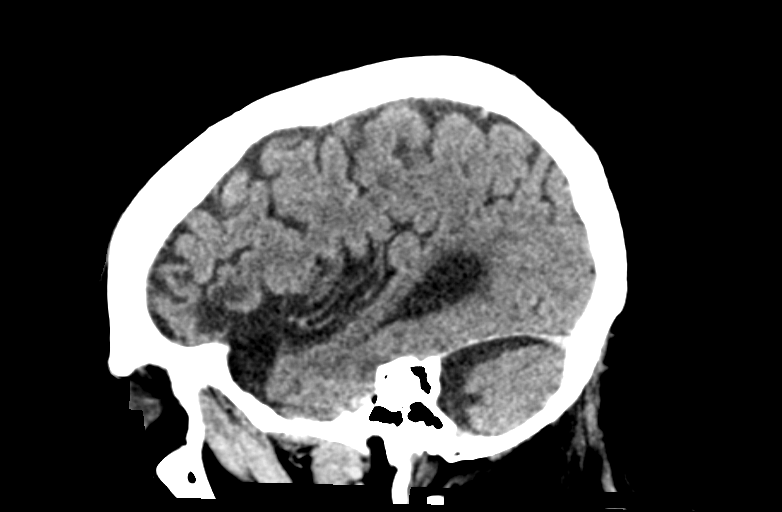

[14 of 47 positions shown; findings below may reference images not displayed]

FINDINGS: Brain: Stable left inferior frontal encephalomalacia. Stable
hyperdense focus in the left occipital lobe measuring 10 mm (series
2, image 14). Postcontrast administration the lesion appears to
enhance only on the coronal reconstruction (compare series 8, image
33 with series 6, image 54).

Additionally, there is a probable new focus of enhancement in the
right paramedian frontal lobe (series 4, image 15)

No evidence for large territory infarct or new intracranial
hemorrhage. Stable mild to moderate chronic microvascular ischemic
changes and parenchymal volume loss.

Vascular: Calcific atherosclerosis of carotid siphons.

Skull: Postsurgical changes related to a left frontal craniotomy are
stable. All

Sinuses/Orbits: Right mastoid opacification with sclerosis
compatible with sequelae of chronic otomastoiditis, stable. Mild
paranasal sinus disease with sphenoid and ethmoid patchy
opacification. Left mastoid air cells are clear. Orbits are
unremarkable.

Other: Right parotidectomy.
IMPRESSION: 1. No evidence of large territory infarct or new intracranial
hemorrhage.
2. Probable new focus of enhancement within the right paramedian
frontal lobe. Differential includes metastatic disease or subacute
infarction.
3. Previous identified by right frontal parafalcine hyperdense focus
is no longer appreciated, possibly having represented an area of
hemorrhage.
4. Left occipital hyperdense focus with uncertain enhancement.
Stable in size in comparison with prior studies.

By: Atoce Anelli M.D.

## 2018-06-06 IMAGING — US US RENAL
1 series · 14 of 25 positions shown · non-contrast
Comparison: CT 03/01/2016

CLINICAL DATA: Hydronephrosis due to obstruction of the ureter.
Patient with history of urothelial carcinoma treated with
chemotherapy most recently November 27, 2015. Noted to have mild right
hydroureteronephrosis to the level of the proximal right ureter on
prior CT of 03/01/2016.

EXAM:
RENAL / URINARY TRACT ULTRASOUND COMPLETE

[Series 1: us renal · 0.23mm/px · 14 of 65 slices shown]
[im 1/65]
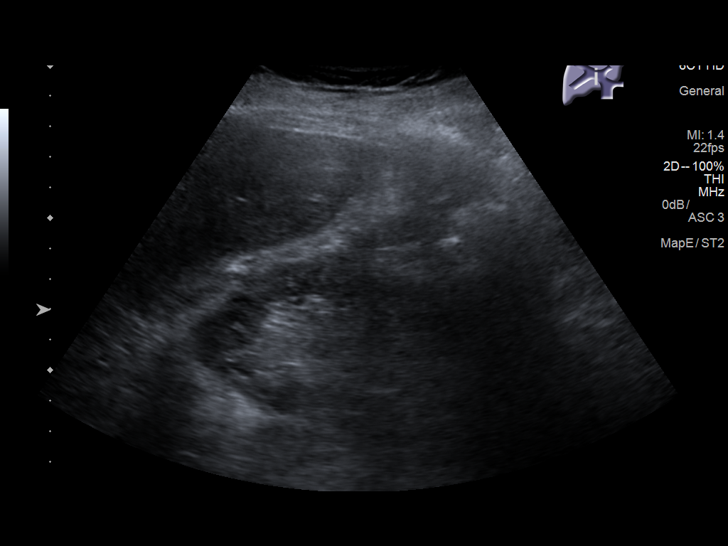
[im 6/65]
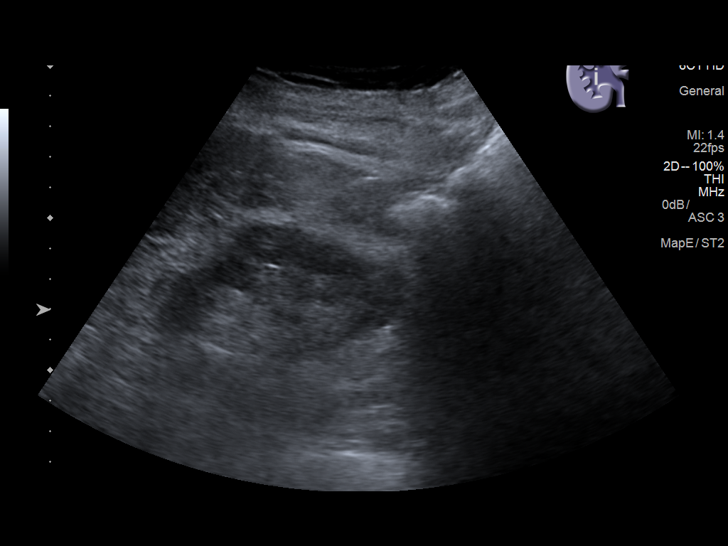
[im 11/65]
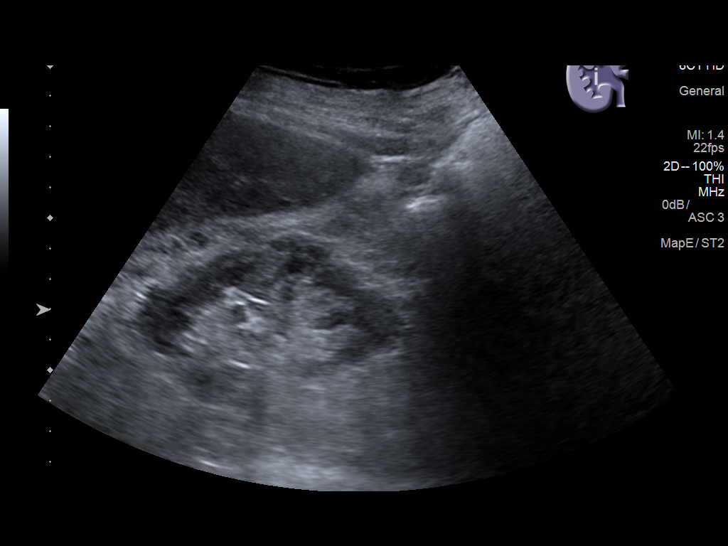
[im 17/65]
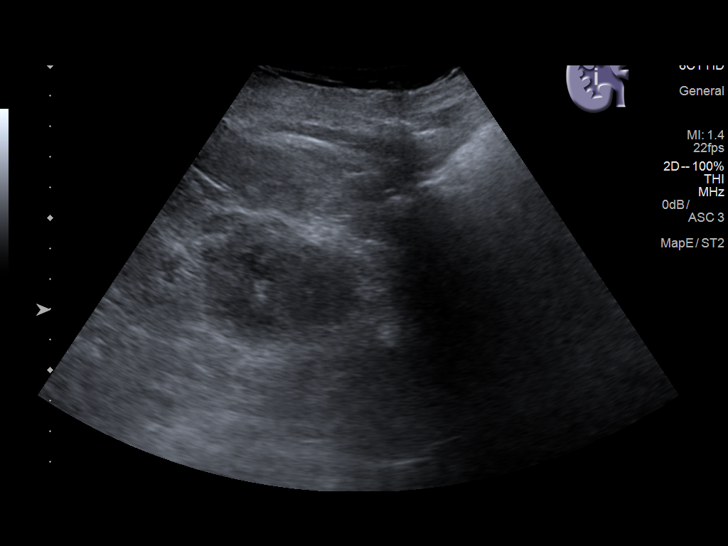
[im 22/65]
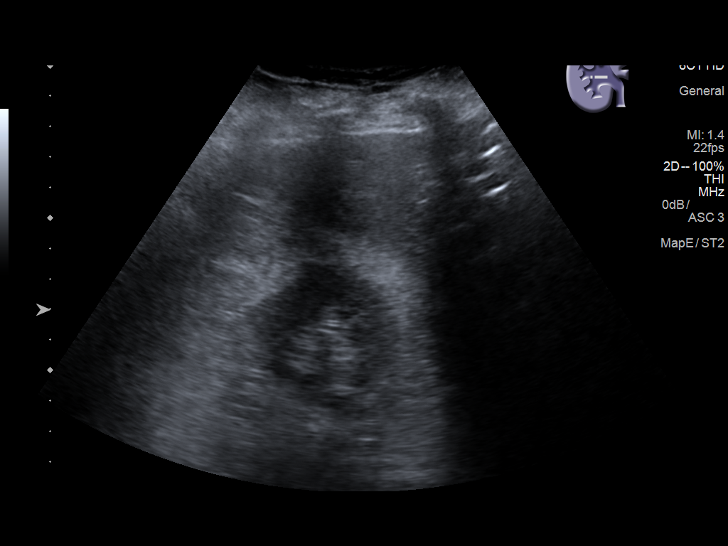
[im 25/65]
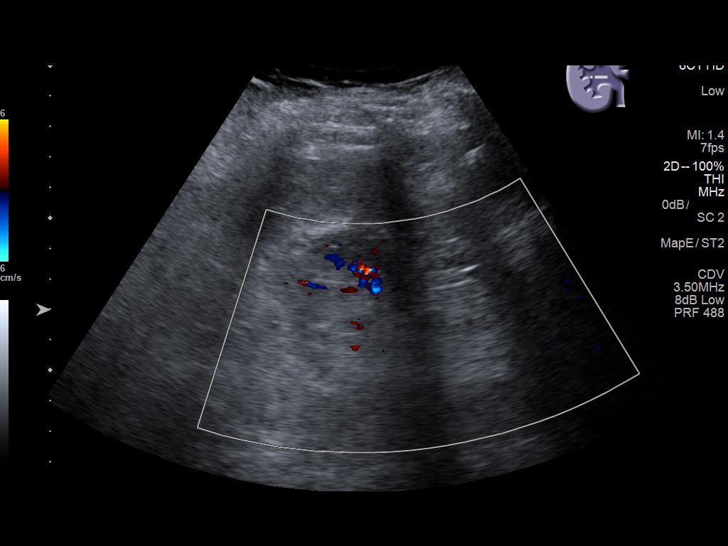
[im 30/65]
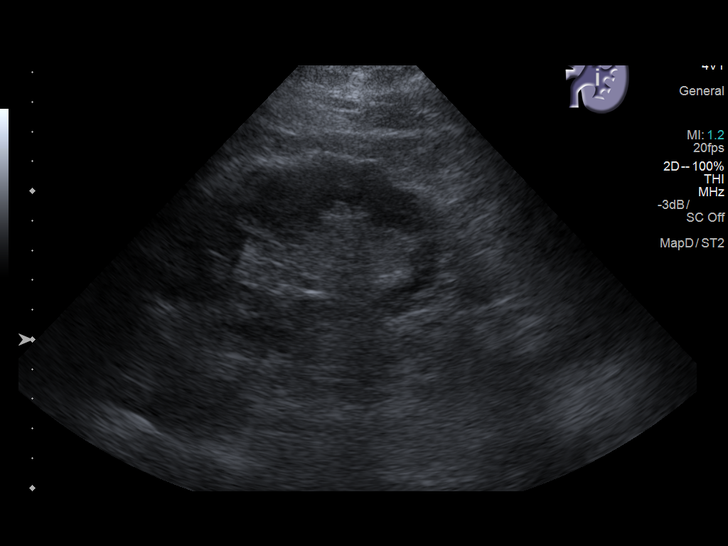
[im 35/65]
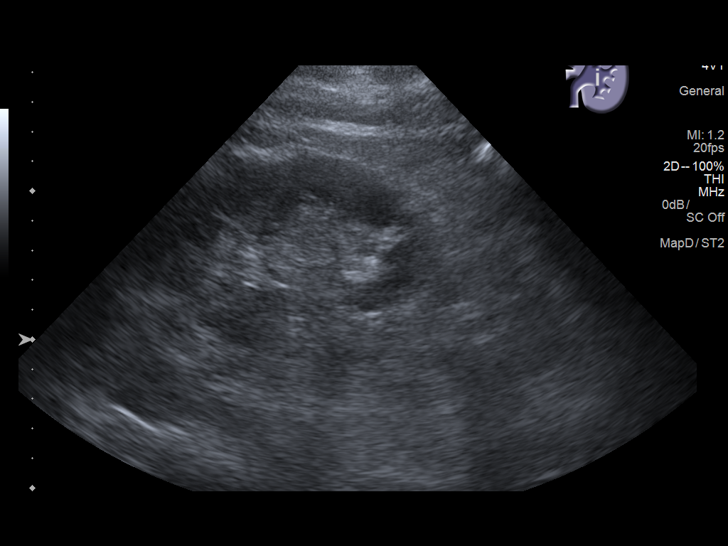
[im 41/65]
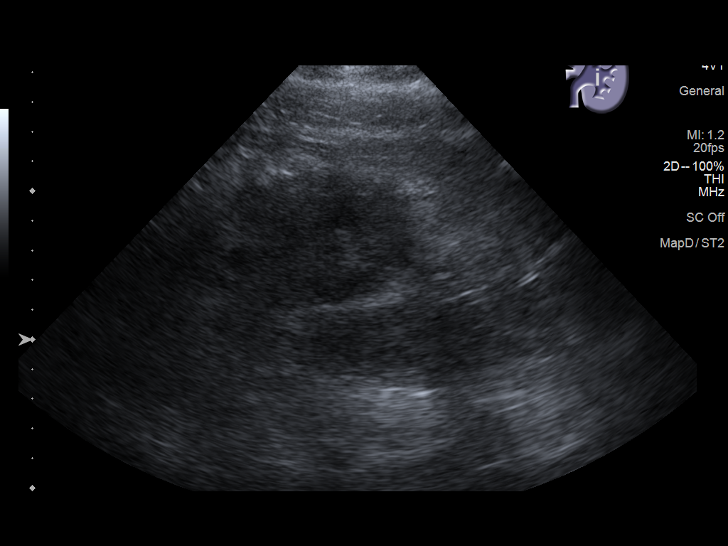
[im 43/65]
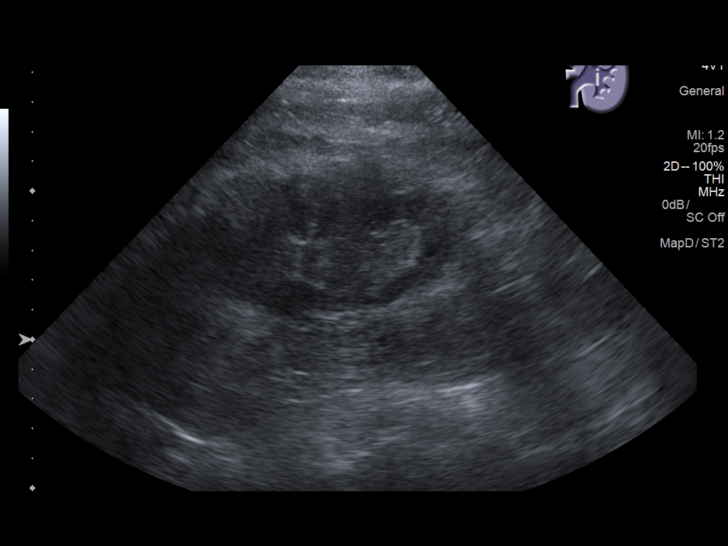
[im 49/65]
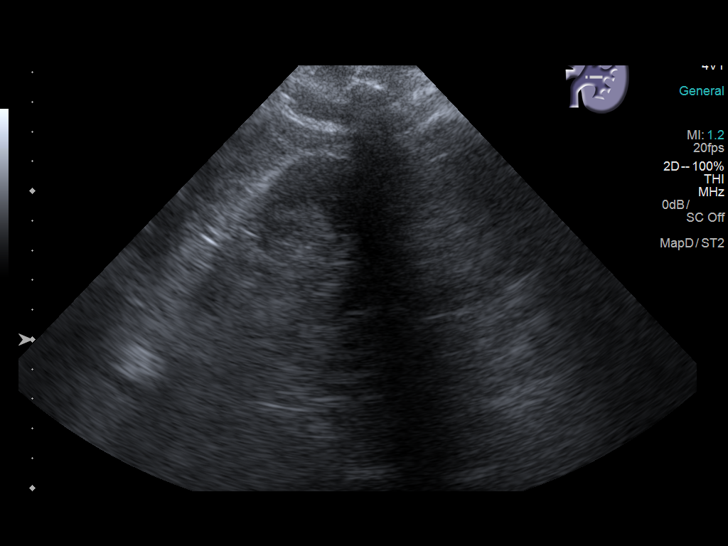
[im 54/65]
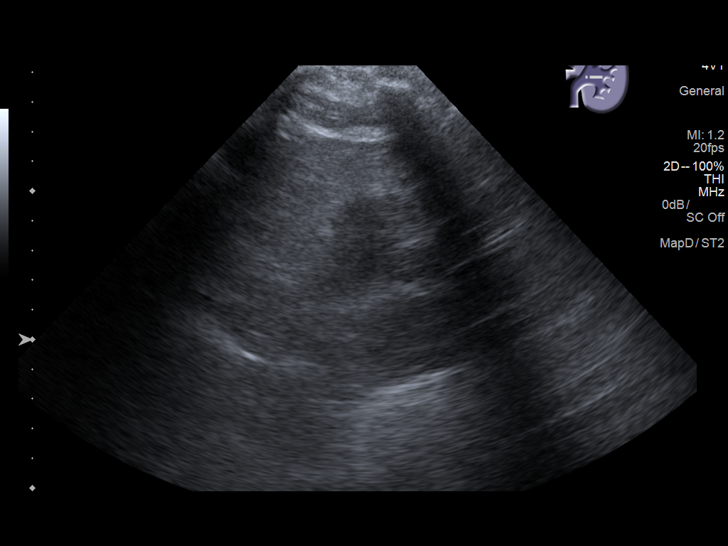
[im 59/65]
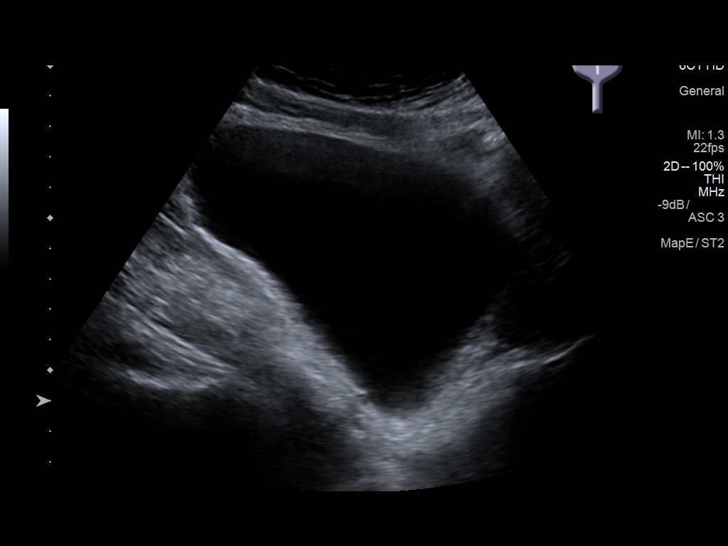
[im 65/65]
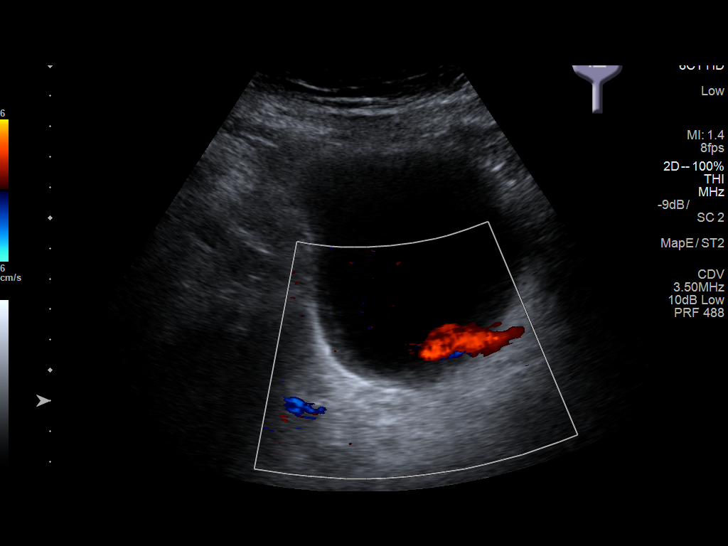

[14 of 25 positions shown; findings below may reference images not displayed]

FINDINGS: Right Kidney:

Length: 9.7 cm. Echogenicity within normal limits. No mass or
hydronephrosis visualized. Cortical - medullary distinction is
maintained. There is fullness of the right renal collecting system
likely representing mild hydronephrosis (images 16 and 18). A
hydroureter however was not visualized.

Left Kidney:

Length: 10 cm. Echogenicity within normal limits. No mass or
hydronephrosis visualized.

Bladder:

Physiologically distended. Only a left-sided ureteral jet could be
confirmed despite several minutes of imaging per discussion with
ultrasound technologist.
IMPRESSION: Mild right-sided hydronephrosis. Absent right ureteral jet suggests
continued obstruction. Patent left ureter.

## 2018-07-22 ENCOUNTER — Encounter: Payer: Self-pay | Admitting: Oncology

## 2018-07-22 ENCOUNTER — Other Ambulatory Visit: Payer: Self-pay | Admitting: Nurse Practitioner

## 2018-11-03 ENCOUNTER — Telehealth: Payer: Self-pay | Admitting: Cardiology

## 2018-11-03 NOTE — Telephone Encounter (Signed)
Spoke w/ pt daughter and she informed me that passed away in 2016/10/05.
